# Patient Record
Sex: Female | Born: 1945
Health system: Southern US, Community
[De-identification: ages and names within clinical notes are randomized; demographics above are authoritative.]

## PROBLEM LIST (undated history)

## (undated) ENCOUNTER — Emergency Department (HOSPITAL_COMMUNITY): Admission: EM | Payer: Medicare PPO | Source: Home / Self Care

## (undated) DIAGNOSIS — J342 Deviated nasal septum: Secondary | ICD-10-CM

## (undated) DIAGNOSIS — M1990 Unspecified osteoarthritis, unspecified site: Secondary | ICD-10-CM

## (undated) DIAGNOSIS — Z8679 Personal history of other diseases of the circulatory system: Secondary | ICD-10-CM

## (undated) DIAGNOSIS — I10 Essential (primary) hypertension: Secondary | ICD-10-CM

## (undated) DIAGNOSIS — Z803 Family history of malignant neoplasm of breast: Secondary | ICD-10-CM

## (undated) DIAGNOSIS — M1710 Unilateral primary osteoarthritis, unspecified knee: Secondary | ICD-10-CM

## (undated) DIAGNOSIS — G4733 Obstructive sleep apnea (adult) (pediatric): Secondary | ICD-10-CM

## (undated) DIAGNOSIS — Z8049 Family history of malignant neoplasm of other genital organs: Secondary | ICD-10-CM

## (undated) DIAGNOSIS — E039 Hypothyroidism, unspecified: Secondary | ICD-10-CM

## (undated) DIAGNOSIS — H9319 Tinnitus, unspecified ear: Secondary | ICD-10-CM

## (undated) DIAGNOSIS — R002 Palpitations: Secondary | ICD-10-CM

## (undated) DIAGNOSIS — I517 Cardiomegaly: Secondary | ICD-10-CM

## (undated) DIAGNOSIS — E785 Hyperlipidemia, unspecified: Secondary | ICD-10-CM

## (undated) DIAGNOSIS — G63 Polyneuropathy in diseases classified elsewhere: Principal | ICD-10-CM

## (undated) DIAGNOSIS — I341 Nonrheumatic mitral (valve) prolapse: Secondary | ICD-10-CM

## (undated) DIAGNOSIS — G629 Polyneuropathy, unspecified: Secondary | ICD-10-CM

## (undated) DIAGNOSIS — Z800 Family history of malignant neoplasm of digestive organs: Secondary | ICD-10-CM

## (undated) DIAGNOSIS — Z8042 Family history of malignant neoplasm of prostate: Secondary | ICD-10-CM

## (undated) DIAGNOSIS — Z8052 Family history of malignant neoplasm of bladder: Secondary | ICD-10-CM

## (undated) DIAGNOSIS — E119 Type 2 diabetes mellitus without complications: Secondary | ICD-10-CM

## (undated) DIAGNOSIS — E0500 Thyrotoxicosis with diffuse goiter without thyrotoxic crisis or storm: Secondary | ICD-10-CM

## (undated) DIAGNOSIS — F419 Anxiety disorder, unspecified: Secondary | ICD-10-CM

## (undated) DIAGNOSIS — Z808 Family history of malignant neoplasm of other organs or systems: Secondary | ICD-10-CM

## (undated) DIAGNOSIS — G479 Sleep disorder, unspecified: Secondary | ICD-10-CM

## (undated) DIAGNOSIS — IMO0001 Reserved for inherently not codable concepts without codable children: Secondary | ICD-10-CM

## (undated) DIAGNOSIS — R0789 Other chest pain: Secondary | ICD-10-CM

## (undated) DIAGNOSIS — E1140 Type 2 diabetes mellitus with diabetic neuropathy, unspecified: Secondary | ICD-10-CM

## (undated) DIAGNOSIS — Z8041 Family history of malignant neoplasm of ovary: Secondary | ICD-10-CM

## (undated) DIAGNOSIS — Z8719 Personal history of other diseases of the digestive system: Secondary | ICD-10-CM

## (undated) DIAGNOSIS — M179 Osteoarthritis of knee, unspecified: Secondary | ICD-10-CM

## (undated) HISTORY — DX: Family history of malignant neoplasm of prostate: Z80.42

## (undated) HISTORY — DX: Deviated nasal septum: J34.2

## (undated) HISTORY — DX: Hyperlipidemia, unspecified: E78.5

## (undated) HISTORY — DX: Thyrotoxicosis with diffuse goiter without thyrotoxic crisis or storm: E05.00

## (undated) HISTORY — DX: Essential (primary) hypertension: I10

## (undated) HISTORY — DX: Tinnitus, unspecified ear: H93.19

## (undated) HISTORY — DX: Personal history of other diseases of the circulatory system: Z86.79

## (undated) HISTORY — DX: Family history of malignant neoplasm of other organs or systems: Z80.8

## (undated) HISTORY — DX: Palpitations: R00.2

## (undated) HISTORY — DX: Sleep disorder, unspecified: G47.9

## (undated) HISTORY — PX: APPENDECTOMY: SHX54

## (undated) HISTORY — DX: Other chest pain: R07.89

## (undated) HISTORY — DX: Unilateral primary osteoarthritis, unspecified knee: M17.10

## (undated) HISTORY — DX: Hypothyroidism, unspecified: E03.9

## (undated) HISTORY — DX: Unspecified osteoarthritis, unspecified site: M19.90

## (undated) HISTORY — DX: Family history of malignant neoplasm of breast: Z80.3

## (undated) HISTORY — PX: OTHER SURGICAL HISTORY: SHX169

## (undated) HISTORY — PX: CARPAL TUNNEL RELEASE: SHX101

## (undated) HISTORY — PX: ABDOMINAL HYSTERECTOMY: SHX81

## (undated) HISTORY — DX: Obstructive sleep apnea (adult) (pediatric): G47.33

## (undated) HISTORY — DX: Osteoarthritis of knee, unspecified: M17.9

## (undated) HISTORY — DX: Type 2 diabetes mellitus with diabetic neuropathy, unspecified: E11.40

## (undated) HISTORY — DX: Family history of malignant neoplasm of ovary: Z80.41

## (undated) HISTORY — DX: Polyneuropathy, unspecified: G62.9

## (undated) HISTORY — DX: Nonrheumatic mitral (valve) prolapse: I34.1

## (undated) HISTORY — DX: Reserved for inherently not codable concepts without codable children: IMO0001

## (undated) HISTORY — DX: Type 2 diabetes mellitus without complications: E11.9

## (undated) HISTORY — DX: Anxiety disorder, unspecified: F41.9

## (undated) HISTORY — DX: Personal history of other diseases of the digestive system: Z87.19

## (undated) HISTORY — DX: Cardiomegaly: I51.7

## (undated) HISTORY — DX: Polyneuropathy in diseases classified elsewhere: G63

## (undated) HISTORY — DX: Family history of malignant neoplasm of digestive organs: Z80.0

## (undated) HISTORY — DX: Family history of malignant neoplasm of other genital organs: Z80.49

## (undated) HISTORY — DX: Family history of malignant neoplasm of bladder: Z80.52

---
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
## 1987-08-14 HISTORY — PX: APPENDECTOMY: SHX54

## 1998-06-23 ENCOUNTER — Other Ambulatory Visit: Admission: RE | Admit: 1998-06-23 | Discharge: 1998-06-23 | Payer: Self-pay | Admitting: *Deleted

## 1998-08-13 HISTORY — PX: ABDOMINAL HYSTERECTOMY: SHX81

## 1998-10-07 ENCOUNTER — Other Ambulatory Visit: Admission: RE | Admit: 1998-10-07 | Discharge: 1998-10-07 | Payer: Self-pay | Admitting: *Deleted

## 1998-12-07 ENCOUNTER — Observation Stay (HOSPITAL_COMMUNITY): Admission: RE | Admit: 1998-12-07 | Discharge: 1998-12-08 | Payer: Self-pay | Admitting: *Deleted

## 1999-06-26 ENCOUNTER — Other Ambulatory Visit: Admission: RE | Admit: 1999-06-26 | Discharge: 1999-06-26 | Payer: Self-pay | Admitting: *Deleted

## 2000-01-26 ENCOUNTER — Ambulatory Visit (HOSPITAL_COMMUNITY): Admission: RE | Admit: 2000-01-26 | Discharge: 2000-01-26 | Payer: Self-pay | Admitting: Gastroenterology

## 2000-07-22 ENCOUNTER — Other Ambulatory Visit: Admission: RE | Admit: 2000-07-22 | Discharge: 2000-07-22 | Payer: Self-pay | Admitting: *Deleted

## 2000-08-13 HISTORY — PX: CARPAL TUNNEL RELEASE: SHX101

## 2001-08-12 ENCOUNTER — Other Ambulatory Visit: Admission: RE | Admit: 2001-08-12 | Discharge: 2001-08-12 | Payer: Self-pay | Admitting: *Deleted

## 2002-10-06 ENCOUNTER — Other Ambulatory Visit: Admission: RE | Admit: 2002-10-06 | Discharge: 2002-10-06 | Payer: Self-pay | Admitting: *Deleted

## 2004-03-15 ENCOUNTER — Other Ambulatory Visit: Admission: RE | Admit: 2004-03-15 | Discharge: 2004-03-15 | Payer: Self-pay | Admitting: *Deleted

## 2005-03-22 ENCOUNTER — Other Ambulatory Visit: Admission: RE | Admit: 2005-03-22 | Discharge: 2005-03-22 | Payer: Self-pay | Admitting: *Deleted

## 2006-04-02 ENCOUNTER — Other Ambulatory Visit: Admission: RE | Admit: 2006-04-02 | Discharge: 2006-04-02 | Payer: Self-pay | Admitting: *Deleted

## 2007-04-15 ENCOUNTER — Other Ambulatory Visit: Admission: RE | Admit: 2007-04-15 | Discharge: 2007-04-15 | Payer: Self-pay | Admitting: *Deleted

## 2007-12-18 ENCOUNTER — Encounter: Admission: RE | Admit: 2007-12-18 | Discharge: 2007-12-18 | Payer: Self-pay | Admitting: Neurosurgery

## 2008-05-04 ENCOUNTER — Other Ambulatory Visit: Admission: RE | Admit: 2008-05-04 | Discharge: 2008-05-04 | Payer: Self-pay | Admitting: Gynecology

## 2008-09-07 ENCOUNTER — Emergency Department (HOSPITAL_COMMUNITY): Admission: EM | Admit: 2008-09-07 | Discharge: 2008-09-07 | Payer: Self-pay | Admitting: Emergency Medicine

## 2009-05-11 ENCOUNTER — Encounter: Admission: RE | Admit: 2009-05-11 | Discharge: 2009-05-11 | Payer: Self-pay | Admitting: Cardiology

## 2009-08-13 DIAGNOSIS — IMO0001 Reserved for inherently not codable concepts without codable children: Secondary | ICD-10-CM

## 2009-08-13 HISTORY — DX: Reserved for inherently not codable concepts without codable children: IMO0001

## 2009-11-01 HISTORY — PX: TRANSTHORACIC ECHOCARDIOGRAM: SHX275

## 2009-11-09 ENCOUNTER — Encounter: Payer: Self-pay | Admitting: Cardiology

## 2009-11-09 HISTORY — PX: CARDIOVASCULAR STRESS TEST: SHX262

## 2009-11-10 ENCOUNTER — Encounter: Admission: RE | Admit: 2009-11-10 | Discharge: 2009-11-10 | Payer: Self-pay | Admitting: Cardiology

## 2009-11-23 ENCOUNTER — Encounter: Admission: RE | Admit: 2009-11-23 | Discharge: 2009-11-23 | Payer: Self-pay | Admitting: General Surgery

## 2010-05-23 ENCOUNTER — Ambulatory Visit: Payer: Self-pay | Admitting: Cardiology

## 2010-09-26 ENCOUNTER — Other Ambulatory Visit (INDEPENDENT_AMBULATORY_CARE_PROVIDER_SITE_OTHER): Payer: BC Managed Care – PPO

## 2010-09-26 DIAGNOSIS — E7800 Pure hypercholesterolemia, unspecified: Secondary | ICD-10-CM

## 2010-09-26 DIAGNOSIS — R079 Chest pain, unspecified: Secondary | ICD-10-CM

## 2010-09-26 DIAGNOSIS — I10 Essential (primary) hypertension: Secondary | ICD-10-CM

## 2010-09-29 ENCOUNTER — Ambulatory Visit (INDEPENDENT_AMBULATORY_CARE_PROVIDER_SITE_OTHER): Payer: BC Managed Care – PPO | Admitting: Cardiology

## 2010-09-29 DIAGNOSIS — E7800 Pure hypercholesterolemia, unspecified: Secondary | ICD-10-CM

## 2010-09-29 DIAGNOSIS — Z79899 Other long term (current) drug therapy: Secondary | ICD-10-CM

## 2010-09-29 DIAGNOSIS — E039 Hypothyroidism, unspecified: Secondary | ICD-10-CM

## 2010-11-08 ENCOUNTER — Other Ambulatory Visit: Payer: Self-pay | Admitting: Cardiology

## 2010-11-08 DIAGNOSIS — F419 Anxiety disorder, unspecified: Secondary | ICD-10-CM

## 2010-11-09 NOTE — Telephone Encounter (Signed)
escribe request  

## 2010-11-09 NOTE — Telephone Encounter (Signed)
[facility] °

## 2010-12-29 NOTE — Procedures (Signed)
[facility]  Patient:    Yvonne [name], Yvonne [name]                     MRN: [id number] Proc. Date: 01/26/00 Adm. Date:  [id number] Disc. Date: [id number] Attending:  Louie Bun CC:         Clovis Pu Patty Sermons, M.D.                           Procedure Report  PROCEDURE:  Colonoscopy.  INDICATION FOR PROCEDURE:  Recent onset of rectal bleeding in a 65 year old patient with no prior colon screening.  DESCRIPTION OF PROCEDURE:  The patient was placed in the left lateral decubitus position and placed on the pulse monitor with continuous low flow oxygen delivered by nasal cannula. She was sedated with 70 mg IV Demerol and 7 mg IV Versed. The Olympus video colonoscope was inserted into the rectum and advanced to the cecum, confirmed by transillumination of McBurneys point and visualization of the ileocecal valve and appendiceal orifice. The prep was excellent. The cecum and ascending colon appeared normal. Within the transverse colon, there was an apparent polyp approximately 1 x 0.5 cm in diameter with overlying mucosa appearing similar to this surrounding mucosa and not clearly an adenoma by endoscopic inspection. It was fulgurated by hot biopsy. The remainder of the transverse, descending, sigmoid and rectum appeared normal with no further masses, polyps, diverticula or other mucosal abnormalities. Retroflexed view of the anus revealed no obviously enlarged internal hemorrhoids. The colonoscope was then withdrawn and the patient returned to the recovery room in stable condition. The patient tolerated the procedure well and there were no immediate complications.  IMPRESSION:  Small transverse colon polyp otherwise normal colonoscopy.  PLAN:  Await histology for determination of interval and method for future colon screening. DD:  01/26/00 TD:  01/30/00 Job: [id number]

## 2011-01-19 ENCOUNTER — Other Ambulatory Visit: Payer: Self-pay | Admitting: *Deleted

## 2011-01-19 DIAGNOSIS — I10 Essential (primary) hypertension: Secondary | ICD-10-CM

## 2011-01-19 DIAGNOSIS — E039 Hypothyroidism, unspecified: Secondary | ICD-10-CM

## 2011-01-19 DIAGNOSIS — E785 Hyperlipidemia, unspecified: Secondary | ICD-10-CM

## 2011-01-19 DIAGNOSIS — Z79899 Other long term (current) drug therapy: Secondary | ICD-10-CM

## 2011-01-30 ENCOUNTER — Other Ambulatory Visit (INDEPENDENT_AMBULATORY_CARE_PROVIDER_SITE_OTHER): Payer: BC Managed Care – PPO | Admitting: *Deleted

## 2011-01-30 DIAGNOSIS — E559 Vitamin D deficiency, unspecified: Secondary | ICD-10-CM

## 2011-01-30 DIAGNOSIS — E039 Hypothyroidism, unspecified: Secondary | ICD-10-CM

## 2011-01-30 DIAGNOSIS — I10 Essential (primary) hypertension: Secondary | ICD-10-CM

## 2011-01-30 DIAGNOSIS — E785 Hyperlipidemia, unspecified: Secondary | ICD-10-CM

## 2011-01-30 DIAGNOSIS — Z79899 Other long term (current) drug therapy: Secondary | ICD-10-CM

## 2011-01-30 LAB — BASIC METABOLIC PANEL
BUN: 10 mg/dL (ref 6–23)
CO2: 28 mEq/L (ref 19–32)
Calcium: 9.7 mg/dL (ref 8.4–10.5)
Chloride: 111 mEq/L (ref 96–112)
Creatinine, Ser: 0.7 mg/dL (ref 0.4–1.2)
Glucose, Bld: 99 mg/dL (ref 70–99)
Potassium: 4.2 mEq/L (ref 3.5–5.1)
Sodium: 146 mEq/L — ABNORMAL HIGH (ref 135–145)

## 2011-01-30 LAB — LIPID PANEL
HDL: 37.8 mg/dL — ABNORMAL LOW (ref >39.00)
VLDL: 21 mg/dL (ref 0.0–40.0)

## 2011-01-30 LAB — TSH: TSH: 0.35 u[IU]/mL (ref 0.35–5.50)

## 2011-01-31 ENCOUNTER — Encounter: Payer: Self-pay | Admitting: Cardiology

## 2011-02-05 ENCOUNTER — Ambulatory Visit (INDEPENDENT_AMBULATORY_CARE_PROVIDER_SITE_OTHER): Payer: BC Managed Care – PPO | Admitting: Cardiology

## 2011-02-05 ENCOUNTER — Encounter: Payer: Self-pay | Admitting: Cardiology

## 2011-02-05 DIAGNOSIS — E785 Hyperlipidemia, unspecified: Secondary | ICD-10-CM | POA: Insufficient documentation

## 2011-02-05 DIAGNOSIS — E039 Hypothyroidism, unspecified: Secondary | ICD-10-CM | POA: Insufficient documentation

## 2011-02-05 DIAGNOSIS — R002 Palpitations: Secondary | ICD-10-CM | POA: Insufficient documentation

## 2011-02-05 DIAGNOSIS — I119 Hypertensive heart disease without heart failure: Secondary | ICD-10-CM

## 2011-02-05 NOTE — Assessment & Plan Note (Signed)
The patient has a history of familial dyslipidemia.  She is presently on Crestor 10 mg daily.  She has not been experiencing anyMyalgias from the Crestor

## 2011-02-05 NOTE — Assessment & Plan Note (Signed)
The patient has a past history of palpitations.  She initially was felt to have possible mitral valve prolapse for many years but  A more recent echo has not confirmed this

## 2011-02-05 NOTE — Assessment & Plan Note (Signed)
The patient has a history of essential hypertension.  Her echocardiogram has shown left ventricular hypertrophy with diastolic dysfunction.  Her last echo was 11/01/09.  She has not been expressing any chest pain or shortness of breath.  She is walking regularly for exercise.  Her weight is down 2 pounds since last visit.

## 2011-02-05 NOTE — Progress Notes (Signed)
Yvonne [name] Date of Birth:  [birth year] [facility] / [facility] [address]   [address] [place], [place]  [address] [phone]           Fax   [phone]  History of Present Illness: This pleasant 65 year old woman is seen for a scheduled followup office visit.  She has a history of severe hypertension.  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 Evlyn Kanner.  She's had a history of occasional chest pressure but her most recent nuclear stress test on 11/09/09 showed no ischemia and her ejection fraction was 75%.  Her last echocardiogram on 11/01/09 showed normal left ventricular systolic function but she did have diastolic dysfunction as well as mild aortic sclerosis and trace mitral regurgitation and mild LVH.  Current Outpatient Prescriptions  Medication Sig Dispense Refill  . ALPRAZolam (XANAX) 0.25 MG tablet TAKE ONE TABLET BY MOUTH EVERY 8 HOURS AS NEEDED  90 tablet  3  . aspirin 81 MG tablet Take 81 mg by mouth daily.        Marland Kitchen CALCIUM-MAGNESIUM-ZINC PO Take by mouth 2 (two) times daily.        . Cholecalciferol (VITAMIN D) 2000 UNITS tablet Take 2,000 Units by mouth daily.        . fish oil-omega-3 fatty acids 1000 MG capsule Take 1,200 mg by mouth 2 (two) times daily.        . furosemide (LASIX) 20 MG tablet Take 20 mg by mouth as needed.        . gabapentin (NEURONTIN) 300 MG capsule Take 300 mg by mouth at bedtime.        . hydrochlorothiazide (,MICROZIDE/HYDRODIURIL,) 12.5 MG capsule Take 12.5 mg by mouth daily.        Marland Kitchen levothyroxine (SYNTHROID, LEVOTHROID) 112 MCG tablet Take 112 mcg by mouth daily. Taking one x 6 days and 1/2 one day      . metoprolol succinate (TOPROL-XL) 25 MG 24 hr tablet Take 12.5 mg by mouth daily.        . potassium chloride SA (K-DUR,KLOR-CON) 20 MEQ tablet Take 10 mEq by mouth  daily.        . rosuvastatin (CRESTOR) 10 MG tablet Take 10 mg by mouth daily.        Marland Kitchen DISCONTD: psyllium (METAMUCIL) 58.6 % powder Take 1 packet by mouth daily.          Allergies  Allergen Reactions  . Tobramycin     Patient Active Problem List  Diagnoses  . Benign hypertensive heart disease without heart failure  . Hyperlipidemia  . Hypothyroidism  . Palpitations    History  Smoking status  . Former Smoker  . Quit date: 01/30/1986  Smokeless tobacco  . Not on file    History  Alcohol Use No    Family History  Problem Relation Age of Onset  . Hypertension Mother   . Heart disease Mother   . Hypertension Father   . Heart disease Father   . Arthritis Father   . Diabetes Father     Review of Systems: Constitutional: no fever chills diaphoresis or fatigue or change in weight.  Head and neck: no hearing loss, no epistaxis, no photophobia or visual disturbance. Respiratory: No cough, shortness of breath or wheezing. Cardiovascular: No chest pain peripheral edema,  palpitations. Gastrointestinal: No abdominal distention, no abdominal pain, no change in bowel habits hematochezia or melena. Genitourinary: No dysuria, no frequency, no urgency, no nocturia. Musculoskeletal:No arthralgias, no back pain, no gait disturbance or myalgias. Neurological: No dizziness, no headaches, no numbness, no seizures, no syncope, no weakness, no tremors. Hematologic: No lymphadenopathy, no easy bruising. Psychiatric: No confusion, no hallucinations, no sleep disturbance.    Physical Exam: Filed Vitals:   02/05/11 1610  BP: 124/76  Pulse: 66  The general appearance feels a well-developed well-nourished woman in no distress.Pupils equal and reactive.   Extraocular Movements are full.  There is no scleral icterus.  The mouth and pharynx are normal.  The neck is supple.  The carotids reveal no bruits.  The jugular venous pressure is normal.  The thyroid is not enlarged.  There is no  lymphadenopathy.The chest is clear to percussion and auscultation. There are no rales or rhonchi. Expansion of the chest is symmetrical.  The breasts reveal no mass.The precordium is quiet.  The first heart sound is normal.  The second heart sound is physiologically split.  There is no murmur gallop rub or click.  There is no abnormal lift or heave. The rhythm is regular.  Careful auscultation reveals no click.The abdomen is soft and nontender. Bowel sounds are normal. The liver and spleen are not enlarged. There Are no abdominal masses. There are no bruits.The pedal pulses are good.  There is no phlebitis or edema.  There is no cyanosis or clubbing.Strength is normal and symmetrical in all extremities.  There is no lateralizing weakness.  There are no sensory deficits.The skin is warm and [name].  There is no rash.   Assessment / Plan: Continue same medication.  Decrease her daily vitamin D to just 1000 units a day.  Recheck in 4 months for followup office visit and lab work.

## 2011-03-15 ENCOUNTER — Other Ambulatory Visit: Payer: Self-pay | Admitting: *Deleted

## 2011-03-15 DIAGNOSIS — I10 Essential (primary) hypertension: Secondary | ICD-10-CM

## 2011-03-15 MED ORDER — POTASSIUM CHLORIDE CRYS ER 20 MEQ PO TBCR: 10.0000 meq | EXTENDED_RELEASE_TABLET | Freq: Every day | ORAL | Status: DC

## 2011-03-15 NOTE — Telephone Encounter (Signed)
Refilled potassium to [facility]

## 2011-03-26 ENCOUNTER — Other Ambulatory Visit: Payer: Self-pay | Admitting: *Deleted

## 2011-03-26 DIAGNOSIS — E7800 Pure hypercholesterolemia, unspecified: Secondary | ICD-10-CM

## 2011-04-03 ENCOUNTER — Other Ambulatory Visit: Payer: Self-pay | Admitting: Cardiology

## 2011-04-03 DIAGNOSIS — I119 Hypertensive heart disease without heart failure: Secondary | ICD-10-CM

## 2011-05-08 ENCOUNTER — Other Ambulatory Visit: Payer: Self-pay | Admitting: *Deleted

## 2011-05-08 DIAGNOSIS — E039 Hypothyroidism, unspecified: Secondary | ICD-10-CM

## 2011-05-08 NOTE — Progress Notes (Signed)
Requested by Dr Adrian Prince, [facility]

## 2011-05-11 ENCOUNTER — Encounter: Payer: Self-pay | Admitting: Cardiology

## 2011-05-22 ENCOUNTER — Other Ambulatory Visit: Payer: BC Managed Care – PPO | Admitting: *Deleted

## 2011-05-24 ENCOUNTER — Other Ambulatory Visit (INDEPENDENT_AMBULATORY_CARE_PROVIDER_SITE_OTHER): Payer: BC Managed Care – PPO

## 2011-05-24 DIAGNOSIS — E7800 Pure hypercholesterolemia, unspecified: Secondary | ICD-10-CM

## 2011-05-24 DIAGNOSIS — E039 Hypothyroidism, unspecified: Secondary | ICD-10-CM

## 2011-05-24 LAB — HEPATIC FUNCTION PANEL
ALT: 28 U/L (ref 0–35)
AST: 24 U/L (ref 0–37)
Albumin: 4.5 g/dL (ref 3.5–5.2)
Alkaline Phosphatase: 59 U/L (ref 39–117)
Bilirubin, Direct: 0.1 mg/dL (ref 0.0–0.3)

## 2011-05-24 LAB — LIPID PANEL
Triglycerides: 140 mg/dL (ref 0.0–149.0)
VLDL: 28 mg/dL (ref 0.0–40.0)

## 2011-05-24 LAB — BASIC METABOLIC PANEL
BUN: 13 mg/dL (ref 6–23)
Calcium: 9.4 mg/dL (ref 8.4–10.5)
Chloride: 101 mEq/L (ref 96–112)
Creatinine, Ser: 0.6 mg/dL (ref 0.4–1.2)
GFR: 118.61 mL/min (ref >60.00)

## 2011-05-24 LAB — TSH: TSH: 0.74 u[IU]/mL (ref 0.35–5.50)

## 2011-05-28 ENCOUNTER — Encounter: Payer: Self-pay | Admitting: Cardiology

## 2011-05-28 ENCOUNTER — Ambulatory Visit (INDEPENDENT_AMBULATORY_CARE_PROVIDER_SITE_OTHER): Payer: BC Managed Care – PPO | Admitting: Cardiology

## 2011-05-28 VITALS — BP 140/80 | HR 66 | Ht 67.0 in | Wt 171.0 lb

## 2011-05-28 DIAGNOSIS — I119 Hypertensive heart disease without heart failure: Secondary | ICD-10-CM

## 2011-05-28 DIAGNOSIS — E785 Hyperlipidemia, unspecified: Secondary | ICD-10-CM

## 2011-05-28 DIAGNOSIS — E039 Hypothyroidism, unspecified: Secondary | ICD-10-CM

## 2011-05-28 NOTE — Progress Notes (Signed)
Yvonne [name] Date of Birth:  09-26-1945 [facility] / [facility] [address]   [address] [place], [place]  [address] [phone]           Fax   [phone]  History of Present Illness: This pleasant 65 year old , Caucasian female is seen for a scheduled 3 month followup office visit.  She has a complex past medical history.  She's had a history of severe hypertension.  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  She has a history of hypothyroidism.  She's had a history of atypical chest pain and has had several nuclear stress test.  Most recently 11/09/09, which showed no ischemia.  Her ejection fraction is 75%.  Her last echocardiogram on 11/01/09 shows normal systolic function, but diastolic dysfunction, as well as mild aortic sclerosis, and mild LVH.  Patient has a history of postmenopausal symptoms.  She has a lot of hot flashes and night sweats.  She has been off hormone replacement therapy for the past 3 years.  She has had normal mammograms for the last several years and she is going to talk to her gynecologist about a trial of hormone replacement therapy for symptomatic relief of her hot flashes.  Is having a lot of problems with indigestion recently.  She's been taking calcium pills, which may be contributing to this.  Current Outpatient Prescriptions  Medication Sig Dispense Refill  . ALPRAZolam (XANAX) 0.25 MG tablet TAKE ONE TABLET BY MOUTH EVERY 8 HOURS AS NEEDED  90 tablet  3  . aspirin 81 MG tablet Take 81 mg by mouth daily.        Marland Kitchen CALCIUM-MAGNESIUM-ZINC PO Take by mouth 2 (two) times daily.        . Cholecalciferol (VITAMIN D) 2000 UNITS tablet Take 1,000 Units by mouth daily.       . fish oil-omega-3 fatty acids 1000 MG capsule Take 1,200 mg by mouth 2 (two) times daily.        . furosemide (LASIX) 20 MG tablet Take 1 tablet (20 mg total) by mouth daily as needed.  30  tablet  11  . gabapentin (NEURONTIN) 300 MG capsule Take 300 mg by mouth at bedtime.        . hydrochlorothiazide (,MICROZIDE/HYDRODIURIL,) 12.5 MG capsule Take 12.5 mg by mouth daily.        Marland Kitchen levothyroxine (SYNTHROID, LEVOTHROID) 112 MCG tablet Take 112 mcg by mouth daily. Taking one x 6 days and 1/2 one day      . metoprolol succinate (TOPROL-XL) 25 MG 24 hr tablet Take 12.5 mg by mouth daily.        . potassium chloride SA (K-DUR,KLOR-CON) 20 MEQ tablet Take 0.5 tablets (10 mEq total) by mouth daily.  34 tablet  11  . rosuvastatin (CRESTOR) 10 MG tablet Take 10 mg by mouth daily.          Allergies  Allergen Reactions  . Tobramycin     Patient Active Problem List  Diagnoses  . Benign hypertensive heart disease without heart failure  . Hyperlipidemia  . Hypothyroidism  . Palpitations    History  Smoking status  . Former Smoker  . Quit date: 01/30/1986  Smokeless tobacco  . Not on file    History  Alcohol Use No    Family History  Problem Relation Age of Onset  . Hypertension Mother   . Heart  disease Mother   . Hypertension Father   . Heart disease Father   . Arthritis Father   . Diabetes Father     Review of Systems: Constitutional: no fever chills diaphoresis or fatigue or change in weight.  Head and neck: no hearing loss, no epistaxis, no photophobia or visual disturbance. Respiratory: No cough, shortness of breath or wheezing. Cardiovascular: No chest pain peripheral edema, palpitations. Gastrointestinal: No abdominal distention, no abdominal pain, no change in bowel habits hematochezia or melena. Genitourinary: No dysuria, no frequency, no urgency, no nocturia. Musculoskeletal:No arthralgias, no back pain, no gait disturbance or myalgias. Neurological: No dizziness, no headaches, no numbness, no seizures, no syncope, no weakness, no tremors. Hematologic: No lymphadenopathy, no easy bruising. Psychiatric: No confusion, no hallucinations, no sleep  disturbance.    Physical Exam: Filed Vitals:   05/28/11 1554  BP: 140/80  Pulse: 66   general appearance reveals a well-developed, well-nourished, middle-age woman, in no distress.Pupils equal and reactive.   Extraocular Movements are full.  There is no scleral icterus.  The mouth and pharynx are normal.  The neck is supple.  The carotids reveal no bruits.  The jugular venous pressure is normal.  The thyroid is not enlarged.  There is no lymphadenopathy.  The chest is clear to percussion and auscultation. There are no rales or rhonchi. Expansion of the chest is symmetrical.  The precordium is quiet.  The first heart sound is normal.  The second heart sound is physiologically split.  There is no murmur gallop rub or click.  There is no abnormal lift or heave.  Do not hear any click or murmur today.The abdomen is soft and nontender. Bowel sounds are normal. The liver and spleen are not enlarged. There Are no abdominal masses. There are no bruits.  The pedal pulses are good.  There is no phlebitis or edema.  There is no cyanosis or clubbing. Strength is normal and symmetrical in all extremities.  There is no lateralizing weakness.  There are no sensory deficits.  The skin is warm and [name].  There is no rash.      Assessment / Plan:  Continue same medication, but needs to work harder on weight loss and exercise.  Recheck in 4 months for followup office visit and lab work.  She is also looking into getting a primary care provider, possibly at [facility]

## 2011-05-28 NOTE — Assessment & Plan Note (Signed)
Patient is clinically euthyroid.  Her recent TSH is normal.  She will continue same meds

## 2011-05-28 NOTE — Assessment & Plan Note (Signed)
She has been having some blood pressure spikes, with systolics up to 190.  These occur without warning.  They are not necessarily related to exertion or any specific emotional stress.  Her systolic hypertension does respond to resting and taking a Xanax.  She has not been getting any regular aerobic exercise and this may be contributing to her labile hypertension.  She has also gained 3 pounds since last visit

## 2011-05-28 NOTE — Assessment & Plan Note (Signed)
Patient has a history of hyperlipidemia.  We reviewed her recent labs, which were not as good, but reflect her uneven diet and her lack of exercise and her weight gain.

## 2011-05-28 NOTE — Patient Instructions (Addendum)
continue same dose of medications  Work harder on diet and exercise  Your physician wants you to follow-up in: 4 months You will receive a reminder letter in the mail two months in advance. If you don't receive a letter, please call our office to schedule the follow-up appointment.

## 2011-06-05 ENCOUNTER — Telehealth: Payer: Self-pay | Admitting: Cardiology

## 2011-06-05 NOTE — Telephone Encounter (Signed)
Should be ok

## 2011-06-05 NOTE — Telephone Encounter (Signed)
Advised ok  

## 2011-06-05 NOTE — Telephone Encounter (Signed)
Pt calling re Biotin, vitamin supplement for skin and hair,is it ok to take? pls  [phone]

## 2011-06-05 NOTE — Telephone Encounter (Signed)
Is this ok?

## 2011-06-20 ENCOUNTER — Telehealth: Payer: Self-pay | Admitting: Cardiology

## 2011-06-20 DIAGNOSIS — J069 Acute upper respiratory infection, unspecified: Secondary | ICD-10-CM

## 2011-06-20 MED ORDER — AZITHROMYCIN 250 MG PO TABS
ORAL_TABLET | ORAL | Status: AC
Start: 2011-06-20 — End: 2011-06-25

## 2011-06-20 NOTE — Telephone Encounter (Signed)
Pt calling c/o sinus and bronchial infection, since last Thursday and would like to speak to [place]. Please return pt call.

## 2011-06-20 NOTE — Telephone Encounter (Signed)
Add a Z-Pak as directed 

## 2011-06-20 NOTE — Telephone Encounter (Signed)
Head and chest congestion and is now in chest.  Started Friday.  Doesn't tolerate Mucinex.  Uses [facility] on [address].  Would like something called in please advise

## 2011-06-20 NOTE — Telephone Encounter (Signed)
Advised patient

## 2011-07-02 ENCOUNTER — Telehealth: Payer: Self-pay | Admitting: Cardiology

## 2011-07-02 NOTE — Telephone Encounter (Signed)
Advised patient and she will call when she gets results

## 2011-07-02 NOTE — Telephone Encounter (Signed)
New mess:  Just let OBGYN and blood count is 11.7.  They told her to call and let you know. Please call patient.

## 2011-07-02 NOTE — Telephone Encounter (Signed)
Is trying to get PCP and has not seen yet.  Last HGB was 13 last year.  Did get some stool cards to check.  Has not really noticed being more tired than normal.  Please advise

## 2011-07-02 NOTE — Telephone Encounter (Signed)
She should go ahead and do the stool cards and get the report back from her gynecologist as soon as possible about the stool cards.  If stool cards are negative she can take some over-the-counter iron therapy.  If stool cards are positive she will need GI evaluation.

## 2011-07-13 ENCOUNTER — Telehealth: Payer: Self-pay | Admitting: Cardiology

## 2011-07-13 NOTE — Telephone Encounter (Signed)
Advised and scheduled CBC

## 2011-07-13 NOTE — Telephone Encounter (Signed)
New Msg: Pt calling stating that pt did colon test and there was no significant blood in the stool and pt wants to know if MD wants pt to take iron if so how much and if MD wants to recheck pt hemoglobin. Please return pt call to discuss further.

## 2011-07-13 NOTE — Telephone Encounter (Signed)
She should take an over-the-counter iron pill once a day for the next 6 weeks and then come in for a followup CBC to check her hemoglobin again

## 2011-07-13 NOTE — Telephone Encounter (Signed)
Hgb at GYN 11.7 and last year 13.  Was advised to call back with this information. Please advise

## 2011-07-30 ENCOUNTER — Ambulatory Visit (INDEPENDENT_AMBULATORY_CARE_PROVIDER_SITE_OTHER): Payer: BC Managed Care – PPO

## 2011-07-30 DIAGNOSIS — J069 Acute upper respiratory infection, unspecified: Secondary | ICD-10-CM

## 2011-07-30 DIAGNOSIS — J40 Bronchitis, not specified as acute or chronic: Secondary | ICD-10-CM

## 2011-08-16 ENCOUNTER — Telehealth: Payer: Self-pay | Admitting: Cardiology

## 2011-08-16 NOTE — Telephone Encounter (Signed)
Patient saw dermatologist today and she does have hives.  He recommended her to take a Zyrtec daily and for 7 days after they are gone.  Also recommended Benedryl. Was advised to let  Dr. Patty Sermons know.  Will forward to him to see if he wants to make any medication changes

## 2011-08-16 NOTE — Telephone Encounter (Signed)
Left message for patient to call.

## 2011-08-16 NOTE — Telephone Encounter (Signed)
New Msg: Pt calling stating that she believes she has had an allergic reaction to a medication pt is taking. Pt said she has whelps all over her backside; pt said she noticed them this morning. Pt said she has not done anything differently other than starting to take a iron supplement (since November) and sleeping on a new pillow. Pt said backside itches and pt is nauseas. Please return pt call to discuss further.

## 2011-08-17 NOTE — Telephone Encounter (Signed)
Continue present medications. 

## 2011-08-17 NOTE — Telephone Encounter (Signed)
Left message to continue current medications and call us back with update

## 2011-08-24 ENCOUNTER — Other Ambulatory Visit: Payer: BC Managed Care – PPO | Admitting: *Deleted

## 2011-08-24 ENCOUNTER — Ambulatory Visit: Payer: BC Managed Care – PPO

## 2011-08-24 DIAGNOSIS — D649 Anemia, unspecified: Secondary | ICD-10-CM

## 2011-08-24 LAB — CBC WITH DIFFERENTIAL/PLATELET
Basophils Relative: 0.3 % (ref 0.0–3.0)
Eosinophils Absolute: 0.3 10*3/uL (ref 0.0–0.7)
Eosinophils Relative: 6 % — ABNORMAL HIGH (ref 0.0–5.0)
HCT: 40.2 % (ref 36.0–46.0)
Lymphs Abs: 1.6 10*3/uL (ref 0.7–4.0)
MCV: 82.6 fl (ref 78.0–100.0)
Monocytes Absolute: 0.6 10*3/uL (ref 0.1–1.0)
Monocytes Relative: 10.3 % (ref 3.0–12.0)
Neutro Abs: 3.2 10*3/uL (ref 1.4–7.7)
Platelets: 263 10*3/uL (ref 150.0–400.0)

## 2011-08-25 ENCOUNTER — Other Ambulatory Visit: Payer: Self-pay | Admitting: Cardiology

## 2011-08-28 ENCOUNTER — Telehealth: Payer: Self-pay | Admitting: *Deleted

## 2011-08-28 DIAGNOSIS — D649 Anemia, unspecified: Secondary | ICD-10-CM

## 2011-08-28 NOTE — Telephone Encounter (Signed)
Advised of labs. Patient wants to stop iron and recheck CBC at next ov. Order put in system.  She is also going to find out if Dr Evlyn Kanner wants t4 free or regular and same with t3

## 2011-08-28 NOTE — Telephone Encounter (Signed)
Message copied by Burnell Blanks on Tue Aug 28, 2011  2:36 PM ------      Message from: Cassell Clement      Created: Fri Aug 24, 2011  1:52 PM       Please report.  CBC normal.

## 2011-08-31 ENCOUNTER — Telehealth: Payer: Self-pay | Admitting: Cardiology

## 2011-08-31 NOTE — Telephone Encounter (Signed)
New Problem   Patient calling back to report info to nurse   "Dr. Evlyn Kanner just requests TSH"  Patient can be reached at hm# if there are any further questions

## 2011-08-31 NOTE — Telephone Encounter (Signed)
Discussed with patient and TSH ordered in system

## 2011-09-18 ENCOUNTER — Other Ambulatory Visit: Payer: Self-pay | Admitting: Cardiology

## 2011-10-03 ENCOUNTER — Ambulatory Visit: Payer: BC Managed Care – PPO | Admitting: Cardiology

## 2011-10-08 ENCOUNTER — Other Ambulatory Visit: Payer: Self-pay | Admitting: *Deleted

## 2011-10-08 ENCOUNTER — Other Ambulatory Visit (INDEPENDENT_AMBULATORY_CARE_PROVIDER_SITE_OTHER): Payer: BC Managed Care – PPO

## 2011-10-08 ENCOUNTER — Other Ambulatory Visit: Payer: BC Managed Care – PPO

## 2011-10-08 DIAGNOSIS — E039 Hypothyroidism, unspecified: Secondary | ICD-10-CM

## 2011-10-08 DIAGNOSIS — I119 Hypertensive heart disease without heart failure: Secondary | ICD-10-CM

## 2011-10-08 DIAGNOSIS — D649 Anemia, unspecified: Secondary | ICD-10-CM

## 2011-10-08 LAB — CBC WITH DIFFERENTIAL/PLATELET
Eosinophils Relative: 5.5 % — ABNORMAL HIGH (ref 0.0–5.0)
HCT: 43.8 % (ref 36.0–46.0)
Hemoglobin: 15 g/dL (ref 12.0–15.0)
Lymphs Abs: 1.8 10*3/uL (ref 0.7–4.0)
Monocytes Relative: 10.1 % (ref 3.0–12.0)
Neutro Abs: 3.1 10*3/uL (ref 1.4–7.7)
WBC: 5.8 10*3/uL (ref 4.5–10.5)

## 2011-10-08 LAB — TSH: TSH: 1.67 u[IU]/mL (ref 0.35–5.50)

## 2011-10-08 LAB — LIPID PANEL
Cholesterol: 153 mg/dL (ref 0–200)
LDL Cholesterol: 81 mg/dL (ref 0–99)
Triglycerides: 176 mg/dL — ABNORMAL HIGH (ref 0.0–149.0)
VLDL: 35.2 mg/dL (ref 0.0–40.0)

## 2011-10-08 LAB — HEPATIC FUNCTION PANEL
Albumin: 4.5 g/dL (ref 3.5–5.2)
Total Protein: 7.4 g/dL (ref 6.0–8.3)

## 2011-10-08 LAB — BASIC METABOLIC PANEL
Chloride: 106 mEq/L (ref 96–112)
Potassium: 4 mEq/L (ref 3.5–5.1)

## 2011-10-08 NOTE — Progress Notes (Signed)
Quick Note:  Please make copy of labs for patient visit. ______ 

## 2011-10-09 LAB — VITAMIN D 25 HYDROXY (VIT D DEFICIENCY, FRACTURES): Vit D, 25-Hydroxy: 55 ng/mL (ref 30–89)

## 2011-10-10 ENCOUNTER — Encounter: Payer: Self-pay | Admitting: Cardiology

## 2011-10-10 ENCOUNTER — Ambulatory Visit (INDEPENDENT_AMBULATORY_CARE_PROVIDER_SITE_OTHER): Payer: BC Managed Care – PPO | Admitting: Cardiology

## 2011-10-10 VITALS — BP 140/88 | HR 83 | Ht 67.0 in | Wt 173.0 lb

## 2011-10-10 DIAGNOSIS — E039 Hypothyroidism, unspecified: Secondary | ICD-10-CM

## 2011-10-10 DIAGNOSIS — R002 Palpitations: Secondary | ICD-10-CM

## 2011-10-10 DIAGNOSIS — E785 Hyperlipidemia, unspecified: Secondary | ICD-10-CM

## 2011-10-10 DIAGNOSIS — I119 Hypertensive heart disease without heart failure: Secondary | ICD-10-CM

## 2011-10-10 DIAGNOSIS — E7800 Pure hypercholesterolemia, unspecified: Secondary | ICD-10-CM

## 2011-10-10 NOTE — Progress Notes (Signed)
Yvonne [name] Date of Birth:  [birth year] [facility] [address] [place], [place]  [address] [phone]         Fax   [phone]  History of Present Illness: This pleasant 66 year old woman is seen for a scheduled four-month followup office visit.  He has a past history of essential hypertension and history of hypercholesterolemia and hypothyroidism.  He is medically retired from a [employer].  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  Since last visit the patient has been doing well.  She has not been experiencing any chest pain or shortness of breath.  She is discouraged she has not been able to lose weight.  She does not sleep well at night.  She uses him at bedtime.  She normally does not sleep more than 5 hours.  She takes Neurontin for peripheral neuropathy at bedtime and sometimes also takes Xanax.  Current Outpatient Prescriptions  Medication Sig Dispense Refill  . ALPRAZolam (XANAX) 0.25 MG tablet TAKE ONE TABLET BY MOUTH EVERY 8 HOURS AS NEEDED  90 tablet  3  . aspirin 81 MG tablet Take 81 mg by mouth daily.        Marland Kitchen CALCIUM-MAGNESIUM-ZINC PO Take by mouth 2 (two) times daily. As needed      . Cholecalciferol (VITAMIN D) 2000 UNITS tablet Take 2,000 Units by mouth daily.       . fish oil-omega-3 fatty acids 1000 MG capsule Take 1,200 mg by mouth 2 (two) times daily. Takes occ.      . furosemide (LASIX) 20 MG tablet Take 1 tablet (20 mg total) by mouth daily as needed.  30 tablet  11  . gabapentin (NEURONTIN) 300 MG capsule       . hydrochlorothiazide (,MICROZIDE/HYDRODIURIL,) 12.5 MG capsule Take 12.5 mg by mouth daily.        Marland Kitchen levothyroxine (SYNTHROID, LEVOTHROID) 112 MCG tablet Take 112 mcg by mouth daily. Taking one x 6 days and 1/2 one day      . metoprolol succinate (TOPROL-XL) 25 MG 24 hr tablet Take 12.5 mg by mouth daily.        . potassium chloride SA (K-DUR,KLOR-CON) 20 MEQ  tablet Take 0.5 tablets (10 mEq total) by mouth daily.  34 tablet  11  . rosuvastatin (CRESTOR) 10 MG tablet Take 10 mg by mouth daily.        . fluticasone (FLONASE) 50 MCG/ACT nasal spray as directed.      Marland Kitchen DISCONTD: rosuvastatin (CRESTOR) 20 MG tablet Take 1 tablet (20 mg total) by mouth daily.  90 tablet  3    Allergies  Allergen Reactions  . Tobramycin     Patient Active Problem List  Diagnoses  . Benign hypertensive heart disease without heart failure  . Hyperlipidemia  . Hypothyroidism  . Palpitations    History  Smoking status  . Former Smoker  . Quit date: 01/30/1986  Smokeless tobacco  . Not on file    History  Alcohol Use No    Family History  Problem Relation Age of Onset  . Hypertension Mother   . Heart disease Mother   . Hypertension Father   . Heart disease Father   . Arthritis Father   . Diabetes Father     Review of Systems: Constitutional: no fever chills diaphoresis or fatigue or change in weight.  Head and neck: no hearing loss, no epistaxis, no photophobia  or visual disturbance. Respiratory: No cough, shortness of breath or wheezing. Cardiovascular: No chest pain peripheral edema, palpitations. Gastrointestinal: No abdominal distention, no abdominal pain, no change in bowel habits hematochezia or melena. Genitourinary: No dysuria, no frequency, no urgency, no nocturia. Musculoskeletal:No arthralgias, no back pain, no gait disturbance or myalgias. Neurological: No dizziness, no headaches, no numbness, no seizures, no syncope, no weakness, no tremors. Hematologic: No lymphadenopathy, no easy bruising. Psychiatric: No confusion, no hallucinations, no sleep disturbance.    Physical Exam: Filed Vitals:   10/10/11 1642  BP: 140/88  Pulse: 83   the general appearance reveals a well-developed well-nourished Caucasian female in no distress.The head and neck exam reveals pupils equal and reactive.  Extraocular movements are full.  There is no  scleral icterus.  The mouth and pharynx are normal.  The neck is supple.  The carotids reveal no bruits.  The jugular venous pressure is normal.  The  thyroid is not enlarged.  There is no lymphadenopathy.  The chest is clear to percussion and auscultation.  There are no rales or rhonchi.  Expansion of the chest is symmetrical.  The precordium is quiet.  The first heart sound is normal.  The second heart sound is physiologically split.  There is no murmur gallop rub or click.  There is no abnormal lift or heave.  The abdomen is soft and nontender.  The bowel sounds are normal.  The liver and spleen are not enlarged.  There are no abdominal masses.  There are no abdominal bruits.  Extremities reveal good pedal pulses.  There is no phlebitis or edema.  There is no cyanosis or clubbing.  Strength is normal and symmetrical in all extremities.  There is no lateralizing weakness.  There are no sensory deficits.  The skin is warm and [name].  There is no rash.     Assessment / Plan: I went over her labs together with her today.  He worked hard on her diet and continue same medication.  We will plan to recheck her in 6 months for a followup office visit and fasting lab work.

## 2011-10-10 NOTE — Patient Instructions (Signed)
Your physician recommends that you continue on your current medications as directed. Please refer to the Current Medication list given to you today.  Your physician wants you to follow-up in: 6 month. You will receive a reminder letter in the mail two months in advance. If you don't receive a letter, please call our office to schedule the follow-up appointment.  

## 2011-10-10 NOTE — Assessment & Plan Note (Signed)
The patient has a history of hypothyroidism and is on Synthroid.  She is clinically euthyroid and her recent blood work confirms this

## 2011-10-10 NOTE — Assessment & Plan Note (Signed)
Patient has a history of dyslipidemia with elevated triglycerides and low HDL.  We talked about the importance of weight loss and low carbohydrate diet in reversing the abnormalities.

## 2011-10-10 NOTE — Progress Notes (Signed)
Quick Note:  Please make copy of labs for patient visit. ______ 

## 2011-10-10 NOTE — Assessment & Plan Note (Signed)
She has not been experiencing any exertional chest pain.  She has not been exercising regularly and her weight is up 2 pounds.  She notes that her blood pressure remains quite labile and goes up under emotional stress quite quickly

## 2011-10-20 ENCOUNTER — Other Ambulatory Visit: Payer: Self-pay | Admitting: Cardiology

## 2011-10-22 NOTE — Telephone Encounter (Signed)
Refilled hctz 

## 2011-12-17 ENCOUNTER — Other Ambulatory Visit: Payer: Self-pay | Admitting: Cardiology

## 2011-12-17 NOTE — Telephone Encounter (Signed)
Refilled metoprolol 

## 2011-12-28 ENCOUNTER — Telehealth: Payer: Self-pay | Admitting: Cardiology

## 2011-12-28 ENCOUNTER — Ambulatory Visit (INDEPENDENT_AMBULATORY_CARE_PROVIDER_SITE_OTHER): Payer: BC Managed Care – PPO | Admitting: Nurse Practitioner

## 2011-12-28 ENCOUNTER — Encounter: Payer: Self-pay | Admitting: Nurse Practitioner

## 2011-12-28 DIAGNOSIS — R0789 Other chest pain: Secondary | ICD-10-CM

## 2011-12-28 DIAGNOSIS — R079 Chest pain, unspecified: Secondary | ICD-10-CM

## 2011-12-28 DIAGNOSIS — R9431 Abnormal electrocardiogram [ECG] [EKG]: Secondary | ICD-10-CM

## 2011-12-28 LAB — CK TOTAL AND CKMB (NOT AT ARMC)
CK, MB: 5.7 ng/mL — ABNORMAL HIGH (ref 0.3–4.0)
Relative Index: 2.9 — ABNORMAL HIGH (ref 0.0–2.5)
Total CK: 200 U/L — ABNORMAL HIGH (ref 7–177)

## 2011-12-28 LAB — TROPONIN I: Troponin I: <0.3 ng/mL (ref <0.30)

## 2011-12-28 MED ORDER — PANTOPRAZOLE SODIUM 40 MG PO TBEC: 40.0000 mg | DELAYED_RELEASE_TABLET | Freq: Every day | ORAL | Status: DC

## 2011-12-28 NOTE — Progress Notes (Signed)
Yvonne [name] Date of Birth: 29-Jul-1946 Medical Record #[id number]  History of Present Illness: Yvonne [name] is seen today for a work in visit. She is seen for Yvonne [name]. She does not have a history of known CAD. Last stress test was in 2011. Her problems include HTN, HLD, obesity, anxiety and peripheral neuropathy.   She comes in today. She is here alone. She hasn't felt well for the past week or so. She feels soreness in her chest and arm. It hurts to touch. She took some Ibuprofen. Last night had indigestion and her blood pressure was up. She had a sour taste in her mouth. Was belching and burping. Blood pressure typically around 130 to 140. No PPI therapy. She has not been exercising. Feels ok today.   Current Outpatient Prescriptions on File Prior to Visit  Medication Sig Dispense Refill  . ALPRAZolam (XANAX) 0.25 MG tablet TAKE ONE TABLET BY MOUTH EVERY 8 HOURS AS NEEDED  90 tablet  3  . aspirin 81 MG tablet Take 81 mg by mouth daily.        Marland Kitchen CALCIUM-MAGNESIUM-ZINC PO Take by mouth 2 (two) times daily. As needed      . Cholecalciferol (VITAMIN D) 2000 UNITS tablet Take 2,000 Units by mouth daily.       . fish oil-omega-3 fatty acids 1000 MG capsule Take 1,200 mg by mouth 2 (two) times daily. Takes occ.      . fluticasone (FLONASE) 50 MCG/ACT nasal spray as directed.      . furosemide (LASIX) 20 MG tablet Take 1 tablet (20 mg total) by mouth daily as needed.  30 tablet  11  . gabapentin (NEURONTIN) 300 MG capsule       . hydrochlorothiazide (MICROZIDE) 12.5 MG capsule TAKE 1 CAPSULE BY MOUTH EVERY DAY FOR BLOOD PRESSURE  90 capsule  PRN  . levothyroxine (SYNTHROID, LEVOTHROID) 112 MCG tablet Take 112 mcg by mouth daily. Taking one x 6 days and 1/2 one day      . metoprolol succinate (TOPROL-XL) 25 MG 24 hr tablet TAKE ONE TABLET BY MOUTH EVERY DAY OR AS DIRECTED  30 tablet  11  . potassium chloride SA (K-DUR,KLOR-CON) 20 MEQ tablet Take 0.5 tablets (10 mEq total) by mouth daily.   34 tablet  11  . rosuvastatin (CRESTOR) 10 MG tablet Take 10 mg by mouth daily.        . pantoprazole (PROTONIX) 40 MG tablet Take 1 tablet (40 mg total) by mouth daily.  30 tablet  11    Allergies  Allergen Reactions  . Erythromycin   . Tobramycin     Past Medical History  Diagnosis Date  . Hypertension   . Hyperlipidemia   . Hypothyroidism   . Heart palpitations   . LVH (left ventricular hypertrophy)     per echo in 2011  . History of diastolic dysfunction     per echo in 2011  . Anxiety   . Chest pressure   . Peripheral neuropathy   . Sleep difficulties   . Normal nuclear stress test 2011    Past Surgical History  Procedure Date  . Cardiovascular stress test 11/09/2009    EF 75%  . Transthoracic echocardiogram 11/01/2009    NORMAL LV FILLING AND DIASTOLIC DYSFUNCTION AND MILD AORTIC SCLEROSIS AND TRACE MITRAL REGURGITATION  . Appendectomy     History  Smoking status  . Former Smoker  . Quit date: 01/30/1986  Smokeless tobacco  . Not on file  History  Alcohol Use No    Family History  Problem Relation Age of Onset  . Hypertension Mother   . Heart disease Mother   . Hypertension Father   . Heart disease Father   . Arthritis Father   . Diabetes Father     Review of Systems: The review of systems is per the HPI. All other systems were reviewed and are negative.  Physical Exam: BP 140/94  Pulse 71  Ht 5' 7.5" (1.715 m)  Wt 176 lb (79.833 kg)  BMI 27.16 kg/m2 Patient is very pleasant and in no acute distress. She is anxious. Skin is warm and [name]. Color is normal.  HEENT is unremarkable. Normocephalic/atraumatic. PERRL. Sclera are nonicteric. Neck is supple. No masses. No JVD. Lungs are clear. Cardiac exam shows a regular rate and rhythm. She has palpable chest wall pain.  Abdomen is soft. Extremities are without edema. Gait and ROM are intact. No gross neurologic deficits noted.   LABORATORY DATA: EKG today shows sinus rhythm. She has inferior Q's  and T wave changes in AVL. No acute changes.    Assessment / Plan:

## 2011-12-28 NOTE — Telephone Encounter (Signed)
Patient called, stated she saw Norma Fredrickson NP today.States scheduled myoview 01/09/12 wanted to make sure that was not too far out.Also wants to know what are her activities.Spoke to Norma Fredrickson NP she advised to schedule myoview next week and just do her activities as tolerated.Patient was also given her lab results.

## 2011-12-28 NOTE — Patient Instructions (Signed)
We are going to update your stress test.  I am going to start you on Protonix 40 mg daily  We are going to check some heart enzymes today.   We will see you back in August unless your stress test is abnormal.  Call the [facility] office at [phone] if you have any questions, problems or concerns.

## 2011-12-28 NOTE — Telephone Encounter (Signed)
Please return call to patient at [phone]  Patient BP elevated and she has had chest discomfort over the last couple days.  She would like to be seen today.

## 2011-12-28 NOTE — Assessment & Plan Note (Signed)
Patient presents with atypical chest pain. Clearly has palpable chest wall pain on exam. Also reporting indigestion. Her EKG however is abnormal. We are going to check cardiac enzymes today. Will arrange for stress testing early next week. She is to not use Ibuprofen and take Tylenol instead. I have add Protonix 40 mg to her regimen as well. Further disposition to follow. She was asking if she could go out and start exercising. I have asked her to hold off until her stress test results are reviewed. Patient is agreeable to this plan and will call if any problems develop in the interim.

## 2011-12-28 NOTE — Progress Notes (Signed)
Addended by: Royanne Foots on: 12/28/2011 03:54 PM   Modules accepted: Orders

## 2011-12-28 NOTE — Telephone Encounter (Signed)
Pt was told EKG was abnormal, was told not to exercise but wants to know if any other restrictions, and wants lab results if available

## 2011-12-28 NOTE — Telephone Encounter (Signed)
Patient states she has had a lot of chest discomfort over the last few days and a lot of indigestion yesterday.  Last night her blood pressure went up to 180's/98 with her chest discomfort.  Seeing Lawson Fiscal NP this am

## 2011-12-31 ENCOUNTER — Telehealth: Payer: Self-pay | Admitting: Cardiology

## 2011-12-31 NOTE — Telephone Encounter (Signed)
New msg Pt was calling about her appt on Thursday for stress test She wanted to know about bcbs auth

## 2012-01-02 ENCOUNTER — Telehealth: Payer: Self-pay | Admitting: Cardiology

## 2012-01-02 NOTE — Telephone Encounter (Signed)
error 

## 2012-01-03 ENCOUNTER — Ambulatory Visit (HOSPITAL_COMMUNITY): Payer: BC Managed Care – PPO | Attending: Cardiology | Admitting: Radiology

## 2012-01-03 DIAGNOSIS — R079 Chest pain, unspecified: Secondary | ICD-10-CM | POA: Insufficient documentation

## 2012-01-03 DIAGNOSIS — R9431 Abnormal electrocardiogram [ECG] [EKG]: Secondary | ICD-10-CM | POA: Insufficient documentation

## 2012-01-03 MED ORDER — TECHNETIUM TC 99M TETROFOSMIN IV KIT
11.0000 | PACK | Freq: Once | INTRAVENOUS | Status: AC | PRN
  Administered 2012-01-03: 11 via INTRAVENOUS

## 2012-01-03 MED ORDER — TECHNETIUM TC 99M TETROFOSMIN IV KIT
33.0000 | PACK | Freq: Once | INTRAVENOUS | Status: AC | PRN
  Administered 2012-01-03: 33 via INTRAVENOUS

## 2012-01-03 NOTE — Progress Notes (Signed)
[facility] SITE 3 NUCLEAR MED [address] [place] [address] [phone]  Cardiology Nuclear Med Study  Yvonne [name] is a 66 y.o. female     MRN : [id number]     DOB: 1946/03/25  Procedure Date: 01/03/2012  Nuclear Med Background Indication for Stress Test:  Evaluation for Ischemia and Abnormal EKG History:  10/2009 ECHO: EF: 55-60%-MPS: EF: 75% NL Cardiac Risk Factors: Family History - CAD, History of Smoking, Hypertension and Lipids  Symptoms:  Chest Pain   Nuclear Pre-Procedure Caffeine/Decaff Intake:  None NPO After: 7:00am   Lungs:  clear O2 Sat: 97% on room air. IV 0.9% NS with Angio Cath:  20g  IV Site: R Forearm  IV Started by:  Stanton Kidney, EMT-P  Chest Size (in):  36 Cup Size: D  Height: 5' 7.5" (1.715 m)  Weight:  176 lb (79.833 kg)  BMI:  Body mass index is 27.16 kg/(m^2). Tech Comments:  Metoprolol held > 48 hours, per patient.    Nuclear Med Study 1 or 2 day study: 1 day  Stress Test Type:  Stress  Reading MD: Willa Rough, MD  Order Authorizing Provider:  Cassell Clement MD  Resting Radionuclide: Technetium 53m Tetrofosmin  Resting Radionuclide Dose: 11.0 mCi   Stress Radionuclide:  Technetium 16m Tetrofosmin  Stress Radionuclide Dose: 32.9 mCi           Stress Protocol Rest HR: 78 Stress HR: 142  Rest BP: 143/75 Stress BP: 185/68  Exercise Time (min): 8:00 METS: 10.00   Predicted Max HR: 157 bpm % Max HR: 90.45 bpm Rate Pressure Product: 21308   Dose of Adenosine (mg):  n/a Dose of Lexiscan: 0.4 mg  Dose of Atropine (mg): n/a Dose of Dobutamine: n/a mcg/kg/min (at max HR)  Stress Test Technologist: Milana Na, EMT-P  Nuclear Technologist:  Domenic Polite, CNMT     Rest Procedure:  Myocardial perfusion imaging was performed at rest 45 minutes following the intravenous administration of Technetium 26m Tetrofosmin. Rest ECG: NSR - Normal EKG  Stress Procedure:  The patient performed treadmill exercise using a Bruce   Protocol for 8:00 minutes. The patient stopped due to fatigue and denied any chest pain.  There were no significant ST-T wave changes and a rare pvc.  Technetium 30m Tetrofosmin was injected at peak exercise and myocardial perfusion imaging was performed after a brief delay. Stress ECG: No significant change from baseline ECG  QPS Raw Data Images:  Patient motion noted; appropriate software correction applied. Stress Images:  Normal homogeneous uptake in all areas of the myocardium. Rest Images:  Normal homogeneous uptake in all areas of the myocardium. Subtraction (SDS):  No evidence of ischemia. Transient Ischemic Dilatation (Normal <1.22):  0.99 Lung/Heart Ratio (Normal <0.45):  0.33  Quantitative Gated Spect Images QGS EDV:  54 ml QGS ESV:  12 ml  Impression Exercise Capacity:  Fair exercise capacity. BP Response:  Normal blood pressure response. Clinical Symptoms:  No symptoms. ECG Impression:  No significant ST segment change suggestive of ischemia. Comparison with Prior Nuclear Study: No significant change from previous study  Overall Impression:  Normal stress nuclear study.  LV Ejection Fraction: 77%.  LV Wall Motion:  Normal Wall Motion  Willa Rough, MD

## 2012-01-08 ENCOUNTER — Telehealth: Payer: Self-pay | Admitting: *Deleted

## 2012-01-08 NOTE — Telephone Encounter (Signed)
Message copied by Burnell Blanks on Tue Jan 08, 2012  1:55 PM ------      Message from: Cassell Clement      Created: Mon Jan 07, 2012  6:07 PM       Myoview was normal.  No blockage. Good LV function.

## 2012-01-08 NOTE — Telephone Encounter (Signed)
Message copied by Burnell Blanks on Tue Jan 08, 2012  1:47 PM ------      Message from: Cassell Clement      Created: Mon Jan 07, 2012  6:07 PM       Myoview was normal.  No blockage. Good LV function.

## 2012-01-08 NOTE — Telephone Encounter (Signed)
Patient wants to know what is different on her EKG at her office visit with Lawson Fiscal in comparison to her stress test.  Will discuss with  Dr. Patty Sermons tomorrow

## 2012-01-08 NOTE — Telephone Encounter (Signed)
Advised of stress test results 

## 2012-01-09 ENCOUNTER — Encounter (HOSPITAL_COMMUNITY): Payer: BC Managed Care – PPO

## 2012-01-09 ENCOUNTER — Encounter: Payer: Self-pay | Admitting: Cardiology

## 2012-01-11 NOTE — Telephone Encounter (Signed)
Discussed with stress test good and EKG's ok per report, nothing to be concerned with

## 2012-01-14 ENCOUNTER — Telehealth: Payer: Self-pay | Admitting: Cardiology

## 2012-01-14 NOTE — Telephone Encounter (Signed)
New Problem: ° ° ° °Patient returned your call.  Please call back. °

## 2012-01-14 NOTE — Telephone Encounter (Signed)
Follow up :  Patient calling stating someone called her this am. Patient aware that Juliette Alcide is off today.

## 2012-01-14 NOTE — Telephone Encounter (Signed)
Not sure who called pt. LMTCB for pt

## 2012-01-14 NOTE — Telephone Encounter (Signed)
Spoke with pt. Did not call pt this morning again (Did not call pt initially--pt not sure who called this morning. States it was about 8:15am.) Kandee Keen had already spoken with pt today and verify she had been notified of stress test results.

## 2012-03-04 ENCOUNTER — Other Ambulatory Visit: Payer: Self-pay | Admitting: Family Medicine

## 2012-03-04 NOTE — Telephone Encounter (Signed)
Pull chart

## 2012-03-26 ENCOUNTER — Telehealth: Payer: Self-pay | Admitting: Cardiology

## 2012-03-26 DIAGNOSIS — R7301 Impaired fasting glucose: Secondary | ICD-10-CM

## 2012-03-26 NOTE — Telephone Encounter (Signed)
Patient went to see a PCP and he found sugar in urine. He recommended her have an A1C with labs this month.  She states she has had a lot of weddings and birthdays this month and knows it will be up.  She just wanted to check and see what  Dr. Patty Sermons thought.  Will forward to  Dr. Patty Sermons for review.

## 2012-03-26 NOTE — Telephone Encounter (Signed)
Order in for A1C

## 2012-03-26 NOTE — Telephone Encounter (Signed)
Pt is having blood work on Friday and another provider wanted an a1c done and she feels that the labwork will not be good and she needs to know if you want this lab as well

## 2012-03-26 NOTE — Telephone Encounter (Signed)
Yes it would be good to get the A1c when she has her other lab work and we will take the weddings etc. in consideration when we interpret the results.

## 2012-03-28 ENCOUNTER — Other Ambulatory Visit (INDEPENDENT_AMBULATORY_CARE_PROVIDER_SITE_OTHER): Payer: BC Managed Care – PPO

## 2012-03-28 DIAGNOSIS — R7301 Impaired fasting glucose: Secondary | ICD-10-CM

## 2012-03-28 DIAGNOSIS — E7800 Pure hypercholesterolemia, unspecified: Secondary | ICD-10-CM

## 2012-03-28 DIAGNOSIS — I119 Hypertensive heart disease without heart failure: Secondary | ICD-10-CM

## 2012-03-28 LAB — HEPATIC FUNCTION PANEL
AST: 34 U/L (ref 0–37)
Total Bilirubin: 0.5 mg/dL (ref 0.3–1.2)

## 2012-03-28 LAB — BASIC METABOLIC PANEL
CO2: 30 mEq/L (ref 19–32)
Chloride: 104 mEq/L (ref 96–112)
Glucose, Bld: 107 mg/dL — ABNORMAL HIGH (ref 70–99)
Potassium: 3.9 mEq/L (ref 3.5–5.1)
Sodium: 142 mEq/L (ref 135–145)

## 2012-03-28 LAB — LIPID PANEL
HDL: 37.6 mg/dL — ABNORMAL LOW (ref >39.00)
LDL Cholesterol: 68 mg/dL (ref 0–99)
Total CHOL/HDL Ratio: 3
VLDL: 18.4 mg/dL (ref 0.0–40.0)

## 2012-03-28 NOTE — Progress Notes (Signed)
Quick Note:  Please make copy of labs for patient visit. ______ 

## 2012-03-31 ENCOUNTER — Other Ambulatory Visit: Payer: Self-pay | Admitting: Nurse Practitioner

## 2012-03-31 MED ORDER — PANTOPRAZOLE SODIUM 40 MG PO TBEC: 40.0000 mg | DELAYED_RELEASE_TABLET | Freq: Every day | ORAL | Status: DC

## 2012-04-01 ENCOUNTER — Encounter: Payer: Self-pay | Admitting: Cardiology

## 2012-04-01 ENCOUNTER — Ambulatory Visit (INDEPENDENT_AMBULATORY_CARE_PROVIDER_SITE_OTHER): Payer: BC Managed Care – PPO | Admitting: Cardiology

## 2012-04-01 VITALS — BP 128/67 | HR 73 | Resp 18 | Ht 67.0 in | Wt 174.0 lb

## 2012-04-01 DIAGNOSIS — Z8639 Personal history of other endocrine, nutritional and metabolic disease: Secondary | ICD-10-CM

## 2012-04-01 DIAGNOSIS — I119 Hypertensive heart disease without heart failure: Secondary | ICD-10-CM

## 2012-04-01 DIAGNOSIS — E7800 Pure hypercholesterolemia, unspecified: Secondary | ICD-10-CM

## 2012-04-01 DIAGNOSIS — E785 Hyperlipidemia, unspecified: Secondary | ICD-10-CM

## 2012-04-01 DIAGNOSIS — Z862 Personal history of diseases of the blood and blood-forming organs and certain disorders involving the immune mechanism: Secondary | ICD-10-CM

## 2012-04-01 NOTE — Assessment & Plan Note (Signed)
The patient has hyperlipidemia.  She has been on Crestor 10 mg daily.  She is concerned that she may be developing some early memory loss and she would like to be on the minimum dose of Crestor possible.  Since her lipids have improved with weight loss and exercise we will be able to reduce her Crestor dose to just 5 mg daily starting now.

## 2012-04-01 NOTE — Patient Instructions (Addendum)
Decrease your Crestor to 5 mg daily (1/2 of 10)  Your physician recommends that you schedule a follow-up appointment in: 4 months with fasting labs (lp/bmet/hfp/a1c)

## 2012-04-01 NOTE — Progress Notes (Signed)
Gae Dry Date of Birth:  05-09-1946 [facility] [address] [place], [place]  [address] [phone]         Fax   [phone]  History of Present Illness: This pleasant 66 year old woman is seen for a scheduled four-month followup office visit. He has a past history of essential hypertension and history of hypercholesterolemia and hypothyroidism.  She is medically retired from a [employer]. 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 Since last visit the patient has been doing well.  She was seen as a work in on 12/28/11 does have chest pain and indigestion.  She underwent a nuclear stress test which showed no evidence of ischemia and showed normal left ventricular systolic function.  She has a very strong history of vascular disease in her family.  Multiple cousins have had strokes and heart attacks.  Her own parents were both diabetic and had severe vascular disease.  The patient herself has a history of high blood pressure and hyperlipidemia and obesity.  She has had mild hyperglycemia in the past and her hemoglobin A1c now is 6.6   Current Outpatient Prescriptions  Medication Sig Dispense Refill  . ALPRAZolam (XANAX) 0.25 MG tablet TAKE ONE TABLET BY MOUTH EVERY 8 HOURS AS NEEDED  90 tablet  3  . aspirin 81 MG tablet Take 81 mg by mouth daily.        . Cholecalciferol (VITAMIN D) 2000 UNITS tablet Take 1,000 Units by mouth daily.       . fluticasone (FLONASE) 50 MCG/ACT nasal spray INSTILL TWO SPRAYS INTO EACH NOSTRIL ONCE A DAY  16 g  0  . furosemide (LASIX) 20 MG tablet Take 1 tablet (20 mg total) by mouth daily as needed.  30 tablet  11  . gabapentin (NEURONTIN) 300 MG capsule       . hydrochlorothiazide (MICROZIDE) 12.5 MG capsule TAKE 1 CAPSULE BY MOUTH EVERY DAY FOR BLOOD PRESSURE  90 capsule  PRN  . levothyroxine (SYNTHROID, LEVOTHROID) 112 MCG tablet Take 112 mcg by mouth daily. Taking  one x 6 days and 1/2 one day      . metoprolol succinate (TOPROL-XL) 25 MG 24 hr tablet       . potassium chloride SA (K-DUR,KLOR-CON) 20 MEQ tablet Take 0.5 tablets (10 mEq total) by mouth daily.  34 tablet  11  . Prenatal Vit-Fe Fumarate-FA (MULTIVITAMIN-PRENATAL) 27-0.8 MG TABS Take 1 tablet by mouth daily.      . rosuvastatin (CRESTOR) 10 MG tablet Take 10 mg by mouth as directed. 1/2 tablet daily      . DISCONTD: metoprolol succinate (TOPROL-XL) 25 MG 24 hr tablet TAKE ONE TABLET BY MOUTH EVERY DAY OR AS DIRECTED  30 tablet  11    Allergies  Allergen Reactions  . Erythromycin   . Tobramycin     Patient Active Problem List  Diagnosis  . Benign hypertensive heart disease without heart failure  . Hyperlipidemia  . Hypothyroidism  . Palpitations  . Atypical chest pain  . History of hyperglycemia    History  Smoking status  . Former Smoker  . Quit date: 01/30/1986  Smokeless tobacco  . Not on file    History  Alcohol Use No    Family History  Problem Relation Age of Onset  . Hypertension Mother   . Heart disease Mother   . Hypertension Father   . Heart disease Father   .  Arthritis Father   . Diabetes Father     Review of Systems: Constitutional: no fever chills diaphoresis or fatigue or change in weight.  Head and neck: no hearing loss, no epistaxis, no photophobia or visual disturbance. Respiratory: No cough, shortness of breath or wheezing. Cardiovascular: No chest pain peripheral edema, palpitations. Gastrointestinal: No abdominal distention, no abdominal pain, no change in bowel habits hematochezia or melena. Genitourinary: No dysuria, no frequency, no urgency, no nocturia. Musculoskeletal:No arthralgias, no back pain, no gait disturbance or myalgias. Neurological: No dizziness, no headaches, no numbness, no seizures, no syncope, no weakness, no tremors. Hematologic: No lymphadenopathy, no easy bruising. Psychiatric: No confusion, no hallucinations, no  sleep disturbance.    Physical Exam: Filed Vitals:   04/01/12 1609  BP: 128/67  Pulse: 73  Resp: 18   the general appearance reveals a well-developed well-nourished woman in no distress.The head and neck exam reveals pupils equal and reactive.  Extraocular movements are full.  There is no scleral icterus.  The mouth and pharynx are normal.  The neck is supple.  The carotids reveal no bruits.  The jugular venous pressure is normal.  The  thyroid is not enlarged.  There is no lymphadenopathy.  The chest is clear to percussion and auscultation.  There are no rales or rhonchi.  Expansion of the chest is symmetrical.  The precordium is quiet.  The first heart sound is normal.  The second heart sound is physiologically split.  There is no murmur gallop rub or click.  There is no abnormal lift or heave.  The abdomen is soft and nontender.  The bowel sounds are normal.  The liver and spleen are not enlarged.  There are no abdominal masses.  There are no abdominal bruits.  Extremities reveal good pedal pulses.  There is no phlebitis or edema.  There is no cyanosis or clubbing.  Strength is normal and symmetrical in all extremities.  There is no lateralizing weakness.  There are no sensory deficits.  The skin is warm and dry.  There is no rash.  EKG from 12/28/11 was reviewed.  It did show deep narrow Q waves in the inferior leads but nuclear stress test did not show any evidence of myocardial infarction or ischemia.   Assessment / Plan: The patient will continue same medication except for reduction in Crestor 25 mg daily.  If she has a recurrence of sudden spike in blood pressure she will take an extra half Toprol as well as her Xanax. At her request we gave her a prescription to get the shingles shot at her pharmacy. Recheck in 4 months for followup office visit lipid panel hepatic function panel nasal metabolic panel and A1c

## 2012-04-01 NOTE — Assessment & Plan Note (Signed)
The patient has mild hyperglycemia and prediabetes.  We'll continue to work harder on diet and exercise.  She does not need any drug therapy for her hyperglycemia yet.

## 2012-04-01 NOTE — Assessment & Plan Note (Signed)
Since her stress test she has not had any recurrent severe chest pain.  She has had improvement in her indigestion symptoms after being placed on Protonix and she has now been switched by her insurance company to omeprazole but that seems to be working fine

## 2012-05-14 ENCOUNTER — Other Ambulatory Visit: Payer: Self-pay | Admitting: Cardiology

## 2012-07-07 ENCOUNTER — Other Ambulatory Visit (INDEPENDENT_AMBULATORY_CARE_PROVIDER_SITE_OTHER): Payer: BC Managed Care – PPO

## 2012-07-07 ENCOUNTER — Other Ambulatory Visit: Payer: BC Managed Care – PPO

## 2012-07-07 DIAGNOSIS — E7800 Pure hypercholesterolemia, unspecified: Secondary | ICD-10-CM

## 2012-07-07 DIAGNOSIS — I119 Hypertensive heart disease without heart failure: Secondary | ICD-10-CM

## 2012-07-07 LAB — BASIC METABOLIC PANEL
BUN: 14 mg/dL (ref 6–23)
CO2: 32 mEq/L (ref 19–32)
Chloride: 100 mEq/L (ref 96–112)
Glucose, Bld: 129 mg/dL — ABNORMAL HIGH (ref 70–99)
Potassium: 3.8 mEq/L (ref 3.5–5.1)

## 2012-07-07 LAB — HEPATIC FUNCTION PANEL
ALT: 30 U/L (ref 0–35)
AST: 25 U/L (ref 0–37)
Bilirubin, Direct: 0.1 mg/dL (ref 0.0–0.3)
Total Bilirubin: 0.7 mg/dL (ref 0.3–1.2)

## 2012-07-07 NOTE — Progress Notes (Signed)
Quick Note:  Please make copy of labs for patient visit. ______ 

## 2012-07-14 ENCOUNTER — Ambulatory Visit (INDEPENDENT_AMBULATORY_CARE_PROVIDER_SITE_OTHER): Payer: BC Managed Care – PPO | Admitting: Cardiology

## 2012-07-14 ENCOUNTER — Encounter: Payer: Self-pay | Admitting: Cardiology

## 2012-07-14 VITALS — BP 126/82 | HR 86 | Ht 67.5 in | Wt 172.0 lb

## 2012-07-14 DIAGNOSIS — R739 Hyperglycemia, unspecified: Secondary | ICD-10-CM

## 2012-07-14 DIAGNOSIS — Z862 Personal history of diseases of the blood and blood-forming organs and certain disorders involving the immune mechanism: Secondary | ICD-10-CM

## 2012-07-14 DIAGNOSIS — E785 Hyperlipidemia, unspecified: Secondary | ICD-10-CM

## 2012-07-14 DIAGNOSIS — I119 Hypertensive heart disease without heart failure: Secondary | ICD-10-CM

## 2012-07-14 DIAGNOSIS — R7309 Other abnormal glucose: Secondary | ICD-10-CM

## 2012-07-14 DIAGNOSIS — R0789 Other chest pain: Secondary | ICD-10-CM

## 2012-07-14 DIAGNOSIS — Z8639 Personal history of other endocrine, nutritional and metabolic disease: Secondary | ICD-10-CM

## 2012-07-14 NOTE — Assessment & Plan Note (Signed)
Her lipids are satisfactory and we will be able to decrease her Crestor to just 5 mg daily

## 2012-07-14 NOTE — Progress Notes (Signed)
Yvonne [name] Date of Birth:  02-10-1946 Yvonne [name] [address] [place], [place]  [address] [phone]         Fax   [phone]  History of Present Illness: This pleasant 66 year old woman is seen for a scheduled four-month followup office visit. He has a past history of essential hypertension and history of hypercholesterolemia and hypothyroidism. She is medically retired from a [employer]. 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 Since last visit the patient has been doing well. She was seen as a work in on 12/28/11 does have chest pain and indigestion. She underwent a nuclear stress test which showed no evidence of ischemia and showed normal left ventricular systolic function. She has a very strong history of vascular disease in her family. Multiple cousins have had strokes and heart attacks. Her own parents were both diabetic and had severe vascular disease. The patient herself has a history of high blood pressure and hyperlipidemia and obesity. She has had mild hyperglycemia in the past and her hemoglobin A1c has been running slightly elevated.   Current Outpatient Prescriptions  Medication Sig Dispense Refill  . ALPRAZolam (XANAX) 0.25 MG tablet TAKE ONE TABLET BY MOUTH EVERY 8 HOURS AS NEEDED  90 tablet  3  . aspirin 81 MG tablet Take 81 mg by mouth daily.        . Cholecalciferol (VITAMIN D) 2000 UNITS tablet Take 1,000 Units by mouth daily.       . fluticasone (FLONASE) 50 MCG/ACT nasal spray INSTILL TWO SPRAYS INTO EACH NOSTRIL ONCE A DAY  16 g  0  . furosemide (LASIX) 20 MG tablet Take 20 mg by mouth as directed. 1/2 tablet daily      . gabapentin (NEURONTIN) 300 MG capsule Take 300 mg by mouth daily.       Marland Kitchen levothyroxine (SYNTHROID, LEVOTHROID) 112 MCG tablet Take 112 mcg by mouth daily. Taking one x 6 days and 1/2 one day      . metoprolol succinate (TOPROL-XL) 25 MG 24 hr tablet  Take 12.5 mg by mouth daily. 0.5 tablet      . omeprazole (PRILOSEC) 10 MG capsule Take 10 mg by mouth as needed.      . potassium chloride SA (K-DUR,KLOR-CON) 20 MEQ tablet TAKE 1/2 TABLET BY MOUTH EVERY DAY  34 tablet  11  . Prenatal Vit-Fe Fumarate-FA (MULTIVITAMIN-PRENATAL) 27-0.8 MG TABS Take 1 tablet by mouth daily.      . rosuvastatin (CRESTOR) 10 MG tablet Take 10 mg by mouth as directed. 1/2 tablet daily      . [DISCONTINUED] furosemide (LASIX) 20 MG tablet Take 1 tablet (20 mg total) by mouth daily as needed.  30 tablet  11    Allergies  Allergen Reactions  . Erythromycin   . Tobramycin     Patient Active Problem List  Diagnosis  . Benign hypertensive heart disease without heart failure  . Hyperlipidemia  . Hypothyroidism  . Palpitations  . Atypical chest pain  . History of hyperglycemia    History  Smoking status  . Former Smoker  . Quit date: 01/30/1986  Smokeless tobacco  . Not on file    History  Alcohol Use No    Family History  Problem Relation Age of Onset  . Hypertension Mother   . Heart disease Mother   . Hypertension Father   . Heart disease Father   . Arthritis  Father   . Diabetes Father     Review of Systems: Constitutional: no fever chills diaphoresis or fatigue or change in weight.  Head and neck: no hearing loss, no epistaxis, no photophobia or visual disturbance. Respiratory: No cough, shortness of breath or wheezing. Cardiovascular: No chest pain peripheral edema, palpitations. Gastrointestinal: No abdominal distention, no abdominal pain, no change in bowel habits hematochezia or melena. Genitourinary: No dysuria, no frequency, no urgency, no nocturia. Musculoskeletal:No arthralgias, no back pain, no gait disturbance or myalgias. Neurological: No dizziness, no headaches, no numbness, no seizures, no syncope, no weakness, no tremors. Hematologic: No lymphadenopathy, no easy bruising. Psychiatric: No confusion, no hallucinations, no  sleep disturbance.    Physical Exam: Filed Vitals:   07/14/12 1636  BP: 126/82  Pulse: 86   the general appearance reveals a well-developed well-nourished woman in no distress.The head and neck exam reveals pupils equal and reactive.  Extraocular movements are full.  There is no scleral icterus.  The mouth and pharynx are normal.  The neck is supple.  The carotids reveal no bruits.  The jugular venous pressure is normal.  The  thyroid is not enlarged.  There is no lymphadenopathy.  The chest is clear to percussion and auscultation.  There are no rales or rhonchi.  Expansion of the chest is symmetrical.  The precordium is quiet.  The first heart sound is normal.  The second heart sound is physiologically split.  There is no murmur gallop rub or click.  There is no abnormal lift or heave.  The abdomen is soft and nontender.  The bowel sounds are normal.  The liver and spleen are not enlarged.  There are no abdominal masses.  There are no abdominal bruits.  Extremities reveal good pedal pulses.  There is no phlebitis or edema.  There is no cyanosis or clubbing.  Strength is normal and symmetrical in all extremities.  There is no lateralizing weakness.  There are no sensory deficits.  The skin is warm and [name].  There is no rash.  EKG shows normal sinus rhythm and minimal voltage for LVH  Assessment / Plan: Continue same medication except stop hydrochlorothiazide and in its place she will take Lasix 10 mg daily.  Her Crestor is being reduced to 5 mg daily She will return in 4 months for followup office visit lipid panel hepatic function panel basal metabolic panel and A1c

## 2012-07-14 NOTE — Assessment & Plan Note (Signed)
No dizzy spells or syncope.  Blood pressure has been remaining stable as long she avoids emotional stress

## 2012-07-14 NOTE — Assessment & Plan Note (Signed)
She is attempting to control her blood sugars with careful diet.  We talked about the importance of choosing foods with a low glycemic index.  She has been on long-term hydrochlorothiazide which in some patients can cause glucose intolerance.  We will stop her hydrochlorothiazide at this point

## 2012-07-14 NOTE — Assessment & Plan Note (Signed)
The patient has not had any recurrent chest pain 

## 2012-07-14 NOTE — Patient Instructions (Addendum)
STOP HCTZ   TAKE YOUR LASIX 20 MG 1/2 TABLET DAILY  DECREASE YOUR CRESTOR TO 5 MG DAILY   Your physician wants you to follow-up in: 4 months with fasting labs (lp/bmet/hfp)  You will receive a reminder letter in the mail two months in advance. If you don't receive a letter, please call our office to schedule the follow-up appointment.

## 2012-07-17 ENCOUNTER — Other Ambulatory Visit: Payer: Self-pay

## 2012-09-27 ENCOUNTER — Other Ambulatory Visit: Payer: Self-pay

## 2012-10-10 ENCOUNTER — Telehealth: Payer: Self-pay

## 2012-10-10 ENCOUNTER — Other Ambulatory Visit: Payer: Self-pay | Admitting: *Deleted

## 2012-10-10 MED ORDER — GABAPENTIN 300 MG PO CAPS: ORAL_CAPSULE | ORAL | Status: DC

## 2012-10-10 MED ORDER — GABAPENTIN 300 MG PO CAPS: 300.0000 mg | ORAL_CAPSULE | Freq: Every day | ORAL | Status: DC

## 2012-10-10 NOTE — Telephone Encounter (Signed)
Spoke with patient will refill gabapentin 300 mg 1 to 2 tablets daily.Prescription sent to [facility].

## 2012-10-15 ENCOUNTER — Other Ambulatory Visit: Payer: Self-pay | Admitting: *Deleted

## 2012-10-15 MED ORDER — GABAPENTIN 300 MG PO CAPS: ORAL_CAPSULE | ORAL | Status: DC

## 2012-11-10 ENCOUNTER — Other Ambulatory Visit: Payer: BC Managed Care – PPO

## 2012-11-10 ENCOUNTER — Other Ambulatory Visit (INDEPENDENT_AMBULATORY_CARE_PROVIDER_SITE_OTHER): Payer: BC Managed Care – PPO

## 2012-11-10 DIAGNOSIS — R739 Hyperglycemia, unspecified: Secondary | ICD-10-CM

## 2012-11-10 DIAGNOSIS — E785 Hyperlipidemia, unspecified: Secondary | ICD-10-CM

## 2012-11-10 DIAGNOSIS — R7309 Other abnormal glucose: Secondary | ICD-10-CM

## 2012-11-10 LAB — HEMOGLOBIN A1C: Hgb A1c MFr Bld: 6.1 % (ref 4.6–6.5)

## 2012-11-10 LAB — BASIC METABOLIC PANEL
BUN: 12 mg/dL (ref 6–23)
CO2: 27 mEq/L (ref 19–32)
Chloride: 102 mEq/L (ref 96–112)
Creatinine, Ser: 0.6 mg/dL (ref 0.4–1.2)
Potassium: 4.2 mEq/L (ref 3.5–5.1)

## 2012-11-10 LAB — LIPID PANEL
LDL Cholesterol: 80 mg/dL (ref 0–99)
Total CHOL/HDL Ratio: 4
VLDL: 20.4 mg/dL (ref 0.0–40.0)

## 2012-11-10 LAB — HEPATIC FUNCTION PANEL
Albumin: 4.5 g/dL (ref 3.5–5.2)
Alkaline Phosphatase: 50 U/L (ref 39–117)
Bilirubin, Direct: 0.2 mg/dL (ref 0.0–0.3)

## 2012-11-10 NOTE — Progress Notes (Signed)
Quick Note:  Please make copy of labs for patient visit. ______ 

## 2012-11-12 ENCOUNTER — Telehealth: Payer: Self-pay | Admitting: Cardiology

## 2012-11-12 NOTE — Telephone Encounter (Signed)
New Problem:    Patient called in wanting to know what the A1c and Glucose number from her recent labs.  Please call back and feel free to leave a message.

## 2012-11-12 NOTE — Telephone Encounter (Signed)
Left on machine as requested, has appointment next week and will get a copy of labs then

## 2012-11-20 ENCOUNTER — Encounter: Payer: Self-pay | Admitting: Cardiology

## 2012-11-20 ENCOUNTER — Ambulatory Visit (INDEPENDENT_AMBULATORY_CARE_PROVIDER_SITE_OTHER): Payer: BC Managed Care – PPO | Admitting: Cardiology

## 2012-11-20 VITALS — BP 120/72 | HR 78 | Ht 67.5 in | Wt 164.0 lb

## 2012-11-20 DIAGNOSIS — R0789 Other chest pain: Secondary | ICD-10-CM

## 2012-11-20 DIAGNOSIS — E785 Hyperlipidemia, unspecified: Secondary | ICD-10-CM

## 2012-11-20 DIAGNOSIS — Z8639 Personal history of other endocrine, nutritional and metabolic disease: Secondary | ICD-10-CM

## 2012-11-20 DIAGNOSIS — R7309 Other abnormal glucose: Secondary | ICD-10-CM

## 2012-11-20 DIAGNOSIS — R002 Palpitations: Secondary | ICD-10-CM

## 2012-11-20 DIAGNOSIS — I119 Hypertensive heart disease without heart failure: Secondary | ICD-10-CM

## 2012-11-20 NOTE — Assessment & Plan Note (Signed)
No further chest pain reported

## 2012-11-20 NOTE — Progress Notes (Signed)
Gae Dry Date of Birth:  1946-02-05 [facility] [address] [place], [place]  [address] [phone]         Fax   [phone]  History of Present Illness: This pleasant 67 year old woman is seen for a scheduled four-month followup office visit.  She has a past history of essential hypertension and history of hypercholesterolemia and hypothyroidism. She is medically retired from a [employer]. 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 Since last visit the patient has been doing well. She was seen as a work in on 12/28/11 does have chest pain and indigestion. She underwent a nuclear stress test which showed no evidence of ischemia and showed normal left ventricular systolic function. She has a very strong history of vascular disease in her family. Multiple cousins have had strokes and heart attacks. Her own parents were both diabetic and had severe vascular disease. The patient herself has a history of high blood pressure and hyperlipidemia and obesity. She has had mild hyperglycemia in the past and her hemoglobin A1c has been running slightly elevated.  After her last office visit we stopped her hydrochlorothiazide and she has been on a more strict diet and her hemoglobin A1c has improved down to 6.1 today.  Current Outpatient Prescriptions  Medication Sig Dispense Refill  . ALPRAZolam (XANAX) 0.25 MG tablet TAKE ONE TABLET BY MOUTH EVERY 8 HOURS AS NEEDED  90 tablet  3  . aspirin 81 MG tablet Take 81 mg by mouth daily.        . Biotin 5000 MCG TABS Take by mouth daily.      . Cholecalciferol 1000 UNITS tablet Take 1,000 Units by mouth daily.      . fish oil-omega-3 fatty acids 1000 MG capsule Take 1 g by mouth daily.      . fluticasone (FLONASE) 50 MCG/ACT nasal spray INSTILL TWO SPRAYS INTO EACH NOSTRIL ONCE A DAY  16 g  0  . furosemide (LASIX) 20 MG tablet Take 10 mg by mouth as directed.       .  gabapentin (NEURONTIN) 300 MG capsule Take 300 mg 1 to 2 daily  60 capsule  5  . levothyroxine (SYNTHROID, LEVOTHROID) 125 MCG tablet Take 125 mcg by mouth daily.      . metoprolol succinate (TOPROL-XL) 25 MG 24 hr tablet Take 12.5 mg by mouth daily. 0.5 tablet      . omeprazole (PRILOSEC) 10 MG capsule Take 10 mg by mouth as needed.      . potassium chloride SA (K-DUR,KLOR-CON) 20 MEQ tablet TAKE 1/2 TABLET BY MOUTH EVERY DAY  34 tablet  11  . Prenatal Vit-Fe Fumarate-FA (MULTIVITAMIN-PRENATAL) 27-0.8 MG TABS Take 1 tablet by mouth daily.      . rosuvastatin (CRESTOR) 20 MG tablet Take 5 mg by mouth daily.       No current facility-administered medications for this visit.    Allergies  Allergen Reactions  . Erythromycin   . Tobramycin     Patient Active Problem List  Diagnosis  . Benign hypertensive heart disease without heart failure  . Hyperlipidemia  . Hypothyroidism  . Palpitations  . Atypical chest pain  . History of hyperglycemia    History  Smoking status  . Former Smoker  . Quit date: 01/30/1986  Smokeless tobacco  . Not on file    History  Alcohol Use No    Family History  Problem Relation Age of Onset  . Hypertension Mother   . Heart disease Mother   . Hypertension Father   . Heart disease Father   . Arthritis Father   . Diabetes Father     Review of Systems: Constitutional: no fever chills diaphoresis or fatigue or change in weight.  Head and neck: no hearing loss, no epistaxis, no photophobia or visual disturbance. Respiratory: No cough, shortness of breath or wheezing. Cardiovascular: No chest pain peripheral edema, palpitations. Gastrointestinal: No abdominal distention, no abdominal pain, no change in bowel habits hematochezia or melena. Genitourinary: No dysuria, no frequency, no urgency, no nocturia. Musculoskeletal:No arthralgias, no back pain, no gait disturbance or myalgias. Neurological: No dizziness, no headaches, no numbness, no  seizures, no syncope, no weakness, no tremors. Hematologic: No lymphadenopathy, no easy bruising. Psychiatric: No confusion, no hallucinations, no sleep disturbance.    Physical Exam: Filed Vitals:   11/20/12 1610  BP: 120/72  Pulse: 78   the general appearance reveals a well-developed well-nourished woman in no distress.The head and neck exam reveals pupils equal and reactive.  Extraocular movements are full.  There is no scleral icterus.  The mouth and pharynx are normal.  The neck is supple.  The carotids reveal no bruits.  The jugular venous pressure is normal.  The  thyroid is not enlarged.  There is no lymphadenopathy.  The chest is clear to percussion and auscultation.  There are no rales or rhonchi.  Expansion of the chest is symmetrical.  The precordium is quiet.  The first heart sound is normal.  The second heart sound is physiologically split.  There is no murmur gallop rub or click.  There is no abnormal lift or heave.  The abdomen is soft and nontender.  The bowel sounds are normal.  The liver and spleen are not enlarged.  There are no abdominal masses.  There are no abdominal bruits.  Extremities reveal good pedal pulses.  There is no phlebitis or edema.  There is no cyanosis or clubbing.  Strength is normal and symmetrical in all extremities.  There is no lateralizing weakness.  There are no sensory deficits.  The skin is warm and dry.  There is no rash.     Assessment / Plan: The patient is to continue same medication.  She does not tolerate eating fish and has a distaste for it so we will have her take a 1000 mg fish oil capsule daily instead of eating fish in her diet.  Keep other medicines the same.  Recheck in 4 months for office visit and fasting lab work.

## 2012-11-20 NOTE — Assessment & Plan Note (Signed)
Her blood pressure has improved since she has been able to lose weight.  She is not having any headaches or dizzy spells.Marland Kitchen

## 2012-11-20 NOTE — Patient Instructions (Addendum)
ADD FISH OIL 1000 MG DAILY  Your physician wants you to follow-up in: 4 months with fasting labs (lp/bmet/hfp/a1c) You will receive a reminder letter in the mail two months in advance. If you don't receive a letter, please call our office to schedule the follow-up appointment.

## 2012-11-20 NOTE — Assessment & Plan Note (Signed)
The patient has not been experiencing any recent palpitations. 

## 2012-11-20 NOTE — Assessment & Plan Note (Signed)
The patient is tolerating Crestor 5 mg daily.  Her lipids are satisfactory although her HDL remains low.  She is trying to increase her aerobic exercise.  Her weight is down 8 pounds since last visit.

## 2013-02-19 ENCOUNTER — Other Ambulatory Visit: Payer: Self-pay | Admitting: Cardiology

## 2013-02-21 ENCOUNTER — Other Ambulatory Visit: Payer: Self-pay | Admitting: Cardiology

## 2013-02-23 ENCOUNTER — Other Ambulatory Visit: Payer: Self-pay | Admitting: *Deleted

## 2013-02-23 MED ORDER — ROSUVASTATIN CALCIUM 20 MG PO TABS: 5.0000 mg | ORAL_TABLET | Freq: Every day | ORAL | Status: DC

## 2013-03-09 ENCOUNTER — Other Ambulatory Visit: Payer: Self-pay | Admitting: Cardiology

## 2013-03-27 ENCOUNTER — Other Ambulatory Visit: Payer: Self-pay | Admitting: *Deleted

## 2013-03-27 DIAGNOSIS — Z79899 Other long term (current) drug therapy: Secondary | ICD-10-CM

## 2013-03-30 ENCOUNTER — Other Ambulatory Visit: Payer: Self-pay | Admitting: Cardiology

## 2013-03-30 DIAGNOSIS — F419 Anxiety disorder, unspecified: Secondary | ICD-10-CM

## 2013-03-30 NOTE — Telephone Encounter (Signed)
Pt needs refill of xanax, rx expired, only wants 30 not 90 at [facility]

## 2013-04-01 MED ORDER — ALPRAZOLAM 0.25 MG PO TABS: ORAL_TABLET | ORAL | Status: DC

## 2013-04-01 NOTE — Telephone Encounter (Signed)
Left message called to pharmacy

## 2013-04-10 ENCOUNTER — Other Ambulatory Visit (INDEPENDENT_AMBULATORY_CARE_PROVIDER_SITE_OTHER): Payer: BC Managed Care – PPO

## 2013-04-10 DIAGNOSIS — E785 Hyperlipidemia, unspecified: Secondary | ICD-10-CM

## 2013-04-10 DIAGNOSIS — Z79899 Other long term (current) drug therapy: Secondary | ICD-10-CM

## 2013-04-10 DIAGNOSIS — Z8639 Personal history of other endocrine, nutritional and metabolic disease: Secondary | ICD-10-CM

## 2013-04-10 DIAGNOSIS — I119 Hypertensive heart disease without heart failure: Secondary | ICD-10-CM

## 2013-04-10 DIAGNOSIS — Z862 Personal history of diseases of the blood and blood-forming organs and certain disorders involving the immune mechanism: Secondary | ICD-10-CM

## 2013-04-10 LAB — BASIC METABOLIC PANEL
CO2: 28 mEq/L (ref 19–32)
Glucose, Bld: 98 mg/dL (ref 70–99)
Potassium: 3.9 mEq/L (ref 3.5–5.1)
Sodium: 140 mEq/L (ref 135–145)

## 2013-04-10 LAB — LIPID PANEL
HDL: 36.8 mg/dL — ABNORMAL LOW (ref >39.00)
Total CHOL/HDL Ratio: 4
VLDL: 19.4 mg/dL (ref 0.0–40.0)

## 2013-04-10 LAB — HEPATIC FUNCTION PANEL
AST: 24 U/L (ref 0–37)
Total Bilirubin: 0.8 mg/dL (ref 0.3–1.2)

## 2013-04-10 LAB — TSH: TSH: 0.1 u[IU]/mL — ABNORMAL LOW (ref 0.35–5.50)

## 2013-04-10 LAB — VITAMIN B12: Vitamin B-12: 765 pg/mL (ref 211–911)

## 2013-04-13 NOTE — Progress Notes (Signed)
Quick Note:  Please make copy of labs for patient visit. ______ 

## 2013-04-21 ENCOUNTER — Encounter: Payer: Self-pay | Admitting: Cardiology

## 2013-04-21 ENCOUNTER — Ambulatory Visit (INDEPENDENT_AMBULATORY_CARE_PROVIDER_SITE_OTHER): Payer: Medicare Other | Admitting: Cardiology

## 2013-04-21 ENCOUNTER — Other Ambulatory Visit: Payer: Medicare Other

## 2013-04-21 VITALS — BP 140/80 | HR 81 | Ht 67.0 in | Wt 165.8 lb

## 2013-04-21 DIAGNOSIS — I119 Hypertensive heart disease without heart failure: Secondary | ICD-10-CM

## 2013-04-21 DIAGNOSIS — R7309 Other abnormal glucose: Secondary | ICD-10-CM

## 2013-04-21 DIAGNOSIS — R739 Hyperglycemia, unspecified: Secondary | ICD-10-CM

## 2013-04-21 DIAGNOSIS — R0789 Other chest pain: Secondary | ICD-10-CM

## 2013-04-21 DIAGNOSIS — E785 Hyperlipidemia, unspecified: Secondary | ICD-10-CM

## 2013-04-21 DIAGNOSIS — E039 Hypothyroidism, unspecified: Secondary | ICD-10-CM

## 2013-04-21 NOTE — Assessment & Plan Note (Signed)
Blood pressure was remaining stable on current medication.  No dizziness or syncope.  No palpitations.

## 2013-04-21 NOTE — Assessment & Plan Note (Signed)
Lipids are satisfactory on current dose of Crestor.  Because of her strong family history of premature coronary disease she should stay on statins

## 2013-04-21 NOTE — Patient Instructions (Addendum)
Your physician recommends that you continue on your current medications as directed. Please refer to the Current Medication list given to you today.  Your physician wants you to follow-up in: 4 months with fasting labs (lp/bmet/hfp/a1c)  You will receive a reminder letter in the mail two months in advance. If you don't receive a letter, please call our office to schedule the follow-up appointment.  

## 2013-04-21 NOTE — Assessment & Plan Note (Signed)
Patient is clinically euthyroid on present dose of Synthroid.  Her TSH is appropriately suppressed

## 2013-04-21 NOTE — Progress Notes (Signed)
Gae Dry Date of Birth:  Oct 22, 1945 [facility] [address] [place], [place]  [address] [phone]         Fax   [phone]  History of Present Illness: This pleasant 67 year old woman is seen for a scheduled four-month followup office visit. She has a past history of essential hypertension and history of hypercholesterolemia and hypothyroidism. She is medically retired from a [employer]. 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 Since last visit the patient has been doing well. She was seen as a work in on 12/28/11 does have chest pain and indigestion. She underwent a nuclear stress test which showed no evidence of ischemia and showed normal left ventricular systolic function. She has a very strong history of vascular disease in her family. Multiple cousins have had strokes and heart attacks. Her own parents were both diabetic and had severe vascular disease. The patient herself has a history of high blood pressure and hyperlipidemia and obesity. She has had mild hyperglycemia in the past and her hemoglobin A1c has been running slightly elevated.  Since last visit she has been making a good effort to get regular exercise and to remain on a heart healthy diet.   Current Outpatient Prescriptions  Medication Sig Dispense Refill  . ALPRAZolam (XANAX) 0.25 MG tablet TAKE ONE TABLET BY MOUTH EVERY 8 HOURS AS NEEDED  30 tablet  5  . aspirin 81 MG tablet Take 81 mg by mouth daily.        . Biotin 5000 MCG TABS Take by mouth daily.      . Cholecalciferol 1000 UNITS tablet Take 1,000 Units by mouth daily.      . fish oil-omega-3 fatty acids 1000 MG capsule Take 1 g by mouth daily.      . fluticasone (FLONASE) 50 MCG/ACT nasal spray INSTILL TWO SPRAYS INTO EACH NOSTRIL ONCE A DAY  16 g  0  . furosemide (LASIX) 20 MG tablet TAKE 1/2 TABLET BY MOUTH DAILY AS DIRECTED  15 tablet  6  . gabapentin (NEURONTIN)  300 MG capsule Take 300 mg 1 to 2 daily  60 capsule  5  . levothyroxine (SYNTHROID, LEVOTHROID) 125 MCG tablet Take 125 mcg by mouth daily.      . metoprolol succinate (TOPROL-XL) 25 MG 24 hr tablet Take 12.5 mg by mouth daily. 0.5 tablet      . omeprazole (PRILOSEC) 10 MG capsule Take 10 mg by mouth as needed.      . potassium chloride SA (K-DUR,KLOR-CON) 20 MEQ tablet TAKE 1/2 TABLET BY MOUTH EVERY DAY  34 tablet  11  . Prenatal Vit-Fe Fumarate-FA (MULTIVITAMIN-PRENATAL) 27-0.8 MG TABS Take 1 tablet by mouth daily.      . rosuvastatin (CRESTOR) 20 MG tablet Take 0.5 tablets (10 mg total) by mouth daily.  30 tablet  6   No current facility-administered medications for this visit.    Allergies  Allergen Reactions  . Erythromycin   . Tobramycin     Patient Active Problem List   Diagnosis Date Noted  . History of hyperglycemia 04/01/2012  . Atypical chest pain 12/28/2011  . Benign hypertensive heart disease without heart failure 02/05/2011  . Hyperlipidemia 02/05/2011  . Hypothyroidism 02/05/2011  . Palpitations 02/05/2011    History  Smoking status  . Former Smoker  . Quit date: 01/30/1986  Smokeless tobacco  . Not on file    History  Alcohol  Use No    Family History  Problem Relation Age of Onset  . Hypertension Mother   . Heart disease Mother   . Hypertension Father   . Heart disease Father   . Arthritis Father   . Diabetes Father     Review of Systems: Constitutional: no fever chills diaphoresis or fatigue or change in weight.  Head and neck: no hearing loss, no epistaxis, no photophobia or visual disturbance. Respiratory: No cough, shortness of breath or wheezing. Cardiovascular: No chest pain peripheral edema, palpitations. Gastrointestinal: No abdominal distention, no abdominal pain, no change in bowel habits hematochezia or melena. Genitourinary: No dysuria, no frequency, no urgency, no nocturia. Musculoskeletal:No arthralgias, no back pain, no gait  disturbance or myalgias. Neurological: No dizziness, no headaches, no numbness, no seizures, no syncope, no weakness, no tremors. Hematologic: No lymphadenopathy, no easy bruising. Psychiatric: No confusion, no hallucinations, no sleep disturbance.    Physical Exam: Filed Vitals:   04/21/13 1628  BP: 140/80  Pulse: 81   the general appearance reveals a well-developed well-nourished woman in no distress.The head and neck exam reveals pupils equal and reactive.  Extraocular movements are full.  There is no scleral icterus.  The mouth and pharynx are normal.  The neck is supple.  The carotids reveal no bruits.  The jugular venous pressure is normal.  The  thyroid is not enlarged.  There is no lymphadenopathy.  The chest is clear to percussion and auscultation.  There are no rales or rhonchi.  Expansion of the chest is symmetrical.  The precordium is quiet.  The first heart sound is normal.  The second heart sound is physiologically split.  There is no murmur gallop rub or click.  There is no abnormal lift or heave.  The abdomen is soft and nontender.  The bowel sounds are normal.  The liver and spleen are not enlarged.  There are no abdominal masses.  There are no abdominal bruits.  Extremities reveal good pedal pulses.  There is no phlebitis or edema.  There is no cyanosis or clubbing.  Strength is normal and symmetrical in all extremities.  There is no lateralizing weakness.  There are no sensory deficits.  The skin is warm and dry.  There is no rash.  EKG shows normal sinus rhythm and is unchanged since 07/14/12   Assessment / Plan:  Continue same medication.  Recheck in 4 months for office visit lipid panel hepatic function panel basal metabolic panel and A1c.  Continue regular exercise

## 2013-04-22 ENCOUNTER — Other Ambulatory Visit: Payer: Self-pay | Admitting: *Deleted

## 2013-04-22 MED ORDER — METOPROLOL SUCCINATE ER 25 MG PO TB24: 12.5000 mg | ORAL_TABLET | Freq: Every day | ORAL | Status: DC

## 2013-05-01 ENCOUNTER — Other Ambulatory Visit: Payer: Self-pay | Admitting: Cardiology

## 2013-05-01 ENCOUNTER — Telehealth: Payer: Self-pay | Admitting: Cardiology

## 2013-05-01 NOTE — Telephone Encounter (Deleted)
error 

## 2013-05-18 ENCOUNTER — Telehealth: Payer: Self-pay | Admitting: *Deleted

## 2013-05-18 NOTE — Telephone Encounter (Signed)
Referral called and faxed to [facility] faxed [phone] left message for referral coordinator to check [phone] fax machine.  I called pt with detailed information concerning referral to [facility].

## 2013-05-20 ENCOUNTER — Encounter: Payer: Self-pay | Admitting: Neurology

## 2013-05-21 ENCOUNTER — Ambulatory Visit (INDEPENDENT_AMBULATORY_CARE_PROVIDER_SITE_OTHER): Payer: Medicare Other | Admitting: Neurology

## 2013-05-21 ENCOUNTER — Encounter: Payer: Self-pay | Admitting: Neurology

## 2013-05-21 VITALS — BP 135/75 | HR 87 | Ht 67.0 in | Wt 166.0 lb

## 2013-05-21 DIAGNOSIS — D518 Other vitamin B12 deficiency anemias: Secondary | ICD-10-CM

## 2013-05-21 DIAGNOSIS — G63 Polyneuropathy in diseases classified elsewhere: Secondary | ICD-10-CM

## 2013-05-21 HISTORY — DX: Polyneuropathy in diseases classified elsewhere: G63

## 2013-05-21 NOTE — Progress Notes (Signed)
Reason for visit: Peripheral neuropathy  Yvonne [name] is a 67 y.o. female  History of present illness:  Yvonne [name] is a 67 year old right-handed white female with a history of problems with numbness, tingling sensations and discomfort in the feet that dates back at least 10 years. The patient believes that there has been some mild, gradual progression in her symptoms. The patient mainly feels the numbness in the distal one half of the feet, and mainly on the ball the feet and tips of the toes. The patient denies any symptoms on her hands. The patient indicates that she has a cold sensation in the feet that is present at all times, more noticeable when she is inactive, and when trying to get to sleep at night. The patient will have to warm her feet at night, and she is able to sleep. The patient takes low-dose gabapentin, 300 mg at night without complete benefit. The patient denies any balance issues, and she denies any significant problems with neck or low back pain, but she has had some intermittent problems with back and neck discomfort. The patient has recently undergone a nerve conduction study that confirmed the presence of a peripheral neuropathy. The patient underwent a nerve conduction study 10 years ago and was told she had a mild peripheral neuropathy at that time. The patient indicates that she has been on some statin type medication for many years, and she currently is on Crestor. In the past, the patient has had surgery for carpal tunnel syndrome bilaterally. The patient comes to this office for an evaluation.  Past Medical History  Diagnosis Date  . Hypertension   . Hyperlipidemia   . Hypothyroidism   . Heart palpitations   . LVH (left ventricular hypertrophy)     per echo in 2011  . History of diastolic dysfunction     per echo in 2011  . Anxiety   . Chest pressure   . Degenerative arthritis   . Sleep difficulties   . Normal nuclear stress test 2011  . Polyneuropathy  in other diseases classified elsewhere 05/21/2013    Past Surgical History  Procedure Laterality Date  . Cardiovascular stress test  11/09/2009    EF 75%  . Transthoracic echocardiogram  11/01/2009    NORMAL LV FILLING AND DIASTOLIC DYSFUNCTION AND MILD AORTIC SCLEROSIS AND TRACE MITRAL REGURGITATION  . Appendectomy    . Carpal tunnel release      bilateral    Family History  Problem Relation Age of Onset  . Hypertension Mother   . Heart disease Mother   . Hypertension Father   . Heart disease Father   . Arthritis Father   . Diabetes Father     Social history:  reports that she quit smoking about 27 years ago. She has never used smokeless tobacco. She reports that she drinks alcohol. She reports that she does not use illicit drugs.  Medications:  Current Outpatient Prescriptions on File Prior to Visit  Medication Sig Dispense Refill  . ALPRAZolam (XANAX) 0.25 MG tablet TAKE ONE TABLET BY MOUTH EVERY 8 HOURS AS NEEDED  30 tablet  5  . aspirin 81 MG tablet Take 81 mg by mouth daily.        . Biotin 5000 MCG TABS Take by mouth daily.      . Cholecalciferol 1000 UNITS tablet Take 1,000 Units by mouth daily.      . fish oil-omega-3 fatty acids 1000 MG capsule Take 1 g by mouth daily.      Marland Kitchen  fluticasone (FLONASE) 50 MCG/ACT nasal spray INSTILL TWO SPRAYS INTO EACH NOSTRIL ONCE A DAY  16 g  0  . furosemide (LASIX) 20 MG tablet TAKE 1/2 TABLET BY MOUTH DAILY AS DIRECTED  15 tablet  6  . gabapentin (NEURONTIN) 300 MG capsule TAKE 1 CAPSULE BY MOUTH ONCE A DAY  68 capsule  11  . levothyroxine (SYNTHROID, LEVOTHROID) 125 MCG tablet Take 125 mcg by mouth daily.      . metoprolol succinate (TOPROL-XL) 25 MG 24 hr tablet Take 0.5 tablets (12.5 mg total) by mouth daily.  30 tablet  3  . omeprazole (PRILOSEC) 10 MG capsule Take 10 mg by mouth as needed.      . potassium chloride SA (K-DUR,KLOR-CON) 20 MEQ tablet TAKE 1/2 TABLET BY MOUTH EVERY DAY  34 tablet  11  . Prenatal Vit-Fe Fumarate-FA  (MULTIVITAMIN-PRENATAL) 27-0.8 MG TABS Take 1 tablet by mouth daily.      . rosuvastatin (CRESTOR) 20 MG tablet Take 0.5 tablets (10 mg total) by mouth daily.  30 tablet  6   No current facility-administered medications on file prior to visit.      Allergies  Allergen Reactions  . Erythromycin   . Tobramycin     ROS:  Out of a complete 14 system review of symptoms, the patient complains only of the following symptoms, and all other reviewed systems are negative.  Feeling hot, cold Joint pain Numbness in the feet Insomnia  Blood pressure 135/75, pulse 87, height 5\' 7"  (1.702 m), weight 166 lb (75.297 kg).  Physical Exam  General: The patient is alert and cooperative at the time of the examination.  Head: Pupils are equal, round, and reactive to light. Discs are flat bilaterally.  Neck: The neck is supple, no carotid bruits are noted.  Respiratory: The respiratory examination is clear.  Cardiovascular: The cardiovascular examination reveals a regular rate and rhythm, no obvious murmurs or rubs are noted.  Neuromuscular: The patient has good range of movement of the lumbosacral spine.  Skin: Extremities are without significant edema.  Neurologic Exam  Mental status:  Cranial nerves: Facial symmetry is present. There is good sensation of the face to pinprick and soft touch bilaterally. The strength of the facial muscles and the muscles to head turning and shoulder shrug are normal bilaterally. Speech is well enunciated, no aphasia or dysarthria is noted. Extraocular movements are full. Visual fields are full.  Motor: The motor testing reveals 5 over 5 strength of all 4 extremities. Good symmetric motor tone is noted throughout.  Sensory: Sensory testing is intact to pinprick, soft touch, vibration sensation, and position sense on all 4 extremities, with the exception that there is a stocking pattern pinprick sensory deficit in the distal one half of the feet bilaterally.  No evidence of extinction is noted.  Coordination: Cerebellar testing reveals good finger-nose-finger and heel-to-shin bilaterally.  Gait and station: Gait is normal. Tandem gait is normal. Romberg is negative. No drift is seen.  Reflexes: Deep tendon reflexes are symmetric and normal bilaterally, with exception that the ankle jerk reflexes are trace bilaterally. Toes are downgoing bilaterally.   Assessment/Plan:  1. Peripheral neuropathy  The patient will undergo further blood work for her peripheral neuropathy. It is possible that long-term statin medications used may be the etiology. The patient however, has a mild neuropathy, and the neuropathy has progressed very slowly over a number of years. The risk of discontinuance of the statin drugs may be higher than the benefits gained  by improvement of the neuropathy symptoms. It may take 2 years off of the statin drugs to know whether or not the statin medications are the actual cause of the neuropathy. The patient will go up on her gabapentin taking 600 mg at night. The patient followup through this office in 6 months.  Marlan Palau MD 05/21/2013 9:02 AM  [facility] [address] [place], [place] [address]  Phone [phone] Fax [phone]

## 2013-05-22 ENCOUNTER — Telehealth: Payer: Self-pay | Admitting: Neurology

## 2013-05-22 NOTE — Telephone Encounter (Signed)
I called the patient. The patient wanted to make sure that her cardiologist.the results of the blood work and the office visit. Dr. Patty Sermons is on the Epic system, and he will have full access to all of our records.

## 2013-05-25 ENCOUNTER — Telehealth: Payer: Self-pay

## 2013-05-25 LAB — IFE AND PE, SERUM
Albumin/Glob SerPl: 1.9 (ref 0.7–2.0)
Globulin, Total: 2.4 g/dL (ref 2.0–4.5)
IgG (Immunoglobin G), Serum: 954 mg/dL (ref 700–1600)
IgM (Immunoglobulin M), Srm: 155 mg/dL (ref 40–230)
Total Protein: 6.8 g/dL (ref 6.0–8.5)

## 2013-05-25 LAB — ANA W/REFLEX: Anti Nuclear Antibody(ANA): NEGATIVE

## 2013-05-25 NOTE — Telephone Encounter (Signed)
I called patient. The patient has a history of a C4-5 small central disc. If the patient is having neck or low back pain, reevaluating the spine may be reasonable. The prior nerve conduction studies did confirm a peripheral neuropathy, however. The patient will call me if she is having spine issues.

## 2013-05-25 NOTE — Telephone Encounter (Signed)
Message copied by [facility] on Mon May 25, 2013  2:27 PM ------      Message from: Stephanie Acre      Created: Mon May 25, 2013 12:34 PM       Please call the patient. The blood work results are unremarkable. Thank you.            ----- Message -----         From: [facility] Lab Results In Interface         Sent: 05/25/2013  11:40 AM           To: York Spaniel, MD                   ------

## 2013-05-25 NOTE — Telephone Encounter (Signed)
I called and spoke with patient. We discussed Dr. Anne Hahn' findings of blood work and I reviewed the blood work that was done, per patient request.   Patient stated that she and Dr. began discussing patient's history of having some ruptured discs at one time. Patient and Dr. reviewed what could be the cause of this (neuropathy). Patient is asking, could it be a degenerative or ruptured disc again? Do you (Dr. Anne Hahn) want more studies or should she just follow with cardiologist now and see you in 6 months.   Please advise.

## 2013-05-26 NOTE — Progress Notes (Signed)
Quick Note: ° °Call pt about lab results, pt verbalized understanding. °______ °

## 2013-06-18 ENCOUNTER — Other Ambulatory Visit: Payer: Self-pay

## 2013-06-19 ENCOUNTER — Other Ambulatory Visit: Payer: Self-pay | Admitting: Cardiology

## 2013-06-30 ENCOUNTER — Encounter: Payer: Self-pay | Admitting: Cardiology

## 2013-08-24 ENCOUNTER — Other Ambulatory Visit: Payer: Medicare Other

## 2013-08-26 ENCOUNTER — Ambulatory Visit: Payer: Medicare Other | Admitting: Cardiology

## 2013-09-28 ENCOUNTER — Other Ambulatory Visit: Payer: Self-pay | Admitting: *Deleted

## 2013-09-28 ENCOUNTER — Other Ambulatory Visit: Payer: Medicare Other

## 2013-09-28 ENCOUNTER — Ambulatory Visit: Payer: Medicare Other

## 2013-09-28 ENCOUNTER — Other Ambulatory Visit (INDEPENDENT_AMBULATORY_CARE_PROVIDER_SITE_OTHER): Payer: Medicare Other

## 2013-09-28 DIAGNOSIS — E039 Hypothyroidism, unspecified: Secondary | ICD-10-CM

## 2013-09-28 DIAGNOSIS — R739 Hyperglycemia, unspecified: Secondary | ICD-10-CM

## 2013-09-28 DIAGNOSIS — R7309 Other abnormal glucose: Secondary | ICD-10-CM

## 2013-09-28 DIAGNOSIS — I119 Hypertensive heart disease without heart failure: Secondary | ICD-10-CM

## 2013-09-28 LAB — BASIC METABOLIC PANEL
BUN: 12 mg/dL (ref 6–23)
CO2: 29 mEq/L (ref 19–32)
CREATININE: 0.7 mg/dL (ref 0.4–1.2)
Calcium: 9.2 mg/dL (ref 8.4–10.5)
Chloride: 105 mEq/L (ref 96–112)
GFR: 93.75 mL/min (ref >60.00)
GLUCOSE: 101 mg/dL — AB (ref 70–99)
Potassium: 4 mEq/L (ref 3.5–5.1)
Sodium: 139 mEq/L (ref 135–145)

## 2013-09-28 LAB — HEPATIC FUNCTION PANEL
ALK PHOS: 47 U/L (ref 39–117)
ALT: 24 U/L (ref 0–35)
AST: 23 U/L (ref 0–37)
Albumin: 4.2 g/dL (ref 3.5–5.2)
BILIRUBIN DIRECT: 0.1 mg/dL (ref 0.0–0.3)
BILIRUBIN TOTAL: 0.8 mg/dL (ref 0.3–1.2)
Total Protein: 7 g/dL (ref 6.0–8.3)

## 2013-09-28 LAB — LIPID PANEL
CHOLESTEROL: 135 mg/dL (ref 0–200)
HDL: 36.3 mg/dL — ABNORMAL LOW (ref >39.00)
LDL Cholesterol: 76 mg/dL (ref 0–99)
Total CHOL/HDL Ratio: 4
Triglycerides: 112 mg/dL (ref 0.0–149.0)
VLDL: 22.4 mg/dL (ref 0.0–40.0)

## 2013-09-28 LAB — TSH: TSH: 0.11 u[IU]/mL — ABNORMAL LOW (ref 0.35–5.50)

## 2013-09-28 LAB — HEMOGLOBIN A1C: HEMOGLOBIN A1C: 6.6 % — AB (ref 4.6–6.5)

## 2013-09-29 NOTE — Progress Notes (Signed)
Quick Note:  Please make copy of labs for patient visit. ______ 

## 2013-10-01 ENCOUNTER — Ambulatory Visit: Payer: Medicare Other | Admitting: Cardiology

## 2013-10-02 ENCOUNTER — Ambulatory Visit (INDEPENDENT_AMBULATORY_CARE_PROVIDER_SITE_OTHER): Payer: Medicare Other | Admitting: Cardiology

## 2013-10-02 ENCOUNTER — Encounter: Payer: Self-pay | Admitting: Cardiology

## 2013-10-02 VITALS — BP 126/74 | HR 85 | Ht 67.0 in | Wt 165.0 lb

## 2013-10-02 DIAGNOSIS — I119 Hypertensive heart disease without heart failure: Secondary | ICD-10-CM

## 2013-10-02 DIAGNOSIS — R739 Hyperglycemia, unspecified: Secondary | ICD-10-CM

## 2013-10-02 DIAGNOSIS — R7309 Other abnormal glucose: Secondary | ICD-10-CM

## 2013-10-02 DIAGNOSIS — E785 Hyperlipidemia, unspecified: Secondary | ICD-10-CM

## 2013-10-02 DIAGNOSIS — R002 Palpitations: Secondary | ICD-10-CM

## 2013-10-02 DIAGNOSIS — R0789 Other chest pain: Secondary | ICD-10-CM

## 2013-10-02 NOTE — Assessment & Plan Note (Signed)
Blood pressure is labile.  At home she will feel her blood pressure rising if she gets emotionally upset.  If this happens she will take an extra alprazolam and she can also take an extra half tablet of beta blocker metoprolol

## 2013-10-02 NOTE — Progress Notes (Signed)
Yvonne [name] Date of Birth:  09/26/1945 [facility] [address] [place], [place]  [address] [phone]         Fax   [phone]  History of Present Illness: This pleasant 68 year old woman is seen for a scheduled four-month followup office visit. She has a past history of essential hypertension and history of hypercholesterolemia and hypothyroidism. 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 Since last visit the patient has been doing well. She was seen as a work in on 12/28/11 does have chest pain and indigestion. She underwent a nuclear stress test which showed no evidence of ischemia and showed normal left ventricular systolic function. She has a very strong history of vascular disease in her family. Multiple cousins have had strokes and heart attacks. Her own parents were both diabetic and had severe vascular disease. The patient herself has a history of high blood pressure and hyperlipidemia and obesity. She has had mild hyperglycemia in the past and her hemoglobin A1c has been running slightly elevated.  Since last visit she has been making a good effort to get regular exercise and to remain on a heart healthy diet.  The patient has a history of mild to moderate idiopathic peripheral neuropathy and is followed by Dr. Jannifer Franklin. There is a strong family history of pancreatic cancer in the family.  The patient's brother died of pancreatic cancer at age 66 and now a maternal cousin has also developed pancreatic cancer.  She will discuss this with her gastroenterologist Dr. Teena Irani.   Current Outpatient Prescriptions  Medication Sig Dispense Refill  . rosuvastatin (CRESTOR) 5 MG tablet Take 5 mg by mouth daily.      Marland Kitchen ALPRAZolam (XANAX) 0.25 MG tablet TAKE ONE TABLET BY MOUTH EVERY 8 HOURS AS NEEDED  30 tablet  5  . aspirin 81 MG tablet Take 81 mg by mouth  daily.        . Biotin 5000 MCG TABS Take by mouth daily.      . Cholecalciferol 1000 UNITS tablet Take 1,000 Units by mouth daily.      . fish oil-omega-3 fatty acids 1000 MG capsule Take 1 g by mouth daily.      . fluticasone (FLONASE) 50 MCG/ACT nasal spray INSTILL TWO SPRAYS INTO EACH NOSTRIL ONCE A DAY  16 g  0  . FLUZONE HIGH-DOSE injection Inject 0.05 mLs into the muscle.      . furosemide (LASIX) 20 MG tablet TAKE 1/2 TABLET BY MOUTH DAILY AS DIRECTED  15 tablet  6  . gabapentin (NEURONTIN) 300 MG capsule TAKE 1 CAPSULE BY MOUTH ONCE A DAY  68 capsule  11  . levothyroxine (SYNTHROID, LEVOTHROID) 125 MCG tablet Take 125 mcg by mouth daily.      . metoprolol succinate (TOPROL-XL) 25 MG 24 hr tablet Take 0.5 tablets (12.5 mg total) by mouth daily.  30 tablet  3  . metroNIDAZOLE (METROCREAM) 0.75 % cream       . omeprazole (PRILOSEC) 10 MG capsule Take 10 mg by mouth as needed.      . potassium chloride SA (K-DUR,KLOR-CON) 20 MEQ tablet TAKE 1/2 TABLET BY MOUTH EVERY DAY  34 tablet  1  . Prenatal Vit-Fe Fumarate-FA (MULTIVITAMIN-PRENATAL) 27-0.8 MG TABS Take 1 tablet by mouth daily.       No current facility-administered medications for this visit.  Allergies  Allergen Reactions  . Erythromycin   . Tobramycin     Patient Active Problem List   Diagnosis Date Noted  . Polyneuropathy in other diseases classified elsewhere 05/21/2013  . History of hyperglycemia 04/01/2012  . Atypical chest pain 12/28/2011  . Benign hypertensive heart disease without heart failure 02/05/2011  . Hyperlipidemia 02/05/2011  . Hypothyroidism 02/05/2011  . Palpitations 02/05/2011    History  Smoking status  . Former Smoker  . Quit date: 01/30/1986  Smokeless tobacco  . Never Used    History  Alcohol Use  . Yes    Comment: one glass of wine a week    Family History  Problem Relation Age of Onset  . Hypertension Mother   . Heart disease Mother   . Hypertension Father   . Heart disease  Father   . Arthritis Father   . Diabetes Father     Review of Systems: Constitutional: no fever chills diaphoresis or fatigue or change in weight.  Head and neck: no hearing loss, no epistaxis, no photophobia or visual disturbance. Respiratory: No cough, shortness of breath or wheezing. Cardiovascular: No chest pain peripheral edema, palpitations. Gastrointestinal: No abdominal distention, no abdominal pain, no change in bowel habits hematochezia or melena. Genitourinary: No dysuria, no frequency, no urgency, no nocturia. Musculoskeletal:No arthralgias, no back pain, no gait disturbance or myalgias. Neurological: No dizziness, no headaches, no numbness, no seizures, no syncope, no weakness, no tremors. Hematologic: No lymphadenopathy, no easy bruising. Psychiatric: No confusion, no hallucinations, no sleep disturbance.    Physical Exam: Filed Vitals:   10/02/13 0856  BP: 126/74  Pulse: 85   the general appearance reveals a well-developed well-nourished woman in no distress.The head and neck exam reveals pupils equal and reactive.  Extraocular movements are full.  There is no scleral icterus.  The mouth and pharynx are normal.  The neck is supple.  The carotids reveal no bruits.  The jugular venous pressure is normal.  The  thyroid is not enlarged.  There is no lymphadenopathy.  The chest is clear to percussion and auscultation.  There are no rales or rhonchi.  Expansion of the chest is symmetrical.  The precordium is quiet.  The first heart sound is normal.  The second heart sound is physiologically split.  There is no murmur gallop rub or click.  There is no abnormal lift or heave.  The abdomen is soft and nontender.  The bowel sounds are normal.  The liver and spleen are not enlarged.  There are no abdominal masses.  There are no abdominal bruits.  Extremities reveal good pedal pulses.  There is no phlebitis or edema.  There is no cyanosis or clubbing.  Strength is normal and symmetrical in  all extremities.  There is no lateralizing weakness.  There are no sensory deficits.  The skin is warm and dry.  There is no rash.     Assessment / Plan:  Continue same medication.  Recheck in 4 months for office visit lipid panel hepatic function panel basal metabolic panel and O8C.  Continue regular exercise.  Her weight is unchanged today.  Continue to work on weight.

## 2013-10-02 NOTE — Assessment & Plan Note (Signed)
Patient has a history of dyslipidemia.  We reviewed her recent labs which are satisfactory.

## 2013-10-02 NOTE — Patient Instructions (Signed)
Your physician recommends that you continue on your current medications as directed. Please refer to the Current Medication list given to you today.  Your physician recommends that you schedule a follow-up appointment in: 4 months with fasting labs (lp/bmet/hfp/a1c)

## 2013-10-02 NOTE — Assessment & Plan Note (Signed)
The patient has not been having any recurrent chest pain or angina.  She walks for exercise.

## 2013-11-12 ENCOUNTER — Other Ambulatory Visit: Payer: Self-pay | Admitting: Cardiology

## 2013-11-12 NOTE — Telephone Encounter (Signed)
error 

## 2013-11-23 ENCOUNTER — Ambulatory Visit: Payer: Medicare Other | Admitting: Neurology

## 2013-12-05 ENCOUNTER — Other Ambulatory Visit: Payer: Self-pay | Admitting: Cardiology

## 2013-12-08 ENCOUNTER — Telehealth: Payer: Self-pay | Admitting: Cardiology

## 2013-12-08 NOTE — Telephone Encounter (Signed)
Advised patient, verbalized understanding  

## 2013-12-08 NOTE — Telephone Encounter (Signed)
Patient took 2 ibuprofen yesterday and 3 last night for aching in her left side under breast around to front. Has been going on for over a week. She watches her 65 year old and almost 75 month old. She does carry the 41 month old around on this side. Last night she started to feel "jumpy" inside. Patient denied headache or chest pain. She is having increased indigestion, wt up, some swelling in feet. Per patient she is not been eating healthy and had more salty food yesterday with dinner. When she took her blood pressure last night machine went up to 220 about 3 times but she never let it finish. She took a Xanax and called on call MD (per patient Dr Aundra Dubin). She was advised to take a full Metoprolol and ok to take another Xanax and go to bed. Patient only took 1/2 Metoprolol and was not able to sleep. Lowest blood pressure during the night was 150/86. Today her blood pressure was 130/86-140/79. Patient did inquire with Dr Aundra Dubin at what point she should go to ED. Per Patient he recommended taking medication and if no headache, chest pains, or slurred speech to just stay home. She would like to make sure  Dr. Mare Ferrari agrees with these recommendations. Will forward to  Dr. Mare Ferrari for review

## 2013-12-08 NOTE — Telephone Encounter (Signed)
New message     Talk to a nurse regarding blood pressure issures

## 2013-12-08 NOTE — Telephone Encounter (Signed)
Agree with those recommendations.  She needs to be more careful with diet and to work very hard to get her weight down and to get back into a regular aerobic walking program.  Continue current medication.  Okay to take extra metoprolol in the future if blood pressure spikes.

## 2013-12-17 ENCOUNTER — Other Ambulatory Visit: Payer: Self-pay | Admitting: *Deleted

## 2013-12-17 MED ORDER — METOPROLOL SUCCINATE ER 25 MG PO TB24: 12.5000 mg | ORAL_TABLET | Freq: Every day | ORAL | Status: DC

## 2013-12-17 MED ORDER — FUROSEMIDE 20 MG PO TABS: ORAL_TABLET | ORAL | Status: DC

## 2014-01-05 ENCOUNTER — Other Ambulatory Visit: Payer: Self-pay | Admitting: *Deleted

## 2014-01-05 ENCOUNTER — Telehealth: Payer: Self-pay | Admitting: *Deleted

## 2014-01-05 DIAGNOSIS — E039 Hypothyroidism, unspecified: Secondary | ICD-10-CM

## 2014-01-05 NOTE — Telephone Encounter (Signed)
Received a call from Dr Baldwin Crown office [facility]). Dr Forde Dandy requested Free T4, Total T3, and TSH be done at June 5 labs. Orders placed in Epic

## 2014-01-15 ENCOUNTER — Other Ambulatory Visit (INDEPENDENT_AMBULATORY_CARE_PROVIDER_SITE_OTHER): Payer: Medicare Other

## 2014-01-15 ENCOUNTER — Other Ambulatory Visit: Payer: Medicare Other

## 2014-01-15 DIAGNOSIS — I119 Hypertensive heart disease without heart failure: Secondary | ICD-10-CM

## 2014-01-15 DIAGNOSIS — R7309 Other abnormal glucose: Secondary | ICD-10-CM

## 2014-01-15 DIAGNOSIS — E785 Hyperlipidemia, unspecified: Secondary | ICD-10-CM

## 2014-01-15 DIAGNOSIS — E039 Hypothyroidism, unspecified: Secondary | ICD-10-CM

## 2014-01-15 DIAGNOSIS — R739 Hyperglycemia, unspecified: Secondary | ICD-10-CM

## 2014-01-15 LAB — BASIC METABOLIC PANEL
BUN: 11 mg/dL (ref 6–23)
CHLORIDE: 104 meq/L (ref 96–112)
CO2: 30 meq/L (ref 19–32)
CREATININE: 0.6 mg/dL (ref 0.4–1.2)
Calcium: 9.4 mg/dL (ref 8.4–10.5)
GFR: 98.75 mL/min (ref >60.00)
Glucose, Bld: 110 mg/dL — ABNORMAL HIGH (ref 70–99)
Potassium: 4 mEq/L (ref 3.5–5.1)
Sodium: 140 mEq/L (ref 135–145)

## 2014-01-15 LAB — LIPID PANEL
CHOL/HDL RATIO: 4
Cholesterol: 139 mg/dL (ref 0–200)
HDL: 32.6 mg/dL — ABNORMAL LOW (ref >39.00)
LDL Cholesterol: 88 mg/dL (ref 0–99)
NONHDL: 106.4
Triglycerides: 94 mg/dL (ref 0.0–149.0)
VLDL: 18.8 mg/dL (ref 0.0–40.0)

## 2014-01-15 LAB — HEPATIC FUNCTION PANEL
ALT: 26 U/L (ref 0–35)
AST: 20 U/L (ref 0–37)
Albumin: 4.3 g/dL (ref 3.5–5.2)
Alkaline Phosphatase: 44 U/L (ref 39–117)
BILIRUBIN DIRECT: 0.1 mg/dL (ref 0.0–0.3)
Total Bilirubin: 0.7 mg/dL (ref 0.2–1.2)
Total Protein: 7 g/dL (ref 6.0–8.3)

## 2014-01-15 LAB — TSH: TSH: 0.4 u[IU]/mL (ref 0.35–4.50)

## 2014-01-15 LAB — HEMOGLOBIN A1C: Hgb A1c MFr Bld: 6.6 % — ABNORMAL HIGH (ref 4.6–6.5)

## 2014-01-15 LAB — T3: T3, Total: 95 ng/dL (ref 80.0–204.0)

## 2014-01-15 LAB — T4, FREE: FREE T4: 1.37 ng/dL (ref 0.60–1.60)

## 2014-01-15 NOTE — Progress Notes (Signed)
Quick Note:  Please make copy of labs for patient visit. ______ 

## 2014-01-17 NOTE — Progress Notes (Signed)
Quick Note:  Please make copy of labs for patient visit. ______ 

## 2014-01-20 ENCOUNTER — Ambulatory Visit (INDEPENDENT_AMBULATORY_CARE_PROVIDER_SITE_OTHER): Payer: Medicare Other | Admitting: Cardiology

## 2014-01-20 ENCOUNTER — Encounter: Payer: Self-pay | Admitting: Cardiology

## 2014-01-20 VITALS — BP 123/75 | HR 80 | Ht 67.0 in | Wt 163.5 lb

## 2014-01-20 DIAGNOSIS — E039 Hypothyroidism, unspecified: Secondary | ICD-10-CM

## 2014-01-20 DIAGNOSIS — I119 Hypertensive heart disease without heart failure: Secondary | ICD-10-CM

## 2014-01-20 DIAGNOSIS — R002 Palpitations: Secondary | ICD-10-CM

## 2014-01-20 DIAGNOSIS — R739 Hyperglycemia, unspecified: Secondary | ICD-10-CM

## 2014-01-20 DIAGNOSIS — G63 Polyneuropathy in diseases classified elsewhere: Secondary | ICD-10-CM

## 2014-01-20 DIAGNOSIS — E785 Hyperlipidemia, unspecified: Secondary | ICD-10-CM

## 2014-01-20 DIAGNOSIS — R7309 Other abnormal glucose: Secondary | ICD-10-CM

## 2014-01-20 NOTE — Assessment & Plan Note (Signed)
The patient has been walking daily for exercise.  She has not been experiencing any exertional chest pain or any recent palpitations.

## 2014-01-20 NOTE — Progress Notes (Signed)
Yvonne [name] Date of Birth:  18-May-1946 [facility] [address] [place], [place]  [address] [phone]        Fax   [phone]   History of Present Illness:  This pleasant 68 year old woman is seen for a scheduled four-month followup office visit. She has a past history of essential hypertension and history of hypercholesterolemia and hypothyroidism. 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 Since last visit the patient has been doing well. She was seen as a work in on 12/28/11 does have chest pain and indigestion. She underwent a nuclear stress test which showed no evidence of ischemia and showed normal left ventricular systolic function.  Her ejection fraction was 77%. She has a very strong history of vascular disease in her family. Multiple cousins have had strokes and heart attacks. Her own parents were both diabetic and had severe vascular disease. The patient herself has a history of high blood pressure and hyperlipidemia and obesity. She has had mild hyperglycemia in the past and her hemoglobin A1c has been running slightly elevated. Since last visit she has been making a good effort to get regular exercise and to remain on a heart healthy diet. The patient has a history of mild to moderate idiopathic peripheral neuropathy and is followed by Dr. Jannifer Franklin.  Since last visit she has been having some problems with tinnitus and has seen ENT specialist Dr. Wilburn Cornelia.   Current Outpatient Prescriptions  Medication Sig Dispense Refill  . ALPRAZolam (XANAX) 0.25 MG tablet TAKE ONE TABLET BY MOUTH EVERY 8 HOURS AS NEEDED  30 tablet  5  . aspirin 81 MG tablet Take 81 mg by mouth daily.        . Cholecalciferol 1000 UNITS tablet Take 1,000 Units by mouth daily.      . fish oil-omega-3 fatty acids 1000 MG capsule Take 1 g by mouth daily.      . fluticasone  (FLONASE) 50 MCG/ACT nasal spray INSTILL TWO SPRAYS INTO EACH NOSTRIL ONCE A DAY  16 g  0  . FLUZONE HIGH-DOSE injection Inject 0.05 mLs into the muscle.      . furosemide (LASIX) 20 MG tablet TAKE 1/2 TABLET BY MOUTH DAILY AS DIRECTED  45 tablet  0  . gabapentin (NEURONTIN) 300 MG capsule TAKE 1 CAPSULE BY MOUTH ONCE A DAY  68 capsule  11  . levothyroxine (SYNTHROID, LEVOTHROID) 125 MCG tablet Take 125 mcg by mouth daily. DAILY EXCEPT SUNDAYS      . metoprolol succinate (TOPROL-XL) 25 MG 24 hr tablet Take 0.5 tablets (12.5 mg total) by mouth daily.  90 tablet  0  . metroNIDAZOLE (METROCREAM) 0.75 % cream       . omeprazole (PRILOSEC) 10 MG capsule Take 10 mg by mouth as needed.      . potassium chloride SA (K-DUR,KLOR-CON) 20 MEQ tablet TAKE 1/2 TABLET BY MOUTH EVERY DAY  34 tablet  1  . Prenatal Vit-Fe Fumarate-FA (MULTIVITAMIN-PRENATAL) 27-0.8 MG TABS Take 1 tablet by mouth daily.      . rosuvastatin (CRESTOR) 5 MG tablet Take 5 mg by mouth daily.       No current facility-administered medications for this visit.    Allergies  Allergen Reactions  . Erythromycin   . Tobramycin     Patient Active Problem List   Diagnosis Date Noted  . Polyneuropathy  in other diseases classified elsewhere 05/21/2013  . History of hyperglycemia 04/01/2012  . Atypical chest pain 12/28/2011  . Benign hypertensive heart disease without heart failure 02/05/2011  . Hyperlipidemia 02/05/2011  . Hypothyroidism 02/05/2011  . Palpitations 02/05/2011    History  Smoking status  . Former Smoker  . Quit date: 01/30/1986  Smokeless tobacco  . Never Used    History  Alcohol Use  . Yes    Comment: one glass of wine a week    Family History  Problem Relation Age of Onset  . Hypertension Mother   . Heart disease Mother   . Hypertension Father   . Heart disease Father   . Arthritis Father   . Diabetes Father     Review of Systems: Constitutional: no fever chills diaphoresis or fatigue or change  in weight.  Head and neck: no hearing loss, no epistaxis, no photophobia or visual disturbance. Respiratory: No cough, shortness of breath or wheezing. Cardiovascular: No chest pain peripheral edema, palpitations. Gastrointestinal: No abdominal distention, no abdominal pain, no change in bowel habits hematochezia or melena. Genitourinary: No dysuria, no frequency, no urgency, no nocturia. Musculoskeletal:No arthralgias, no back pain, no gait disturbance or myalgias. Neurological: No dizziness, no headaches, no numbness, no seizures, no syncope, no weakness, no tremors. Hematologic: No lymphadenopathy, no easy bruising. Psychiatric: No confusion, no hallucinations, no sleep disturbance.    Physical Exam: Filed Vitals:   01/20/14 0854  BP: 123/75  Pulse: 80    the general appearance reveals a well-developed well-nourished woman in no distress.The head and neck exam reveals pupils equal and reactive.  Extraocular movements are full.  There is no scleral icterus.  The mouth and pharynx are normal.  The neck is supple.  The carotids reveal no bruits.  The jugular venous pressure is normal.  The  thyroid is not enlarged.  There is no lymphadenopathy.  The chest is clear to percussion and auscultation.  There are no rales or rhonchi.  Expansion of the chest is symmetrical.  The precordium is quiet.  The first heart sound is normal.  The second heart sound is physiologically split.  There is no murmur gallop rub.  There is a faint apical midsystolic click.  There is no abnormal lift or heave.  The abdomen is soft and nontender.  The bowel sounds are normal.  The liver and spleen are not enlarged.  There are no abdominal masses.  There are no abdominal bruits.  Extremities reveal good pedal pulses.  There is no phlebitis or edema.  There is no cyanosis or clubbing.  Strength is normal and symmetrical in all extremities.  There is no lateralizing weakness.  There are no sensory deficits.  The skin is warm  and dry.  There is no rash.    Assessment / Plan: 1.  Benign hypertensive heart disease without heart failure 2. Hyperlipidemia 3. hyperglycemia with peripheral neuropathy 4. atypical chest pain. 5.  Hypothyroidism 6. tinnitus followed by ENT  Plan: Continue same medication recheck in 4 months for office visit EKG lipid panel hepatic function panel basal metabolic panel TSH and E5U

## 2014-01-20 NOTE — Patient Instructions (Addendum)
Your physician recommends that you continue on your current medications as directed. Please refer to the Current Medication list given to you today.  Your physician wants you to follow-up in: 4 months with fasting labs (lp/bmet/hfp/a1c/tsh) and ekg  You will receive a reminder letter in the mail two months in advance. If you don't receive a letter, please call our office to schedule the follow-up appointment.

## 2014-01-20 NOTE — Assessment & Plan Note (Signed)
She has a history of hyperlipidemia.  We reviewed her blood work today which is satisfactory.  Her weight is down another 2 pounds.  She is aiming for an eventual weight of 150 pounds which would be good for her.

## 2014-01-20 NOTE — Assessment & Plan Note (Signed)
Recently the patient had an episode of marked hypertension during the night.  She was under stress that day and also taken 6 Advil earlier that day.  She checked her blood pressure at home and was in the range of 338 systolic.  She called the doctor on call who advised taking an extra Xanax and an extra metoprolol.  Over the next several hours her pulse and blood pressure returned to normal.  Talked about the fact that if she has a spike in her blood pressure she should take extra Xanax and extra metoprolol going forward.

## 2014-01-20 NOTE — Assessment & Plan Note (Signed)
She remains on gabapentin for polyneuropathy.  She is not certain that it is helping but it does help her rest at night.  We will continue same dose of gabapentin

## 2014-02-08 ENCOUNTER — Ambulatory Visit: Payer: Self-pay | Admitting: Neurology

## 2014-02-24 ENCOUNTER — Telehealth: Payer: Self-pay | Admitting: Cardiology

## 2014-02-24 ENCOUNTER — Other Ambulatory Visit: Payer: Self-pay | Admitting: *Deleted

## 2014-02-24 MED ORDER — ROSUVASTATIN CALCIUM 20 MG PO TABS: ORAL_TABLET | ORAL | Status: DC

## 2014-02-24 NOTE — Telephone Encounter (Signed)
New message     Pt request to talk to Melinda--not urgent--she want [place] only.  Pt is aware she is out of the office today.  OK to call tomorrow or whenever she returns  She would not tell me what she wanted.

## 2014-02-24 NOTE — Telephone Encounter (Signed)
Will forward to Alvina Filbert LPN for further review

## 2014-02-24 NOTE — Telephone Encounter (Signed)
Patient stated that she gets the crestor 20mg  and cuts this into fourths to equal her 5mg  daily dose.

## 2014-02-25 MED ORDER — ROSUVASTATIN CALCIUM 5 MG PO TABS: 5.0000 mg | ORAL_TABLET | Freq: Every day | ORAL | Status: DC

## 2014-02-25 NOTE — Telephone Encounter (Signed)
Patient wanting samples of Crestor, advised no samples at this time. Patient aware

## 2014-02-25 NOTE — Telephone Encounter (Signed)
Patient wanted to change her mg from 20 mg (1/4 tablet daily) to 5 mg daily. Rx sent to [facility]

## 2014-02-26 ENCOUNTER — Other Ambulatory Visit: Payer: Self-pay

## 2014-03-08 ENCOUNTER — Telehealth: Payer: Self-pay

## 2014-03-08 NOTE — Telephone Encounter (Signed)
Patient called for samples of crestor 5 mg gave her 10 mg of crestor placed them up front

## 2014-04-03 ENCOUNTER — Other Ambulatory Visit: Payer: Self-pay | Admitting: Cardiology

## 2014-04-22 ENCOUNTER — Other Ambulatory Visit: Payer: Self-pay | Admitting: Cardiology

## 2014-05-21 ENCOUNTER — Other Ambulatory Visit: Payer: Medicare Other

## 2014-05-24 ENCOUNTER — Telehealth: Payer: Self-pay | Admitting: *Deleted

## 2014-05-24 NOTE — Telephone Encounter (Signed)
Crestor samples placed at the front desk for pick up. 

## 2014-05-25 ENCOUNTER — Ambulatory Visit: Payer: Medicare Other | Admitting: Cardiology

## 2014-05-25 NOTE — Telephone Encounter (Signed)
error 

## 2014-06-03 ENCOUNTER — Other Ambulatory Visit: Payer: Medicare Other

## 2014-06-04 ENCOUNTER — Other Ambulatory Visit: Payer: Self-pay | Admitting: Cardiology

## 2014-06-07 ENCOUNTER — Ambulatory Visit: Payer: Medicare Other | Admitting: Cardiology

## 2014-06-11 ENCOUNTER — Other Ambulatory Visit: Payer: Self-pay | Admitting: *Deleted

## 2014-06-16 ENCOUNTER — Other Ambulatory Visit: Payer: Self-pay | Admitting: *Deleted

## 2014-06-16 ENCOUNTER — Encounter: Payer: Self-pay | Admitting: Cardiology

## 2014-06-16 DIAGNOSIS — E039 Hypothyroidism, unspecified: Secondary | ICD-10-CM

## 2014-06-18 ENCOUNTER — Other Ambulatory Visit: Payer: Medicare Other

## 2014-06-18 ENCOUNTER — Other Ambulatory Visit (INDEPENDENT_AMBULATORY_CARE_PROVIDER_SITE_OTHER): Payer: Medicare Other

## 2014-06-18 ENCOUNTER — Telehealth: Payer: Self-pay

## 2014-06-18 DIAGNOSIS — E039 Hypothyroidism, unspecified: Secondary | ICD-10-CM

## 2014-06-18 DIAGNOSIS — E785 Hyperlipidemia, unspecified: Secondary | ICD-10-CM

## 2014-06-18 DIAGNOSIS — I119 Hypertensive heart disease without heart failure: Secondary | ICD-10-CM

## 2014-06-18 DIAGNOSIS — R7309 Other abnormal glucose: Secondary | ICD-10-CM

## 2014-06-18 DIAGNOSIS — R739 Hyperglycemia, unspecified: Secondary | ICD-10-CM

## 2014-06-18 LAB — HEPATIC FUNCTION PANEL
ALT: 32 U/L (ref 0–35)
AST: 28 U/L (ref 0–37)
Albumin: 4 g/dL (ref 3.5–5.2)
Alkaline Phosphatase: 52 U/L (ref 39–117)
BILIRUBIN DIRECT: 0.1 mg/dL (ref 0.0–0.3)
Total Bilirubin: 0.8 mg/dL (ref 0.2–1.2)
Total Protein: 7.4 g/dL (ref 6.0–8.3)

## 2014-06-18 LAB — BASIC METABOLIC PANEL
BUN: 11 mg/dL (ref 6–23)
CALCIUM: 9.5 mg/dL (ref 8.4–10.5)
CHLORIDE: 103 meq/L (ref 96–112)
CO2: 28 mEq/L (ref 19–32)
Creatinine, Ser: 0.7 mg/dL (ref 0.4–1.2)
GFR: 95.18 mL/min (ref >60.00)
Glucose, Bld: 113 mg/dL — ABNORMAL HIGH (ref 70–99)
Potassium: 4 mEq/L (ref 3.5–5.1)
Sodium: 139 mEq/L (ref 135–145)

## 2014-06-18 LAB — LIPID PANEL
CHOLESTEROL: 148 mg/dL (ref 0–200)
HDL: 32.3 mg/dL — ABNORMAL LOW (ref >39.00)
LDL Cholesterol: 94 mg/dL (ref 0–99)
NonHDL: 115.7
Total CHOL/HDL Ratio: 5
Triglycerides: 111 mg/dL (ref 0.0–149.0)
VLDL: 22.2 mg/dL (ref 0.0–40.0)

## 2014-06-18 LAB — TSH: TSH: 0.43 u[IU]/mL (ref 0.35–4.50)

## 2014-06-18 LAB — T4, FREE: Free T4: 1.44 ng/dL (ref 0.60–1.60)

## 2014-06-18 LAB — T3: T3, Total: 105.1 ng/dL (ref 80.0–204.0)

## 2014-06-18 LAB — HEMOGLOBIN A1C: Hgb A1c MFr Bld: 6.6 % — ABNORMAL HIGH (ref 4.6–6.5)

## 2014-06-18 NOTE — Progress Notes (Signed)
Quick Note:  Please make copy of labs for patient visit. ______ 

## 2014-06-18 NOTE — Telephone Encounter (Signed)
Patient  Called to get samples of crestor place a month of samples up front

## 2014-06-20 NOTE — Progress Notes (Signed)
Quick Note:  Please make copy of labs for patient visit. ______ 

## 2014-06-23 ENCOUNTER — Ambulatory Visit: Payer: Medicare Other | Admitting: Cardiology

## 2014-07-05 ENCOUNTER — Other Ambulatory Visit: Payer: Self-pay | Admitting: *Deleted

## 2014-07-05 ENCOUNTER — Telehealth: Payer: Self-pay | Admitting: Cardiology

## 2014-07-05 MED ORDER — GABAPENTIN 300 MG PO CAPS: 300.0000 mg | ORAL_CAPSULE | Freq: Every day | ORAL | Status: DC

## 2014-07-05 MED ORDER — FUROSEMIDE 20 MG PO TABS: ORAL_TABLET | ORAL | Status: DC

## 2014-07-05 NOTE — Telephone Encounter (Signed)
Patient would like a 90 day supply. Thanks, MI

## 2014-07-05 NOTE — Telephone Encounter (Signed)
Sent labs as requested, patient aware

## 2014-07-05 NOTE — Telephone Encounter (Signed)
New message    Patient calling send current  blood work result to Dr. Reynold Bowen fax # [phone].

## 2014-07-13 ENCOUNTER — Telehealth: Payer: Self-pay | Admitting: Neurology

## 2014-07-13 ENCOUNTER — Ambulatory Visit (INDEPENDENT_AMBULATORY_CARE_PROVIDER_SITE_OTHER): Payer: Medicare Other | Admitting: Neurology

## 2014-07-13 ENCOUNTER — Encounter: Payer: Self-pay | Admitting: Neurology

## 2014-07-13 VITALS — BP 143/79 | HR 86 | Ht 67.0 in | Wt 171.6 lb

## 2014-07-13 DIAGNOSIS — G63 Polyneuropathy in diseases classified elsewhere: Secondary | ICD-10-CM

## 2014-07-13 NOTE — Progress Notes (Signed)
Reason for visit: Peripheral neuropathy  Yvonne [name] is an 68 y.o. female  History of present illness:  Yvonne [name] is a 68 year old right-handed white female with history of diet-controlled diabetes and a mild peripheral neuropathy. The patient has had symptoms for greater than a decade, and her sensory alteration includes the feet only, not any symptoms above the ankles on either side. The patient has good days and bad days with the neuropathy. The sheets touching her feet are uncomfortable. The patient denies any balance issues. She is on gabapentin taking 300 mg at night, and she gets about 4 hours of sleep with this. The patient has been hesitant to go up on the dose. She also has a lot of degenerative arthritis of the knees, and she has required shots in the knees. The patient has an itching sensation in the medial aspect of the left shoulder blade that has been a chronic issue for her. The patient indicates that her last hemoglobin A1c was 6.6. She returns for an evaluation.  Past Medical History  Diagnosis Date  . Hypertension   . Hyperlipidemia   . Hypothyroidism   . Heart palpitations   . LVH (left ventricular hypertrophy)     per echo in 2011  . History of diastolic dysfunction     per echo in 2011  . Anxiety   . Chest pressure   . Degenerative arthritis   . Sleep difficulties   . Normal nuclear stress test 2011  . Polyneuropathy in other diseases classified elsewhere 05/21/2013  . Diabetes mellitus without complication     diet controlled  . Tinnitus     Past Surgical History  Procedure Laterality Date  . Cardiovascular stress test  11/09/2009    EF 75%  . Transthoracic echocardiogram  11/01/2009    NORMAL LV FILLING AND DIASTOLIC DYSFUNCTION AND MILD AORTIC SCLEROSIS AND TRACE MITRAL REGURGITATION  . Appendectomy    . Carpal tunnel release      bilateral    Family History  Problem Relation Age of Onset  . Hypertension Mother   . Heart disease Mother     . Hypertension Father   . Heart disease Father   . Arthritis Father   . Diabetes Father     Social history:  reports that she quit smoking about 28 years ago. She has never used smokeless tobacco. She reports that she drinks alcohol. She reports that she does not use illicit drugs.    Allergies  Allergen Reactions  . Erythromycin   . Tobramycin     Medications:  Current Outpatient Prescriptions on File Prior to Visit  Medication Sig Dispense Refill  . ALPRAZolam (XANAX) 0.25 MG tablet TAKE ONE TABLET BY MOUTH EVERY 8 HOURS AS NEEDED 30 tablet 5  . aspirin 81 MG tablet Take 81 mg by mouth daily.      . fluticasone (FLONASE) 50 MCG/ACT nasal spray INSTILL TWO SPRAYS INTO EACH NOSTRIL ONCE A DAY 16 g 0  . FLUZONE HIGH-DOSE injection Inject 0.05 mLs into the muscle.    . furosemide (LASIX) 20 MG tablet TAKE 1/2 TABLET BY MOUTH DAILY AS DIRECTED 45 tablet 0  . gabapentin (NEURONTIN) 300 MG capsule Take 1 capsule (300 mg total) by mouth daily. 90 capsule 3  . levothyroxine (SYNTHROID, LEVOTHROID) 125 MCG tablet Take 125 mcg by mouth daily. DAILY EXCEPT SUNDAYS    . metoprolol succinate (TOPROL-XL) 25 MG 24 hr tablet TAKE 1/2 TABLET BY MOUTH DAILY. 45 tablet 1  .  omeprazole (PRILOSEC) 10 MG capsule Take 10 mg by mouth as needed.    . potassium chloride SA (K-DUR,KLOR-CON) 20 MEQ tablet TAKE 1/2 TABLET BY MOUTH EVERY DAY 34 tablet 0  . Prenatal Vit-Fe Fumarate-FA (MULTIVITAMIN-PRENATAL) 27-0.8 MG TABS Take 1 tablet by mouth daily.    . rosuvastatin (CRESTOR) 5 MG tablet Take 1 tablet (5 mg total) by mouth daily. 30 tablet 5   No current facility-administered medications on file prior to visit.    ROS:  Out of a complete 14 system review of symptoms, the patient complains only of the following symptoms, and all other reviewed systems are negative.  Ringing in the ears Frequent waking, snoring Degenerative arthritis  Blood pressure 143/79, pulse 86, height 5\' 7"  (1.702 m), weight  171 lb 9.6 oz (77.837 kg).  Physical Exam  General: The patient is alert and cooperative at the time of the examination.  Skin: No significant peripheral edema is noted.   Neurologic Exam  Mental status: The patient is oriented x 3.  Cranial nerves: Facial symmetry is present. Speech is normal, no aphasia or dysarthria is noted. Extraocular movements are full. Visual fields are full.  Motor: The patient has good strength in all 4 extremities.  Sensory examination: No evidence of stocking pattern pinprick sensory deficit is noted in the legs.  Coordination: The patient has good finger-nose-finger and heel-to-shin bilaterally.  Gait and station: The patient has a normal gait. Tandem gait is normal. Romberg is negative. No drift is seen.  Reflexes: Deep tendon reflexes are symmetric.   Assessment/Plan:  1. Diet-controlled diabetes  2. Peripheral neuropathy  The patient is to go up on the gabapentin taking 600 mg at night. She wishes to try a topical ointment for the peripheral neuropathy, and a prescription will be written for this. She will follow-up otherwise in one year, or sooner if needed.  Jill Alexanders MD 07/13/2014 4:53 PM  [facility] [address] [place], [place] [address]  Phone [phone] Fax [phone]

## 2014-07-13 NOTE — Telephone Encounter (Signed)
Patient was seen this morning and forgot to ask Dr. Jannifer Franklin as patient works on A1C to lower would it reverse the neuropathy or just slow it down. It is ok to leave a message. Thank you.

## 2014-07-13 NOTE — Patient Instructions (Signed)

## 2014-07-13 NOTE — Telephone Encounter (Signed)
I called the patient. Good control diabetes will slow down progression of the neuropathy, it generally does not reverse the peripheral neuropathy in type II diabetics.

## 2014-07-15 ENCOUNTER — Encounter: Payer: Self-pay | Admitting: Cardiology

## 2014-07-15 ENCOUNTER — Ambulatory Visit (INDEPENDENT_AMBULATORY_CARE_PROVIDER_SITE_OTHER): Payer: Medicare Other | Admitting: Cardiology

## 2014-07-15 VITALS — BP 152/80 | HR 81 | Ht 67.0 in | Wt 168.0 lb

## 2014-07-15 DIAGNOSIS — R739 Hyperglycemia, unspecified: Secondary | ICD-10-CM

## 2014-07-15 DIAGNOSIS — I119 Hypertensive heart disease without heart failure: Secondary | ICD-10-CM

## 2014-07-15 DIAGNOSIS — E785 Hyperlipidemia, unspecified: Secondary | ICD-10-CM

## 2014-07-15 DIAGNOSIS — G63 Polyneuropathy in diseases classified elsewhere: Secondary | ICD-10-CM

## 2014-07-15 DIAGNOSIS — Z8639 Personal history of other endocrine, nutritional and metabolic disease: Secondary | ICD-10-CM

## 2014-07-15 DIAGNOSIS — F419 Anxiety disorder, unspecified: Secondary | ICD-10-CM

## 2014-07-15 DIAGNOSIS — E038 Other specified hypothyroidism: Secondary | ICD-10-CM

## 2014-07-15 DIAGNOSIS — R7309 Other abnormal glucose: Secondary | ICD-10-CM

## 2014-07-15 MED ORDER — POTASSIUM CHLORIDE CRYS ER 20 MEQ PO TBCR: 10.0000 meq | EXTENDED_RELEASE_TABLET | Freq: Every day | ORAL | Status: DC

## 2014-07-15 MED ORDER — ALPRAZOLAM 0.25 MG PO TABS: ORAL_TABLET | ORAL | Status: DC

## 2014-07-15 MED ORDER — GABAPENTIN 300 MG PO CAPS: ORAL_CAPSULE | ORAL | Status: DC

## 2014-07-15 NOTE — Assessment & Plan Note (Signed)
No dizzy spells or syncope.  No severe headaches.  No increased dyspnea.  She has gained weight since last visit

## 2014-07-15 NOTE — Assessment & Plan Note (Signed)
We reviewed her recent lipids.  HDL has fallen reflecting the fact that she has not been exercising as much as she should

## 2014-07-15 NOTE — Assessment & Plan Note (Signed)
Her peripheral neuropathy of the feet has been worse and she will increase her gabapentin to 600 mg at bedtime

## 2014-07-15 NOTE — Patient Instructions (Signed)
INCREASE YOUR GABAPENTIN TO 2 AT BEDTIME  Your physician wants you to follow-up in: 4 months with fasting labs (lp/bmet/hfp/a1c/tsh/vit d) You will receive a reminder letter in the mail two months in advance. If you don't receive a letter, please call our office to schedule the follow-up appointment.

## 2014-07-15 NOTE — Progress Notes (Signed)
Yvonne [name] Date of Birth:  [birth year] [facility] [address] [place], [place]  [address] [phone]        Fax   [phone]   History of Present Illness:  This pleasant 68 year old woman is seen for a scheduled four-month followup office visit. She has a past history of essential hypertension and history of hypercholesterolemia and hypothyroidism. 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 Since last visit the patient has been doing well. She was seen as a work in on 12/28/11 because of chest pain and indigestion. She underwent a nuclear stress test which showed no evidence of ischemia and showed normal left ventricular systolic function.  Her ejection fraction was 77%. She has a very strong history of vascular disease in her family. Multiple cousins have had strokes and heart attacks. Her own parents were both diabetic and had severe vascular disease. The patient herself has a history of high blood pressure and hyperlipidemia and obesity. She has had mild hyperglycemia in the past and her hemoglobin A1c has been running slightly elevated. Since last visit she has been making a good effort to get regular exercise and to remain on a heart healthy diet. The patient has a history of mild to moderate idiopathic peripheral neuropathy and is followed by Dr. Jannifer Franklin.    Current Outpatient Prescriptions  Medication Sig Dispense Refill  . ALPRAZolam (XANAX) 0.25 MG tablet TAKE ONE TABLET BY MOUTH EVERY 8 HOURS AS NEEDED 30 tablet 5  . aspirin 81 MG tablet Take 81 mg by mouth daily.      . cholecalciferol (VITAMIN D) 1000 UNITS tablet Take 1,000 Units by mouth daily.    . fluticasone (FLONASE) 50 MCG/ACT nasal spray INSTILL TWO SPRAYS INTO EACH NOSTRIL ONCE A DAY 16 g 0  . FLUZONE HIGH-DOSE injection Inject 0.05 mLs into the muscle.    . furosemide (LASIX) 20 MG  tablet TAKE 1/2 TABLET BY MOUTH DAILY AS DIRECTED 45 tablet 0  . gabapentin (NEURONTIN) 300 MG capsule 2 caps by mouth at bedtime 180 capsule 3  . levothyroxine (SYNTHROID, LEVOTHROID) 125 MCG tablet Take 125 mcg by mouth daily. DAILY EXCEPT SUNDAYS    . metoprolol succinate (TOPROL-XL) 25 MG 24 hr tablet TAKE 1/2 TABLET BY MOUTH DAILY. 45 tablet 1  . omeprazole (PRILOSEC) 10 MG capsule Take 10 mg by mouth as needed.    . potassium chloride SA (K-DUR,KLOR-CON) 20 MEQ tablet Take 0.5 tablets (10 mEq total) by mouth daily. 45 tablet 3  . Prenatal Vit-Fe Fumarate-FA (MULTIVITAMIN-PRENATAL) 27-0.8 MG TABS Take 1 tablet by mouth daily.    . rosuvastatin (CRESTOR) 5 MG tablet Take 1 tablet (5 mg total) by mouth daily. 30 tablet 5   No current facility-administered medications for this visit.    Allergies  Allergen Reactions  . Erythromycin   . Tobramycin     Patient Active Problem List   Diagnosis Date Noted  . Polyneuropathy in other diseases classified elsewhere 05/21/2013  . History of hyperglycemia 04/01/2012  . Atypical chest pain 12/28/2011  . Benign hypertensive heart disease without heart failure 02/05/2011  . Hyperlipidemia 02/05/2011  . Hypothyroidism 02/05/2011  . Palpitations 02/05/2011    History  Smoking status  . Former Smoker  . Quit date: 01/30/1986  Smokeless tobacco  . Never Used    History  Alcohol Use  .  Yes    Comment: one glass of wine a week    Family History  Problem Relation Age of Onset  . Hypertension Mother   . Heart disease Mother   . Hypertension Father   . Heart disease Father   . Arthritis Father   . Diabetes Father     Review of Systems: Constitutional: no fever chills diaphoresis or fatigue or change in weight.  Head and neck: no hearing loss, no epistaxis, no photophobia or visual disturbance. Respiratory: No cough, shortness of breath or wheezing. Cardiovascular: No chest pain peripheral edema, palpitations. Gastrointestinal: No  abdominal distention, no abdominal pain, no change in bowel habits hematochezia or melena. Genitourinary: No dysuria, no frequency, no urgency, no nocturia. Musculoskeletal:No arthralgias, no back pain, no gait disturbance or myalgias. Neurological: No dizziness, no headaches, no numbness, no seizures, no syncope, no weakness, no tremors. Hematologic: No lymphadenopathy, no easy bruising. Psychiatric: No confusion, no hallucinations, no sleep disturbance.    Physical Exam: Filed Vitals:   07/15/14 1328  BP: 152/80  Pulse: 81    the general appearance reveals a well-developed well-nourished woman in no distress.The head and neck exam reveals pupils equal and reactive.  Extraocular movements are full.  There is no scleral icterus.  The mouth and pharynx are normal.  The neck is supple.  The carotids reveal no bruits.  The jugular venous pressure is normal.  The  thyroid is not enlarged.  There is no lymphadenopathy.  The chest is clear to percussion and auscultation.  There are no rales or rhonchi.  Expansion of the chest is symmetrical.  The precordium is quiet.  The first heart sound is normal.  The second heart sound is physiologically split.  There is no murmur gallop rub.  There is a faint apical midsystolic click.  There is no abnormal lift or heave.  The abdomen is soft and nontender.  The bowel sounds are normal.  The liver and spleen are not enlarged.  There are no abdominal masses.  There are no abdominal bruits.  Extremities reveal good pedal pulses.  There is no phlebitis or edema.  There is no cyanosis or clubbing.  Strength is normal and symmetrical in all extremities.  There is no lateralizing weakness.  There are no sensory deficits.  The skin is warm and dry.  There is no rash.    Assessment / Plan: 1.  Benign hypertensive heart disease without heart failure 2. Hyperlipidemia 3. hyperglycemia with peripheral neuropathy 4. atypical chest pain. 5.  Hypothyroidism 6. tinnitus  followed by ENT  Plan: Continue same medication.  recheck in 4 months for office visit  lipid panel hepatic function panel basal metabolic panel TSH and V4U and vitamin D level.  She has a past history of low vitamin D

## 2014-08-30 ENCOUNTER — Telehealth: Payer: Self-pay | Admitting: *Deleted

## 2014-08-30 NOTE — Telephone Encounter (Signed)
Crestor samples placed at the front desk for patient. 

## 2014-10-19 ENCOUNTER — Telehealth: Payer: Self-pay

## 2014-10-19 NOTE — Telephone Encounter (Signed)
Patient called for samples of crestor placed samples up front

## 2014-10-20 ENCOUNTER — Other Ambulatory Visit: Payer: Self-pay | Admitting: Cardiology

## 2014-11-12 ENCOUNTER — Other Ambulatory Visit: Payer: Self-pay

## 2014-11-12 ENCOUNTER — Other Ambulatory Visit (INDEPENDENT_AMBULATORY_CARE_PROVIDER_SITE_OTHER): Payer: Medicare Other

## 2014-11-12 DIAGNOSIS — Z8639 Personal history of other endocrine, nutritional and metabolic disease: Secondary | ICD-10-CM

## 2014-11-12 DIAGNOSIS — E038 Other specified hypothyroidism: Secondary | ICD-10-CM

## 2014-11-12 DIAGNOSIS — I119 Hypertensive heart disease without heart failure: Secondary | ICD-10-CM | POA: Diagnosis not present

## 2014-11-12 DIAGNOSIS — E785 Hyperlipidemia, unspecified: Secondary | ICD-10-CM

## 2014-11-12 DIAGNOSIS — R739 Hyperglycemia, unspecified: Secondary | ICD-10-CM

## 2014-11-12 DIAGNOSIS — R7309 Other abnormal glucose: Secondary | ICD-10-CM | POA: Diagnosis not present

## 2014-11-12 LAB — HEPATIC FUNCTION PANEL
ALBUMIN: 4.3 g/dL (ref 3.5–5.2)
ALT: 25 U/L (ref 0–35)
AST: 23 U/L (ref 0–37)
Alkaline Phosphatase: 57 U/L (ref 39–117)
Bilirubin, Direct: 0.2 mg/dL (ref 0.0–0.3)
Total Bilirubin: 0.7 mg/dL (ref 0.2–1.2)
Total Protein: 7.2 g/dL (ref 6.0–8.3)

## 2014-11-12 LAB — BASIC METABOLIC PANEL
BUN: 15 mg/dL (ref 6–23)
CHLORIDE: 102 meq/L (ref 96–112)
CO2: 31 mEq/L (ref 19–32)
Calcium: 9.7 mg/dL (ref 8.4–10.5)
Creatinine, Ser: 0.74 mg/dL (ref 0.40–1.20)
GFR: 83.31 mL/min (ref >60.00)
Glucose, Bld: 130 mg/dL — ABNORMAL HIGH (ref 70–99)
POTASSIUM: 3.9 meq/L (ref 3.5–5.1)
SODIUM: 138 meq/L (ref 135–145)

## 2014-11-12 LAB — LIPID PANEL
CHOLESTEROL: 154 mg/dL (ref 0–200)
HDL: 34.5 mg/dL — ABNORMAL LOW (ref >39.00)
LDL Cholesterol: 93 mg/dL (ref 0–99)
NonHDL: 119.5
Total CHOL/HDL Ratio: 4
Triglycerides: 131 mg/dL (ref 0.0–149.0)
VLDL: 26.2 mg/dL (ref 0.0–40.0)

## 2014-11-12 LAB — HEMOGLOBIN A1C: Hgb A1c MFr Bld: 7.2 % — ABNORMAL HIGH (ref 4.6–6.5)

## 2014-11-12 LAB — VITAMIN D 25 HYDROXY (VIT D DEFICIENCY, FRACTURES): VITD: 36.15 ng/mL (ref 30.00–100.00)

## 2014-11-12 LAB — TSH: TSH: 0.74 u[IU]/mL (ref 0.35–4.50)

## 2014-11-13 NOTE — Progress Notes (Signed)
Quick Note:  Please make copy of labs for patient visit. ______ 

## 2014-11-18 ENCOUNTER — Ambulatory Visit: Payer: Self-pay | Admitting: Cardiology

## 2014-11-23 ENCOUNTER — Encounter: Payer: Self-pay | Admitting: Cardiology

## 2014-11-23 ENCOUNTER — Ambulatory Visit (INDEPENDENT_AMBULATORY_CARE_PROVIDER_SITE_OTHER): Payer: Medicare Other | Admitting: Cardiology

## 2014-11-23 VITALS — BP 140/90 | HR 54 | Ht 67.0 in | Wt 167.0 lb

## 2014-11-23 DIAGNOSIS — I119 Hypertensive heart disease without heart failure: Secondary | ICD-10-CM

## 2014-11-23 DIAGNOSIS — E785 Hyperlipidemia, unspecified: Secondary | ICD-10-CM | POA: Diagnosis not present

## 2014-11-23 DIAGNOSIS — R739 Hyperglycemia, unspecified: Secondary | ICD-10-CM

## 2014-11-23 DIAGNOSIS — R7309 Other abnormal glucose: Secondary | ICD-10-CM | POA: Diagnosis not present

## 2014-11-23 DIAGNOSIS — F419 Anxiety disorder, unspecified: Secondary | ICD-10-CM

## 2014-11-23 NOTE — Progress Notes (Signed)
Cardiology Office Note   Date:  11/23/2014   ID:  [name], Yvonne [name], MRN [id number]  PCP:  Tawanna Solo, MD  Cardiologist:   Darlin Coco, MD   No chief complaint on file.     History of Present Illness: Yvonne [name] is a 69 y.o. female who presents for a four-month follow-up office visit  This pleasant 69 year old woman is seen for a scheduled four-month followup office visit. She has a past history of essential hypertension and history of hypercholesterolemia and hypothyroidism. 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 Since last visit the patient has been doing well. She was seen as a work in on 12/28/11 because of chest pain and indigestion. She underwent a nuclear stress test which showed no evidence of ischemia and showed normal left ventricular systolic function. Her ejection fraction was 77%. She has a very strong history of vascular disease in her family. Multiple cousins have had strokes and heart attacks. Her own parents were both diabetic and had severe vascular disease. The patient herself has a history of high blood pressure and hyperlipidemia and obesity. She has had mild hyperglycemia in the past and her hemoglobin A1c has been running slightly elevated. Since last visit she has been making a good effort to get regular exercise and to remain on a heart healthy diet. The patient has a history of mild to moderate idiopathic peripheral neuropathy and is followed by Dr. Jannifer Franklin.  Since last visit she has been under more stress.  Her 57 year old nephew has been diagnosed with widespread neuroendocrine cancer.  He lives in [place].  She has been reacting to this stress by eating more comfort food and she has not been exercising regularly. He has not been having any cardiac-type chest pain.  She has had increased indigestion.  Her hemoglobin A1c  has been higher.  She's had occasional hypoglycemic episodes.  Past Medical History  Diagnosis Date  . Hypertension   . Hyperlipidemia   . Hypothyroidism   . Heart palpitations   . LVH (left ventricular hypertrophy)     per echo in 2011  . History of diastolic dysfunction     per echo in 2011  . Anxiety   . Chest pressure   . Degenerative arthritis   . Sleep difficulties   . Normal nuclear stress test 2011  . Polyneuropathy in other diseases classified elsewhere 05/21/2013  . Diabetes mellitus without complication     diet controlled  . Tinnitus     Past Surgical History  Procedure Laterality Date  . Cardiovascular stress test  11/09/2009    EF 75%  . Transthoracic echocardiogram  11/01/2009    NORMAL LV FILLING AND DIASTOLIC DYSFUNCTION AND MILD AORTIC SCLEROSIS AND TRACE MITRAL REGURGITATION  . Appendectomy    . Carpal tunnel release      bilateral     Current Outpatient Prescriptions  Medication Sig Dispense Refill  . ALPRAZolam (XANAX) 0.25 MG tablet TAKE ONE TABLET BY MOUTH EVERY 8 HOURS AS NEEDED 90 tablet 3  . aspirin 81 MG tablet Take 81 mg by mouth daily.      . cholecalciferol (VITAMIN D) 1000 UNITS tablet Take 1,000 Units by mouth daily.    . fluticasone (FLONASE) 50 MCG/ACT nasal spray INSTILL TWO SPRAYS INTO EACH NOSTRIL ONCE A DAY 16 g 0  . FLUZONE HIGH-DOSE injection Inject 0.05  mLs into the muscle.    . furosemide (LASIX) 20 MG tablet TAKE 1/2 TABLET BY MOUTH DAILY AS DIRECTED 45 tablet 0  . gabapentin (NEURONTIN) 300 MG capsule 2 caps by mouth at bedtime (Patient taking differently: Take 300 mg by mouth daily. 2 caps by mouth at bedtime) 180 capsule 3  . levothyroxine (SYNTHROID, LEVOTHROID) 125 MCG tablet Take 125 mcg by mouth daily. DAILY EXCEPT SUNDAYS    . metoprolol succinate (TOPROL-XL) 25 MG 24 hr tablet TAKE 1/2 TABLET BY MOUTH DAILY. 45 tablet 1  . omeprazole (PRILOSEC) 10 MG capsule Take 10 mg by mouth as needed.    . potassium chloride SA  (K-DUR,KLOR-CON) 20 MEQ tablet Take 0.5 tablets (10 mEq total) by mouth daily. 45 tablet 3  . Prenatal Vit-Fe Fumarate-FA (MULTIVITAMIN-PRENATAL) 27-0.8 MG TABS Take 1 tablet by mouth daily.    . rosuvastatin (CRESTOR) 5 MG tablet Take 1 tablet (5 mg total) by mouth daily. 30 tablet 5   No current facility-administered medications for this visit.    Allergies:   Erythromycin and Tobramycin    Social History:  The patient  reports that she quit smoking about 28 years ago. She has never used smokeless tobacco. She reports that she drinks alcohol. She reports that she does not use illicit drugs.   Family History:  The patient's family history includes Arthritis in her father; Diabetes in her father; Heart disease in her father and mother; Hypertension in her father and mother.    ROS:  Please see the history of present illness.   Otherwise, review of systems are positive for none.   All other systems are reviewed and negative.    PHYSICAL EXAM: VS:  BP 140/90 mmHg  Pulse 54  Ht 5\' 7"  (1.702 m)  Wt 167 lb (75.751 kg)  BMI 26.15 kg/m2 , BMI Body mass index is 26.15 kg/(m^2). GEN: Well nourished, well developed, in no acute distress HEENT: normal Neck: no JVD, carotid bruits, or masses Cardiac: RRR; no murmurs, rubs, or gallops,no edema  Respiratory:  clear to auscultation bilaterally, normal work of breathing GI: soft, nontender, nondistended, + BS MS: no deformity or atrophy Skin: warm and dry, no rash Neuro:  Strength and sensation are intact Psych: euthymic mood, full affect   EKG:  EKG is not ordered today.    Recent Labs: 11/12/2014: ALT 25; BUN 15; Creatinine 0.74; Potassium 3.9; Sodium 138; TSH 0.74    Lipid Panel    Component Value Date/Time   CHOL 154 11/12/2014 0738   TRIG 131.0 11/12/2014 0738   HDL 34.50* 11/12/2014 0738   CHOLHDL 4 11/12/2014 0738   VLDL 26.2 11/12/2014 0738   LDLCALC 93 11/12/2014 0738      Wt Readings from Last 3 Encounters:  11/23/14  167 lb (75.751 kg)  07/15/14 168 lb (76.204 kg)  07/13/14 171 lb 9.6 oz (77.837 kg)        ASSESSMENT AND PLAN:  1. Benign hypertensive heart disease without heart failure 2. Hyperlipidemia 3. hyperglycemia with peripheral neuropathy 4. atypical chest pain. 5. Hypothyroidism 6.  Situational stress related to her nephew's illness  Plan: Continue same medication. recheck in 4 months for office visit lipid panel hepatic function panel basal metabolic panel TSH and Q2I  level. She has a past history of low vitamin D but her recent vitamin D level was normal and she will continue to take low-dose vitamin D daily   Current medicines are reviewed at length with the patient today.  The patient does not have concerns regarding medicines.  The following changes have been made:  no change  Labs/ tests ordered today include:   Orders Placed This Encounter  Procedures  . Lipid panel  . Hepatic function panel  . Basic metabolic panel  . HgB A1c     Check in 4 months for follow-up office visit EKG lipid panel hepatic function panel basal metabolic panel and hemoglobin A1c   Signed, Darlin Coco, MD  11/23/2014 5:21 PM    [facility] [place], [place], [place]  [address] Phone: [phone]; Fax: [phone]

## 2014-11-23 NOTE — Patient Instructions (Signed)
Your physician recommends that you continue on your current medications as directed. Please refer to the Current Medication list given to you today.  Your physician recommends that you return for a FASTING lipid,hepatic,bmet,hga1c in 4 months  Your physician wants you to follow-up in: 4 months with an EKG You will receive a reminder letter in the mail two months in advance. If you don't receive a letter, please call our office to schedule the follow-up appointment.

## 2014-11-26 ENCOUNTER — Other Ambulatory Visit: Payer: Self-pay | Admitting: Cardiology

## 2015-02-01 ENCOUNTER — Other Ambulatory Visit: Payer: Self-pay | Admitting: Cardiology

## 2015-02-07 ENCOUNTER — Other Ambulatory Visit: Payer: Self-pay

## 2015-03-09 ENCOUNTER — Other Ambulatory Visit: Payer: Self-pay

## 2015-03-09 MED ORDER — LEVOTHYROXINE SODIUM 125 MCG PO TABS: 125.0000 ug | ORAL_TABLET | Freq: Every day | ORAL | Status: DC

## 2015-03-22 ENCOUNTER — Other Ambulatory Visit (INDEPENDENT_AMBULATORY_CARE_PROVIDER_SITE_OTHER): Payer: Medicare Other

## 2015-03-22 ENCOUNTER — Other Ambulatory Visit: Payer: Medicare Other

## 2015-03-22 DIAGNOSIS — E785 Hyperlipidemia, unspecified: Secondary | ICD-10-CM

## 2015-03-22 DIAGNOSIS — I119 Hypertensive heart disease without heart failure: Secondary | ICD-10-CM | POA: Diagnosis not present

## 2015-03-22 DIAGNOSIS — R739 Hyperglycemia, unspecified: Secondary | ICD-10-CM

## 2015-03-22 DIAGNOSIS — R7309 Other abnormal glucose: Secondary | ICD-10-CM | POA: Diagnosis not present

## 2015-03-22 LAB — HEPATIC FUNCTION PANEL
ALBUMIN: 4.2 g/dL (ref 3.5–5.2)
ALT: 26 U/L (ref 0–35)
AST: 24 U/L (ref 0–37)
Alkaline Phosphatase: 45 U/L (ref 39–117)
Bilirubin, Direct: 0.1 mg/dL (ref 0.0–0.3)
Total Bilirubin: 0.6 mg/dL (ref 0.2–1.2)
Total Protein: 7 g/dL (ref 6.0–8.3)

## 2015-03-22 LAB — BASIC METABOLIC PANEL
BUN: 10 mg/dL (ref 6–23)
CHLORIDE: 104 meq/L (ref 96–112)
CO2: 27 mEq/L (ref 19–32)
Calcium: 9.4 mg/dL (ref 8.4–10.5)
Creatinine, Ser: 0.75 mg/dL (ref 0.40–1.20)
GFR: 81.94 mL/min (ref >60.00)
GLUCOSE: 115 mg/dL — AB (ref 70–99)
Potassium: 4 mEq/L (ref 3.5–5.1)
Sodium: 139 mEq/L (ref 135–145)

## 2015-03-22 LAB — LIPID PANEL
CHOLESTEROL: 114 mg/dL (ref 0–200)
HDL: 34.3 mg/dL — ABNORMAL LOW (ref >39.00)
LDL CALC: 61 mg/dL (ref 0–99)
NONHDL: 80.11
TRIGLYCERIDES: 95 mg/dL (ref 0.0–149.0)
Total CHOL/HDL Ratio: 3
VLDL: 19 mg/dL (ref 0.0–40.0)

## 2015-03-22 LAB — HEMOGLOBIN A1C: HEMOGLOBIN A1C: 6.8 % — AB (ref 4.6–6.5)

## 2015-03-22 NOTE — Progress Notes (Signed)
Quick Note:  Please make copy of labs for patient visit. ______ 

## 2015-03-25 ENCOUNTER — Ambulatory Visit (INDEPENDENT_AMBULATORY_CARE_PROVIDER_SITE_OTHER): Payer: Medicare Other | Admitting: Cardiology

## 2015-03-25 ENCOUNTER — Encounter: Payer: Self-pay | Admitting: Cardiology

## 2015-03-25 VITALS — BP 136/86 | HR 74 | Ht 67.0 in | Wt 164.0 lb

## 2015-03-25 DIAGNOSIS — R002 Palpitations: Secondary | ICD-10-CM

## 2015-03-25 DIAGNOSIS — R739 Hyperglycemia, unspecified: Secondary | ICD-10-CM

## 2015-03-25 DIAGNOSIS — E785 Hyperlipidemia, unspecified: Secondary | ICD-10-CM

## 2015-03-25 DIAGNOSIS — R7309 Other abnormal glucose: Secondary | ICD-10-CM

## 2015-03-25 DIAGNOSIS — I119 Hypertensive heart disease without heart failure: Secondary | ICD-10-CM

## 2015-03-25 NOTE — Progress Notes (Signed)
Cardiology Office Note   Date:  03/25/2015   ID:  Yvonne [name], Yvonne [name] 1946/03/19, MRN [id number]  PCP:  Tawanna Solo, MD  Cardiologist: Darlin Coco MD  No chief complaint on file.     History of Present Illness: Yvonne [name] is a 69 y.o. female who presents for scheduled four-month follow-up visit. This pleasant 69 year old woman is seen for a scheduled four-month followup office visit. She has a past history of essential hypertension and history of hypercholesterolemia and hypothyroidism. 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 Since last visit the patient has been doing well. She was seen as a work in on 12/28/11 because of chest pain and indigestion. She underwent a nuclear stress test which showed no evidence of ischemia and showed normal left ventricular systolic function. Her ejection fraction was 77%. She has a very strong history of vascular disease in her family. Multiple cousins have had strokes and heart attacks. Her own parents were both diabetic and had severe vascular disease. The patient herself has a history of high blood pressure and hyperlipidemia and obesity. She has had mild hyperglycemia in the past and her hemoglobin A1c has been running slightly elevated. Since last visit she has been making a good effort to get regular exercise and to remain on a heart healthy diet. The patient has a history of mild to moderate idiopathic peripheral neuropathy and is followed by Dr. Jannifer Franklin.  Since last visit she has been feeling better.  She has been more diligent about exercise and a careful diet.  Her blood work this time reflects that.  She has lost 3 pounds since last visit.  She has occasional indigestion.  She is not having any ischemic heart pain.  She has occasional ankle edema particularly when she goes to the beach.  She has arthritis of her knees and  has been getting shots by her orthopedist Dr. Wynelle Link.   Past Medical History  Diagnosis Date  . Hypertension   . Hyperlipidemia   . Hypothyroidism   . Heart palpitations   . LVH (left ventricular hypertrophy)     per echo in 2011  . History of diastolic dysfunction     per echo in 2011  . Anxiety   . Chest pressure   . Degenerative arthritis   . Sleep difficulties   . Normal nuclear stress test 2011  . Polyneuropathy in other diseases classified elsewhere 05/21/2013  . Diabetes mellitus without complication     diet controlled  . Tinnitus     Past Surgical History  Procedure Laterality Date  . Cardiovascular stress test  11/09/2009    EF 75%  . Transthoracic echocardiogram  11/01/2009    NORMAL LV FILLING AND DIASTOLIC DYSFUNCTION AND MILD AORTIC SCLEROSIS AND TRACE MITRAL REGURGITATION  . Appendectomy    . Carpal tunnel release      bilateral     Current Outpatient Prescriptions  Medication Sig Dispense Refill  . ALPRAZolam (XANAX) 0.25 MG tablet TAKE ONE TABLET BY MOUTH EVERY 8 HOURS AS NEEDED 90 tablet 3  . aspirin 81 MG tablet Take 81 mg by mouth daily.      . cholecalciferol (VITAMIN D) 1000 UNITS tablet Take 1,000 Units by mouth daily.    . fluticasone (FLONASE) 50 MCG/ACT nasal spray INSTILL TWO SPRAYS INTO EACH NOSTRIL ONCE A DAY 16 g 0  . FLUZONE  HIGH-DOSE injection Inject 0.05 mLs into the muscle.    . furosemide (LASIX) 20 MG tablet TAKE 1/2 TABLET BY MOUTH DAILY AS DIRECTED 45 tablet 2  . gabapentin (NEURONTIN) 300 MG capsule Take 300 mg by mouth at bedtime.    Marland Kitchen levothyroxine (SYNTHROID, LEVOTHROID) 125 MCG tablet Take 1 tablet (125 mcg total) by mouth daily. DAILY EXCEPT SUNDAYS 30 tablet 1  . metoprolol succinate (TOPROL-XL) 25 MG 24 hr tablet TAKE 1/2 TABLET BY MOUTH DAILY. 45 tablet 3  . metroNIDAZOLE (METROCREAM) 0.75 % cream Apply 1 application topically daily. (ROSACEA)    . omeprazole (PRILOSEC) 10 MG capsule Take 10 mg by mouth as needed.    .  potassium chloride SA (K-DUR,KLOR-CON) 20 MEQ tablet Take 0.5 tablets (10 mEq total) by mouth daily. 45 tablet 3  . Prenatal Vit-Fe Fumarate-FA (MULTIVITAMIN-PRENATAL) 27-0.8 MG TABS Take 1 tablet by mouth daily.    . rosuvastatin (CRESTOR) 5 MG tablet Take 1 tablet (5 mg total) by mouth daily. 30 tablet 5   No current facility-administered medications for this visit.    Allergies:   Tobramycin and Erythromycin    Social History:  The patient  reports that she quit smoking about 29 years ago. She has never used smokeless tobacco. She reports that she drinks alcohol. She reports that she does not use illicit drugs.   Family History:  The patient's family history includes Arthritis in her father; Diabetes in her father; Heart disease in her father and mother; Hypertension in her father and mother.    ROS:  Please see the history of present illness.   Otherwise, review of systems are positive for none.   All other systems are reviewed and negative.    PHYSICAL EXAM: VS:  BP 136/86 mmHg  Pulse 74  Ht 5\' 7"  (1.702 m)  Wt 164 lb (74.39 kg)  BMI 25.68 kg/m2 , BMI Body mass index is 25.68 kg/(m^2). GEN: Well nourished, well developed, in no acute distress HEENT: normal Neck: no JVD, carotid bruits, or masses Cardiac: RRR; no murmurs, rubs, or gallops,no edema  Respiratory:  clear to auscultation bilaterally, normal work of breathing GI: soft, nontender, nondistended, + BS MS: no deformity or atrophy Skin: warm and dry, no rash Neuro:  Strength and sensation are intact Psych: euthymic mood, full affect   EKG:  EKG is ordered today. The ekg ordered today demonstrates normal sinus rhythm.  No ischemic changes.  No change since previous tracing of 07/15/14   Recent Labs: 11/12/2014: TSH 0.74 03/22/2015: ALT 26; BUN 10; Creatinine, Ser 0.75; Potassium 4.0; Sodium 139    Lipid Panel    Component Value Date/Time   CHOL 114 03/22/2015 0733   TRIG 95.0 03/22/2015 0733   HDL 34.30*  03/22/2015 0733   CHOLHDL 3 03/22/2015 0733   VLDL 19.0 03/22/2015 0733   LDLCALC 61 03/22/2015 0733      Wt Readings from Last 3 Encounters:  03/25/15 164 lb (74.39 kg)  11/23/14 167 lb (75.751 kg)  07/15/14 168 lb (76.204 kg)        ASSESSMENT AND PLAN:  1. Benign hypertensive heart disease without heart failure 2. Hyperlipidemia 3. hyperglycemia with peripheral neuropathy 4. atypical chest pain. 5. Hypothyroidism 6. Situational stress related to her nephew's illness.  His situation has stabilized somewhat since we last saw her.  He has a neuroendocrine carcinoma with metastases  Plan: Continue same medication. recheck in 4 months for office visit lipid panel hepatic function panel basal metabolic panel  and A1c level.    Current medicines are reviewed at length with the patient today.  The patient does not have concerns regarding medicines.  The following changes have been made:  no change  Labs/ tests ordered today include:   Orders Placed This Encounter  Procedures  . Lipid panel  . Hepatic function panel  . Basic metabolic panel  . Hemoglobin A1c  . EKG 12-Lead      Signed, Darlin Coco MD 03/25/2015 5:35 PM    [facility] [place], [place], [place]  [address] Phone: [phone]; Fax: [phone]

## 2015-03-25 NOTE — Patient Instructions (Signed)
Medication Instructions:  Your physician recommends that you continue on your current medications as directed. Please refer to the Current Medication list given to you today.  Labwork: none  Testing/Procedures: none  Follow-Up: Your physician recommends that you schedule a follow-up appointment in: 4 months with fasting labs (lp/bmet/hfp/a1c)

## 2015-05-21 ENCOUNTER — Other Ambulatory Visit: Payer: Self-pay | Admitting: Cardiology

## 2015-05-23 ENCOUNTER — Telehealth: Payer: Self-pay | Admitting: Cardiology

## 2015-05-23 NOTE — Telephone Encounter (Signed)
Spoke with pharmacy and refill received

## 2015-05-23 NOTE — Telephone Encounter (Signed)
Koppel calling stating that pt needs a refill sent to [facility] for Synthroid, pt has no medication for tomorrow and just needs enough to last her until her mail order prescription comes in. Please call back and advise.

## 2015-05-27 ENCOUNTER — Telehealth: Payer: Self-pay | Admitting: Cardiology

## 2015-05-27 NOTE — Telephone Encounter (Signed)
New Message    Pt wants a referral for a diagnostic mammogram  Refer to [facility]

## 2015-05-27 NOTE — Telephone Encounter (Signed)
Dr. Mare Ferrari signed order and put in medical records to fax

## 2015-05-31 ENCOUNTER — Telehealth: Payer: Self-pay | Admitting: Cardiology

## 2015-05-31 NOTE — Telephone Encounter (Signed)
New Message       Pt calling stating that she has some questions for [facility], pt will not elaborate. Please call back and advise.

## 2015-05-31 NOTE — Telephone Encounter (Signed)
Spoke with patient and she received a dismissal letter from Dr [employer] office for being noncompliant  Very upset about letter since we have been faxing labs to his office and has missed no appointments there. When she has called in the past they have just told her labs all that were needed  Advised patient to call and speak with Dr Baldwin Crown nurse to see if this could be resolved

## 2015-06-17 ENCOUNTER — Telehealth: Payer: Self-pay | Admitting: Cardiology

## 2015-06-17 NOTE — Telephone Encounter (Signed)
Spoke with patient, wanted to update that she is still being dismissed from Dr Forde Dandy secondary to not being seen Patient is going to call back next week and ask to speak with Environmental education officer

## 2015-06-17 NOTE — Telephone Encounter (Signed)
New Message  Please call pt about Dr.SOUTH

## 2015-06-29 ENCOUNTER — Encounter: Payer: Self-pay | Admitting: Cardiology

## 2015-07-03 ENCOUNTER — Encounter: Payer: Self-pay | Admitting: Cardiology

## 2015-07-04 ENCOUNTER — Other Ambulatory Visit: Payer: Self-pay | Admitting: *Deleted

## 2015-07-04 MED ORDER — ROSUVASTATIN CALCIUM 5 MG PO TABS: 5.0000 mg | ORAL_TABLET | Freq: Every day | ORAL | Status: DC

## 2015-07-18 ENCOUNTER — Other Ambulatory Visit: Payer: Medicare Other

## 2015-07-21 ENCOUNTER — Ambulatory Visit: Payer: Medicare Other | Admitting: Cardiology

## 2015-07-22 ENCOUNTER — Ambulatory Visit: Payer: Medicare Other | Admitting: Cardiology

## 2015-07-28 ENCOUNTER — Other Ambulatory Visit: Payer: Self-pay | Admitting: Cardiology

## 2015-07-28 DIAGNOSIS — F419 Anxiety disorder, unspecified: Secondary | ICD-10-CM

## 2015-07-28 DIAGNOSIS — E038 Other specified hypothyroidism: Secondary | ICD-10-CM

## 2015-07-28 NOTE — Telephone Encounter (Signed)
Please advise on refill as patients office visit has levothyroxine listed and was not sure if this should be sent in for synthroid as requested. Thanks, MI

## 2015-07-29 ENCOUNTER — Other Ambulatory Visit: Payer: Self-pay | Admitting: Cardiology

## 2015-07-29 ENCOUNTER — Telehealth: Payer: Self-pay | Admitting: *Deleted

## 2015-07-29 MED ORDER — ALPRAZOLAM 0.25 MG PO TABS
ORAL_TABLET | ORAL | Status: DC
Start: 2015-07-29 — End: 2015-09-12

## 2015-07-29 NOTE — Telephone Encounter (Signed)
called to inform pt that melinda sent the script in by phone. Yvonne [name] phoned in the script 07/28/15. Dr. Mare Ferrari signed it 07/29/15. Yvonne [name] will call again to [facility]. Called Yvonne [name] to let her know melinda would call [facility] again. LVM to that extent.

## 2015-08-09 ENCOUNTER — Telehealth: Payer: Self-pay | Admitting: Cardiology

## 2015-08-09 DIAGNOSIS — Z79899 Other long term (current) drug therapy: Secondary | ICD-10-CM

## 2015-08-09 DIAGNOSIS — E039 Hypothyroidism, unspecified: Secondary | ICD-10-CM

## 2015-08-09 NOTE — Telephone Encounter (Signed)
Added thyroid and vitamin D orders as requested Patient will be getting at the [facility] lab on [address] [place]

## 2015-08-09 NOTE — Telephone Encounter (Signed)
New problem   Pt need to speak to your concerning having labs drawn for Vitamin D and thyroid. Please advise.

## 2015-09-06 ENCOUNTER — Other Ambulatory Visit (INDEPENDENT_AMBULATORY_CARE_PROVIDER_SITE_OTHER): Payer: Medicare Other | Admitting: *Deleted

## 2015-09-06 DIAGNOSIS — E038 Other specified hypothyroidism: Secondary | ICD-10-CM | POA: Diagnosis not present

## 2015-09-06 LAB — HEPATIC FUNCTION PANEL
ALT: 24 U/L (ref 6–29)
AST: 24 U/L (ref 10–35)
Albumin: 4.4 g/dL (ref 3.6–5.1)
Alkaline Phosphatase: 44 U/L (ref 33–130)
BILIRUBIN DIRECT: 0.1 mg/dL (ref <=0.2)
BILIRUBIN TOTAL: 0.6 mg/dL (ref 0.2–1.2)
Indirect Bilirubin: 0.5 mg/dL (ref 0.2–1.2)
Total Protein: 7.1 g/dL (ref 6.1–8.1)

## 2015-09-06 LAB — BASIC METABOLIC PANEL
BUN: 13 mg/dL (ref 7–25)
CHLORIDE: 103 mmol/L (ref 98–110)
CO2: 23 mmol/L (ref 20–31)
CREATININE: 0.66 mg/dL (ref 0.50–0.99)
Calcium: 9.5 mg/dL (ref 8.6–10.4)
GLUCOSE: 120 mg/dL — AB (ref 65–99)
POTASSIUM: 4.3 mmol/L (ref 3.5–5.3)
Sodium: 137 mmol/L (ref 135–146)

## 2015-09-06 LAB — T4, FREE: Free T4: 1.54 ng/dL (ref 0.80–1.80)

## 2015-09-06 LAB — TSH: TSH: 0.516 u[IU]/mL (ref 0.350–4.500)

## 2015-09-06 LAB — LIPID PANEL
Cholesterol: 129 mg/dL (ref 125–200)
HDL: 36 mg/dL — ABNORMAL LOW (ref >=46)
LDL CALC: 74 mg/dL (ref <130)
Total CHOL/HDL Ratio: 3.6 Ratio (ref <=5.0)
Triglycerides: 93 mg/dL (ref <150)
VLDL: 19 mg/dL (ref <30)

## 2015-09-06 LAB — T3: T3, Total: 121.9 ng/dL (ref 80.0–204.0)

## 2015-09-06 NOTE — Addendum Note (Signed)
Addended by: Domenica Reamer R on: 09/06/2015 08:09 AM   Modules accepted: Orders

## 2015-09-06 NOTE — Progress Notes (Signed)
Quick Note:  Please make copy of labs for patient visit. ______ 

## 2015-09-07 LAB — VITAMIN D 25 HYDROXY (VIT D DEFICIENCY, FRACTURES): Vit D, 25-Hydroxy: 43 ng/mL (ref 30–100)

## 2015-09-07 LAB — HEMOGLOBIN A1C
HEMOGLOBIN A1C: 7.1 % — AB (ref <5.7)
Mean Plasma Glucose: 157 mg/dL — ABNORMAL HIGH (ref <117)

## 2015-09-07 NOTE — Progress Notes (Signed)
Quick Note:  Please make copy of labs for patient visit. ______ 

## 2015-09-12 ENCOUNTER — Encounter: Payer: Self-pay | Admitting: Cardiology

## 2015-09-12 ENCOUNTER — Ambulatory Visit (INDEPENDENT_AMBULATORY_CARE_PROVIDER_SITE_OTHER): Payer: Medicare Other | Admitting: Cardiology

## 2015-09-12 VITALS — BP 134/80 | HR 79 | Ht 66.25 in | Wt 165.0 lb

## 2015-09-12 DIAGNOSIS — E039 Hypothyroidism, unspecified: Secondary | ICD-10-CM

## 2015-09-12 DIAGNOSIS — E038 Other specified hypothyroidism: Secondary | ICD-10-CM

## 2015-09-12 DIAGNOSIS — I119 Hypertensive heart disease without heart failure: Secondary | ICD-10-CM | POA: Diagnosis not present

## 2015-09-12 DIAGNOSIS — E785 Hyperlipidemia, unspecified: Secondary | ICD-10-CM

## 2015-09-12 DIAGNOSIS — R002 Palpitations: Secondary | ICD-10-CM

## 2015-09-12 MED ORDER — LEVOTHYROXINE SODIUM 125 MCG PO TABS: ORAL_TABLET | ORAL | Status: DC

## 2015-09-12 NOTE — Progress Notes (Signed)
Cardiology Office Note   Date:  09/12/2015   ID:  Aderinsola, Dolezal 1946-02-02, MRN [id number]  PCP:  Tawanna Solo, MD  Cardiologist: Darlin Coco MD  Chief Complaint  Patient presents with  . scheduled follow up      History of Present Illness: Yvonne [name] is a 70 y.o. female who presents for a four-month follow-up visit.  She has a past history of essential hypertension and history of hypercholesterolemia and hypothyroidism. 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 Since last visit the patient has been doing well. She was seen as a work in on 12/28/11 because of chest pain and indigestion. She underwent a nuclear stress test which showed no evidence of ischemia and showed normal left ventricular systolic function. Her ejection fraction was 77%. She has a very strong history of vascular disease in her family. Multiple cousins have had strokes and heart attacks. Her own parents were both diabetic and had severe vascular disease. The patient herself has a history of high blood pressure and hyperlipidemia and obesity. She has had mild hyperglycemia in the past and her hemoglobin A1c has been running slightly elevated. Since last visit she has been making a good effort to get regular exercise and to remain on a heart healthy diet. The patient has a history of mild to moderate idiopathic peripheral neuropathy and is followed by Dr. Jannifer Franklin.  Since last visit she has been feeling better. She has been more diligent about exercise and a careful diet. Her blood work this time reflects that. She has lost 3 pounds since last visit. She has occasional indigestion. She is not having any ischemic heart pain. She has occasional ankle edema particularly when she goes to the beach. She has arthritis of her knees and has been getting shots by her orthopedist Dr. Wynelle Link. She  has a history of a post thyroidism and is on Synthroid.  We checked her thyroid levels this time and they are in the therapeutic range. The patient is making an effort to lose weight.  She would like to lose another 10 pounds.  I encouraged her to walk regularly for exercise.  Past Medical History  Diagnosis Date  . Hypertension   . Hyperlipidemia   . Hypothyroidism   . Heart palpitations   . LVH (left ventricular hypertrophy)     per echo in 2011  . History of diastolic dysfunction     per echo in 2011  . Anxiety   . Chest pressure   . Degenerative arthritis   . Sleep difficulties   . Normal nuclear stress test 2011  . Polyneuropathy in other diseases classified elsewhere ([name]) 05/21/2013  . Diabetes mellitus without complication (HCC)     diet controlled  . Tinnitus     Past Surgical History  Procedure Laterality Date  . Cardiovascular stress test  11/09/2009    EF 75%  . Transthoracic echocardiogram  11/01/2009    NORMAL LV FILLING AND DIASTOLIC DYSFUNCTION AND MILD AORTIC SCLEROSIS AND TRACE MITRAL REGURGITATION  . Appendectomy    . Carpal tunnel release      bilateral     Current Outpatient Prescriptions  Medication Sig Dispense Refill  . ALPRAZolam (XANAX) 0.25 MG tablet Take 0.25 mg by mouth 3 (three) times daily as needed for anxiety.    Marland Kitchen aspirin 81 MG tablet Take 81 mg by  mouth daily.      . cholecalciferol (VITAMIN D) 1000 UNITS tablet Take 1,000 Units by mouth daily.    . fluticasone (FLONASE) 50 MCG/ACT nasal spray INSTILL TWO SPRAYS INTO EACH NOSTRIL ONCE A DAY 16 g 0  . furosemide (LASIX) 20 MG tablet Take 10 mg by mouth daily.    Marland Kitchen gabapentin (NEURONTIN) 300 MG capsule Take 300 mg by mouth at bedtime.    . metoprolol succinate (TOPROL-XL) 25 MG 24 hr tablet TAKE 1/2 TABLET BY MOUTH DAILY. 45 tablet 3  . metroNIDAZOLE (METROCREAM) 0.75 % cream Apply 1 application topically daily. (ROSACEA)    . omeprazole (PRILOSEC) 10 MG capsule Take 10 mg by mouth as needed  (indigestion).     . potassium chloride SA (K-DUR,KLOR-CON) 20 MEQ tablet Take 0.5 tablets (10 mEq total) by mouth daily. 45 tablet 3  . Prenatal Vit-Fe Fumarate-FA (MULTIVITAMIN-PRENATAL) 27-0.8 MG TABS Take 1 tablet by mouth daily.    . rosuvastatin (CRESTOR) 5 MG tablet Take 1 tablet (5 mg total) by mouth daily. 90 tablet 3  . SYNTHROID 125 MCG tablet TAKE 1 TABLET BY MOUTH DAILY EXCEPT SUNDAYS 30 tablet 2   No current facility-administered medications for this visit.    Allergies:   Tobramycin and Erythromycin    Social History:  The patient  reports that she quit smoking about 29 years ago. She has never used smokeless tobacco. She reports that she drinks alcohol. She reports that she does not use illicit drugs.   Family History:  The patient's family history includes Arthritis in her father; Diabetes in her father; Heart disease in her father and mother; Hypertension in her father and mother.    ROS:  Please see the history of present illness.   Otherwise, review of systems are positive for none.   All other systems are reviewed and negative.    PHYSICAL EXAM: VS:  BP 134/80 mmHg  Pulse 79  Ht 5' 6.25" (1.683 m)  Wt 165 lb (74.844 kg)  BMI 26.42 kg/m2 , BMI Body mass index is 26.42 kg/(m^2). GEN: Well nourished, well developed, in no acute distress HEENT: normal Neck: no JVD, carotid bruits, or masses Cardiac: RRR; no murmurs, rubs, or gallops,no edema  Respiratory:  clear to auscultation bilaterally, normal work of breathing GI: soft, nontender, nondistended, + BS MS: no deformity or atrophy Skin: warm and dry, no rash Neuro:  Strength and sensation are intact Psych: euthymic mood, full affect   EKG:  EKG is not ordered today.    Recent Labs: 09/06/2015: ALT 24; BUN 13; Creat 0.66; Potassium 4.3; Sodium 137; TSH 0.516    Lipid Panel    Component Value Date/Time   CHOL 129 09/06/2015 0809   TRIG 93 09/06/2015 0809   HDL 36* 09/06/2015 0809   CHOLHDL 3.6  09/06/2015 0809   VLDL 19 09/06/2015 0809   LDLCALC 74 09/06/2015 0809      Wt Readings from Last 3 Encounters:  09/12/15 165 lb (74.844 kg)  03/25/15 164 lb (74.39 kg)  11/23/14 167 lb (75.751 kg)        ASSESSMENT AND PLAN:  1. Benign hypertensive heart disease without heart failure 2. Hyperlipidemia 3. hyperglycemia with peripheral neuropathy 4. atypical chest pain. 5. Hypothyroidism 6. Situational stress related to her nephew's illness. His situation is still quite tenuous.  He has a neuroendocrine carcinoma with metastases to the liver and is being treated at a teaching hospital in [place].  Current medicines are reviewed at length  with the patient today.  The patient does not have concerns regarding medicines.  The following changes have been made:  no change  Labs/ tests ordered today include:  No orders of the defined types were placed in this encounter.    Disposition: I concurred with her goal to try to lose another 10 pounds.  Continue current medication.  Recheck in 4 months with Dr. Debara Pickett for office visit and EKG and fasting lab work including lipid panel hepatic function panel and basal metabolic panel  Signed, Darlin Coco MD 09/12/2015 1:11 PM    [address], [place], [place]  [address] Phone: [phone]; Fax: [phone]

## 2015-09-12 NOTE — Patient Instructions (Signed)
Medication Instructions:  Your physician recommends that you continue on your current medications as directed. Please refer to the Current Medication list given to you today.  Labwork: NONE  Testing/Procedures: NONE  Follow-Up: Your physician recommends that you schedule a follow-up appointment in: 4 months with fasting labs (lp/bmet/hfp) WITH DR HILTY  Any Other Special Instructions Will Be Listed Below (If Applicable). WORK ON WEIGHT LOSS   If you need a refill on your cardiac medications before your next appointment, please call your pharmacy.

## 2015-09-12 NOTE — Addendum Note (Signed)
Addended by: Alvina Filbert B on: 09/12/2015 01:43 PM   Modules accepted: Orders

## 2015-09-30 ENCOUNTER — Other Ambulatory Visit: Payer: Self-pay | Admitting: Cardiology

## 2015-10-03 NOTE — Telephone Encounter (Signed)
Spoke with patient and she stated she was told by  Dr. Mare Ferrari ok to take Gabapentin 1 to 2 tablets at bedtime Discussed with  Dr. Mare Ferrari and ok to fill Additional refills from PCP, advised patient

## 2015-11-25 ENCOUNTER — Encounter: Payer: Self-pay | Admitting: Neurology

## 2015-11-25 ENCOUNTER — Ambulatory Visit (INDEPENDENT_AMBULATORY_CARE_PROVIDER_SITE_OTHER): Payer: Medicare Other | Admitting: Neurology

## 2015-11-25 VITALS — BP 123/78 | HR 68 | Ht 66.25 in | Wt 168.0 lb

## 2015-11-25 DIAGNOSIS — G63 Polyneuropathy in diseases classified elsewhere: Secondary | ICD-10-CM | POA: Diagnosis not present

## 2015-11-25 MED ORDER — GABAPENTIN 100 MG PO CAPS: 100.0000 mg | ORAL_CAPSULE | Freq: Three times a day (TID) | ORAL | Status: DC

## 2015-11-25 NOTE — Progress Notes (Signed)
3    Reason for visit: Peripheral neuropathy  Yvonne [name] is an 70 y.o. female  History of present illness:  Yvonne [name] is a 71 year old right-handed white female with a history of a peripheral neuropathy associated with diabetes. The patient has mainly diet-controlled diabetes, but her hemoglobin A1c has gone up to 7.1. The patient has features of a small fiber neuropathy with temperature sensation alteration, and some discomfort. The patient reports that her feet feel are cold much of the day, this bothers her at night. The patient has to wear socks at all times. She has itching sensations in the low part of the back at times, and she has a disc bulge in her neck with some neck discomfort. The patient is on gabapentin taking 300 mg night, she never went up to the 600 mg dose. Most of her discomfort is in the nighttime. The patient gets about 4 hours of good sleep with the gabapentin. She has had some swelling in ankles, she reports some weight gain. She denies any significant issues with balance, she has not had any falls. She comes to this office for an evaluation.  Past Medical History  Diagnosis Date  . Hypertension   . Hyperlipidemia   . Hypothyroidism   . Heart palpitations   . LVH (left ventricular hypertrophy)     per echo in 2011  . History of diastolic dysfunction     per echo in 2011  . Anxiety   . Chest pressure   . Degenerative arthritis   . Sleep difficulties   . Normal nuclear stress test 2011  . Polyneuropathy in other diseases classified elsewhere ([place]) 05/21/2013  . Diabetes mellitus without complication (HCC)     diet controlled  . Tinnitus     Past Surgical History  Procedure Laterality Date  . Cardiovascular stress test  11/09/2009    EF 75%  . Transthoracic echocardiogram  11/01/2009    NORMAL LV FILLING AND DIASTOLIC DYSFUNCTION AND MILD AORTIC SCLEROSIS AND TRACE MITRAL REGURGITATION  . Appendectomy    . Carpal tunnel release      bilateral     Family History  Problem Relation Age of Onset  . Hypertension Mother   . Heart disease Mother   . Hypertension Father   . Heart disease Father   . Arthritis Father   . Diabetes Father     Social history:  reports that she quit smoking about 29 years ago. She has never used smokeless tobacco. She reports that she drinks alcohol. She reports that she does not use illicit drugs.    Allergies  Allergen Reactions  . Tobramycin Hives  . Erythromycin Nausea Only    Medications:  Prior to Admission medications   Medication Sig Start Date End Date Taking? Authorizing Provider  ALPRAZolam (XANAX) 0.25 MG tablet Take 0.25 mg by mouth 3 (three) times daily as needed for anxiety.    Historical Provider, MD  aspirin 81 MG tablet Take 81 mg by mouth daily.      Historical Provider, MD  cholecalciferol (VITAMIN D) 1000 UNITS tablet Take 1,000 Units by mouth daily.    Historical Provider, MD  fluticasone (FLONASE) 50 MCG/ACT nasal spray INSTILL TWO SPRAYS INTO EACH NOSTRIL ONCE A DAY 03/04/12   Mancel Bale, PA-C  furosemide (LASIX) 20 MG tablet Take 10 mg by mouth daily.    Historical Provider, MD  gabapentin (NEURONTIN) 300 MG capsule 1 TO 2 TABLETS BY MOUTH AT BEDTIME 10/03/15  Darlin Coco, MD  levothyroxine (SYNTHROID) 125 MCG tablet TAKE 1 TABLET BY MOUTH DAILY EXCEPT SUNDAYS 09/12/15   Darlin Coco, MD  metoprolol succinate (TOPROL-XL) 25 MG 24 hr tablet TAKE 1/2 TABLET BY MOUTH DAILY. 11/26/14   Darlin Coco, MD  metroNIDAZOLE (METROCREAM) 0.75 % cream Apply 1 application topically daily. (ROSACEA) 12/21/14   Historical Provider, MD  omeprazole (PRILOSEC) 10 MG capsule Take 10 mg by mouth as needed (indigestion).     Historical Provider, MD  potassium chloride SA (K-DUR,KLOR-CON) 20 MEQ tablet TAKE 1/2 TABLET BY MOUTH DAILY. 09/30/15   Darlin Coco, MD  Prenatal Vit-Fe Fumarate-FA (MULTIVITAMIN-PRENATAL) 27-0.8 MG TABS Take 1 tablet by mouth daily.    Historical Provider, MD   rosuvastatin (CRESTOR) 5 MG tablet Take 1 tablet (5 mg total) by mouth daily. 07/04/15   Darlin Coco, MD    ROS:  Out of a complete 14 system review of symptoms, the patient complains only of the following symptoms, and all other reviewed systems are negative.  Cold sensations in the feet Neck discomfort  Blood pressure 123/78, pulse 68, height 5' 6.25" (1.683 m), weight 168 lb (76.204 kg).  Physical Exam  General: The patient is alert and cooperative at the time of the examination.  Skin: No significant peripheral edema is noted.   Neurologic Exam  Mental status: The patient is alert and oriented x 3 at the time of the examination. The patient has apparent normal recent and remote memory, with an apparently normal attention span and concentration ability.   Cranial nerves: Facial symmetry is present. Speech is normal, no aphasia or dysarthria is noted. Extraocular movements are full. Visual fields are full.  Motor: The patient has good strength in all 4 extremities.  Sensory examination: Soft touch sensation is symmetric on the face, arms, and legs.  Coordination: The patient has good finger-nose-finger and heel-to-shin bilaterally.  Gait and station: The patient has a normal gait. Tandem gait is normal. Romberg is negative. No drift is seen.  Reflexes: Deep tendon reflexes are symmetric, but are depressed.   Assessment/Plan:  1. Diabetes  2. Diabetic peripheral neuropathy  The patient is having some discomfort in the day and nighttime. The patient will be given a 100 mg prescription for the gabapentin to take during the day, and add to the 300 mg dosing at night to determine what dose is the best for her. The patient will contact me if she needs new prescriptions. Otherwise, she will follow-up in one year. She is doing well balance issues. Most of her abnormal sensations are in the feet, not up the leg.   Jill Alexanders MD 11/27/2015 8:30 PM  [facility] [address] [place], [place] [address]  Phone [phone] Fax [phone]

## 2016-01-04 ENCOUNTER — Other Ambulatory Visit (INDEPENDENT_AMBULATORY_CARE_PROVIDER_SITE_OTHER): Payer: Medicare Other | Admitting: *Deleted

## 2016-01-04 DIAGNOSIS — E785 Hyperlipidemia, unspecified: Secondary | ICD-10-CM | POA: Diagnosis not present

## 2016-01-04 DIAGNOSIS — Z8639 Personal history of other endocrine, nutritional and metabolic disease: Secondary | ICD-10-CM

## 2016-01-04 DIAGNOSIS — R002 Palpitations: Secondary | ICD-10-CM | POA: Diagnosis not present

## 2016-01-04 DIAGNOSIS — E02 Subclinical iodine-deficiency hypothyroidism: Secondary | ICD-10-CM

## 2016-01-04 DIAGNOSIS — I119 Hypertensive heart disease without heart failure: Secondary | ICD-10-CM

## 2016-01-04 LAB — HEMOGLOBIN A1C
Hgb A1c MFr Bld: 7.4 % — ABNORMAL HIGH (ref <5.7)
Mean Plasma Glucose: 166 mg/dL

## 2016-01-04 LAB — LIPID PANEL
CHOL/HDL RATIO: 3.4 ratio (ref <=5.0)
CHOLESTEROL: 134 mg/dL (ref 125–200)
HDL: 39 mg/dL — ABNORMAL LOW (ref >=46)
LDL Cholesterol: 77 mg/dL (ref <130)
Triglycerides: 91 mg/dL (ref <150)
VLDL: 18 mg/dL (ref <30)

## 2016-01-04 LAB — BASIC METABOLIC PANEL
BUN: 12 mg/dL (ref 7–25)
CALCIUM: 9.1 mg/dL (ref 8.6–10.4)
CO2: 27 mmol/L (ref 20–31)
CREATININE: 0.71 mg/dL (ref 0.50–0.99)
Chloride: 103 mmol/L (ref 98–110)
GLUCOSE: 136 mg/dL — AB (ref 65–99)
Potassium: 4.1 mmol/L (ref 3.5–5.3)
SODIUM: 138 mmol/L (ref 135–146)

## 2016-01-04 LAB — TSH: TSH: 0.72 m[IU]/L

## 2016-01-04 LAB — HEPATIC FUNCTION PANEL
ALK PHOS: 47 U/L (ref 33–130)
ALT: 26 U/L (ref 6–29)
AST: 23 U/L (ref 10–35)
Albumin: 4.2 g/dL (ref 3.6–5.1)
BILIRUBIN DIRECT: 0.1 mg/dL (ref <=0.2)
BILIRUBIN INDIRECT: 0.6 mg/dL (ref 0.2–1.2)
Total Bilirubin: 0.7 mg/dL (ref 0.2–1.2)
Total Protein: 6.7 g/dL (ref 6.1–8.1)

## 2016-01-04 LAB — T3, FREE: T3 FREE: 3.3 pg/mL (ref 2.3–4.2)

## 2016-01-04 LAB — T4, FREE: Free T4: 1.6 ng/dL (ref 0.8–1.8)

## 2016-01-04 NOTE — Addendum Note (Signed)
Addended by: Eulis Foster on: 01/04/2016 07:39 AM   Modules accepted: Orders

## 2016-01-05 ENCOUNTER — Telehealth: Payer: Self-pay | Admitting: Diagnostic Radiology

## 2016-01-05 ENCOUNTER — Telehealth: Payer: Self-pay | Admitting: Internal Medicine

## 2016-01-05 LAB — VITAMIN D 25 HYDROXY (VIT D DEFICIENCY, FRACTURES): Vit D, 25-Hydroxy: 38 ng/mL (ref 30–100)

## 2016-01-05 NOTE — Telephone Encounter (Signed)
Pt coming in to see Dr Debara Pickett Tuesday,wants you to know she had lab work yesterday at [facility].

## 2016-01-05 NOTE — Telephone Encounter (Signed)
Wrong provider

## 2016-01-10 ENCOUNTER — Encounter: Payer: Self-pay | Admitting: Internal Medicine

## 2016-01-10 ENCOUNTER — Ambulatory Visit (INDEPENDENT_AMBULATORY_CARE_PROVIDER_SITE_OTHER): Payer: Medicare Other | Admitting: Internal Medicine

## 2016-01-10 VITALS — BP 146/82 | HR 78 | Ht 67.0 in | Wt 165.0 lb

## 2016-01-10 DIAGNOSIS — I119 Hypertensive heart disease without heart failure: Secondary | ICD-10-CM | POA: Diagnosis not present

## 2016-01-10 DIAGNOSIS — R002 Palpitations: Secondary | ICD-10-CM | POA: Diagnosis not present

## 2016-01-10 DIAGNOSIS — E119 Type 2 diabetes mellitus without complications: Secondary | ICD-10-CM | POA: Insufficient documentation

## 2016-01-10 DIAGNOSIS — E785 Hyperlipidemia, unspecified: Secondary | ICD-10-CM

## 2016-01-10 DIAGNOSIS — E1140 Type 2 diabetes mellitus with diabetic neuropathy, unspecified: Secondary | ICD-10-CM

## 2016-01-10 NOTE — Patient Instructions (Signed)
Your physician wants you to follow-up in: 6 months with Dr. Debara Pickett. You will receive a reminder letter in the mail two months in advance. If you don't receive a letter, please call our office to schedule the follow-up appointment.  Dr. Debara Pickett recommends that you purchase compression stockings - knee high - 20-16mmHg

## 2016-01-10 NOTE — Progress Notes (Signed)
Cardiology Office Note   Date:  01/10/2016   ID:  Yvonne [name], Yvonne [name] March 17, 1946, MRN [id number]  PCP:  Tawanna Solo, MD   Chief Complaint  Patient presents with  . Follow Up 4 month visit - used to see Dr. Mare Ferrari    labs in [facility]; stress from moving this past weekend, swelling in feet     History of Present Illness: Yvonne [name] is a 70 y.o. female who presents for a four-month follow-up visit.  She has a past history of essential hypertension and history of hypercholesterolemia and hypothyroidism. 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 Since last visit the patient has been doing well. She was seen as a work in on 12/28/11 because of chest pain and indigestion. She underwent a nuclear stress test which showed no evidence of ischemia and showed normal left ventricular systolic function. Her ejection fraction was 77%. She has a very strong history of vascular disease in her family. Multiple cousins have had strokes and heart attacks. Her own parents were both diabetic and had severe vascular disease. The patient herself has a history of high blood pressure and hyperlipidemia and obesity. She has had mild hyperglycemia in the past and her hemoglobin A1c has been running slightly elevated.   01/10/2016  I had the pleasure seeing Yvonne [name] today in the office. She was a former patient of Dr. Warren Danes. She has a history of dyslipidemia, neuropathy and hypothyroidism. He is also treated her in the past for low vitamin D. We recently repeated laboratory work which shows generally good control over things except for her hemoglobin A1c which is now 7.4. She says she's not been diagnosed with diabetes however this meets diagnostic criteria for that. She is not on any medicines for lowering blood sugar. There is a family history of heart disease but she has no known  cardiac events.  Past Medical History  Diagnosis Date  . Hypertension   . Hyperlipidemia   . Hypothyroidism   . Heart palpitations   . LVH (left ventricular hypertrophy)     per echo in 2011  . History of diastolic dysfunction     per echo in 2011  . Anxiety   . Chest pressure   . Degenerative arthritis   . Sleep difficulties   . Normal nuclear stress test 2011  . Polyneuropathy in other diseases classified elsewhere ([place]) 05/21/2013  . Diabetes mellitus without complication (HCC)     diet controlled  . Tinnitus     Past Surgical History  Procedure Laterality Date  . Cardiovascular stress test  11/09/2009    EF 75%  . Transthoracic echocardiogram  11/01/2009    NORMAL LV FILLING AND DIASTOLIC DYSFUNCTION AND MILD AORTIC SCLEROSIS AND TRACE MITRAL REGURGITATION  . Appendectomy    . Carpal tunnel release      bilateral     Current Outpatient Prescriptions  Medication Sig Dispense Refill  . ALPRAZolam (XANAX) 0.25 MG tablet Take 0.25 mg by mouth 3 (three) times daily as needed for anxiety.    Marland Kitchen aspirin 81 MG tablet Take 81 mg by mouth daily.      . cholecalciferol (VITAMIN D) 1000 UNITS tablet Take 1,000 Units by mouth daily.    . fluticasone (FLONASE) 50 MCG/ACT nasal spray INSTILL TWO SPRAYS INTO EACH NOSTRIL ONCE A DAY 16 g 0  . furosemide (  LASIX) 20 MG tablet Take 10 mg by mouth daily.    Marland Kitchen gabapentin (NEURONTIN) 100 MG capsule Take 100 mg by mouth 3 (three) times daily as needed.    . gabapentin (NEURONTIN) 300 MG capsule 1 TO 2 TABLETS BY MOUTH AT BEDTIME 180 capsule 0  . levothyroxine (SYNTHROID) 125 MCG tablet TAKE 1 TABLET BY MOUTH DAILY EXCEPT SUNDAYS 30 tablet 0  . metoprolol succinate (TOPROL-XL) 25 MG 24 hr tablet TAKE 1/2 TABLET BY MOUTH DAILY. 45 tablet 3  . metroNIDAZOLE (METROCREAM) 0.75 % cream Apply 1 application topically daily. (ROSACEA)    . omeprazole (PRILOSEC) 10 MG capsule Take 10 mg by mouth as needed (indigestion).     . potassium chloride SA  (K-DUR,KLOR-CON) 20 MEQ tablet TAKE 1/2 TABLET BY MOUTH DAILY. 45 tablet 3  . Prenatal Vit-Fe Fumarate-FA (MULTIVITAMIN-PRENATAL) 27-0.8 MG TABS Take 1 tablet by mouth daily.    . rosuvastatin (CRESTOR) 5 MG tablet Take 1 tablet (5 mg total) by mouth daily. 90 tablet 3   No current facility-administered medications for this visit.    Allergies:   Tobramycin and Erythromycin   Social History:  The patient  reports that she quit smoking about 29 years ago. She has never used smokeless tobacco. She reports that she drinks alcohol. She reports that she does not use illicit drugs.   Family History:  The patient's family history includes Arthritis in her father; Diabetes in her father; Heart disease in her father and mother; Hypertension in her father and mother.   ROS:  Please see the history of present illness.   Otherwise, review of systems are positive for none.   All other systems are reviewed and negative.   PHYSICAL EXAM: VS:  BP 146/82 mmHg  Pulse 78  Ht 5\' 7"  (1.702 m)  Wt 165 lb (74.844 kg)  BMI 25.84 kg/m2 , BMI Body mass index is 25.84 kg/(m^2). GEN: Well nourished, well developed, in no acute distress HEENT: normal Neck: no JVD, carotid bruits, or masses Cardiac: RRR; no murmurs, rubs, or gallops,no edema  Respiratory:  clear to auscultation bilaterally, normal work of breathing GI: soft, nontender, nondistended, + BS MS: no deformity or atrophy Skin: warm and dry, no rash Neuro:  Strength and sensation are intact Psych: euthymic mood, full affect  EKG:   Normal sinus rhythm at 78  Recent Labs: 01/04/2016: ALT 26; BUN 12; Creat 0.71; Potassium 4.1; Sodium 138; TSH 0.72    Lipid Panel    Component Value Date/Time   CHOL 134 01/04/2016 0739   TRIG 91 01/04/2016 0739   HDL 39* 01/04/2016 0739   CHOLHDL 3.4 01/04/2016 0739   VLDL 18 01/04/2016 0739   LDLCALC 77 01/04/2016 0739    Wt Readings from Last 3 Encounters:  01/10/16 165 lb (74.844 kg)  11/25/15 168 lb  (76.204 kg)  09/12/15 165 lb (74.844 kg)    ASSESSMENT:  1. Benign hypertensive heart disease without heart failure 2. Hyperlipidemia 3. DM2 - A1c of 7.4 (not on medication) 4. atypical chest pain. 5. Hypothyroidism 6. Situational stress related to her nephew's illness. His situation is still quite tenuous.  He has a neuroendocrine carcinoma with metastases to the liver and is being treated at a teaching hospital in [place]. 7.  Peripheral neuropathy  PLAN: 1. Mrs. Shuba has good control of her blood pressure and her laboratory work is generally favorable except she has an elevated A1c of 7.4. She is not on medication for this. This is higher  than it had been previously although she says that she does not have a diagnosis of diabetes, she does meet diagnostic criteria for that. I've advised her to follow-up with her primary care provider for diabetic counseling, I've also asked her to watch her sugar intake significantly and she will likely need to go on medication for diabetes. Cholesterol appears well controlled. We will check that annually. I will not plan to follow-up with routine blood work as she had previously had from her cardiologist and defer following of her thyroid, vitamin D and hemoglobin A1c studies to her primary care provider.  Follow-up in 6 months.  Pixie Casino, MD, [facility] Attending Cardiologist [facility]  01/10/2016 4:48 PM

## 2016-01-11 ENCOUNTER — Encounter: Payer: Self-pay | Admitting: *Deleted

## 2016-09-21 ENCOUNTER — Other Ambulatory Visit: Payer: Self-pay | Admitting: General Surgery

## 2016-09-21 DIAGNOSIS — Z800 Family history of malignant neoplasm of digestive organs: Secondary | ICD-10-CM

## 2016-09-25 ENCOUNTER — Ambulatory Visit
Admission: RE | Admit: 2016-09-25 | Discharge: 2016-09-25 | Disposition: A | Discharge status: Home / Self Care | Payer: Medicare Other | Source: Ambulatory Visit | Attending: General Surgery | Admitting: General Surgery

## 2016-09-25 DIAGNOSIS — Z800 Family history of malignant neoplasm of digestive organs: Secondary | ICD-10-CM

## 2016-09-25 MED ORDER — IOPAMIDOL (ISOVUE-300) INJECTION 61%: 100.0000 mL | Freq: Once | INTRAVENOUS | Status: DC | PRN

## 2016-09-26 NOTE — Progress Notes (Signed)
Please let patient know no concerning findings.  See if she already has a follow up appt.

## 2016-09-27 ENCOUNTER — Telehealth: Payer: Self-pay | Admitting: Genetic Counselor

## 2016-09-27 ENCOUNTER — Encounter: Payer: Self-pay | Admitting: Genetic Counselor

## 2016-09-27 NOTE — Telephone Encounter (Signed)
Pt has been scheduled to see a genetic counselor on 2/23 at 930am. Pt aware to arrive early so that she can be checked in on time. Demographics verified. Letter mailed to the pt.

## 2016-10-05 ENCOUNTER — Other Ambulatory Visit: Payer: Medicare Other

## 2016-10-05 ENCOUNTER — Ambulatory Visit (HOSPITAL_BASED_OUTPATIENT_CLINIC_OR_DEPARTMENT_OTHER): Payer: Medicare Other

## 2016-10-05 DIAGNOSIS — Z809 Family history of malignant neoplasm, unspecified: Secondary | ICD-10-CM

## 2016-10-05 DIAGNOSIS — Z315 Encounter for genetic counseling: Secondary | ICD-10-CM | POA: Diagnosis not present

## 2016-10-05 DIAGNOSIS — Z808 Family history of malignant neoplasm of other organs or systems: Secondary | ICD-10-CM | POA: Diagnosis not present

## 2016-10-05 DIAGNOSIS — Z800 Family history of malignant neoplasm of digestive organs: Secondary | ICD-10-CM | POA: Diagnosis not present

## 2016-10-05 DIAGNOSIS — Z803 Family history of malignant neoplasm of breast: Secondary | ICD-10-CM | POA: Diagnosis not present

## 2016-10-05 NOTE — Progress Notes (Signed)
REFERRING PROVIDER: Stark Klein, MD  PRIMARY PROVIDER:  Tawanna Solo, MD  PRIMARY REASON FOR VISIT:  1. Family history of malignant neoplasm of gastrointestinal tract      HISTORY OF PRESENT ILLNESS:   Yvonne [name], a 71 y.o. female, was seen for a Yvonne [name] cancer genetics consultation at the request of Dr. Barry [name] due to a family history of cancer.  Yvonne [name] presents to clinic today to discuss the possibility of a hereditary predisposition to cancer, genetic testing, and to further clarify her future cancer risks, as well as potential cancer risks for family members.   Yvonne [name] is a 71 y.o. female with no personal history of cancer. She had a CT scan a week or two ago to screen for pancreatic cancer given her family history.  CANCER HISTORY:   No history exists.     HORMONAL RISK FACTORS:  Menarche was at age 47.  First live birth at age 36.  OCP use for approximately not assessed.  Ovaries intact: no.  Hysterectomy: yes.  Menopausal status: postmenopausal.  HRT use: 9 years. Colonoscopy: yes; normal. Mammogram within the last year: yes. Number of breast biopsies: 1. Up to date with pelvic exams:  n/a. Any excessive radiation exposure in the past:  no  Past Medical History:  Diagnosis Date  . Anxiety   . Chest pressure   . Degenerative arthritis   . Diabetes mellitus without complication (HCC)    diet controlled  . Heart palpitations   . History of diastolic dysfunction    per echo in 2011  . Hyperlipidemia   . Hypertension   . Hypothyroidism   . LVH (left ventricular hypertrophy)    per echo in 2011  . Normal nuclear stress test 2011  . Polyneuropathy in other diseases classified elsewhere ([place]) 05/21/2013  . Sleep difficulties   . Tinnitus     Past Surgical History:  Procedure Laterality Date  . APPENDECTOMY    . CARDIOVASCULAR STRESS TEST  11/09/2009   EF 75%  . CARPAL TUNNEL RELEASE     bilateral  . TRANSTHORACIC ECHOCARDIOGRAM   11/01/2009   NORMAL LV FILLING AND DIASTOLIC DYSFUNCTION AND MILD AORTIC SCLEROSIS AND TRACE MITRAL REGURGITATION    Social History   Social History  . Marital status: Married    Spouse name: Yvonne [name]  . Number of children: 1  . Years of education: COLLEGE   Occupational History  . Retired    Social History Main Topics  . Smoking status: Former Smoker    Quit date: 01/30/1986  . Smokeless tobacco: Never Used  . Alcohol use Yes     Comment: one glass of wine a week  . Drug use: No  . Sexual activity: Not on file   Other Topics Concern  . Not on file   Social History Narrative   Lives at home w/ her husband, Yvonne [name].   Right-handed.   Drinks no caffeine but does eat chocolate occasionally.   epworth sleepiness scale = 3 (01/10/16)     FAMILY HISTORY:  We obtained a detailed, 4-generation family history.  Significant diagnoses are listed below: Family History  Problem Relation Age of Onset  . Hypertension Mother   . Heart disease Mother   . Dementia Mother   . Heart disease Father   . Arthritis Father   . Diabetes Father   . Hypertension Father   . Cancer Brother 82    pancreatic  . Stroke Maternal Grandmother   . Heart disease  Maternal Grandfather   . Pancreatic cancer Brother   . Pneumonia Sister   . Cancer Paternal Aunt     malignant brain cancer  . Cancer Other 49    pancreatic neuroendocrine carcinoma  . Cancer Cousin 64    pancreatic  . Cancer Cousin 60    pancreatic  . Cancer Cousin 52    breast  . Cancer Cousin 67    uterine  . Cancer Cousin 61    unknown primary    Yvonne [name] believes that her maternal first cousin with pancreatic cancer tested negative for mutations in BRCA1 and BRCA2 but is unaware of any other family history of genetic testing for hereditary cancer risks. Patient's maternal ancestors are of [name] and Native American descent, and paternal ancestors are of [name] descent. There is no reported Ashkenazi Jewish ancestry. There is no  known consanguinity.  GENETIC COUNSELING ASSESSMENT: Yvonne [name] is a 71 y.o. female with a family history which is somewhat suggestive of Lynch syndrome and a hereditary predisposition to cancer. We, therefore, discussed and recommended the following at today's visit.   DISCUSSION: We reviewed the characteristics, features and inheritance patterns of hereditary cancer syndromes. We also discussed genetic testing, including the appropriate family members to test, the process of testing, insurance coverage and turn-around-time for results. We discussed the implications of a negative, positive and/or variant of uncertain significant result. We recommended Yvonne [name] pursue genetic testing for the Multi-Cancer gene panel.   Based on Yvonne [name]'s family history of cancer, she meets medical criteria for genetic testing. Despite that she meets criteria, she may still have an out of pocket cost. We discussed that if her out of pocket cost for testing is over $100, the laboratory will call and confirm whether she wants to proceed with testing.  If the out of pocket cost of testing is less than $100 she will be billed by the genetic testing laboratory.    PLAN: After considering the risks, benefits, and limitations, Yvonne [name]  provided informed consent to pursue genetic testing and the blood sample was sent to [facility] for analysis of the Multi-Cancer Gene Panel test. This test includes sequencing and/or deletion duplication testing of the following 80 genes: ALK, APC, ATM, AXIN2,BAP1,  BARD1, BLM, BMPR1A, BRCA1, BRCA2, BRIP1, CASR, CDC73, CDH1, CDK4, CDKN1B, CDKN1C, CDKN2A (p14ARF), CDKN2A (p16INK4a), CEBPA, CHEK2, DICER1, CIS3L2, EGFR (c.2369C>T, p.Thr790Met variant only), EPCAM (Deletion/duplication testing only), FH, FLCN, GATA2, GPC3, GREM1 (Promoter region deletion/duplication testing only), HOXB13 (c.251G>A, p.Gly84Glu), HRAS, KIT, MAX, MEN1, MET, MITF (c.952G>A, p.Glu318Lys variant  only), MLH1, MSH2, MSH6, MUTYH, NBN, NF1, NF2, PALB2, PDGFRA, PHOX2B, PMS2, POLD1, POLE, POT1, PRKAR1A, PTCH1, PTEN, RAD50, RAD51C, RAD51D, RB1, RECQL4, RET, RUNX1, SDHAF2, SDHA (sequence changes only), SDHB, SDHC, SDHD, SMAD4, SMARCA4, SMARCB1, SMARCE1, STK11, SUFU, TERT, TERT, TMEM127, TP53, TSC1, TSC2, VHL, WRN and WT1. Results should be available within approximately 2-3 weeks' time, at which point they will be disclosed by telephone to Yvonne [name], as will any additional recommendations warranted by these results. Yvonne [name] will receive a summary of her genetic counseling visit and a copy of her results once available. This information will also be available in Epic. We encouraged Yvonne [name] to remain in contact with cancer genetics annually so that we can continuously update the family history and inform her of any changes in cancer genetics and testing that may be of benefit for her family. Yvonne [name] questions were answered to her satisfaction today. Our contact information was provided  should additional questions or concerns arise.  We discussed the limitations of a negative result for Yvonne [name]. In that situation, we would not know if she did not inherit the genetic factor causing an increased risk for cancer in the family, and therefore be at population level risk, or if the hereditary factor is currently not able to be identified for the cancer in the family based our current knowledge of cancer genetics.  Lastly, we encouraged Yvonne [name] to remain in contact with cancer genetics annually so that we can continuously update the family history and inform her of any changes in cancer genetics and testing that may be of benefit for this family.   Ms.  [name] questions were answered to her satisfaction today. Our contact information was provided should additional questions or concerns arise. Thank you for the referral and allowing [name] to share in the care of your patient.   Benay Pike, MS, [facility] Certified Genetic Counselor  The patient was seen for a total of 60 minutes in face-to-face genetic counseling.  This patient was discussed with Drs. Magrinat, Lindi Adie and/or Burr Medico who agrees with the above.    _______________________________________________________________________ For Office Staff:  Number of people involved in session: 1 Was an Intern/ student involved with case: no

## 2016-10-15 ENCOUNTER — Telehealth: Payer: Self-pay | Admitting: Genetic Counselor

## 2016-10-15 ENCOUNTER — Ambulatory Visit: Payer: Self-pay | Admitting: Genetic Counselor

## 2016-10-15 DIAGNOSIS — Z1379 Encounter for other screening for genetic and chromosomal anomalies: Secondary | ICD-10-CM

## 2016-10-15 DIAGNOSIS — Z800 Family history of malignant neoplasm of digestive organs: Secondary | ICD-10-CM

## 2016-10-15 NOTE — Progress Notes (Signed)
[facility]    Patient Name: Yvonne [name] Patient DOB: [birth year] Patient Age: 71 y.o. Encounter Date: 10/15/2016  Referring Provider: Stark Klein, MD  Primary Care Provider: Tawanna Solo, MD  Yvonne [name] was called today to discuss genetic test results. Please see the Genetics note from her visit on 10/05/16 for a detailed discussion of her personal and family history.  Genetic Testing: At the time of Yvonne [name]'s visit, we recommended she pursue genetic testing of multiple genes associated with a hereditary predisposition to cancer. Testing included sequencing and deletion/duplication analysis of 43 genes on [facility]'s Common Cancers panel (APC, ATM, AXIN2, BARD1, BMPR1A, BRCA1, BRCA2, BRIP1, CDH1, CDKN2A, CHEK2, DICER1, EPCAM, GREM1, HOXB13, KIT, MEN1, MLH1, MSH2, MSH6, MUTYH, NBN, NF1, PALB2, PDGFRA, PMS2, POLD1, POLE, PTEN, RAD50, RAD51C, RAD51D, SDHA, SDHB, SDHC, SDHD, SMAD4, SMARCA4, STK11, TP53, TSC1, TSC2, VHL). Testing was normal and did not reveal a mutation in these genes. A copy of the genetic test report will be scanned into Epic under the media tab.  Since the current test is not perfect, it is possible there may be a gene mutation that current testing cannot detect, but that chance is small. We also discussed that it is possible that a different genetic factor, which was not part of this testing or has not yet been discovered, is responsible for the cancer diagnoses in the family. Again, the likelihood of this is low. No additional testing is recommended at this time.     Cancer Screening: Given the personal and family histories, we must interpret these negative results with some caution. Families with features suggestive of hereditary risk tend to have multiple family members with cancer, diagnoses in multiple generations and diagnoses before the age of 82. Yvonne [name] family exhibits some of these features. This result  may simply reflect our current inability to detect all mutations within these genes. It is also possible there is a different gene, that we have not yet tested, that may be responsible for the cancer in this family.   Having one first degree relative with pancreatic cancer means that Yvonne [name] has around a 7% chance of developing pancreatic cancer in her lifetime. While there is no recommended pancreatic cancer screening at this time, she could discuss the options further with her providers.  Family Members: While these results are reassuring for Yvonne [name], this test does not tell Yvonne [name] anything about Yvonne [name]'s nephew's genetic status. We recommended that her nephew, son of her brother with pancreatic cancer, also have genetic counseling and testing. Please let Yvonne [name] know if we can help facilitate testing. Genetic counselors can be located in other cities, by visiting the website of the [employer] ([employer]ist[employer][email or web address]) and Field seismologist for a Dietitian by zip code.    Lastly, cancer genetics is a rapidly advancing field and it is possible that new genetic tests will be appropriate for her in the future. We encourage her to remain in contact with Yvonne [name] on an annual basis so we can update her personal and family histories, and let her know of advances in cancer genetics that may benefit the family. Our contact number was provided. Yvonne [name] is welcome to call anytime with additional questions.    Marylouise Stacks, MS, [facility] Certified Genetic Counselor phone: [phone] [email or web address]

## 2016-10-15 NOTE — Progress Notes (Signed)
CORRECTION: [name] was negative for pathogenic variants in the 80 genes of the Multi-Gene Panel offered by [facility]: ALK, APC, ATM, AXIN2,BAP1,  BARD1, BLM, BMPR1A, BRCA1, BRCA2, BRIP1, CASR, CDC73, CDH1, CDK4, CDKN1B, CDKN1C, CDKN2A (p14ARF), CDKN2A (p16INK4a), CEBPA, CHEK2, DICER1, CIS3L2, EGFR (c.2369C>T, p.Thr790Met variant only), EPCAM (Deletion/duplication testing only), FH, FLCN, GATA2, GPC3, GREM1 (Promoter region deletion/duplication testing only), HOXB13 (c.251G>A, p.Gly84Glu), HRAS, KIT, MAX, MEN1, MET, MITF (c.952G>A, p.Glu318Lys variant only), MLH1, MSH2, MSH6, MUTYH, NBN, NF1, NF2, PALB2, PDGFRA, PHOX2B, PMS2, POLD1, POLE, POT1, PRKAR1A, PTCH1, PTEN, RAD50, RAD51C, RAD51D, RB1, RECQL4, RET, RUNX1, SDHAF2, SDHA (sequence changes only), SDHB, SDHC, SDHD, SMAD4, SMARCA4, SMARCB1, SMARCE1, STK11, SUFU, TERT, TERT, TMEM127, TP53, TSC1, TSC2, VHL, WRN and Wt1.  The previous progress note referenced the incorrect test.

## 2016-10-15 NOTE — Telephone Encounter (Signed)
Discussed negative genetic testing results with patient. Please see my encounter note for more details.

## 2016-11-20 ENCOUNTER — Telehealth: Payer: Self-pay

## 2016-11-20 NOTE — Telephone Encounter (Signed)
Pt has been scheduled for 12/21/16 with Dr Ardis Hughs

## 2016-11-20 NOTE — Telephone Encounter (Signed)
-----   Message from [name], MD sent at 11/20/2016  7:21 AM EDT ----- i'm happy to see her.  As far as screening for pancreatic cancer, there really is none, even in high risk families outside of research protocols.    Jumaane Weatherford, She needs NGI with me, next available to discuss family history of pancreatic cancer, consider colon cancer screening.  Thanks   ----- Message ----- From: Stark Klein, MD Sent: 11/19/2016   5:05 PM To: [name], MD  I am wondering if this lady can transfer care to you.  She is mom of one of our Blaine.    She has crazy family history with brother that died of pancreatic cancer and nephew with panc cancer, and two maternal cousins with pancreatic cancer.  I have done screening panc protocol CT for back pain and new onset diabetes.  She has a tiny distal pancreatic cyst.    I don't think she needs EUS now, but may in future.  She had a colonoscopy with Dr. Amedeo Plenty in 2010, but wants to switch.    Other family history includes breast cancer, bladder cancer, uterine cancer.    Genetics negative.    She is wondering about earlier than 10 years colonoscopy.  What do you think?  Can you see her to discuss?  No rush.  tx FB

## 2016-11-26 ENCOUNTER — Telehealth: Payer: Self-pay | Admitting: *Deleted

## 2016-11-26 ENCOUNTER — Ambulatory Visit: Payer: Medicare Other | Admitting: Adult Health

## 2016-11-26 NOTE — Telephone Encounter (Signed)
Left message on home and cell numbers that power is out.  We need to call her to reschedule a follow up with [facility].

## 2016-11-27 ENCOUNTER — Encounter (HOSPITAL_COMMUNITY): Payer: Self-pay

## 2016-11-27 NOTE — Telephone Encounter (Signed)
Called patient and rescheduled her FU for tomorrow.

## 2016-11-28 ENCOUNTER — Ambulatory Visit (INDEPENDENT_AMBULATORY_CARE_PROVIDER_SITE_OTHER): Payer: Medicare Other | Admitting: Adult Health

## 2016-11-28 ENCOUNTER — Encounter: Payer: Self-pay | Admitting: Adult Health

## 2016-11-28 VITALS — BP 132/83 | HR 83 | Wt 170.4 lb

## 2016-11-28 DIAGNOSIS — G63 Polyneuropathy in diseases classified elsewhere: Secondary | ICD-10-CM | POA: Diagnosis not present

## 2016-11-28 NOTE — Progress Notes (Signed)
PATIENT: Yvonne [name] DOB: [birth year]  REASON FOR VISIT: follow up- peripheral neuropathy  HISTORY FROM: patient  HISTORY OF PRESENT ILLNESS: Yvonne [name] is a 71 year old female with a history of peripheral neuropathy. She returns today for follow-up. She states overall her neuropathy has remained fairly the same. She notices the most trouble at night. She states that she does notice it throughout the day however she is busy so that does help. She describes it as a hot prickly sensation on the feet. She typically has to wear socks at all times. She does have some numbness under the toes. Denies any gait or balance issues. She states that she does use a heating blanket at night. She continues to only take gabapentin 300 mg at bedtime. She states Dr. Jannifer Franklin did give her 100 mg tablet house ever she has not added this to her regimen. She states that her last hemoglobin A1c was 6.8. She states that she continues to diet in order to get this number down. She is curious if her neuropathy is coming from Crestor or diabetes. She states that when she initially was diagnosed with neuropathy she was not diabetic. She returns today for an evaluation.  HISTORY 11/25/15: Yvonne [name] is a 71 year old right-handed white female with a history of a peripheral neuropathy associated with diabetes. The patient has mainly diet-controlled diabetes, but her hemoglobin A1c has gone up to 7.1. The patient has features of a small fiber neuropathy with temperature sensation alteration, and some discomfort. The patient reports that her feet feel are cold much of the day, this bothers her at night. The patient has to wear socks at all times. She has itching sensations in the low part of the back at times, and she has a disc bulge in her neck with some neck discomfort. The patient is on gabapentin taking 300 mg night, she never went up to the 600 mg dose. Most of her discomfort is in the nighttime. The patient gets about 4 hours  of good sleep with the gabapentin. She has had some swelling in ankles, she reports some weight gain. She denies any significant issues with balance, she has not had any falls. She comes to this office for an evaluation.  REVIEW OF SYSTEMS: Out of a complete 14 system review of symptoms, the patient complains only of the following symptoms, and all other reviewed systems are negative.  Hearing loss, ringing in ears, snoring, joint pain, moles   ALLERGIES: Allergies  Allergen Reactions  . Tobramycin Hives  . Erythromycin Nausea Only    HOME MEDICATIONS: Outpatient Medications Prior to Visit  Medication Sig Dispense Refill  . ALPRAZolam (XANAX) 0.25 MG tablet Take 0.25 mg by mouth 3 (three) times daily as needed for anxiety.    Marland Kitchen aspirin 81 MG tablet Take 81 mg by mouth daily.      . cholecalciferol (VITAMIN D) 1000 UNITS tablet Take 1,000 Units by mouth daily.    . furosemide (LASIX) 20 MG tablet Take 10 mg by mouth daily.    Marland Kitchen gabapentin (NEURONTIN) 100 MG capsule Take 100 mg by mouth 3 (three) times daily as needed.    . gabapentin (NEURONTIN) 300 MG capsule 1 TO 2 TABLETS BY MOUTH AT BEDTIME 180 capsule 0  . levothyroxine (SYNTHROID) 125 MCG tablet TAKE 1 TABLET BY MOUTH DAILY EXCEPT SUNDAYS 30 tablet 0  . metoprolol succinate (TOPROL-XL) 25 MG 24 hr tablet TAKE 1/2 TABLET BY MOUTH DAILY. 45 tablet 3  . metroNIDAZOLE (  METROCREAM) 0.75 % cream Apply 1 application topically daily. (ROSACEA)    . omeprazole (PRILOSEC) 10 MG capsule Take 10 mg by mouth as needed (indigestion).     . potassium chloride SA (K-DUR,KLOR-CON) 20 MEQ tablet TAKE 1/2 TABLET BY MOUTH DAILY. 45 tablet 3  . Prenatal Vit-Fe Fumarate-FA (MULTIVITAMIN-PRENATAL) 27-0.8 MG TABS Take 1 tablet by mouth daily.    . rosuvastatin (CRESTOR) 5 MG tablet Take 1 tablet (5 mg total) by mouth daily. 90 tablet 3  . fluticasone (FLONASE) 50 MCG/ACT nasal spray INSTILL TWO SPRAYS INTO EACH NOSTRIL ONCE A DAY 16 g 0   No  facility-administered medications prior to visit.     PAST MEDICAL HISTORY: Past Medical History:  Diagnosis Date  . Anxiety   . Chest pressure   . Degenerative arthritis   . Diabetes mellitus without complication (HCC)    diet controlled  . Heart palpitations   . History of diastolic dysfunction    per echo in 2011  . Hyperlipidemia   . Hypertension   . Hypothyroidism   . LVH (left ventricular hypertrophy)    per echo in 2011  . Normal nuclear stress test 2011  . Polyneuropathy in other diseases classified elsewhere ([place]) 05/21/2013  . Sleep difficulties   . Tinnitus     PAST SURGICAL HISTORY: Past Surgical History:  Procedure Laterality Date  . APPENDECTOMY    . CARDIOVASCULAR STRESS TEST  11/09/2009   EF 75%  . CARPAL TUNNEL RELEASE     bilateral  . TRANSTHORACIC ECHOCARDIOGRAM  11/01/2009   NORMAL LV FILLING AND DIASTOLIC DYSFUNCTION AND MILD AORTIC SCLEROSIS AND TRACE MITRAL REGURGITATION    FAMILY HISTORY: Family History  Problem Relation Age of Onset  . Hypertension Mother   . Heart disease Mother   . Dementia Mother   . Heart disease Father   . Arthritis Father   . Diabetes Father   . Hypertension Father   . Cancer Brother 73    pancreatic  . Stroke Maternal Grandmother   . Heart disease Maternal Grandfather   . Pancreatic cancer Brother   . Pneumonia Sister   . Cancer Paternal Aunt     malignant brain cancer  . Cancer Other 49    pancreatic neuroendocrine carcinoma  . Cancer Cousin 64    pancreatic  . Cancer Cousin 60    pancreatic  . Cancer Cousin 52    breast  . Cancer Cousin 67    uterine  . Cancer Cousin 60    unknown primary    SOCIAL HISTORY: Social History   Social History  . Marital status: Married    Spouse name: Edd Fabian  . Number of children: 1  . Years of education: COLLEGE   Occupational History  . Retired    Social History Main Topics  . Smoking status: Former Smoker    Quit date: 01/30/1986  . Smokeless tobacco:  Never Used  . Alcohol use Yes     Comment: one glass of wine a week  . Drug use: No  . Sexual activity: Not on file   Other Topics Concern  . Not on file   Social History Narrative   Lives at home w/ her husband, Edd Fabian.   Right-handed.   Drinks no caffeine but does eat chocolate occasionally.   epworth sleepiness scale = 3 (01/10/16)      PHYSICAL EXAM  Vitals:   11/28/16 1031  BP: 132/83  Pulse: 83  Weight: 170 lb 6.4 oz (77.3  kg)   Body mass index is 26.69 kg/m.  Generalized: Well developed, in no acute distress   Neurological examination  Mentation: Alert oriented to time, place, history taking. Follows all commands speech and language fluent Cranial nerve II-XII: Pupils were equal round reactive to light. Extraocular movements were full, visual field were full on confrontational test. Facial sensation and strength were normal. Uvula tongue midline. Head turning and shoulder shrug  were normal and symmetric. Motor: The motor testing reveals 5 over 5 strength of all 4 extremities. Good symmetric motor tone is noted throughout.  Sensory: Sensory testing is intact to soft touch on all 4 extremities. Pinprick sensation decreased in stocking pattern in the lower extremities. No evidence of extinction is noted.  Coordination: Cerebellar testing reveals good finger-nose-finger and heel-to-shin bilaterally.  Gait and station: Gait is normal. Tandem gait is normal. Romberg is negative. No drift is seen.  Reflexes: Deep tendon reflexes are symmetric and normal bilaterally.   DIAGNOSTIC DATA (LABS, IMAGING, TESTING) - I reviewed patient records, labs, notes, testing and imaging myself where available.  Lab Results  Component Value Date   WBC 5.8 10/08/2011   HGB 15.0 10/08/2011   HCT 43.8 10/08/2011   MCV 81.9 10/08/2011   PLT 246.0 10/08/2011      Component Value Date/Time   NA 138 01/04/2016 0739   K 4.1 01/04/2016 0739   CL 103 01/04/2016 0739   CO2 27 01/04/2016  0739   GLUCOSE 136 (H) 01/04/2016 0739   BUN 12 01/04/2016 0739   CREATININE 0.71 01/04/2016 0739   CALCIUM 9.1 01/04/2016 0739   PROT 6.7 01/04/2016 0739   PROT 6.8 05/21/2013 0900   ALBUMIN 4.2 01/04/2016 0739   AST 23 01/04/2016 0739   ALT 26 01/04/2016 0739   ALKPHOS 47 01/04/2016 0739   BILITOT 0.7 01/04/2016 0739   Lab Results  Component Value Date   CHOL 134 01/04/2016   HDL 39 (L) 01/04/2016   LDLCALC 77 01/04/2016   TRIG 91 01/04/2016   CHOLHDL 3.4 01/04/2016   Lab Results  Component Value Date   HGBA1C 7.4 (H) 01/04/2016   Lab Results  Component Value Date   WCHENIDP82 423 04/10/2013   Lab Results  Component Value Date   TSH 0.72 01/04/2016      ASSESSMENT AND PLAN 71 y.o. year old female  has a past medical history of Anxiety; Chest pressure; Degenerative arthritis; Diabetes mellitus without complication ([place]); Heart palpitations; History of diastolic dysfunction; Hyperlipidemia; Hypertension; Hypothyroidism; LVH (left ventricular hypertrophy); Normal nuclear stress test (2011); Polyneuropathy in other diseases classified elsewhere ([place]) (05/21/2013); Sleep difficulties; and Tinnitus. here with:  1. Peripheral neuropathy    The patient will continue on gabapentin 300 mg at bedtime. She does have 100 mg tablet that she can add either in the morning or in addition to her nighttime dose. The patient had many questions today regarding her diagnosis of neuropathy and possible causes and treatment. I answered all questions for the patient today. Advised patient that if her symptoms worsen or she develops new symptoms she should let us know. She will follow-up in one year with Dr. Jannifer Franklin.    I spent 25 minutes with the patient 50% of this time was spent answering the patient's questions in regards to her diagnoses and possible treatment and prevention.     Ward Givens, MSN, NP-C 11/28/2016, 11:12 AM [facility] [address], [place], [place] [address] [phone]

## 2016-11-28 NOTE — Progress Notes (Signed)
I have read the note, and I agree with the clinical assessment and plan.  WILLIS,CHARLES KEITH   

## 2016-11-28 NOTE — Patient Instructions (Signed)
Continue Gabapentin If your symptoms worsen or you develop new symptoms please let us know.

## 2016-12-21 ENCOUNTER — Ambulatory Visit (INDEPENDENT_AMBULATORY_CARE_PROVIDER_SITE_OTHER): Payer: Medicare Other | Admitting: Gastroenterology

## 2016-12-21 ENCOUNTER — Encounter: Payer: Self-pay | Admitting: Gastroenterology

## 2016-12-21 VITALS — BP 124/78 | HR 80 | Ht 66.25 in | Wt 169.2 lb

## 2016-12-21 DIAGNOSIS — Z800 Family history of malignant neoplasm of digestive organs: Secondary | ICD-10-CM | POA: Diagnosis not present

## 2016-12-21 NOTE — Patient Instructions (Addendum)
MRI of pancreas with MRCP in 09/2017 (pancreatic cyst follow up) which is already arranged through Dr. Barry Dienes. We will get records sent from your previous gastroenterologist (Dr. Amedeo Plenty at [facility])  for review.  This will include any endoscopic (colonoscopy or upper endoscopy) procedures and any associated pathology reports.  After reviewing that, will decide on exact date of next screening colonoscopy (sometime in 2020).

## 2016-12-21 NOTE — Progress Notes (Signed)
HPI: This is a  very pleasant 71 year old woman who was referred to me by Kathyrn Lass, MD  to evaluate  family history pancreatic cancer .    Chief complaint is pancreatic cancer in [place]   Brother died of pancreatic cancer at age 52.  1st cousin and his sister both died of pancreatic cancer.  A nephew diagnosed with PNET and died from it, was very aggressive.  She has diabetes type 2. Says her A1C level really is weight dependent.  She has no abdominal pains, no change in her bowels, no overt GI bleeding, no fevers or chills.  Old Data Reviewed: CT scan 09/2016:  7 mm unilocular cystic lesion along the pancreatic tail, without enhancement, likely benign. Primary differential considerations include a pseudocyst or side branch IPMN. Recommend follow up pre and post contrast MRI/MRCP or pancreatic protocol CT in 2 years.   Genetic counseling note 10/2016: (had negative genetic testing labs) Cancer Screening: Given the personal and family histories, we must interpret these negative results with some caution. Families with features suggestive of hereditary risk tend to have multiple family members with cancer, diagnoses in multiple generations and diagnoses before the age of 34. Ms. Deblois family exhibits some of these features. This result may simply reflect our current inability to detect all mutations within these genes. It is also possible there is a different gene, that we have not yet tested, that may be responsible for the cancer in this family. Having one first degree relative with pancreatic cancer means that Ms. Quintanilla has around a 7% chance of developing pancreatic cancer in her lifetime. While there is no recommended pancreatic cancer screening at this time, she could discuss the options further with her providers.    Colonoscopy Dr. Starr [facility] 2010, March.  This was normal.  No GI symptoms at all: no overt bleeding, no changes in her bowels. Weight fluctuates.  No abd  pains.  She has mild indigestion that responds well to tums, only needed pretty rarely.  Non smoker, rare drinker.   Review of systems: Pertinent positive and negative review of systems were noted in the above HPI section. Complete review of systems was performed and was otherwise normal.   Past Medical History:  Diagnosis Date  . Anxiety   . Chest pressure   . Degenerative arthritis   . Diabetes mellitus without complication (HCC)    diet controlled  . Heart palpitations   . History of diastolic dysfunction    per echo in 2011  . Hyperlipidemia   . Hypertension   . Hypothyroidism   . LVH (left ventricular hypertrophy)    per echo in 2011  . Normal nuclear stress test 2011  . Polyneuropathy in other diseases classified elsewhere ([place]) 05/21/2013  . Sleep difficulties   . Tinnitus     Past Surgical History:  Procedure Laterality Date  . APPENDECTOMY    . CARDIOVASCULAR STRESS TEST  11/09/2009   EF 75%  . CARPAL TUNNEL RELEASE     bilateral  . TRANSTHORACIC ECHOCARDIOGRAM  11/01/2009   NORMAL LV FILLING AND DIASTOLIC DYSFUNCTION AND MILD AORTIC SCLEROSIS AND TRACE MITRAL REGURGITATION    Current Outpatient Prescriptions  Medication Sig Dispense Refill  . ALPRAZolam (XANAX) 0.25 MG tablet Take 0.25 mg by mouth 3 (three) times daily as needed for anxiety.    Marland Kitchen aspirin 81 MG tablet Take 81 mg by mouth daily.      . cholecalciferol (VITAMIN D) 1000 UNITS tablet Take  1,000 Units by mouth daily.    Marland Kitchen FLUTICASONE PROPIONATE EX Apply topically as needed.    . furosemide (LASIX) 20 MG tablet Take 10 mg by mouth daily.    Marland Kitchen gabapentin (NEURONTIN) 100 MG capsule Take 100 mg by mouth 3 (three) times daily as needed.    . gabapentin (NEURONTIN) 300 MG capsule 1 TO 2 TABLETS BY MOUTH AT BEDTIME 180 capsule 0  . levothyroxine (SYNTHROID) 125 MCG tablet TAKE 1 TABLET BY MOUTH DAILY EXCEPT SUNDAYS 30 tablet 0  . metoprolol succinate (TOPROL-XL) 25 MG 24 hr tablet TAKE 1/2 TABLET BY  MOUTH DAILY. 45 tablet 3  . metroNIDAZOLE (METROCREAM) 0.75 % cream Apply 1 application topically daily. (ROSACEA)    . omeprazole (PRILOSEC) 10 MG capsule Take 10 mg by mouth as needed (indigestion).     . potassium chloride SA (K-DUR,KLOR-CON) 20 MEQ tablet TAKE 1/2 TABLET BY MOUTH DAILY. 45 tablet 3  . Prenatal Vit-Fe Fumarate-FA (MULTIVITAMIN-PRENATAL) 27-0.8 MG TABS Take 1 tablet by mouth daily.    . rosuvastatin (CRESTOR) 5 MG tablet Take 1 tablet (5 mg total) by mouth daily. 90 tablet 3   No current facility-administered medications for this visit.     Allergies as of 12/21/2016 - Review Complete 12/21/2016  Allergen Reaction Noted  . Tobramycin Hives 01/31/2011  . Erythromycin Nausea Only 12/28/2011    Family History  Problem Relation Age of Onset  . Hypertension Mother   . Heart disease Mother   . Dementia Mother   . Heart disease Father   . Arthritis Father   . Diabetes Father   . Hypertension Father   . Cancer Brother 59       pancreatic  . Other Brother        automobile accident  . Stroke Maternal Grandmother   . Heart disease Maternal Grandfather   . Cancer Paternal Aunt        malignant brain cancer  . Cancer Other 49       pancreatic neuroendocrine carcinoma  . Cancer Cousin 64       pancreatic  . Cancer Cousin 60       pancreatic  . Cancer Cousin 52       breast  . Cancer Cousin 67       uterine  . Cancer Cousin 75       unknown primary  . Colon cancer Neg Hx     Social History   Social History  . Marital status: Married    Spouse name: Edd Fabian  . Number of children: 1  . Years of education: COLLEGE   Occupational History  . Retired    Social History Main Topics  . Smoking status: Former Smoker    Quit date: 01/30/1986  . Smokeless tobacco: Never Used  . Alcohol use Yes     Comment: one glass of wine a week  . Drug use: No  . Sexual activity: Not on file   Other Topics Concern  . Not on file   Social History Narrative   Lives at  home w/ her husband, Edd Fabian.   Right-handed.   Drinks no caffeine but does eat chocolate occasionally.   epworth sleepiness scale = 3 (01/10/16)     Physical Exam: BP 124/78   Pulse 80   Ht 5' 6.25" (1.683 m)   Wt 169 lb 4 oz (76.8 kg)   BMI 27.11 kg/m  Constitutional: generally well-appearing Psychiatric: alert and oriented x3 Eyes: extraocular movements intact Mouth:  oral pharynx moist, no lesions Neck: supple no lymphadenopathy Cardiovascular: heart regular rate and rhythm Lungs: clear to auscultation bilaterally Abdomen: soft, nontender, nondistended, no obvious ascites, no peritoneal signs, normal bowel sounds Extremities: no lower extremity edema bilaterally Skin: no lesions on visible extremities   Assessment and plan: 71 y.o. female with  family history pancreas cancer, routine risk for colon cancer  We discussed pancreas cancer and the fact that no screening algorithms abdomen showed make a difference in preventing death from pancreatic cancer. Any pancreatic screening algorithms are still considered investigational and to be done only under study protocol. I recommended she follow AGA guidelines for the incidental pancreatic cyst that I agree looks very underwhelming by CT scan. That would mean MRI one year from her pancreatic CT scan images are a scheduled by Dr. Barry Dienes to be done in February 2019. We will get records from her previous gastroenterologist to find the exact date of her 2010 colonoscopy and at that time I will let her know when she is due for next routine colon cancer screening.  Greater than 50% of this visit was spent in direct face-to-face counseling.  Total time of this visit was 24min.   Please see the "Patient Instructions" section for addition details about the plan.   Owens Loffler, MD [facility] 12/21/2016, 9:12 AM  Cc: Kathyrn Lass, MD

## 2017-01-03 ENCOUNTER — Telehealth: Payer: Self-pay | Admitting: Gastroenterology

## 2017-01-03 NOTE — Telephone Encounter (Signed)
ROI Faxed to [facility] [phone] mc

## 2017-01-03 NOTE — Telephone Encounter (Signed)
ROI fax to [facility] @ [place]

## 2017-01-04 ENCOUNTER — Telehealth: Payer: Self-pay | Admitting: Gastroenterology

## 2017-01-04 NOTE — Telephone Encounter (Signed)
Colon recall entered into EPIC Left message on machine to call back

## 2017-01-04 NOTE — Telephone Encounter (Signed)
Colonoscopy Dr. Amedeo Plenty 10/13/2008: done for screening, constipation: hemorrhoids, o/w normal.     Please let her know I received records from [facility], needs recall colonoscopy 10/2018

## 2017-01-04 NOTE — Telephone Encounter (Signed)
See alternate note  

## 2017-01-08 NOTE — Telephone Encounter (Signed)
The patient has been notified of this information and all questions answered.

## 2017-05-28 DIAGNOSIS — H9193 Unspecified hearing loss, bilateral: Secondary | ICD-10-CM | POA: Insufficient documentation

## 2017-05-28 DIAGNOSIS — J302 Other seasonal allergic rhinitis: Secondary | ICD-10-CM | POA: Insufficient documentation

## 2017-05-28 DIAGNOSIS — H6123 Impacted cerumen, bilateral: Secondary | ICD-10-CM | POA: Insufficient documentation

## 2017-05-28 DIAGNOSIS — H9313 Tinnitus, bilateral: Secondary | ICD-10-CM | POA: Insufficient documentation

## 2017-06-18 ENCOUNTER — Telehealth: Payer: Self-pay | Admitting: Adult Health

## 2017-06-18 NOTE — Telephone Encounter (Signed)
I called the patient.  I am not aware of this product, I do know that comfortable footwear can sometimes significant improved neuropathy discomfort.  I think that this will be a trial and error issue.  I have no problem with her trying this product.

## 2017-06-18 NOTE — Telephone Encounter (Signed)
Pt called she has seen advertisement for PROTALUS, a new insole for all kinds of foot issues. She is wanting to know if you or Dr Viona Gilmore has heard of it or would have any thoughts as to if would help. She said it cost about $80. Please call to advise

## 2017-06-20 ENCOUNTER — Encounter: Payer: Medicare Other | Attending: Family Medicine | Admitting: Registered"

## 2017-06-20 DIAGNOSIS — E7800 Pure hypercholesterolemia, unspecified: Secondary | ICD-10-CM | POA: Diagnosis present

## 2017-06-20 DIAGNOSIS — E669 Obesity, unspecified: Secondary | ICD-10-CM | POA: Insufficient documentation

## 2017-06-20 DIAGNOSIS — I10 Essential (primary) hypertension: Secondary | ICD-10-CM | POA: Diagnosis not present

## 2017-06-20 DIAGNOSIS — E119 Type 2 diabetes mellitus without complications: Secondary | ICD-10-CM | POA: Insufficient documentation

## 2017-06-20 NOTE — Progress Notes (Signed)
Diabetes Self-Management Education  Visit Type: First/Initial  Appt. Start Time: 0930 Appt. End Time: 1100  06/20/2017  Yvonne [name], identified by name and date of birth, is a 71 y.o. female with a diagnosis of Diabetes: Type 2.   ASSESSMENT Pt states she has had pre-diabetes for awhile but in June her A1c jumped to 7.9%. Pt states during the time prior to that she had a lot of stress due to family health issues and death of a family member. In addition to the stress, pt states she started drinking soda and eating candy much more than what she had been doing. Pt states she did not want to start medication, but rather try to bring it down by changing her habits, which she did and in Sept A1c was 6.6%. Pt states she has experienced about 18 lbs of weight loss with cutting out sweets. Pt states she feels good at her current weight. Pt reports some people have commented that she looks too thin. Pt does not want to lose more weight.  Pt states her current eating pattern is not sustainable because she will not go the rest of her life never eating ice cream or sweets, and may even consider medication if that is what it takes to include these foods in her diet. Pt reports her hesitation to eat sweets is her fear that it will spike her blood sugar and she doesn't know what that will feel like. Pt states she remembers getting a glucometer years ago, but has never checked her blood sugar.   Pt states she has neuropathy but doesn't know if it is due to diabetes. Pt states she has family history of quite a few chronic diseases including diabetes.  At next visit RD plans to cover topics of hyper and hypoglycemia, types of fat, more detail about carb counting, meal planning, and tips for eating out.  Diabetes Self-Management Education - 06/20/17 0948      Visit Information   Visit Type  First/Initial      Initial Visit   Diabetes Type  Type 2    Are you currently following a meal plan?  Yes low  sugar   low sugar   Date Diagnosed  June 2018      Health Coping   How would you rate your overall health?  Excellent      Psychosocial Assessment   Patient Belief/Attitude about Diabetes  Motivated to manage diabetes    How often do you need to have someone help you when you read instructions, pamphlets, or other written materials from your doctor or pharmacy?  1 - Never    What is the last grade level you completed in school?  Masters      Complications   Last HgB A1C per patient/outside source  6.6 % per referral 04/2017   per referral 04/2017   How often do you check your blood sugar?  0 times/day (not testing)    Have you had a dilated eye exam in the past 12 months?  Yes    Have you had a dental exam in the past 12 months?  Yes    Are you checking your feet?  Yes    How many days per week are you checking your feet?  5      Dietary Intake   Breakfast  cheerios, milk a little sugar, coffee sometimes    Snack (morning)  cheese, nuts or fruit    Lunch  popcorn OR [place] sandwich,  1 piece of bread, fruit OR chicken nuggets OR salad, grilled, a few croutons, a little bread sometimes    Snack (afternoon)  fruit, nuts or popcorn    Dinner  mozzerlla sticks, carrot sticks last night OR grilled chicken, potato, salad OR steak, potato, asparagus OR pasta, chicken alfredo sauce, salad    Snack (evening)  popcorn OR fruit    Beverage(s)  water, coffee      Exercise   Exercise Type  Light (walking / raking leaves)    How many days per week to you exercise?  5    How many minutes per day do you exercise?  20    Total minutes per week of exercise  100      Patient Education   Previous Diabetes Education  No    Nutrition management   Role of diet in the treatment of diabetes and the relationship between the three main macronutrients and blood glucose level;Food label reading, portion sizes and measuring food.;Reviewed blood glucose goals for pre and post meals and how to evaluate the  patients' food intake on their blood glucose level.    Monitoring  Identified appropriate SMBG and/or A1C goals.;Purpose and frequency of SMBG.;Taught/evaluated SMBG meter.      Individualized Goals (developed by patient)   Nutrition  General guidelines for healthy choices and portions discussed    Monitoring   test my blood glucose as discussed      Outcomes   Expected Outcomes  Demonstrated interest in learning. Expect positive outcomes    Future DMSE  4-6 wks    Program Status  Not Completed     Individualized Plan for Diabetes Self-Management Training:   Learning Objective:  Patient will have a greater understanding of diabetes self-management. Patient education plan is to attend individual and/or group sessions per assessed needs and concerns.   Patient Instructions   Doristine Devoid job on staying active! Keep up your frequent walks outside  Aim to eat balanced meals and snacks, eating protein whenever you eat carbohydrates.  Consider having 3-4 carb servings or 45-60 grams per meal  Consider having 0-1 carb servings or 0-15 grams per snack  As you are experimenting with including ice cream or other sweet foods in your diet, aim to start with 15 g or less carbohydrate and always include a protein. Check your blood sugar 1-2 hours after you eat to monitor the effect of the food on your blood sugar.  To know if you are controlling your blood sugar test your blood sugar when fasting (before breakfast) and 2 hours after a meal. A few times a week should be enough to get a feel for what your blood sugar is doing.  Accu-chek Guide is an easy glucometer to use.  Expected Outcomes:  Demonstrated interest in learning. Expect positive outcomes  Education material provided: A1C conversion sheet, My Plate and Snack sheet  If problems or questions, patient to contact team via:  Phone and MyChart  Future DSME appointment: 4-6 wks

## 2017-06-20 NOTE — Patient Instructions (Signed)
   Great job on staying active! Keep up your frequent walks outside  Aim to eat balanced meals and snacks, eating protein whenever you eat carbohydrates.  Consider having 3-4 carb servings or 45-60 grams per meal  Consider having 0-1 carb servings or 0-15 grams per snack  As you are experimenting with including ice cream or other sweet foods in your diet, aim to start with 15 g or less carbohydrate and always include a protein. Check your blood sugar 1-2 hours after you eat to monitor the effect of the food on your blood sugar.  To know if you are controlling your blood sugar test your blood sugar when fasting (before breakfast) and 2 hours after a meal. A few times a week should be enough to get a feel for what your blood sugar is doing.  Accu-chek Guide is an easy glucometer to use.

## 2017-07-24 ENCOUNTER — Other Ambulatory Visit: Payer: Self-pay | Admitting: Neurology

## 2017-07-24 ENCOUNTER — Encounter: Payer: Medicare Other | Attending: Family Medicine | Admitting: Registered"

## 2017-07-24 DIAGNOSIS — I10 Essential (primary) hypertension: Secondary | ICD-10-CM | POA: Diagnosis not present

## 2017-07-24 DIAGNOSIS — E669 Obesity, unspecified: Secondary | ICD-10-CM | POA: Insufficient documentation

## 2017-07-24 DIAGNOSIS — E7800 Pure hypercholesterolemia, unspecified: Secondary | ICD-10-CM | POA: Diagnosis present

## 2017-07-24 DIAGNOSIS — E119 Type 2 diabetes mellitus without complications: Secondary | ICD-10-CM | POA: Diagnosis not present
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
## 2017-07-24 NOTE — Patient Instructions (Signed)
You can lighten up on your food restrictions and check your blood sugar after meals and snacks to see their effect on your blood sugar. Follow the 'Rule of 15' if you experience low blood sugar. Keep up the good work balancing your meals and snacks with protein Consider making several salads at once to eat through the week Chopped Salad kits are convenient salad options

## 2017-07-25 ENCOUNTER — Ambulatory Visit: Payer: Medicare Other | Admitting: Registered"

## 2017-08-13 DIAGNOSIS — L719 Rosacea, unspecified: Secondary | ICD-10-CM

## 2017-08-13 HISTORY — DX: Rosacea, unspecified: L71.9

## 2017-09-01 ENCOUNTER — Other Ambulatory Visit: Payer: Self-pay | Admitting: General Surgery

## 2017-09-01 DIAGNOSIS — Z800 Family history of malignant neoplasm of digestive organs: Secondary | ICD-10-CM

## 2017-09-03 ENCOUNTER — Encounter: Payer: Self-pay | Admitting: Neurology

## 2017-09-19 ENCOUNTER — Ambulatory Visit
Admission: RE | Admit: 2017-09-19 | Discharge: 2017-09-19 | Disposition: A | Discharge status: Home / Self Care | Payer: Medicare Other | Source: Ambulatory Visit | Attending: General Surgery | Admitting: General Surgery

## 2017-09-19 DIAGNOSIS — Z800 Family history of malignant neoplasm of digestive organs: Secondary | ICD-10-CM

## 2017-09-19 MED ORDER — GADOBENATE DIMEGLUMINE 529 MG/ML IV SOLN
15.0000 mL | Freq: Once | INTRAVENOUS | Status: AC | PRN
Start: 2017-09-19 — End: 2017-09-19
  Administered 2017-09-19: 15 mL via INTRAVENOUS

## 2017-09-27 ENCOUNTER — Other Ambulatory Visit: Payer: Self-pay | Admitting: General Surgery

## 2017-09-27 ENCOUNTER — Ambulatory Visit
Admission: RE | Admit: 2017-09-27 | Discharge: 2017-09-27 | Disposition: A | Discharge status: Home / Self Care | Payer: Medicare Other | Source: Ambulatory Visit | Attending: General Surgery | Admitting: General Surgery

## 2017-09-27 DIAGNOSIS — R0789 Other chest pain: Secondary | ICD-10-CM

## 2017-12-03 ENCOUNTER — Ambulatory Visit: Payer: Medicare Other | Admitting: Neurology

## 2017-12-03 ENCOUNTER — Encounter: Payer: Self-pay | Admitting: Neurology

## 2017-12-03 DIAGNOSIS — E1140 Type 2 diabetes mellitus with diabetic neuropathy, unspecified: Secondary | ICD-10-CM

## 2017-12-03 DIAGNOSIS — E1142 Type 2 diabetes mellitus with diabetic polyneuropathy: Secondary | ICD-10-CM

## 2017-12-03 HISTORY — DX: Type 2 diabetes mellitus with diabetic neuropathy, unspecified: E11.40

## 2017-12-03 MED ORDER — GABAPENTIN 100 MG PO CAPS
100.0000 mg | ORAL_CAPSULE | Freq: Three times a day (TID) | ORAL | 1 refills | Status: DC | PRN
Start: 2017-12-03 — End: 2019-06-02

## 2017-12-03 NOTE — Progress Notes (Signed)
Reason for visit: Peripheral neuropathy  Yvonne [name] is an 72 y.o. female  History of present illness:  Yvonne [name] is a 72 year old right-handed white female with a history of diet-controlled diabetes and a diabetic neuropathy.  The patient indicates that in the summer 2018 her hemoglobin A1c went up to 7.9, she lost 18 pounds of weight and brought her level down to 6.3.  The patient is not on any medications for diabetes.  She continues to have some symptoms from her neuropathy with a cold sensation in the feet.  She takes gabapentin 300 mg at night, she takes no medication during the daytime.  She usually sleeps fairly well, but sometimes she will wake up in the middle the night with cold sensations in the feet.  She uses a topical cream, she wears socks, she warms the bed before she gets in it.  She has bilateral knee arthritis requiring shots on occasion.  She reports that her balance is good, she has not had any falls.  She is not bothered as much during the day with her feet as she is at night.  She denies any symptoms with the hands.  She returns to this office for an evaluation.  Past Medical History:  Diagnosis Date  . Anxiety   . Chest pressure   . Degenerative arthritis   . Diabetes mellitus without complication (HCC)    diet controlled  . Diabetic neuropathy ([place]) 12/03/2017  . Heart palpitations   . History of diastolic dysfunction    per echo in 2011  . Hyperlipidemia   . Hypertension   . Hypothyroidism   . LVH (left ventricular hypertrophy)    per echo in 2011  . Normal nuclear stress test 2011  . Polyneuropathy in other diseases classified elsewhere ([place]) 05/21/2013  . Sleep difficulties   . Tinnitus     Past Surgical History:  Procedure Laterality Date  . APPENDECTOMY    . CARDIOVASCULAR STRESS TEST  11/09/2009   EF 75%  . CARPAL TUNNEL RELEASE     bilateral  . TRANSTHORACIC ECHOCARDIOGRAM  11/01/2009   NORMAL LV FILLING AND DIASTOLIC DYSFUNCTION AND  MILD AORTIC SCLEROSIS AND TRACE MITRAL REGURGITATION    Family History  Problem Relation Age of Onset  . Hypertension Mother   . Heart disease Mother   . Dementia Mother   . Heart disease Father   . Arthritis Father   . Diabetes Father   . Hypertension Father   . Cancer Brother 90       pancreatic  . Other Brother        automobile accident  . Stroke Maternal Grandmother   . Heart disease Maternal Grandfather   . Cancer Paternal Aunt        malignant brain cancer  . Cancer Other 49       pancreatic neuroendocrine carcinoma  . Cancer Cousin 64       pancreatic  . Cancer Cousin 60       pancreatic  . Cancer Cousin 52       breast  . Cancer Cousin 67       uterine  . Cancer Cousin 75       unknown primary  . Colon cancer Neg Hx     Social history:  reports that she quit smoking about 31 years ago. She has never used smokeless tobacco. She reports that she drinks alcohol. She reports that she does not use drugs.    Allergies  Allergen Reactions  . Tobramycin Hives  . Sulfur Nausea Only  . Erythromycin Nausea Only    Medications:  Prior to Admission medications   Medication Sig Start Date End Date Taking? Authorizing Provider  ALPRAZolam (XANAX) 0.25 MG tablet Take 0.25 mg by mouth 3 (three) times daily as needed for anxiety.   Yes [provider]  aspirin 81 MG tablet Take 81 mg by mouth daily.     Yes [provider]  cholecalciferol (VITAMIN D) 1000 UNITS tablet Take 2,000 Units daily by mouth.    Yes [provider]  FLUTICASONE PROPIONATE EX Apply topically as needed.   Yes [provider]  furosemide (LASIX) 20 MG tablet Take 10 mg by mouth daily.   Yes [provider]  gabapentin (NEURONTIN) 100 MG capsule Take 1 capsule (100 mg total) by mouth 3 (three) times daily as needed. 12/03/17  Yes Kathrynn Ducking, MD  gabapentin (NEURONTIN) 300 MG capsule Take 300 mg by mouth daily.   Yes Kathyrn Lass, MD  levothyroxine  (SYNTHROID) 125 MCG tablet TAKE 1 TABLET BY MOUTH DAILY EXCEPT SUNDAYS 09/12/15  Yes Darlin Coco, MD  metoprolol succinate (TOPROL-XL) 25 MG 24 hr tablet TAKE 1/2 TABLET BY MOUTH DAILY. 11/26/14  Yes Darlin Coco, MD  metroNIDAZOLE (METROCREAM) 0.75 % cream Apply 1 application topically daily. (ROSACEA) 12/21/14  Yes [provider]  omeprazole (PRILOSEC) 10 MG capsule Take 10 mg by mouth as needed (indigestion).    Yes [provider]  potassium chloride SA (K-DUR,KLOR-CON) 20 MEQ tablet TAKE 1/2 TABLET BY MOUTH DAILY. 09/30/15  Yes Darlin Coco, MD  Prenatal Vit-Fe Fumarate-FA (MULTIVITAMIN-PRENATAL) 27-0.8 MG TABS Take 1 tablet by mouth daily.   Yes [provider]  rosuvastatin (CRESTOR) 5 MG tablet Take 1 tablet (5 mg total) by mouth daily. 07/04/15  Yes Darlin Coco, MD    ROS:  Out of a complete 14 system review of symptoms, the patient complains only of the following symptoms, and all other reviewed systems are negative.  Ringing in the ears Cold sensations in the feet  Blood pressure 136/67, pulse 81, height 5\' 7"  (1.702 m), weight 154 lb (69.9 kg).  Physical Exam  General: The patient is alert and cooperative at the time of the examination.  Skin: No significant peripheral edema is noted.   Neurologic Exam  Mental status: The patient is alert and oriented x 3 at the time of the examination. The patient has apparent normal recent and remote memory, with an apparently normal attention span and concentration ability.   Cranial nerves: Facial symmetry is present. Speech is normal, no aphasia or dysarthria is noted. Extraocular movements are full. Visual fields are full.  Motor: The patient has good strength in all 4 extremities.  Sensory examination: Soft touch sensation is symmetric on the face, arms, and legs.  Coordination: The patient has good finger-nose-finger and heel-to-shin bilaterally.  Gait and station: The patient has  a normal gait. Tandem gait is normal. Romberg is negative. No drift is seen.  Reflexes: Deep tendon reflexes are symmetric.   Assessment/Plan:  1.  Diabetic peripheral neuropathy  The patient may use the 100 mg gabapentin capsules to increase the dose at nighttime if needed or even use during the day.  The patient was given a prescription for this.  She is considering going to a chiropractor for laser therapy for peripheral neuropathy or consider Anodyne therapy.  The patient will follow-up through this office in 1 year.  C.  Floyde Parkins MD 12/03/2017 8:22 AM  [facility] [address] [place], [place] [address]  Phone [phone] Fax [phone]

## 2017-12-04 DIAGNOSIS — M179 Osteoarthritis of knee, unspecified: Secondary | ICD-10-CM | POA: Insufficient documentation

## 2017-12-04 DIAGNOSIS — M1710 Unilateral primary osteoarthritis, unspecified knee: Secondary | ICD-10-CM | POA: Insufficient documentation

## 2017-12-06 ENCOUNTER — Ambulatory Visit: Payer: Medicare Other | Admitting: Neurology

## 2018-07-15 ENCOUNTER — Telehealth: Payer: Self-pay | Admitting: Genetic Counselor

## 2018-07-15 NOTE — Telephone Encounter (Signed)
LM with husband that I called and left cB instructions.

## 2018-07-17 NOTE — Telephone Encounter (Signed)
Explained that we will refer her to Dr. Burr Medico for the high risk pancreatic cancer screening program.  She should expect a call to schedule.

## 2018-08-05 ENCOUNTER — Encounter: Payer: Self-pay | Admitting: Hematology

## 2018-08-05 ENCOUNTER — Telehealth: Payer: Self-pay | Admitting: Hematology

## 2018-08-05 NOTE — Telephone Encounter (Signed)
Pt has been cld and scheduled for the high risk clinic to see Dr. Burr Medico on 2/4 at 230pm. Pt has agreed to the appt date and time. Letter mailed.

## 2018-09-15 NOTE — Progress Notes (Signed)
[place]   Telephone:([phone]) [phone] Fax:([phone]) [place] Note   Patient Care Team: Kathyrn Lass, MD as PCP - General (Family Medicine) Garrel Ridgel, [place] as Consulting Physician (Podiatry) Darlin Coco, MD as Consulting Physician (Cardiology) 09/16/2018  Referring Provider  Roma Kayser , Genetic Counselor    CHIEF COMPLAINTS/PURPOSE OF CONSULTATION:  Family history of pancreatic cancer   HISTORY OF PRESENTING ILLNESS:  Yvonne [name] 73 y.o. female is here because of her strong family history of malignancy, especially pancreatic cancer.  She was referred by our genetic counselor, to discuss pancreatic cancer screening.    She has no personal history of malignancy.  Her brother died of pancreatic cancer at age 28. Her 1st cousin and his sister both died of pancreatic cancer. A nephew diagnosed with PNET, and pancreatic cancer and died from it, was very aggressive. Her maternal aunt had brain cancer. The two pancreatic cancers are from her maternal side and they were siblings. She has 3 siblings but only her brother had pancreatic cancer cancer and died at 104. She only has 1 first degree relative with pancreatic cancer. But none of her parents had cancer. She has only have 1 relative with breast cancer.   In 2018, a genetic test showed she was negative for mutation. Today, she is here alone. She is here to discuss prevention of pancreatic cancer and screening. She due for a colonoscopy this year around March. She is doing well but she describes back pain, hip pain, and knee pain and bumps on her stomach. She denies any stomach pain, N/V/D, lower leg edema but she does have constant increasing gas. She has normal BMs.  She has diabetes but she currently maintians a healthy diet and has loss weight. She has her diabetes controlled and is improving. She had a high BP today but most of the time it is within normal limits. She arthritis, neuropathy and her  feet are always cold but it is not painful. She had a appendectomy, hysterectomy etc.   She occasionally drinks alcohol and quit smoking in 1977. She smoked for about 10 years. She is retired but worked at a [employer]. She is very active and has grandchild that she keeps up with.   Dr. Barry Dienes reviewed her pancreatic screening and referred her to GI Dr.Jacobs last year.  She did have screening MRI for pancreatic cancer last year which was negative, except a benign pancreatic cyst.  Pertinent positives and negatives of review of systems are listed and detailed within the above HPI.  MEDICAL HISTORY:  Past Medical History:  Diagnosis Date  . Anxiety   . Chest pressure   . Degenerative arthritis   . Diabetes mellitus without complication (HCC)    diet controlled  . Diabetic neuropathy ([place]) 12/03/2017  . Heart palpitations   . History of diastolic dysfunction    per echo in 2011  . Hyperlipidemia   . Hypertension   . Hypothyroidism   . LVH (left ventricular hypertrophy)    per echo in 2011  . Normal nuclear stress test 2011  . Polyneuropathy in other diseases classified elsewhere ([place]) 05/21/2013  . Sleep difficulties   . Tinnitus     SURGICAL HISTORY: Past Surgical History:  Procedure Laterality Date  . ABDOMINAL HYSTERECTOMY    . APPENDECTOMY    . CARDIOVASCULAR STRESS TEST  11/09/2009   EF 75%  . CARPAL TUNNEL RELEASE     bilateral  .  TRANSTHORACIC ECHOCARDIOGRAM  11/01/2009   NORMAL LV FILLING AND DIASTOLIC DYSFUNCTION AND MILD AORTIC SCLEROSIS AND TRACE MITRAL REGURGITATION    SOCIAL HISTORY: Social History   Socioeconomic History  . Marital status: Married    Spouse name: Edd Fabian  . Number of children: 1  . Years of education: COLLEGE  . Highest education level: Not on file  Occupational History  . Occupation: Retired  Scientific laboratory technician  . Financial resource strain: Not on file  . Food insecurity:    Worry: Not on file    Inability: Not on  file  . Transportation needs:    Medical: Not on file    Non-medical: Not on file  Tobacco Use  . Smoking status: Former Smoker    Packs/day: 0.50    Years: 10.00    Pack years: 5.00    Last attempt to quit: 08/14/1975    Years since quitting: 43.1  . Smokeless tobacco: Never Used  Substance and Sexual Activity  . Alcohol use: Yes    Comment: one glass of wine a week  . Drug use: No  . Sexual activity: Not on file  Lifestyle  . Physical activity:    Days per week: Not on file    Minutes per session: Not on file  . Stress: Not on file  Relationships  . Social connections:    Talks on phone: Not on file    Gets together: Not on file    Attends religious service: Not on file    Active member of club or organization: Not on file    Attends meetings of clubs or organizations: Not on file    Relationship status: Not on file  . Intimate partner violence:    Fear of current or ex partner: Not on file    Emotionally abused: Not on file    Physically abused: Not on file    Forced sexual activity: Not on file  Other Topics Concern  . Not on file  Social History Narrative   Lives at home w/ her husband, Edd Fabian.   Right-handed.   Drinks no caffeine but does eat chocolate occasionally.   epworth sleepiness scale = 3 (01/10/16)    FAMILY HISTORY: Family History  Problem Relation Age of Onset  . Hypertension Mother   . Heart disease Mother   . Dementia Mother   . Heart disease Father   . Arthritis Father   . Diabetes Father   . Hypertension Father   . Cancer Brother 22       pancreatic  . Other Brother        automobile accident  . Stroke Maternal Grandmother   . Heart disease Maternal Grandfather   . Cancer Paternal Aunt        malignant brain cancer  . Cancer Other 49       pancreatic neuroendocrine carcinoma  . Cancer Cousin 64       pancreatic  . Cancer Cousin 60       pancreatic  . Cancer Cousin 52       breast  . Cancer Cousin 67       uterine  . Cancer  Cousin 75       unknown primary  . Colon cancer Neg Hx     ALLERGIES:  is allergic to tobramycin; sulfur; and erythromycin.  MEDICATIONS:  Current Outpatient Medications  Medication Sig Dispense Refill  . ALPRAZolam (XANAX) 0.25 MG tablet Take 0.25 mg by mouth 3 (three) times daily as  needed for anxiety.    Marland Kitchen aspirin 81 MG tablet Take 81 mg by mouth daily.      . cholecalciferol (VITAMIN D) 1000 UNITS tablet Take 2,000 Units daily by mouth.     Marland Kitchen FLUTICASONE PROPIONATE EX Apply topically as needed.    . furosemide (LASIX) 20 MG tablet Take 10 mg by mouth daily.    Marland Kitchen gabapentin (NEURONTIN) 100 MG capsule Take 1 capsule (100 mg total) by mouth 3 (three) times daily as needed. 270 capsule 1  . gabapentin (NEURONTIN) 300 MG capsule Take 300 mg by mouth daily.    Marland Kitchen levothyroxine (SYNTHROID) 125 MCG tablet TAKE 1 TABLET BY MOUTH DAILY EXCEPT SUNDAYS (Patient taking differently: Take 100 mcg by mouth. ) 30 tablet 0  . metoprolol succinate (TOPROL-XL) 25 MG 24 hr tablet TAKE 1/2 TABLET BY MOUTH DAILY. 45 tablet 3  . metroNIDAZOLE (METROCREAM) 0.75 % cream Apply 1 application topically daily. (ROSACEA)    . omeprazole (PRILOSEC) 10 MG capsule Take 10 mg by mouth as needed (indigestion).     . potassium chloride SA (K-DUR,KLOR-CON) 20 MEQ tablet TAKE 1/2 TABLET BY MOUTH DAILY. 45 tablet 3  . Prenatal Vit-Fe Fumarate-FA (MULTIVITAMIN-PRENATAL) 27-0.8 MG TABS Take 1 tablet by mouth daily.    . rosuvastatin (CRESTOR) 5 MG tablet Take 1 tablet (5 mg total) by mouth daily. 90 tablet 3   No current facility-administered medications for this visit.     REVIEW OF SYSTEMS:   Constitutional: Denies fevers, chills or abnormal night sweats Eyes: Denies blurriness of vision, double vision or watery eyes Ears, nose, mouth, throat, and face: Denies mucositis or sore throat Respiratory: Denies cough, dyspnea or wheezes Cardiovascular: Denies palpitation, chest discomfort or lower extremity  swelling Gastrointestinal:  Denies nausea, heartburn or change in bowel habits, (+) frequent gas  Skin: Denies abnormal skin rashes Lymphatics: Denies new lymphadenopathy or easy bruising Neurological:Denies numbness, tingling or new weaknesses Behavioral/Psych: Mood is stable, no new changes  MSK: (+) back pain All other systems were reviewed with the patient and are negative.  PHYSICAL EXAMINATION: ECOG PERFORMANCE STATUS: 0 - Asymptomatic  Vitals:   09/16/18 1442  BP: (!) 153/80  Pulse: 72  Resp: 17  Temp: 98.5 F (36.9 C)  SpO2: 99%   Filed Weights   09/16/18 1442  Weight: 156 lb 1.6 oz (70.8 kg)    GENERAL:alert, no distress and comfortable SKIN: skin color, texture, turgor are normal, no rashes or significant lesions EYES: normal, conjunctiva are pink and non-injected, sclera clear OROPHARYNX:no exudate, no erythema and lips, buccal mucosa, and tongue normal  NECK: supple, thyroid normal size, non-tender, without nodularity LYMPH:  no palpable lymphadenopathy in the cervical, axillary or inguinal LUNGS: clear to auscultation and percussion with normal breathing effort HEART: regular rate & rhythm and no murmurs and no lower extremity edema ABDOMEN:abdomen soft, non-tender and normal bowel sounds Musculoskeletal:no cyanosis of digits and no clubbing , (+) intermittent mild right flank  PSYCH: alert & oriented x 3 with fluent speech NEURO: no focal motor/sensory deficits  LABORATORY DATA:  I have reviewed the data as listed CBC Latest Ref Rng & Units 10/08/2011 08/24/2011  WBC 4.5 - 10.5 K/uL 5.8 5.7  Hemoglobin 12.0 - 15.0 g/dL 15.0 13.9  Hematocrit 36.0 - 46.0 % 43.8 40.2  Platelets 150.0 - 400.0 K/uL 246.0 263.0   CMP Latest Ref Rng & Units 01/04/2016 09/06/2015 03/22/2015  Glucose 65 - 99 mg/dL 136(H) 120(H) 115(H)  BUN 7 - 25 mg/dL 12  13 10  Creatinine 0.50 - 0.99 mg/dL 0.71 0.66 0.75  Sodium 135 - 146 mmol/L 138 137 139  Potassium 3.5 - 5.3 mmol/L 4.1 4.3 4.0   Chloride 98 - 110 mmol/L 103 103 104  CO2 20 - 31 mmol/L _0 Calcium 8.6 - 10.4 mg/dL 9.1 9.5 9.4  Total Protein 6.1 - 8.1 g/dL 6.7 7.1 7.0  Total Bilirubin 0.2 - 1.2 mg/dL 0.7 0.6 0.6  Alkaline Phos 33 - 130 U/L 47 44 45  AST 10 - 35 U/L _1 ALT 6 - 29 U/L _2 RADIOGRAPHIC STUDIES: I have personally reviewed the radiological images as listed and agreed with the findings in the report. No results found.   09/27/2017 DG Ribs Unilateral W/Chest Left  IMPRESSION: 1. No focal bone abnormality identified. 2. The fiducial marker indicates that the area of concern is in the left breast. Correlation with mammographic imaging advised.  09/19/2017 MR PELVIS W WO CONTRAST   09/19/2017 MR ABDOMEN MRCP W WO CONTAST   IMPRESSION: 1. The pancreatic tail cystic lesion measures 1.1 by 0.5 by 0.8 cm, and by my measurement was previously 0.9 by 0.5 by 0.8 cm. This does not qualify as interval growth and currently the lesion has no worrisome characteristics. We have now demonstrated 1 year of stability. Based on the size of the lesion and the patient's age at presentation, follow up pancreatic protocol MRI is recommended in 2 years time. This recommendation follows ACR consensus guidelines: Management of Incidental Pancreatic Cysts: A White Paper of the ACR Incidental Findings Committee. J Am Coll Radiol 9357;01:779-390. 2. Stable scarring of the right kidney with calyceal diverticulum noted. 3. No current findings of pelvic malignancy.     ASSESSMENT & PLAN:  Yvonne [name] is a 73 y.o. female with history of  1. Strong family history of pancreatic cancer, negative genetic testing -She has a very strong family history of malignancy, especially pancreatic cancer.  Her brother died of pancreatic cancer at age of 34, and 2 of her maternal cousins had breast cancer in their 21s.  She also has family history of breast cancer, uterine cancer, brain tumor and pancreatic  neuroendocrine tumor.  -Her extensive genetic testing, including BRCA1, BRCA2, and Lynch syndrome, was negative, we reviewed. -She is certainly at high risk for pancreatic cancer, due to her strong family history.  Although she does not meet the criteria for familial pancreatic cancer (at least two first degree relatives), but she had 3 relatives with pancreatic cancer and her brother died of pancreatic cancer at very young age, concerning for inheritable cancer syndrome in her family, mainly on maternal side.  -We reviewed pancreatic cancer screening, which is still controversial. But most of the pancreatic cancer screening studies did include persons with multiple (>=2) first and second degree relatives with pancreatic cancer, and data support pancreatic cancer screening in high risk individuals.   -We discussed the aggressive nature of pancreatic cancer, and it can certainly still happen in between annual screening. -According to NCCN guideline, she is considered high risk individual and qualify for pancreatic cancer screening, with MRI/MRCP or EUS annually. We discuss these screening tools in detail.  -She is planning to have screening colonoscopy next month with GI Dr. Ardis Hughs, I suggested her to have EUS at the same time under anesthesia. -In the future, MRI/MRCP is preferred due to the non-invasive nature  -We also discussed other screening 12, such as tumor marker  CA 19.9, lab and physical exam.  We discussed pancreatic cancer related symptoms, and she knows what to watch. -she would like to f/u with [name] for pancreatic cancer screening in the future.   2. Diabetic polyneuropathy associated with type 2 diabetes mellitus  - On 100 mg gabapentin  -We will check a B12 level for her today   Plan  -Due to her high risk for pancreatic cancer, I recommend annual screening with abdominal MRI/MRCP or EUS -I will speak with Ardis Hughs about EUS next month along with her screening colonoscopy  -lab today  including CBC, CMP, CA19.9, B12, methylmalonic acid level - I will call her about her lab results  - F/u in a year with abdominal MRI/MRCP few days before  No orders of the defined types were placed in this encounter.   All questions were answered. The patient knows to call the clinic with any problems, questions or concerns. I spent 40 minutes counseling the patient face to face. The total time spent in the appointment was 50 minutes and more than 50% was on counseling.  I, Manson Allan am acting as scribe for Dr. Truitt Merle.  I have reviewed the above documentation for accuracy and completeness, and I agree with the above.      Truitt Merle, MD 09/16/2018 3:35 PM

## 2018-09-16 ENCOUNTER — Encounter: Payer: Self-pay | Admitting: Hematology

## 2018-09-16 ENCOUNTER — Telehealth: Payer: Self-pay | Admitting: Hematology

## 2018-09-16 ENCOUNTER — Inpatient Hospital Stay: Payer: Medicare Other | Attending: Hematology | Admitting: Hematology

## 2018-09-16 VITALS — BP 153/80 | HR 72 | Temp 98.5°F | Resp 17 | Ht 67.0 in | Wt 156.1 lb

## 2018-09-16 DIAGNOSIS — R002 Palpitations: Secondary | ICD-10-CM | POA: Insufficient documentation

## 2018-09-16 DIAGNOSIS — Z7982 Long term (current) use of aspirin: Secondary | ICD-10-CM | POA: Diagnosis not present

## 2018-09-16 DIAGNOSIS — G629 Polyneuropathy, unspecified: Secondary | ICD-10-CM

## 2018-09-16 DIAGNOSIS — Z79899 Other long term (current) drug therapy: Secondary | ICD-10-CM | POA: Insufficient documentation

## 2018-09-16 DIAGNOSIS — I10 Essential (primary) hypertension: Secondary | ICD-10-CM | POA: Diagnosis not present

## 2018-09-16 DIAGNOSIS — E1142 Type 2 diabetes mellitus with diabetic polyneuropathy: Secondary | ICD-10-CM

## 2018-09-16 DIAGNOSIS — M25569 Pain in unspecified knee: Secondary | ICD-10-CM | POA: Insufficient documentation

## 2018-09-16 DIAGNOSIS — M549 Dorsalgia, unspecified: Secondary | ICD-10-CM | POA: Diagnosis not present

## 2018-09-16 DIAGNOSIS — M25559 Pain in unspecified hip: Secondary | ICD-10-CM | POA: Diagnosis not present

## 2018-09-16 DIAGNOSIS — E039 Hypothyroidism, unspecified: Secondary | ICD-10-CM | POA: Insufficient documentation

## 2018-09-16 DIAGNOSIS — E785 Hyperlipidemia, unspecified: Secondary | ICD-10-CM

## 2018-09-16 DIAGNOSIS — K862 Cyst of pancreas: Secondary | ICD-10-CM | POA: Diagnosis not present

## 2018-09-16 DIAGNOSIS — Z803 Family history of malignant neoplasm of breast: Secondary | ICD-10-CM | POA: Diagnosis not present

## 2018-09-16 DIAGNOSIS — F419 Anxiety disorder, unspecified: Secondary | ICD-10-CM | POA: Insufficient documentation

## 2018-09-16 DIAGNOSIS — Z800 Family history of malignant neoplasm of digestive organs: Secondary | ICD-10-CM

## 2018-09-16 DIAGNOSIS — Z87891 Personal history of nicotine dependence: Secondary | ICD-10-CM | POA: Diagnosis not present

## 2018-09-16 NOTE — Telephone Encounter (Signed)
Gave patient avs report and appointments for February 2020 and February 2021.

## 2018-09-17 ENCOUNTER — Encounter: Payer: Self-pay | Admitting: Hematology

## 2018-09-19 ENCOUNTER — Other Ambulatory Visit: Payer: Self-pay

## 2018-09-19 ENCOUNTER — Telehealth: Payer: Self-pay

## 2018-09-19 ENCOUNTER — Inpatient Hospital Stay: Payer: Medicare Other

## 2018-09-19 DIAGNOSIS — Z800 Family history of malignant neoplasm of digestive organs: Secondary | ICD-10-CM | POA: Diagnosis not present

## 2018-09-19 DIAGNOSIS — G629 Polyneuropathy, unspecified: Secondary | ICD-10-CM

## 2018-09-19 DIAGNOSIS — Z1211 Encounter for screening for malignant neoplasm of colon: Secondary | ICD-10-CM

## 2018-09-19 LAB — CBC WITH DIFFERENTIAL (CANCER CENTER ONLY)
ABS IMMATURE GRANULOCYTES: 0.01 10*3/uL (ref 0.00–0.07)
BASOS PCT: 1 %
Basophils Absolute: 0.1 10*3/uL (ref 0.0–0.1)
EOS ABS: 0.3 10*3/uL (ref 0.0–0.5)
Eosinophils Relative: 6 %
HCT: 41.3 % (ref 36.0–46.0)
Hemoglobin: 14 g/dL (ref 12.0–15.0)
IMMATURE GRANULOCYTES: 0 %
Lymphocytes Relative: 25 %
Lymphs Abs: 1.5 10*3/uL (ref 0.7–4.0)
MCH: 28 pg (ref 26.0–34.0)
MCHC: 33.9 g/dL (ref 30.0–36.0)
MCV: 82.6 fL (ref 80.0–100.0)
MONOS PCT: 9 %
Monocytes Absolute: 0.6 10*3/uL (ref 0.1–1.0)
NEUTROS ABS: 3.6 10*3/uL (ref 1.7–7.7)
NEUTROS PCT: 59 %
NRBC: 0 % (ref 0.0–0.2)
Platelet Count: 181 10*3/uL (ref 150–400)
RBC: 5 MIL/uL (ref 3.87–5.11)
RDW: 13.2 % (ref 11.5–15.5)
WBC Count: 6.1 10*3/uL (ref 4.0–10.5)

## 2018-09-19 LAB — CMP (CANCER CENTER ONLY)
ALBUMIN: 4.3 g/dL (ref 3.5–5.0)
ALT: 24 U/L (ref 0–44)
AST: 18 U/L (ref 15–41)
Alkaline Phosphatase: 59 U/L (ref 38–126)
Anion gap: 9 (ref 5–15)
BUN: 12 mg/dL (ref 8–23)
CALCIUM: 9.4 mg/dL (ref 8.9–10.3)
CO2: 27 mmol/L (ref 22–32)
CREATININE: 0.74 mg/dL (ref 0.44–1.00)
Chloride: 105 mmol/L (ref 98–111)
GFR, Estimated: >60 mL/min (ref >60)
Glucose, Bld: 130 mg/dL — ABNORMAL HIGH (ref 70–99)
Potassium: 4 mmol/L (ref 3.5–5.1)
Sodium: 141 mmol/L (ref 135–145)
Total Bilirubin: 0.8 mg/dL (ref 0.3–1.2)
Total Protein: 7.3 g/dL (ref 6.5–8.1)

## 2018-09-19 LAB — VITAMIN B12: VITAMIN B 12: 546 pg/mL (ref 180–914)

## 2018-09-19 MED ORDER — PEG 3350-KCL-NA BICARB-NACL 420 G PO SOLR: 4000.0000 mL | Freq: Once | ORAL | 0 refills | Status: AC

## 2018-09-19 NOTE — Telephone Encounter (Signed)
Yvonne [name], She is due for routine risk colon cancer screening with colonoscopy next month.  She has a FH of pancreatic cancer and is also interested in EUS for pancreatic screening.  Can you arrange for upper EUS and colonoscopy same day at [facility] sometime in March.  Thanks

## 2018-09-19 NOTE — Progress Notes (Signed)
Krista Blue, Thanks. We'll get in touch with her.   Patty, She is due for routine risk colon cancer screening with colonoscopy next month.  She has a FH of pancreatic cancer and is also interested in EUS for pancreatic screening.  Can you arrange for upper EUS and colonoscopy same day at [facility] sometime in March.  Thanks

## 2018-09-19 NOTE — Telephone Encounter (Signed)
-----   Message from [name], MD sent at 09/19/2018  5:50 AM EST -----   ----- Message ----- From: Truitt Merle, MD Sent: 09/17/2018  10:25 PM EST To: [name], MD  Dan,  This is the pt I spoke with today, she wants to have screening colonoscopy with you in March, and you can consider EUS for pancreatic cancer screening  Thanks  Truitt Merle

## 2018-09-19 NOTE — Telephone Encounter (Signed)
EUS Colon scheduled, pt instructed and medications reviewed.  Patient instructions mailed to home.  Patient to call with any questions or concerns.  ? ?

## 2018-09-20 LAB — CANCER ANTIGEN 19-9: CA 19-9: 7 U/mL (ref 0–35)

## 2018-09-23 LAB — METHYLMALONIC ACID, SERUM: METHYLMALONIC ACID, QUANTITATIVE: 139 nmol/L (ref 0–378)

## 2018-09-24 ENCOUNTER — Telehealth: Payer: Self-pay

## 2018-09-24 NOTE — Telephone Encounter (Signed)
Per patient's request mailed copy of recent lab results to patient's home.

## 2018-10-10 ENCOUNTER — Telehealth: Payer: Self-pay | Admitting: Gastroenterology

## 2018-10-10 NOTE — Telephone Encounter (Signed)
Pt has questions regarding procedure. Pls call her.

## 2018-10-10 NOTE — Telephone Encounter (Signed)
The pt had questions regarding her prep and what to do if she has nausea with go lytely.  She was advised that if she has problems when prepping she should call the office number and the on call MD can help her.  She was also concerned about her fingernail polish.  She was advised that as long as it is not dark she should be fine.  She states she will take the polish off of 1 finger before the case.  She will call back with any concerns.

## 2018-10-20 ENCOUNTER — Encounter (HOSPITAL_COMMUNITY): Payer: Self-pay | Admitting: *Deleted

## 2018-10-23 ENCOUNTER — Other Ambulatory Visit: Payer: Self-pay

## 2018-10-23 ENCOUNTER — Encounter (HOSPITAL_COMMUNITY): Payer: Self-pay | Admitting: Gastroenterology

## 2018-10-23 ENCOUNTER — Encounter (HOSPITAL_COMMUNITY)
Admission: RE | Disposition: A | Discharge status: Home / Self Care | Payer: Self-pay | Source: Home / Self Care | Attending: Gastroenterology

## 2018-10-23 ENCOUNTER — Ambulatory Visit (HOSPITAL_COMMUNITY): Payer: Medicare Other | Admitting: Anesthesiology

## 2018-10-23 ENCOUNTER — Ambulatory Visit (HOSPITAL_COMMUNITY)
Admission: RE | Admit: 2018-10-23 | Discharge: 2018-10-23 | Disposition: A | Discharge status: Home / Self Care | Payer: Medicare Other | Attending: Gastroenterology | Admitting: Gastroenterology

## 2018-10-23 DIAGNOSIS — R002 Palpitations: Secondary | ICD-10-CM | POA: Diagnosis not present

## 2018-10-23 DIAGNOSIS — Z808 Family history of malignant neoplasm of other organs or systems: Secondary | ICD-10-CM | POA: Diagnosis not present

## 2018-10-23 DIAGNOSIS — K5730 Diverticulosis of large intestine without perforation or abscess without bleeding: Secondary | ICD-10-CM

## 2018-10-23 DIAGNOSIS — E785 Hyperlipidemia, unspecified: Secondary | ICD-10-CM | POA: Insufficient documentation

## 2018-10-23 DIAGNOSIS — E039 Hypothyroidism, unspecified: Secondary | ICD-10-CM | POA: Diagnosis not present

## 2018-10-23 DIAGNOSIS — F419 Anxiety disorder, unspecified: Secondary | ICD-10-CM | POA: Diagnosis not present

## 2018-10-23 DIAGNOSIS — I10 Essential (primary) hypertension: Secondary | ICD-10-CM | POA: Insufficient documentation

## 2018-10-23 DIAGNOSIS — Z881 Allergy status to other antibiotic agents status: Secondary | ICD-10-CM | POA: Insufficient documentation

## 2018-10-23 DIAGNOSIS — Z882 Allergy status to sulfonamides status: Secondary | ICD-10-CM | POA: Insufficient documentation

## 2018-10-23 DIAGNOSIS — Z803 Family history of malignant neoplasm of breast: Secondary | ICD-10-CM | POA: Insufficient documentation

## 2018-10-23 DIAGNOSIS — Z87891 Personal history of nicotine dependence: Secondary | ICD-10-CM | POA: Insufficient documentation

## 2018-10-23 DIAGNOSIS — E1140 Type 2 diabetes mellitus with diabetic neuropathy, unspecified: Secondary | ICD-10-CM | POA: Insufficient documentation

## 2018-10-23 DIAGNOSIS — G479 Sleep disorder, unspecified: Secondary | ICD-10-CM | POA: Insufficient documentation

## 2018-10-23 DIAGNOSIS — Z1289 Encounter for screening for malignant neoplasm of other sites: Secondary | ICD-10-CM | POA: Diagnosis present

## 2018-10-23 DIAGNOSIS — H9319 Tinnitus, unspecified ear: Secondary | ICD-10-CM | POA: Diagnosis not present

## 2018-10-23 DIAGNOSIS — Z800 Family history of malignant neoplasm of digestive organs: Secondary | ICD-10-CM

## 2018-10-23 DIAGNOSIS — K862 Cyst of pancreas: Secondary | ICD-10-CM | POA: Diagnosis not present

## 2018-10-23 DIAGNOSIS — Z8049 Family history of malignant neoplasm of other genital organs: Secondary | ICD-10-CM | POA: Insufficient documentation

## 2018-10-23 DIAGNOSIS — Z8261 Family history of arthritis: Secondary | ICD-10-CM | POA: Insufficient documentation

## 2018-10-23 DIAGNOSIS — Z833 Family history of diabetes mellitus: Secondary | ICD-10-CM | POA: Diagnosis not present

## 2018-10-23 DIAGNOSIS — M1990 Unspecified osteoarthritis, unspecified site: Secondary | ICD-10-CM | POA: Diagnosis not present

## 2018-10-23 DIAGNOSIS — Z1211 Encounter for screening for malignant neoplasm of colon: Secondary | ICD-10-CM | POA: Diagnosis not present

## 2018-10-23 DIAGNOSIS — Z809 Family history of malignant neoplasm, unspecified: Secondary | ICD-10-CM | POA: Insufficient documentation

## 2018-10-23 DIAGNOSIS — Z8249 Family history of ischemic heart disease and other diseases of the circulatory system: Secondary | ICD-10-CM | POA: Diagnosis not present

## 2018-10-23 DIAGNOSIS — Z90710 Acquired absence of both cervix and uterus: Secondary | ICD-10-CM | POA: Insufficient documentation

## 2018-10-23 HISTORY — PX: ESOPHAGOGASTRODUODENOSCOPY (EGD) WITH PROPOFOL: SHX5813

## 2018-10-23 HISTORY — PX: COLONOSCOPY WITH PROPOFOL: SHX5780

## 2018-10-23 HISTORY — PX: EUS: SHX5427

## 2018-10-23 SURGERY — UPPER ENDOSCOPIC ULTRASOUND (EUS) RADIAL
Anesthesia: Monitor Anesthesia Care

## 2018-10-23 MED ORDER — SODIUM CHLORIDE 0.9 % IV SOLN: INTRAVENOUS | Status: DC

## 2018-10-23 MED ORDER — LIDOCAINE HCL 1 % IJ SOLN
INTRAMUSCULAR | Status: DC | PRN
  Administered 2018-10-23: 40 mg via INTRADERMAL

## 2018-10-23 MED ORDER — PROPOFOL 500 MG/50ML IV EMUL
INTRAVENOUS | Status: DC | PRN
  Administered 2018-10-23: 150 ug/kg/min via INTRAVENOUS

## 2018-10-23 MED ORDER — PROPOFOL 10 MG/ML IV BOLUS
INTRAVENOUS | Status: AC
  Filled 2018-10-23: qty 40

## 2018-10-23 MED ORDER — PROPOFOL 10 MG/ML IV BOLUS
INTRAVENOUS | Status: DC | PRN
  Administered 2018-10-23 (×2): 10 mg via INTRAVENOUS

## 2018-10-23 MED ORDER — LACTATED RINGERS IV SOLN
INTRAVENOUS | Status: DC
  Administered 2018-10-23: 1000 mL via INTRAVENOUS

## 2018-10-23 SURGICAL SUPPLY — 22 items

## 2018-10-23 NOTE — Transfer of Care (Signed)
Immediate Anesthesia Transfer of Care Note  Patient: Yvonne [name]  Procedure(s) Performed: UPPER ENDOSCOPIC ULTRASOUND (EUS) RADIAL (N/A ) COLONOSCOPY WITH PROPOFOL (N/A )  Patient Location: PACU and Endoscopy Unit  Anesthesia Type:MAC  Level of Consciousness: awake, alert , oriented and patient cooperative  Airway & Oxygen Therapy: Patient Spontanous Breathing and Patient connected to nasal cannula oxygen  Post-op Assessment: Report given to RN and Post -op Vital signs reviewed and stable  Post vital signs: Reviewed and stable  Last Vitals:  Vitals Value Taken Time  BP 126/65 10/23/2018 11:06 AM  Temp 36.5 C 10/23/2018 11:06 AM  Pulse 71 10/23/2018 11:09 AM  Resp 12 10/23/2018 11:09 AM  SpO2 100 % 10/23/2018 11:09 AM  Vitals shown include unvalidated device data.  Last Pain:  Vitals:   10/23/18 1106  TempSrc: Oral  PainSc: 0-No pain         Complications: No apparent anesthesia complications

## 2018-10-23 NOTE — Discharge Instructions (Signed)
Colonoscopy, Adult, Care After °This sheet gives you information about how to care for yourself after your procedure. Your doctor may also give you more specific instructions. If you have problems or questions, call your doctor. °What can I expect after the procedure? °After the procedure, it is common to have: °· A small amount of blood in your poop for 24 hours. °· Some gas. °· Mild cramping or bloating in your belly. °Follow these instructions at home: °General instructions °· For the first 24 hours after the procedure: °? Do not drive or use machinery. °? Do not sign important documents. °? Do not drink alcohol. °? Do your daily activities more slowly than normal. °? Eat foods that are soft and easy to digest. °· Take over-the-counter or prescription medicines only as told by your doctor. °To help cramping and bloating: ° °· Try walking around. °· Put heat on your belly (abdomen) as told by your doctor. Use a heat source that your doctor recommends, such as a moist heat pack or a heating pad. °? Put a towel between your skin and the heat source. °? Leave the heat on for 20-30 minutes. °? Remove the heat if your skin turns bright red. This is especially important if you cannot feel pain, heat, or cold. You can get burned. °Eating and drinking ° °· Drink enough fluid to keep your pee (urine) clear or pale yellow. °· Return to your normal diet as told by your doctor. Avoid heavy or fried foods that are hard to digest. °· Avoid drinking alcohol for as long as told by your doctor. °Contact a doctor if: °· You have blood in your poop (stool) 2-3 days after the procedure. °Get help right away if: °· You have more than a small amount of blood in your poop. °· You see large clumps of tissue (blood clots) in your poop. °· Your belly is swollen. °· You feel sick to your stomach (nauseous). °· You throw up (vomit). °· You have a fever. °· You have belly pain that gets worse, and medicine does not help your  pain. °Summary °· After the procedure, it is common to have a small amount of blood in your poop. You may also have mild cramping and bloating in your belly. °· For the first 24 hours after the procedure, do not drive or use machinery, do not sign important documents, and do not drink alcohol. °· Get help right away if you have a lot of blood in your poop, feel sick to your stomach, have a fever, or have more belly pain. °This information is not intended to replace advice given to you by your health care provider. Make sure you discuss any questions you have with your health care provider. °Document Released: 09/01/2010 Document Revised: 05/30/2017 Document Reviewed: 04/23/2016 °Elsevier Interactive Patient Education © 2019 [employer] ° °

## 2018-10-23 NOTE — Anesthesia Preprocedure Evaluation (Signed)
Anesthesia Evaluation  Patient identified by MRN, date of birth, ID band Patient awake    Reviewed: Allergy & Precautions, NPO status , Patient's Chart, lab work & pertinent test results  Airway Mallampati: II  TM Distance: >3 FB Neck ROM: Full    Dental no notable dental hx.    Pulmonary neg pulmonary ROS, former smoker,    Pulmonary exam normal breath sounds clear to auscultation       Cardiovascular hypertension, Normal cardiovascular exam Rhythm:Regular Rate:Normal     Neuro/Psych negative neurological ROS  negative psych ROS   GI/Hepatic negative GI ROS, Neg liver ROS,   Endo/Other  diabetesHypothyroidism   Renal/GU negative Renal ROS  negative genitourinary   Musculoskeletal negative musculoskeletal ROS (+)   Abdominal   Peds negative pediatric ROS (+)  Hematology negative hematology ROS (+)   Anesthesia Other Findings   Reproductive/Obstetrics negative OB ROS                             Anesthesia Physical Anesthesia Plan  ASA: II  Anesthesia Plan: MAC   Post-op Pain Management:    Induction: Intravenous  PONV Risk Score and Plan:   Airway Management Planned: Simple Face Mask  Additional Equipment:   Intra-op Plan:   Post-operative Plan:   Informed Consent: I have reviewed the patients History and Physical, chart, labs and discussed the procedure including the risks, benefits and alternatives for the proposed anesthesia with the patient or authorized representative who has indicated his/her understanding and acceptance.     Dental advisory given  Plan Discussed with: CRNA and Surgeon  Anesthesia Plan Comments:         Anesthesia Quick Evaluation

## 2018-10-23 NOTE — Anesthesia Postprocedure Evaluation (Signed)
Anesthesia Post Note  Patient: Yvonne [name]  Procedure(s) Performed: UPPER ENDOSCOPIC ULTRASOUND (EUS) RADIAL (N/A ) COLONOSCOPY WITH PROPOFOL (N/A )     Patient location during evaluation: PACU Anesthesia Type: MAC Level of consciousness: awake and alert Pain management: pain level controlled Vital Signs Assessment: post-procedure vital signs reviewed and stable Respiratory status: spontaneous breathing, nonlabored ventilation, respiratory function stable and patient connected to nasal cannula oxygen Cardiovascular status: stable and blood pressure returned to baseline Postop Assessment: no apparent nausea or vomiting Anesthetic complications: no    Last Vitals:  Vitals:   10/23/18 1110 10/23/18 1120  BP: 130/61 (!) 141/64  Pulse: 69 76  Resp: 13 19  Temp:    SpO2: 100% 98%    Last Pain:  Vitals:   10/23/18 1120  TempSrc:   PainSc: 0-No pain                 Britiney Blahnik S

## 2018-10-23 NOTE — Op Note (Signed)
[facility] Patient Name: Yvonne [name] Procedure Date: 10/23/2018 MRN: [id number] Attending MD: [name] , MD Date of Birth: 1946/04/14 CSN: [id number] Age: 73 Admit Type: Outpatient Procedure:                Colonoscopy Indications:              Screening for colorectal malignant neoplasm Providers:                [name], MD, Cleda Daub, RN, Elspeth Cho Tech., Technician, Karis Juba, CRNA Referring MD:              Medicines:                Monitored Anesthesia Care Complications:            No immediate complications. Estimated blood loss:                            None. Estimated Blood Loss:     Estimated blood loss: none. Procedure:                Pre-Anesthesia Assessment:                           - Prior to the procedure, a History and Physical                            was performed, and patient medications and                            allergies were reviewed. The patient's tolerance of                            previous anesthesia was also reviewed. The risks                            and benefits of the procedure and the sedation                            options and risks were discussed with the patient.                            All questions were answered, and informed consent                            was obtained. Prior Anticoagulants: The patient has                            taken no previous anticoagulant or antiplatelet                            agents. ASA Grade Assessment: II - A patient with  mild systemic disease. After reviewing the risks                            and benefits, the patient was deemed in                            satisfactory condition to undergo the procedure.                           After obtaining informed consent, the colonoscope                            was passed under direct vision. Throughout the   procedure, the patient's blood pressure, pulse, and                            oxygen saturations were monitored continuously. The                            [id number] ([id number]) Olympus colonoscope was                            introduced through the anus and advanced to the the                            cecum, identified by appendiceal orifice and                            ileocecal valve. The colonoscopy was performed                            without difficulty. The patient tolerated the                            procedure well. The quality of the bowel                            preparation was good. The ileocecal valve,                            appendiceal orifice, and rectum were photographed. Scope In: 10:30:08 AM Scope Out: 10:41:12 AM Scope Withdrawal Time: 0 hours 5 minutes 32 seconds  Total Procedure Duration: 0 hours 11 minutes 4 seconds  Findings:      Multiple small-mouthed diverticula were found in the left colon.      The exam was otherwise without abnormality on direct and retroflexion       views. Impression:               - Diverticulosis in the left colon.                           - The examination was otherwise normal on direct                            and retroflexion views.                           -  No polyps or cancers. Moderate Sedation:      Not Applicable - Patient had care per Anesthesia. Recommendation:           - Patient has a contact number available for                            emergencies. The signs and symptoms of potential                            delayed complications were discussed with the                            patient. Return to normal activities tomorrow.                            Written discharge instructions were provided to the                            patient.                           - Resume previous diet.                           - Continue present medications.                           - Repeat colonoscopy in  10 years for screening. Procedure Code(s):        --- Professional ---                           [phone], Colonoscopy, flexible; diagnostic, including                            collection of specimen(s) by brushing or washing,                            when performed (separate procedure) Diagnosis Code(s):        --- Professional ---                           Z12.11, Encounter for screening for malignant                            neoplasm of colon                           K57.30, Diverticulosis of large intestine without                            perforation or abscess without bleeding CPT copyright 2018 [employer]. All rights reserved. The codes documented in this report are preliminary and upon coder review may  be revised to meet current compliance requirements. [name], MD 10/23/2018 10:43:33 AM This report has been signed electronically. Number of Addenda: 0

## 2018-10-23 NOTE — Op Note (Signed)
[facility] Patient Name: Yvonne [name] Procedure Date: 10/23/2018 MRN: [id number] Attending MD: [name] , MD Date of Birth: 09/29/1945 CSN: [id number] Age: 73 Admit Type: Outpatient Procedure:                Upper EUS Indications:              Screening for pancreatic neoplasm; Brother had                            pancreatic cancer, she has an incidental tail of                            pancreas cyst noted 2018. MRI 2019 Radiologist                            report: "The pancreatic tail cystic lesion measures                            1.1 by 0.5 by 0.8 cm, and by my measurement was                            previously 0.9 by 0.5 by 0.8 cm (2018). This does                            not qualify as interval growth and currently the                            lesion has no worrisome characteristics" Providers:                [name], MD, Cleda Daub, RN, Elspeth Cho Tech., Technician, Karis Juba, CRNA Referring MD:             Truitt Merle, MD Medicines:                Monitored Anesthesia Care Complications:            No immediate complications. Estimated blood loss:                            None. Estimated Blood Loss:     Estimated blood loss: none. Procedure:                Pre-Anesthesia Assessment:                           - Prior to the procedure, a History and Physical                            was performed, and patient medications and                            allergies were reviewed. The patient's tolerance of  previous anesthesia was also reviewed. The risks                            and benefits of the procedure and the sedation                            options and risks were discussed with the patient.                            All questions were answered, and informed consent                            was obtained. Prior Anticoagulants: The patient has                    taken no previous anticoagulant or antiplatelet                            agents. ASA Grade Assessment: II - A patient with                            mild systemic disease. After reviewing the risks                            and benefits, the patient was deemed in                            satisfactory condition to undergo the procedure.                           After obtaining informed consent, the endoscope was                            passed under direct vision. Throughout the                            procedure, the patient's blood pressure, pulse, and                            oxygen saturations were monitored continuously. The                            [id number] ([id number]) Olympus Radial EUS was                            introduced through the mouth, and advanced to the                            second part of duodenum. Scope In: Scope Out: Findings:      Endoscopic findings (limited views with radial and linear echoendoscope):      1. Normal UGI tract.      ENDOSONOGRAPHIC FINDING: :      1. A cyst was identified in the pancreatic tail. It is not in obvious  communication with the pancreatic duct. The lesion measured 10 mm in       maximal cross-sectional diameter. There was a single compartment and no       associated mass. There was no internal debris within the fluid-filled       cavity.      2. Normal, non-dilated main pancreatic duct.      3. The pancreatic parenchyma was otherwise normal.      4. No peripancreatic adenopathy. Impression:               - Cyst in pancreatic tail without any concerning                            morphologic features.                           - Otherwise normal examination. Moderate Sedation:      Not Applicable - Patient had care per Anesthesia. Recommendation:           - Discharge patient to home.                           - Consider continued pancreatic cancer screening                            with  alternating MRI and upper EUS evaluation. Will                            plan recall upper EUS in 2 years for now. Will                            communicate these recommendations with Drs. Jone Baseman. Procedure Code(s):        --- Professional ---                           [phone], Esophagogastroduodenoscopy, flexible,                            transoral; with endoscopic ultrasound examination                            limited to the esophagus, stomach or duodenum, and                            adjacent structures Diagnosis Code(s):        --- Professional ---                           I96.7, Cyst of pancreas                           Z12.89, Encounter for screening for malignant                            neoplasm  of other sites CPT copyright 2018 [place]. All rights reserved. The codes documented in this report are preliminary and upon coder review may  be revised to meet current compliance requirements. [name], MD 10/23/2018 11:08:26 AM This report has been signed electronically. Number of Addenda: 0

## 2018-10-23 NOTE — H&P (Signed)
HPI: This is a very pleasant 61 woman  Chief complaint is routine risk for colon cancer, increased risk for pancreatic cancer  ROS: complete GI ROS as described in HPI, all other review negative.  Constitutional:  No unintentional weight loss   Past Medical History:  Diagnosis Date  . Anxiety   . Chest pressure   . Degenerative arthritis   . Diabetes mellitus without complication (HCC)    diet controlled  . Diabetic neuropathy ([place]) 12/03/2017  . Heart palpitations   . History of diastolic dysfunction    per echo in 2011  . Hyperlipidemia   . Hypertension   . Hypothyroidism   . LVH (left ventricular hypertrophy)    per echo in 2011  . Normal nuclear stress test 2011  . Polyneuropathy in other diseases classified elsewhere ([place]) 05/21/2013  . Sleep difficulties   . Tinnitus     Past Surgical History:  Procedure Laterality Date  . ABDOMINAL HYSTERECTOMY    . APPENDECTOMY    . CARDIOVASCULAR STRESS TEST  11/09/2009   EF 75%  . CARPAL TUNNEL RELEASE     bilateral  . TRANSTHORACIC ECHOCARDIOGRAM  11/01/2009   NORMAL LV FILLING AND DIASTOLIC DYSFUNCTION AND MILD AORTIC SCLEROSIS AND TRACE MITRAL REGURGITATION    Current Facility-Administered Medications  Medication Dose Route Frequency Provider Last Rate Last Dose  . 0.9 %  sodium chloride infusion   Intravenous Continuous [name], MD        Allergies as of 09/19/2018 - Review Complete 09/17/2018  Allergen Reaction Noted  . Tobramycin Hives 01/31/2011  . Sulfur Nausea Only 06/20/2017  . Erythromycin Nausea Only 12/28/2011    Family History  Problem Relation Age of Onset  . Hypertension Mother   . Heart disease Mother   . Dementia Mother   . Heart disease Father   . Arthritis Father   . Diabetes Father   . Hypertension Father   . Cancer Brother 15       pancreatic  . Other Brother        automobile accident  . Stroke Maternal Grandmother   . Heart disease Maternal Grandfather   . Cancer Paternal  Aunt        malignant brain cancer  . Cancer Other 49       pancreatic neuroendocrine carcinoma  . Cancer Cousin 64       pancreatic  . Cancer Cousin 60       pancreatic  . Cancer Cousin 52       breast  . Cancer Cousin 67       uterine  . Cancer Cousin 75       unknown primary  . Colon cancer Neg Hx     Social History   Socioeconomic History  . Marital status: Married    Spouse name: Edd Fabian  . Number of children: 1  . Years of education: COLLEGE  . Highest education level: Not on file  Occupational History  . Occupation: Retired  Scientific laboratory technician  . Financial resource strain: Not on file  . Food insecurity:    Worry: Not on file    Inability: Not on file  . Transportation needs:    Medical: Not on file    Non-medical: Not on file  Tobacco Use  . Smoking status: Former Smoker    Packs/day: 0.50    Years: 10.00    Pack years: 5.00    Last attempt to quit: 08/14/1975    Years since quitting:  43.2  . Smokeless tobacco: Never Used  Substance and Sexual Activity  . Alcohol use: Yes    Comment: one glass of wine a week  . Drug use: No  . Sexual activity: Not on file  Lifestyle  . Physical activity:    Days per week: Not on file    Minutes per session: Not on file  . Stress: Not on file  Relationships  . Social connections:    Talks on phone: Not on file    Gets together: Not on file    Attends religious service: Not on file    Active member of club or organization: Not on file    Attends meetings of clubs or organizations: Not on file    Relationship status: Not on file  . Intimate partner violence:    Fear of current or ex partner: Not on file    Emotionally abused: Not on file    Physically abused: Not on file    Forced sexual activity: Not on file  Other Topics Concern  . Not on file  Social History Narrative   Lives at home w/ her husband, Edd Fabian.   Right-handed.   Drinks no caffeine but does eat chocolate occasionally.   epworth sleepiness scale = 3  (01/10/16)     Physical Exam: There were no vitals taken for this visit. Constitutional: generally well-appearing Psychiatric: alert and oriented x3 Abdomen: soft, nontender, nondistended, no obvious ascites, no peritoneal signs, normal bowel sounds No peripheral edema noted in lower extremities  Assessment and plan: 73 y.o. female with routine risk for colon cancer, increased risk for pancreatic cancer  For colonoscoyp and EUS today  Please see the "Patient Instructions" section for addition details about the plan.  Owens Loffler, MD [facility] 10/23/2018, 8:50 AM

## 2018-10-24 ENCOUNTER — Encounter (HOSPITAL_COMMUNITY): Payer: Self-pay | Admitting: Gastroenterology

## 2018-11-07 ENCOUNTER — Telehealth: Payer: Self-pay | Admitting: Gastroenterology

## 2018-11-07 NOTE — Telephone Encounter (Signed)
Pt had a procedure on 3-12 and is having a few minor symptoms and would like to know if it could be related to the procedure. PT said if maybe she just needs some probiotics. She feels a flush on her face like about to run a fever but does not have a fever.

## 2018-11-07 NOTE — Telephone Encounter (Signed)
Pt. Called concerned about feeling flush a few times a day since her Colonoscopy/EUS on 10/23/18. She was wanting to know if it could be connected to her normal gut flora being altered from the prep. Asking should she take Probiotic for a few days/Please advise

## 2018-11-10 NOTE — Telephone Encounter (Signed)
Pretty unlikely that her symptoms of 'flushing' are related to the procedures 2-3 weeks ago.

## 2018-11-10 NOTE — Telephone Encounter (Signed)
Called pt. With Dr. Ardis Hughs advise that he did not think the flushing pt. Was experiencing was related to her procedure 2-3 weeks ago. Pt. Ask if it would hurt to still take a probiotic, stated that would be OK if she wanted. Pt. Asked for a recommendation. Stated Align OTC would be an opt.

## 2018-12-03 ENCOUNTER — Telehealth: Payer: Self-pay

## 2018-12-03 NOTE — Telephone Encounter (Signed)
Patient has given verbal consent to do webex visit and to file her insurance. E-mail has been confirmed and sent.

## 2018-12-04 ENCOUNTER — Telehealth: Payer: Self-pay | Admitting: Neurology

## 2018-12-04 NOTE — Telephone Encounter (Signed)
Pt has called and states she no longer wants to do a VV.  Pt accepted an OV with Dr Jannifer Franklin on 06-23.  No call back requested

## 2018-12-04 NOTE — Telephone Encounter (Signed)
Noted! Thank you

## 2018-12-04 NOTE — Telephone Encounter (Signed)
Noted, thanks!

## 2018-12-08 ENCOUNTER — Ambulatory Visit: Payer: Medicare Other | Admitting: Adult Health

## 2018-12-08 ENCOUNTER — Ambulatory Visit: Payer: Medicare Other | Admitting: Neurology

## 2018-12-15 ENCOUNTER — Other Ambulatory Visit: Payer: Medicare Other

## 2018-12-15 ENCOUNTER — Ambulatory Visit: Payer: Medicare Other | Admitting: Hematology

## 2018-12-18 ENCOUNTER — Ambulatory Visit: Payer: Medicare Other | Admitting: Podiatry

## 2019-01-22 ENCOUNTER — Ambulatory Visit (INDEPENDENT_AMBULATORY_CARE_PROVIDER_SITE_OTHER): Payer: Medicare Other | Admitting: Podiatry

## 2019-01-22 ENCOUNTER — Encounter: Payer: Self-pay | Admitting: Podiatry

## 2019-01-22 ENCOUNTER — Ambulatory Visit: Payer: Medicare Other

## 2019-01-22 ENCOUNTER — Other Ambulatory Visit: Payer: Self-pay

## 2019-01-22 VITALS — BP 134/70 | HR 73 | Temp 97.6°F | Resp 16

## 2019-01-22 DIAGNOSIS — M2042 Other hammer toe(s) (acquired), left foot: Secondary | ICD-10-CM | POA: Diagnosis not present

## 2019-01-22 DIAGNOSIS — M2041 Other hammer toe(s) (acquired), right foot: Secondary | ICD-10-CM | POA: Diagnosis not present

## 2019-01-22 DIAGNOSIS — E1142 Type 2 diabetes mellitus with diabetic polyneuropathy: Secondary | ICD-10-CM

## 2019-01-22 NOTE — Progress Notes (Signed)
Subjective:  Patient ID: Yvonne [name], female    DOB: 1946-05-07,  MRN: [id number] HPI Chief Complaint  Patient presents with  . Diabetes    Diabetic foot exam - no pain, does have neuropathy, takes meds for neuropathy, not on any meds for diabetes  . New Patient (Initial Visit)    73 y.o. female presents with the above complaint.   ROS: Denies fever chills nausea vomiting muscle aches pains calf pain back pain chest pain shortness of breath.  Past Medical History:  Diagnosis Date  . Anxiety   . Chest pressure   . Degenerative arthritis   . Diabetes mellitus without complication (HCC)    diet controlled  . Diabetic neuropathy ([place]) 12/03/2017  . Heart palpitations   . History of diastolic dysfunction    per echo in 2011  . Hyperlipidemia   . Hypertension   . Hypothyroidism   . LVH (left ventricular hypertrophy)    per echo in 2011  . Normal nuclear stress test 2011  . Polyneuropathy in other diseases classified elsewhere ([place]) 05/21/2013  . Sleep difficulties   . Tinnitus    Past Surgical History:  Procedure Laterality Date  . ABDOMINAL HYSTERECTOMY    . APPENDECTOMY    . CARDIOVASCULAR STRESS TEST  11/09/2009   EF 75%  . CARPAL TUNNEL RELEASE     bilateral  . COLONOSCOPY WITH PROPOFOL N/A 10/23/2018   Procedure: COLONOSCOPY WITH PROPOFOL;  Surgeon: [name], MD;  Location: [facility];  Service: Endoscopy;  Laterality: N/A;  . ESOPHAGOGASTRODUODENOSCOPY (EGD) WITH PROPOFOL N/A 10/23/2018   Procedure: ESOPHAGOGASTRODUODENOSCOPY (EGD) WITH PROPOFOL;  Surgeon: [name], MD;  Location: [facility];  Service: Endoscopy;  Laterality: N/A;  . EUS N/A 10/23/2018   Procedure: UPPER ENDOSCOPIC ULTRASOUND (EUS) RADIAL;  Surgeon: [name], MD;  Location: [facility];  Service: Endoscopy;  Laterality: N/A;  . TRANSTHORACIC ECHOCARDIOGRAM  11/01/2009   NORMAL LV FILLING AND DIASTOLIC DYSFUNCTION AND MILD AORTIC SCLEROSIS AND TRACE MITRAL REGURGITATION    Current Outpatient Medications:  .  ALPRAZolam (XANAX) 0.25 MG tablet, Take 0.25 mg by mouth 3 (three) times daily as needed for anxiety., Disp: , Rfl:  .  aspirin 81 MG tablet, Take 81 mg by mouth daily.  , Disp: , Rfl:  .  Cholecalciferol (VITAMIN D) 50 MCG (2000 UT) tablet, Take 2,000 Units by mouth daily. , Disp: , Rfl:  .  fluticasone (FLONASE) 50 MCG/ACT nasal spray, Place 1 spray into both nostrils daily as needed for allergies or rhinitis., Disp: , Rfl:  .  furosemide (LASIX) 20 MG tablet, Take 10 mg by mouth daily., Disp: , Rfl:  .  gabapentin (NEURONTIN) 100 MG capsule, Take 1 capsule (100 mg total) by mouth 3 (three) times daily as needed., Disp: 270 capsule, Rfl: 1 .  gabapentin (NEURONTIN) 300 MG capsule, Take 300 mg by mouth at bedtime. , Disp: , Rfl:  .  levothyroxine (SYNTHROID, LEVOTHROID) 100 MCG tablet, Take 100 mcg by mouth daily before breakfast., Disp: , Rfl:  .  metoprolol succinate (TOPROL-XL) 25 MG 24 hr tablet, TAKE 1/2 TABLET BY MOUTH DAILY. (Patient taking differently: Take 12.5 mg by mouth daily. ), Disp: 45 tablet, Rfl: 3 .  metroNIDAZOLE (METROCREAM) 0.75 % cream, Apply 1 application topically daily. (ROSACEA), Disp: , Rfl:  .  omeprazole (PRILOSEC) 10 MG capsule, Take 10 mg by mouth daily as needed (indigestion). , Disp: , Rfl:  .  potassium chloride SA (K-DUR,KLOR-CON) 20 MEQ tablet,  TAKE 1/2 TABLET BY MOUTH DAILY. (Patient taking differently: Take 10 mEq by mouth daily. ), Disp: 45 tablet, Rfl: 3 .  Prenatal Vit-Fe Fumarate-FA (MULTIVITAMIN-PRENATAL) 27-0.8 MG TABS, Take 1 tablet by mouth daily., Disp: , Rfl:  .  Prenatal Vit-Fe Fumarate-FA (PRENATAL MULTIVITAMIN) TABS tablet, Take 1 tablet by mouth daily at 12 noon., Disp: , Rfl:  .  rosuvastatin (CRESTOR) 5 MG tablet, Take 1 tablet (5 mg total) by mouth daily., Disp: 90 tablet, Rfl: 3  Allergies  Allergen Reactions  . Tobramycin Hives  . Sulfur Nausea Only  . Erythromycin Nausea Only   Review of Systems  Objective:   Vitals:   01/22/19 0839  BP: 134/70  Pulse: 73  Resp: 16  Temp: 97.6 F (36.4 C)    General: Well developed, nourished, in no acute distress, alert and oriented x3   Dermatological: Skin is warm, dry and supple bilateral. Nails x 10 are well maintained; remaining integument appears unremarkable at this time. There are no open sores, no preulcerative lesions, no rash or signs of infection present.  Vascular: Dorsalis Pedis artery and Posterior Tibial artery pedal pulses are 2/4 bilateral with immedate capillary fill time. Pedal hair growth present. No varicosities and no lower extremity edema present bilateral.   Neruologic: Grossly intact via light touch bilateral. Vibratory intact via tuning fork bilateral. Protective threshold with Semmes Wienstein monofilament intact to all pedal sites bilateral. Patellar and Achilles deep tendon reflexes 2+ bilateral. No Babinski or clonus noted bilateral.   Musculoskeletal: No gross boney pedal deformities bilateral. No pain, crepitus, or limitation noted with foot and ankle range of motion bilateral. Muscular strength 5/5 in all groups tested bilateral.  Mild flexible hammertoe deformities.  No reproducible pain.  Gait: Unassisted, Nonantalgic.    Radiographs:  None taken  Assessment & Plan:   Assessment: Diabetes with diabetic peripheral neuropathy  Plan: Normal diabetic exam other than some diabetic peripheral neuropathy recommended her to continue her current medications possibly consider diabetic shoes at some point.      T. [place], [place]

## 2019-02-03 ENCOUNTER — Ambulatory Visit: Payer: Medicare Other | Admitting: Neurology

## 2019-02-24 ENCOUNTER — Other Ambulatory Visit: Payer: Medicare Other | Admitting: Orthotics

## 2019-03-09 ENCOUNTER — Other Ambulatory Visit: Payer: Self-pay

## 2019-03-09 ENCOUNTER — Ambulatory Visit: Payer: Medicare Other | Admitting: Orthotics

## 2019-03-09 DIAGNOSIS — E1142 Type 2 diabetes mellitus with diabetic polyneuropathy: Secondary | ICD-10-CM

## 2019-03-09 DIAGNOSIS — M2041 Other hammer toe(s) (acquired), right foot: Secondary | ICD-10-CM

## 2019-03-09 NOTE — Progress Notes (Signed)

## 2019-04-06 ENCOUNTER — Telehealth: Payer: Self-pay | Admitting: Podiatry

## 2019-04-06 NOTE — Telephone Encounter (Signed)
Left message for Yvonne [name] that she needs an appt to see Dr Sabra Heck since her last appt that we were given was December of 2019. I asked Yvonne [name] to call and let me know when she has an appt and I can fax paperwork to them for an updated signature.

## 2019-04-06 NOTE — Telephone Encounter (Signed)
Pt returned my call and stated she did have a virtual visit in June with the doctor. I will refax paperwork with a note to see if we can get it signed off on so pt can get her diabetic shoes

## 2019-04-21 ENCOUNTER — Ambulatory Visit: Payer: Self-pay | Admitting: Surgery

## 2019-05-13 ENCOUNTER — Telehealth: Payer: Self-pay | Admitting: Podiatry

## 2019-05-13 NOTE — Telephone Encounter (Signed)
Pt left message checking on status of inserts.  Upon checking pt was waiting on diabetic shoes and inserts. The completed  paperwork was received on 9.28 and the inserts are in the process of being made and estimated to ship 10.12.2020.  I contacted pt and made her aware and told her I would call pt when they come in to schedule an appt to pick them up.

## 2019-05-25 NOTE — Progress Notes (Signed)
[facility] # [id number], [place] [address] Phone: [phone] Fax: [phone]      Your procedure is scheduled on Tuesday, October 20th, 2020.  Report to [facility] Main Entrance "A" at 7:15 A.M., and check in at the Admitting office.   Call this number if you have problems the morning of surgery:  [phone]  Call [phone] if you have any questions prior to your surgery date Monday-Friday 8am-4pm    Remember:  Do not eat or drink after midnight the night before your surgery    Take these medicines the morning of surgery with A SIP OF WATER: Alprazolam (Xanax) - if needed Fluticasone (Flonase) nasal spray - if needed Levothyroxine (Synthroid) Metoprolol Succinate (Toprol XL) Omeprazole (Prilosec) - if needed Rosuvastatin (Crestor)   7 days prior to surgery STOP taking any Aspirin (unless otherwise instructed by your surgeon), Aleve, Naproxen, Ibuprofen, Motrin, Advil, Goody's, BC's, all herbal medications, fish oil, and all vitamins.    The Morning of Surgery  Do not wear jewelry, make-up or nail polish.  Do not wear lotions, powders, or perfumes/colognes, or deodorant  Do not shave 48 hours prior to surgery.  Men may shave face and neck.  Do not bring valuables to the hospital.  [facility] is not responsible for any belongings or valuables.  If you are a smoker, DO NOT Smoke 24 hours prior to surgery IF you wear a CPAP at night please bring your mask, tubing, and machine the morning of surgery   Remember that you must have someone to transport you home after your surgery, and remain with you for 24 hours if you are discharged the same day.   Contacts, glasses, hearing aids, dentures or bridgework may not be worn into surgery.    Leave your suitcase in the car.  After surgery it may be brought to your room.  For patients admitted to the hospital, discharge time will be determined by your  treatment team.  Patients discharged the day of surgery will not be allowed to drive home.    Special instructions:   [place]- Preparing For Surgery  Before surgery, you can play an important role. Because skin is not sterile, your skin needs to be as free of germs as possible. You can reduce the number of germs on your skin by washing with CHG (chlorahexidine gluconate) Soap before surgery.  CHG is an antiseptic cleaner which kills germs and bonds with the skin to continue killing germs even after washing.    Oral Hygiene is also important to reduce your risk of infection.  Remember - BRUSH YOUR TEETH THE MORNING OF SURGERY WITH YOUR REGULAR TOOTHPASTE  Please do not use if you have an allergy to CHG or antibacterial soaps. If your skin becomes reddened/irritated stop using the CHG.  Do not shave (including legs and underarms) for at least 48 hours prior to first CHG shower. It is OK to shave your face.  Please follow these instructions carefully.   1. Shower the NIGHT BEFORE SURGERY and the MORNING OF SURGERY with CHG Soap.   2. If you chose to wash your hair, wash your hair first as usual with your normal shampoo.  3. After you shampoo, rinse your hair and body thoroughly to remove the shampoo.  4. Use CHG as you would any other liquid soap. You can apply CHG directly to the skin and wash gently with a scrungie or a  clean washcloth.   5. Apply the CHG Soap to your body ONLY FROM THE NECK DOWN.  Do not use on open wounds or open sores. Avoid contact with your eyes, ears, mouth and genitals (private parts). Wash Face and genitals (private parts)  with your normal soap.   6. Wash thoroughly, paying special attention to the area where your surgery will be performed.  7. Thoroughly rinse your body with warm water from the neck down.  8. DO NOT shower/wash with your normal soap after using and rinsing off the CHG Soap.  9. Pat yourself dry with a CLEAN TOWEL.  10. Wear CLEAN  PAJAMAS to bed the night before surgery, wear comfortable clothes the morning of surgery  11. Place CLEAN SHEETS on your bed the night of your first shower and DO NOT SLEEP WITH PETS.    Day of Surgery:  Do not apply any deodorants/lotions. Please shower the morning of surgery with the CHG soap  Please wear clean clothes to the hospital/surgery center.   Remember to brush your teeth WITH YOUR REGULAR TOOTHPASTE.   Please read over the following fact sheets that you were given.

## 2019-05-26 ENCOUNTER — Other Ambulatory Visit: Payer: Self-pay

## 2019-05-26 ENCOUNTER — Encounter (HOSPITAL_COMMUNITY): Payer: Self-pay

## 2019-05-26 ENCOUNTER — Encounter (HOSPITAL_COMMUNITY)
Admission: RE | Admit: 2019-05-26 | Discharge: 2019-05-26 | Disposition: A | Discharge status: Home / Self Care | Payer: Medicare Other | Source: Ambulatory Visit | Attending: Surgery | Admitting: Surgery

## 2019-05-26 DIAGNOSIS — E039 Hypothyroidism, unspecified: Secondary | ICD-10-CM | POA: Diagnosis not present

## 2019-05-26 DIAGNOSIS — Z87891 Personal history of nicotine dependence: Secondary | ICD-10-CM | POA: Diagnosis not present

## 2019-05-26 DIAGNOSIS — Z01818 Encounter for other preprocedural examination: Secondary | ICD-10-CM | POA: Insufficient documentation

## 2019-05-26 DIAGNOSIS — I10 Essential (primary) hypertension: Secondary | ICD-10-CM | POA: Insufficient documentation

## 2019-05-26 DIAGNOSIS — E119 Type 2 diabetes mellitus without complications: Secondary | ICD-10-CM | POA: Diagnosis not present

## 2019-05-26 DIAGNOSIS — R1904 Left lower quadrant abdominal swelling, mass and lump: Secondary | ICD-10-CM | POA: Diagnosis not present

## 2019-05-26 DIAGNOSIS — Z79899 Other long term (current) drug therapy: Secondary | ICD-10-CM | POA: Diagnosis not present

## 2019-05-26 DIAGNOSIS — Z7982 Long term (current) use of aspirin: Secondary | ICD-10-CM | POA: Diagnosis not present

## 2019-05-26 LAB — HEMOGLOBIN A1C
Hgb A1c MFr Bld: 6.6 % — ABNORMAL HIGH (ref 4.8–5.6)
Mean Plasma Glucose: 142.72 mg/dL

## 2019-05-26 LAB — BASIC METABOLIC PANEL
Anion gap: 11 (ref 5–15)
BUN: 12 mg/dL (ref 8–23)
CO2: 24 mmol/L (ref 22–32)
Calcium: 9.2 mg/dL (ref 8.9–10.3)
Chloride: 103 mmol/L (ref 98–111)
Creatinine, Ser: 0.68 mg/dL (ref 0.44–1.00)
GFR calc Af Amer: >60 mL/min (ref >60)
GFR calc non Af Amer: >60 mL/min (ref >60)
Glucose, Bld: 243 mg/dL — ABNORMAL HIGH (ref 70–99)
Potassium: 4.3 mmol/L (ref 3.5–5.1)
Sodium: 138 mmol/L (ref 135–145)

## 2019-05-26 LAB — GLUCOSE, CAPILLARY: Glucose-Capillary: 241 mg/dL — ABNORMAL HIGH (ref 70–99)

## 2019-05-26 LAB — CBC
HCT: 41.7 % (ref 36.0–46.0)
Hemoglobin: 13.9 g/dL (ref 12.0–15.0)
MCH: 28.8 pg (ref 26.0–34.0)
MCHC: 33.3 g/dL (ref 30.0–36.0)
MCV: 86.5 fL (ref 80.0–100.0)
Platelets: 191 10*3/uL (ref 150–400)
RBC: 4.82 MIL/uL (ref 3.87–5.11)
RDW: 13.1 % (ref 11.5–15.5)
WBC: 5.2 10*3/uL (ref 4.0–10.5)
nRBC: 0 % (ref 0.0–0.2)

## 2019-05-26 NOTE — Progress Notes (Signed)
PCP - Kathyrn Lass, MD Cardiologist - Dr. Lyman Bishop, MD  PPM/ICD - N/A  Chest x-ray - N/A EKG - 05/26/19 Stress Test - 01/03/2012 ECHO - 11/01/2009 Cardiac Cath - denies  Sleep Study - denies  Fasting Blood Sugar - unknown Checks Blood Sugar: never  CBG 241 at PAT visit; however, patient had a full breakfast prior to coming for appt  Blood Thinner Instructions: N/A Aspirin Instructions: Stop 7 days prior to procedure; LD 05/26/2019  COVID TEST- scheduled 05/29/2019   Coronavirus Screening  Have you experienced the following symptoms:  Cough yes/no: No Fever (>100.79F)  yes/no: No Runny nose yes/no: No Sore throat yes/no: No Difficulty breathing/shortness of breath  yes/no: No  Have you or a family member traveled in the last 14 days and where? yes/no: No   If the patient indicates "YES" to the above questions, their PAT will be rescheduled to limit the exposure to others and, the surgeon will be notified. THE PATIENT WILL NEED TO BE ASYMPTOMATIC FOR 14 DAYS.   If the patient is not experiencing any of these symptoms, the PAT nurse will instruct them to NOT bring anyone with them to their appointment since they may have these symptoms or traveled as well.   Please remind your patients and families that hospital visitation restrictions are in effect and the importance of the restrictions.    Anesthesia review: Yes; Hx heart palpitations; diastolic dysfunction; HTN; LVH  Patient denies shortness of breath, fever, cough and chest pain at PAT appointment   Patient verbalized understanding of instructions that were given to them at the PAT appointment. Patient was also instructed that they will need to review over the PAT instructions again at home before surgery.

## 2019-05-27 NOTE — Anesthesia Preprocedure Evaluation (Addendum)
Anesthesia Evaluation  Patient identified by MRN, date of birth, ID band Patient awake    Reviewed: Allergy & Precautions, H&P , NPO status , Patient's Chart, lab work & pertinent test results, reviewed documented beta blocker date and time   Airway Mallampati: II  TM Distance: >3 FB Neck ROM: Full    Dental no notable dental hx. (+) Teeth Intact, Dental Advisory Given   Pulmonary neg pulmonary ROS, former smoker,    Pulmonary exam normal breath sounds clear to auscultation       Cardiovascular hypertension, Pt. on medications and Pt. on home beta blockers  Rhythm:Regular Rate:Normal     Neuro/Psych Anxiety negative neurological ROS     GI/Hepatic negative GI ROS, Neg liver ROS,   Endo/Other  diabetesHypothyroidism   Renal/GU negative Renal ROS  negative genitourinary   Musculoskeletal   Abdominal   Peds  Hematology negative hematology ROS (+)   Anesthesia Other Findings   Reproductive/Obstetrics negative OB ROS                           Anesthesia Physical Anesthesia Plan  ASA: II  Anesthesia Plan: General   Post-op Pain Management:    Induction: Intravenous  PONV Risk Score and Plan: 4 or greater and Ondansetron, Dexamethasone and Propofol infusion  Airway Management Planned: LMA  Additional Equipment:   Intra-op Plan:   Post-operative Plan: Extubation in OR  Informed Consent: I have reviewed the patients History and Physical, chart, labs and discussed the procedure including the risks, benefits and alternatives for the proposed anesthesia with the patient or authorized representative who has indicated his/her understanding and acceptance.     Dental advisory given  Plan Discussed with: CRNA  Anesthesia Plan Comments: (PAT note written by Myra Gianotti, PA-C. )       Anesthesia Quick Evaluation

## 2019-05-27 NOTE — Progress Notes (Signed)
Anesthesia Chart Review:  Case: [id number] Date/Time: 06/02/19 0900   Procedure: EXCISION OF SUBCUTANEOUS ABDOMINAL WALL MASSES LEFT LOWER QUADRANT (Left )   Anesthesia type: General   Pre-op diagnosis: ABDOMINAL WALL MASSES-LEFT LOWER QUADRANT   Location: [facility] OR ROOM 06 / [place] OR   Surgeon: [name], MD      DISCUSSION: Patient is a 73 year old female scheduled for the above procedure.  History includes former smoker (quit 1977), HTN, HLD, hypothyroidism, heart palpitations, mild LVH with diastolic dysfunction ([id number] echo), DM2 (with neuropathy), tinnitus. Negative stress tests in 2011 and 2013 for evaluation of chest pain.   Last ASA 05/26/19. Patient denied shortness of breath, fever, cough and chest pain at PAT RN visit. BP 153/67. EKG stable (NSR, non-specific ST abnormality). A1c 6.6%.   COVID-19 test is scheduled for 05/29/19.  If negative and otherwise no acute changes and I would anticipate that she could proceed as planned.   VS: BP (!) 153/67 Comment: rechecked  Pulse 75   Temp 36.5 C   Resp 20   Ht 5' 6.5" (1.689 m)   Wt 69.6 kg   SpO2 100%   BMI 24.40 kg/m    PROVIDERS: Kathyrn Lass, MD is PCP - Owens Loffler, MD is GI - Truitt Merle, MD is HEM-ONC. Seen on 09/16/18 due to family history of pancreatic cancer. Annual screening with abdominal MRI/MRCP or EUS recommended. Had EUS 10/23/18 showing cyst in the pancreatic tail without any concerning morphologic features.  Kathrynn Ducking, MD is neurologist. Last visit 12/03/17 for diabetic peripheral neuropathy.  - Hilty, Nadean Corwin, MD is cardiologist. Last seen 01/10/16 for hypercholesterolemia and HTN. Had seen Darlin Coco, MD prior to that.   LABS: Labs reviewed: Acceptable for surgery. PAT glucose 243 (ate breakfast just before PAT); however, A1c 6.6%.  (all labs ordered are listed, but only abnormal results are displayed)  Labs Reviewed  GLUCOSE, CAPILLARY - Abnormal; Notable for the following components:   Result Value   Glucose-Capillary 241 (*)    All other components within normal limits  HEMOGLOBIN A1C - Abnormal; Notable for the following components:   Hgb A1c MFr Bld 6.6 (*)    All other components within normal limits  BASIC METABOLIC PANEL - Abnormal; Notable for the following components:   Glucose, Bld 243 (*)    All other components within normal limits  CBC      EKG: 05/26/19: Normal sinus rhythm Nonspecific ST abnormality Abnormal ECG Since last tracing rate slower Confirmed by Larae Grooms [phone]) on 05/26/2019 6:30:27 PM   CV: Nuclear stress test 01/03/12: Overall Impression:  Normal stress nuclear study. LV Ejection Fraction: 77%.  LV Wall Motion:  Normal Wall Motion  According to 02/05/11 office note by Dr. Mare Ferrari, "She's had a history of occasional chest pressure but her most recent nuclear stress test on 11/09/09 showed no ischemia and her ejection fraction was 75%.  Her last echocardiogram on 11/01/09 showed normal left ventricular systolic function but she did have diastolic dysfunction as well as mild aortic sclerosis and trace mitral regurgitation and mild LVH."   Past Medical History:  Diagnosis Date  . Anxiety   . Chest pressure   . Degenerative arthritis   . Diabetes mellitus without complication (HCC)    diet controlled  . Diabetic neuropathy ([place]) 12/03/2017  . Heart palpitations   . History of diastolic dysfunction    per echo in 2011  . Hyperlipidemia   . Hypertension   . Hypothyroidism   .  LVH (left ventricular hypertrophy)    per echo in 2011  . Normal nuclear stress test 2011  . Polyneuropathy in other diseases classified elsewhere ([place]) 05/21/2013  . Sleep difficulties   . Tinnitus     Past Surgical History:  Procedure Laterality Date  . ABDOMINAL HYSTERECTOMY    . APPENDECTOMY    . CARDIOVASCULAR STRESS TEST  11/09/2009   EF 75%  . CARPAL TUNNEL RELEASE     bilateral  . COLONOSCOPY WITH PROPOFOL N/A 10/23/2018    Procedure: COLONOSCOPY WITH PROPOFOL;  Surgeon: [name], MD;  Location: [facility];  Service: Endoscopy;  Laterality: N/A;  . ESOPHAGOGASTRODUODENOSCOPY (EGD) WITH PROPOFOL N/A 10/23/2018   Procedure: ESOPHAGOGASTRODUODENOSCOPY (EGD) WITH PROPOFOL;  Surgeon: [name], MD;  Location: [facility];  Service: Endoscopy;  Laterality: N/A;  . EUS N/A 10/23/2018   Procedure: UPPER ENDOSCOPIC ULTRASOUND (EUS) RADIAL;  Surgeon: [name], MD;  Location: [facility];  Service: Endoscopy;  Laterality: N/A;  . TRANSTHORACIC ECHOCARDIOGRAM  11/01/2009   NORMAL LV FILLING AND DIASTOLIC DYSFUNCTION AND MILD AORTIC SCLEROSIS AND TRACE MITRAL REGURGITATION    MEDICATIONS: . ALPRAZolam (XANAX) 0.25 MG tablet  . aspirin 81 MG tablet  . Cholecalciferol (VITAMIN D) 50 MCG (2000 UT) tablet  . fluticasone (FLONASE) 50 MCG/ACT nasal spray  . furosemide (LASIX) 20 MG tablet  . gabapentin (NEURONTIN) 100 MG capsule  . gabapentin (NEURONTIN) 300 MG capsule  . levothyroxine (SYNTHROID, LEVOTHROID) 100 MCG tablet  . metoprolol succinate (TOPROL-XL) 25 MG 24 hr tablet  . metroNIDAZOLE (METROCREAM) 0.75 % cream  . omeprazole (PRILOSEC) 20 MG capsule  . potassium chloride SA (K-DUR,KLOR-CON) 20 MEQ tablet  . Prenatal Vit-Fe Fumarate-FA (PRENATAL MULTIVITAMIN) TABS tablet  . rosuvastatin (CRESTOR) 5 MG tablet   No current facility-administered medications for this encounter.     Myra Gianotti, PA-C Surgical Short Stay/Anesthesiology [facility] Phone [phone] [facility] Phone [phone] 05/27/2019 2:05 PM

## 2019-05-29 ENCOUNTER — Other Ambulatory Visit (HOSPITAL_COMMUNITY)
Admission: RE | Admit: 2019-05-29 | Discharge: 2019-05-29 | Disposition: A | Discharge status: Home / Self Care | Payer: Medicare Other | Source: Ambulatory Visit | Attending: Surgery | Admitting: Surgery

## 2019-05-29 DIAGNOSIS — Z20828 Contact with and (suspected) exposure to other viral communicable diseases: Secondary | ICD-10-CM | POA: Insufficient documentation

## 2019-05-29 DIAGNOSIS — Z01812 Encounter for preprocedural laboratory examination: Secondary | ICD-10-CM | POA: Diagnosis present

## 2019-05-30 LAB — NOVEL CORONAVIRUS, NAA (HOSP ORDER, SEND-OUT TO REF LAB; TAT 18-24 HRS): SARS-CoV-2, NAA: NOT DETECTED

## 2019-06-02 ENCOUNTER — Ambulatory Visit (HOSPITAL_COMMUNITY)
Admission: RE | Admit: 2019-06-02 | Discharge: 2019-06-02 | Disposition: A | Discharge status: Home / Self Care | Payer: Medicare Other | Attending: Surgery | Admitting: Surgery

## 2019-06-02 ENCOUNTER — Ambulatory Visit (HOSPITAL_COMMUNITY): Payer: Medicare Other | Admitting: Vascular Surgery

## 2019-06-02 ENCOUNTER — Encounter (HOSPITAL_COMMUNITY)
Admission: RE | Disposition: A | Discharge status: Home / Self Care | Payer: Self-pay | Source: Home / Self Care | Attending: Surgery

## 2019-06-02 ENCOUNTER — Other Ambulatory Visit: Payer: Self-pay

## 2019-06-02 ENCOUNTER — Ambulatory Visit (HOSPITAL_COMMUNITY): Payer: Medicare Other | Admitting: Certified Registered"

## 2019-06-02 ENCOUNTER — Encounter (HOSPITAL_COMMUNITY): Payer: Self-pay

## 2019-06-02 DIAGNOSIS — Z79899 Other long term (current) drug therapy: Secondary | ICD-10-CM | POA: Insufficient documentation

## 2019-06-02 DIAGNOSIS — E039 Hypothyroidism, unspecified: Secondary | ICD-10-CM | POA: Insufficient documentation

## 2019-06-02 DIAGNOSIS — E785 Hyperlipidemia, unspecified: Secondary | ICD-10-CM | POA: Insufficient documentation

## 2019-06-02 DIAGNOSIS — Z881 Allergy status to other antibiotic agents status: Secondary | ICD-10-CM | POA: Insufficient documentation

## 2019-06-02 DIAGNOSIS — E1142 Type 2 diabetes mellitus with diabetic polyneuropathy: Secondary | ICD-10-CM | POA: Insufficient documentation

## 2019-06-02 DIAGNOSIS — E7800 Pure hypercholesterolemia, unspecified: Secondary | ICD-10-CM | POA: Diagnosis not present

## 2019-06-02 DIAGNOSIS — M1710 Unilateral primary osteoarthritis, unspecified knee: Secondary | ICD-10-CM | POA: Insufficient documentation

## 2019-06-02 DIAGNOSIS — Z87891 Personal history of nicotine dependence: Secondary | ICD-10-CM | POA: Insufficient documentation

## 2019-06-02 DIAGNOSIS — F419 Anxiety disorder, unspecified: Secondary | ICD-10-CM | POA: Diagnosis not present

## 2019-06-02 DIAGNOSIS — I119 Hypertensive heart disease without heart failure: Secondary | ICD-10-CM | POA: Diagnosis not present

## 2019-06-02 DIAGNOSIS — Z800 Family history of malignant neoplasm of digestive organs: Secondary | ICD-10-CM | POA: Diagnosis not present

## 2019-06-02 DIAGNOSIS — R229 Localized swelling, mass and lump, unspecified: Secondary | ICD-10-CM | POA: Diagnosis present

## 2019-06-02 DIAGNOSIS — Z7982 Long term (current) use of aspirin: Secondary | ICD-10-CM | POA: Diagnosis not present

## 2019-06-02 DIAGNOSIS — D171 Benign lipomatous neoplasm of skin and subcutaneous tissue of trunk: Secondary | ICD-10-CM | POA: Diagnosis not present

## 2019-06-02 DIAGNOSIS — K219 Gastro-esophageal reflux disease without esophagitis: Secondary | ICD-10-CM | POA: Diagnosis not present

## 2019-06-02 DIAGNOSIS — Z882 Allergy status to sulfonamides status: Secondary | ICD-10-CM | POA: Insufficient documentation

## 2019-06-02 HISTORY — PX: MASS EXCISION: SHX2000

## 2019-06-02 LAB — GLUCOSE, CAPILLARY
Glucose-Capillary: 111 mg/dL — ABNORMAL HIGH (ref 70–99)
Glucose-Capillary: 134 mg/dL — ABNORMAL HIGH (ref 70–99)

## 2019-06-02 SURGERY — EXCISION MASS
Anesthesia: General | Site: Abdomen | Laterality: Left

## 2019-06-02 MED ORDER — CEFAZOLIN SODIUM-DEXTROSE 2-4 GM/100ML-% IV SOLN
2.0000 g | INTRAVENOUS | Status: AC
  Administered 2019-06-02: 10:00:00 2 g via INTRAVENOUS
  Filled 2019-06-02: qty 100

## 2019-06-02 MED ORDER — FENTANYL CITRATE (PF) 100 MCG/2ML IJ SOLN
INTRAMUSCULAR | Status: DC | PRN
  Administered 2019-06-02: 50 ug via INTRAVENOUS
  Administered 2019-06-02: 25 ug via INTRAVENOUS

## 2019-06-02 MED ORDER — ACETAMINOPHEN 500 MG PO TABS
1000.0000 mg | ORAL_TABLET | Freq: Once | ORAL | Status: AC
  Administered 2019-06-02: 09:00:00 1000 mg via ORAL
  Filled 2019-06-02: qty 2

## 2019-06-02 MED ORDER — PHENYLEPHRINE 40 MCG/ML (10ML) SYRINGE FOR IV PUSH (FOR BLOOD PRESSURE SUPPORT)
PREFILLED_SYRINGE | INTRAVENOUS | Status: AC
  Filled 2019-06-02: qty 10

## 2019-06-02 MED ORDER — CHLORHEXIDINE GLUCONATE CLOTH 2 % EX PADS: 6.0000 | MEDICATED_PAD | Freq: Once | CUTANEOUS | Status: DC

## 2019-06-02 MED ORDER — PROPOFOL 10 MG/ML IV BOLUS
INTRAVENOUS | Status: DC | PRN
  Administered 2019-06-02: 150 mg via INTRAVENOUS

## 2019-06-02 MED ORDER — MIDAZOLAM HCL 2 MG/2ML IJ SOLN
INTRAMUSCULAR | Status: AC
  Filled 2019-06-02: qty 2

## 2019-06-02 MED ORDER — BUPIVACAINE-EPINEPHRINE 0.25% -1:200000 IJ SOLN
INTRAMUSCULAR | Status: DC | PRN
  Administered 2019-06-02: 20 mL

## 2019-06-02 MED ORDER — FENTANYL CITRATE (PF) 250 MCG/5ML IJ SOLN
INTRAMUSCULAR | Status: AC
  Filled 2019-06-02: qty 5

## 2019-06-02 MED ORDER — HYDROCODONE-ACETAMINOPHEN 5-325 MG PO TABS: 1.0000 | ORAL_TABLET | Freq: Four times a day (QID) | ORAL | 0 refills | Status: DC | PRN

## 2019-06-02 MED ORDER — PROPOFOL 500 MG/50ML IV EMUL
INTRAVENOUS | Status: DC | PRN
  Administered 2019-06-02: 25 ug/kg/min via INTRAVENOUS

## 2019-06-02 MED ORDER — BUPIVACAINE-EPINEPHRINE 0.5% -1:200000 IJ SOLN
INTRAMUSCULAR | Status: AC
  Filled 2019-06-02: qty 1

## 2019-06-02 MED ORDER — ONDANSETRON HCL 4 MG/2ML IJ SOLN
INTRAMUSCULAR | Status: DC | PRN
  Administered 2019-06-02: 4 mg via INTRAVENOUS

## 2019-06-02 MED ORDER — DEXAMETHASONE SODIUM PHOSPHATE 10 MG/ML IJ SOLN
INTRAMUSCULAR | Status: DC | PRN
  Administered 2019-06-02: 8 mg via INTRAVENOUS

## 2019-06-02 MED ORDER — BUPIVACAINE HCL (PF) 0.25 % IJ SOLN
INTRAMUSCULAR | Status: AC
  Filled 2019-06-02: qty 30

## 2019-06-02 MED ORDER — LACTATED RINGERS IV SOLN
INTRAVENOUS | Status: DC
  Administered 2019-06-02: 08:00:00 via INTRAVENOUS

## 2019-06-02 MED ORDER — MIDAZOLAM HCL 5 MG/5ML IJ SOLN
INTRAMUSCULAR | Status: DC | PRN
  Administered 2019-06-02: 2 mg via INTRAVENOUS

## 2019-06-02 MED ORDER — LIDOCAINE HCL (CARDIAC) PF 100 MG/5ML IV SOSY
PREFILLED_SYRINGE | INTRAVENOUS | Status: DC | PRN
  Administered 2019-06-02: 60 mg via INTRAVENOUS

## 2019-06-02 MED ORDER — HYDROMORPHONE HCL 1 MG/ML IJ SOLN: 0.2500 mg | INTRAMUSCULAR | Status: DC | PRN

## 2019-06-02 MED ORDER — 0.9 % SODIUM CHLORIDE (POUR BTL) OPTIME
TOPICAL | Status: DC | PRN
  Administered 2019-06-02: 1000 mL

## 2019-06-02 MED ORDER — PROPOFOL 10 MG/ML IV BOLUS
INTRAVENOUS | Status: AC
  Filled 2019-06-02: qty 20

## 2019-06-02 SURGICAL SUPPLY — 35 items
APL PRP STRL LF DISP 70% ISPRP (MISCELLANEOUS) ×1
APL SKNCLS STERI-STRIP NONHPOA (GAUZE/BANDAGES/DRESSINGS) ×1
BENZOIN TINCTURE PRP APPL 2/3 (GAUZE/BANDAGES/DRESSINGS) ×1 IMPLANT
BLADE CLIPPER SURG (BLADE) IMPLANT
CHLORAPREP W/TINT 26 (MISCELLANEOUS) ×1 IMPLANT
COVER SURGICAL LIGHT HANDLE (MISCELLANEOUS) ×2 IMPLANT
COVER WAND RF STERILE (DRAPES) ×2 IMPLANT
DRAPE LAPAROTOMY 100X72 PEDS (DRAPES) ×2 IMPLANT
DRSG TEGADERM 4X4.75 (GAUZE/BANDAGES/DRESSINGS) ×1 IMPLANT
ELECT REM PT RETURN 9FT ADLT (ELECTROSURGICAL) ×2
ELECTRODE REM PT RTRN 9FT ADLT (ELECTROSURGICAL) ×1 IMPLANT
GAUZE SPONGE 4X4 12PLY STRL (GAUZE/BANDAGES/DRESSINGS) IMPLANT
GLOVE BIO SURGEON STRL SZ7 (GLOVE) ×2 IMPLANT
GLOVE BIOGEL PI IND STRL 7.5 (GLOVE) ×1 IMPLANT
GLOVE BIOGEL PI INDICATOR 7.5 (GLOVE) ×1
GOWN STRL REUS W/ TWL LRG LVL3 (GOWN DISPOSABLE) ×2 IMPLANT
GOWN STRL REUS W/TWL LRG LVL3 (GOWN DISPOSABLE) ×4
KIT BASIN OR (CUSTOM PROCEDURE TRAY) ×2 IMPLANT
KIT TURNOVER KIT B (KITS) ×2 IMPLANT
NDL HYPO 25GX1X1/2 BEV (NEEDLE) ×1 IMPLANT
NEEDLE HYPO 25GX1X1/2 BEV (NEEDLE) ×2 IMPLANT
NS IRRIG 1000ML POUR BTL (IV SOLUTION) ×2 IMPLANT
PACK GENERAL/GYN (CUSTOM PROCEDURE TRAY) ×2 IMPLANT
PAD ARMBOARD 7.5X6 YLW CONV (MISCELLANEOUS) ×2 IMPLANT
PENCIL SMOKE EVACUATOR (MISCELLANEOUS) ×2 IMPLANT
SPECIMEN JAR SMALL (MISCELLANEOUS) ×2 IMPLANT
STRIP CLOSURE SKIN 1/2X4 (GAUZE/BANDAGES/DRESSINGS) ×1 IMPLANT
SUT MNCRL AB 4-0 PS2 18 (SUTURE) ×2 IMPLANT
SUT VIC AB 2-0 SH 27 (SUTURE) ×2
SUT VIC AB 2-0 SH 27X BRD (SUTURE) ×1 IMPLANT
SUT VIC AB 3-0 SH 27 (SUTURE) ×2
SUT VIC AB 3-0 SH 27XBRD (SUTURE) ×1 IMPLANT
SYR CONTROL 10ML LL (SYRINGE) ×2 IMPLANT
TOWEL GREEN STERILE (TOWEL DISPOSABLE) ×2 IMPLANT
TOWEL GREEN STERILE FF (TOWEL DISPOSABLE) ×2 IMPLANT

## 2019-06-02 NOTE — Discharge Instructions (Signed)
[facility] °Office Phone Number [phone] ° °Lipoma Excision: POST OP INSTRUCTIONS ° °Always review your discharge instruction sheet given to you by the facility where your surgery was performed. ° °IF YOU HAVE DISABILITY OR FAMILY LEAVE FORMS, YOU MUST BRING THEM TO THE OFFICE FOR PROCESSING.  DO NOT GIVE THEM TO YOUR DOCTOR. ° °1. A prescription for pain medication may be given to you upon discharge.  Take your pain medication as prescribed, if needed.  If narcotic pain medicine is not needed, then you may take acetaminophen (Tylenol) or ibuprofen (Advil) as needed. °2. Take your usually prescribed medications unless otherwise directed °3. If you need a refill on your pain medication, please contact your pharmacy.  They will contact our office to request authorization.  Prescriptions will not be filled after 5pm or on week-ends. °4. You should eat very light the first 24 hours after surgery, such as soup, crackers, pudding, etc.  Resume your normal diet the day after surgery. °5. Most patients will experience some swelling and bruising around the surgical site.  Ice packs will help.  Swelling and bruising can take several days to resolve.  °6. It is common to experience some constipation if taking pain medication after surgery.  Increasing fluid intake and taking a stool softener will usually help or prevent this problem from occurring.  A mild laxative (Milk of Magnesia or Miralax) should be taken according to package directions if there are no bowel movements after 48 hours. °7. You may remove your bandages 48 hours after surgery, and you may shower at that time.  You will have steri-strips (small skin tapes) in place directly over the incision.  These strips should be left on the skin for 7-10 days.   °8. ACTIVITIES:  You may resume regular daily activities (gradually increasing) beginning the next day.   You may have sexual intercourse when it is comfortable. °a. You may drive when you no  longer are taking prescription pain medication, you can comfortably wear a seatbelt, and you can safely maneuver your car and apply brakes. °b. RETURN TO WORK:  1-2 weeks °9. You should see your doctor in the office for a follow-up appointment approximately two to three weeks after your surgery.   ° °WHEN TO CALL YOUR DOCTOR: °1. Fever over 101.0 °2. Nausea and/or vomiting. °3. Extreme swelling or bruising. °4. Continued bleeding from incision. °5. Increased pain, redness, or drainage from the incision. ° °The clinic staff is available to answer your questions during regular business hours.  Please don’t hesitate to call and ask to speak to one of the nurses for clinical concerns.  If you have a medical emergency, go to the nearest emergency room or call 911.  A surgeon from [facility] is always on call at the hospital. ° °For further questions, please visit [email or web address]  ° ° °

## 2019-06-02 NOTE — H&P (Signed)
History of Present Illness  The patient is a 73 year old female who presents with an abdominal mass. PCP - Dr. Kathyrn Lass  This is a 73 year old female who presents with about a year history of some enlarging palpable masses in the left lower quadrant abdominal wall. These are causing some mild discomfort. This area has never become infected or inflamed. The patient has a family history of pancreatic cancer and has been followed in our office for pancreatic cyst. This has never been mentioned on previous imaging but my review of her MRI from last year shows that this mass may have been present in the subcutaneous tissue. She presents now to discuss excision because of the enlarging size as well as the discomfort associated with these masses.   Problem List/Past Medical  DIABETES MELLITUS, NEW ONSET (E11.9)  NUTRITIONAL COUNSELING (Z71.3)  MIDLINE BACK PAIN, UNSPECIFIED BACK LOCATION, UNSPECIFIED CHRONICITY (M54.89)  PERSISTENT INDIGESTION (K30)  FAMILY HISTORY OF PANCREATIC CANCER (Z80.0)  ABDOMINAL WALL MASS OF LEFT LOWER QUADRANT (R19.04)  LEFT-SIDED CHEST WALL PAIN (R07.89)  PANCREAS CYST (K86.2)   Past Surgical History Appendectomy  Hysterectomy (due to cancer) - Complete  Oral Surgery  Tonsillectomy   Diagnostic Studies History  Colonoscopy  5-10 years ago Mammogram  within last year  Allergies  Tobramycin *OPHTHALMIC AGENTS*  Hives. Erythromycin *DERMATOLOGICALS*  Nausea. Sulfur *DERMATOLOGICALS*  Allergies Reconciled   Medication History  ALPRAZolam (0.25MG  Tablet, Oral as needed) Active. Aspirin (81MG  Tablet, Oral) Active. Vitamin D (Cholecalciferol) (1000UNIT Capsule, Oral) Active. Potassium Chloride [facility] Tablet ER, Oral daily) Active. Furosemide (40MG  Tablet, Oral daily) Active. Gabapentin (300MG  Capsule, Oral) Active. Metoprolol Succinate ER (25MG  Tablet ER 24HR, Oral) Active. Omeprazole (10MG  Capsule DR, Oral)  Active. Rosuvastatin Calcium (5MG  Tablet, Oral) Active. Medications Reconciled  Social History  Alcohol use  Occasional alcohol use. No caffeine use  No drug use  Tobacco use  Former smoker.  Family History Arthritis  Father. Breast Cancer  Family Members In General. Cancer  Family Members In General. Cerebrovascular Accident  Family Members In General. Diabetes Mellitus  Family Members In General, Father. Heart Disease  Family Members In General, Father, Mother. Heart disease in female family member before age 1  Heart disease in female family member before age 39  Hypertension  Family Members In General, Mother. Malignant Neoplasm Of Pancreas  Brother, Family Members In General. Respiratory Condition  Family Members In General, Sister.  Pregnancy / Birth History  Age at menarche  41 years. Gravida  1 Length (months) of breastfeeding  3-6 Maternal age  2-35 Para  1  Other Problems Anxiety Disorder  Back Pain  Diabetes Mellitus  Gastroesophageal Reflux Disease  High blood pressure  Hypercholesterolemia  Other disease, cancer, significant illness  Thyroid Disease   Vitals  Weight: 155.8 lb Height: 67in Body Surface Area: 1.82 m Body Mass Index: 24.4 kg/m  Temp.: 97.85F  Pulse: 86 (Regular)  BP: 146/86(Sitting, Left Arm, Standard)       Physical Exam  The physical exam findings are as follows: Note: WDWN in NAD Eyes: Pupils equal, round; sclera anicteric HENT: Oral mucosa moist; good dentition Neck: No masses palpated, no thyromegaly Lungs: CTA bilaterally; normal respiratory effort CV: Regular rate and rhythm; no murmurs; extremities well-perfused with no edema Abd: +bowel sounds, soft, non-tender, no palpable organomegaly; no palpable hernias In the subcutaneous tissue in the left lower quadrant of the abdominal wall, there are two adjacent firm, smooth, palpable masses. The lower, larger mass measures  3x2 cm and  the more superior, more lateral mass measures 2.5 x 2 cm Skin: Warm, dry; no sign of jaundice Psychiatric - alert and oriented x 4; calm mood and affect    Assessment & Plan   ABDOMINAL WALL MASS OF LEFT LOWER QUADRANT (R19.04) Impression: Two - 3 x 2 cm and 2.5 x 2 cm - subcutaneous  Current Plans Schedule for Surgery - Excision of subcutaneous abdominal wall masses - left lower quadrant. The surgical procedure has been discussed with the patient. Potential risks, benefits, alternative treatments, and expected outcomes have been explained. All of the patient's questions at this time have been answered. The likelihood of reaching the patient's treatment goal is good. The patient understand the proposed surgical procedure and wishes to proceed. Note:The patient is concerned about the enlarging size of these masses as well as the tenderness associated with them. We will proceed with excision of these masses and we'll send this to pathology for diagnosis.   Imogene Burn. Georgette Dover, MD, [facility]  General/ Trauma Surgery   06/02/2019 9:40 AM

## 2019-06-02 NOTE — Transfer of Care (Signed)
Immediate Anesthesia Transfer of Care Note  Patient: Yvonne [name]  Procedure(s) Performed: EXCISION OF SUBCUTANEOUS ABDOMINAL WALL MASSES LEFT LOWER QUADRANT (Left Abdomen)  Patient Location: PACU  Anesthesia Type:General  Level of Consciousness: awake, alert  and oriented  Airway & Oxygen Therapy: Patient connected to face mask oxygen  Post-op Assessment: Post -op Vital signs reviewed and stable  Post vital signs: stable  Last Vitals:  Vitals Value Taken Time  BP 115/55 06/02/19 1026  Temp    Pulse 79 06/02/19 1028  Resp 13 06/02/19 1028  SpO2 98 % 06/02/19 1028  Vitals shown include unvalidated device data.  Last Pain:  Vitals:   06/02/19 0755  TempSrc: Oral  PainSc:          Complications: No apparent anesthesia complications

## 2019-06-02 NOTE — Op Note (Signed)
Preop diagnosis: Multiple subcutaneous mass left lower quadrant Postop diagnosis: Multiple] lipomas left lower quadrant Procedure performed: Excision of subcutaneous lipomatous left lower quadrant (2.5 cm or less) Surgeon:  Maia Petties Anesthesia: General:  Indications:  73 year old female who several enlarging firm palpable mass in the subcutaneous tissue of the left lower quadrant.  Causing discomfort.  She presents now for excision of this area for diagnosis.  Description of procedure: Patient was brought to the operating room and placed in position on the operating room table.  After an adequate level general anesthesia was obtained, the lower abdomen was prepped with ChloraPrep and draped in sterile fashion.  A timeout was taken to ensure the proper patient and proper procedure.  After palpating 2 or 3 firm masses in the left lower quadrant.  We anesthetized the area over the cyst with 0.25% Marcaine with epinephrine.  A transverse incision is made.  We dissected down through the dermis.  We encountered at least 3 separate each measuring about 2.5 cm.  I explored the wound in all directions.  I cannot palpate any other masses.  We inspected for hemostasis.  The wound was closed with 3-0 Vicryl and 4-0 Monocryl.  Benzoin and Steri-Strips were applied.  The patient was then extubated and brought to recovery room in stable condition.  All sponge, instrument, and needle counts are correct.  Imogene Burn. Georgette Dover, MD, [facility]  General/ Trauma Surgery   06/02/2019 10:27 AM

## 2019-06-02 NOTE — Anesthesia Postprocedure Evaluation (Signed)
Anesthesia Post Note  Patient: Yvonne [name]  Procedure(s) Performed: EXCISION OF SUBCUTANEOUS ABDOMINAL WALL MASSES LEFT LOWER QUADRANT (Left Abdomen)     Patient location during evaluation: PACU Anesthesia Type: General Level of consciousness: awake and alert Pain management: pain level controlled Vital Signs Assessment: post-procedure vital signs reviewed and stable Respiratory status: spontaneous breathing, nonlabored ventilation and respiratory function stable Cardiovascular status: blood pressure returned to baseline and stable Postop Assessment: no apparent nausea or vomiting Anesthetic complications: no    Last Vitals:  Vitals:   06/02/19 1045 06/02/19 1056  BP:  129/61  Pulse: 70 67  Resp: 12 11  Temp: (!) 36.1 C   SpO2: 98% 98%    Last Pain:  Vitals:   06/02/19 1056  TempSrc:   PainSc: 0-No pain                 Rickelle Sylvestre,W. EDMOND

## 2019-06-02 NOTE — Anesthesia Procedure Notes (Signed)
Procedure Name: LMA Insertion Date/Time: 06/02/2019 9:50 AM Performed by: Lavell Luster, CRNA Pre-anesthesia Checklist: Patient identified, Emergency Drugs available, Suction available, Patient being monitored and Timeout performed Patient Re-evaluated:Patient Re-evaluated prior to induction Oxygen Delivery Method: Circle system utilized Preoxygenation: Pre-oxygenation with 100% oxygen Induction Type: IV induction Ventilation: Mask ventilation without difficulty LMA: LMA inserted LMA Size: 4.0 Number of attempts: 1 Placement Confirmation: ETT inserted through vocal cords under direct vision,  positive ETCO2 and breath sounds checked- equal and bilateral Tube secured with: Tape Dental Injury: Teeth and Oropharynx as per pre-operative assessment

## 2019-06-03 ENCOUNTER — Encounter (HOSPITAL_COMMUNITY): Payer: Self-pay | Admitting: Surgery

## 2019-06-03 LAB — SURGICAL PATHOLOGY

## 2019-06-11 ENCOUNTER — Encounter

## 2019-06-11 ENCOUNTER — Ambulatory Visit: Payer: Medicare Other | Admitting: Neurology

## 2019-07-01 ENCOUNTER — Other Ambulatory Visit: Payer: Self-pay

## 2019-07-01 ENCOUNTER — Ambulatory Visit: Payer: Medicare Other | Admitting: Orthotics

## 2019-07-01 DIAGNOSIS — E1140 Type 2 diabetes mellitus with diabetic neuropathy, unspecified: Secondary | ICD-10-CM | POA: Diagnosis not present

## 2019-07-01 DIAGNOSIS — M2042 Other hammer toe(s) (acquired), left foot: Secondary | ICD-10-CM | POA: Diagnosis not present

## 2019-07-01 DIAGNOSIS — M2041 Other hammer toe(s) (acquired), right foot: Secondary | ICD-10-CM | POA: Diagnosis not present

## 2019-07-01 NOTE — Progress Notes (Signed)
PATIENT: Yvonne [name] DOB: 07-01-1946  REASON FOR VISIT: follow up HISTORY FROM: patient  HISTORY OF PRESENT ILLNESS: Today 07/02/19  [name] is a 73 year old female with history of diet-controlled diabetes and a diabetic neuropathy.  She remains on gabapentin 300 mg at bedtime.  She also has gabapentin 100 mg capsules to take during the day if needed.  She often does not take the daytime gabapentin, because she is caring for her grandchildren.  Recent A1c in October 2020 was 6.6.  She indicates her neuropathy is stable, continued numbness and tingling in both feet.  By nighttime her feet feel cold.  She will put an electric blanket in the bed before she gets in.  Recently, she has been seeing orthopedics for left hip bursitis and left sciatica.  She has had some numbness down the left leg.  She has received a steroid injection.  She remains very active.  She is controlling her diabetes with diet and exercise.  She has not had any changes to her gait or balance.  The bedtime gabapentin allows her to have a solid 5 hours of sleep.  She does notice that she has dreams.  She presents today for follow-up unaccompanied.  HISTORY 12/04/2018 Dr. Jannifer [name]: [name] is a 73 year old right-handed white female with a history of diet-controlled diabetes and a diabetic neuropathy.  The patient indicates that in the summer 2018 her hemoglobin A1c went up to 7.9, she lost 18 pounds of weight and brought her level down to 6.3.  The patient is not on any medications for diabetes.  She continues to have some symptoms from her neuropathy with a cold sensation in the feet.  She takes gabapentin 300 mg at night, she takes no medication during the daytime.  She usually sleeps fairly well, but sometimes she will wake up in the middle the night with cold sensations in the feet.  She uses a topical cream, she wears socks, she warms the bed before she gets in it.  She has bilateral knee arthritis requiring shots on  occasion.  She reports that her balance is good, she has not had any falls.  She is not bothered as much during the day with her feet as she is at night.  She denies any symptoms with the hands.  She returns to this office for an evaluation.   REVIEW OF SYSTEMS: Out of a complete 14 system review of symptoms, the patient complains only of the following symptoms, and all other reviewed systems are negative.  Numbness  ALLERGIES: Allergies  Allergen Reactions  . Tobramycin Hives  . Erythromycin Nausea Only  . Sulfur Nausea Only    HOME MEDICATIONS: Outpatient Medications Prior to Visit  Medication Sig Dispense Refill  . ALPRAZolam (XANAX) 0.25 MG tablet Take 0.25 mg by mouth daily as needed for anxiety.     Marland Kitchen aspirin 81 MG tablet Take 81 mg by mouth daily.      . Cholecalciferol (VITAMIN D) 50 MCG (2000 UT) tablet Take 2,000 Units by mouth daily.     . fluticasone (FLONASE) 50 MCG/ACT nasal spray Place 2 sprays into both nostrils daily as needed for allergies or rhinitis.     . furosemide (LASIX) 20 MG tablet Take 10 mg by mouth daily.    Marland Kitchen gabapentin (NEURONTIN) 100 MG capsule Take 100 mg by mouth 3 (three) times daily. PRN    . gabapentin (NEURONTIN) 300 MG capsule Take 300 mg by mouth at bedtime.     Marland Kitchen  levothyroxine (SYNTHROID, LEVOTHROID) 100 MCG tablet Take 100 mcg by mouth daily before breakfast.    . metoprolol succinate (TOPROL-XL) 25 MG 24 hr tablet TAKE 1/2 TABLET BY MOUTH DAILY. (Patient taking differently: Take 12.5 mg by mouth daily. ) 45 tablet 3  . metroNIDAZOLE (METROCREAM) 0.75 % cream Apply 1 application topically daily.     Marland Kitchen omeprazole (PRILOSEC) 20 MG capsule Take 20 mg by mouth daily as needed (indigestion).     . potassium chloride SA (K-DUR,KLOR-CON) 20 MEQ tablet TAKE 1/2 TABLET BY MOUTH DAILY. (Patient taking differently: Take 10 mEq by mouth daily. ) 45 tablet 3  . Prenatal Vit-Fe Fumarate-FA (PRENATAL MULTIVITAMIN) TABS tablet Take 1 tablet by mouth daily at 12  noon.    . rosuvastatin (CRESTOR) 5 MG tablet Take 1 tablet (5 mg total) by mouth daily. 90 tablet 3  . HYDROcodone-acetaminophen (NORCO/VICODIN) 5-325 MG tablet Take 1 tablet by mouth every 6 (six) hours as needed for moderate pain. (Patient not taking: Reported on 07/02/2019) 15 tablet 0   No facility-administered medications prior to visit.     PAST MEDICAL HISTORY: Past Medical History:  Diagnosis Date  . Anxiety   . Chest pressure   . Degenerative arthritis   . Diabetes mellitus without complication (HCC)    diet controlled  . Diabetic neuropathy ([place]) 12/03/2017  . Heart palpitations   . History of diastolic dysfunction    per echo in 2011  . Hyperlipidemia   . Hypertension   . Hypothyroidism   . LVH (left ventricular hypertrophy)    per echo in 2011  . Normal nuclear stress test 2011  . Polyneuropathy in other diseases classified elsewhere ([place]) 05/21/2013  . Sleep difficulties   . Tinnitus     PAST SURGICAL HISTORY: Past Surgical History:  Procedure Laterality Date  . ABDOMINAL HYSTERECTOMY    . APPENDECTOMY    . CARDIOVASCULAR STRESS TEST  11/09/2009   EF 75%  . CARPAL TUNNEL RELEASE     bilateral  . COLONOSCOPY WITH PROPOFOL N/A 10/23/2018   Procedure: COLONOSCOPY WITH PROPOFOL;  Surgeon: [name], MD;  Location: [facility];  Service: Endoscopy;  Laterality: N/A;  . ESOPHAGOGASTRODUODENOSCOPY (EGD) WITH PROPOFOL N/A 10/23/2018   Procedure: ESOPHAGOGASTRODUODENOSCOPY (EGD) WITH PROPOFOL;  Surgeon: [name], MD;  Location: [facility];  Service: Endoscopy;  Laterality: N/A;  . EUS N/A 10/23/2018   Procedure: UPPER ENDOSCOPIC ULTRASOUND (EUS) RADIAL;  Surgeon: [name], MD;  Location: [facility];  Service: Endoscopy;  Laterality: N/A;  . MASS EXCISION Left 06/02/2019   Procedure: EXCISION OF SUBCUTANEOUS ABDOMINAL WALL MASSES LEFT LOWER QUADRANT;  Surgeon: [name], MD;  Location: [facility];  Service: General;  Laterality: Left;  .  TRANSTHORACIC ECHOCARDIOGRAM  11/01/2009   NORMAL LV FILLING AND DIASTOLIC DYSFUNCTION AND MILD AORTIC SCLEROSIS AND TRACE MITRAL REGURGITATION    FAMILY HISTORY: Family History  Problem Relation Age of Onset  . Hypertension Mother   . Heart disease Mother   . Dementia Mother   . Heart disease Father   . Arthritis Father   . Diabetes Father   . Hypertension Father   . Cancer Brother 60       pancreatic  . Other Brother        automobile accident  . Stroke Maternal Grandmother   . Heart disease Maternal Grandfather   . Cancer Paternal Aunt        malignant brain cancer  . Cancer Other 49  pancreatic neuroendocrine carcinoma  . Cancer Cousin 64       pancreatic  . Cancer Cousin 60       pancreatic  . Cancer Cousin 52       breast  . Cancer Cousin 67       uterine  . Cancer Cousin 75       unknown primary  . Colon cancer Neg Hx     SOCIAL HISTORY: Social History   Socioeconomic History  . Marital status: Married    Spouse name: Edd Fabian  . Number of children: 1  . Years of education: COLLEGE  . Highest education level: Not on file  Occupational History  . Occupation: Retired  Scientific laboratory technician  . Financial resource strain: Not on file  . Food insecurity    Worry: Not on file    Inability: Not on file  . Transportation needs    Medical: Not on file    Non-medical: Not on file  Tobacco Use  . Smoking status: Former Smoker    Packs/day: 0.50    Years: 10.00    Pack years: 5.00    Quit date: 08/14/1975    Years since quitting: 43.9  . Smokeless tobacco: Never Used  Substance and Sexual Activity  . Alcohol use: Yes    Comment: one glass of wine a week  . Drug use: No  . Sexual activity: Not on file  Lifestyle  . Physical activity    Days per week: Not on file    Minutes per session: Not on file  . Stress: Not on file  Relationships  . Social Herbalist on phone: Not on file    Gets together: Not on file    Attends religious service: Not on  file    Active member of club or organization: Not on file    Attends meetings of clubs or organizations: Not on file    Relationship status: Not on file  . Intimate partner violence    Fear of current or ex partner: Not on file    Emotionally abused: Not on file    Physically abused: Not on file    Forced sexual activity: Not on file  Other Topics Concern  . Not on file  Social History Narrative   Lives at home w/ her husband, Edd Fabian.   Right-handed.   Drinks no caffeine but does eat chocolate occasionally.   epworth sleepiness scale = 3 (01/10/16)   PHYSICAL EXAM  Vitals:   07/02/19 0852  BP: 117/70  Pulse: 75  Temp: (!) 97.4 F (36.3 C)  Weight: 156 lb 9.6 oz (71 kg)  Height: 5' 6.5" (1.689 m)   Body mass index is 24.9 kg/m.  Generalized: Well developed, in no acute distress   Neurological examination  Mentation: Alert oriented to time, place, history taking. Follows all commands speech and language fluent Cranial nerve II-XII: Pupils were equal round reactive to light. Extraocular movements were full, visual field were full on confrontational test. Facial sensation and strength were normal.  Head turning and shoulder shrug  were normal and symmetric. Motor: The motor testing reveals 5 over 5 strength of all 4 extremities. Good symmetric motor tone is noted throughout.  Sensory: Stocking pattern sensory deficit to lower extremities, is more decreased on the left to pinprick and vibration Coordination: Cerebellar testing reveals good finger-nose-finger and heel-to-shin bilaterally.  Gait and station: Gait is normal. Tandem gait is normal. Romberg is negative. No drift is seen.  Reflexes: Deep tendon reflexes are symmetric and normal bilaterally.   DIAGNOSTIC DATA (LABS, IMAGING, TESTING) - I reviewed patient records, labs, notes, testing and imaging myself where available.  Lab Results  Component Value Date   WBC 5.2 05/26/2019   HGB 13.9 05/26/2019   HCT 41.7  05/26/2019   MCV 86.5 05/26/2019   PLT 191 05/26/2019      Component Value Date/Time   NA 138 05/26/2019 0937   K 4.3 05/26/2019 0937   CL 103 05/26/2019 0937   CO2 24 05/26/2019 0937   GLUCOSE 243 (H) 05/26/2019 0937   BUN 12 05/26/2019 0937   CREATININE 0.68 05/26/2019 0937   CREATININE 0.74 09/19/2018 0738   CREATININE 0.71 01/04/2016 0739   CALCIUM 9.2 05/26/2019 0937   PROT 7.3 09/19/2018 0738   PROT 6.8 05/21/2013 0900   ALBUMIN 4.3 09/19/2018 0738   AST 18 09/19/2018 0738   ALT 24 09/19/2018 0738   ALKPHOS 59 09/19/2018 0738   BILITOT 0.8 09/19/2018 0738   GFRNONAA >60 05/26/2019 0937   GFRNONAA >60 09/19/2018 0738   GFRAA >60 05/26/2019 0937   GFRAA >60 09/19/2018 0738   Lab Results  Component Value Date   CHOL 134 01/04/2016   HDL 39 (L) 01/04/2016   LDLCALC 77 01/04/2016   TRIG 91 01/04/2016   CHOLHDL 3.4 01/04/2016   Lab Results  Component Value Date   HGBA1C 6.6 (H) 05/26/2019   Lab Results  Component Value Date   VITAMINB12 546 09/19/2018   Lab Results  Component Value Date   TSH 0.72 01/04/2016      ASSESSMENT AND PLAN 73 y.o. year old female  has a past medical history of Anxiety, Chest pressure, Degenerative arthritis, Diabetes mellitus without complication ([place]), Diabetic neuropathy ([place]) (12/03/2017), Heart palpitations, History of diastolic dysfunction, Hyperlipidemia, Hypertension, Hypothyroidism, LVH (left ventricular hypertrophy), Normal nuclear stress test (2011), Polyneuropathy in other diseases classified elsewhere ([place]) (05/21/2013), Sleep difficulties, and Tinnitus. here with:  1.  Peripheral neuropathy  She has continued to do well.  Her recent A1c was 6.6.  She will remain on gabapentin 300 mg at bedtime (filled by her primary), I will refill gabapentin 100 mg capsules 3 times daily as needed.  We did discuss we may try Cymbalta in the future, as she is hesitant to take daytime gabapentin due to caring for her grandchildren.  The  Cymbalta may also be helpful for her sciatica. She will follow-up in 6 months or sooner if needed.  I did advise if her symptoms worsen or she develops any new symptoms she should let us know.  I spent 15 minutes with the patient. 50% of this time was spent discussing her plan of care.  Butler Denmark, AGNP-C, DNP 07/02/2019, 9:04 AM [facility] [address], [place], [place] [address] [phone]

## 2019-07-02 ENCOUNTER — Encounter: Payer: Self-pay | Admitting: Neurology

## 2019-07-02 ENCOUNTER — Encounter

## 2019-07-02 ENCOUNTER — Ambulatory Visit: Payer: Medicare Other | Admitting: Neurology

## 2019-07-02 VITALS — BP 117/70 | HR 75 | Temp 97.4°F | Ht 66.5 in | Wt 156.6 lb

## 2019-07-02 DIAGNOSIS — E1142 Type 2 diabetes mellitus with diabetic polyneuropathy: Secondary | ICD-10-CM

## 2019-07-02 MED ORDER — GABAPENTIN 100 MG PO CAPS: 100.0000 mg | ORAL_CAPSULE | Freq: Three times a day (TID) | ORAL | 1 refills | Status: DC

## 2019-07-02 NOTE — Progress Notes (Signed)
I have read the note, and I agree with the clinical assessment and plan.  Neve Branscomb K Korina Tretter   

## 2019-07-02 NOTE — Patient Instructions (Signed)
I will refill your gabapentin. Consider Cymbalta for neuropathy pain. Please follow-up with orthopedics.

## 2019-07-17 ENCOUNTER — Telehealth: Payer: Self-pay | Admitting: Podiatry

## 2019-07-17 NOTE — Telephone Encounter (Signed)
Pt called and had questions about the diabetic shoes she got a couple of weeks ago. She was asking if she could only wear the diabetic shoes what if she wanted to wear a different shoe. I explained taht she could wear different shoes that the reason she got the diabetic shoes and inserts is to help in reducing the calluses and protect her feet. She asked if she decided she did not like the shoes and inserts if she could return them. I explained that she could return the shoes if not soiled or worn outside but the inserts are custom made for her and cannot be returned.  She also asked if she could wear the insert in different shoes and I explained that the shoes she got from [name] is deeper to accommodate the thicker  Inserts to help protect the feet. And if she put them in different shoes they may make them to tight and rub the tops of her feet causing pain and possible sores. That is why she was told not to put in other shoes. Pt stated she was going to try the shoes for a week or so and may want to return them.

## 2019-07-28 ENCOUNTER — Ambulatory Visit: Payer: Medicare Other | Admitting: Podiatry

## 2019-07-28 ENCOUNTER — Encounter: Payer: Self-pay | Admitting: Podiatry

## 2019-07-28 ENCOUNTER — Other Ambulatory Visit: Payer: Self-pay

## 2019-07-28 DIAGNOSIS — E1142 Type 2 diabetes mellitus with diabetic polyneuropathy: Secondary | ICD-10-CM | POA: Diagnosis not present

## 2019-07-29 NOTE — Progress Notes (Signed)
She presents today for follow-up of her neuropathy she states that she seems to be doing pretty well she is still taking 300 mg of the gabapentin at night.  States that it works sometimes but not others.  States that her blood sugar is in a constant flux last A1c was at 6.7 and still does not know whether or not she actually wants to take Metformin which is been offered by her primary care provider.  She is asking me today if taking Metformin would help reduce the symptoms.  ROS: Denies fever chills nausea vomiting muscle aches pains calf pain back pain chest pain shortness of breath.  Denies changes in her past medical history medications allergies surgeries and social history.  Objective: Vital signs are stable she is alert oriented x3 clinically there is no change on physical exam pulses remain palpable neurologic sensorium is intact skin is dry and intact with exception of a small fissure to the hallux left demonstrating no signs of infection.  Assessment: Diabetic with diabetic peripheral neuropathy  Plan: Discussed etiology pathology and surgical therapies at this point were to continue 300 mg nightly and I recommended that she follow-up with her primary care provider and have her diabetes managed in more in the case consistent way that which the patient is managing it presently I feel that keeping a constant A1c around the six 6.2 will probably help her in the long run to prevent increase in polyneuropathy symptoms.  Follow-up with her in 6 months

## 2019-08-10 ENCOUNTER — Telehealth: Payer: Self-pay | Admitting: Neurology

## 2019-08-10 NOTE — Telephone Encounter (Signed)
Patient called in requesting a call back from Judson Roch , she states she just has a question for her.

## 2019-08-10 NOTE — Telephone Encounter (Signed)
I have personally not heard of this medication, Dr Jannifer Franklin may have, he is out of the office. A quick search in up to date and [facility] does not have this product listed at all. Google tells me is made of vitamin b, R-alpha lipoic acid, vitamin d, fever few. It is not certified by the FDA. I would use caution if she wants to try it, I am sure insurance would not cover it. She can check the her pharmacist to make sure no significant interaction with her other meds. I remember she didn't want to take any sedating meds during the day because she has her grand kids. I doubt this would sedate her.

## 2019-08-10 NOTE — Telephone Encounter (Signed)
I called pt and she had question if we have heard of Nerve Renew by Dr. Lowanda Foster. For neuropathy.  (looks like from googe B12 supplement).  She asked if Dr. Jannifer Franklin or you had heard of it?

## 2019-08-11 NOTE — Telephone Encounter (Signed)
I called pt and relayed per SS/NP the message relating to the OTC the she was asking about.  She asked if any other VITamins/minerals would be of benefit to her and the neuropathy that she suffers with.  If so will call her back.  She takes a MVI.

## 2019-08-11 NOTE — Telephone Encounter (Signed)
I think a MVI is sufficient. She has a diabetic neuropathy, so she certainly needs good control of her blood sugars. If she wants to try the other supplement she may, if no interaction with other meds, not sure how helpful it would be.

## 2019-08-11 NOTE — Telephone Encounter (Signed)
I relayed that would call if any new changes, since none will see her in May 2021, she has appt with Dr Burr Medico in 09/2019 who has checked her labs in past

## 2019-09-15 ENCOUNTER — Telehealth: Payer: Self-pay | Admitting: Hematology

## 2019-09-15 NOTE — Telephone Encounter (Signed)
Rescheduled per 2/1 sch msg, pt req. Called and spoke with pt, confirmed 3/1 and 3/3 appts

## 2019-09-21 ENCOUNTER — Inpatient Hospital Stay: Payer: Medicare Other | Admitting: Hematology

## 2019-09-21 ENCOUNTER — Inpatient Hospital Stay: Payer: Medicare Other

## 2019-10-06 ENCOUNTER — Other Ambulatory Visit: Payer: Self-pay | Admitting: Hematology

## 2019-10-06 DIAGNOSIS — K862 Cyst of pancreas: Secondary | ICD-10-CM

## 2019-10-07 ENCOUNTER — Telehealth: Payer: Self-pay | Admitting: Hematology

## 2019-10-07 NOTE — Telephone Encounter (Signed)
R/s appt per 2/24 sch message - pt is aware of appt date and time  

## 2019-10-12 ENCOUNTER — Inpatient Hospital Stay: Payer: Medicare Other

## 2019-10-14 ENCOUNTER — Inpatient Hospital Stay: Payer: Medicare Other | Admitting: Hematology

## 2019-10-20 NOTE — Progress Notes (Addendum)
[place]   Telephone:([phone]) [phone] [phone]   Clinic Follow up Note   Patient Care Team: Kathyrn Lass, MD as PCP - General (Family Medicine) Garrel Ridgel, DPM as Consulting Physician (Podiatry) Darlin Coco, MD as Consulting Physician (Cardiology) [name], MD as Attending Physician (Gastroenterology)  Date of Service:  10/28/2019  CHIEF COMPLAINT: Family history of pancreatic cancer, high risk    INTERVAL HISTORY:  Yvonne [name] is here for a follow up. She was last seen be me 1 year ago. She presents to the clinic alone. She notes she is doing well. She has received both her COVID19 vaccines. She denies abdominal issues, pain, bloating or loss of appetite. Her weight is stable. She notes her HB A1c has fluctuated up to 7 range over the last 10 years. She notes her most recent was 6.7 in 07/2019. She has not been on medication and dos not want to if she can help it. I reviewed her medication list with her.     REVIEW OF SYSTEMS:   Constitutional: Denies fevers, chills or abnormal weight loss Eyes: Denies blurriness of vision Ears, nose, mouth, throat, and face: Denies mucositis or sore throat Respiratory: Denies cough, dyspnea or wheezes Cardiovascular: Denies palpitation, chest discomfort or lower extremity swelling Gastrointestinal:  Denies nausea, heartburn or change in bowel habits Skin: Denies abnormal skin rashes Lymphatics: Denies new lymphadenopathy or easy bruising Neurological:Denies numbness, tingling or new weaknesses Behavioral/Psych: Mood is stable, no new changes  All other systems were reviewed with the patient and are negative.  MEDICAL HISTORY:  Past Medical History:  Diagnosis Date  . Anxiety   . Chest pressure   . Degenerative arthritis   . Diabetes mellitus without complication (HCC)    diet controlled  . Diabetic neuropathy ([place]) 12/03/2017  . Heart palpitations   . History of diastolic dysfunction    per  echo in 2011  . Hyperlipidemia   . Hypertension   . Hypothyroidism   . LVH (left ventricular hypertrophy)    per echo in 2011  . Normal nuclear stress test 2011  . Polyneuropathy in other diseases classified elsewhere ([place]) 05/21/2013  . Sleep difficulties   . Tinnitus     SURGICAL HISTORY: Past Surgical History:  Procedure Laterality Date  . ABDOMINAL HYSTERECTOMY    . APPENDECTOMY    . CARDIOVASCULAR STRESS TEST  11/09/2009   EF 75%  . CARPAL TUNNEL RELEASE     bilateral  . COLONOSCOPY WITH PROPOFOL N/A 10/23/2018   Procedure: COLONOSCOPY WITH PROPOFOL;  Surgeon: [name], MD;  Location: [facility];  Service: Endoscopy;  Laterality: N/A;  . ESOPHAGOGASTRODUODENOSCOPY (EGD) WITH PROPOFOL N/A 10/23/2018   Procedure: ESOPHAGOGASTRODUODENOSCOPY (EGD) WITH PROPOFOL;  Surgeon: [name], MD;  Location: [facility];  Service: Endoscopy;  Laterality: N/A;  . EUS N/A 10/23/2018   Procedure: UPPER ENDOSCOPIC ULTRASOUND (EUS) RADIAL;  Surgeon: [name], MD;  [facility];  Service: Endoscopy;  Laterality: N/A;  . MASS EXCISION Left 06/02/2019   Procedure: EXCISION OF SUBCUTANEOUS ABDOMINAL WALL MASSES LEFT LOWER QUADRANT;  Surgeon: [name], MD;  Location: [facility];  Service: General;  Laterality: Left;  . TRANSTHORACIC ECHOCARDIOGRAM  11/01/2009   NORMAL LV FILLING AND DIASTOLIC DYSFUNCTION AND MILD AORTIC SCLEROSIS AND TRACE MITRAL REGURGITATION    I have reviewed the social history and family history with the patient and they are unchanged from previous note.  ALLERGIES:  is allergic to tobramycin; erythromycin; and  sulfur.  MEDICATIONS:  Current Outpatient Medications  Medication Sig Dispense Refill  . acetaminophen (TYLENOL) 325 MG tablet Take 650 mg by mouth every 6 (six) hours as needed.    . ALPRAZolam (XANAX) 0.25 MG tablet Take 0.25 mg by mouth daily as needed for anxiety.     Marland Kitchen aspirin 81 MG tablet Take 81 mg by mouth daily.      .  Cholecalciferol (VITAMIN D) 50 MCG (2000 UT) tablet Take 2,000 Units by mouth daily.     . fluticasone (FLONASE) 50 MCG/ACT nasal spray Place 2 sprays into both nostrils daily as needed for allergies or rhinitis.     . furosemide (LASIX) 20 MG tablet Take 10 mg by mouth daily.    Marland Kitchen gabapentin (NEURONTIN) 100 MG capsule Take 1 capsule (100 mg total) by mouth 3 (three) times daily. PRN 90 capsule 1  . gabapentin (NEURONTIN) 300 MG capsule Take 300 mg by mouth at bedtime.     . Influenza Vac High-Dose Quad (FLUZONE HIGH-DOSE QUADRIVALENT) 0.7 ML SUSY Fluzone High-Dose Quad 2020-21 (PF) 240 mcg/0.7 mL IM syringe  PHARMACY ADMINISTERED    . levothyroxine (SYNTHROID, LEVOTHROID) 100 MCG tablet Take 100 mcg by mouth daily before breakfast.    . metoprolol succinate (TOPROL-XL) 25 MG 24 hr tablet TAKE 1/2 TABLET BY MOUTH DAILY. (Patient taking differently: Take 12.5 mg by mouth daily. ) 45 tablet 3  . metroNIDAZOLE (METROCREAM) 0.75 % cream Apply 1 application topically daily.     . potassium chloride SA (K-DUR,KLOR-CON) 20 MEQ tablet TAKE 1/2 TABLET BY MOUTH DAILY. (Patient taking differently: Take 10 mEq by mouth daily. ) 45 tablet 3  . Prenatal Vit-Fe Fumarate-FA (PRENATAL MULTIVITAMIN) TABS tablet Take 1 tablet by mouth daily at 12 noon.    . rosuvastatin (CRESTOR) 5 MG tablet Take 1 tablet (5 mg total) by mouth daily. 90 tablet 3   No current facility-administered medications for this visit.    PHYSICAL EXAMINATION: ECOG PERFORMANCE STATUS: 0 - Asymptomatic  Vitals:   10/28/19 1204  BP: (!) 148/72  Pulse: 77  Resp: 17  Temp: 98 F (36.7 C)  SpO2: 99%   Filed Weights   10/28/19 1204  Weight: 156 lb 14.4 oz (71.2 kg)    GENERAL:alert, no distress and comfortable SKIN: skin color, texture, turgor are normal, no rashes or significant lesions EYES: normal, Conjunctiva are pink and non-injected, sclera clear  NECK: supple, thyroid normal size, non-tender, without nodularity LYMPH:  no  palpable lymphadenopathy in the cervical, axillary  LUNGS: clear to auscultation and percussion with normal breathing effort HEART: regular rate & rhythm and no murmurs and no lower extremity edema ABDOMEN:abdomen soft, non-tender and normal bowel sounds Musculoskeletal:no cyanosis of digits and no clubbing  NEURO: alert & oriented x 3 with fluent speech, no focal motor/sensory deficits  LABORATORY DATA:  I have reviewed the data as listed CBC Latest Ref Rng & Units 10/23/2019 05/26/2019 09/19/2018  WBC 4.0 - 10.5 K/uL 6.3 5.2 6.1  Hemoglobin 12.0 - 15.0 g/dL 13.3 13.9 14.0  Hematocrit 36.0 - 46.0 % 38.6 41.7 41.3  Platelets 150 - 400 K/uL 192 191 181     CMP Latest Ref Rng & Units 10/23/2019 05/26/2019 09/19/2018  Glucose 70 - 99 mg/dL 152(H) 243(H) 130(H)  BUN 8 - 23 mg/dL '12 12 12  ' Creatinine 0.44 - 1.00 mg/dL 0.76 0.68 0.74  Sodium 135 - 145 mmol/L 140 138 141  Potassium 3.5 - 5.1 mmol/L 4.1 4.3 4.0  Chloride  98 - 111 mmol/L 104 103 105  CO2 22 - 32 mmol/L '28 24 27  ' Calcium 8.9 - 10.3 mg/dL 8.9 9.2 9.4  Total Protein 6.5 - 8.1 g/dL 6.7 - 7.3  Total Bilirubin 0.3 - 1.2 mg/dL 0.5 - 0.8  Alkaline Phos 38 - 126 U/L 57 - 59  AST 15 - 41 U/L 14(L) - 18  ALT 0 - 44 U/L 17 - 24      RADIOGRAPHIC STUDIES: I have personally reviewed the radiological images as listed and agreed with the findings in the report. No results found.   ASSESSMENT & PLAN:  Yvonne [name] is a 74 y.o. female with    1. Strong family history of pancreatic cancer, negative genetic testing -She has a very strong family history of malignancy, especially pancreatic cancer.  Her brother died of pancreatic cancer at age of 72, and 2 of her maternal cousins had breast cancer in their 88s.  She also has family history of breast cancer, uterine cancer, brain tumor and pancreatic neuroendocrine tumor.  -Her extensive genetic testing, including BRCA1, BRCA2, and Lynch syndrome, was negative, we reviewed. -She is  certainly at high risk for pancreatic cancer, due to her strong family history.  Although she does not meet the criteria for familial pancreatic cancer -According to NCCN guideline, she is considered high risk individual and qualify for pancreatic cancer screening, with MRI/MRCP or EUS annually.   -Her screening colonoscopy and endoscopy with Dr. Ardis Hughs in 10/2018 was normal except stable pancreatic cyst  -I personally reviewed her MRI abdomen from 10/26/19 which shows 10cm cystic of her pancreatic tail, most likely benign. No other concerning features. Labs reviewed, CBC and CMP WNL except BG 152. CA 19.9 has remained normal. Overall no indication for pancreatic cancer.  -Will continue surveillance. Repeat endoscopy in 2022 and repeat MRI in 2023.  -f/u in 2 years. She will f/u with Dr. Ardis Hughs in interim.  -She has received both her [place] vaccinations. I encouraged her to practice deep breathing daily. I advised her to watch for uncontrolled BG level, significant abdominal pain, and jaundice.     2. Diabetic polyneuropathy associated with type 2 diabetes mellitus -On 100 mg gabapentin. I discussed she can also use Vit B and exercise.  -She notes her A1c has fluctuated up to highest 7 range in the past 10 years. Her latest A1c at 6.7 in 07/2019.  -I recommend if she wants to avoid medication treatment she can work on losing weight with healthy diet high in protein and exercise. She should check her BG before breakfast after lunch and after dinner daily.  -I recommend she f/u with her PCP for her other health management.    Plan  -lab and MRI reviewed, no concern for pancreatic cancer  -F/u in 2 years with lab and MRI abdomen a few days before. She will f/u with Dr. Ardis Hughs in interim for next endoscopy in 2021.     No problem-specific Assessment & Plan notes found for this encounter.   Orders Placed This Encounter  Procedures  . MR ABDOMEN MRCP W WO CONTAST    Standing Status:   Future     Standing Expiration Date:   10/27/2021    Order Specific Question:   If indicated for the ordered procedure, I authorize the administration of contrast media per Radiology protocol    Answer:   Yes    Order Specific Question:   What is the patient's sedation requirement?  Answer:   No Sedation    Order Specific Question:   Does the patient have a pacemaker or implanted devices?    Answer:   No    Order Specific Question:   Release to patient    Answer:   Immediate    Order Specific Question:   Radiology Contrast Protocol - do NOT remove file path    Answer:   [email or web address]    Order Specific Question:   Preferred imaging location?    Answer:   [facility] (table limit-350 lbs)   All questions were answered. The patient knows to call the clinic with any problems, questions or concerns. No barriers to learning was detected. The total time spent in the appointment was 30 minutes.     Truitt Merle, MD 10/28/2019   I, Joslyn Devon, am acting as scribe for Truitt Merle, MD.   I have reviewed the above documentation for accuracy and completeness, and I agree with the above.

## 2019-10-23 ENCOUNTER — Other Ambulatory Visit: Payer: Self-pay | Admitting: Hematology

## 2019-10-23 ENCOUNTER — Other Ambulatory Visit: Payer: Self-pay | Admitting: *Deleted

## 2019-10-23 ENCOUNTER — Inpatient Hospital Stay: Payer: Medicare PPO | Attending: Hematology

## 2019-10-23 ENCOUNTER — Other Ambulatory Visit: Payer: Self-pay

## 2019-10-23 DIAGNOSIS — F419 Anxiety disorder, unspecified: Secondary | ICD-10-CM | POA: Insufficient documentation

## 2019-10-23 DIAGNOSIS — Z79899 Other long term (current) drug therapy: Secondary | ICD-10-CM | POA: Diagnosis not present

## 2019-10-23 DIAGNOSIS — Z8049 Family history of malignant neoplasm of other genital organs: Secondary | ICD-10-CM | POA: Diagnosis not present

## 2019-10-23 DIAGNOSIS — E1142 Type 2 diabetes mellitus with diabetic polyneuropathy: Secondary | ICD-10-CM | POA: Insufficient documentation

## 2019-10-23 DIAGNOSIS — E785 Hyperlipidemia, unspecified: Secondary | ICD-10-CM | POA: Diagnosis not present

## 2019-10-23 DIAGNOSIS — Z803 Family history of malignant neoplasm of breast: Secondary | ICD-10-CM | POA: Diagnosis not present

## 2019-10-23 DIAGNOSIS — Z7982 Long term (current) use of aspirin: Secondary | ICD-10-CM | POA: Insufficient documentation

## 2019-10-23 DIAGNOSIS — K862 Cyst of pancreas: Secondary | ICD-10-CM

## 2019-10-23 DIAGNOSIS — Z800 Family history of malignant neoplasm of digestive organs: Secondary | ICD-10-CM | POA: Diagnosis not present

## 2019-10-23 DIAGNOSIS — E039 Hypothyroidism, unspecified: Secondary | ICD-10-CM | POA: Diagnosis not present

## 2019-10-23 DIAGNOSIS — G629 Polyneuropathy, unspecified: Secondary | ICD-10-CM | POA: Diagnosis not present

## 2019-10-23 DIAGNOSIS — Z1509 Genetic susceptibility to other malignant neoplasm: Secondary | ICD-10-CM | POA: Insufficient documentation

## 2019-10-23 LAB — CBC WITH DIFFERENTIAL (CANCER CENTER ONLY)
Abs Immature Granulocytes: 0.02 10*3/uL (ref 0.00–0.07)
Basophils Absolute: 0 10*3/uL (ref 0.0–0.1)
Basophils Relative: 1 %
Eosinophils Absolute: 0.3 10*3/uL (ref 0.0–0.5)
Eosinophils Relative: 5 %
HCT: 38.6 % (ref 36.0–46.0)
Hemoglobin: 13.3 g/dL (ref 12.0–15.0)
Immature Granulocytes: 0 %
Lymphocytes Relative: 28 %
Lymphs Abs: 1.8 10*3/uL (ref 0.7–4.0)
MCH: 28.5 pg (ref 26.0–34.0)
MCHC: 34.5 g/dL (ref 30.0–36.0)
MCV: 82.7 fL (ref 80.0–100.0)
Monocytes Absolute: 0.6 10*3/uL (ref 0.1–1.0)
Monocytes Relative: 10 %
Neutro Abs: 3.5 10*3/uL (ref 1.7–7.7)
Neutrophils Relative %: 56 %
Platelet Count: 192 10*3/uL (ref 150–400)
RBC: 4.67 MIL/uL (ref 3.87–5.11)
RDW: 13.5 % (ref 11.5–15.5)
WBC Count: 6.3 10*3/uL (ref 4.0–10.5)
nRBC: 0 % (ref 0.0–0.2)

## 2019-10-23 LAB — CMP (CANCER CENTER ONLY)
ALT: 17 U/L (ref 0–44)
AST: 14 U/L — ABNORMAL LOW (ref 15–41)
Albumin: 4.1 g/dL (ref 3.5–5.0)
Alkaline Phosphatase: 57 U/L (ref 38–126)
Anion gap: 8 (ref 5–15)
BUN: 12 mg/dL (ref 8–23)
CO2: 28 mmol/L (ref 22–32)
Calcium: 8.9 mg/dL (ref 8.9–10.3)
Chloride: 104 mmol/L (ref 98–111)
Creatinine: 0.76 mg/dL (ref 0.44–1.00)
GFR, Est AFR Am: >60 mL/min (ref >60)
GFR, Estimated: >60 mL/min (ref >60)
Glucose, Bld: 152 mg/dL — ABNORMAL HIGH (ref 70–99)
Potassium: 4.1 mmol/L (ref 3.5–5.1)
Sodium: 140 mmol/L (ref 135–145)
Total Bilirubin: 0.5 mg/dL (ref 0.3–1.2)
Total Protein: 6.7 g/dL (ref 6.5–8.1)

## 2019-10-24 LAB — CANCER ANTIGEN 19-9: CA 19-9: 6 U/mL (ref 0–35)

## 2019-10-26 ENCOUNTER — Other Ambulatory Visit: Payer: Self-pay

## 2019-10-26 ENCOUNTER — Ambulatory Visit
Admission: RE | Admit: 2019-10-26 | Discharge: 2019-10-26 | Disposition: A | Discharge status: Home / Self Care | Payer: Medicare PPO | Source: Ambulatory Visit | Attending: Hematology | Admitting: Hematology

## 2019-10-26 DIAGNOSIS — K862 Cyst of pancreas: Secondary | ICD-10-CM

## 2019-10-26 MED ORDER — GADOBENATE DIMEGLUMINE 529 MG/ML IV SOLN
14.0000 mL | Freq: Once | INTRAVENOUS | Status: AC | PRN
  Administered 2019-10-26: 14 mL via INTRAVENOUS

## 2019-10-28 ENCOUNTER — Other Ambulatory Visit: Payer: Self-pay

## 2019-10-28 ENCOUNTER — Encounter: Payer: Self-pay | Admitting: Hematology

## 2019-10-28 ENCOUNTER — Inpatient Hospital Stay (HOSPITAL_BASED_OUTPATIENT_CLINIC_OR_DEPARTMENT_OTHER): Payer: Medicare PPO | Admitting: Hematology

## 2019-10-28 VITALS — BP 148/72 | HR 77 | Temp 98.0°F | Resp 17 | Ht 66.5 in | Wt 156.9 lb

## 2019-10-28 DIAGNOSIS — Z1509 Genetic susceptibility to other malignant neoplasm: Secondary | ICD-10-CM | POA: Diagnosis not present

## 2019-10-28 DIAGNOSIS — Z800 Family history of malignant neoplasm of digestive organs: Secondary | ICD-10-CM | POA: Diagnosis not present

## 2019-10-28 DIAGNOSIS — K862 Cyst of pancreas: Secondary | ICD-10-CM | POA: Diagnosis not present

## 2019-10-28 NOTE — Addendum Note (Signed)
Addended by: Truitt Merle on: 10/28/2019 05:48 PM   Modules accepted: Orders

## 2019-10-29 ENCOUNTER — Telehealth: Payer: Self-pay

## 2019-10-29 ENCOUNTER — Telehealth: Payer: Self-pay | Admitting: Hematology

## 2019-10-29 NOTE — Telephone Encounter (Signed)
-----   Message from [name], MD sent at 10/29/2019  5:29 AM EDT ----- Got it, Will put her in for EUS 10/2020 (one year from her recent MR). thanks   Margarett Viti, She needs upper EUS 10/2020 for pancreatic cancer screening.  Thanks  DJ ----- Message ----- From: Truitt Merle, MD Sent: 10/28/2019   5:45 PM EDT To: [name], MD  Dan,  Please see her back in one year for pancreatic cancer screening with EUS, thanks  Krista Blue

## 2019-10-29 NOTE — Telephone Encounter (Signed)
Recall has been entered  

## 2019-10-29 NOTE — Telephone Encounter (Signed)
Scheduled lab appt per 10/28/19 los.  MD template was not open for 2023, md visit will have to be added later.

## 2019-12-30 DIAGNOSIS — M7062 Trochanteric bursitis, left hip: Secondary | ICD-10-CM | POA: Insufficient documentation

## 2020-01-04 ENCOUNTER — Other Ambulatory Visit: Payer: Self-pay

## 2020-01-04 ENCOUNTER — Ambulatory Visit: Payer: Medicare Other | Admitting: Neurology

## 2020-01-04 ENCOUNTER — Encounter: Payer: Self-pay | Admitting: Neurology

## 2020-01-04 VITALS — BP 141/68 | HR 80 | Wt 154.0 lb

## 2020-01-04 DIAGNOSIS — E1142 Type 2 diabetes mellitus with diabetic polyneuropathy: Secondary | ICD-10-CM | POA: Diagnosis not present

## 2020-01-04 NOTE — Progress Notes (Signed)
Reason for visit: Diabetic peripheral neuropathy  Yvonne [name] is an 74 y.o. female  History of present illness:  Yvonne [name] is a 74 year old right-handed white female with a history of borderline diabetes, she is diet-controlled.  She will run hemoglobin A1c of 6.6 or 6.7.  The patient denies any symptoms in her hands but she still has some cold sensations in her feet.  She will have good and bad days with this.  She has some issues with insomnia in part related to this.  She will take gabapentin 300 mg at night and she may add a 100 mg capsule at times if she has difficulty.  She does not take gabapentin during the day.  She has had some problems with bursitis in the left hip and has required steroid injections.  She returns office today for further evaluation.  She is somewhat hesitant to take another medication such as Cymbalta for the neuropathy.  The patient does report some vivid dreams on gabapentin.  She denies any significant balance issues, she has not had any falls.  Past Medical History:  Diagnosis Date  . Anxiety   . Chest pressure   . Degenerative arthritis   . Diabetes mellitus without complication (HCC)    diet controlled  . Diabetic neuropathy ([place]) 12/03/2017  . Heart palpitations   . History of diastolic dysfunction    per echo in 2011  . Hyperlipidemia   . Hypertension   . Hypothyroidism   . LVH (left ventricular hypertrophy)    per echo in 2011  . Normal nuclear stress test 2011  . Polyneuropathy in other diseases classified elsewhere ([place]) 05/21/2013  . Sleep difficulties   . Tinnitus     Past Surgical History:  Procedure Laterality Date  . ABDOMINAL HYSTERECTOMY    . APPENDECTOMY    . CARDIOVASCULAR STRESS TEST  11/09/2009   EF 75%  . CARPAL TUNNEL RELEASE     bilateral  . COLONOSCOPY WITH PROPOFOL N/A 10/23/2018   Procedure: COLONOSCOPY WITH PROPOFOL;  Surgeon: [name], MD;  [facility];  Service: Endoscopy;  Laterality: N/A;   . ESOPHAGOGASTRODUODENOSCOPY (EGD) WITH PROPOFOL N/A 10/23/2018   Procedure: ESOPHAGOGASTRODUODENOSCOPY (EGD) WITH PROPOFOL;  Surgeon: [name], MD;  [facility];  Service: Endoscopy;  Laterality: N/A;  . EUS N/A 10/23/2018   Procedure: UPPER ENDOSCOPIC ULTRASOUND (EUS) RADIAL;  Surgeon: [name], MD;  [facility];  Service: Endoscopy;  Laterality: N/A;  . MASS EXCISION Left 06/02/2019   Procedure: EXCISION OF SUBCUTANEOUS ABDOMINAL WALL MASSES LEFT LOWER QUADRANT;  Surgeon: [name], MD;  Location: [facility];  Service: General;  Laterality: Left;  . TRANSTHORACIC ECHOCARDIOGRAM  11/01/2009   NORMAL LV FILLING AND DIASTOLIC DYSFUNCTION AND MILD AORTIC SCLEROSIS AND TRACE MITRAL REGURGITATION    Family History  Problem Relation Age of Onset  . Hypertension Mother   . Heart disease Mother   . Dementia Mother   . Heart disease Father   . Arthritis Father   . Diabetes Father   . Hypertension Father   . Cancer Brother 33       pancreatic  . Other Brother        automobile accident  . Stroke Maternal Grandmother   . Heart disease Maternal Grandfather   . Cancer Paternal Aunt        malignant brain cancer  . Cancer Other 49       pancreatic neuroendocrine carcinoma  . Cancer Cousin  64       pancreatic  . Cancer Cousin 60       pancreatic  . Cancer Cousin 52       breast  . Cancer Cousin 67       uterine  . Cancer Cousin 75       unknown primary  . Colon cancer Neg Hx     Social history:  reports that she quit smoking about 44 years ago. She has a 5.00 pack-year smoking history. She has never used smokeless tobacco. She reports current alcohol use. She reports that she does not use drugs.    Allergies  Allergen Reactions  . Tobramycin Hives  . Erythromycin Nausea Only  . Sulfur Nausea Only    Medications:  Prior to Admission medications   Medication Sig Start Date End Date Taking? Authorizing Provider  acetaminophen (TYLENOL) 325  MG tablet Take 650 mg by mouth every 6 (six) hours as needed.   Yes [provider]  ALPRAZolam (XANAX) 0.25 MG tablet Take 0.25 mg by mouth daily as needed for anxiety.    Yes [provider]  aspirin 81 MG tablet Take 81 mg by mouth daily.     Yes [provider]  Cholecalciferol (VITAMIN D) 50 MCG (2000 UT) tablet Take 2,000 Units by mouth daily.    Yes [provider]  fluticasone (FLONASE) 50 MCG/ACT nasal spray Place 2 sprays into both nostrils daily as needed for allergies or rhinitis.    Yes [provider]  furosemide (LASIX) 20 MG tablet Take 10 mg by mouth daily.   Yes [provider]  gabapentin (NEURONTIN) 100 MG capsule Take 1 capsule (100 mg total) by mouth 3 (three) times daily. PRN 07/02/19  Yes Suzzanne Cloud, NP  gabapentin (NEURONTIN) 300 MG capsule Take 300 mg by mouth at bedtime.    Yes Kathyrn Lass, MD  Influenza Vac High-Dose Quad (FLUZONE HIGH-DOSE QUADRIVALENT) 0.7 ML SUSY Fluzone High-Dose Quad 2020-21 (PF) 240 mcg/0.7 mL IM syringe  PHARMACY ADMINISTERED   Yes [provider]  levothyroxine (SYNTHROID, LEVOTHROID) 100 MCG tablet Take 100 mcg by mouth daily before breakfast.   Yes [provider]  metoprolol succinate (TOPROL-XL) 25 MG 24 hr tablet TAKE 1/2 TABLET BY MOUTH DAILY. Patient taking differently: Take 12.5 mg by mouth daily.  11/26/14  Yes Darlin Coco, MD  metroNIDAZOLE (METROCREAM) 0.75 % cream Apply 1 application topically daily.  12/21/14  Yes [provider]  potassium chloride SA (K-DUR,KLOR-CON) 20 MEQ tablet TAKE 1/2 TABLET BY MOUTH DAILY. Patient taking differently: Take 10 mEq by mouth daily.  09/30/15  Yes Darlin Coco, MD  Prenatal Vit-Fe Fumarate-FA (PRENATAL MULTIVITAMIN) TABS tablet Take 1 tablet by mouth daily at 12 noon.   Yes [provider]  rosuvastatin (CRESTOR) 5 MG tablet Take 1 tablet (5 mg total) by mouth daily. 07/04/15  Yes Darlin Coco, MD    ROS:  Out of a complete 14 system review of symptoms, the patient complains only of the following symptoms, and all other reviewed systems are negative.  Left hip pain Sensations of coldness in the feet Bilateral knee arthritis  Blood pressure (!) 141/68, pulse 80, weight 154 lb (69.9 kg).  Physical Exam  General: The patient is alert and cooperative at the time of the examination.  Skin: No significant peripheral edema is noted.   Neurologic Exam  Mental status: The patient is alert and oriented x 3 at the time of the examination.  The patient has apparent normal recent and remote memory, with an apparently normal attention span and concentration ability.   Cranial nerves: Facial symmetry is present. Speech is normal, no aphasia or dysarthria is noted. Extraocular movements are full. Visual fields are full.  Motor: The patient has good strength in all 4 extremities.  Sensory examination: Soft touch sensation is symmetric on the face, arms, and legs.  Coordination: The patient has good finger-nose-finger and heel-to-shin bilaterally.  Gait and station: The patient has a normal gait. Tandem gait is normal. Romberg is negative. No drift is seen.  The patient is able to walk on the heels and the toes bilaterally.  Reflexes: Deep tendon reflexes are symmetric, the ankle jerk reflexes are absent bilaterally.   Assessment/Plan:  1.  Diabetic peripheral neuropathy  The patient will be given a trial of capsaicin cream, she may consider anodyne therapy or laser therapy in the future.  If she does wish to go on Cymbalta, she will contact our office, she will follow-up in 6 months.  She does report some chronic insomnia issues.    Greater than 50% of the visit was spent in counseling and coordination of care.  Face-to-face time with the patient was 25 minutes.   Jill Alexanders MD 01/04/2020 9:46 AM  [facility] [address] [place], [place] [address]  Phone [phone] Fax [phone]

## 2020-01-26 ENCOUNTER — Ambulatory Visit: Payer: Medicare Other | Admitting: Podiatry

## 2020-02-18 NOTE — Telephone Encounter (Signed)
Error

## 2020-03-01 ENCOUNTER — Ambulatory Visit: Payer: Medicare PPO | Admitting: Podiatry

## 2020-03-16 DIAGNOSIS — M7062 Trochanteric bursitis, left hip: Secondary | ICD-10-CM | POA: Diagnosis not present

## 2020-03-16 DIAGNOSIS — M7061 Trochanteric bursitis, right hip: Secondary | ICD-10-CM | POA: Diagnosis not present

## 2020-03-16 DIAGNOSIS — M25562 Pain in left knee: Secondary | ICD-10-CM | POA: Diagnosis not present

## 2020-03-16 DIAGNOSIS — M2560 Stiffness of unspecified joint, not elsewhere classified: Secondary | ICD-10-CM | POA: Diagnosis not present

## 2020-03-16 DIAGNOSIS — M25561 Pain in right knee: Secondary | ICD-10-CM | POA: Diagnosis not present

## 2020-03-16 DIAGNOSIS — M25452 Effusion, left hip: Secondary | ICD-10-CM | POA: Diagnosis not present

## 2020-03-16 DIAGNOSIS — R262 Difficulty in walking, not elsewhere classified: Secondary | ICD-10-CM | POA: Diagnosis not present

## 2020-03-16 DIAGNOSIS — M6281 Muscle weakness (generalized): Secondary | ICD-10-CM | POA: Diagnosis not present

## 2020-03-21 DIAGNOSIS — M25561 Pain in right knee: Secondary | ICD-10-CM | POA: Diagnosis not present

## 2020-03-21 DIAGNOSIS — M7062 Trochanteric bursitis, left hip: Secondary | ICD-10-CM | POA: Diagnosis not present

## 2020-03-21 DIAGNOSIS — R262 Difficulty in walking, not elsewhere classified: Secondary | ICD-10-CM | POA: Diagnosis not present

## 2020-03-21 DIAGNOSIS — M7061 Trochanteric bursitis, right hip: Secondary | ICD-10-CM | POA: Diagnosis not present

## 2020-03-21 DIAGNOSIS — M6281 Muscle weakness (generalized): Secondary | ICD-10-CM | POA: Diagnosis not present

## 2020-03-21 DIAGNOSIS — M25452 Effusion, left hip: Secondary | ICD-10-CM | POA: Diagnosis not present

## 2020-03-21 DIAGNOSIS — M25562 Pain in left knee: Secondary | ICD-10-CM | POA: Diagnosis not present

## 2020-03-21 DIAGNOSIS — M2560 Stiffness of unspecified joint, not elsewhere classified: Secondary | ICD-10-CM | POA: Diagnosis not present

## 2020-03-29 DIAGNOSIS — M25452 Effusion, left hip: Secondary | ICD-10-CM | POA: Diagnosis not present

## 2020-03-29 DIAGNOSIS — M7061 Trochanteric bursitis, right hip: Secondary | ICD-10-CM | POA: Diagnosis not present

## 2020-03-29 DIAGNOSIS — M7062 Trochanteric bursitis, left hip: Secondary | ICD-10-CM | POA: Diagnosis not present

## 2020-03-29 DIAGNOSIS — M25562 Pain in left knee: Secondary | ICD-10-CM | POA: Diagnosis not present

## 2020-03-29 DIAGNOSIS — M6281 Muscle weakness (generalized): Secondary | ICD-10-CM | POA: Diagnosis not present

## 2020-03-29 DIAGNOSIS — R262 Difficulty in walking, not elsewhere classified: Secondary | ICD-10-CM | POA: Diagnosis not present

## 2020-03-29 DIAGNOSIS — M2560 Stiffness of unspecified joint, not elsewhere classified: Secondary | ICD-10-CM | POA: Diagnosis not present

## 2020-03-29 DIAGNOSIS — M25561 Pain in right knee: Secondary | ICD-10-CM | POA: Diagnosis not present

## 2020-03-31 DIAGNOSIS — M25452 Effusion, left hip: Secondary | ICD-10-CM | POA: Diagnosis not present

## 2020-03-31 DIAGNOSIS — M2560 Stiffness of unspecified joint, not elsewhere classified: Secondary | ICD-10-CM | POA: Diagnosis not present

## 2020-03-31 DIAGNOSIS — M7061 Trochanteric bursitis, right hip: Secondary | ICD-10-CM | POA: Diagnosis not present

## 2020-03-31 DIAGNOSIS — R262 Difficulty in walking, not elsewhere classified: Secondary | ICD-10-CM | POA: Diagnosis not present

## 2020-03-31 DIAGNOSIS — M25561 Pain in right knee: Secondary | ICD-10-CM | POA: Diagnosis not present

## 2020-03-31 DIAGNOSIS — M25562 Pain in left knee: Secondary | ICD-10-CM | POA: Diagnosis not present

## 2020-03-31 DIAGNOSIS — M6281 Muscle weakness (generalized): Secondary | ICD-10-CM | POA: Diagnosis not present

## 2020-03-31 DIAGNOSIS — M7062 Trochanteric bursitis, left hip: Secondary | ICD-10-CM | POA: Diagnosis not present

## 2020-04-06 DIAGNOSIS — R262 Difficulty in walking, not elsewhere classified: Secondary | ICD-10-CM | POA: Diagnosis not present

## 2020-04-06 DIAGNOSIS — M7061 Trochanteric bursitis, right hip: Secondary | ICD-10-CM | POA: Diagnosis not present

## 2020-04-06 DIAGNOSIS — M25452 Effusion, left hip: Secondary | ICD-10-CM | POA: Diagnosis not present

## 2020-04-06 DIAGNOSIS — M6281 Muscle weakness (generalized): Secondary | ICD-10-CM | POA: Diagnosis not present

## 2020-04-06 DIAGNOSIS — M7062 Trochanteric bursitis, left hip: Secondary | ICD-10-CM | POA: Diagnosis not present

## 2020-04-06 DIAGNOSIS — M25561 Pain in right knee: Secondary | ICD-10-CM | POA: Diagnosis not present

## 2020-04-06 DIAGNOSIS — M2560 Stiffness of unspecified joint, not elsewhere classified: Secondary | ICD-10-CM | POA: Diagnosis not present

## 2020-04-06 DIAGNOSIS — M25562 Pain in left knee: Secondary | ICD-10-CM | POA: Diagnosis not present

## 2020-04-07 DIAGNOSIS — M7062 Trochanteric bursitis, left hip: Secondary | ICD-10-CM | POA: Diagnosis not present

## 2020-04-07 DIAGNOSIS — M170 Bilateral primary osteoarthritis of knee: Secondary | ICD-10-CM | POA: Diagnosis not present

## 2020-04-08 DIAGNOSIS — M7061 Trochanteric bursitis, right hip: Secondary | ICD-10-CM | POA: Diagnosis not present

## 2020-04-08 DIAGNOSIS — R262 Difficulty in walking, not elsewhere classified: Secondary | ICD-10-CM | POA: Diagnosis not present

## 2020-04-08 DIAGNOSIS — M2560 Stiffness of unspecified joint, not elsewhere classified: Secondary | ICD-10-CM | POA: Diagnosis not present

## 2020-04-08 DIAGNOSIS — M7062 Trochanteric bursitis, left hip: Secondary | ICD-10-CM | POA: Diagnosis not present

## 2020-04-08 DIAGNOSIS — M25562 Pain in left knee: Secondary | ICD-10-CM | POA: Diagnosis not present

## 2020-04-08 DIAGNOSIS — M6281 Muscle weakness (generalized): Secondary | ICD-10-CM | POA: Diagnosis not present

## 2020-04-08 DIAGNOSIS — M25561 Pain in right knee: Secondary | ICD-10-CM | POA: Diagnosis not present

## 2020-04-08 DIAGNOSIS — M25452 Effusion, left hip: Secondary | ICD-10-CM | POA: Diagnosis not present

## 2020-04-12 DIAGNOSIS — M2560 Stiffness of unspecified joint, not elsewhere classified: Secondary | ICD-10-CM | POA: Diagnosis not present

## 2020-04-12 DIAGNOSIS — M7061 Trochanteric bursitis, right hip: Secondary | ICD-10-CM | POA: Diagnosis not present

## 2020-04-12 DIAGNOSIS — M25562 Pain in left knee: Secondary | ICD-10-CM | POA: Diagnosis not present

## 2020-04-12 DIAGNOSIS — M6281 Muscle weakness (generalized): Secondary | ICD-10-CM | POA: Diagnosis not present

## 2020-04-12 DIAGNOSIS — M25452 Effusion, left hip: Secondary | ICD-10-CM | POA: Diagnosis not present

## 2020-04-12 DIAGNOSIS — R262 Difficulty in walking, not elsewhere classified: Secondary | ICD-10-CM | POA: Diagnosis not present

## 2020-04-12 DIAGNOSIS — M25561 Pain in right knee: Secondary | ICD-10-CM | POA: Diagnosis not present

## 2020-04-12 DIAGNOSIS — M7062 Trochanteric bursitis, left hip: Secondary | ICD-10-CM | POA: Diagnosis not present

## 2020-04-19 DIAGNOSIS — M25561 Pain in right knee: Secondary | ICD-10-CM | POA: Diagnosis not present

## 2020-04-19 DIAGNOSIS — M25562 Pain in left knee: Secondary | ICD-10-CM | POA: Diagnosis not present

## 2020-04-19 DIAGNOSIS — R262 Difficulty in walking, not elsewhere classified: Secondary | ICD-10-CM | POA: Diagnosis not present

## 2020-04-19 DIAGNOSIS — M25452 Effusion, left hip: Secondary | ICD-10-CM | POA: Diagnosis not present

## 2020-04-19 DIAGNOSIS — M2560 Stiffness of unspecified joint, not elsewhere classified: Secondary | ICD-10-CM | POA: Diagnosis not present

## 2020-04-19 DIAGNOSIS — M7061 Trochanteric bursitis, right hip: Secondary | ICD-10-CM | POA: Diagnosis not present

## 2020-04-19 DIAGNOSIS — M6281 Muscle weakness (generalized): Secondary | ICD-10-CM | POA: Diagnosis not present

## 2020-04-19 DIAGNOSIS — M7062 Trochanteric bursitis, left hip: Secondary | ICD-10-CM | POA: Diagnosis not present

## 2020-04-21 DIAGNOSIS — M25452 Effusion, left hip: Secondary | ICD-10-CM | POA: Diagnosis not present

## 2020-04-21 DIAGNOSIS — R262 Difficulty in walking, not elsewhere classified: Secondary | ICD-10-CM | POA: Diagnosis not present

## 2020-04-21 DIAGNOSIS — M7061 Trochanteric bursitis, right hip: Secondary | ICD-10-CM | POA: Diagnosis not present

## 2020-04-21 DIAGNOSIS — M6281 Muscle weakness (generalized): Secondary | ICD-10-CM | POA: Diagnosis not present

## 2020-04-21 DIAGNOSIS — M7062 Trochanteric bursitis, left hip: Secondary | ICD-10-CM | POA: Diagnosis not present

## 2020-04-21 DIAGNOSIS — M25562 Pain in left knee: Secondary | ICD-10-CM | POA: Diagnosis not present

## 2020-04-21 DIAGNOSIS — M2560 Stiffness of unspecified joint, not elsewhere classified: Secondary | ICD-10-CM | POA: Diagnosis not present

## 2020-04-21 DIAGNOSIS — M25561 Pain in right knee: Secondary | ICD-10-CM | POA: Diagnosis not present

## 2020-04-27 DIAGNOSIS — H52203 Unspecified astigmatism, bilateral: Secondary | ICD-10-CM | POA: Diagnosis not present

## 2020-04-27 DIAGNOSIS — H2513 Age-related nuclear cataract, bilateral: Secondary | ICD-10-CM | POA: Diagnosis not present

## 2020-04-27 DIAGNOSIS — E119 Type 2 diabetes mellitus without complications: Secondary | ICD-10-CM | POA: Diagnosis not present

## 2020-05-09 DIAGNOSIS — J302 Other seasonal allergic rhinitis: Secondary | ICD-10-CM | POA: Diagnosis not present

## 2020-05-09 DIAGNOSIS — H9313 Tinnitus, bilateral: Secondary | ICD-10-CM | POA: Diagnosis not present

## 2020-05-09 DIAGNOSIS — H903 Sensorineural hearing loss, bilateral: Secondary | ICD-10-CM | POA: Diagnosis not present

## 2020-05-09 DIAGNOSIS — J342 Deviated nasal septum: Secondary | ICD-10-CM | POA: Diagnosis not present

## 2020-05-09 DIAGNOSIS — H6123 Impacted cerumen, bilateral: Secondary | ICD-10-CM | POA: Diagnosis not present

## 2020-05-14 DIAGNOSIS — Z20822 Contact with and (suspected) exposure to covid-19: Secondary | ICD-10-CM | POA: Diagnosis not present

## 2020-06-01 DIAGNOSIS — M7062 Trochanteric bursitis, left hip: Secondary | ICD-10-CM | POA: Diagnosis not present

## 2020-06-01 DIAGNOSIS — R262 Difficulty in walking, not elsewhere classified: Secondary | ICD-10-CM | POA: Diagnosis not present

## 2020-06-01 DIAGNOSIS — M25561 Pain in right knee: Secondary | ICD-10-CM | POA: Diagnosis not present

## 2020-06-01 DIAGNOSIS — M25452 Effusion, left hip: Secondary | ICD-10-CM | POA: Diagnosis not present

## 2020-06-01 DIAGNOSIS — M7061 Trochanteric bursitis, right hip: Secondary | ICD-10-CM | POA: Diagnosis not present

## 2020-06-01 DIAGNOSIS — M6281 Muscle weakness (generalized): Secondary | ICD-10-CM | POA: Diagnosis not present

## 2020-06-01 DIAGNOSIS — M25562 Pain in left knee: Secondary | ICD-10-CM | POA: Diagnosis not present

## 2020-06-01 DIAGNOSIS — M2560 Stiffness of unspecified joint, not elsewhere classified: Secondary | ICD-10-CM | POA: Diagnosis not present

## 2020-06-03 DIAGNOSIS — M7062 Trochanteric bursitis, left hip: Secondary | ICD-10-CM | POA: Diagnosis not present

## 2020-06-03 DIAGNOSIS — M25562 Pain in left knee: Secondary | ICD-10-CM | POA: Diagnosis not present

## 2020-06-03 DIAGNOSIS — R262 Difficulty in walking, not elsewhere classified: Secondary | ICD-10-CM | POA: Diagnosis not present

## 2020-06-03 DIAGNOSIS — M25561 Pain in right knee: Secondary | ICD-10-CM | POA: Diagnosis not present

## 2020-06-03 DIAGNOSIS — M25452 Effusion, left hip: Secondary | ICD-10-CM | POA: Diagnosis not present

## 2020-06-03 DIAGNOSIS — M2560 Stiffness of unspecified joint, not elsewhere classified: Secondary | ICD-10-CM | POA: Diagnosis not present

## 2020-06-03 DIAGNOSIS — M7061 Trochanteric bursitis, right hip: Secondary | ICD-10-CM | POA: Diagnosis not present

## 2020-06-03 DIAGNOSIS — M6281 Muscle weakness (generalized): Secondary | ICD-10-CM | POA: Diagnosis not present

## 2020-06-05 NOTE — Progress Notes (Signed)
Chief Complaint  Patient presents with  . New Patient (Initial Visit)    HTN      History of Present Illness: 74 yo female with history of mitral valve prolapse, Graves disease, DM-diet controlled, HTN, hyperlipidemia, anxiety, peripheral neuropathy and hypothyroidism who is here today for cardiac evaluation. She had been followed in the past by Dr. Mare Ferrari and Dr. Debara Pickett but has not been seen in our office since 2017. Nuclear stress test in 2013 with no ischemia. No echo on file. She tells me today that she has been feeling well. She has no chest pain, dyspnea, palpitations, lower extremity edema, dizziness, near syncope or syncope. Overall feeling well.   Primary Care Physician: Kathyrn Lass, MD   Past Medical History:  Diagnosis Date  . Anxiety   . Chest pressure   . Degenerative arthritis   . Deviated septum   . Diabetes mellitus without complication (HCC)    diet controlled  . Diabetic neuropathy ([place]) 12/03/2017  . DJD (degenerative joint disease) of knee   . Graves disease    RADIOACTIVE IODINE 1983  . H/O hiatal hernia   . Heart palpitations   . History of diastolic dysfunction    per echo in 2011  . Hyperlipidemia   . Hypertension   . Hypothyroidism   . LVH (left ventricular hypertrophy)    per echo in 2011  . Mitral valve prolapse   . Neuropathy, peripheral    DR. Jannifer Franklin 12/15  . Normal nuclear stress test 2011  . Polyneuropathy in other diseases classified elsewhere ([place]) 05/21/2013  . Rosacea 2019   MELANOMA CHEST REMOVED IN SITU  . Sleep difficulties   . Tinnitus     Past Surgical History:  Procedure Laterality Date  . ABDOMINAL HYSTERECTOMY  2000   TOTAL  . APPENDECTOMY  1989  . CARDIOVASCULAR STRESS TEST  11/09/2009   EF 75%  . CARPAL TUNNEL RELEASE  2002   bilateral  . COLONOSCOPY WITH PROPOFOL N/A 10/23/2018   Procedure: COLONOSCOPY WITH PROPOFOL;  Surgeon: [name], MD;  Location: [facility];  Service: Endoscopy;  Laterality: N/A;  .  ESOPHAGOGASTRODUODENOSCOPY (EGD) WITH PROPOFOL N/A 10/23/2018   Procedure: ESOPHAGOGASTRODUODENOSCOPY (EGD) WITH PROPOFOL;  Surgeon: [name], MD;  [facility];  Service: Endoscopy;  Laterality: N/A;  . EUS N/A 10/23/2018   Procedure: UPPER ENDOSCOPIC ULTRASOUND (EUS) RADIAL;  Surgeon: [name], MD;  [facility];  Service: Endoscopy;  Laterality: N/A;  . MASS EXCISION Left 06/02/2019   Procedure: EXCISION OF SUBCUTANEOUS ABDOMINAL WALL MASSES LEFT LOWER QUADRANT;  Surgeon: [name], MD;  Location: [facility];  Service: General;  Laterality: Left;  . TONSILLECTOMY  1958  . TRANSTHORACIC ECHOCARDIOGRAM  11/01/2009   NORMAL LV FILLING AND DIASTOLIC DYSFUNCTION AND MILD AORTIC SCLEROSIS AND TRACE MITRAL REGURGITATION    Current Outpatient Medications  Medication Sig Dispense Refill  . acetaminophen (TYLENOL) 325 MG tablet Take 650 mg by mouth every 6 (six) hours as needed.    . ALPRAZolam (XANAX) 0.25 MG tablet Take 0.25 mg by mouth daily as needed for anxiety.     Marland Kitchen aspirin 81 MG tablet Take 81 mg by mouth daily.      . Cholecalciferol (VITAMIN D) 50 MCG (2000 UT) tablet Take 2,000 Units by mouth daily.     . fluticasone (FLONASE) 50 MCG/ACT nasal spray Place 2 sprays into both nostrils daily as needed for allergies or rhinitis.     . furosemide (LASIX) 20  MG tablet Take 10 mg by mouth daily.    Marland Kitchen gabapentin (NEURONTIN) 100 MG capsule Take 1 capsule (100 mg total) by mouth 3 (three) times daily. PRN 90 capsule 1  . gabapentin (NEURONTIN) 300 MG capsule Take 300 mg by mouth at bedtime.     . Influenza Vac High-Dose Quad (FLUZONE HIGH-DOSE QUADRIVALENT) 0.7 ML SUSY Fluzone High-Dose Quad 2020-21 (PF) 240 mcg/0.7 mL IM syringe  PHARMACY ADMINISTERED    . levothyroxine (SYNTHROID, LEVOTHROID) 100 MCG tablet Take 100 mcg by mouth daily before breakfast.    . metoprolol succinate (TOPROL-XL) 25 MG 24 hr tablet TAKE 1/2 TABLET BY MOUTH DAILY. 45 tablet 3  .  metroNIDAZOLE (METROCREAM) 0.75 % cream Apply 1 application topically daily.     . potassium chloride SA (K-DUR,KLOR-CON) 20 MEQ tablet TAKE 1/2 TABLET BY MOUTH DAILY. 45 tablet 3  . Prenatal Vit-Fe Fumarate-FA (PRENATAL MULTIVITAMIN) TABS tablet Take 1 tablet by mouth daily at 12 noon.    . rosuvastatin (CRESTOR) 5 MG tablet Take 1 tablet (5 mg total) by mouth daily. 90 tablet 3   No current facility-administered medications for this visit.    Allergies  Allergen Reactions  . Tobramycin Hives  . Benadryl [Diphenhydramine]   . Claritin [Loratadine]   . Prednisone   . Erythromycin Nausea Only  . Sulfur Nausea Only    Social History   Socioeconomic History  . Marital status: Married    Spouse name: Edd Fabian  . Number of children: 1  . Years of education: COLLEGE  . Highest education level: Not on file  Occupational History  . Occupation: Retired  Tobacco Use  . Smoking status: Former Smoker    Packs/day: 0.50    Years: 10.00    Pack years: 5.00    Quit date: 08/14/1975    Years since quitting: 44.8  . Smokeless tobacco: Never Used  Vaping Use  . Vaping Use: Never used  Substance and Sexual Activity  . Alcohol use: Yes    Comment: one glass of wine a week  . Drug use: No  . Sexual activity: Not on file  Other Topics Concern  . Not on file  Social History Narrative   Lives at home w/ her husband, Edd Fabian.   Right-handed.   Drinks no caffeine but does eat chocolate occasionally.   epworth sleepiness scale = 3 (01/10/16)   Social Determinants of Health   Financial Resource Strain:   . Difficulty of Paying Living Expenses: Not on file  Food Insecurity:   . Worried About Charity fundraiser in the Last Year: Not on file  . Ran Out of Food in the Last Year: Not on file  Transportation Needs:   . Lack of Transportation (Medical): Not on file  . Lack of Transportation (Non-Medical): Not on file  Physical Activity:   . Days of Exercise per Week: Not on file  . Minutes of  Exercise per Session: Not on file  Stress:   . Feeling of Stress : Not on file  Social Connections:   . Frequency of Communication with Friends and Family: Not on file  . Frequency of Social Gatherings with Friends and Family: Not on file  . Attends Religious Services: Not on file  . Active Member of Clubs or Organizations: Not on file  . Attends Archivist Meetings: Not on file  . Marital Status: Not on file  Intimate Partner Violence:   . Fear of Current or Ex-Partner: Not on file  .  Emotionally Abused: Not on file  . Physically Abused: Not on file  . Sexually Abused: Not on file    Family History  Problem Relation Age of Onset  . Hypertension Mother   . Heart disease Mother   . Dementia Mother   . Heart disease Father   . Arthritis Father   . Diabetes Father   . Hypertension Father   . Cancer Brother 73       pancreatic  . Other Brother        automobile accident  . Stroke Maternal Grandmother   . Heart disease Maternal Grandfather   . Cancer Paternal Aunt        malignant brain cancer  . Cancer Other 49       pancreatic neuroendocrine carcinoma  . Cancer Cousin 64       pancreatic  . Cancer Cousin 60       pancreatic  . Cancer Cousin 52       breast  . Cancer Cousin 67       uterine  . Cancer Cousin 75       unknown primary  . Colon cancer Neg Hx     Review of Systems:  As stated in the HPI and otherwise negative.   BP 116/60   Pulse 73   Ht 5\' 6"  (1.676 m)   Wt 153 lb (69.4 kg)   SpO2 97%   BMI 24.69 kg/m   Physical Examination: General: Well developed, well nourished, NAD  HEENT: OP clear, mucus membranes moist  SKIN: warm, dry. No rashes. Neuro: No focal deficits  Musculoskeletal: Muscle strength 5/5 all ext  Psychiatric: Mood and affect normal  Neck: No JVD, no carotid bruits, no thyromegaly, no lymphadenopathy.  Lungs:Clear bilaterally, no wheezes, rhonci, crackles Cardiovascular: Regular rate and rhythm. No murmurs, gallops or  rubs. Abdomen:Soft. Bowel sounds present. Non-tender.  Extremities: No lower extremity edema. Pulses are 2 + in the bilateral DP/PT.  EKG:  EKG is ordered today The ekg ordered today demonstrates sinus rhythm  Recent Labs: 10/23/2019: ALT 17; BUN 12; Creatinine 0.76; Hemoglobin 13.3; Platelet Count 192; Potassium 4.1; Sodium 140   Lipid Panel    Component Value Date/Time   CHOL 134 01/04/2016 0739   TRIG 91 01/04/2016 0739   HDL 39 (L) 01/04/2016 0739   CHOLHDL 3.4 01/04/2016 0739   VLDL 18 01/04/2016 0739   LDLCALC 77 01/04/2016 0739     Wt Readings from Last 3 Encounters:  06/06/20 153 lb (69.4 kg)  01/04/20 154 lb (69.9 kg)  10/28/19 156 lb 14.4 oz (71.2 kg)      Assessment and Plan:   1. Mitral valve prolapse: No loud cardiac murmurs on exam today. Last echo in 2011. Will arrange an echo now.   2. Fatigue: She has a strong family history of CAD. She has a personal of HTN, HLD. Will arrange an exercise stress test to exclude ischemia.   Current medicines are reviewed at length with the patient today.  The patient does not have concerns regarding medicines.  The following changes have been made:  no change  Labs/ tests ordered today include:   Orders Placed This Encounter  Procedures  . EXERCISE TOLERANCE TEST (ETT)  . EKG 12-Lead  . ECHOCARDIOGRAM COMPLETE   Disposition:   F/U with me in one year.    Signed, Lauree Chandler, MD 06/06/2020 10:37 AM    [facility], [place], [place]  [address]  Phone: [phone]; Fax: [phone]

## 2020-06-06 ENCOUNTER — Other Ambulatory Visit: Payer: Self-pay

## 2020-06-06 ENCOUNTER — Ambulatory Visit: Payer: Medicare PPO | Admitting: Cardiovascular Disease

## 2020-06-06 ENCOUNTER — Encounter: Payer: Self-pay | Admitting: Cardiovascular Disease

## 2020-06-06 VITALS — BP 116/60 | HR 73 | Ht 66.0 in | Wt 153.0 lb

## 2020-06-06 DIAGNOSIS — I341 Nonrheumatic mitral (valve) prolapse: Secondary | ICD-10-CM

## 2020-06-06 DIAGNOSIS — R5383 Other fatigue: Secondary | ICD-10-CM | POA: Diagnosis not present

## 2020-06-06 NOTE — Patient Instructions (Addendum)
Medication Instructions:  The current medical regimen is effective;  continue present plan and medications.  *If you need a refill on your cardiac medications before your next appointment, please call your pharmacy*  Testing/Procedures: Your physician has requested that you have an echocardiogram. Echocardiography is a painless test that uses sound waves to create images of your heart. It provides your doctor with information about the size and shape of your heart and how well your heart's chambers and valves are working. This procedure takes approximately one hour. There are no restrictions for this procedure.  Your physician has requested that you have an exercise tolerance test. For further information please visit [email or web address]. Please also follow instruction sheet, as given.  Follow-Up: At [facility], you and your health needs are our priority.  As part of our continuing mission to provide you with exceptional heart care, we have created designated Provider Care Teams.  These Care Teams include your primary Cardiologist (physician) and Advanced Practice Providers (APPs -  Physician Assistants and Nurse Practitioners) who all work together to provide you with the care you need, when you need it.  We recommend signing up for the patient portal called "MyChart".  Sign up information is provided on this After Visit Summary.  MyChart is used to connect with patients for Virtual Visits (Telemedicine).  Patients are able to view lab/test results, encounter notes, upcoming appointments, etc.  Non-urgent messages can be sent to your provider as well.   To learn more about what you can do with MyChart, go to [email or web address].    Your next appointment:   12 month(s)  The format for your next appointment:   In Person  Provider:   Lauree Chandler, MD  Thank you for choosing [facility]!!

## 2020-06-07 DIAGNOSIS — R262 Difficulty in walking, not elsewhere classified: Secondary | ICD-10-CM | POA: Diagnosis not present

## 2020-06-07 DIAGNOSIS — M2560 Stiffness of unspecified joint, not elsewhere classified: Secondary | ICD-10-CM | POA: Diagnosis not present

## 2020-06-07 DIAGNOSIS — M7062 Trochanteric bursitis, left hip: Secondary | ICD-10-CM | POA: Diagnosis not present

## 2020-06-07 DIAGNOSIS — M25561 Pain in right knee: Secondary | ICD-10-CM | POA: Diagnosis not present

## 2020-06-07 DIAGNOSIS — M25452 Effusion, left hip: Secondary | ICD-10-CM | POA: Diagnosis not present

## 2020-06-07 DIAGNOSIS — M25562 Pain in left knee: Secondary | ICD-10-CM | POA: Diagnosis not present

## 2020-06-07 DIAGNOSIS — M6281 Muscle weakness (generalized): Secondary | ICD-10-CM | POA: Diagnosis not present

## 2020-06-07 DIAGNOSIS — M7061 Trochanteric bursitis, right hip: Secondary | ICD-10-CM | POA: Diagnosis not present

## 2020-06-10 DIAGNOSIS — R262 Difficulty in walking, not elsewhere classified: Secondary | ICD-10-CM | POA: Diagnosis not present

## 2020-06-10 DIAGNOSIS — M2560 Stiffness of unspecified joint, not elsewhere classified: Secondary | ICD-10-CM | POA: Diagnosis not present

## 2020-06-10 DIAGNOSIS — M25561 Pain in right knee: Secondary | ICD-10-CM | POA: Diagnosis not present

## 2020-06-10 DIAGNOSIS — M25452 Effusion, left hip: Secondary | ICD-10-CM | POA: Diagnosis not present

## 2020-06-10 DIAGNOSIS — M7062 Trochanteric bursitis, left hip: Secondary | ICD-10-CM | POA: Diagnosis not present

## 2020-06-10 DIAGNOSIS — M25562 Pain in left knee: Secondary | ICD-10-CM | POA: Diagnosis not present

## 2020-06-10 DIAGNOSIS — M7061 Trochanteric bursitis, right hip: Secondary | ICD-10-CM | POA: Diagnosis not present

## 2020-06-10 DIAGNOSIS — M6281 Muscle weakness (generalized): Secondary | ICD-10-CM | POA: Diagnosis not present

## 2020-06-14 DIAGNOSIS — R262 Difficulty in walking, not elsewhere classified: Secondary | ICD-10-CM | POA: Diagnosis not present

## 2020-06-14 DIAGNOSIS — M2560 Stiffness of unspecified joint, not elsewhere classified: Secondary | ICD-10-CM | POA: Diagnosis not present

## 2020-06-14 DIAGNOSIS — M25561 Pain in right knee: Secondary | ICD-10-CM | POA: Diagnosis not present

## 2020-06-14 DIAGNOSIS — M7062 Trochanteric bursitis, left hip: Secondary | ICD-10-CM | POA: Diagnosis not present

## 2020-06-14 DIAGNOSIS — M7061 Trochanteric bursitis, right hip: Secondary | ICD-10-CM | POA: Diagnosis not present

## 2020-06-14 DIAGNOSIS — M6281 Muscle weakness (generalized): Secondary | ICD-10-CM | POA: Diagnosis not present

## 2020-06-14 DIAGNOSIS — M25452 Effusion, left hip: Secondary | ICD-10-CM | POA: Diagnosis not present

## 2020-06-14 DIAGNOSIS — M25562 Pain in left knee: Secondary | ICD-10-CM | POA: Diagnosis not present

## 2020-06-16 DIAGNOSIS — M25561 Pain in right knee: Secondary | ICD-10-CM | POA: Diagnosis not present

## 2020-06-16 DIAGNOSIS — M7061 Trochanteric bursitis, right hip: Secondary | ICD-10-CM | POA: Diagnosis not present

## 2020-06-16 DIAGNOSIS — M25562 Pain in left knee: Secondary | ICD-10-CM | POA: Diagnosis not present

## 2020-06-16 DIAGNOSIS — M2560 Stiffness of unspecified joint, not elsewhere classified: Secondary | ICD-10-CM | POA: Diagnosis not present

## 2020-06-16 DIAGNOSIS — M6281 Muscle weakness (generalized): Secondary | ICD-10-CM | POA: Diagnosis not present

## 2020-06-16 DIAGNOSIS — R262 Difficulty in walking, not elsewhere classified: Secondary | ICD-10-CM | POA: Diagnosis not present

## 2020-06-16 DIAGNOSIS — M25452 Effusion, left hip: Secondary | ICD-10-CM | POA: Diagnosis not present

## 2020-06-16 DIAGNOSIS — M7062 Trochanteric bursitis, left hip: Secondary | ICD-10-CM | POA: Diagnosis not present

## 2020-06-24 DIAGNOSIS — M25452 Effusion, left hip: Secondary | ICD-10-CM | POA: Diagnosis not present

## 2020-06-24 DIAGNOSIS — M7062 Trochanteric bursitis, left hip: Secondary | ICD-10-CM | POA: Diagnosis not present

## 2020-06-24 DIAGNOSIS — M2560 Stiffness of unspecified joint, not elsewhere classified: Secondary | ICD-10-CM | POA: Diagnosis not present

## 2020-06-24 DIAGNOSIS — M25561 Pain in right knee: Secondary | ICD-10-CM | POA: Diagnosis not present

## 2020-06-24 DIAGNOSIS — R262 Difficulty in walking, not elsewhere classified: Secondary | ICD-10-CM | POA: Diagnosis not present

## 2020-06-24 DIAGNOSIS — M25562 Pain in left knee: Secondary | ICD-10-CM | POA: Diagnosis not present

## 2020-06-24 DIAGNOSIS — M6281 Muscle weakness (generalized): Secondary | ICD-10-CM | POA: Diagnosis not present

## 2020-06-24 DIAGNOSIS — M7061 Trochanteric bursitis, right hip: Secondary | ICD-10-CM | POA: Diagnosis not present

## 2020-06-28 DIAGNOSIS — M6281 Muscle weakness (generalized): Secondary | ICD-10-CM | POA: Diagnosis not present

## 2020-06-28 DIAGNOSIS — M2560 Stiffness of unspecified joint, not elsewhere classified: Secondary | ICD-10-CM | POA: Diagnosis not present

## 2020-06-28 DIAGNOSIS — M7061 Trochanteric bursitis, right hip: Secondary | ICD-10-CM | POA: Diagnosis not present

## 2020-06-28 DIAGNOSIS — R262 Difficulty in walking, not elsewhere classified: Secondary | ICD-10-CM | POA: Diagnosis not present

## 2020-06-28 DIAGNOSIS — M25452 Effusion, left hip: Secondary | ICD-10-CM | POA: Diagnosis not present

## 2020-06-28 DIAGNOSIS — M7062 Trochanteric bursitis, left hip: Secondary | ICD-10-CM | POA: Diagnosis not present

## 2020-06-28 DIAGNOSIS — M25561 Pain in right knee: Secondary | ICD-10-CM | POA: Diagnosis not present

## 2020-06-28 DIAGNOSIS — M25562 Pain in left knee: Secondary | ICD-10-CM | POA: Diagnosis not present

## 2020-06-29 DIAGNOSIS — Z1231 Encounter for screening mammogram for malignant neoplasm of breast: Secondary | ICD-10-CM | POA: Diagnosis not present

## 2020-06-30 DIAGNOSIS — M25561 Pain in right knee: Secondary | ICD-10-CM | POA: Diagnosis not present

## 2020-06-30 DIAGNOSIS — R262 Difficulty in walking, not elsewhere classified: Secondary | ICD-10-CM | POA: Diagnosis not present

## 2020-06-30 DIAGNOSIS — M25452 Effusion, left hip: Secondary | ICD-10-CM | POA: Diagnosis not present

## 2020-06-30 DIAGNOSIS — M7061 Trochanteric bursitis, right hip: Secondary | ICD-10-CM | POA: Diagnosis not present

## 2020-06-30 DIAGNOSIS — M7062 Trochanteric bursitis, left hip: Secondary | ICD-10-CM | POA: Diagnosis not present

## 2020-06-30 DIAGNOSIS — M2560 Stiffness of unspecified joint, not elsewhere classified: Secondary | ICD-10-CM | POA: Diagnosis not present

## 2020-06-30 DIAGNOSIS — M6281 Muscle weakness (generalized): Secondary | ICD-10-CM | POA: Diagnosis not present

## 2020-06-30 DIAGNOSIS — M25562 Pain in left knee: Secondary | ICD-10-CM | POA: Diagnosis not present

## 2020-07-05 ENCOUNTER — Ambulatory Visit: Payer: Medicare PPO | Admitting: Neurology

## 2020-07-08 ENCOUNTER — Other Ambulatory Visit (HOSPITAL_COMMUNITY)
Admission: RE | Admit: 2020-07-08 | Discharge: 2020-07-08 | Disposition: A | Discharge status: Home / Self Care | Payer: Medicare PPO | Source: Ambulatory Visit | Attending: Cardiovascular Disease | Admitting: Cardiovascular Disease

## 2020-07-08 DIAGNOSIS — Z20822 Contact with and (suspected) exposure to covid-19: Secondary | ICD-10-CM | POA: Diagnosis not present

## 2020-07-08 DIAGNOSIS — Z01812 Encounter for preprocedural laboratory examination: Secondary | ICD-10-CM | POA: Diagnosis not present

## 2020-07-08 LAB — SARS CORONAVIRUS 2 (TAT 6-24 HRS): SARS Coronavirus 2: NEGATIVE

## 2020-07-12 ENCOUNTER — Ambulatory Visit (INDEPENDENT_AMBULATORY_CARE_PROVIDER_SITE_OTHER): Payer: Medicare PPO

## 2020-07-12 ENCOUNTER — Other Ambulatory Visit: Payer: Self-pay

## 2020-07-12 ENCOUNTER — Ambulatory Visit (HOSPITAL_COMMUNITY): Payer: Medicare PPO | Attending: Cardiology

## 2020-07-12 DIAGNOSIS — R5383 Other fatigue: Secondary | ICD-10-CM | POA: Diagnosis not present

## 2020-07-12 DIAGNOSIS — I341 Nonrheumatic mitral (valve) prolapse: Secondary | ICD-10-CM | POA: Diagnosis not present

## 2020-07-12 LAB — EXERCISE TOLERANCE TEST
Estimated workload: 10.1 METS
Exercise duration (min): 7 min
Exercise duration (sec): 26 s
MPHR: 148 {beats}/min
Peak HR: 127 {beats}/min
Percent HR: 85 %
RPE: 17
Rest HR: 75 {beats}/min

## 2020-07-12 LAB — ECHOCARDIOGRAM COMPLETE
Area-P 1/2: 2.95 cm2
S' Lateral: 2.6 cm

## 2020-07-15 DIAGNOSIS — M7061 Trochanteric bursitis, right hip: Secondary | ICD-10-CM | POA: Diagnosis not present

## 2020-07-15 DIAGNOSIS — M25561 Pain in right knee: Secondary | ICD-10-CM | POA: Diagnosis not present

## 2020-07-15 DIAGNOSIS — M25452 Effusion, left hip: Secondary | ICD-10-CM | POA: Diagnosis not present

## 2020-07-15 DIAGNOSIS — M7062 Trochanteric bursitis, left hip: Secondary | ICD-10-CM | POA: Diagnosis not present

## 2020-07-15 DIAGNOSIS — R262 Difficulty in walking, not elsewhere classified: Secondary | ICD-10-CM | POA: Diagnosis not present

## 2020-07-15 DIAGNOSIS — M6281 Muscle weakness (generalized): Secondary | ICD-10-CM | POA: Diagnosis not present

## 2020-07-15 DIAGNOSIS — M2560 Stiffness of unspecified joint, not elsewhere classified: Secondary | ICD-10-CM | POA: Diagnosis not present

## 2020-07-15 DIAGNOSIS — M25562 Pain in left knee: Secondary | ICD-10-CM | POA: Diagnosis not present

## 2020-07-18 DIAGNOSIS — M7062 Trochanteric bursitis, left hip: Secondary | ICD-10-CM | POA: Diagnosis not present

## 2020-07-18 DIAGNOSIS — M25561 Pain in right knee: Secondary | ICD-10-CM | POA: Diagnosis not present

## 2020-07-18 DIAGNOSIS — R262 Difficulty in walking, not elsewhere classified: Secondary | ICD-10-CM | POA: Diagnosis not present

## 2020-07-18 DIAGNOSIS — M2560 Stiffness of unspecified joint, not elsewhere classified: Secondary | ICD-10-CM | POA: Diagnosis not present

## 2020-07-18 DIAGNOSIS — M25562 Pain in left knee: Secondary | ICD-10-CM | POA: Diagnosis not present

## 2020-07-18 DIAGNOSIS — M25452 Effusion, left hip: Secondary | ICD-10-CM | POA: Diagnosis not present

## 2020-07-18 DIAGNOSIS — M7061 Trochanteric bursitis, right hip: Secondary | ICD-10-CM | POA: Diagnosis not present

## 2020-07-18 DIAGNOSIS — M6281 Muscle weakness (generalized): Secondary | ICD-10-CM | POA: Diagnosis not present

## 2020-07-21 DIAGNOSIS — M7062 Trochanteric bursitis, left hip: Secondary | ICD-10-CM | POA: Diagnosis not present

## 2020-07-21 DIAGNOSIS — M7061 Trochanteric bursitis, right hip: Secondary | ICD-10-CM | POA: Diagnosis not present

## 2020-07-21 DIAGNOSIS — M2560 Stiffness of unspecified joint, not elsewhere classified: Secondary | ICD-10-CM | POA: Diagnosis not present

## 2020-07-21 DIAGNOSIS — M25562 Pain in left knee: Secondary | ICD-10-CM | POA: Diagnosis not present

## 2020-07-21 DIAGNOSIS — M25452 Effusion, left hip: Secondary | ICD-10-CM | POA: Diagnosis not present

## 2020-07-21 DIAGNOSIS — M25561 Pain in right knee: Secondary | ICD-10-CM | POA: Diagnosis not present

## 2020-07-21 DIAGNOSIS — R262 Difficulty in walking, not elsewhere classified: Secondary | ICD-10-CM | POA: Diagnosis not present

## 2020-07-21 DIAGNOSIS — M6281 Muscle weakness (generalized): Secondary | ICD-10-CM | POA: Diagnosis not present

## 2020-07-25 ENCOUNTER — Telehealth: Payer: Self-pay | Admitting: Hematology

## 2020-07-25 NOTE — Telephone Encounter (Signed)
Scheduled follow up per pt's request during 12/13 incoming call. Pt confirmed appt date and time.

## 2020-07-27 DIAGNOSIS — M25452 Effusion, left hip: Secondary | ICD-10-CM | POA: Diagnosis not present

## 2020-07-27 DIAGNOSIS — R262 Difficulty in walking, not elsewhere classified: Secondary | ICD-10-CM | POA: Diagnosis not present

## 2020-07-27 DIAGNOSIS — M25561 Pain in right knee: Secondary | ICD-10-CM | POA: Diagnosis not present

## 2020-07-27 DIAGNOSIS — M6281 Muscle weakness (generalized): Secondary | ICD-10-CM | POA: Diagnosis not present

## 2020-07-27 DIAGNOSIS — M7062 Trochanteric bursitis, left hip: Secondary | ICD-10-CM | POA: Diagnosis not present

## 2020-07-27 DIAGNOSIS — M7061 Trochanteric bursitis, right hip: Secondary | ICD-10-CM | POA: Diagnosis not present

## 2020-07-27 DIAGNOSIS — M2560 Stiffness of unspecified joint, not elsewhere classified: Secondary | ICD-10-CM | POA: Diagnosis not present

## 2020-07-27 DIAGNOSIS — M25562 Pain in left knee: Secondary | ICD-10-CM | POA: Diagnosis not present

## 2020-07-29 DIAGNOSIS — R262 Difficulty in walking, not elsewhere classified: Secondary | ICD-10-CM | POA: Diagnosis not present

## 2020-07-29 DIAGNOSIS — M25562 Pain in left knee: Secondary | ICD-10-CM | POA: Diagnosis not present

## 2020-07-29 DIAGNOSIS — M6281 Muscle weakness (generalized): Secondary | ICD-10-CM | POA: Diagnosis not present

## 2020-07-29 DIAGNOSIS — M7062 Trochanteric bursitis, left hip: Secondary | ICD-10-CM | POA: Diagnosis not present

## 2020-07-29 DIAGNOSIS — M7061 Trochanteric bursitis, right hip: Secondary | ICD-10-CM | POA: Diagnosis not present

## 2020-07-29 DIAGNOSIS — M25561 Pain in right knee: Secondary | ICD-10-CM | POA: Diagnosis not present

## 2020-07-29 DIAGNOSIS — M2560 Stiffness of unspecified joint, not elsewhere classified: Secondary | ICD-10-CM | POA: Diagnosis not present

## 2020-07-29 DIAGNOSIS — M25452 Effusion, left hip: Secondary | ICD-10-CM | POA: Diagnosis not present

## 2020-08-02 DIAGNOSIS — M2560 Stiffness of unspecified joint, not elsewhere classified: Secondary | ICD-10-CM | POA: Diagnosis not present

## 2020-08-02 DIAGNOSIS — M7062 Trochanteric bursitis, left hip: Secondary | ICD-10-CM | POA: Diagnosis not present

## 2020-08-02 DIAGNOSIS — M7061 Trochanteric bursitis, right hip: Secondary | ICD-10-CM | POA: Diagnosis not present

## 2020-08-02 DIAGNOSIS — M25452 Effusion, left hip: Secondary | ICD-10-CM | POA: Diagnosis not present

## 2020-08-02 DIAGNOSIS — R262 Difficulty in walking, not elsewhere classified: Secondary | ICD-10-CM | POA: Diagnosis not present

## 2020-08-02 DIAGNOSIS — M6281 Muscle weakness (generalized): Secondary | ICD-10-CM | POA: Diagnosis not present

## 2020-08-02 DIAGNOSIS — M25561 Pain in right knee: Secondary | ICD-10-CM | POA: Diagnosis not present

## 2020-08-02 DIAGNOSIS — M25562 Pain in left knee: Secondary | ICD-10-CM | POA: Diagnosis not present

## 2020-08-07 ENCOUNTER — Other Ambulatory Visit: Payer: Self-pay | Admitting: Neurology

## 2020-08-10 ENCOUNTER — Telehealth: Payer: Self-pay | Admitting: Neurology

## 2020-08-10 NOTE — Telephone Encounter (Signed)
Pt called wanting to know why her gabapentin (NEURONTIN) 100 MG capsule was denied. Please advise.

## 2020-08-11 ENCOUNTER — Other Ambulatory Visit: Payer: Self-pay | Admitting: Emergency Medicine

## 2020-08-11 MED ORDER — GABAPENTIN 100 MG PO CAPS: 100.0000 mg | ORAL_CAPSULE | Freq: Three times a day (TID) | ORAL | 1 refills | Status: DC

## 2020-08-16 DIAGNOSIS — E039 Hypothyroidism, unspecified: Secondary | ICD-10-CM | POA: Diagnosis not present

## 2020-08-16 DIAGNOSIS — Z0000 Encounter for general adult medical examination without abnormal findings: Secondary | ICD-10-CM | POA: Diagnosis not present

## 2020-08-16 DIAGNOSIS — E119 Type 2 diabetes mellitus without complications: Secondary | ICD-10-CM | POA: Diagnosis not present

## 2020-08-16 DIAGNOSIS — I10 Essential (primary) hypertension: Secondary | ICD-10-CM | POA: Diagnosis not present

## 2020-08-16 DIAGNOSIS — E785 Hyperlipidemia, unspecified: Secondary | ICD-10-CM | POA: Diagnosis not present

## 2020-08-16 DIAGNOSIS — Z79899 Other long term (current) drug therapy: Secondary | ICD-10-CM | POA: Diagnosis not present

## 2020-08-16 DIAGNOSIS — F411 Generalized anxiety disorder: Secondary | ICD-10-CM | POA: Diagnosis not present

## 2020-08-16 DIAGNOSIS — M170 Bilateral primary osteoarthritis of knee: Secondary | ICD-10-CM | POA: Diagnosis not present

## 2020-08-16 DIAGNOSIS — E1140 Type 2 diabetes mellitus with diabetic neuropathy, unspecified: Secondary | ICD-10-CM | POA: Diagnosis not present

## 2020-08-17 DIAGNOSIS — D2271 Melanocytic nevi of right lower limb, including hip: Secondary | ICD-10-CM | POA: Diagnosis not present

## 2020-08-17 DIAGNOSIS — L821 Other seborrheic keratosis: Secondary | ICD-10-CM | POA: Diagnosis not present

## 2020-08-17 DIAGNOSIS — Z85820 Personal history of malignant melanoma of skin: Secondary | ICD-10-CM | POA: Diagnosis not present

## 2020-08-17 DIAGNOSIS — L820 Inflamed seborrheic keratosis: Secondary | ICD-10-CM | POA: Diagnosis not present

## 2020-08-17 DIAGNOSIS — L111 Transient acantholytic dermatosis [Grover]: Secondary | ICD-10-CM | POA: Diagnosis not present

## 2020-08-17 DIAGNOSIS — D1801 Hemangioma of skin and subcutaneous tissue: Secondary | ICD-10-CM | POA: Diagnosis not present

## 2020-08-17 DIAGNOSIS — D225 Melanocytic nevi of trunk: Secondary | ICD-10-CM | POA: Diagnosis not present

## 2020-08-18 DIAGNOSIS — M7062 Trochanteric bursitis, left hip: Secondary | ICD-10-CM | POA: Diagnosis not present

## 2020-08-19 DIAGNOSIS — Z6824 Body mass index (BMI) 24.0-24.9, adult: Secondary | ICD-10-CM | POA: Diagnosis not present

## 2020-08-19 DIAGNOSIS — E1140 Type 2 diabetes mellitus with diabetic neuropathy, unspecified: Secondary | ICD-10-CM | POA: Diagnosis not present

## 2020-08-19 DIAGNOSIS — G4710 Hypersomnia, unspecified: Secondary | ICD-10-CM | POA: Diagnosis not present

## 2020-08-24 DIAGNOSIS — R059 Cough, unspecified: Secondary | ICD-10-CM | POA: Diagnosis not present

## 2020-08-30 ENCOUNTER — Ambulatory Visit: Payer: Medicare PPO | Admitting: Neurology

## 2020-09-01 ENCOUNTER — Telehealth: Payer: Self-pay | Admitting: Neurology

## 2020-09-01 NOTE — Telephone Encounter (Signed)
Noted thanks, I now have Yvonne [name] scheduled with Dr Jannifer Franklin on Monday 09/12/20 at 12:00 pm and I canceled the June appt.

## 2020-09-01 NOTE — Telephone Encounter (Signed)
Okay to add to work inside at 12 noon or at 7:30 AM

## 2020-09-01 NOTE — Telephone Encounter (Signed)
Pt. is asking if she can be worked in as it was not her fault that our office was closed due to the weather. She is scheduled for June & has been added to the waiting list, but she is not happy with this outcome. Please advise.

## 2020-09-05 DIAGNOSIS — G4719 Other hypersomnia: Secondary | ICD-10-CM | POA: Diagnosis not present

## 2020-09-05 DIAGNOSIS — E1140 Type 2 diabetes mellitus with diabetic neuropathy, unspecified: Secondary | ICD-10-CM | POA: Diagnosis not present

## 2020-09-05 DIAGNOSIS — I10 Essential (primary) hypertension: Secondary | ICD-10-CM | POA: Diagnosis not present

## 2020-09-05 DIAGNOSIS — E785 Hyperlipidemia, unspecified: Secondary | ICD-10-CM | POA: Diagnosis not present

## 2020-09-12 ENCOUNTER — Telehealth: Payer: Self-pay | Admitting: Neurology

## 2020-09-12 ENCOUNTER — Ambulatory Visit: Payer: Self-pay | Admitting: Neurology

## 2020-09-12 DIAGNOSIS — Z20822 Contact with and (suspected) exposure to covid-19: Secondary | ICD-10-CM | POA: Diagnosis not present

## 2020-09-12 NOTE — Telephone Encounter (Signed)
Pt. had to cancel appt. today as she's been exposed to covid. She's asking for a sooner appt. Please advise.

## 2020-09-12 NOTE — Telephone Encounter (Signed)
Spoke with patient and was able to schedule her for Wednesday 2/16 @ 12:00 arrival 1130-1145. Pt was exposed last week, currently doing well.

## 2020-09-23 DIAGNOSIS — I10 Essential (primary) hypertension: Secondary | ICD-10-CM | POA: Diagnosis not present

## 2020-09-23 DIAGNOSIS — E785 Hyperlipidemia, unspecified: Secondary | ICD-10-CM | POA: Diagnosis not present

## 2020-09-23 DIAGNOSIS — G4733 Obstructive sleep apnea (adult) (pediatric): Secondary | ICD-10-CM | POA: Diagnosis not present

## 2020-09-23 NOTE — Progress Notes (Incomplete)
[place]   [phone] [phone]   Clinic Follow up Note   Patient Care Team: Kathyrn Lass, MD as PCP - General (Family Medicine) Garrel Ridgel, DPM as Consulting Physician (Podiatry) Darlin Coco, MD as Consulting Physician (Cardiology) [name], MD as Attending Physician (Gastroenterology)   I connected with Yvonne [name] on 09/23/2020 at  9:20 AM EST by {Blank single:19197::"video enabled telemedicine visit","telephone visit"} and verified that I am speaking with the correct person using two identifiers.  I discussed the limitations, risks, security and privacy concerns of performing an evaluation and management service by telephone and the availability of in person appointments. I also discussed with the patient that there may be a patient responsible charge related to this service. The patient expressed understanding and agreed to proceed.   Other persons participating in the visit and their role in the encounter:  ***  Patient's location:  *** Provider's location:  ***  CHIEF COMPLAINT: Family history ofpancreatic cancer, high risk    INTERVAL HISTORY: *** Yvonne [name] is here for a follow up. She was last seen by me in 10/2019. {They identified themselves by birth date/face to face video.}    REVIEW OF SYSTEMS:  *** Constitutional: Denies fevers, chills or abnormal weight loss Eyes: Denies blurriness of vision Ears, nose, mouth, throat, and face: Denies mucositis or sore throat Respiratory: Denies cough, dyspnea or wheezes Cardiovascular: Denies palpitation, chest discomfort or lower extremity swelling Gastrointestinal:  Denies nausea, heartburn or change in bowel habits Skin: Denies abnormal skin rashes Lymphatics: Denies new lymphadenopathy or easy bruising Neurological:Denies numbness, tingling or new weaknesses Behavioral/Psych: Mood is stable, no new changes  All other systems were reviewed with the patient  and are negative.  MEDICAL HISTORY:  Past Medical History:  Diagnosis Date  . Anxiety   . Chest pressure   . Degenerative arthritis   . Deviated septum   . Diabetes mellitus without complication (HCC)    diet controlled  . Diabetic neuropathy ([place]) 12/03/2017  . DJD (degenerative joint disease) of knee   . Graves disease    RADIOACTIVE IODINE 1983  . H/O hiatal hernia   . Heart palpitations   . History of diastolic dysfunction    per echo in 2011  . Hyperlipidemia   . Hypertension   . Hypothyroidism   . LVH (left ventricular hypertrophy)    per echo in 2011  . Mitral valve prolapse   . Neuropathy, peripheral    DR. Jannifer Franklin 12/15  . Normal nuclear stress test 2011  . Polyneuropathy in other diseases classified elsewhere ([place]) 05/21/2013  . Rosacea 2019   MELANOMA CHEST REMOVED IN SITU  . Sleep difficulties   . Tinnitus     SURGICAL HISTORY: Past Surgical History:  Procedure Laterality Date  . ABDOMINAL HYSTERECTOMY  2000   TOTAL  . APPENDECTOMY  1989  . CARDIOVASCULAR STRESS TEST  11/09/2009   EF 75%  . CARPAL TUNNEL RELEASE  2002   bilateral  . COLONOSCOPY WITH PROPOFOL N/A 10/23/2018   Procedure: COLONOSCOPY WITH PROPOFOL;  Surgeon: [name], MD;  [facility];  Service: Endoscopy;  Laterality: N/A;  . ESOPHAGOGASTRODUODENOSCOPY (EGD) WITH PROPOFOL N/A 10/23/2018   Procedure: ESOPHAGOGASTRODUODENOSCOPY (EGD) WITH PROPOFOL;  Surgeon: [name], MD;  [facility];  Service: Endoscopy;  Laterality: N/A;  . EUS N/A 10/23/2018   Procedure: UPPER ENDOSCOPIC ULTRASOUND (EUS) RADIAL;  Surgeon: [name], MD;  [facility];  Service: Endoscopy;  Laterality: N/A;  . MASS EXCISION Left 06/02/2019   Procedure: EXCISION OF SUBCUTANEOUS ABDOMINAL WALL MASSES LEFT LOWER QUADRANT;  Surgeon: [name], MD;  Location: [facility];  Service: General;  Laterality: Left;  . TONSILLECTOMY  1958  . TRANSTHORACIC ECHOCARDIOGRAM  11/01/2009    NORMAL LV FILLING AND DIASTOLIC DYSFUNCTION AND MILD AORTIC SCLEROSIS AND TRACE MITRAL REGURGITATION    I have reviewed the social history and family history with the patient and they are unchanged from previous note.  ALLERGIES:  is allergic to tobramycin, benadryl [diphenhydramine], claritin [loratadine], prednisone, elemental sulfur, and erythromycin.  MEDICATIONS:  Current Outpatient Medications  Medication Sig Dispense Refill  . acetaminophen (TYLENOL) 325 MG tablet Take 650 mg by mouth every 6 (six) hours as needed.    . ALPRAZolam (XANAX) 0.25 MG tablet Take 0.25 mg by mouth daily as needed for anxiety.     Marland Kitchen aspirin 81 MG tablet Take 81 mg by mouth daily.      . Cholecalciferol (VITAMIN D) 50 MCG (2000 UT) tablet Take 2,000 Units by mouth daily.     . fluticasone (FLONASE) 50 MCG/ACT nasal spray Place 2 sprays into both nostrils daily as needed for allergies or rhinitis.     . furosemide (LASIX) 20 MG tablet Take 10 mg by mouth daily.    Marland Kitchen gabapentin (NEURONTIN) 100 MG capsule Take 1 capsule (100 mg total) by mouth 3 (three) times daily. PRN 90 capsule 1  . gabapentin (NEURONTIN) 300 MG capsule Take 300 mg by mouth at bedtime.     . Influenza Vac High-Dose Quad (FLUZONE HIGH-DOSE QUADRIVALENT) 0.7 ML SUSY Fluzone High-Dose Quad 2020-21 (PF) 240 mcg/0.7 mL IM syringe  PHARMACY ADMINISTERED    . levothyroxine (SYNTHROID, LEVOTHROID) 100 MCG tablet Take 100 mcg by mouth daily before breakfast.    . metoprolol succinate (TOPROL-XL) 25 MG 24 hr tablet TAKE 1/2 TABLET BY MOUTH DAILY. 45 tablet 3  . metroNIDAZOLE (METROCREAM) 0.75 % cream Apply 1 application topically daily.     . potassium chloride SA (K-DUR,KLOR-CON) 20 MEQ tablet TAKE 1/2 TABLET BY MOUTH DAILY. 45 tablet 3  . Prenatal Vit-Fe Fumarate-FA (PRENATAL MULTIVITAMIN) TABS tablet Take 1 tablet by mouth daily at 12 noon.    . rosuvastatin (CRESTOR) 5 MG tablet Take 1 tablet (5 mg total) by mouth daily. 90 tablet 3   No current  facility-administered medications for this visit.    PHYSICAL EXAMINATION: ECOG PERFORMANCE STATUS: {CHL ONC ECOG [phone]}  No vitals taken today, Exam not performed today   LABORATORY DATA:  I have reviewed the data as listed CBC Latest Ref Rng & Units 10/23/2019 05/26/2019 09/19/2018  WBC 4.0 - 10.5 K/uL 6.3 5.2 6.1  Hemoglobin 12.0 - 15.0 g/dL 13.3 13.9 14.0  Hematocrit 36.0 - 46.0 % 38.6 41.7 41.3  Platelets 150 - 400 K/uL 192 191 181     CMP Latest Ref Rng & Units 10/23/2019 05/26/2019 09/19/2018  Glucose 70 - 99 mg/dL 152(H) 243(H) 130(H)  BUN 8 - 23 mg/dL '12 12 12  ' Creatinine 0.44 - 1.00 mg/dL 0.76 0.68 0.74  Sodium 135 - 145 mmol/L 140 138 141  Potassium 3.5 - 5.1 mmol/L 4.1 4.3 4.0  Chloride 98 - 111 mmol/L 104 103 105  CO2 22 - 32 mmol/L '28 24 27  ' Calcium 8.9 - 10.3 mg/dL 8.9 9.2 9.4  Total Protein 6.5 - 8.1 g/dL 6.7 - 7.3  Total Bilirubin 0.3 - 1.2 mg/dL 0.5 - 0.8  Alkaline  Phos 38 - 126 U/L 57 - 59  AST 15 - 41 U/L 14(L) - 18  ALT 0 - 44 U/L 17 - 24      RADIOGRAPHIC STUDIES: I have personally reviewed the radiological images as listed and agreed with the findings in the report. No results found.   ASSESSMENT & PLAN:  ZEHRA RUCCI is a 75 y.o. female with   1. Strong family history of pancreatic cancer, negative genetic testing -She has a very strong family history of malignancy, especially pancreatic cancer.  Her brother died of pancreatic cancer at age of 37, and 2 of her maternal cousins had breast cancer in their 96s.  She also has family history of breast cancer, uterine cancer, brain tumor and pancreatic neuroendocrine tumor.  -Her extensive genetic testing, including BRCA1, BRCA2, and Lynch syndrome, was negative, we reviewed. -She is certainly at high risk for pancreatic cancer, due to her strong family history.  Although she does not meet the criteria for familial pancreatic cancer -According to NCCN guideline, she is considered high risk  individual and qualify for pancreatic cancer screening, with MRI/MRCP or EUS annually.   -Her screening colonoscopy and endoscopy with Dr. Ardis Hughs in 10/2018 was normal except stable pancreatic cyst  -I personally reviewed her MRI abdomen from 10/26/19 which shows 10cm cystic of her pancreatic tail, most likely benign. No other concerning features. Labs reviewed, CBC and CMP WNL except BG 152. CA 19.9 has remained normal. Overall no indication for pancreatic cancer.  -Will continue surveillance. Repeat endoscopy in 2022 and repeat MRI in 2023.  -f/u in 2 years. She will f/u with Dr. Ardis Hughs in interim.  -She has received both her [place] vaccinations. I encouraged her to practice deep breathing daily. I advised her to watch for uncontrolled BG level, significant abdominal pain, and jaundice.     2. Diabetic polyneuropathy associated with type 2 diabetes mellitus -On 100 mg gabapentin. I discussed she can also use Vit B and exercise.  -She notes her A1c has fluctuated up to highest 7 range in the past 10 years. Her latest A1c at 6.7 in 07/2019.  -I recommend if she wants to avoid medication treatment she can work on losing weight with healthy diet high in protein and exercise. She should check her BG before breakfast after lunch and after dinner daily.  -I recommend she f/u with her PCP for her other health management.    Plan  -lab and MRI reviewed, no concern for pancreatic cancer  -F/u in 2 years with lab and MRI abdomen a few days before. She will f/u with Dr. Ardis Hughs in interim for next endoscopy in 2021.    No problem-specific Assessment & Plan notes found for this encounter.   No orders of the defined types were placed in this encounter.  I discussed the assessment and treatment plan with the patient. The patient was provided an opportunity to ask questions and all were answered. The patient agreed with the plan and demonstrated an understanding of the instructions.  The patient was  advised to call back or seek an in-person evaluation if the symptoms worsen or if the condition fails to improve as anticipated.  The total time spent in the appointment was {CHL ONC TIME VISIT - [id number]}.    Yvonne [name] 09/23/2020   Oneal Deputy, am acting as scribe for Truitt Merle, MD.   {Add scribe attestation statement}

## 2020-09-26 ENCOUNTER — Inpatient Hospital Stay: Payer: Medicare PPO | Admitting: Hematology

## 2020-09-26 ENCOUNTER — Other Ambulatory Visit: Payer: Self-pay

## 2020-09-26 ENCOUNTER — Inpatient Hospital Stay: Payer: Medicare PPO | Attending: Hematology

## 2020-09-26 DIAGNOSIS — Z803 Family history of malignant neoplasm of breast: Secondary | ICD-10-CM | POA: Diagnosis not present

## 2020-09-26 DIAGNOSIS — K862 Cyst of pancreas: Secondary | ICD-10-CM

## 2020-09-26 LAB — CBC WITH DIFFERENTIAL (CANCER CENTER ONLY)
Abs Immature Granulocytes: 0.02 10*3/uL (ref 0.00–0.07)
Basophils Absolute: 0.1 10*3/uL (ref 0.0–0.1)
Basophils Relative: 1 %
Eosinophils Absolute: 0.3 10*3/uL (ref 0.0–0.5)
Eosinophils Relative: 4 %
HCT: 39 % (ref 36.0–46.0)
Hemoglobin: 13.5 g/dL (ref 12.0–15.0)
Immature Granulocytes: 0 %
Lymphocytes Relative: 23 %
Lymphs Abs: 1.6 10*3/uL (ref 0.7–4.0)
MCH: 28.4 pg (ref 26.0–34.0)
MCHC: 34.6 g/dL (ref 30.0–36.0)
MCV: 81.9 fL (ref 80.0–100.0)
Monocytes Absolute: 0.6 10*3/uL (ref 0.1–1.0)
Monocytes Relative: 8 %
Neutro Abs: 4.4 10*3/uL (ref 1.7–7.7)
Neutrophils Relative %: 64 %
Platelet Count: 184 10*3/uL (ref 150–400)
RBC: 4.76 MIL/uL (ref 3.87–5.11)
RDW: 13.2 % (ref 11.5–15.5)
WBC Count: 7 10*3/uL (ref 4.0–10.5)
nRBC: 0 % (ref 0.0–0.2)

## 2020-09-26 LAB — CMP (CANCER CENTER ONLY)
ALT: 25 U/L (ref 0–44)
AST: 17 U/L (ref 15–41)
Albumin: 4 g/dL (ref 3.5–5.0)
Alkaline Phosphatase: 58 U/L (ref 38–126)
Anion gap: 8 (ref 5–15)
BUN: 11 mg/dL (ref 8–23)
CO2: 27 mmol/L (ref 22–32)
Calcium: 9.1 mg/dL (ref 8.9–10.3)
Chloride: 103 mmol/L (ref 98–111)
Creatinine: 0.78 mg/dL (ref 0.44–1.00)
GFR, Estimated: >60 mL/min (ref >60)
Glucose, Bld: 239 mg/dL — ABNORMAL HIGH (ref 70–99)
Potassium: 4.1 mmol/L (ref 3.5–5.1)
Sodium: 138 mmol/L (ref 135–145)
Total Bilirubin: 0.4 mg/dL (ref 0.3–1.2)
Total Protein: 6.7 g/dL (ref 6.5–8.1)

## 2020-09-27 ENCOUNTER — Telehealth: Payer: Self-pay | Admitting: Nurse Practitioner

## 2020-09-27 LAB — CANCER ANTIGEN 19-9: CA 19-9: 6 U/mL (ref 0–35)

## 2020-09-27 NOTE — Telephone Encounter (Signed)
Scheduled appt per 2/14 sch msg- pt is aware of appt date and time   

## 2020-09-28 ENCOUNTER — Other Ambulatory Visit: Payer: Self-pay

## 2020-09-28 ENCOUNTER — Ambulatory Visit: Payer: Medicare PPO | Admitting: Neurology

## 2020-09-28 ENCOUNTER — Encounter: Payer: Self-pay | Admitting: Neurology

## 2020-09-28 VITALS — Ht 66.0 in | Wt 155.4 lb

## 2020-09-28 DIAGNOSIS — G63 Polyneuropathy in diseases classified elsewhere: Secondary | ICD-10-CM

## 2020-09-28 DIAGNOSIS — E1142 Type 2 diabetes mellitus with diabetic polyneuropathy: Secondary | ICD-10-CM | POA: Diagnosis not present

## 2020-09-28 NOTE — Progress Notes (Signed)
Reason for visit: Peripheral neuropathy, diabetes  Yvonne [name] is an 75 y.o. female  History of present illness:  Yvonne [name] is a 75 year old right-handed white female with a history of diabetes.  Her most recent hemoglobin A1c was 6.9 in December 2021.  The patient has a cold sensation in the feet that is quite bothersome to her.  She takes 300 mg of gabapentin in the evening, sometimes 400 mg.  She recently was found to have sleep apnea, she will be going on CPAP in the near future.  The patient never tried the capsaicin cream for the feet.  She wears socks all the time, even while sleeping at night.  She denies any severe balance issues, she has not had any falls.  She returns for further evaluation.  Past Medical History:  Diagnosis Date  . Anxiety   . Chest pressure   . Degenerative arthritis   . Deviated septum   . Diabetes mellitus without complication (HCC)    diet controlled  . Diabetic neuropathy ([place]) 12/03/2017  . DJD (degenerative joint disease) of knee   . Graves disease    RADIOACTIVE IODINE 1983  . H/O hiatal hernia   . Heart palpitations   . History of diastolic dysfunction    per echo in 2011  . Hyperlipidemia   . Hypertension   . Hypothyroidism   . LVH (left ventricular hypertrophy)    per echo in 2011  . Mitral valve prolapse   . Neuropathy, peripheral    DR. Jannifer Franklin 12/15  . Normal nuclear stress test 2011  . OSA (obstructive sleep apnea)   . Polyneuropathy in other diseases classified elsewhere ([place]) 05/21/2013  . Rosacea 2019   MELANOMA CHEST REMOVED IN SITU  . Sleep difficulties   . Tinnitus     Past Surgical History:  Procedure Laterality Date  . ABDOMINAL HYSTERECTOMY  2000   TOTAL  . APPENDECTOMY  1989  . CARDIOVASCULAR STRESS TEST  11/09/2009   EF 75%  . CARPAL TUNNEL RELEASE  2002   bilateral  . COLONOSCOPY WITH PROPOFOL N/A 10/23/2018   Procedure: COLONOSCOPY WITH PROPOFOL;  Surgeon: [name], MD;  [facility]: [facility];  Service: Endoscopy;  Laterality: N/A;  . ESOPHAGOGASTRODUODENOSCOPY (EGD) WITH PROPOFOL N/A 10/23/2018   Procedure: ESOPHAGOGASTRODUODENOSCOPY (EGD) WITH PROPOFOL;  Surgeon: [name], MD;  [facility];  Service: Endoscopy;  Laterality: N/A;  . EUS N/A 10/23/2018   Procedure: UPPER ENDOSCOPIC ULTRASOUND (EUS) RADIAL;  Surgeon: [name], MD;  [facility];  Service: Endoscopy;  Laterality: N/A;  . MASS EXCISION Left 06/02/2019   Procedure: EXCISION OF SUBCUTANEOUS ABDOMINAL WALL MASSES LEFT LOWER QUADRANT;  Surgeon: [name], MD;  Location: [facility];  Service: General;  Laterality: Left;  . TONSILLECTOMY  1958  . TRANSTHORACIC ECHOCARDIOGRAM  11/01/2009   NORMAL LV FILLING AND DIASTOLIC DYSFUNCTION AND MILD AORTIC SCLEROSIS AND TRACE MITRAL REGURGITATION    Family History  Problem Relation Age of Onset  . Hypertension Mother   . Heart disease Mother   . Dementia Mother   . Heart disease Father   . Arthritis Father   . Diabetes Father   . Hypertension Father   . Cancer Brother 54       pancreatic  . Other Brother        automobile accident  . Stroke Maternal Grandmother   . Heart disease Maternal Grandfather   . Cancer Paternal Aunt  malignant brain cancer  . Cancer Other 49       pancreatic neuroendocrine carcinoma  . Cancer Cousin 64       pancreatic  . Cancer Cousin 60       pancreatic  . Cancer Cousin 52       breast  . Cancer Cousin 67       uterine  . Cancer Cousin 75       unknown primary  . Colon cancer Neg Hx     Social history:  reports that she quit smoking about 45 years ago. She has a 5.00 pack-year smoking history. She has never used smokeless tobacco. She reports current alcohol use. She reports that she does not use drugs.    Allergies  Allergen Reactions  . Tobramycin Hives  . Elemental Sulfur Nausea Only  . Erythromycin Nausea Only    Medications:  Prior to Admission medications   Medication  Sig Start Date End Date Taking? Authorizing Provider  acetaminophen (TYLENOL) 325 MG tablet Take 650 mg by mouth every 6 (six) hours as needed.   Yes [provider]  ALPRAZolam (XANAX) 0.25 MG tablet Take 0.25 mg by mouth daily as needed for anxiety.    Yes [provider]  Cholecalciferol (VITAMIN D) 50 MCG (2000 UT) tablet Take 2,000 Units by mouth daily.    Yes [provider]  fluticasone (FLONASE) 50 MCG/ACT nasal spray Place 2 sprays into both nostrils daily as needed for allergies or rhinitis.    Yes [provider]  furosemide (LASIX) 20 MG tablet Take 10 mg by mouth daily.   Yes [provider]  gabapentin (NEURONTIN) 100 MG capsule Take 1 capsule (100 mg total) by mouth 3 (three) times daily. PRN 08/11/20  Yes Kathrynn Ducking, MD  gabapentin (NEURONTIN) 300 MG capsule Take 300 mg by mouth at bedtime.    Yes Kathyrn Lass, MD  Influenza Vac High-Dose Quad (FLUZONE HIGH-DOSE QUADRIVALENT) 0.7 ML SUSY Fluzone High-Dose Quad 2020-21 (PF) 240 mcg/0.7 mL IM syringe  PHARMACY ADMINISTERED   Yes [provider]  levothyroxine (SYNTHROID, LEVOTHROID) 100 MCG tablet Take 100 mcg by mouth daily before breakfast.   Yes [provider]  metoprolol succinate (TOPROL-XL) 25 MG 24 hr tablet TAKE 1/2 TABLET BY MOUTH DAILY. 11/26/14  Yes Darlin Coco, MD  metroNIDAZOLE (METROCREAM) 0.75 % cream Apply 1 application topically daily.  12/21/14  Yes [provider]  potassium chloride SA (K-DUR,KLOR-CON) 20 MEQ tablet TAKE 1/2 TABLET BY MOUTH DAILY. 09/30/15  Yes Darlin Coco, MD  Prenatal Vit-Fe Fumarate-FA (PRENATAL MULTIVITAMIN) TABS tablet Take 1 tablet by mouth daily at 12 noon.   Yes [provider]  rosuvastatin (CRESTOR) 5 MG tablet Take 1 tablet (5 mg total) by mouth daily. 07/04/15  Yes Darlin Coco, MD  aspirin 81 MG tablet Take 81 mg by mouth daily.    [provider]    ROS:  Out of a  complete 14 system review of symptoms, the patient complains only of the following symptoms, and all other reviewed systems are negative.  Coldness in the feet Fatigue  Height 5\' 6"  (1.676 m), weight 155 lb 6.4 oz (70.5 kg).  Physical Exam  General: The patient is alert and cooperative at the time of the examination.  Skin: No significant peripheral edema is noted.   Neurologic Exam  Mental status: The patient is alert and oriented x 3 at the time of the examination. The patient has apparent normal recent  and remote memory, with an apparently normal attention span and concentration ability.   Cranial nerves: Facial symmetry is present. Speech is normal, no aphasia or dysarthria is noted. Extraocular movements are full. Visual fields are full.  Motor: The patient has good strength in all 4 extremities.  Sensory examination: Soft touch sensation is symmetric on the face, arms, and legs.  Coordination: The patient has good finger-nose-finger and heel-to-shin bilaterally.  Gait and station: The patient has a normal gait. Tandem gait is normal. Romberg is negative. No drift is seen.  Reflexes: Deep tendon reflexes are symmetric, but are depressed.   Assessment/Plan:  1.  Diabetic peripheral neuropathy  The patient continues to have some discomfort in the feet with a cold sensation.  She may try the capsaicin cream, she could potentially try laser treatment for the neuropathy.  She will begin taking 100 mg of the gabapentin around 6 PM and 300 mg in the evening.  She will consider taking alpha lipoic acid 600 mg daily.  She will follow up here in 6 months.  Jill Alexanders MD 09/28/2020 12:48 PM  [facility] [address] [place],  [address]  Phone [phone] Fax [phone]

## 2020-09-28 NOTE — Patient Instructions (Signed)
Try capsaicin cream for the foot discomfort.  May try alpha lipoic acid 600 mg a day for the neuropathy.

## 2020-10-03 ENCOUNTER — Telehealth: Payer: Self-pay | Admitting: Nurse Practitioner

## 2020-10-03 NOTE — Telephone Encounter (Signed)
Called to move upcoming appointment per provider's template. Cancelled appointment due to scan being rescheduled. Informed patient we will call her back to reschedule appointment after scan. Patient is aware of changes.

## 2020-10-12 DIAGNOSIS — G4733 Obstructive sleep apnea (adult) (pediatric): Secondary | ICD-10-CM | POA: Diagnosis not present

## 2020-10-12 DIAGNOSIS — Z6825 Body mass index (BMI) 25.0-25.9, adult: Secondary | ICD-10-CM | POA: Diagnosis not present

## 2020-10-12 DIAGNOSIS — G629 Polyneuropathy, unspecified: Secondary | ICD-10-CM | POA: Diagnosis not present

## 2020-10-12 DIAGNOSIS — E1140 Type 2 diabetes mellitus with diabetic neuropathy, unspecified: Secondary | ICD-10-CM | POA: Diagnosis not present

## 2020-10-15 ENCOUNTER — Other Ambulatory Visit: Payer: Medicare PPO

## 2020-10-18 ENCOUNTER — Telehealth: Payer: Medicare PPO | Admitting: Nurse Practitioner

## 2020-10-21 ENCOUNTER — Other Ambulatory Visit: Payer: Self-pay

## 2020-10-21 ENCOUNTER — Telehealth: Payer: Self-pay

## 2020-10-21 DIAGNOSIS — K862 Cyst of pancreas: Secondary | ICD-10-CM

## 2020-10-21 NOTE — Telephone Encounter (Signed)
EUS scheduled, pt instructed and medications reviewed.  Patient instructions mailed to home.  Patient to call with any questions or concerns.   Dr Ardis Hughs the pt states she is having an MRI on Sunday per Dr Burr Medico.  She wants to be sure that she needs both the MRI and EUS.  Also, she is complaining of gas and bloating after eating.  I suggested gas ex before meals.

## 2020-10-21 NOTE — Telephone Encounter (Signed)
Per recall the pt needs to have EUS for pancreatic cyst f/u.  Appt made for 11/24/20 at 1045 am at [facility] with Dr Ardis Hughs.   Left message on machine to call back

## 2020-10-23 ENCOUNTER — Ambulatory Visit
Admission: RE | Admit: 2020-10-23 | Discharge: 2020-10-23 | Disposition: A | Discharge status: Home / Self Care | Payer: Medicare PPO | Source: Ambulatory Visit | Attending: Hematology | Admitting: Hematology

## 2020-10-23 ENCOUNTER — Other Ambulatory Visit: Payer: Self-pay

## 2020-10-23 DIAGNOSIS — K862 Cyst of pancreas: Secondary | ICD-10-CM

## 2020-10-23 MED ORDER — GADOBENATE DIMEGLUMINE 529 MG/ML IV SOLN
14.0000 mL | Freq: Once | INTRAVENOUS | Status: AC | PRN
  Administered 2020-10-23: 14 mL via INTRAVENOUS

## 2020-10-24 ENCOUNTER — Telehealth: Payer: Self-pay | Admitting: Gastroenterology

## 2020-10-24 DIAGNOSIS — R6889 Other general symptoms and signs: Secondary | ICD-10-CM | POA: Diagnosis not present

## 2020-10-24 NOTE — Telephone Encounter (Signed)
Inbound call from patient stating she had a MRI yesterday and is wanting Dr. Ardis Hughs to review please and advise on whether she should still have ultrasound and endoscopy.

## 2020-10-24 NOTE — Telephone Encounter (Signed)
Dr Ardis Hughs the pt is aware that you are out of town this week.  She would like you to review the MRI she had on 3/13.

## 2020-10-24 NOTE — Progress Notes (Signed)
[place]   [phone] [phone]   Clinic Follow up Note   Patient Care Team: Kathyrn Lass, MD as PCP - General (Family Medicine) Garrel Ridgel, DPM as Consulting Physician (Podiatry) Darlin Coco, MD as Consulting Physician (Cardiology) [name], MD as Attending Physician (Gastroenterology) 10/24/2020  I connected with Laurena Bering on 10/25/20 at  9:00 AM EDT by telephone visit and verified that I am speaking with the correct person using two identifiers.   I discussed the limitations, risks, security and privacy concerns of performing an evaluation and management service by telemedicine and the availability of in-person appointments. I also discussed with the patient that there may be a patient responsible charge related to this service. The patient expressed understanding and agreed to proceed.   Other persons participating in the visit and their role in the encounter: None   Patient's location: Home Provider's location: [place] office   CHIEF COMPLAINT: F/up high risk pancreas patient   CURRENT THERAPY: Screening MRI and EUS  INTERVAL HISTORY: Ms. Stoney presents virtually as scheduled. She underwent screening MRI 10/23/20 that shows stable scattered benign pancreatic cysts vs IPMNs. She is scheduled for EUS on 4/14 by Dr. Ardis Hughs but waiting to hear from his office to see if she needs it. She is doing well. Energy and appetite are normal. She avoids sweets and limits alcohol. Her neuropathy is getting worse and now HgA1c up to 6.9 so she is going to start metformin. She learned she has sleep apnea, CPAP is back-ordered not sure how long it will be until she gets it. She is fatigued from OSA. She has gas but no abdominal pain. Knows she eats a lot of "gassy foods." Denies unintentional weight loss, back pain, bloating, signs of juandice, or other new complaints.     MEDICAL HISTORY:  Past Medical History:  Diagnosis Date  .  Anxiety   . Chest pressure   . Degenerative arthritis   . Deviated septum   . Diabetes mellitus without complication (HCC)    diet controlled  . Diabetic neuropathy ([place]) 12/03/2017  . DJD (degenerative joint disease) of knee   . Graves disease    RADIOACTIVE IODINE 1983  . H/O hiatal hernia   . Heart palpitations   . History of diastolic dysfunction    per echo in 2011  . Hyperlipidemia   . Hypertension   . Hypothyroidism   . LVH (left ventricular hypertrophy)    per echo in 2011  . Mitral valve prolapse   . Neuropathy, peripheral    DR. Jannifer Franklin 12/15  . Normal nuclear stress test 2011  . OSA (obstructive sleep apnea)   . Polyneuropathy in other diseases classified elsewhere ([place]) 05/21/2013  . Rosacea 2019   MELANOMA CHEST REMOVED IN SITU  . Sleep difficulties   . Tinnitus     SURGICAL HISTORY: Past Surgical History:  Procedure Laterality Date  . ABDOMINAL HYSTERECTOMY  2000   TOTAL  . APPENDECTOMY  1989  . CARDIOVASCULAR STRESS TEST  11/09/2009   EF 75%  . CARPAL TUNNEL RELEASE  2002   bilateral  . COLONOSCOPY WITH PROPOFOL N/A 10/23/2018   Procedure: COLONOSCOPY WITH PROPOFOL;  Surgeon: [name], MD;  [facility];  Service: Endoscopy;  Laterality: N/A;  . ESOPHAGOGASTRODUODENOSCOPY (EGD) WITH PROPOFOL N/A 10/23/2018   Procedure: ESOPHAGOGASTRODUODENOSCOPY (EGD) WITH PROPOFOL;  Surgeon: [name], MD;  [facility];  Service: Endoscopy;  Laterality: N/A;  . EUS N/A  10/23/2018   Procedure: UPPER ENDOSCOPIC ULTRASOUND (EUS) RADIAL;  Surgeon: [name], MD;  [facility];  Service: Endoscopy;  Laterality: N/A;  . MASS EXCISION Left 06/02/2019   Procedure: EXCISION OF SUBCUTANEOUS ABDOMINAL WALL MASSES LEFT LOWER QUADRANT;  Surgeon: [name], MD;  Location: [place];  Service: General;  Laterality: Left;  . TONSILLECTOMY  1958  . TRANSTHORACIC ECHOCARDIOGRAM  11/01/2009   NORMAL LV FILLING AND DIASTOLIC DYSFUNCTION AND  MILD AORTIC SCLEROSIS AND TRACE MITRAL REGURGITATION    I have reviewed the social history and family history with the patient and they are unchanged from previous note.  ALLERGIES:  is allergic to tobramycin, elemental sulfur, and erythromycin.  MEDICATIONS:  Current Outpatient Medications  Medication Sig Dispense Refill  . acetaminophen (TYLENOL) 325 MG tablet Take 650 mg by mouth every 6 (six) hours as needed.    . ALPRAZolam (XANAX) 0.25 MG tablet Take 0.25 mg by mouth daily as needed for anxiety.     Marland Kitchen aspirin 81 MG tablet Take 81 mg by mouth daily.    . Cholecalciferol (VITAMIN D) 50 MCG (2000 UT) tablet Take 2,000 Units by mouth daily.     . fluticasone (FLONASE) 50 MCG/ACT nasal spray Place 2 sprays into both nostrils daily as needed for allergies or rhinitis.     . furosemide (LASIX) 20 MG tablet Take 10 mg by mouth daily.    Marland Kitchen gabapentin (NEURONTIN) 100 MG capsule Take 1 capsule (100 mg total) by mouth 3 (three) times daily. PRN 90 capsule 1  . gabapentin (NEURONTIN) 300 MG capsule Take 300 mg by mouth at bedtime.     . Influenza Vac High-Dose Quad (FLUZONE HIGH-DOSE QUADRIVALENT) 0.7 ML SUSY Fluzone High-Dose Quad 2020-21 (PF) 240 mcg/0.7 mL IM syringe  PHARMACY ADMINISTERED    . levothyroxine (SYNTHROID, LEVOTHROID) 100 MCG tablet Take 100 mcg by mouth daily before breakfast.    . metoprolol succinate (TOPROL-XL) 25 MG 24 hr tablet TAKE 1/2 TABLET BY MOUTH DAILY. 45 tablet 3  . metroNIDAZOLE (METROCREAM) 0.75 % cream Apply 1 application topically daily.     . potassium chloride SA (K-DUR,KLOR-CON) 20 MEQ tablet TAKE 1/2 TABLET BY MOUTH DAILY. 45 tablet 3  . Prenatal Vit-Fe Fumarate-FA (PRENATAL MULTIVITAMIN) TABS tablet Take 1 tablet by mouth daily at 12 noon.    . rosuvastatin (CRESTOR) 5 MG tablet Take 1 tablet (5 mg total) by mouth daily. 90 tablet 3   No current facility-administered medications for this visit.    PHYSICAL EXAMINATION: ECOG PERFORMANCE STATUS: 0 -  Asymptomatic  There were no vitals filed for this visit. There were no vitals filed for this visit.  Patient appears well over the phone.  Voice is strong, speech is clear.  Mood/affect appear normal.  No cough or conversational dyspnea  LABORATORY DATA:  I have reviewed the data as listed CBC Latest Ref Rng & Units 09/26/2020 10/23/2019 05/26/2019  WBC 4.0 - 10.5 K/uL 7.0 6.3 5.2  Hemoglobin 12.0 - 15.0 g/dL 13.5 13.3 13.9  Hematocrit 36.0 - 46.0 % 39.0 38.6 41.7  Platelets 150 - 400 K/uL 184 192 191     CMP Latest Ref Rng & Units 09/26/2020 10/23/2019 05/26/2019  Glucose 70 - 99 mg/dL 239(H) 152(H) 243(H)  BUN 8 - 23 mg/dL '11 12 12  ' Creatinine 0.44 - 1.00 mg/dL 0.78 0.76 0.68  Sodium 135 - 145 mmol/L 138 140 138  Potassium 3.5 - 5.1 mmol/L 4.1 4.1 4.3  Chloride 98 - 111 mmol/L  103 104 103  CO2 22 - 32 mmol/L '27 28 24  ' Calcium 8.9 - 10.3 mg/dL 9.1 8.9 9.2  Total Protein 6.5 - 8.1 g/dL 6.7 6.7 -  Total Bilirubin 0.3 - 1.2 mg/dL 0.4 0.5 -  Alkaline Phos 38 - 126 U/L 58 57 -  AST 15 - 41 U/L 17 14(L) -  ALT 0 - 44 U/L 25 17 -      RADIOGRAPHIC STUDIES: I have personally reviewed the radiological images as listed and agreed with the findings in the report. MR ABDOMEN MRCP W WO CONTAST  Result Date: 10/23/2020 CLINICAL DATA:  Follow-up pancreatic cyst/pseudocyst EXAM: MRI ABDOMEN WITHOUT AND WITH CONTRAST (INCLUDING MRCP) TECHNIQUE: Multiplanar multisequence MR imaging of the abdomen was performed both before and after the administration of intravenous contrast. Heavily T2-weighted images of the biliary and pancreatic ducts were obtained, and three-dimensional MRCP images were rendered by post processing. CONTRAST:  38m MULTIHANCE GADOBENATE DIMEGLUMINE 529 MG/ML IV SOLN COMPARISON:  MRI abdomen dated 10/26/2019 and 09/19/2017. CT abdomen dated 09/25/2016. FINDINGS: Lower chest: Lung bases are clear Hepatobiliary: Liver is within normal limits. No suspicious/enhancing hepatic lesions.  No hepatic steatosis. Gallbladder is unremarkable. No intrahepatic or extrahepatic ductal dilatation. Pancreas: Scattered dominant unilocular cystic lesion the pancreatic tail measuring 7 mm (series 12/image 20), favoring a small pseudocyst versus IPMN, grossly unchanged from 2018. Additional tiny pancreatic cystic lesions likely reflect side branch ectasia versus side branch IPMNs (for example, series 12/images 22, 24, 27, 30, 32, and 33), measuring 2-5 mm. No main pancreatic ductal dilatation. No enhancing pancreatic mass or atrophy. Spleen:  Within normal limits. Adrenals/Urinary Tract:  Adrenal glands are within normal limits. Right renal cortical scarring. Multiple right renal cysts, measuring up to 15 mm in the medial right upper kidney (series 15/image 17), benign (Bosniak I). Left kidney is within normal limits. No hydronephrosis. Stomach/Bowel: Stomach is notable for a tiny hiatal hernia. Visualized bowel is unremarkable. Vascular/Lymphatic:  No evidence of abdominal aortic aneurysm. No suspicious abdominal lymphadenopathy. Other:  No abdominal ascites. Musculoskeletal: Mild degenerative changes of the upper lumbar spine. IMPRESSION: Scattered tiny pancreatic cysts measuring up to 7 mm, benign. Dominant lesion is grossly unchanged dating back to 2018. Given the small size, it is unclear that additional follow-up is required. However, if desired, follow-up MR abdomen with/without contrast could be considered in 2 years. Electronically Signed   By: SJulian HyM.D.   On: 10/23/2020 22:19     ASSESSMENT & PLAN: RYAIRE KREHERis a 75y.o. female with    1. Strong family history of pancreatic cancer, negative genetic testing -She has a very strong family history of malignancy, especially pancreatic cancer.  Her brother died of pancreatic cancer at age of 466 brother son had neuroendocrine tumor, 2 first cousins had pancreatic cancer in their 653s and 2 of her maternal cousins had breast cancer  in their 674s  She also has family history of breast cancer, uterine cancer, and brain tumor.  -Her extensive genetic testing, including BRCA1, BRCA2, and Lynch syndrome, was negative -She is certainly at high risk for pancreatic cancer, due to her strong family history.  Although she does not meet the criteria for familial pancreatic cancer -According to NCCN guideline, she is considered high risk individual and qualify for pancreatic cancer screening, with MRI/MRCP or EUS annually.   -Her screening colonoscopy and endoscopy with Dr. JArdis Hughsin 10/2018 was normal except stable pancreatic cyst  -MRI abdomen from 10/26/19 shows 10cm cystic of  her pancreatic tail, most likely benign. No other concerning features. CA 19.9 has remained normal. Overall no indication for pancreatic cancer.  -MR/MRCP from 10/23/2020 and discussed with the patient, which shows a dominant cystic lesion in the tail versus IPMN and additional tiny pancreatic cystic lesions likely reflecting sidebranch ectasia versus sidebranch IPMN's; overall stable since 2018.   -We will discuss her case in GI conference next week to see if she needs to proceed with EUS for biopsy, follow-up pending recommendations  2. Diabetic polyneuropathy associated with type 2 diabetes mellitus -On 100 mg gabapentin.  -Hemoglobin A1c up to 6.9, and neuropathy is progressing.  She is agreeable to begin Metformin per PCP  3.  OSA -A CPAP has been recommended but is backordered, unclear timeframe for her to start -now that she knows she has OSA, she is worried about apneic episodes  -I gave her the name and number of dental sleep specialist Dr. Oneal Grout who can review her sleep study and may be able to make her an oral appliance if appropriate while she waits for CPAP   Disposition: Ms. Pierron is clinically doing well, without new pain, signs of jaundice, unintentional weight loss, or unexplainable fatigue.  Recent labs showed normal CA 19-9, CBC, and  CMP except BG 239.  I have personally reviewed her MR/MRCP from 10/23/2020 and discussed with the patient, which shows a dominant cystic lesion in the pancreatic tail versus IPMN and additional tiny pancreatic cystic lesions likely reflecting sidebranch ectasia versus sidebranch IPMN's; overall stable since 2018. The stability and overall clinical picture are reassuring.  The question is whether she should undergo EUS with FNA to confirm IPMN and rule out high risk features now or just observe.  Also discussed the possibility of short-term follow-up.  Will discuss her case with Dr. Ardis Hughs in GI conference on 11/02/2020 and call patient with recommendations.  Follow-up pending discussion.  All questions were answered. The patient knows to call the clinic with any problems, questions or concerns. No barriers to learning were detected. I spent 25 minutes on today's call.      Alla Feeling, NP 10/24/20

## 2020-10-25 ENCOUNTER — Encounter: Payer: Self-pay | Admitting: Nurse Practitioner

## 2020-10-25 ENCOUNTER — Inpatient Hospital Stay: Payer: Medicare PPO | Attending: Hematology | Admitting: Nurse Practitioner

## 2020-10-25 ENCOUNTER — Telehealth: Payer: Self-pay | Admitting: Nurse Practitioner

## 2020-10-25 DIAGNOSIS — K862 Cyst of pancreas: Secondary | ICD-10-CM

## 2020-10-25 DIAGNOSIS — Z800 Family history of malignant neoplasm of digestive organs: Secondary | ICD-10-CM | POA: Diagnosis not present

## 2020-10-25 NOTE — Telephone Encounter (Signed)
Checked out appointment. No LOS notes needing to be scheduled. No changes made. 

## 2020-10-27 ENCOUNTER — Ambulatory Visit: Payer: Medicare PPO | Admitting: Podiatry

## 2020-10-27 ENCOUNTER — Other Ambulatory Visit: Payer: Self-pay

## 2020-10-27 ENCOUNTER — Encounter: Payer: Self-pay | Admitting: Podiatry

## 2020-10-27 DIAGNOSIS — G4733 Obstructive sleep apnea (adult) (pediatric): Secondary | ICD-10-CM | POA: Insufficient documentation

## 2020-10-27 DIAGNOSIS — Z86008 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: Secondary | ICD-10-CM | POA: Insufficient documentation

## 2020-10-27 DIAGNOSIS — E559 Vitamin D deficiency, unspecified: Secondary | ICD-10-CM | POA: Insufficient documentation

## 2020-10-27 DIAGNOSIS — R400 Somnolence: Secondary | ICD-10-CM | POA: Insufficient documentation

## 2020-10-27 DIAGNOSIS — E1142 Type 2 diabetes mellitus with diabetic polyneuropathy: Secondary | ICD-10-CM | POA: Diagnosis not present

## 2020-10-27 DIAGNOSIS — G629 Polyneuropathy, unspecified: Secondary | ICD-10-CM | POA: Insufficient documentation

## 2020-10-27 DIAGNOSIS — R0989 Other specified symptoms and signs involving the circulatory and respiratory systems: Secondary | ICD-10-CM | POA: Diagnosis not present

## 2020-10-27 DIAGNOSIS — M2042 Other hammer toe(s) (acquired), left foot: Secondary | ICD-10-CM

## 2020-10-27 DIAGNOSIS — G4710 Hypersomnia, unspecified: Secondary | ICD-10-CM | POA: Insufficient documentation

## 2020-10-27 DIAGNOSIS — I10 Essential (primary) hypertension: Secondary | ICD-10-CM | POA: Insufficient documentation

## 2020-10-27 DIAGNOSIS — M2041 Other hammer toe(s) (acquired), right foot: Secondary | ICD-10-CM

## 2020-10-27 DIAGNOSIS — L719 Rosacea, unspecified: Secondary | ICD-10-CM | POA: Insufficient documentation

## 2020-10-27 DIAGNOSIS — F419 Anxiety disorder, unspecified: Secondary | ICD-10-CM | POA: Insufficient documentation

## 2020-10-27 NOTE — Progress Notes (Signed)
She presents today for follow-up of her neuropathy.  She states that the neuropathy is actually getting worse her hemoglobin A1c is at 6.9.  Objective: Vital signs are stable alert oriented x3 pulses are diminished bilaterally.  Capillary fill time is sluggish.  Feet are cool to the touch.  No open lesions or wounds are noted.  Epicritic sensation is diminished per Thornell Mule monofilament.  Assessment probable peripheral vascular disease with significant diabetic peripheral neuropathy.  Plan: At this point I recommended she continue her current therapies and we are going to request ankle-brachial indices should be these come back abnormal we will notify her immediately.

## 2020-10-31 NOTE — Telephone Encounter (Signed)
The pt has been scheduled for 11/11/20 to discuss timing of the procedures.

## 2020-10-31 NOTE — Telephone Encounter (Signed)
She does not need EUS so soon after the 3/22 MR.  Please offer her my first available OV to discuss timing of any further pancreatic cancer screening tests.  Not with extender please.

## 2020-11-02 ENCOUNTER — Ambulatory Visit (HOSPITAL_COMMUNITY)
Admission: RE | Admit: 2020-11-02 | Discharge: 2020-11-02 | Disposition: A | Discharge status: Home / Self Care | Payer: Medicare PPO | Source: Ambulatory Visit | Attending: Podiatry | Admitting: Podiatry

## 2020-11-02 ENCOUNTER — Other Ambulatory Visit: Payer: Self-pay

## 2020-11-02 ENCOUNTER — Encounter: Payer: Self-pay | Admitting: Nurse Practitioner

## 2020-11-02 DIAGNOSIS — R0989 Other specified symptoms and signs involving the circulatory and respiratory systems: Secondary | ICD-10-CM | POA: Insufficient documentation

## 2020-11-02 DIAGNOSIS — E1142 Type 2 diabetes mellitus with diabetic polyneuropathy: Secondary | ICD-10-CM | POA: Insufficient documentation

## 2020-11-11 ENCOUNTER — Encounter: Payer: Self-pay | Admitting: Gastroenterology

## 2020-11-11 ENCOUNTER — Telehealth: Payer: Self-pay | Admitting: Genetic Counselor

## 2020-11-11 ENCOUNTER — Ambulatory Visit (INDEPENDENT_AMBULATORY_CARE_PROVIDER_SITE_OTHER): Payer: Medicare PPO | Admitting: Gastroenterology

## 2020-11-11 VITALS — BP 134/70 | HR 68 | Ht 65.25 in | Wt 155.1 lb

## 2020-11-11 DIAGNOSIS — K862 Cyst of pancreas: Secondary | ICD-10-CM

## 2020-11-11 NOTE — Patient Instructions (Addendum)
If you are age 73 or older, your body mass index should be between 23-30. Your Body mass index is 25.62 kg/m. If this is out of the aforementioned range listed, please consider follow up with your Primary Care Provider.  Please purchase the following medications over the counter and take as directed:  START: Gas X one tablet daily.  Please avoid smoking cigarettes or drinking alcohol.  You will be put in for a recall EUS in September 2022.  Someone will call you to get this scheduled.  Referal to genetics was made today.  Someone should contact you in 1 to 2 weeks with an appointment.  Please call our office at [phone] if you do not hear from their office with an appointment.  Thank you for entrusting me with your care and choosing [facility].  Dr Ardis Hughs

## 2020-11-11 NOTE — Telephone Encounter (Signed)
Scheduled appt per 4/1 sch msg. Pt aware.  

## 2020-11-11 NOTE — Progress Notes (Signed)
Review of pertinent gastrointestinal problems: 1.  Incidental pancreatic cyst, also brother had pancreatic cancer.  MRI 2019 "pancreatic tail cystic lesion measures 1.1 x 0.5 x 0.8 cm" previous bili this measures 0.9 x 0.5 x 0.8 cm and 2018.  Endoscopic ultrasound March 2020 pancreatic tail cystic lesion that was not an obvious communication with the main pancreatic duct, cyst measured 10 mm.  No associated mass.  MRI March 2021 "stable 10 mm cystic lesion in pancreatic tail".  MRI March 2022 "scattered tiny pancreatic cysts measuring up to 7 mm, benign.  Dominant lesion is grossly unchanged dating back to 2018.  Given the small size it is unclear that additional follow-up is required.  However if desired follow-up MRI could be considered in 2 years"  Genetic counseling OV March 2018, negative genetic testing labs.  Statement in their note which read "while there is no recommended pancreatic screening at this time, she could discuss options further with her providers" 2.  Routine risk for colon cancer, colonoscopy March 2020 showed left-sided diverticulosis, no polyps or cancers.  HPI: This is a very pleasant 75 year old woman whom I last saw 2 years ago.  She is here today to discuss gassiness, family history of pancreatic cancer, personal history of incidental pancreatic cyst,  She has gassiness especially after meals.  She eats a diet that is high in fruits and vegetables.  She moves her bowels once and sometimes twice a day without significant constipation really ever.  No overt GI bleeding  Her weight is overall stable   ROS: complete GI ROS as described in HPI, all other review negative.  Constitutional:  No unintentional weight loss   Past Medical History:  Diagnosis Date  . Anxiety   . Chest pressure   . Degenerative arthritis   . Deviated septum   . Diabetes mellitus without complication (HCC)    diet controlled  . Diabetic neuropathy ([place]) 12/03/2017  . DJD (degenerative  joint disease) of knee   . Graves disease    RADIOACTIVE IODINE 1983  . H/O hiatal hernia   . Heart palpitations   . History of diastolic dysfunction    per echo in 2011  . Hyperlipidemia   . Hypertension   . Hypothyroidism   . LVH (left ventricular hypertrophy)    per echo in 2011  . Mitral valve prolapse   . Neuropathy, peripheral    DR. Jannifer Franklin 12/15  . Normal nuclear stress test 2011  . OSA (obstructive sleep apnea)   . Polyneuropathy in other diseases classified elsewhere ([place]) 05/21/2013  . Rosacea 2019   MELANOMA CHEST REMOVED IN SITU  . Sleep difficulties   . Tinnitus     Past Surgical History:  Procedure Laterality Date  . ABDOMINAL HYSTERECTOMY  2000   TOTAL  . APPENDECTOMY  1989  . CARDIOVASCULAR STRESS TEST  11/09/2009   EF 75%  . CARPAL TUNNEL RELEASE  2002   bilateral  . COLONOSCOPY WITH PROPOFOL N/A 10/23/2018   Procedure: COLONOSCOPY WITH PROPOFOL;  Surgeon: [name], MD;  Location: [facility];  Service: Endoscopy;  Laterality: N/A;  . ESOPHAGOGASTRODUODENOSCOPY (EGD) WITH PROPOFOL N/A 10/23/2018   Procedure: ESOPHAGOGASTRODUODENOSCOPY (EGD) WITH PROPOFOL;  Surgeon: [name], MD;  [facility];  Service: Endoscopy;  Laterality: N/A;  . EUS N/A 10/23/2018   Procedure: UPPER ENDOSCOPIC ULTRASOUND (EUS) RADIAL;  Surgeon: [name], MD;  [facility];  Service: Endoscopy;  Laterality: N/A;  . MASS EXCISION Left 06/02/2019  Procedure: EXCISION OF SUBCUTANEOUS ABDOMINAL WALL MASSES LEFT LOWER QUADRANT;  Surgeon: [name], MD;  Location: [place];  Service: General;  Laterality: Left;  . TONSILLECTOMY  1958  . TRANSTHORACIC ECHOCARDIOGRAM  11/01/2009   NORMAL LV FILLING AND DIASTOLIC DYSFUNCTION AND MILD AORTIC SCLEROSIS AND TRACE MITRAL REGURGITATION    Current Outpatient Medications  Medication Sig Dispense Refill  . acetaminophen (TYLENOL) 325 MG tablet Take 650 mg by mouth every 6 (six) hours as needed.    .  ALPRAZolam (XANAX) 0.25 MG tablet Take 0.25 mg by mouth daily as needed for anxiety.     . Cholecalciferol (VITAMIN D) 50 MCG (2000 UT) tablet Take 2,000 Units by mouth daily.     . fluticasone (FLONASE) 50 MCG/ACT nasal spray Place 2 sprays into both nostrils daily as needed for allergies or rhinitis.     . furosemide (LASIX) 20 MG tablet Take 10 mg by mouth daily.    Marland Kitchen gabapentin (NEURONTIN) 100 MG capsule Take 1 capsule (100 mg total) by mouth 3 (three) times daily. PRN 90 capsule 1  . gabapentin (NEURONTIN) 300 MG capsule Take 300 mg by mouth at bedtime.     Marland Kitchen levothyroxine (SYNTHROID, LEVOTHROID) 100 MCG tablet Take 100 mcg by mouth daily before breakfast.    . metFORMIN (GLUCOPHAGE) 500 MG tablet 1 tablet with a meal    . metoprolol succinate (TOPROL-XL) 25 MG 24 hr tablet TAKE 1/2 TABLET BY MOUTH DAILY. 45 tablet 3  . metroNIDAZOLE (METROCREAM) 0.75 % cream Apply 1 application topically daily.     . potassium chloride SA (K-DUR,KLOR-CON) 20 MEQ tablet TAKE 1/2 TABLET BY MOUTH DAILY. 45 tablet 3  . Prenatal Vit-Fe Fumarate-FA (PRENATAL MULTIVITAMIN) TABS tablet Take 1 tablet by mouth daily at 12 noon.    . rosuvastatin (CRESTOR) 5 MG tablet Take 1 tablet (5 mg total) by mouth daily. 90 tablet 3   No current facility-administered medications for this visit.    Allergies as of 11/11/2020 - Review Complete 11/11/2020  Allergen Reaction Noted  . Tobramycin Hives 01/31/2011  . Sulfa antibiotics  10/12/2020  . Elemental sulfur Nausea Only 06/20/2017  . Erythromycin Nausea Only 12/28/2011    Family History  Problem Relation Age of Onset  . Hypertension Mother   . Heart disease Mother   . Dementia Mother   . Heart disease Father   . Arthritis Father   . Diabetes Father   . Hypertension Father   . Cancer Brother 45       pancreatic  . Other Brother        automobile accident  . Stroke Maternal Grandmother   . Heart disease Maternal Grandfather   . Cancer Paternal Aunt         malignant brain cancer  . Cancer Other 49       pancreatic neuroendocrine carcinoma  . Cancer Cousin 64       pancreatic  . Cancer Cousin 60       pancreatic  . Cancer Cousin 52       breast  . Cancer Cousin 67       uterine  . Cancer Cousin 75       unknown primary  . Colon cancer Neg Hx     Social History   Socioeconomic History  . Marital status: Married    Spouse name: Edd Fabian  . Number of children: 1  . Years of education: COLLEGE  . Highest education level: Not on file  Occupational History  .  Occupation: Retired  Tobacco Use  . Smoking status: Former Smoker    Packs/day: 0.50    Years: 10.00    Pack years: 5.00    Quit date: 08/14/1975    Years since quitting: 45.2  . Smokeless tobacco: Never Used  Vaping Use  . Vaping Use: Never used  Substance and Sexual Activity  . Alcohol use: Yes    Comment: one glass of wine a week  . Drug use: No  . Sexual activity: Not on file  Other Topics Concern  . Not on file  Social History Narrative   Lives at home w/ her husband, Edd Fabian.   Right-handed.   Drinks no caffeine but does eat chocolate occasionally.   epworth sleepiness scale = 3 (01/10/16)   Social Determinants of Health   Financial Resource Strain: Not on file  Food Insecurity: Not on file  Transportation Needs: Not on file  Physical Activity: Not on file  Stress: Not on file  Social Connections: Not on file  Intimate Partner Violence: Not on file     Physical Exam: BP 134/70 (BP Location: Left Arm, Patient Position: Sitting, Cuff Size: Normal)   Pulse 68   Ht 5' 5.25" (1.657 m) Comment: height measured without shoes  Wt 155 lb 2 oz (70.4 kg)   BMI 25.62 kg/m  Constitutional: generally well-appearing Psychiatric: alert and oriented x3 Abdomen: soft, nontender, nondistended, no obvious ascites, no peritoneal signs, normal bowel sounds No peripheral edema noted in lower extremities  Assessment and plan: 75 y.o. female with family history of  pancreatic cancer in brother, 2 first cousins, incidental pancreatic cyst, gassiness  First she would like to be as absolutely proactive about pancreatic cancer screening as as possible and so we discussed and alternating endoscopic ultrasound, MRI protocol in which she would be receiving 1 or the other test every 6 months.  She will therefore be due for endoscopic ultrasound in September, about 6 months from now.  We will plan endoscopic ultrasounds every September and MRIs every March.  I recommended we get her back into genetics to see if any new mutations might be known that could contribute to her risk profile for pancreatic cancer.  I explained to her that she should avoid drinking and smoking cigarettes and try to maintain a healthy body weight is probably the best thing she could do to decrease or somehow mitigate her risk for pancreatic cancer  Her gassiness is unlikely anything serious.  She had a colonoscopy less than 2 years ago.  I recommended a Gas-X every day shortly after waking.  Please see the "Patient Instructions" section for addition details about the plan.  Owens Loffler, MD [facility] 11/11/2020, 9:06 AM   Total time on date of encounter was 30 minutes (this included time spent preparing to see the patient reviewing records; obtaining and/or reviewing separately obtained history; performing a medically appropriate exam and/or evaluation; counseling and educating the patient and family if present; ordering medications, tests or procedures if applicable; and documenting clinical information in the health record).

## 2020-11-15 ENCOUNTER — Other Ambulatory Visit: Payer: Self-pay

## 2020-11-15 ENCOUNTER — Inpatient Hospital Stay: Payer: Medicare PPO | Attending: Hematology | Admitting: Genetic Counselor

## 2020-11-15 ENCOUNTER — Inpatient Hospital Stay: Payer: Medicare PPO

## 2020-11-15 DIAGNOSIS — E039 Hypothyroidism, unspecified: Secondary | ICD-10-CM | POA: Diagnosis not present

## 2020-11-15 DIAGNOSIS — R978 Other abnormal tumor markers: Secondary | ICD-10-CM | POA: Insufficient documentation

## 2020-11-15 DIAGNOSIS — G629 Polyneuropathy, unspecified: Secondary | ICD-10-CM | POA: Insufficient documentation

## 2020-11-15 DIAGNOSIS — Z808 Family history of malignant neoplasm of other organs or systems: Secondary | ICD-10-CM

## 2020-11-15 DIAGNOSIS — K862 Cyst of pancreas: Secondary | ICD-10-CM

## 2020-11-15 DIAGNOSIS — Z85828 Personal history of other malignant neoplasm of skin: Secondary | ICD-10-CM | POA: Insufficient documentation

## 2020-11-15 DIAGNOSIS — Z8041 Family history of malignant neoplasm of ovary: Secondary | ICD-10-CM | POA: Diagnosis not present

## 2020-11-15 DIAGNOSIS — Z8042 Family history of malignant neoplasm of prostate: Secondary | ICD-10-CM

## 2020-11-15 DIAGNOSIS — Z87891 Personal history of nicotine dependence: Secondary | ICD-10-CM | POA: Diagnosis not present

## 2020-11-15 DIAGNOSIS — Z803 Family history of malignant neoplasm of breast: Secondary | ICD-10-CM | POA: Diagnosis not present

## 2020-11-15 DIAGNOSIS — Z800 Family history of malignant neoplasm of digestive organs: Secondary | ICD-10-CM | POA: Diagnosis not present

## 2020-11-15 DIAGNOSIS — Z801 Family history of malignant neoplasm of trachea, bronchus and lung: Secondary | ICD-10-CM | POA: Diagnosis not present

## 2020-11-15 DIAGNOSIS — Z8052 Family history of malignant neoplasm of bladder: Secondary | ICD-10-CM | POA: Diagnosis not present

## 2020-11-15 DIAGNOSIS — Z8049 Family history of malignant neoplasm of other genital organs: Secondary | ICD-10-CM

## 2020-11-15 DIAGNOSIS — Z85820 Personal history of malignant melanoma of skin: Secondary | ICD-10-CM | POA: Insufficient documentation

## 2020-11-15 LAB — CBC WITH DIFFERENTIAL (CANCER CENTER ONLY)
Abs Immature Granulocytes: 0.02 10*3/uL (ref 0.00–0.07)
Basophils Absolute: 0.1 10*3/uL (ref 0.0–0.1)
Basophils Relative: 1 %
Eosinophils Absolute: 0.3 10*3/uL (ref 0.0–0.5)
Eosinophils Relative: 3 %
HCT: 39.4 % (ref 36.0–46.0)
Hemoglobin: 13.7 g/dL (ref 12.0–15.0)
Immature Granulocytes: 0 %
Lymphocytes Relative: 22 %
Lymphs Abs: 1.8 10*3/uL (ref 0.7–4.0)
MCH: 28.6 pg (ref 26.0–34.0)
MCHC: 34.8 g/dL (ref 30.0–36.0)
MCV: 82.3 fL (ref 80.0–100.0)
Monocytes Absolute: 0.8 10*3/uL (ref 0.1–1.0)
Monocytes Relative: 9 %
Neutro Abs: 5.4 10*3/uL (ref 1.7–7.7)
Neutrophils Relative %: 65 %
Platelet Count: 208 10*3/uL (ref 150–400)
RBC: 4.79 MIL/uL (ref 3.87–5.11)
RDW: 13.2 % (ref 11.5–15.5)
WBC Count: 8.3 10*3/uL (ref 4.0–10.5)
nRBC: 0 % (ref 0.0–0.2)

## 2020-11-15 LAB — GENETIC SCREENING ORDER

## 2020-11-15 LAB — CMP (CANCER CENTER ONLY)
ALT: 21 U/L (ref 0–44)
AST: 15 U/L (ref 15–41)
Albumin: 4.4 g/dL (ref 3.5–5.0)
Alkaline Phosphatase: 56 U/L (ref 38–126)
Anion gap: 10 (ref 5–15)
BUN: 13 mg/dL (ref 8–23)
CO2: 26 mmol/L (ref 22–32)
Calcium: 9.3 mg/dL (ref 8.9–10.3)
Chloride: 103 mmol/L (ref 98–111)
Creatinine: 0.74 mg/dL (ref 0.44–1.00)
GFR, Estimated: >60 mL/min (ref >60)
Glucose, Bld: 153 mg/dL — ABNORMAL HIGH (ref 70–99)
Potassium: 4.2 mmol/L (ref 3.5–5.1)
Sodium: 139 mmol/L (ref 135–145)
Total Bilirubin: 0.4 mg/dL (ref 0.3–1.2)
Total Protein: 7.2 g/dL (ref 6.5–8.1)

## 2020-11-16 ENCOUNTER — Encounter: Payer: Self-pay | Admitting: Genetic Counselor

## 2020-11-16 DIAGNOSIS — Z8042 Family history of malignant neoplasm of prostate: Secondary | ICD-10-CM | POA: Insufficient documentation

## 2020-11-16 DIAGNOSIS — Z8041 Family history of malignant neoplasm of ovary: Secondary | ICD-10-CM | POA: Insufficient documentation

## 2020-11-16 DIAGNOSIS — Z8049 Family history of malignant neoplasm of other genital organs: Secondary | ICD-10-CM | POA: Insufficient documentation

## 2020-11-16 DIAGNOSIS — Z800 Family history of malignant neoplasm of digestive organs: Secondary | ICD-10-CM | POA: Insufficient documentation

## 2020-11-16 DIAGNOSIS — Z808 Family history of malignant neoplasm of other organs or systems: Secondary | ICD-10-CM | POA: Insufficient documentation

## 2020-11-16 DIAGNOSIS — Z803 Family history of malignant neoplasm of breast: Secondary | ICD-10-CM | POA: Insufficient documentation

## 2020-11-16 DIAGNOSIS — Z8052 Family history of malignant neoplasm of bladder: Secondary | ICD-10-CM | POA: Insufficient documentation

## 2020-11-16 LAB — CANCER ANTIGEN 19-9: CA 19-9: 6 U/mL (ref 0–35)

## 2020-11-16 NOTE — Progress Notes (Signed)
REFERRING PROVIDER: [name], MD [address],  [place] [address]  PRIMARY PROVIDER:  Kathyrn Lass, MD  PRIMARY REASON FOR VISIT:  1. Family history of pancreatic cancer   2. Family history of brain cancer   3. Family history of throat cancer   4. Family history of breast cancer   5. Family history of ovarian cancer   6. Family history of prostate cancer   7. Family history of stomach cancer   8. Family history of bladder cancer   9. Family history of uterine cancer       HISTORY OF PRESENT ILLNESS:   Yvonne [name], a 75 y.o. female, was seen for a [place] cancer genetics consultation at the request of Dr. Ardis Hughs due to a personal and family history of cancer.  Yvonne [name] presents to clinic today to discuss the possibility of a hereditary predisposition to cancer, genetic testing, and to further clarify her future cancer risks, as well as potential cancer risks for family members.   Yvonne [name] has a personal history of melanoma removed from her chest in December of 2018 at the age of 6.  In February of 2018, Yvonne [name] had genetic testing through the [facility]. These results were negative:    RISK FACTORS:  Menarche was at age 45.  First live birth at age 47.  OCP use for approximately 0 years.  Ovaries intact: no.  Hysterectomy: yes.  Menopausal status: postmenopausal.  HRT use: 9 years. Colonoscopy: yes; normal. Mammogram within the last year: yes. Number of breast biopsies: 1. Any excessive radiation exposure in the past: no.  Past Medical History:  Diagnosis Date  . Anxiety   . Chest pressure   . Degenerative arthritis   . Deviated septum   . Diabetes mellitus without complication (HCC)    diet controlled  . Diabetic neuropathy ([place]) 12/03/2017  . DJD (degenerative joint disease) of knee   . Family history of bladder cancer   . Family history of brain cancer   . Family history of breast cancer   . Family history of  ovarian cancer   . Family history of prostate cancer   . Family history of stomach cancer   . Family history of throat cancer   . Family history of uterine cancer   . Graves disease    RADIOACTIVE IODINE 1983  . H/O hiatal hernia   . Heart palpitations   . History of diastolic dysfunction    per echo in 2011  . Hyperlipidemia   . Hypertension   . Hypothyroidism   . LVH (left ventricular hypertrophy)    per echo in 2011  . Mitral valve prolapse   . Neuropathy, peripheral    DR. Jannifer Franklin 12/15  . Normal nuclear stress test 2011  . OSA (obstructive sleep apnea)   . Polyneuropathy in other diseases classified elsewhere ([place]) 05/21/2013  . Rosacea 2019   MELANOMA CHEST REMOVED IN SITU  . Sleep difficulties   . Tinnitus     Past Surgical History:  Procedure Laterality Date  . ABDOMINAL HYSTERECTOMY  2000   TOTAL  . APPENDECTOMY  1989  . CARDIOVASCULAR STRESS TEST  11/09/2009   EF 75%  . CARPAL TUNNEL RELEASE  2002   bilateral  . COLONOSCOPY WITH PROPOFOL N/A 10/23/2018   Procedure: COLONOSCOPY WITH PROPOFOL;  Surgeon: [name], MD;  [facility];  Service: Endoscopy;  Laterality: N/A;  . ESOPHAGOGASTRODUODENOSCOPY (EGD) WITH PROPOFOL N/A 10/23/2018  Procedure: ESOPHAGOGASTRODUODENOSCOPY (EGD) WITH PROPOFOL;  Surgeon: [name], MD;  [facility];  Service: Endoscopy;  Laterality: N/A;  . EUS N/A 10/23/2018   Procedure: UPPER ENDOSCOPIC ULTRASOUND (EUS) RADIAL;  Surgeon: [name], MD;  [facility];  Service: Endoscopy;  Laterality: N/A;  . MASS EXCISION Left 06/02/2019   Procedure: EXCISION OF SUBCUTANEOUS ABDOMINAL WALL MASSES LEFT LOWER QUADRANT;  Surgeon: [name], MD;  Location: [facility];  Service: General;  Laterality: Left;  . TONSILLECTOMY  1958  . TRANSTHORACIC ECHOCARDIOGRAM  11/01/2009   NORMAL LV FILLING AND DIASTOLIC DYSFUNCTION AND MILD AORTIC SCLEROSIS AND TRACE MITRAL REGURGITATION    Social History    Socioeconomic History  . Marital status: Married    Spouse name: Edd Fabian  . Number of children: 1  . Years of education: COLLEGE  . Highest education level: Not on file  Occupational History  . Occupation: Retired  Tobacco Use  . Smoking status: Former Smoker    Packs/day: 0.50    Years: 10.00    Pack years: 5.00    Quit date: 08/14/1975    Years since quitting: 45.2  . Smokeless tobacco: Never Used  Vaping Use  . Vaping Use: Never used  Substance and Sexual Activity  . Alcohol use: Yes    Comment: one glass of wine a week  . Drug use: No  . Sexual activity: Not on file  Other Topics Concern  . Not on file  Social History Narrative   Lives at home w/ her husband, Edd Fabian.   Right-handed.   Drinks no caffeine but does eat chocolate occasionally.   epworth sleepiness scale = 3 (01/10/16)   Social Determinants of Health   Financial Resource Strain: Not on file  Food Insecurity: Not on file  Transportation Needs: Not on file  Physical Activity: Not on file  Stress: Not on file  Social Connections: Not on file     FAMILY HISTORY:  We obtained a detailed, 4-generation family history.  Significant diagnoses are listed below: Family History  Problem Relation Age of Onset  . Hypertension Mother   . Heart disease Mother   . Dementia Mother   . Heart disease Father   . Arthritis Father   . Diabetes Father   . Hypertension Father   . Pancreatic cancer Brother 44       pancreatic  . Other Brother        automobile accident  . Stroke Maternal Grandmother   . Heart disease Maternal Grandfather   . Brain cancer Paternal Aunt 67       malignant brain cancer  . Cancer Other 49       pancreatic neuroendocrine carcinoma  . Pancreatic cancer Cousin 3       pancreatic (maternal first cousin)  . Pancreatic cancer Cousin 62       pancreatic, smoker (maternal first cousin)  . Breast cancer Cousin 72       breast (maternal first cousin)  . Ovarian cancer Cousin   . Uterine  cancer Cousin 10       uterine (paternal first cousin)  . Cancer Cousin 43       unknown primary (paternal first cousin)  . Bladder Cancer Cousin 76       (paternal first cousin)  . Throat cancer Maternal Uncle   . Lung cancer Cousin   . Throat cancer Cousin   . Prostate cancer Cousin        (maternal first cousin)  .  Stomach cancer Cousin 29       (maternal first cousin)  . Colon cancer Neg Hx    Yvonne [name] has one daughter (age 52). She had two brothers and one sister. One brother died at age 27 from pancreatic cancer (diagnosed at age 45) and had a remote history of smoking as a teenager. This uncle's son died from metastatic neuroendocrine tumor (unsure of primary, but did affect pancreas). This nephew also had a history of pancreatitis.   Yvonne [name] mother died at age 56 without cancer. There were three maternal aunts and five maternal uncles. There were also 14 half-aunts and half-uncles (she has limited information about these relatives). One uncle had throat cancer. Five maternal first cousins have had cancer - one died from pancreatic cancer at age 49, a second died from pancreatic cancer at age 66, a third had a history of breast cancer at age 4 and ovarian cancer, a fourth had stomach cancer diagnosed around age 82, and the fifth had lung, throat, and prostate cancer. Yvonne [name] maternal grandmother died in her 57s due to an illness (unknown if cancer). Her maternal grandfather died at age 54 due to an unknown cause.  Yvonne [name] father died at age 13 without cancer. There were three paternal aunts and four paternal uncles (one aunt and one uncle were paternal half-siblings to her father). One aunt was diagnosed with brain cancer at age 11. The half-uncle died from an unknown cancer older than 85. Three paternal first cousins have had cancer - one had uterine cancer at age 34, a second died form an unknown cancer at age 75, and the third was diagnosed with bladder cancer  at age 13. Ms. Perdomo paternal grandmother died in her 62s due to the 10/15/1916 flu, without cancer. Her paternal grandfather died in his 71s due to a car accident, without cancer.  Ms. Rizor is unaware of previous family history of genetic testing for hereditary cancer risks. Patient's maternal ancestors are of unknown, possibly [place] descent, and paternal ancestors are of [name] descent. There is no reported Ashkenazi Jewish ancestry. There is no known consanguinity.  GENETIC COUNSELING ASSESSMENT: Yvonne [name] is a 75 y.o. female with a family history of pancreatic, breast, prostate, ovarian, stomach, uterine, brain, and bladder cancers, which is somewhat suggestive of a hereditary cancer syndrome and predisposition to cancer. We, therefore, discussed and recommended the following at today's visit.   DISCUSSION: We discussed that approximately 5-10% of cancer is hereditary, with most cases of hereditary pancreatic cancer associated with the BRCA genes. There are other genes that can be associated with hereditary pancreatic cancer syndromes. These include the Lynch syndrome genes, CDKN2A, ATM, PALB2, the pancreatitis genes, etc. We discussed that testing is beneficial for several reasons, including knowing about other cancer risks, identifying potential screening and risk-reduction options that may be appropriate, and to understand if other family members could be at risk for cancer and allow them to undergo genetic testing.   Yvonne [name] had normal genetic testing in 2016-10-15; however, this panel was not inclusive for all known pancreatic cancer/pancreatitis genes (e.g. CPA1, CTRC, PRSS1, SPINK1). Additionally, we discussed the option to test cancer genes with RNA analysis. RNA testing has demonstrated the ability to detect hereditary pathogenic variants that may otherwise be missed in DNA-only testing. We discussed that the likelihood for this additional testing to detect a mutation that was previously  missed may be relatively low, but that identifying one of these mutations and, subsequently, a hereditary  cancer syndrome can be beneficial.  We reviewed the characteristics, features and inheritance patterns of hereditary cancer syndromes. We also discussed genetic testing, including the appropriate family members to test, the process of testing, insurance coverage and turn-around-time for results. We discussed the implications of a negative, positive and/or variant of uncertain significant result. We recommended Yvonne [name] pursue genetic testing for the [facility] [employer]   The [facility]RNA[facility]ight panel offered by [facility] includes sequencing and rearrangement analysis for the following 91 genes: AIP, ALK, APC, ATM, AXIN2, BAP1, BARD1, BLM, BMPR1A, BRCA1, BRCA2, BRIP1, CDC73, CDH1, CDK4, CDKN1B, CDKN2A, CHEK2, CTNNA1, DICER1, FANCC, FH, FLCN, GALNT12, KIF1B, LZTR1, MAX, MEN1, MET, MLH1, MRE11A, MSH2, MSH3, MSH6, MUTYH, NBN, NF1, NF2, NTHL1, PALB2, PHOX2B, PMS2, POT1, PRKAR1A, PTCH1, PTEN, RAD50, RAD51C, RAD51D, RB1, RECQL, RET, SDHA, SDHAF2, SDHB, SDHC, SDHD, SMAD4, SMARCA4, SMARCB1, SMARCE1, STK11, SUFU, TMEM127, Tp53, TSC1, TSC2, VHL and XRCC2 (sequencing and deletion/duplication); CASR, CFTR, CPA1, CTRC, EGFR, EGLN1, FAM175A, HOXB13, KIT, MITF, MLH3, PALLD, PDGFRA, POLD1, POLE, PRSS1, RINT1, RPS20, SPINK1 and TERT (sequencing only); EPCAM and GREM1 (deletion/duplication only). RNA data is routinely analyzed for use in variant interpretation for all genes.   Based on Yvonne [name]'s personal and family history of cancer, she meets medical criteria for genetic testing. Despite that she meets criteria, there may still be an out of pocket cost. We discussed that if her out of pocket cost for testing is over $100, the laboratory will reach out to let her know. If the out of pocket cost of testing is less than $100 she will be billed by the genetic testing laboratory.     PLAN: After considering the risks, benefits, and limitations, Yvonne [name] provided informed consent to pursue genetic testing and the blood sample was sent to [facility] for analysis of the [facility] panel. Results should be available within approximately two-three weeks' time, at which point they will be disclosed by telephone to Yvonne [name], as will any additional recommendations warranted by these results. Yvonne [name] will receive a summary of her genetic counseling visit and a copy of her results once available. This information will also be available in Epic.   Yvonne [name] questions were answered to her satisfaction today. Our contact information was provided should additional questions or concerns arise. Thank you for the referral and allowing [name] to share in the care of your patient.   Clint Guy, [place], [facility] Licensed, Certified Dispensing [email or web address] Phone: [phone]  The patient was seen for a total of 60 minutes in face-to-face genetic counseling.  This patient was discussed with Drs. Magrinat, Lindi Adie and/or Burr Medico who agrees with the above.    _______________________________________________________________________ For Office Staff:  Number of people involved in session: 1 Was an Intern/ student involved with case: no

## 2020-11-21 ENCOUNTER — Other Ambulatory Visit (HOSPITAL_COMMUNITY): Payer: Medicare PPO

## 2020-11-24 ENCOUNTER — Ambulatory Visit (HOSPITAL_COMMUNITY): Admit: 2020-11-24 | Payer: Medicare PPO | Admitting: Gastroenterology

## 2020-11-24 ENCOUNTER — Encounter (HOSPITAL_COMMUNITY): Payer: Self-pay

## 2020-11-24 SURGERY — UPPER ENDOSCOPIC ULTRASOUND (EUS) RADIAL
Anesthesia: Monitor Anesthesia Care

## 2020-12-12 ENCOUNTER — Encounter: Payer: Self-pay | Admitting: Genetic Counselor

## 2020-12-12 ENCOUNTER — Telehealth: Payer: Self-pay | Admitting: Genetic Counselor

## 2020-12-12 ENCOUNTER — Ambulatory Visit: Payer: Self-pay | Admitting: Genetic Counselor

## 2020-12-12 DIAGNOSIS — Z1379 Encounter for other screening for genetic and chromosomal anomalies: Secondary | ICD-10-CM

## 2020-12-12 NOTE — Telephone Encounter (Signed)
Revealed negative genetic testing. Discussed that we do not know why there is cancer in the family. There could be a genetic mutation in the family that Yvonne [name] did not inherit. There could also be a mutation in a different gene that we are not testing, or our current technology may not be able to detect certain mutations. It will therefore be important for her to stay in contact with genetics to keep up with whether additional testing may be appropriate in the future.

## 2020-12-12 NOTE — Progress Notes (Signed)
HPI:  [name] was previously seen in the [facility] due to a family history of cancer and concerns regarding a hereditary predisposition to cancer. Please refer to our prior cancer genetics clinic note for more information regarding our discussion, assessment and recommendations, at the time. [name] recent genetic test results were disclosed to her, as were recommendations warranted by these results. These results and recommendations are discussed in more detail below.  FAMILY HISTORY:  We obtained a detailed, 4-generation family history.  Significant diagnoses are listed below: Family History  Problem Relation Age of Onset  . Hypertension Mother   . Heart disease Mother   . Dementia Mother   . Heart disease Father   . Arthritis Father   . Diabetes Father   . Hypertension Father   . Pancreatic cancer Brother 48       pancreatic  . Other Brother        automobile accident  . Stroke Maternal Grandmother   . Heart disease Maternal Grandfather   . Brain cancer Paternal Aunt 17       malignant brain cancer  . Cancer Other 49       pancreatic neuroendocrine carcinoma  . Pancreatic cancer Cousin 23       pancreatic (maternal first cousin)  . Pancreatic cancer Cousin 62       pancreatic, smoker (maternal first cousin)  . Breast cancer Cousin 67       breast (maternal first cousin)  . Ovarian cancer Cousin   . Uterine cancer Cousin 62       uterine (paternal first cousin)  . Cancer Cousin 32       unknown primary (paternal first cousin)  . Bladder Cancer Cousin 76       (paternal first cousin)  . Throat cancer Maternal Uncle   . Lung cancer Cousin   . Throat cancer Cousin   . Prostate cancer Cousin        (maternal first cousin)  . Stomach cancer Cousin 60       (maternal first cousin)  . Colon cancer Neg Hx    [name] has one daughter (age 51). She had two brothers and one sister. One brother died at age 34 from pancreatic cancer  (diagnosed at age 51) and had a remote history of smoking as a teenager. This uncle's son died from metastatic neuroendocrine tumor (unsure of primary, but did affect pancreas). This nephew also had a history of pancreatitis.   [name] mother died at age 67 without cancer. There were three maternal aunts and five maternal uncles. There were also 14 half-aunts and half-uncles (she has limited information about these relatives). One uncle had throat cancer. Five maternal first cousins have had cancer - one died from pancreatic cancer at age 44, a second died from pancreatic cancer at age 46, a third had a history of breast cancer at age 22 and ovarian cancer, a fourth had stomach cancer diagnosed around age 29, and the fifth had lung, throat, and prostate cancer. [name] maternal grandmother died in her 33s due to an illness (unknown if cancer). Her maternal grandfather died at age 59 due to an unknown cause.  [name]'s father died at age 2 without cancer. There were three paternal aunts and four paternal uncles (one aunt and one uncle were paternal half-siblings to her father). One aunt was diagnosed with brain cancer at age 45. The half-uncle died from an unknown cancer older  than 60. Three paternal first cousins have had cancer - one had uterine cancer at age 48, a second died form an unknown cancer at age 72, and the third was diagnosed with bladder cancer at age 14. [name] paternal grandmother died in her 4s due to the 1916/11/10 flu, without cancer. Her paternal grandfather died in his 68s due to a car accident, without cancer.  [name] is unaware of previous family history of genetic testing for hereditary cancer risks. Patient's maternal ancestors are of unknown, possibly [place] descent, and paternal ancestors are of [name] descent. There is no reported Ashkenazi Jewish ancestry. There is no known consanguinity.  GENETIC TEST RESULTS: Genetic testing reported out on  12/11/2020 through the [facility] CustomNext-Cancer + RNAinsight panel. No pathogenic variants were detected.   The CustomNext-Cancer+RNAinsight panel offered by [facility] includes sequencing and rearrangement analysis for the following 91 genes: AIP, ALK, APC, ATM, AXIN2, BAP1, BARD1, BLM, BMPR1A, BRCA1, BRCA2, BRIP1, CDC73, CDH1, CDK4, CDKN1B, CDKN2A, CHEK2, CTNNA1, DICER1, FANCC, FH, FLCN, GALNT12, KIF1B, LZTR1, MAX, MEN1, MET, MLH1, MRE11A, MSH2, MSH3, MSH6, MUTYH, NBN, NF1, NF2, NTHL1, PALB2, PHOX2B, PMS2, POT1, PRKAR1A, PTCH1, PTEN, RAD50, RAD51C, RAD51D, RB1, RECQL, RET, SDHA, SDHAF2, SDHB, SDHC, SDHD, SMAD4, SMARCA4, SMARCB1, SMARCE1, STK11, SUFU, TMEM127, Tp53, TSC1, TSC2, VHL and XRCC2 (sequencing and deletion/duplication); CASR, CFTR, CPA1, CTRC, EGFR, EGLN1, FAM175A, HOXB13, KIT, MITF, MLH3, PALLD, PDGFRA, POLD1, POLE, PRSS1, RINT1, RPS20, SPINK1 and TERT (sequencing only); EPCAM and GREM1 (deletion/duplication only). RNA data is routinely analyzed for use in variant interpretation for all genes. The test report will be scanned into EPIC and located under the Molecular Pathology section of the Results Review tab.  A portion of the result report is included below for reference.     We discussed with [name] that because current genetic testing is not perfect, it is possible there may be a gene mutation in one of these genes that current testing cannot detect, but that chance is small.  We also discussed that there could be another gene that has not yet been discovered, or that we have not yet tested, that is responsible for the cancer diagnoses in the family. It is also possible there is a hereditary cause for the cancer in the family that [name] did not inherit and therefore was not identified in her testing.  Therefore, it is important to remain in touch with cancer genetics in the future so that we can continue to offer [name] the most up to date genetic testing.   ADDITIONAL  GENETIC TESTING: We discussed with [name] that her genetic testing was fairly extensive.  If there are genes identified to increase cancer risk that can be analyzed in the future, we would be happy to discuss and coordinate this testing at that time.    CANCER SCREENING RECOMMENDATIONS: [name] test result is considered negative (normal).  This means that we have not identified a hereditary cause for her personal and family history of cancer at this time. While reassuring, this does not definitively rule out a hereditary predisposition to cancer. It is still possible that there could be genetic mutations that are undetectable by current technology. There could be genetic mutations in genes that have not been tested or identified to increase cancer risk. Therefore, it is recommended she continue to follow the cancer management and screening guidelines provided by her GI and primary healthcare providers.   An individual's cancer risk and medical management are not determined by genetic  test results alone. Overall cancer risk assessment incorporates additional factors, including personal medical history, family history, and any available genetic information that may result in a personalized plan for cancer prevention and surveillance.  Pancreatic Cancer:  The International Cancer of the Pancreas Screening (CAPS) Consortium recommends that individuals with a family history of pancreatic cancer in at least one first-degree relative and one second-degree relative should consider annual pancreatic cancer screening. [name] has a family history of pancreatic cancer in one first-degree relative (her brother) but no second-degree relatives, although there are two third-degree relatives (maternal cousins) who have had pancreatic cancer. No additional recommendations are made regarding pancreatic cancer screening based on the family history and current guidelines. [name] should continue to follow  pancreatic screening and management as recommended by her gastroenterologist (Dr. Ardis [name]).  Breast Cancer:  Based on [name]'s personal and family history, as well as her genetic test results, a statistical model Midwife) was used to estimate her risk of developing breast cancer. Tyrer-Cuzick estimates her lifetime risk of developing breast cancer to be approximately 5.6%. This lifetime breast cancer risk is a preliminary estimate based on available information using one of several models endorsed by the [place] (ACS). The ACS recommends consideration of breast MRI screening as an adjunct to mammography for patients at high risk (defined as 20% or greater lifetime risk). A more detailed breast cancer risk assessment can be considered, if clinically indicated. This risk estimate can change over time and this calculation may be repeated to reflect new information in her personal or family history in the future.    RECOMMENDATIONS FOR FAMILY MEMBERS:  Individuals in this family might be at some increased risk of developing cancer, over the general population risk, simply due to the family history of cancer.  We recommended women in this family have a yearly mammogram beginning at age 64, or 80 years younger than the earliest onset of cancer, an annual clinical breast exam, and perform monthly breast self-exams. Women in this family should also have a gynecological exam as recommended by their primary provider. All family members should be referred for colonoscopy starting at age 51.  FOLLOW-UP: Lastly, we discussed with [name] that cancer genetics is a rapidly advancing field and it is possible that new genetic tests will be appropriate for her and/or her family members in the future. We encouraged her to remain in contact with cancer genetics on an annual basis so we can update her personal and family histories and let her know of advances in cancer genetics that may benefit  this family.   Our contact number was provided. [name] questions were answered to her satisfaction, and she knows she is welcome to call us at anytime with additional questions or concerns.   Clint Guy, MS, [facility] Genetic Counselor [email or web address] Phone: [phone]

## 2021-02-02 ENCOUNTER — Ambulatory Visit: Payer: Medicare PPO | Admitting: Neurology

## 2021-02-10 DIAGNOSIS — E039 Hypothyroidism, unspecified: Secondary | ICD-10-CM | POA: Diagnosis not present

## 2021-02-10 DIAGNOSIS — E119 Type 2 diabetes mellitus without complications: Secondary | ICD-10-CM | POA: Diagnosis not present

## 2021-02-10 DIAGNOSIS — E559 Vitamin D deficiency, unspecified: Secondary | ICD-10-CM | POA: Diagnosis not present

## 2021-02-10 DIAGNOSIS — R6889 Other general symptoms and signs: Secondary | ICD-10-CM | POA: Diagnosis not present

## 2021-02-10 DIAGNOSIS — Z79899 Other long term (current) drug therapy: Secondary | ICD-10-CM | POA: Diagnosis not present

## 2021-02-10 DIAGNOSIS — Z7984 Long term (current) use of oral hypoglycemic drugs: Secondary | ICD-10-CM | POA: Diagnosis not present

## 2021-02-10 DIAGNOSIS — E785 Hyperlipidemia, unspecified: Secondary | ICD-10-CM | POA: Diagnosis not present

## 2021-02-10 DIAGNOSIS — R059 Cough, unspecified: Secondary | ICD-10-CM | POA: Diagnosis not present

## 2021-02-10 DIAGNOSIS — R5383 Other fatigue: Secondary | ICD-10-CM | POA: Diagnosis not present

## 2021-02-21 DIAGNOSIS — J029 Acute pharyngitis, unspecified: Secondary | ICD-10-CM | POA: Diagnosis not present

## 2021-02-21 DIAGNOSIS — Z20822 Contact with and (suspected) exposure to covid-19: Secondary | ICD-10-CM | POA: Diagnosis not present

## 2021-03-01 DIAGNOSIS — Z1389 Encounter for screening for other disorder: Secondary | ICD-10-CM | POA: Diagnosis not present

## 2021-03-01 DIAGNOSIS — I10 Essential (primary) hypertension: Secondary | ICD-10-CM | POA: Diagnosis not present

## 2021-03-01 DIAGNOSIS — Z6824 Body mass index (BMI) 24.0-24.9, adult: Secondary | ICD-10-CM | POA: Diagnosis not present

## 2021-03-01 DIAGNOSIS — E039 Hypothyroidism, unspecified: Secondary | ICD-10-CM | POA: Diagnosis not present

## 2021-03-01 DIAGNOSIS — Z780 Asymptomatic menopausal state: Secondary | ICD-10-CM | POA: Diagnosis not present

## 2021-03-01 DIAGNOSIS — Z0000 Encounter for general adult medical examination without abnormal findings: Secondary | ICD-10-CM | POA: Diagnosis not present

## 2021-03-01 DIAGNOSIS — E785 Hyperlipidemia, unspecified: Secondary | ICD-10-CM | POA: Diagnosis not present

## 2021-03-01 DIAGNOSIS — G629 Polyneuropathy, unspecified: Secondary | ICD-10-CM | POA: Diagnosis not present

## 2021-03-01 DIAGNOSIS — E1140 Type 2 diabetes mellitus with diabetic neuropathy, unspecified: Secondary | ICD-10-CM | POA: Diagnosis not present

## 2021-03-07 DIAGNOSIS — Z20822 Contact with and (suspected) exposure to covid-19: Secondary | ICD-10-CM | POA: Diagnosis not present

## 2021-03-13 DIAGNOSIS — Z85820 Personal history of malignant melanoma of skin: Secondary | ICD-10-CM | POA: Diagnosis not present

## 2021-03-13 DIAGNOSIS — D2272 Melanocytic nevi of left lower limb, including hip: Secondary | ICD-10-CM | POA: Diagnosis not present

## 2021-03-13 DIAGNOSIS — D2271 Melanocytic nevi of right lower limb, including hip: Secondary | ICD-10-CM | POA: Diagnosis not present

## 2021-03-13 DIAGNOSIS — L814 Other melanin hyperpigmentation: Secondary | ICD-10-CM | POA: Diagnosis not present

## 2021-03-13 DIAGNOSIS — L820 Inflamed seborrheic keratosis: Secondary | ICD-10-CM | POA: Diagnosis not present

## 2021-03-13 DIAGNOSIS — L718 Other rosacea: Secondary | ICD-10-CM | POA: Diagnosis not present

## 2021-03-13 DIAGNOSIS — L821 Other seborrheic keratosis: Secondary | ICD-10-CM | POA: Diagnosis not present

## 2021-03-13 DIAGNOSIS — D1801 Hemangioma of skin and subcutaneous tissue: Secondary | ICD-10-CM | POA: Diagnosis not present

## 2021-03-13 DIAGNOSIS — L720 Epidermal cyst: Secondary | ICD-10-CM | POA: Diagnosis not present

## 2021-03-15 ENCOUNTER — Telehealth: Payer: Self-pay

## 2021-03-15 NOTE — Telephone Encounter (Signed)
Per pt recall she is due for EUS with Dr Ardis Hughs in September.

## 2021-03-17 ENCOUNTER — Other Ambulatory Visit: Payer: Self-pay

## 2021-03-17 DIAGNOSIS — K862 Cyst of pancreas: Secondary | ICD-10-CM

## 2021-03-17 NOTE — Telephone Encounter (Signed)
The pt has been scheduled for EUS on 04/06/21 at 1030 am with Dr Ardis Hughs.   Left message on machine to call back

## 2021-03-20 NOTE — Telephone Encounter (Signed)
EUS scheduled, pt instructed and medications reviewed.  Patient instructions mailed to home.  Patient to call with any questions or concerns.  

## 2021-03-22 NOTE — Telephone Encounter (Signed)
I spoke with the pt and made her aware of Dr Ardis Hughs recommendations.  She agrees but would like to keep the EUS as planned for now. She wants to call Dr Burr Medico and make sure that the every other year schedule is ok.  She will call back and let me know how she wishes to proceed.

## 2021-03-22 NOTE — Telephone Encounter (Signed)
Call received from the pt wanting to confirm with Dr Ardis Hughs that she needs to keep the appt for EUS this year since she had recent (March 22) MRI with Dr Burr Medico.  She was under the impression that she could have EUS every other year and MRI on the opposite years.  Please advise

## 2021-03-22 NOTE — Telephone Encounter (Signed)
At our office visit 4 months ago, she had an eye discussed endoscopic ultrasound every September and MRI every March.   I see that she has had repeat genetic testing since then and she has no genetic mutation known to increase her chances of pancreatic cancer.   I truly do not feel very strongly about pancreatic cancer screening in her situation especially since data does not support it.   If she prefers to have endoscopic ultrasound every other year and MRI every other than that is okay with me.  Go ahead and reschedule for recall endoscopic ultrasound March 2023.

## 2021-03-28 ENCOUNTER — Telehealth: Payer: Self-pay

## 2021-03-28 ENCOUNTER — Ambulatory Visit: Payer: Medicare PPO | Admitting: Neurology

## 2021-03-29 ENCOUNTER — Telehealth: Payer: Self-pay

## 2021-03-29 NOTE — Telephone Encounter (Signed)
Recall entered and appt for EUS cancelled.  The pt has been advised.

## 2021-03-29 NOTE — Telephone Encounter (Signed)
This nurse called and made patient aware per Dr. Burr Medico that she has spoken with Dr. Ardis Hughs and they are both in agreement with  postponing her [name] Endoscopy until next year.  Patient is in agreement and acknowledges understanding. No further questions or concerns at this time.

## 2021-03-29 NOTE — Telephone Encounter (Signed)
-----   Message from [name], MD sent at 03/28/2021  4:22 PM EDT ----- I agree on lack of consensus.  Also I am not sure that data support pancreatic cancer screening in her situation at all.  This is cut and pasted from her recent genetic counselor visit:  Pancreatic Cancer:  The International Cancer of the Pancreas Screening (CAPS) Consortium recommends that individuals with a family history of pancreatic cancer in at least one first-degree relative and one second-degree relative should consider annual pancreatic cancer screening. Ms. Jarrard has a family history of pancreatic cancer in one first-degree relative (her brother) but no second-degree relatives, although there are two third-degree relatives (maternal cousins) who have had pancreatic cancer. No additional recommendations are made regarding pancreatic cancer screening based on the family history and current guidelines. Ms. Romain should continue to follow pancreatic screening and management as recommended by her gastroenterologist (Dr. Ardis Hughs).  Berlie Persky, Per my previous note she does not need endoscopic ultrasound this year but instead March 2023 recall endoscopic ultrasound for pancreatic cancer screening.  Thanks  ----- Message ----- From: Truitt Merle, MD Sent: 03/28/2021   3:45 PM EDT To: [name], MD, Estella Husk, LPN  Linna Hoff,   We do not have consensus on how often we do pancreatic screening by MRI or EUS, I think NCCN recommends once a year with either one, but more frequent screening is fine if there is any clinical or image concerns on previous study. Her last MRI did show a small cyst in pancrease, but it's been stable, I am not sure if you need additional screening for this. So I am OK to postpone her EUS to next year (one year after her last MRI) if you have no other Conners, but certainly OK if you recommend to proceed now.   Thanks  Krista Blue  ----- Message ----- From: Estella Husk, LPN Sent: [id number]   9:58 AM  EDT To: Truitt Merle, MD  Dr. Burr Medico,   This patient called in and stated that Dr. Ardis Hughs has suggested an Endoscopy based on the results from her MRI from March and the results for her genetic testing.  Patient would like to know if you feel that it is necessary for her to do an Endoscopy now or should she wait until February or March.  Patient states that Dr. Ardis Hughs is in agreement with your decision.    I sent this message priority because the patient stated that Dr. Ardis Hughs office has her scheduled for the Endoscopy next week but it can be cancelled if you do not feel that it is necessary at this time.   Thank you    Joy P. LPN

## 2021-03-31 NOTE — Telephone Encounter (Signed)
This nurse called and made patient aware per Dr. Burr Medico that she has spoken with Dr. Ardis Hughs and they are both in agreement with  postponing her [name] Endoscopy until next year.  Patient is in agreement and acknowledges understanding. No further questions or concerns at this time.

## 2021-03-31 NOTE — Telephone Encounter (Signed)
Accessed for documentation

## 2021-04-04 ENCOUNTER — Other Ambulatory Visit: Payer: Self-pay

## 2021-04-04 ENCOUNTER — Encounter: Payer: Self-pay | Admitting: Neurology

## 2021-04-04 ENCOUNTER — Ambulatory Visit: Payer: Medicare PPO | Admitting: Neurology

## 2021-04-04 VITALS — BP 130/71 | HR 74 | Ht 66.0 in | Wt 152.0 lb

## 2021-04-04 DIAGNOSIS — G63 Polyneuropathy in diseases classified elsewhere: Secondary | ICD-10-CM

## 2021-04-04 DIAGNOSIS — E1142 Type 2 diabetes mellitus with diabetic polyneuropathy: Secondary | ICD-10-CM | POA: Diagnosis not present

## 2021-04-04 NOTE — Progress Notes (Signed)
Reason for visit: Diabetic peripheral neuropathy  Yvonne [name] is an 75 y.o. female  History of present illness:  Yvonne [name] is a 75 year old right-handed white female with a history of diabetes with associated diabetic peripheral neuropathy.  The patient recently has been diagnosed with sleep apnea, she has not yet started CPAP.  She wakes up 4 or 5 times a night because of this.  She reports ongoing problems of having her feet feel cold.  She generally will take about 400 mg of gabapentin in the evening.  This does help some but does not alleviate all of her issues.  She will use a warming blanket for her legs at night that helps.  She denies any significant balance issues, she has not had any falls.  She does have some low back pain at times, she has arthritis in the knees bilaterally.  She is now on metformin, her most recent hemoglobin A1c was about 6.8.  She returns to the office today for an evaluation.  Past Medical History:  Diagnosis Date   Anxiety    Chest pressure    Degenerative arthritis    Deviated septum    Diabetes mellitus without complication (HCC)    diet controlled   Diabetic neuropathy ([place]) 12/03/2017   DJD (degenerative joint disease) of knee    Family history of bladder cancer    Family history of brain cancer    Family history of breast cancer    Family history of ovarian cancer    Family history of prostate cancer    Family history of stomach cancer    Family history of throat cancer    Family history of uterine cancer    Graves disease    RADIOACTIVE IODINE 1983   H/O hiatal hernia    Heart palpitations    History of diastolic dysfunction    per echo in 2011   Hyperlipidemia    Hypertension    Hypothyroidism    LVH (left ventricular hypertrophy)    per echo in 2011   Mitral valve prolapse    Neuropathy, peripheral    DR. Jannifer Franklin 12/15   Normal nuclear stress test 2011   OSA (obstructive sleep apnea)    Polyneuropathy in other diseases  classified elsewhere ([place]) 05/21/2013   Rosacea 2019   MELANOMA CHEST REMOVED IN SITU   Sleep difficulties    Tinnitus     Past Surgical History:  Procedure Laterality Date   ABDOMINAL HYSTERECTOMY  2000   TOTAL   APPENDECTOMY  1989   CARDIOVASCULAR STRESS TEST  11/09/2009   EF 75%   CARPAL TUNNEL RELEASE  2002   bilateral   COLONOSCOPY WITH PROPOFOL N/A 10/23/2018   Procedure: COLONOSCOPY WITH PROPOFOL;  Surgeon: [name], MD;  Location: [facility];  Service: Endoscopy;  Laterality: N/A;   ESOPHAGOGASTRODUODENOSCOPY (EGD) WITH PROPOFOL N/A 10/23/2018   Procedure: ESOPHAGOGASTRODUODENOSCOPY (EGD) WITH PROPOFOL;  Surgeon: [name], MD;  Location: [facility];  Service: Endoscopy;  Laterality: N/A;   EUS N/A 10/23/2018   Procedure: UPPER ENDOSCOPIC ULTRASOUND (EUS) RADIAL;  Surgeon: [name], MD;  Location: [facility];  Service: Endoscopy;  Laterality: N/A;   MASS EXCISION Left 06/02/2019   Procedure: EXCISION OF SUBCUTANEOUS ABDOMINAL WALL MASSES LEFT LOWER QUADRANT;  Surgeon: [name], MD;  Location: [place];  Service: General;  Laterality: Left;   TONSILLECTOMY  1958   TRANSTHORACIC ECHOCARDIOGRAM  11/01/2009   NORMAL LV FILLING AND DIASTOLIC DYSFUNCTION AND MILD  AORTIC SCLEROSIS AND TRACE MITRAL REGURGITATION    Family History  Problem Relation Age of Onset   Hypertension Mother    Heart disease Mother    Dementia Mother    Heart disease Father    Arthritis Father    Diabetes Father    Hypertension Father    Pancreatic cancer Brother 10       pancreatic   Other Brother        automobile accident   Stroke Maternal Grandmother    Heart disease Maternal Grandfather    Brain cancer Paternal Aunt 53       malignant brain cancer   Cancer Other 49       pancreatic neuroendocrine carcinoma   Pancreatic cancer Cousin 58       pancreatic (maternal first cousin)   Pancreatic cancer Cousin 13       pancreatic, smoker (maternal first cousin)    Breast cancer Cousin 44       breast (maternal first cousin)   Ovarian cancer Cousin    Uterine cancer Cousin 44       uterine (paternal first cousin)   Cancer Cousin 44       unknown primary (paternal first cousin)   Bladder Cancer Cousin 55       (paternal first cousin)   Throat cancer Maternal Uncle    Lung cancer Cousin    Throat cancer Cousin    Prostate cancer Cousin        (maternal first cousin)   Stomach cancer Cousin 92       (maternal first cousin)   Colon cancer Neg Hx     Social history:  reports that she quit smoking about 45 years ago. She has a 5.00 pack-year smoking history. She has never used smokeless tobacco. She reports current alcohol use. She reports that she does not use drugs.    Allergies  Allergen Reactions   Tobramycin Hives   Sulfa Antibiotics     Other reaction(s): stomach upset   Elemental Sulfur Nausea Only   Erythromycin Nausea Only    Medications:  Prior to Admission medications   Medication Sig Start Date End Date Taking? Authorizing Provider  acetaminophen (TYLENOL) 325 MG tablet Take 650 mg by mouth every 6 (six) hours as needed.   Yes [provider]  ALPRAZolam (XANAX) 0.25 MG tablet Take 0.25 mg by mouth daily as needed for anxiety.    Yes [provider]  Cholecalciferol (VITAMIN D) 50 MCG (2000 UT) tablet Take 2,000 Units by mouth daily.    Yes [provider]  fluticasone (FLONASE) 50 MCG/ACT nasal spray Place 2 sprays into both nostrils daily as needed for allergies or rhinitis.    Yes [provider]  furosemide (LASIX) 20 MG tablet Take 10 mg by mouth daily.   Yes [provider]  gabapentin (NEURONTIN) 100 MG capsule Take 1 capsule (100 mg total) by mouth 3 (three) times daily. PRN 08/11/20  Yes Kathrynn Ducking, MD  gabapentin (NEURONTIN) 300 MG capsule Take 300 mg by mouth at bedtime.    Yes Kathyrn Lass, MD  levothyroxine (SYNTHROID, LEVOTHROID) 100 MCG tablet Take 100 mcg by mouth  daily before breakfast.   Yes [provider]  metFORMIN (GLUCOPHAGE) 500 MG tablet 1 tablet with a meal 10/03/20  Yes [provider]  metoprolol succinate (TOPROL-XL) 25 MG 24 hr tablet TAKE 1/2 TABLET BY MOUTH DAILY. 11/26/14  Yes Darlin Coco, MD  metroNIDAZOLE (METROCREAM) 0.75 %  cream Apply 1 application topically daily.  12/21/14  Yes [provider]  potassium chloride SA (K-DUR,KLOR-CON) 20 MEQ tablet TAKE 1/2 TABLET BY MOUTH DAILY. 09/30/15  Yes Darlin Coco, MD  Prenatal Vit-Fe Fumarate-FA (PRENATAL MULTIVITAMIN) TABS tablet Take 1 tablet by mouth daily at 12 noon.   Yes [provider]  rosuvastatin (CRESTOR) 5 MG tablet Take 1 tablet (5 mg total) by mouth daily. 07/04/15  Yes Darlin Coco, MD    ROS:  Out of a complete 14 system review of symptoms, the patient complains only of the following symptoms, and all other reviewed systems are negative.  Coldness of the feet Joint pain Low back pain  Blood pressure 130/71, pulse 74, height '5\' 6"'$  (1.676 m), weight 152 lb (68.9 kg).  Physical Exam  General: The patient is alert and cooperative at the time of the examination.  Skin: No significant peripheral edema is noted.   Neurologic Exam  Mental status: The patient is alert and oriented x 3 at the time of the examination. The patient has apparent normal recent and remote memory, with an apparently normal attention span and concentration ability.   Cranial nerves: Facial symmetry is present. Speech is normal, no aphasia or dysarthria is noted. Extraocular movements are full. Visual fields are full.  Motor: The patient has good strength in all 4 extremities.  Sensory examination: Soft touch sensation is symmetric on the face, arms, and legs.  Coordination: The patient has good finger-nose-finger and heel-to-shin bilaterally.  Gait and station: The patient has a normal gait. Tandem gait is normal. Romberg is negative. No drift  is seen.  The patient is able to walk on heels and the toes bilaterally.  Reflexes: Deep tendon reflexes are symmetric.   Assessment/Plan:  1.  Diabetic peripheral neuropathy  The patient will continue her gabapentin.  We discussed possibly adding Cymbalta to this, but the patient wants to wait until she gets on the CPAP to see if she sleeps better at night.  The patient will call me if she wants to go on the Cymbalta.  I have also recommended getting on alpha lipoic acid which she has not yet started.  She may look into potentially initiating Anodyne therapy for the neuropathy.  She will follow-up here in 6 months.  In the future, she can be seen through Dr. Krista Blue.  Jill Alexanders MD 04/04/2021 8:02 AM  [facility] [address] [place], [place] [address]  Phone [phone] Fax [phone]

## 2021-04-04 NOTE — Patient Instructions (Signed)
May consider Anodyne therapy for the neuropathy.

## 2021-04-06 ENCOUNTER — Encounter (HOSPITAL_COMMUNITY): Admission: RE | Payer: Self-pay | Source: Home / Self Care

## 2021-04-06 ENCOUNTER — Ambulatory Visit (HOSPITAL_COMMUNITY): Admission: RE | Admit: 2021-04-06 | Payer: Medicare PPO | Source: Home / Self Care | Admitting: Gastroenterology

## 2021-04-06 DIAGNOSIS — M170 Bilateral primary osteoarthritis of knee: Secondary | ICD-10-CM | POA: Diagnosis not present

## 2021-04-06 SURGERY — UPPER ENDOSCOPIC ULTRASOUND (EUS) RADIAL
Anesthesia: Monitor Anesthesia Care

## 2021-04-12 DIAGNOSIS — Z20822 Contact with and (suspected) exposure to covid-19: Secondary | ICD-10-CM | POA: Diagnosis not present

## 2021-04-13 DIAGNOSIS — M1712 Unilateral primary osteoarthritis, left knee: Secondary | ICD-10-CM | POA: Diagnosis not present

## 2021-04-13 DIAGNOSIS — M1711 Unilateral primary osteoarthritis, right knee: Secondary | ICD-10-CM | POA: Diagnosis not present

## 2021-04-13 DIAGNOSIS — M170 Bilateral primary osteoarthritis of knee: Secondary | ICD-10-CM | POA: Diagnosis not present

## 2021-04-20 DIAGNOSIS — M1711 Unilateral primary osteoarthritis, right knee: Secondary | ICD-10-CM | POA: Diagnosis not present

## 2021-04-20 DIAGNOSIS — M170 Bilateral primary osteoarthritis of knee: Secondary | ICD-10-CM | POA: Diagnosis not present

## 2021-04-20 DIAGNOSIS — M1712 Unilateral primary osteoarthritis, left knee: Secondary | ICD-10-CM | POA: Diagnosis not present

## 2021-05-09 DIAGNOSIS — H903 Sensorineural hearing loss, bilateral: Secondary | ICD-10-CM | POA: Diagnosis not present

## 2021-05-11 DIAGNOSIS — G4733 Obstructive sleep apnea (adult) (pediatric): Secondary | ICD-10-CM | POA: Diagnosis not present

## 2021-05-16 DIAGNOSIS — H2513 Age-related nuclear cataract, bilateral: Secondary | ICD-10-CM | POA: Diagnosis not present

## 2021-05-16 DIAGNOSIS — E119 Type 2 diabetes mellitus without complications: Secondary | ICD-10-CM | POA: Diagnosis not present

## 2021-05-16 DIAGNOSIS — H52203 Unspecified astigmatism, bilateral: Secondary | ICD-10-CM | POA: Diagnosis not present

## 2021-05-23 ENCOUNTER — Telehealth: Payer: Self-pay | Admitting: Nurse Practitioner

## 2021-05-23 DIAGNOSIS — E039 Hypothyroidism, unspecified: Secondary | ICD-10-CM | POA: Diagnosis not present

## 2021-05-23 DIAGNOSIS — I10 Essential (primary) hypertension: Secondary | ICD-10-CM | POA: Diagnosis not present

## 2021-05-23 DIAGNOSIS — G4733 Obstructive sleep apnea (adult) (pediatric): Secondary | ICD-10-CM | POA: Diagnosis not present

## 2021-05-23 NOTE — Telephone Encounter (Signed)
Rescheduled per provider template change, pt has been called and confirmed appt

## 2021-05-29 DIAGNOSIS — H903 Sensorineural hearing loss, bilateral: Secondary | ICD-10-CM | POA: Diagnosis not present

## 2021-05-29 DIAGNOSIS — Z9989 Dependence on other enabling machines and devices: Secondary | ICD-10-CM | POA: Diagnosis not present

## 2021-05-29 DIAGNOSIS — H9313 Tinnitus, bilateral: Secondary | ICD-10-CM | POA: Diagnosis not present

## 2021-05-29 DIAGNOSIS — H6121 Impacted cerumen, right ear: Secondary | ICD-10-CM | POA: Diagnosis not present

## 2021-05-29 DIAGNOSIS — G4733 Obstructive sleep apnea (adult) (pediatric): Secondary | ICD-10-CM | POA: Diagnosis not present

## 2021-06-10 DIAGNOSIS — G4733 Obstructive sleep apnea (adult) (pediatric): Secondary | ICD-10-CM | POA: Diagnosis not present

## 2021-06-21 DIAGNOSIS — G4700 Insomnia, unspecified: Secondary | ICD-10-CM | POA: Diagnosis not present

## 2021-06-21 DIAGNOSIS — G4733 Obstructive sleep apnea (adult) (pediatric): Secondary | ICD-10-CM | POA: Diagnosis not present

## 2021-06-30 DIAGNOSIS — Z1231 Encounter for screening mammogram for malignant neoplasm of breast: Secondary | ICD-10-CM | POA: Diagnosis not present

## 2021-06-30 DIAGNOSIS — Z780 Asymptomatic menopausal state: Secondary | ICD-10-CM | POA: Diagnosis not present

## 2021-06-30 DIAGNOSIS — M85852 Other specified disorders of bone density and structure, left thigh: Secondary | ICD-10-CM | POA: Diagnosis not present

## 2021-07-11 DIAGNOSIS — Z9989 Dependence on other enabling machines and devices: Secondary | ICD-10-CM | POA: Diagnosis not present

## 2021-07-11 DIAGNOSIS — G4733 Obstructive sleep apnea (adult) (pediatric): Secondary | ICD-10-CM | POA: Diagnosis not present

## 2021-07-19 DIAGNOSIS — G4700 Insomnia, unspecified: Secondary | ICD-10-CM | POA: Diagnosis not present

## 2021-07-19 DIAGNOSIS — G4733 Obstructive sleep apnea (adult) (pediatric): Secondary | ICD-10-CM | POA: Diagnosis not present

## 2021-07-21 ENCOUNTER — Ambulatory Visit: Payer: Medicare PPO | Admitting: Cardiovascular Disease

## 2021-07-24 NOTE — Progress Notes (Signed)
Chief Complaint  Patient presents with   Follow-up    Mitral valve prolapse    History of Present Illness: 75 yo female with history of mitral valve prolapse, Graves disease, DM, HTN, hyperlipidemia, anxiety, peripheral neuropathy, sleep apnea and hypothyroidism who is here today for cardiac follow up. She had been followed in the past by Dr. Mare Ferrari and Dr. Debara Pickett. I met her for the first time in 2021 and she had no complaints. Nuclear stress test in 2013 with no ischemia. Echo November 2021 with LVEF=60-65%, no significant valve disease. Exercise stress test November 2021 with no ischemia. She was diagnosed with sleep apnea in 2022 and is trying to use CPAP. She is now on metformin.   She is here today for follow up. The patient denies any chest pain, dyspnea, palpitations, lower extremity edema, orthopnea, PND, dizziness, near syncope or syncope.   Primary Care Physician: Kathyrn Lass, MD  Past Medical History:  Diagnosis Date   Anxiety    Chest pressure    Degenerative arthritis    Deviated septum    Diabetes mellitus without complication ([place])    diet controlled   Diabetic neuropathy ([place]) 12/03/2017   DJD (degenerative joint disease) of knee    Family history of bladder cancer    Family history of brain cancer    Family history of breast cancer    Family history of ovarian cancer    Family history of prostate cancer    Family history of stomach cancer    Family history of throat cancer    Family history of uterine cancer    Graves disease    RADIOACTIVE IODINE 1983   H/O hiatal hernia    Heart palpitations    History of diastolic dysfunction    per echo in 2011   Hyperlipidemia    Hypertension    Hypothyroidism    LVH (left ventricular hypertrophy)    per echo in 2011   Mitral valve prolapse    Neuropathy, peripheral    DR. Jannifer Franklin 12/15   Normal nuclear stress test 2011   OSA (obstructive sleep apnea)    Polyneuropathy in other diseases classified elsewhere  ([place]) 05/21/2013   Rosacea 2019   MELANOMA CHEST REMOVED IN SITU   Sleep difficulties    Tinnitus     Past Surgical History:  Procedure Laterality Date   ABDOMINAL HYSTERECTOMY  2000   TOTAL   APPENDECTOMY  1989   CARDIOVASCULAR STRESS TEST  11/09/2009   EF 75%   CARPAL TUNNEL RELEASE  2002   bilateral   COLONOSCOPY WITH PROPOFOL N/A 10/23/2018   Procedure: COLONOSCOPY WITH PROPOFOL;  Surgeon: [name], MD;  Location: [facility];  Service: Endoscopy;  Laterality: N/A;   ESOPHAGOGASTRODUODENOSCOPY (EGD) WITH PROPOFOL N/A 10/23/2018   Procedure: ESOPHAGOGASTRODUODENOSCOPY (EGD) WITH PROPOFOL;  Surgeon: [name], MD;  Location: [facility];  Service: Endoscopy;  Laterality: N/A;   EUS N/A 10/23/2018   Procedure: UPPER ENDOSCOPIC ULTRASOUND (EUS) RADIAL;  Surgeon: [name], MD;  Location: [facility];  Service: Endoscopy;  Laterality: N/A;   MASS EXCISION Left 06/02/2019   Procedure: EXCISION OF SUBCUTANEOUS ABDOMINAL WALL MASSES LEFT LOWER QUADRANT;  Surgeon: [name], MD;  Location: [facility];  Service: General;  Laterality: Left;   TONSILLECTOMY  1958   TRANSTHORACIC ECHOCARDIOGRAM  11/01/2009   NORMAL LV FILLING AND DIASTOLIC DYSFUNCTION AND MILD AORTIC SCLEROSIS AND TRACE MITRAL REGURGITATION    Current Outpatient Medications  Medication Sig Dispense  Refill   acetaminophen (TYLENOL) 325 MG tablet Take 650 mg by mouth every 6 (six) hours as needed.     ALPRAZolam (XANAX) 0.25 MG tablet Take 0.25 mg by mouth daily as needed for anxiety.      Cholecalciferol (VITAMIN D) 50 MCG (2000 UT) tablet Take 2,000 Units by mouth daily.      fluticasone (FLONASE) 50 MCG/ACT nasal spray Place 2 sprays into both nostrils daily as needed for allergies or rhinitis.      furosemide (LASIX) 20 MG tablet Take 10 mg by mouth daily.     gabapentin (NEURONTIN) 100 MG capsule Take 1 capsule (100 mg total) by mouth 3 (three) times daily. PRN 90 capsule 1   gabapentin (NEURONTIN)  300 MG capsule Take 300 mg by mouth at bedtime.      levothyroxine (SYNTHROID, LEVOTHROID) 100 MCG tablet Take 100 mcg by mouth daily before breakfast.     metFORMIN (GLUCOPHAGE) 500 MG tablet 1 tablet with a meal     metoprolol succinate (TOPROL-XL) 25 MG 24 hr tablet TAKE 1/2 TABLET BY MOUTH DAILY. 45 tablet 3   metroNIDAZOLE (METROCREAM) 0.75 % cream Apply 1 application topically daily.      potassium chloride SA (K-DUR,KLOR-CON) 20 MEQ tablet TAKE 1/2 TABLET BY MOUTH DAILY. 45 tablet 3   Prenatal Vit-Fe Fumarate-FA (PRENATAL MULTIVITAMIN) TABS tablet Take 1 tablet by mouth daily at 12 noon.     rosuvastatin (CRESTOR) 5 MG tablet Take 1 tablet (5 mg total) by mouth daily. 90 tablet 3   No current facility-administered medications for this visit.    Allergies  Allergen Reactions   Tobramycin Hives   Sulfa Antibiotics     Other reaction(s): stomach upset   Elemental Sulfur Nausea Only   Erythromycin Nausea Only    Social History   Socioeconomic History   Marital status: Married    Spouse name: Edd Fabian   Number of children: 1   Years of education: COLLEGE   Highest education level: Not on file  Occupational History   Occupation: Retired  Tobacco Use   Smoking status: Former    Packs/day: 0.50    Years: 10.00    Pack years: 5.00    Types: Cigarettes    Quit date: 08/14/1975    Years since quitting: 45.9   Smokeless tobacco: Never  Vaping Use   Vaping Use: Never used  Substance and Sexual Activity   Alcohol use: Yes    Comment: one glass of wine a week   Drug use: No   Sexual activity: Not on file  Other Topics Concern   Not on file  Social History Narrative   Lives at home w/ her husband, Edd Fabian.   Right-handed.   Drinks no caffeine but does eat chocolate occasionally.   epworth sleepiness scale = 3 (01/10/16)   Social Determinants of Health   Financial Resource Strain: Not on file  Food Insecurity: Not on file  Transportation Needs: Not on file  Physical  Activity: Not on file  Stress: Not on file  Social Connections: Not on file  Intimate Partner Violence: Not on file    Family History  Problem Relation Age of Onset   Hypertension Mother    Heart disease Mother    Dementia Mother    Heart disease Father    Arthritis Father    Diabetes Father    Hypertension Father    Pancreatic cancer Brother 60       pancreatic   Other Brother  automobile accident   Stroke Maternal Grandmother    Heart disease Maternal Grandfather    Brain cancer Paternal Aunt 75       malignant brain cancer   Cancer Other 49       pancreatic neuroendocrine carcinoma   Pancreatic cancer Cousin 23       pancreatic (maternal first cousin)   Pancreatic cancer Cousin 53       pancreatic, smoker (maternal first cousin)   Breast cancer Cousin 76       breast (maternal first cousin)   Ovarian cancer Cousin    Uterine cancer Cousin 56       uterine (paternal first cousin)   Cancer Cousin 27       unknown primary (paternal first cousin)   Bladder Cancer Cousin 12       (paternal first cousin)   Throat cancer Maternal Uncle    Lung cancer Cousin    Throat cancer Cousin    Prostate cancer Cousin        (maternal first cousin)   Stomach cancer Cousin 19       (maternal first cousin)   Colon cancer Neg Hx     Review of Systems:  As stated in the HPI and otherwise negative.   BP 122/72   Pulse 74   Ht _0  (1.676 m)   BMI 24.53 kg/m   Physical Examination:  General: Well developed, well nourished, NAD  HEENT: OP clear, mucus membranes moist  SKIN: warm, dry. No rashes. Neuro: No focal deficits  Musculoskeletal: Muscle strength 5/5 all ext  Psychiatric: Mood and affect normal  Neck: No JVD, no carotid bruits, no thyromegaly, no lymphadenopathy.  Lungs:Clear bilaterally, no wheezes, rhonci, crackles Cardiovascular: Regular rate and rhythm. No murmurs, gallops or rubs. Abdomen:Soft. Bowel sounds present. Non-tender.  Extremities: No lower  extremity edema. Pulses are 2 + in the bilateral DP/PT.  EKG:  EKG is ordered today The ekg ordered today demonstrates Sinus  Echo 07/12/20:  1. Left ventricular ejection fraction, by estimation, is 60 to 65%. The  left ventricle has normal function. The left ventricle has no regional  wall motion abnormalities. Left ventricular diastolic parameters are  consistent with Grade I diastolic  dysfunction (impaired relaxation). Elevated left atrial pressure. The  average left ventricular global longitudinal strain is -21.6 %. The global  longitudinal strain is normal.   2. Right ventricular systolic function is normal. The right ventricular  size is normal. There is normal pulmonary artery systolic pressure.   3. The mitral valve is normal in structure. Trivial mitral valve  regurgitation. No evidence of mitral stenosis.   4. The aortic valve is normal in structure. Aortic valve regurgitation is  trivial. No aortic stenosis is present.   5. The inferior vena cava is normal in size with greater than 50%  respiratory variability, suggesting right atrial pressure of 3 mmHg.   Recent Labs: 11/15/2020: ALT 21; BUN 13; Creatinine 0.74; Hemoglobin 13.7; Platelet Count 208; Potassium 4.2; Sodium 139   Lipid Panel    Component Value Date/Time   CHOL 134 01/04/2016 0739   TRIG 91 01/04/2016 0739   HDL 39 (L) 01/04/2016 0739   CHOLHDL 3.4 01/04/2016 0739   VLDL 18 01/04/2016 0739   LDLCALC 77 01/04/2016 0739     Wt Readings from Last 3 Encounters:  04/04/21 152 lb (68.9 kg)  11/11/20 155 lb 2 oz (70.4 kg)  09/28/20 155 lb 6.4 oz (70.5 kg)  Assessment and Plan:   1. Mitral valve prolapse: Trivial MR by echo in 2021.   Current medicines are reviewed at length with the patient today.  The patient does not have concerns regarding medicines.  The following changes have been made:  no change  Labs/ tests ordered today include:   Orders Placed This Encounter  Procedures   EKG  12-Lead    Disposition:   F/U with me in one year.   Signed, Lauree Chandler, MD 07/25/2021 9:41 AM    [facility] [address], [place], [place]  [address] Phone: [phone]; Fax: [phone]

## 2021-07-25 ENCOUNTER — Encounter: Payer: Self-pay | Admitting: Cardiovascular Disease

## 2021-07-25 ENCOUNTER — Ambulatory Visit: Payer: Medicare PPO | Admitting: Cardiovascular Disease

## 2021-07-25 ENCOUNTER — Other Ambulatory Visit: Payer: Self-pay

## 2021-07-25 VITALS — BP 122/72 | HR 74 | Ht 66.0 in

## 2021-07-25 DIAGNOSIS — I341 Nonrheumatic mitral (valve) prolapse: Secondary | ICD-10-CM | POA: Diagnosis not present

## 2021-07-25 NOTE — Patient Instructions (Signed)
Medication Instructions:  No changes *If you need a refill on your cardiac medications before your next appointment, please call your pharmacy*   Lab Work: none   Testing/Procedures: none   Follow-Up: At [facility], you and your health needs are our priority.  As part of our continuing mission to provide you with exceptional heart care, we have created designated Provider Care Teams.  These Care Teams include your primary Cardiologist (physician) and Advanced Practice Providers (APPs -  Physician Assistants and Nurse Practitioners) who all work together to provide you with the care you need, when you need it.   Your next appointment:   12 month(s)  The format for your next appointment:   In Person  Provider:   Christopher McAlhany, MD    

## 2021-08-29 DIAGNOSIS — E1140 Type 2 diabetes mellitus with diabetic neuropathy, unspecified: Secondary | ICD-10-CM | POA: Diagnosis not present

## 2021-09-01 DIAGNOSIS — I10 Essential (primary) hypertension: Secondary | ICD-10-CM | POA: Diagnosis not present

## 2021-09-01 DIAGNOSIS — E1140 Type 2 diabetes mellitus with diabetic neuropathy, unspecified: Secondary | ICD-10-CM | POA: Diagnosis not present

## 2021-09-01 DIAGNOSIS — E785 Hyperlipidemia, unspecified: Secondary | ICD-10-CM | POA: Diagnosis not present

## 2021-09-01 DIAGNOSIS — G629 Polyneuropathy, unspecified: Secondary | ICD-10-CM | POA: Diagnosis not present

## 2021-09-12 ENCOUNTER — Ambulatory Visit: Payer: Medicare PPO | Admitting: Cardiovascular Disease

## 2021-09-14 DIAGNOSIS — L820 Inflamed seborrheic keratosis: Secondary | ICD-10-CM | POA: Diagnosis not present

## 2021-09-14 DIAGNOSIS — L718 Other rosacea: Secondary | ICD-10-CM | POA: Diagnosis not present

## 2021-09-14 DIAGNOSIS — Z85820 Personal history of malignant melanoma of skin: Secondary | ICD-10-CM | POA: Diagnosis not present

## 2021-09-14 DIAGNOSIS — D1801 Hemangioma of skin and subcutaneous tissue: Secondary | ICD-10-CM | POA: Diagnosis not present

## 2021-09-14 DIAGNOSIS — D225 Melanocytic nevi of trunk: Secondary | ICD-10-CM | POA: Diagnosis not present

## 2021-09-14 DIAGNOSIS — L111 Transient acantholytic dermatosis [Grover]: Secondary | ICD-10-CM | POA: Diagnosis not present

## 2021-09-14 DIAGNOSIS — L814 Other melanin hyperpigmentation: Secondary | ICD-10-CM | POA: Diagnosis not present

## 2021-09-14 DIAGNOSIS — L309 Dermatitis, unspecified: Secondary | ICD-10-CM | POA: Diagnosis not present

## 2021-09-14 DIAGNOSIS — D2272 Melanocytic nevi of left lower limb, including hip: Secondary | ICD-10-CM | POA: Diagnosis not present

## 2021-09-26 ENCOUNTER — Telehealth: Payer: Self-pay | Admitting: Gastroenterology

## 2021-09-26 NOTE — Telephone Encounter (Signed)
Inbound call from patient wanting to discuss setting up appointment for EUS procedure. Stated that you could give her a call after 11. Please advise.

## 2021-09-27 ENCOUNTER — Other Ambulatory Visit: Payer: Self-pay

## 2021-09-27 DIAGNOSIS — Z800 Family history of malignant neoplasm of digestive organs: Secondary | ICD-10-CM

## 2021-09-27 DIAGNOSIS — K862 Cyst of pancreas: Secondary | ICD-10-CM

## 2021-09-27 NOTE — Telephone Encounter (Signed)
The pt has been scheduled for EUS on 4/6 at  [facility] with DJ  EUS scheduled, pt instructed and medications reviewed.  Patient instructions mailed to home and sent to My Chart.  Patient to call with any questions or concerns.

## 2021-10-03 ENCOUNTER — Ambulatory Visit: Payer: Medicare PPO | Admitting: Neurology

## 2021-10-03 ENCOUNTER — Encounter: Payer: Self-pay | Admitting: Neurology

## 2021-10-03 VITALS — BP 122/66 | HR 71 | Ht 66.0 in | Wt 158.0 lb

## 2021-10-03 DIAGNOSIS — E1142 Type 2 diabetes mellitus with diabetic polyneuropathy: Secondary | ICD-10-CM

## 2021-10-03 DIAGNOSIS — E519 Thiamine deficiency, unspecified: Secondary | ICD-10-CM | POA: Diagnosis not present

## 2021-10-03 DIAGNOSIS — Z818 Family history of other mental and behavioral disorders: Secondary | ICD-10-CM | POA: Diagnosis not present

## 2021-10-03 DIAGNOSIS — E538 Deficiency of other specified B group vitamins: Secondary | ICD-10-CM

## 2021-10-03 DIAGNOSIS — G629 Polyneuropathy, unspecified: Secondary | ICD-10-CM | POA: Diagnosis not present

## 2021-10-03 NOTE — Patient Instructions (Addendum)
RETURN TO PRIMARY CARE.  - Continue Gabapentin -May consider daily alpha lipoic acid which is an antioxidant that may reduce free radical oxidative stress associated with diabetic polyneuropathy, existing evidence suggests that alpha lipoic acid significantly reduces stabbing, lancinating and burning pain and diabetic neuropathy with its onset of action as early as 1-2 weeks. 600mg  daily and then up to twice daily. - Follow with Dr. Kathyrn Lass for further treatment - Mother had dementia, she has no symptoms, if she is concerned she can see  Dr Kathyrn Lass who can send you for formal neurocognitive testing for a baseline if concerned but she has no symptoms of neurocognitive disorder at this time - Read The "XX Brain" - Look up the Rattan brain study at [facility]  Recommendations to prevent or slow progression of cognitive decline:   Exercise You should increase exercise 30 to 45 minutes per day at least 3 days a week although 5 to 7 would be preferred. Any type of exercise (including walking) is acceptable although a recumbent bicycle may be best if you are unsteady. Disease related apathy can be a significant roadblock to exercise and the only way to overcome this is to make it a daily routine and perhaps have a reward at the end (something your loved one loves to eat or drink perhaps) or a personal trainer coming to the home can also be very useful. In general a structured, repetitive schedule is best.   Cardiovascular Health: You should optimize all cardiovascular risk factors (blood pressure, sugar, cholesterol) as vascular disease such as strokes and heart attacks can make memory problems much worse.   Diet: Eating a heart healthy (Mediterranean) diet is also a good idea; fish and poultry instead of red meat, nuts (mostly non-peanuts), vegetables, fruits, olive oil or canola oil (instead of butter), minimal salt (use other spices to flavor foods), whole grain rice, bread, cereal  and pasta and wine in moderation.  General Health: Any diseases which effect your body will effect your brain such as a pneumonia, urinary infection, blood clot, heart attack or stroke. Keep contact with your primary care doctor for regular follow ups.  Sleep. A good nights sleep is healthy for the brain. Seven hours is recommended. If you have insomnia or poor sleep habits see the recommendations below  Tips: Structured and consistent daytime and nighttime routine, including regular wake times, bedtimes, and mealtimes, will be important for the patient to avoid confusion. Keeping frequently used items in designated places will help reduce stress from searching. If there are worries about getting lost do not let the patient leave home unaccompanied. They might benefit from wearing an identification bracelet that will help others assist in finding home if they become lost. Information about nationwide safe return services and other helpful resources may be obtained through the [place] helpline at [phone].  Finances, Power of Producer, television/film/video Directives: You should consider putting legal safeguards in place with regard to financial and medical decision making. While the spouse always has power of attorney for medical and financial issues in the absence of any form, you should consider what you want in case the spouse / caregiver is no longer around or capable of making decisions.   [place] : http://www.welch.com/.pdf  Or Google "[place]" AND "An Forensic scientist for [employer]  Other States: ApartmentMom.com.ee  The signature on these forms should be notarized.   RESOURCES:  Memory Loss: Improve your short term memory By Silvio Pate  The Alzheimer's Reading Room http://www.alzheimersreadingroom.com/   The Alzheimer's  Compendium http://www.alzcompend.info/  [employer] www.dukefamilysupport.OEV [phone]  Recommended resources for caregivers (All can be purchased on [employer]):  1) A Caregiver's Guide to Dementia: Using Activities and Other Strategies to Prevent, Reduce and Manage Behavioral Symptoms by Osie Bond. Tyler Aas and [employer]   2) A Caregiver's Guide to [employer] Dementia by Caleen Essex MS BSN and Gaston Islam   3) What If It's Not Alzheimer's?: A Caregiver's Guide to Dementia by Koren Shiver (Author), Octaviano Batty (Editor)  3) The 36 hour day by Rabins and Mace  4) Understanding Difficult Behaviors by Merita Norton and White  Online course for helping caregivers reduce stress, guilt and frustration called the Caregivers Helpbook. The website is www.powerfultoolsforcaregivers.org  As a caregiver you are a Art gallery manager. Problems you face as a caregiver are usually unique to your situation and the way your loved-one's disease manifests itself. The best way to use these books is to look at the Table of Contents and read any chapters of interest or that apply to challenges you are having as a caregiver.  NATIONAL RESOURCES: For more information on neurological disorders or research programs funded by the [employer] of Neurological Disorders and Stroke, contact the [place] (BRAIN) at: [place] [address] [phone] (toll-free) MasterBoxes.it  Information on dementia is also available from the following organizations: Alzheimers Disease Education and Referral ([place]) [place] on Aging [address] [place] [address] [phone] (toll-free) DVDEnthusiasts.nl  Alzheimers Association [address], [place] [phone] (toll-free, 24-hour helpline) [phone] (TDD) CapitalMile.co.nz   Alzheimers Foundation of America [address], [place] [address] [phone] (toll-free) www.alzfdn.Energy [address] [place] [address] [phone] www.alzdiscovery.org  Association for [address] [address], [place] [address] [phone] (toll-free) [place] [address] [phone] (toll-free) www.brightfocus.org/alzheimers  [facility] [address], [place] [address] [phone] www.https://lambert-jackson.net/  Lewy Body Dementia Association [address], [place] [address] [phone] (toll-free LBD Caregiver Link) www.lbda.[place], [place], [place] [address] [phone] (toll-free) [phone] [facility]) https://carter.com/  National Organization for Rare Disorders [address] [place] [address] 5-916-384-YKZL ([phone]) (toll-free) www.rarediseases.org  The Dementias: Hope Through Research was jointly produced by the [employer] of Neurological Disorders and Stroke (NINDS) and the [employer] on Aging (NIA), both part of the [place] agency--supporting scientific studies that turn discovery into health. NINDS is the nations leading funder of research on the brain and nervous system. The NINDS mission is to reduce the burden of neurological disease. For more information and resources, visit MasterBoxes.it [1] or call 613-247-9388. NIA leads the federal government effort conducting and supporting research on aging and the health and well-being of older people. NIAs Alzheimers Disease Education and Referral (ADEAR) Center offers  information and publications on dementia and caregiving for families, caregivers, and professionals. For more information, visit DVDEnthusiasts.nl [2] or call [phone]. Also available from NIA are publications and information about Alzheimers disease as well as the booklets Frontotemporal Disorders: Information for Patients, Families, and Caregivers and Lewy Body Dementia: Information for Patients, Families, and Professionals. Source URL: SocialSpecialists.co.nz

## 2021-10-03 NOTE — Progress Notes (Signed)
Reason for visit: Diabetic peripheral neuropathy  Yvonne [name] is an 76 y.o. female  Interval history October 03, 2021: This is a patient with a history of diabetes and associated diabetes peripheral neuropathy.  She has been seeing my colleague Dr. Jannifer Franklin who recently retired.  She has sleep apnea and at last visit was starting a CPAP.  We have been seeing her prescribing gabapentin yearly, I think at this point patient can be transition back to Dr. Kathyrn Lass for her pain management of diabetic neuropathy.  Per review of records her B12 has been checked in the past and its been normal 546, in 2014 and appears she had an ANA and rheumatoid factor and angio tensing converting enzyme and vitamin B12, and TSH for any autoimmune disorders, I can recheck several labs today to ensure no other reason for her peripheral neuropathy however at this time I think she can be managed by primary care.   We discussed, her feetare always cold, they never feel cold to the touch just the sensation. We discussed, her feetare always cold, they never feel cold to the touch just the sensation. Last HgbA1c was 7.8 so very likely diabetes, she brought up Thyroid disease and crestor and I explained it is likely diabetic. But we will order a few more testsLast HgbA1c was 7.8 so very likely diabetes, she brought up Thyroid disease and crestor and I explained it is likely diabetic.  She can go to podiatry for diabetic shoes, examine toes every day for cuts, wear shoes in the house, discussed topical lidocaine or capsaicin. She has burning the feet as well. Stable. In the feet only.Patient's mother had FTD, we discussed, she has no symptoms, no ned for testing now, but may consider formal neurocognitive testing with Dr. Kathyrn Lass if clinically warranted. Best thing she can do is manage her diabetes and medical conditions and treat her sleep apnea, read XX brain, discussed MIND diet. FTD is much less less familial than  Alzheimer's disease.   History of present illness:  Yvonne [name] is a 76 year old right-handed white female with a history of diabetes with associated diabetic peripheral neuropathy.  The patient recently has been diagnosed with sleep apnea, she has not yet started CPAP.  She wakes up 4 or 5 times a night because of this.  She reports ongoing problems of having her feet feel cold.  She generally will take about 400 mg of gabapentin in the evening.  This does help some but does not alleviate all of her issues.  She will use a warming blanket for her legs at night that helps.  She denies any significant balance issues, she has not had any falls.  She does have some low back pain at times, she has arthritis in the knees bilaterally.  She is now on metformin, her most recent hemoglobin A1c was about 6.8.  She returns to the office today for an evaluation.  Past Medical History:  Diagnosis Date   Anxiety    Chest pressure    Degenerative arthritis    Deviated septum    Diabetes mellitus without complication (HCC)    diet controlled   Diabetic neuropathy ([place]) 12/03/2017   DJD (degenerative joint disease) of knee    Family history of bladder cancer    Family history of brain cancer    Family history of breast cancer    Family history of ovarian cancer    Family history of prostate cancer    Family  history of stomach cancer    Family history of throat cancer    Family history of uterine cancer    Graves disease    RADIOACTIVE IODINE 1983   H/O hiatal hernia    Heart palpitations    History of diastolic dysfunction    per echo in 2011   Hyperlipidemia    Hypertension    Hypothyroidism    LVH (left ventricular hypertrophy)    per echo in 2011   Mitral valve prolapse    Neuropathy, peripheral    DR. Jannifer Franklin 12/15   Normal nuclear stress test 2011   OSA (obstructive sleep apnea)    Polyneuropathy in other diseases classified elsewhere ([place]) 05/21/2013   Rosacea 2019   MELANOMA CHEST  REMOVED IN SITU   Sleep difficulties    Tinnitus     Past Surgical History:  Procedure Laterality Date   ABDOMINAL HYSTERECTOMY  2000   TOTAL   APPENDECTOMY  1989   CARDIOVASCULAR STRESS TEST  11/09/2009   EF 75%   CARPAL TUNNEL RELEASE  2002   bilateral   COLONOSCOPY WITH PROPOFOL N/A 10/23/2018   Procedure: COLONOSCOPY WITH PROPOFOL;  Surgeon: [name], MD;  Location: [facility];  Service: Endoscopy;  Laterality: N/A;   ESOPHAGOGASTRODUODENOSCOPY (EGD) WITH PROPOFOL N/A 10/23/2018   Procedure: ESOPHAGOGASTRODUODENOSCOPY (EGD) WITH PROPOFOL;  Surgeon: [name], MD;  [facility];  Service: Endoscopy;  Laterality: N/A;   EUS N/A 10/23/2018   Procedure: UPPER ENDOSCOPIC ULTRASOUND (EUS) RADIAL;  Surgeon: [name], MD;  [facility];  Service: Endoscopy;  Laterality: N/A;   MASS EXCISION Left 06/02/2019   Procedure: EXCISION OF SUBCUTANEOUS ABDOMINAL WALL MASSES LEFT LOWER QUADRANT;  Surgeon: [name], MD;  Location: [facility];  Service: General;  Laterality: Left;   [place] ECHOCARDIOGRAM  11/01/2009   NORMAL LV FILLING AND DIASTOLIC DYSFUNCTION AND MILD AORTIC SCLEROSIS AND TRACE MITRAL REGURGITATION    Family History  Problem Relation Age of Onset   Hypertension Mother    Heart disease Mother    Dementia Mother    Heart disease Father    Arthritis Father    Diabetes Father    Hypertension Father    Pancreatic cancer Brother 29       pancreatic   Other Brother        automobile accident   Stroke Maternal Grandmother    Heart disease Maternal Grandfather    Brain cancer Paternal Aunt 76       malignant brain cancer   Cancer Other 49       pancreatic neuroendocrine carcinoma   Pancreatic cancer Cousin 62       pancreatic (maternal first cousin)   Pancreatic cancer Cousin 14       pancreatic, smoker (maternal first cousin)   Breast cancer Cousin 16       breast (maternal first cousin)   Ovarian  cancer Cousin    Uterine cancer Cousin 20       uterine (paternal first cousin)   Cancer Cousin 78       unknown primary (paternal first cousin)   Bladder Cancer Cousin 76       (paternal first cousin)   Throat cancer Maternal Uncle    Lung cancer Cousin    Throat cancer Cousin    Prostate cancer Cousin        (maternal first cousin)   Stomach cancer Cousin 35       (  maternal first cousin)   Colon cancer Neg Hx     Social history:  reports that she quit smoking about 46 years ago. Her smoking use included cigarettes. She has a 5.00 pack-year smoking history. She has never used smokeless tobacco. She reports current alcohol use. She reports that she does not use drugs.    Allergies  Allergen Reactions   Tobramycin Hives   Sulfa Antibiotics     Other reaction(s): stomach upset   Elemental Sulfur Nausea Only   Erythromycin Nausea Only    Medications:  Prior to Admission medications   Medication Sig Start Date End Date Taking? Authorizing Provider  acetaminophen (TYLENOL) 325 MG tablet Take 650 mg by mouth every 6 (six) hours as needed.   Yes [provider]  ALPRAZolam (XANAX) 0.25 MG tablet Take 0.25 mg by mouth daily as needed for anxiety.    Yes [provider]  Cholecalciferol (VITAMIN D) 50 MCG (2000 UT) tablet Take 2,000 Units by mouth daily.    Yes [provider]  fluticasone (FLONASE) 50 MCG/ACT nasal spray Place 2 sprays into both nostrils daily as needed for allergies or rhinitis.    Yes [provider]  furosemide (LASIX) 20 MG tablet Take 10 mg by mouth daily.   Yes [provider]  gabapentin (NEURONTIN) 100 MG capsule Take 1 capsule (100 mg total) by mouth 3 (three) times daily. PRN 08/11/20  Yes Kathrynn Ducking, MD  gabapentin (NEURONTIN) 300 MG capsule Take 300 mg by mouth at bedtime.    Yes Kathyrn Lass, MD  levothyroxine (SYNTHROID, LEVOTHROID) 100 MCG tablet Take 100 mcg by mouth daily before breakfast.   Yes  [provider]  metFORMIN (GLUCOPHAGE) 500 MG tablet 1 tablet with a meal 10/03/20  Yes [provider]  metoprolol succinate (TOPROL-XL) 25 MG 24 hr tablet TAKE 1/2 TABLET BY MOUTH DAILY. 11/26/14  Yes Darlin Coco, MD  metroNIDAZOLE (METROCREAM) 0.75 % cream Apply 1 application topically daily.  12/21/14  Yes [provider]  potassium chloride SA (K-DUR,KLOR-CON) 20 MEQ tablet TAKE 1/2 TABLET BY MOUTH DAILY. 09/30/15  Yes Darlin Coco, MD  Prenatal Vit-Fe Fumarate-FA (PRENATAL MULTIVITAMIN) TABS tablet Take 1 tablet by mouth daily at 12 noon.   Yes [provider]  rosuvastatin (CRESTOR) 5 MG tablet Take 1 tablet (5 mg total) by mouth daily. 07/04/15  Yes Darlin Coco, MD    ROS:  Out of a complete 14 system review of symptoms, the patient complains only of the following symptoms, and all other reviewed systems are negative. Exam: NAD, pleasant                  Speech:    Speech is normal; fluent and spontaneous with normal comprehension.  Cognition:    The patient is oriented to person, place, and time;     recent and remote memory intact;     language fluent;    Cranial Nerves:    The pupils are equal, round, and reactive to light.Trigeminal sensation is intact and the muscles of mastication are normal. The face is symmetric. The palate elevates in the midline. Hearing intact. Voice is normal. Shoulder shrug is normal. The tongue has normal motion without fasciculations.   Coordination:  No dysmetria  Motor Observation:    No asymmetry, no atrophy, and no involuntary movements noted. Tone:    Normal muscle tone.     Strength:    Strength is V/V in the upper and lower limbs.  Sensation: intact to LT, slight decrease pin prick and temp distally in the feet, a few seconds vibration  DTRS: Absent AJs.    Assessment/Plan:  1.  Diabetic peripheral neuropathy  This is a patient with a history of very mild diabetes and  associated diabetes peripheral neuropathy.  She has been seeing my colleague Dr. Jannifer Franklin who recently retired.  She has sleep apnea and at last visit was starting a CPAP.  We have been seeing her prescribing gabapentin yearly, I think at this point patient can be transition back to Dr. Kathyrn Lass for her pain management of diabetic neuropathy.  Per review of records her B12 has been checked in the past and its been normal 546, in 2014 and appears she had an ANA and rheumatoid factor and angio tensing converting enzyme and vitamin B12, and TSH for any autoimmune disorders, I can recheck several labs today to ensure no other reason for her peripheral neuropathy however at this time I think she can be managed by primary care.   RETURN TO PRIMARY CARE RETURN TO PRIMARY CARE - Continue Gabapentin -May consider daily alpha lipoic acid which is an antioxidant that may reduce free radical oxidative stress associated with diabetic polyneuropathy, existing evidence suggests that alpha lipoic acid significantly reduces stabbing, lancinating and burning pain and diabetic neuropathy with its onset of action as early as 1-2 weeks. 600mg  daily and then up to twice daily. - Follow with Dr. Kathyrn Lass for further treatment - Mother had dementia, she has no symptoms, if she is concerned she can see Kathyrn Lass and she canorder - Dr Kathyrn Lass can send you for formal neurocognitive testing for a baseline if concerned but she has no symptoms of neurocognitive disorder - Read The "XX Brain"    [facility] [address] [place], [place] [address]  Phone [phone] Fax [phone]  I spent 30 minutes of face-to-face and non-face-to-face time with patient on the  1. Peripheral polyneuropathy   2. screen for B12 deficiency   3. screen for Vitamin B1 deficiency    diagnosis.  This included previsit chart review, lab review, study review, order entry, electronic  health record documentation, patient education on the different diagnostic and therapeutic options, counseling and coordination of care, risks and benefits of management, compliance, or risk factor reduction

## 2021-10-09 ENCOUNTER — Inpatient Hospital Stay: Payer: Medicare PPO

## 2021-10-11 ENCOUNTER — Inpatient Hospital Stay: Payer: Medicare PPO | Admitting: Nurse Practitioner

## 2021-10-17 LAB — MULTIPLE MYELOMA PANEL, SERUM
Albumin SerPl Elph-Mcnc: 4.1 g/dL (ref 2.9–4.4)
Albumin/Glob SerPl: 1.6 (ref 0.7–1.7)
Alpha 1: 0.2 g/dL (ref 0.0–0.4)
Alpha2 Glob SerPl Elph-Mcnc: 0.6 g/dL (ref 0.4–1.0)
B-Globulin SerPl Elph-Mcnc: 0.9 g/dL (ref 0.7–1.3)
Gamma Glob SerPl Elph-Mcnc: 1 g/dL (ref 0.4–1.8)
Globulin, Total: 2.6 g/dL (ref 2.2–3.9)
IgA/Immunoglobulin A, Serum: 126 mg/dL (ref 64–422)
IgG (Immunoglobin G), Serum: 901 mg/dL (ref 586–1602)
IgM (Immunoglobulin M), Srm: 134 mg/dL (ref 26–217)
Total Protein: 6.7 g/dL (ref 6.0–8.5)

## 2021-10-17 LAB — HEAVY METALS, BLOOD
Arsenic: 3 ug/L (ref 0–9)
Lead, Blood: <1 ug/dL (ref 0.0–3.4)
Mercury: <1 ug/L (ref 0.0–14.9)

## 2021-10-17 LAB — METHYLMALONIC ACID, SERUM: Methylmalonic Acid: 141 nmol/L (ref 0–378)

## 2021-10-17 LAB — VITAMIN B6: Vitamin B6: 36.9 ug/L (ref 3.4–65.2)

## 2021-10-17 LAB — B12 AND FOLATE PANEL
Folate: 18.8 ng/mL (ref >3.0)
Vitamin B-12: 577 pg/mL (ref 232–1245)

## 2021-10-17 LAB — VITAMIN B1: Thiamine: 190.7 nmol/L (ref 66.5–200.0)

## 2021-10-18 DIAGNOSIS — F5104 Psychophysiologic insomnia: Secondary | ICD-10-CM | POA: Diagnosis not present

## 2021-10-18 DIAGNOSIS — G4733 Obstructive sleep apnea (adult) (pediatric): Secondary | ICD-10-CM | POA: Diagnosis not present

## 2021-10-23 ENCOUNTER — Other Ambulatory Visit: Payer: Medicare PPO

## 2021-10-31 ENCOUNTER — Other Ambulatory Visit: Payer: Medicare PPO

## 2021-11-02 ENCOUNTER — Ambulatory Visit: Payer: Medicare PPO | Admitting: Nurse Practitioner

## 2021-11-08 ENCOUNTER — Encounter (HOSPITAL_COMMUNITY): Payer: Self-pay | Admitting: Gastroenterology

## 2021-11-13 ENCOUNTER — Inpatient Hospital Stay: Payer: Medicare PPO

## 2021-11-14 ENCOUNTER — Inpatient Hospital Stay: Payer: Medicare PPO | Attending: Nurse Practitioner

## 2021-11-14 ENCOUNTER — Other Ambulatory Visit: Payer: Self-pay

## 2021-11-14 ENCOUNTER — Other Ambulatory Visit: Payer: Medicare PPO

## 2021-11-14 DIAGNOSIS — K862 Cyst of pancreas: Secondary | ICD-10-CM | POA: Insufficient documentation

## 2021-11-14 DIAGNOSIS — R978 Other abnormal tumor markers: Secondary | ICD-10-CM | POA: Insufficient documentation

## 2021-11-14 DIAGNOSIS — Z800 Family history of malignant neoplasm of digestive organs: Secondary | ICD-10-CM

## 2021-11-15 LAB — CANCER ANTIGEN 19-9: CA 19-9: 9 U/mL (ref 0–35)

## 2021-11-16 ENCOUNTER — Encounter (HOSPITAL_COMMUNITY): Payer: Self-pay | Admitting: Gastroenterology

## 2021-11-16 ENCOUNTER — Ambulatory Visit (HOSPITAL_COMMUNITY): Payer: Medicare PPO | Admitting: Anesthesiology

## 2021-11-16 ENCOUNTER — Encounter (HOSPITAL_COMMUNITY)
Admission: RE | Disposition: A | Discharge status: Home / Self Care | Payer: Self-pay | Source: Home / Self Care | Attending: Gastroenterology

## 2021-11-16 ENCOUNTER — Ambulatory Visit (HOSPITAL_BASED_OUTPATIENT_CLINIC_OR_DEPARTMENT_OTHER): Payer: Medicare PPO | Admitting: Anesthesiology

## 2021-11-16 ENCOUNTER — Other Ambulatory Visit: Payer: Self-pay

## 2021-11-16 ENCOUNTER — Ambulatory Visit (HOSPITAL_COMMUNITY)
Admission: RE | Admit: 2021-11-16 | Discharge: 2021-11-16 | Disposition: A | Discharge status: Home / Self Care | Payer: Medicare PPO | Attending: Gastroenterology | Admitting: Gastroenterology

## 2021-11-16 DIAGNOSIS — E119 Type 2 diabetes mellitus without complications: Secondary | ICD-10-CM | POA: Insufficient documentation

## 2021-11-16 DIAGNOSIS — K862 Cyst of pancreas: Secondary | ICD-10-CM | POA: Diagnosis not present

## 2021-11-16 DIAGNOSIS — E039 Hypothyroidism, unspecified: Secondary | ICD-10-CM | POA: Insufficient documentation

## 2021-11-16 DIAGNOSIS — Z87891 Personal history of nicotine dependence: Secondary | ICD-10-CM

## 2021-11-16 DIAGNOSIS — Z800 Family history of malignant neoplasm of digestive organs: Secondary | ICD-10-CM | POA: Diagnosis not present

## 2021-11-16 DIAGNOSIS — I10 Essential (primary) hypertension: Secondary | ICD-10-CM | POA: Insufficient documentation

## 2021-11-16 DIAGNOSIS — Z7984 Long term (current) use of oral hypoglycemic drugs: Secondary | ICD-10-CM | POA: Diagnosis not present

## 2021-11-16 HISTORY — PX: ESOPHAGOGASTRODUODENOSCOPY: SHX5428

## 2021-11-16 HISTORY — PX: EUS: SHX5427

## 2021-11-16 LAB — GLUCOSE, CAPILLARY: Glucose-Capillary: 143 mg/dL — ABNORMAL HIGH (ref 70–99)

## 2021-11-16 SURGERY — UPPER ENDOSCOPIC ULTRASOUND (EUS) RADIAL
Anesthesia: Monitor Anesthesia Care

## 2021-11-16 MED ORDER — LACTATED RINGERS IV SOLN
INTRAVENOUS | Status: AC | PRN
  Administered 2021-11-16: 10 mL/h via INTRAVENOUS

## 2021-11-16 MED ORDER — PROPOFOL 10 MG/ML IV BOLUS
INTRAVENOUS | Status: DC | PRN
Start: 2021-11-16 — End: 2021-11-16
  Administered 2021-11-16: 30 mg via INTRAVENOUS
  Administered 2021-11-16: 40 mg via INTRAVENOUS

## 2021-11-16 MED ORDER — SODIUM CHLORIDE 0.9 % IV SOLN: INTRAVENOUS | Status: DC

## 2021-11-16 MED ORDER — LIDOCAINE 2% (20 MG/ML) 5 ML SYRINGE
INTRAMUSCULAR | Status: DC | PRN
  Administered 2021-11-16: 60 mg via INTRAVENOUS

## 2021-11-16 MED ORDER — PROPOFOL 500 MG/50ML IV EMUL
INTRAVENOUS | Status: DC | PRN
  Administered 2021-11-16: 100 ug/kg/min via INTRAVENOUS

## 2021-11-16 NOTE — Anesthesia Preprocedure Evaluation (Signed)
Anesthesia Evaluation  ?Patient identified by MRN, date of birth, ID band ?Patient awake ? ? ? ?Reviewed: ?Allergy & Precautions, NPO status , Patient's Chart, lab work & pertinent test results ? ?Airway ?Mallampati: II ? ?TM Distance: >3 FB ?Neck ROM: Full ? ? ? Dental ?no notable dental hx. ? ?  ?Pulmonary ?sleep apnea , former smoker,  ?  ?Pulmonary exam normal ?breath sounds clear to auscultation ? ? ? ? ? ? Cardiovascular ?hypertension, Pt. on medications and Pt. on home beta blockers ?Normal cardiovascular exam ?Rhythm:Regular Rate:Normal ? ? ?  ?Neuro/Psych ?negative neurological ROS ? negative psych ROS  ? GI/Hepatic ?negative GI ROS, Neg liver ROS,   ?Endo/Other  ?diabetes, Type 2Hypothyroidism  ? Renal/GU ?negative Renal ROS  ?negative genitourinary ?  ?Musculoskeletal ?negative musculoskeletal ROS ?(+)  ? Abdominal ?  ?Peds ?negative pediatric ROS ?(+)  Hematology ?negative hematology ROS ?(+)   ?Anesthesia Other Findings ? ? Reproductive/Obstetrics ?negative OB ROS ? ?  ? ? ? ? ? ? ? ? ? ? ? ? ? ?  ?  ? ? ? ? ? ? ? ? ?Anesthesia Physical ?Anesthesia Plan ? ?ASA: 2 ? ?Anesthesia Plan: MAC  ? ?Post-op Pain Management: Minimal or no pain anticipated  ? ?Induction: Intravenous ? ?PONV Risk Score and Plan: 2 and Propofol infusion and Treatment may vary due to age or medical condition ? ?Airway Management Planned: Simple Face Mask ? ?Additional Equipment:  ? ?Intra-op Plan:  ? ?Post-operative Plan:  ? ?Informed Consent: I have reviewed the patients History and Physical, chart, labs and discussed the procedure including the risks, benefits and alternatives for the proposed anesthesia with the patient or authorized representative who has indicated his/her understanding and acceptance.  ? ? ? ?Dental advisory given ? ?Plan Discussed with: CRNA and Surgeon ? ?Anesthesia Plan Comments:   ? ? ? ? ? ? ?Anesthesia Quick Evaluation ? ?

## 2021-11-16 NOTE — H&P (Signed)
HPI: ?This is a woman with  Incidental pancreatic cyst, also brother had pancreatic cancer.  MRI 2019 "pancreatic tail cystic lesion measures 1.1 x 0.5 x 0.8 cm" previous bili this measures 0.9 x 0.5 x 0.8 cm and 2018.  Endoscopic ultrasound March 2020 pancreatic tail cystic lesion that was not an obvious communication with the main pancreatic duct, cyst measured 10 mm.  No associated mass.  MRI March 2021 "stable 10 mm cystic lesion in pancreatic tail".  MRI March 2022 "scattered tiny pancreatic cysts measuring up to 7 mm, benign.  Dominant lesion is grossly unchanged dating back to 2018.  Given the small size it is unclear that additional follow-up is required.  However if desired follow-up MRI could be considered in 2 years" ? ?Genetic counseling OV March 2018, negative genetic testing labs.  Statement in their note which read "while there is no recommended pancreatic screening at this time, she could discuss options further with her providers" ? ?Here for screening EUS of the pancreas ? ? ?ROS: complete GI ROS as described in HPI, all other review negative. ? ?Constitutional:  No unintentional weight loss ? ? ?Past Medical History:  ?Diagnosis Date  ? Anxiety   ? Chest pressure   ? Degenerative arthritis   ? Deviated septum   ? Diabetes mellitus without complication ([place])   ? diet controlled  ? Diabetic neuropathy ([place]) 12/03/2017  ? DJD (degenerative joint disease) of knee   ? Family history of bladder cancer   ? Family history of brain cancer   ? Family history of breast cancer   ? Family history of ovarian cancer   ? Family history of prostate cancer   ? Family history of stomach cancer   ? Family history of throat cancer   ? Family history of uterine cancer   ? Graves disease   ? RADIOACTIVE IODINE 1983  ? H/O hiatal hernia   ? Heart palpitations   ? History of diastolic dysfunction   ? per echo in 2011  ? Hyperlipidemia   ? Hypertension   ? Hypothyroidism   ? LVH (left ventricular hypertrophy)   ? per echo  in 2011  ? Mitral valve prolapse   ? Neuropathy, peripheral   ? DR. Jannifer Franklin 12/15  ? Normal nuclear stress test 2011  ? OSA (obstructive sleep apnea)   ? Polyneuropathy in other diseases classified elsewhere ([place]) 05/21/2013  ? Rosacea 2019  ? MELANOMA CHEST REMOVED IN SITU  ? Sleep difficulties   ? Tinnitus   ? ? ?Past Surgical History:  ?Procedure Laterality Date  ? ABDOMINAL HYSTERECTOMY  2000  ? TOTAL  ? APPENDECTOMY  1989  ? CARDIOVASCULAR STRESS TEST  11/09/2009  ? EF 75%  ? CARPAL TUNNEL RELEASE  2002  ? bilateral  ? COLONOSCOPY WITH PROPOFOL N/A 10/23/2018  ? Procedure: COLONOSCOPY WITH PROPOFOL;  Surgeon: [name], MD;  [facility];  Service: Endoscopy;  Laterality: N/A;  ? ESOPHAGOGASTRODUODENOSCOPY (EGD) WITH PROPOFOL N/A 10/23/2018  ? Procedure: ESOPHAGOGASTRODUODENOSCOPY (EGD) WITH PROPOFOL;  Surgeon: [name], MD;  [facility];  Service: Endoscopy;  Laterality: N/A;  ? EUS N/A 10/23/2018  ? Procedure: UPPER ENDOSCOPIC ULTRASOUND (EUS) RADIAL;  Surgeon: [name], MD;  [facility];  Service: Endoscopy;  Laterality: N/A;  ? MASS EXCISION Left 06/02/2019  ? Procedure: EXCISION OF SUBCUTANEOUS ABDOMINAL WALL MASSES LEFT LOWER QUADRANT;  Surgeon: [name], MD;  Location: [place];  Service: General;  Laterality: Left;  ?  TONSILLECTOMY  1958  ? TRANSTHORACIC ECHOCARDIOGRAM  11/01/2009  ? NORMAL LV FILLING AND DIASTOLIC DYSFUNCTION AND MILD AORTIC SCLEROSIS AND TRACE MITRAL REGURGITATION  ? ? ?Current Outpatient Medications  ?Medication Instructions  ? acetaminophen (TYLENOL) 1,000 mg, Oral, Every 8 hours PRN  ? ALPRAZolam (XANAX) 0.25 mg, Oral, Daily PRN  ? fluticasone (FLONASE) 50 MCG/ACT nasal spray 2 sprays, Each Nare, Daily at bedtime  ? furosemide (LASIX) 10 mg, Oral, Daily  ? gabapentin (NEURONTIN) 300 mg, Oral, Daily at bedtime  ? gabapentin (NEURONTIN) 100 mg, Oral, 3 times daily, PRN   ? levothyroxine (SYNTHROID) 100 mcg, Oral, Daily before  breakfast  ? metFORMIN (GLUCOPHAGE) 500 mg, Oral, Daily with lunch  ? metoprolol succinate (TOPROL-XL) 25 MG 24 hr tablet TAKE 1/2 TABLET BY MOUTH DAILY.  ? metroNIDAZOLE (METROCREAM) 4.50 % cream 1 application., Topical, Daily  ? potassium chloride SA (K-DUR,KLOR-CON) 20 MEQ tablet TAKE 1/2 TABLET BY MOUTH DAILY.  ? Prenatal Vit-Fe Fumarate-FA (PRENATAL MULTIVITAMIN) TABS tablet 1 tablet, Oral, Daily  ? rosuvastatin (CRESTOR) 5 mg, Oral, Daily  ? Vitamin D 2,000 Units, Oral, Daily  ? ? ?Allergies as of 09/27/2021 - Review Complete 07/25/2021  ?Allergen Reaction Noted  ? Tobramycin Hives 01/31/2011  ? Sulfa antibiotics  10/12/2020  ? Elemental sulfur Nausea Only 06/20/2017  ? Erythromycin Nausea Only 12/28/2011  ? ? ?Family History  ?Problem Relation Age of Onset  ? Hypertension Mother   ? Heart disease Mother   ? Dementia Mother   ? Heart disease Father   ? Arthritis Father   ? Diabetes Father   ? Hypertension Father   ? Pancreatic cancer Brother 61  ?     pancreatic  ? Other Brother   ?     automobile accident  ? Throat cancer Maternal Uncle   ? Brain cancer Paternal Aunt 63  ?     malignant brain cancer  ? Stroke Maternal Grandmother   ? Heart disease Maternal Grandfather   ? Pancreatic cancer Cousin 42  ?     pancreatic (maternal first cousin)  ? Pancreatic cancer Cousin 29  ?     pancreatic, smoker (maternal first cousin)  ? Breast cancer Cousin 51  ?     breast (maternal first cousin)  ? Ovarian cancer Cousin   ? Uterine cancer Cousin 66  ?     uterine (paternal first cousin)  ? Cancer Cousin 51  ?     unknown primary (paternal first cousin)  ? Bladder Cancer Cousin 81  ?     (paternal first cousin)  ? Lung cancer Cousin   ? Throat cancer Cousin   ? Prostate cancer Cousin   ?     (maternal first cousin)  ? Stomach cancer Cousin 45  ?     (maternal first cousin)  ? Cancer Other 85  ?     pancreatic neuroendocrine carcinoma  ? Colon cancer Neg Hx   ? Neuropathy Neg Hx   ? ? ?Social History  ? ?Socioeconomic  History  ? Marital status: Married  ?  Spouse name: Edd Fabian  ? Number of children: 1  ? Years of education: COLLEGE  ? Highest education level: Not on file  ?Occupational History  ? Occupation: Retired  ?Tobacco Use  ? Smoking status: Former  ?  Packs/day: 0.50  ?  Years: 10.00  ?  Pack years: 5.00  ?  Types: Cigarettes  ?  Quit date: 08/14/1975  ?  Years since quitting:  46.2  ? Smokeless tobacco: Never  ?Vaping Use  ? Vaping Use: Never used  ?Substance and Sexual Activity  ? Alcohol use: Yes  ?  Comment: one glass of wine a week  ? Drug use: No  ? Sexual activity: Not on file  ?Other Topics Concern  ? Not on file  ?Social History Narrative  ? Lives at home w/ her husband, Edd Fabian.  ? Right-handed.  ? Drinks no caffeine but does eat chocolate occasionally.  ? epworth sleepiness scale = 3 (01/10/16)  ? ?Social Determinants of Health  ? ?Financial Resource Strain: Not on file  ?Food Insecurity: Not on file  ?Transportation Needs: Not on file  ?Physical Activity: Not on file  ?Stress: Not on file  ?Social Connections: Not on file  ?Intimate Partner Violence: Not on file  ? ? ? ?Physical Exam: ? ?Constitutional: generally well-appearing ?Psychiatric: alert and oriented x3 ?Abdomen: soft, nontender, nondistended, no obvious ascites, no peritoneal signs, normal bowel sounds ?No peripheral edema noted in lower extremities ? ?Assessment and plan: ?76 y.o. female with incidental panc cyst and fh of pancreatic cacncer ? ?EUS evaluation of the pancreas today ? ?Please see the "Patient Instructions" section for addition details about the plan. ? ?Owens Loffler, MD ?[facility] ?11/16/2021, 10:08 AM ? ?

## 2021-11-16 NOTE — Anesthesia Postprocedure Evaluation (Signed)
Anesthesia Post Note ? ?Patient: Yvonne [name] ? ?Procedure(s) Performed: UPPER ENDOSCOPIC ULTRASOUND (EUS) RADIAL ?ESOPHAGOGASTRODUODENOSCOPY (EGD) ? ?  ? ?Patient location during evaluation: PACU ?Anesthesia Type: MAC ?Level of consciousness: awake and alert ?Pain management: pain level controlled ?Vital Signs Assessment: post-procedure vital signs reviewed and stable ?Respiratory status: spontaneous breathing, nonlabored ventilation, respiratory function stable and patient connected to nasal cannula oxygen ?Cardiovascular status: stable and blood pressure returned to baseline ?Postop Assessment: no apparent nausea or vomiting ?Anesthetic complications: no ? ? ?No notable events documented. ? ?Last Vitals:  ?Vitals:  ? 11/16/21 1150 11/16/21 1151  ?BP:  (!) 174/76  ?Pulse: 66 65  ?Resp: 19 15  ?Temp:    ?SpO2: 100% 100%  ?  ?Last Pain:  ?Vitals:  ? 11/16/21 1120  ?TempSrc: Oral  ?PainSc:   ? ? ?  ?  ?  ?  ?  ?  ? ?Ryane Canavan S ? ? ? ? ?

## 2021-11-16 NOTE — Transfer of Care (Signed)
Immediate Anesthesia Transfer of Care Note ? ?Patient: Yvonne [name] ? ?Procedure(s) Performed: UPPER ENDOSCOPIC ULTRASOUND (EUS) RADIAL ?ESOPHAGOGASTRODUODENOSCOPY (EGD) ? ?Patient Location: Endoscopy Unit ? ?Anesthesia Type:MAC ? ?Level of Consciousness: drowsy and patient cooperative ? ?Airway & Oxygen Therapy: Patient Spontanous Breathing and Patient connected to face mask oxygen ? ?Post-op Assessment: Report given to RN and Post -op Vital signs reviewed and stable ? ?Post vital signs: Reviewed and stable ? ?Last Vitals:  ?Vitals Value Taken Time  ?BP 140/51 11/16/21 1120  ?Temp    ?Pulse 58 11/16/21 1121  ?Resp 17 11/16/21 1121  ?SpO2 100 % 11/16/21 1121  ?Vitals shown include unvalidated device data. ? ?Last Pain:  ?Vitals:  ? 11/16/21 1013  ?TempSrc: Oral  ?PainSc: 0-No pain  ?   ? ?  ? ?Complications: No notable events documented. ?

## 2021-11-16 NOTE — Op Note (Signed)
[facility] ?Patient Name: Yvonne [name] ?Procedure Date: 11/16/2021 ?MRN: [id number] ?Attending MD: [name] , MD ?Date of Birth: 08-Aug-1946 ?CSN: [id number] ?Age: 76 ?Admit Type: Outpatient ?Procedure:                Upper EUS ?Indications:              Pancreatic cyst on CT scan; Incidental pancreatic  ?                          cyst, also brother had pancreatic cancer. ?MRI  ?                          2019?"pancreatic tail cystic lesion measures 1.1 x  ?                          0.5 x 0.8 cm" previous bili this measures 0.9 x 0.5  ?                          x 0.8 cm and 2018. ?Endoscopic ultrasound March  ?                          2020?pancreatic tail cystic lesion that was not an  ?                          obvious communication with the main pancreatic  ?                          duct, cyst measured 10 mm. ?No associated mass.  ?                          ?MRI March 2021?"stable 10 mm cystic lesion in  ?                          pancreatic tail". ?MRI March 2022 "scattered tiny  ?                          pancreatic cysts measuring up to 7 mm, benign.  ?                          ?Dominant lesion is grossly unchanged dating back  ?                          to 2018. ?Given the small size it is unclear that  ?                          additional follow-up is required. ?However if  ?                          desired follow-up MRI could be considered in 2  ?                          years". Genetic counseling?OV?March 2018, negative  ?  genetic testing labs. ?Statement in their note  ?                          which read "while there is no recommended  ?                          pancreatic screening at this time, she could  ?                          discuss options further with her providers" ?Providers:                [name], MD, Lurline Del, RN, Charlean Merl  ?                          Purcell Nails, Technician, Cleda Daub, CRNA ?Referring MD:               ?Medicines:                Monitored Anesthesia Care ?Complications:            No immediate complications. Estimated blood loss:  ?                          None. ?Estimated Blood Loss:     Estimated blood loss: none. ?Procedure:                Pre-Anesthesia Assessment: ?                          - Prior to the procedure, a History and Physical  ?                          was performed, and patient medications and  ?                          allergies were reviewed. The patient's tolerance of  ?                          previous anesthesia was also reviewed. The risks  ?                          and benefits of the procedure and the sedation  ?                          options and risks were discussed with the patient.  ?                          All questions were answered, and informed consent  ?                          was obtained. Prior Anticoagulants: The patient has  ?                          taken no previous anticoagulant or antiplatelet  ?  agents. ASA Grade Assessment: II - A patient with  ?                          mild systemic disease. After reviewing the risks  ?                          and benefits, the patient was deemed in  ?                          satisfactory condition to undergo the procedure. ?                          After obtaining informed consent, the endoscope was  ?                          passed under direct vision. Throughout the  ?                          procedure, the patient's blood pressure, pulse, and  ?                          oxygen saturations were monitored continuously. The  ?                          [id number]-[id number] ([id number]) Olympus radial ultrasound  ?                          scope was introduced through the mouth, and  ?                          advanced to the second part of duodenum. The upper  ?                          EUS was accomplished without difficulty. The  ?                          patient tolerated the procedure well. ?Scope  In: ?Scope Out: ?Findings: ?     ENDOSCOPIC FINDING: : ?     The examined esophagus was endoscopically normal. ?     The entire examined stomach was endoscopically normal. ?     The examined duodenum was endoscopically normal. ?     ENDOSONOGRAPHIC FINDING: : ?     1. Multiple small cysts throughout the pancreas (head to tail). The  ?     largest was 32m. None of the cysts have concerning morphologic features  ?     and there are none in obvious communication with the main pancreatic  ?     duct. ?     2. Pancreatic parenchyma was otherwise normal. No solid masses, no signs  ?     of chronic pancreatitis. ?     3. Limited views of the liver, spleen, gallbladder, portal and splenic  ?     vessels were all normal. ?Impression:               - Multiple small cysts throughout the pancreas  ?                          (  head to tail). The largest was 56m. Essentially  ?                          unchanged since 2018None of the cysts have  ?                          concerning morphologic features and there are none  ?                          in obvious communication with the main pancreatic  ?                          duct. ?Moderate Sedation: ?     Not Applicable - Patient had care per Anesthesia. ?Recommendation:           - Continue pancreatic cancer screening with  ?                          MRI/MRCP in 1 year, [facility] will coordinate. ?Procedure Code(s):        --- Professional --- ?                          [phone] Esophagogastroduodenoscopy, flexible,  ?                          transoral; with endoscopic ultrasound examination  ?                          limited to the esophagus, stomach or duodenum, and  ?                          adjacent structures ?Diagnosis Code(s):        --- Professional --- ?                          K86.2, Cyst of pancreas ?CPT copyright 2019 American Medical Association. All rights reserved. ?The codes documented in this report are preliminary and upon coder review may  ?be revised to  meet current compliance requirements. ?DMilus Banister MD ?11/16/2021 11:29:19 AM ?This report has been signed electronically. ?Number of Addenda: 0 ?

## 2021-11-16 NOTE — Discharge Instructions (Signed)
YOU HAD AN ENDOSCOPIC PROCEDURE TODAY: Refer to the procedure report and other information in the discharge instructions given to you for any specific questions about what was found during the examination. If this information does not answer your questions, please call [place] office at [phone] to clarify.  ° °YOU SHOULD EXPECT: Some feelings of bloating in the abdomen. Passage of more gas than usual. Walking can help get rid of the air that was put into your GI tract during the procedure and reduce the bloating. If you had a lower endoscopy (such as a colonoscopy or flexible sigmoidoscopy) you may notice spotting of blood in your stool or on the toilet paper. Some abdominal soreness may be present for a day or two, also. ° °DIET: Your first meal following the procedure should be a light meal and then it is ok to progress to your normal diet. A half-sandwich or bowl of soup is an example of a good first meal. Heavy or fried foods are harder to digest and may make you feel nauseous or bloated. Drink plenty of fluids but you should avoid alcoholic beverages for 24 hours. If you had a esophageal dilation, please see attached instructions for diet.   ° °ACTIVITY: Your care partner should take you home directly after the procedure. You should plan to take it easy, moving slowly for the rest of the day. You can resume normal activity the day after the procedure however YOU SHOULD NOT DRIVE, use power tools, machinery or perform tasks that involve climbing or major physical exertion for 24 hours (because of the sedation medicines used during the test).  ° °SYMPTOMS TO REPORT IMMEDIATELY: °A gastroenterologist can be reached at any hour. Please call [phone]  for any of the following symptoms:  °Following lower endoscopy (colonoscopy, flexible sigmoidoscopy) °Excessive amounts of blood in the stool  °Significant tenderness, worsening of abdominal pains  °Swelling of the abdomen that is new, acute  °Fever of 100° or  higher  °Following upper endoscopy (EGD, EUS, ERCP, esophageal dilation) °Vomiting of blood or coffee ground material  °New, significant abdominal pain  °New, significant chest pain or pain under the shoulder blades  °Painful or persistently difficult swallowing  °New shortness of breath  °Black, tarry-looking or red, bloody stools ° °FOLLOW UP:  °If any biopsies were taken you will be contacted by phone or by letter within the next 1-3 weeks. Call [phone]  if you have not heard about the biopsies in 3 weeks.  °Please also call with any specific questions about appointments or follow up tests. ° °

## 2021-11-17 ENCOUNTER — Encounter (HOSPITAL_COMMUNITY): Payer: Self-pay | Admitting: Gastroenterology

## 2021-11-22 ENCOUNTER — Telehealth: Payer: Self-pay | Admitting: Neurology

## 2021-11-22 NOTE — Telephone Encounter (Signed)
Pt states she has done research on the Alpha Lipoic Acid and read that she may not want to take if she is on medication for Thyroid(which she is) and or High Blood Pressure(which she is) pt wants to know if she should still take the Alpha Lipoic Acid ?

## 2021-11-24 DIAGNOSIS — M1712 Unilateral primary osteoarthritis, left knee: Secondary | ICD-10-CM | POA: Insufficient documentation

## 2021-11-24 DIAGNOSIS — M1711 Unilateral primary osteoarthritis, right knee: Secondary | ICD-10-CM | POA: Insufficient documentation

## 2021-12-05 ENCOUNTER — Telehealth: Payer: Self-pay | Admitting: Hematology

## 2021-12-05 NOTE — Telephone Encounter (Signed)
Per 4/24 called pt to schedule appointment.  Pt confirmed appointment  ?

## 2021-12-18 DIAGNOSIS — E785 Hyperlipidemia, unspecified: Secondary | ICD-10-CM | POA: Diagnosis not present

## 2021-12-18 DIAGNOSIS — Z6825 Body mass index (BMI) 25.0-25.9, adult: Secondary | ICD-10-CM | POA: Diagnosis not present

## 2021-12-18 DIAGNOSIS — E1140 Type 2 diabetes mellitus with diabetic neuropathy, unspecified: Secondary | ICD-10-CM | POA: Diagnosis not present

## 2022-01-11 DIAGNOSIS — M170 Bilateral primary osteoarthritis of knee: Secondary | ICD-10-CM | POA: Diagnosis not present

## 2022-01-18 DIAGNOSIS — M170 Bilateral primary osteoarthritis of knee: Secondary | ICD-10-CM | POA: Diagnosis not present

## 2022-01-24 DIAGNOSIS — G4733 Obstructive sleep apnea (adult) (pediatric): Secondary | ICD-10-CM | POA: Diagnosis not present

## 2022-01-24 DIAGNOSIS — E039 Hypothyroidism, unspecified: Secondary | ICD-10-CM | POA: Diagnosis not present

## 2022-01-24 DIAGNOSIS — F411 Generalized anxiety disorder: Secondary | ICD-10-CM | POA: Diagnosis not present

## 2022-01-24 DIAGNOSIS — G4700 Insomnia, unspecified: Secondary | ICD-10-CM | POA: Diagnosis not present

## 2022-01-24 DIAGNOSIS — G629 Polyneuropathy, unspecified: Secondary | ICD-10-CM | POA: Diagnosis not present

## 2022-01-25 DIAGNOSIS — M170 Bilateral primary osteoarthritis of knee: Secondary | ICD-10-CM | POA: Diagnosis not present

## 2022-03-08 DIAGNOSIS — Z0000 Encounter for general adult medical examination without abnormal findings: Secondary | ICD-10-CM | POA: Diagnosis not present

## 2022-03-08 DIAGNOSIS — Z1389 Encounter for screening for other disorder: Secondary | ICD-10-CM | POA: Diagnosis not present

## 2022-03-16 DIAGNOSIS — I10 Essential (primary) hypertension: Secondary | ICD-10-CM | POA: Diagnosis not present

## 2022-03-16 DIAGNOSIS — E1140 Type 2 diabetes mellitus with diabetic neuropathy, unspecified: Secondary | ICD-10-CM | POA: Diagnosis not present

## 2022-03-16 DIAGNOSIS — E039 Hypothyroidism, unspecified: Secondary | ICD-10-CM | POA: Diagnosis not present

## 2022-03-19 DIAGNOSIS — L820 Inflamed seborrheic keratosis: Secondary | ICD-10-CM | POA: Diagnosis not present

## 2022-03-19 DIAGNOSIS — L821 Other seborrheic keratosis: Secondary | ICD-10-CM | POA: Diagnosis not present

## 2022-03-19 DIAGNOSIS — D225 Melanocytic nevi of trunk: Secondary | ICD-10-CM | POA: Diagnosis not present

## 2022-03-19 DIAGNOSIS — D1801 Hemangioma of skin and subcutaneous tissue: Secondary | ICD-10-CM | POA: Diagnosis not present

## 2022-03-19 DIAGNOSIS — D2261 Melanocytic nevi of right upper limb, including shoulder: Secondary | ICD-10-CM | POA: Diagnosis not present

## 2022-03-19 DIAGNOSIS — Z85820 Personal history of malignant melanoma of skin: Secondary | ICD-10-CM | POA: Diagnosis not present

## 2022-03-19 DIAGNOSIS — D2262 Melanocytic nevi of left upper limb, including shoulder: Secondary | ICD-10-CM | POA: Diagnosis not present

## 2022-03-19 DIAGNOSIS — L720 Epidermal cyst: Secondary | ICD-10-CM | POA: Diagnosis not present

## 2022-03-20 DIAGNOSIS — E039 Hypothyroidism, unspecified: Secondary | ICD-10-CM | POA: Diagnosis not present

## 2022-03-20 DIAGNOSIS — E1140 Type 2 diabetes mellitus with diabetic neuropathy, unspecified: Secondary | ICD-10-CM | POA: Diagnosis not present

## 2022-04-04 ENCOUNTER — Emergency Department (HOSPITAL_COMMUNITY): Payer: Medicare PPO

## 2022-04-04 ENCOUNTER — Inpatient Hospital Stay (HOSPITAL_COMMUNITY)
Admission: EM | Admit: 2022-04-04 | Discharge: 2022-04-06 | DRG: 101 | Disposition: A | Discharge status: Home / Self Care | Payer: Medicare PPO | Attending: Neurology | Admitting: Neurology

## 2022-04-04 ENCOUNTER — Inpatient Hospital Stay (HOSPITAL_COMMUNITY): Payer: Medicare PPO

## 2022-04-04 DIAGNOSIS — Z87891 Personal history of nicotine dependence: Secondary | ICD-10-CM | POA: Diagnosis not present

## 2022-04-04 DIAGNOSIS — R569 Unspecified convulsions: Secondary | ICD-10-CM | POA: Diagnosis not present

## 2022-04-04 DIAGNOSIS — Z90710 Acquired absence of both cervix and uterus: Secondary | ICD-10-CM | POA: Diagnosis not present

## 2022-04-04 DIAGNOSIS — I10 Essential (primary) hypertension: Secondary | ICD-10-CM | POA: Diagnosis not present

## 2022-04-04 DIAGNOSIS — G4733 Obstructive sleep apnea (adult) (pediatric): Secondary | ICD-10-CM | POA: Diagnosis present

## 2022-04-04 DIAGNOSIS — D432 Neoplasm of uncertain behavior of brain, unspecified: Secondary | ICD-10-CM | POA: Diagnosis not present

## 2022-04-04 DIAGNOSIS — Z9049 Acquired absence of other specified parts of digestive tract: Secondary | ICD-10-CM | POA: Diagnosis not present

## 2022-04-04 DIAGNOSIS — R4701 Aphasia: Secondary | ICD-10-CM | POA: Diagnosis not present

## 2022-04-04 DIAGNOSIS — I700 Atherosclerosis of aorta: Secondary | ICD-10-CM | POA: Diagnosis not present

## 2022-04-04 DIAGNOSIS — E876 Hypokalemia: Secondary | ICD-10-CM | POA: Diagnosis present

## 2022-04-04 DIAGNOSIS — G629 Polyneuropathy, unspecified: Secondary | ICD-10-CM | POA: Diagnosis not present

## 2022-04-04 DIAGNOSIS — Z7984 Long term (current) use of oral hypoglycemic drugs: Secondary | ICD-10-CM | POA: Diagnosis not present

## 2022-04-04 DIAGNOSIS — R2990 Unspecified symptoms and signs involving the nervous system: Principal | ICD-10-CM

## 2022-04-04 DIAGNOSIS — I639 Cerebral infarction, unspecified: Secondary | ICD-10-CM | POA: Diagnosis not present

## 2022-04-04 DIAGNOSIS — Z85820 Personal history of malignant melanoma of skin: Secondary | ICD-10-CM

## 2022-04-04 DIAGNOSIS — N281 Cyst of kidney, acquired: Secondary | ICD-10-CM | POA: Diagnosis not present

## 2022-04-04 DIAGNOSIS — R29818 Other symptoms and signs involving the nervous system: Secondary | ICD-10-CM | POA: Diagnosis not present

## 2022-04-04 DIAGNOSIS — E1142 Type 2 diabetes mellitus with diabetic polyneuropathy: Secondary | ICD-10-CM | POA: Diagnosis not present

## 2022-04-04 DIAGNOSIS — F419 Anxiety disorder, unspecified: Secondary | ICD-10-CM | POA: Diagnosis present

## 2022-04-04 DIAGNOSIS — E039 Hypothyroidism, unspecified: Secondary | ICD-10-CM | POA: Diagnosis present

## 2022-04-04 DIAGNOSIS — R29701 NIHSS score 1: Secondary | ICD-10-CM | POA: Diagnosis present

## 2022-04-04 DIAGNOSIS — E785 Hyperlipidemia, unspecified: Secondary | ICD-10-CM | POA: Diagnosis present

## 2022-04-04 DIAGNOSIS — I161 Hypertensive emergency: Secondary | ICD-10-CM | POA: Diagnosis present

## 2022-04-04 DIAGNOSIS — Z79899 Other long term (current) drug therapy: Secondary | ICD-10-CM | POA: Diagnosis not present

## 2022-04-04 DIAGNOSIS — I341 Nonrheumatic mitral (valve) prolapse: Secondary | ICD-10-CM | POA: Diagnosis not present

## 2022-04-04 DIAGNOSIS — G4700 Insomnia, unspecified: Secondary | ICD-10-CM | POA: Diagnosis not present

## 2022-04-04 DIAGNOSIS — G939 Disorder of brain, unspecified: Secondary | ICD-10-CM | POA: Diagnosis not present

## 2022-04-04 DIAGNOSIS — Z79890 Hormone replacement therapy (postmenopausal): Secondary | ICD-10-CM

## 2022-04-04 DIAGNOSIS — Z8249 Family history of ischemic heart disease and other diseases of the circulatory system: Secondary | ICD-10-CM

## 2022-04-04 DIAGNOSIS — Z882 Allergy status to sulfonamides status: Secondary | ICD-10-CM

## 2022-04-04 DIAGNOSIS — Z808 Family history of malignant neoplasm of other organs or systems: Secondary | ICD-10-CM | POA: Diagnosis not present

## 2022-04-04 DIAGNOSIS — G40909 Epilepsy, unspecified, not intractable, without status epilepticus: Principal | ICD-10-CM | POA: Diagnosis present

## 2022-04-04 DIAGNOSIS — F411 Generalized anxiety disorder: Secondary | ICD-10-CM | POA: Diagnosis not present

## 2022-04-04 DIAGNOSIS — C719 Malignant neoplasm of brain, unspecified: Secondary | ICD-10-CM | POA: Diagnosis not present

## 2022-04-04 DIAGNOSIS — R000 Tachycardia, unspecified: Secondary | ICD-10-CM | POA: Diagnosis not present

## 2022-04-04 DIAGNOSIS — D496 Neoplasm of unspecified behavior of brain: Secondary | ICD-10-CM | POA: Diagnosis not present

## 2022-04-04 DIAGNOSIS — Z881 Allergy status to other antibiotic agents status: Secondary | ICD-10-CM

## 2022-04-04 DIAGNOSIS — I6389 Other cerebral infarction: Secondary | ICD-10-CM | POA: Diagnosis not present

## 2022-04-04 LAB — DIFFERENTIAL
Abs Immature Granulocytes: 0.01 10*3/uL (ref 0.00–0.07)
Basophils Absolute: 0 10*3/uL (ref 0.0–0.1)
Basophils Relative: 1 %
Eosinophils Absolute: 0.3 10*3/uL (ref 0.0–0.5)
Eosinophils Relative: 4 %
Immature Granulocytes: 0 %
Lymphocytes Relative: 31 %
Lymphs Abs: 2.3 10*3/uL (ref 0.7–4.0)
Monocytes Absolute: 0.7 10*3/uL (ref 0.1–1.0)
Monocytes Relative: 9 %
Neutro Abs: 4 10*3/uL (ref 1.7–7.7)
Neutrophils Relative %: 55 %

## 2022-04-04 LAB — COMPREHENSIVE METABOLIC PANEL
ALT: 19 U/L (ref 0–44)
AST: 22 U/L (ref 15–41)
Albumin: 3.4 g/dL — ABNORMAL LOW (ref 3.5–5.0)
Alkaline Phosphatase: 43 U/L (ref 38–126)
Anion gap: 7 (ref 5–15)
BUN: 10 mg/dL (ref 8–23)
CO2: 20 mmol/L — ABNORMAL LOW (ref 22–32)
Calcium: 7.3 mg/dL — ABNORMAL LOW (ref 8.9–10.3)
Chloride: 112 mmol/L — ABNORMAL HIGH (ref 98–111)
Creatinine, Ser: 0.49 mg/dL (ref 0.44–1.00)
GFR, Estimated: >60 mL/min (ref >60)
Glucose, Bld: 135 mg/dL — ABNORMAL HIGH (ref 70–99)
Potassium: 3 mmol/L — ABNORMAL LOW (ref 3.5–5.1)
Sodium: 139 mmol/L (ref 135–145)
Total Bilirubin: 0.5 mg/dL (ref 0.3–1.2)
Total Protein: 5.3 g/dL — ABNORMAL LOW (ref 6.5–8.1)

## 2022-04-04 LAB — APTT: aPTT: 34 seconds (ref 24–36)

## 2022-04-04 LAB — I-STAT CHEM 8, ED
BUN: 9 mg/dL (ref 8–23)
Calcium, Ion: 0.96 mmol/L — ABNORMAL LOW (ref 1.15–1.40)
Chloride: 109 mmol/L (ref 98–111)
Creatinine, Ser: 0.3 mg/dL — ABNORMAL LOW (ref 0.44–1.00)
Glucose, Bld: 126 mg/dL — ABNORMAL HIGH (ref 70–99)
HCT: 33 % — ABNORMAL LOW (ref 36.0–46.0)
Hemoglobin: 11.2 g/dL — ABNORMAL LOW (ref 12.0–15.0)
Potassium: 3 mmol/L — ABNORMAL LOW (ref 3.5–5.1)
Sodium: 144 mmol/L (ref 135–145)
TCO2: 22 mmol/L (ref 22–32)

## 2022-04-04 LAB — HEMOGLOBIN A1C
Hgb A1c MFr Bld: 6.5 % — ABNORMAL HIGH (ref 4.8–5.6)
Mean Plasma Glucose: 139.85 mg/dL

## 2022-04-04 LAB — GLUCOSE, CAPILLARY: Glucose-Capillary: 178 mg/dL — ABNORMAL HIGH (ref 70–99)

## 2022-04-04 LAB — CBC
HCT: 36.9 % (ref 36.0–46.0)
Hemoglobin: 12.8 g/dL (ref 12.0–15.0)
MCH: 28.4 pg (ref 26.0–34.0)
MCHC: 34.7 g/dL (ref 30.0–36.0)
MCV: 82 fL (ref 80.0–100.0)
Platelets: 168 10*3/uL (ref 150–400)
RBC: 4.5 MIL/uL (ref 3.87–5.11)
RDW: 13.1 % (ref 11.5–15.5)
WBC: 7.3 10*3/uL (ref 4.0–10.5)
nRBC: 0 % (ref 0.0–0.2)

## 2022-04-04 LAB — PROTIME-INR
INR: 1.1 (ref 0.8–1.2)
Prothrombin Time: 13.9 seconds (ref 11.4–15.2)

## 2022-04-04 LAB — MRSA NEXT GEN BY PCR, NASAL: MRSA by PCR Next Gen: NOT DETECTED

## 2022-04-04 LAB — ETHANOL: Alcohol, Ethyl (B): <10 mg/dL (ref <10)

## 2022-04-04 MED ORDER — CLEVIDIPINE BUTYRATE 0.5 MG/ML IV EMUL: 0.0000 mg/h | INTRAVENOUS | Status: DC

## 2022-04-04 MED ORDER — TENECTEPLASE FOR STROKE
0.2500 mg/kg | PACK | Freq: Once | INTRAVENOUS | Status: AC
Start: 2022-04-04 — End: 2022-04-04
  Administered 2022-04-04: 15 mg via INTRAVENOUS
  Filled 2022-04-04: qty 10

## 2022-04-04 MED ORDER — ACETAMINOPHEN 325 MG PO TABS
650.0000 mg | ORAL_TABLET | ORAL | Status: DC | PRN
  Administered 2022-04-04 – 2022-04-05 (×2): 650 mg via ORAL
  Filled 2022-04-04 (×2): qty 2

## 2022-04-04 MED ORDER — ACETAMINOPHEN 650 MG RE SUPP: 650.0000 mg | RECTAL | Status: DC | PRN

## 2022-04-04 MED ORDER — IOHEXOL 350 MG/ML SOLN
60.0000 mL | Freq: Once | INTRAVENOUS | Status: AC | PRN
  Administered 2022-04-04: 60 mL via INTRAVENOUS

## 2022-04-04 MED ORDER — ACETAMINOPHEN 160 MG/5ML PO SOLN: 650.0000 mg | ORAL | Status: DC | PRN

## 2022-04-04 MED ORDER — SODIUM CHLORIDE 0.9 % IV SOLN: INTRAVENOUS | Status: DC

## 2022-04-04 MED ORDER — LABETALOL HCL 5 MG/ML IV SOLN: 20.0000 mg | Freq: Once | INTRAVENOUS | Status: DC

## 2022-04-04 MED ORDER — POTASSIUM CHLORIDE CRYS ER 20 MEQ PO TBCR
40.0000 meq | EXTENDED_RELEASE_TABLET | Freq: Once | ORAL | Status: AC
  Administered 2022-04-04: 40 meq via ORAL
  Filled 2022-04-04: qty 2

## 2022-04-04 MED ORDER — CHLORHEXIDINE GLUCONATE CLOTH 2 % EX PADS: 6.0000 | MEDICATED_PAD | Freq: Every day | CUTANEOUS | Status: DC

## 2022-04-04 MED ORDER — ORAL CARE MOUTH RINSE: 15.0000 mL | OROMUCOSAL | Status: DC | PRN

## 2022-04-04 MED ORDER — PANTOPRAZOLE SODIUM 40 MG IV SOLR: 40.0000 mg | Freq: Every day | INTRAVENOUS | Status: DC

## 2022-04-04 MED ORDER — INSULIN ASPART 100 UNIT/ML IJ SOLN
0.0000 [IU] | Freq: Three times a day (TID) | INTRAMUSCULAR | Status: DC
  Administered 2022-04-05: 2 [IU] via SUBCUTANEOUS
  Administered 2022-04-06: 1 [IU] via SUBCUTANEOUS

## 2022-04-04 MED ORDER — POTASSIUM CHLORIDE 10 MEQ/100ML IV SOLN
10.0000 meq | INTRAVENOUS | Status: AC
  Administered 2022-04-04 (×2): 10 meq via INTRAVENOUS
  Filled 2022-04-04 (×4): qty 100

## 2022-04-04 MED ORDER — PANTOPRAZOLE SODIUM 40 MG PO TBEC
40.0000 mg | DELAYED_RELEASE_TABLET | Freq: Every day | ORAL | Status: DC
  Administered 2022-04-05: 40 mg via ORAL
  Filled 2022-04-04 (×2): qty 1

## 2022-04-04 MED ORDER — STROKE: EARLY STAGES OF RECOVERY BOOK: Freq: Once | Status: DC

## 2022-04-04 MED ORDER — SENNOSIDES-DOCUSATE SODIUM 8.6-50 MG PO TABS: 1.0000 | ORAL_TABLET | Freq: Every evening | ORAL | Status: DC | PRN

## 2022-04-04 MED ORDER — CLEVIDIPINE BUTYRATE 0.5 MG/ML IV EMUL
INTRAVENOUS | Status: AC
  Administered 2022-04-04: 2 mg/h via INTRAVENOUS
  Filled 2022-04-04: qty 50

## 2022-04-04 MED ORDER — LABETALOL HCL 5 MG/ML IV SOLN
20.0000 mg | Freq: Once | INTRAVENOUS | Status: AC
Start: 2022-04-04 — End: 2022-04-04
  Administered 2022-04-04 (×2): 10 mg via INTRAVENOUS

## 2022-04-04 MED ORDER — POTASSIUM CHLORIDE 10 MEQ/50ML IV SOLN: 10.0000 meq | INTRAVENOUS | Status: DC

## 2022-04-04 NOTE — ED Provider Notes (Signed)
[place] EMERGENCY DEPARTMENT Provider Note   CSN: [id number] Arrival date & time: 04/04/22  1701  An emergency department physician performed an initial assessment on this suspected stroke patient at 64.  History  Chief Complaint  Patient presents with   Code Stroke    Yvonne [name] is a 76 y.o. female.  HPI Patient presents for strokelike symptoms.  Medical history includes HTN, OSA, hypothyroidism, DM 2, neuropathy, arthritis, HLD, anxiety, MVP.  Symptoms were witnessed by family at approximately 4 PM.  Last known well was 1 hour prior.  Patient had expressive aphasia.  Speech was described as garbled.  Patient had improved but continued symptoms with EMS.  CBG was 150.  Vital signs were notable for hypertension.    Home Medications Prior to Admission medications   Medication Sig Start Date End Date Taking? Authorizing Provider  acetaminophen (TYLENOL) 500 MG tablet Take 1,000 mg by mouth every 8 (eight) hours as needed for moderate pain.   Yes [provider]  Cholecalciferol (VITAMIN D) 50 MCG (2000 UT) tablet Take 2,000 Units by mouth daily.    Yes [provider]  fluticasone (FLONASE) 50 MCG/ACT nasal spray Place 2 sprays into both nostrils at bedtime.   Yes [provider]  furosemide (LASIX) 20 MG tablet Take 10 mg by mouth daily.   Yes [provider]  gabapentin (NEURONTIN) 300 MG capsule Take 300 mg by mouth at bedtime.    Yes Kathyrn Lass, MD  levothyroxine (SYNTHROID, LEVOTHROID) 100 MCG tablet Take 100 mcg by mouth daily before breakfast.   Yes [provider]  metoprolol succinate (TOPROL-XL) 25 MG 24 hr tablet TAKE 1/2 TABLET BY MOUTH DAILY. 11/26/14  Yes Darlin Coco, MD  metroNIDAZOLE (METROCREAM) 0.75 % cream Apply 1 application topically daily.  12/21/14  Yes [provider]  OZEMPIC, 0.25 OR 0.5 MG/DOSE, 2 MG/3ML SOPN Inject 0.25 mg into the skin once a week. 03/01/22  Yes  [provider]  potassium chloride SA (K-DUR,KLOR-CON) 20 MEQ tablet TAKE 1/2 TABLET BY MOUTH DAILY. 09/30/15  Yes Darlin Coco, MD  Prenatal Vit-Fe Fumarate-FA (PRENATAL MULTIVITAMIN) TABS tablet Take 1 tablet by mouth daily.   Yes [provider]  rosuvastatin (CRESTOR) 5 MG tablet Take 1 tablet (5 mg total) by mouth daily. 07/04/15  Yes Darlin Coco, MD  gabapentin (NEURONTIN) 100 MG capsule Take 1 capsule (100 mg total) by mouth 3 (three) times daily. PRN Patient not taking: Reported on 04/04/2022 08/11/20   Kathrynn Ducking, MD      Allergies    Tobramycin, Sulfa antibiotics, and Erythromycin    Review of Systems   Review of Systems  Neurological:  Positive for speech difficulty.  All other systems reviewed and are negative.   Physical Exam Updated Vital Signs BP (!) 157/81   Pulse 79   Temp 98.3 F (36.8 C) (Oral)   Resp 10   Wt 61 kg   SpO2 97%   BMI 21.71 kg/m  Physical Exam Vitals and nursing note reviewed.  Constitutional:      General: She is not in acute distress.    Appearance: Normal appearance. She is well-developed and normal weight. She is not ill-appearing, toxic-appearing or diaphoretic.  HENT:     Head: Normocephalic and atraumatic.     Right Ear: External ear normal.     Left Ear: External ear normal.     Nose: Nose normal.     Mouth/Throat:     Mouth:  Mucous membranes are moist.     Pharynx: Oropharynx is clear.  Eyes:     Extraocular Movements: Extraocular movements intact.     Conjunctiva/sclera: Conjunctivae normal.  Cardiovascular:     Rate and Rhythm: Normal rate and regular rhythm.  Pulmonary:     Effort: Pulmonary effort is normal. No respiratory distress.  Abdominal:     General: Abdomen is flat. There is no distension.     Tenderness: There is no abdominal tenderness.  Musculoskeletal:        General: No swelling. Normal range of motion.     Cervical back: Normal range of motion and neck supple.  Skin:     General: Skin is warm and dry.     Coloration: Skin is not jaundiced or pale.  Neurological:     Mental Status: She is alert.     Cranial Nerves: No dysarthria or facial asymmetry.     Sensory: Sensation is intact.     Motor: Motor function is intact.     Coordination: Coordination is intact.     Comments: Word finding difficulty  Psychiatric:        Mood and Affect: Mood normal.        Behavior: Behavior normal.     ED Results / Procedures / Treatments   Labs (all labs ordered are listed, but only abnormal results are displayed) Labs Reviewed  COMPREHENSIVE METABOLIC PANEL - Abnormal; Notable for the following components:      Result Value   Potassium 3.0 (*)    Chloride 112 (*)    CO2 20 (*)    Glucose, Bld 135 (*)    Calcium 7.3 (*)    Total Protein 5.3 (*)    Albumin 3.4 (*)    All other components within normal limits  HEMOGLOBIN A1C - Abnormal; Notable for the following components:   Hgb A1c MFr Bld 6.5 (*)    All other components within normal limits  GLUCOSE, CAPILLARY - Abnormal; Notable for the following components:   Glucose-Capillary 178 (*)    All other components within normal limits  I-STAT CHEM 8, ED - Abnormal; Notable for the following components:   Potassium 3.0 (*)    Creatinine, Ser 0.30 (*)    Glucose, Bld 126 (*)    Calcium, Ion 0.96 (*)    Hemoglobin 11.2 (*)    HCT 33.0 (*)    All other components within normal limits  MRSA NEXT GEN BY PCR, NASAL  RESP PANEL BY RT-PCR (FLU A&B, COVID) ARPGX2  ETHANOL  PROTIME-INR  APTT  CBC  DIFFERENTIAL  LIPID PANEL  CBC  BASIC METABOLIC PANEL  RAPID URINE DRUG SCREEN, HOSP PERFORMED  URINALYSIS, ROUTINE W REFLEX MICROSCOPIC    EKG None  Radiology CT ANGIO HEAD NECK W WO CM (CODE STROKE)  Result Date: 04/04/2022 CLINICAL DATA:  Neuro deficit, acute, stroke suspected EXAM: CT ANGIOGRAPHY HEAD AND NECK TECHNIQUE: Multidetector CT imaging of the head and neck was performed using the standard  protocol during bolus administration of intravenous contrast. Multiplanar CT image reconstructions and MIPs were obtained to evaluate the vascular anatomy. Carotid stenosis measurements (when applicable) are obtained utilizing NASCET criteria, using the distal internal carotid diameter as the denominator. RADIATION DOSE REDUCTION: This exam was performed according to the departmental dose-optimization program which includes automated exposure control, adjustment of the mA and/or kV according to patient size and/or use of iterative reconstruction technique. CONTRAST:  60 mL Omnipaque 350 COMPARISON:  None Available. FINDINGS:  CTA NECK Aortic arch: Unremarkable with trace calcified plaque. Great vessel origins are patent. Right carotid system: Patent.  No stenosis. Left carotid system: Patent.  No stenosis. Vertebral arteries: Patent and codominant.  No stenosis. Skeleton: Facet predominance cervical spine degenerative changes. Other neck: Unremarkable. Upper chest: Included upper lungs are clear. Review of the MIP images confirms the above findings CTA HEAD Anterior circulation: Intracranial internal carotid arteries are patent. Anterior cerebral arteries are patent. Anterior communicating artery is identified. Middle cerebral arteries are patent. Posterior circulation: Intracranial vertebral arteries are patent. Basilar artery is patent. Major cerebellar artery origins are patent. Posterior communicating arteries are not identified. Venous sinuses: Patent as allowed by contrast bolus timing. Review of the MIP images confirms the above findings IMPRESSION: No large vessel occlusion, hemodynamically significant stenosis, or evidence of dissection. Electronically Signed   By: Macy Mis M.D.   On: 04/04/2022 17:30   CT HEAD CODE STROKE WO CONTRAST  Result Date: 04/04/2022 CLINICAL DATA:  Code stroke.  Aphasia EXAM: CT HEAD WITHOUT CONTRAST TECHNIQUE: Contiguous axial images were obtained from the base of the  skull through the vertex without intravenous contrast. RADIATION DOSE REDUCTION: This exam was performed according to the departmental dose-optimization program which includes automated exposure control, adjustment of the mA and/or kV according to patient size and/or use of iterative reconstruction technique. COMPARISON:  None Available. FINDINGS: Brain: There is no acute intracranial hemorrhage, mass effect, or edema. Gray-white differentiation is preserved. There is no extra-axial fluid collection. Ventricles and sulci are within normal limits in size and configuration. Vascular: No hyperdense vessel.There is atherosclerotic calcification at the skull base. Skull: Calvarium is unremarkable. Sinuses/Orbits: No acute finding. Lobular opacification of the left sphenoid sinus. Other: Mastoid air cells are clear. ASPECTS ([place] Stroke Program Early CT Score) - Ganglionic level infarction (caudate, lentiform nuclei, internal capsule, insula, M1-M3 cortex): 7 - Supraganglionic infarction (M4-M6 cortex): 3 Total score (0-10 with 10 being normal): 10 IMPRESSION: There is no acute intracranial hemorrhage or evidence of acute infarction. ASPECT score is 10. These results were communicated to Dr. Rory Percy at 5:18 pm on 04/04/2022 by text page via the [facility] messaging system. Electronically Signed   By: Macy Mis M.D.   On: 04/04/2022 17:19    Procedures Procedures    Medications Ordered in ED Medications   stroke: early stages of recovery book (has no administration in time range)  0.9 %  sodium chloride infusion ( Intravenous Stopped 04/04/22 2300)  acetaminophen (TYLENOL) tablet 650 mg (650 mg Oral Given 04/04/22 2054)    Or  acetaminophen (TYLENOL) 160 MG/5ML solution 650 mg ( Per Tube See Alternative 04/04/22 2054)    Or  acetaminophen (TYLENOL) suppository 650 mg ( Rectal See Alternative 04/04/22 2054)  senna-docusate (Senokot-S) tablet 1 tablet (has no administration in time range)  labetalol (NORMODYNE)  injection 20 mg (10 mg Intravenous Given 04/04/22 1744)    And  clevidipine (CLEVIPREX) infusion 0.5 mg/mL (1 mg/hr Intravenous Infusion Verify 04/05/22 0000)  potassium chloride 10 mEq in 100 mL IVPB ( Intravenous Infusion Verify 04/05/22 0000)  insulin aspart (novoLOG) injection 0-9 Units (has no administration in time range)  Oral care mouth rinse (has no administration in time range)  Chlorhexidine Gluconate Cloth 2 % PADS 6 each (has no administration in time range)  pantoprazole (PROTONIX) EC tablet 40 mg (has no administration in time range)  tenecteplase (TNKASE) injection for Stroke 15 mg (15 mg Intravenous Given 04/04/22 1726)  iohexol (OMNIPAQUE) 350 MG/ML injection 60  mL (60 mLs Intravenous Contrast Given 04/04/22 1719)  potassium chloride SA (KLOR-CON M) CR tablet 40 mEq (40 mEq Oral Given 04/04/22 2053)    ED Course/ Medical Decision Making/ A&P                           Medical Decision Making Amount and/or Complexity of Data Reviewed Labs: ordered. Radiology: ordered.  Risk Prescription drug management. Decision regarding hospitalization.   This patient presents to the ED for concern of strokelike symptoms, this involves an extensive number of treatment options, and is a complaint that carries with it a high risk of complications and morbidity.  The differential diagnosis includes CVA, TIA, seizure, migraine, hypoglycemia, intoxication, polypharmacy   Co morbidities that complicate the patient evaluation  HLD, hypothyroidism, DM2, neuropathy, anxiety, HTN, OSA,   Additional history obtained:  Additional history obtained from EMS External records from outside source obtained and reviewed including EMR   Lab Tests:  I Ordered, and personally interpreted labs.  The pertinent results include: Normal blood glucose, normal kidney function, hypokalemia, normal hemoglobin, no leukocytosis.   Imaging Studies ordered:  I ordered imaging studies including CT head, CTA  head and neck I independently visualized and interpreted imaging which showed no acute findings I agree with the radiologist interpretation   Cardiac Monitoring: / EKG:  The patient was maintained on a cardiac monitor.  I personally viewed and interpreted the cardiac monitored which showed an underlying rhythm of: Sinus rhythm   Consultations Obtained:  I requested consultation with the neurologist, Dr. Rory Percy,  and discussed lab and imaging findings as well as pertinent plan - they recommend: Lowering of hypertension, TNKase and admission to neuro ICU   Problem List / ED Course / Critical interventions / Medication management  Patient is a 76 year old female presenting for acute symptoms of word finding difficulties and reported garbled speech at home.  This was witnessed by family approximately 1 hour prior to arrival.  On arrival, patient is awake and alert.  Exam is notable for word finding difficulty only.  Vital signs are notable for hypertension in the range of 190 SBP.  Patient arrives as a code stroke was taken directly to [facility].  CT scans were negative.  Given her symptoms, neurology provided orders for lowering of blood pressure, TNKase administration, and neuro ICU admission.  Patient was transported to ICU in stable condition.   Social Determinants of Health:  Has PCP         Final Clinical Impression(s) / ED Diagnoses Final diagnoses:  Stroke-like symptoms    Rx / DC Orders ED Discharge Orders     None         Godfrey Pick, MD 04/05/22 [phone]

## 2022-04-04 NOTE — Progress Notes (Signed)
EEG complete - results pending 

## 2022-04-04 NOTE — ED Triage Notes (Signed)
Pt via [facility] as code stroke, [place] via phone call w daughter. Pt arrived home, face-to-face with daughter 1620, EMS arrived 61. NIH 1 for aphasia & AMS, no other deficits.   197/94 HR 92 CBG 150  18g R wrist, 18LAC  Neuro at bedside on pt arrival, pt to CT2

## 2022-04-04 NOTE — Progress Notes (Signed)
PHARMACIST CODE STROKE RESPONSE  Notified to mix TNK at 17:18 by Dr. Rory Percy TNK preparation completed at 17:21  TNK dose = 15 mg IV over 5 seconds  Issues/delays encountered (if applicable): at time notified to mix tenecteplase, last known BP was above threshold; had to wait for BP monitor to repeat BP measurement.   Carolin Guernsey Clinical Pharmacist 04/04/22 5:55 PM

## 2022-04-04 NOTE — H&P (Addendum)
Neurology H&P   CC: aphasia  History is obtained from: chart, daughter  HPI: Yvonne [name] is a 76 y.o. female past medical history of diabetes, hypertension, anxiety, peripheral neuropathy, sleep apnea, presenting to the emergency room via EMS for concerns for strokelike symptoms of aphasia. Last known well was 3 or 3:30 PM when daughter spoke with her over the phone.  She came to the daughter's house around 4:20 PM when the daughter noticed that her speech was not making sense.  She was trying to find words but was unable to speak normally.  911 was called.  Fire arrived, and soon after EMS arrived and on their screening, her symptoms were concerning for stroke and activated a code stroke. She was seen at the bridge on the ER on arrival. See detailed exam below Low NIH stroke scale but disabling symptoms of aphasia noted. Spoke with the daughter who is a Haematologist.  Explained risks and benefits of IV TNKase.  The daughter agreed. The patient's blood pressure on arrival was also close to 409 systolic.  Required a dose of labetalol to bring it down. Also needed to recheck last known well was because according to EMS, 1620 was symptom identification but they were not clear on the last known well.  The daughter on the phone clarified the last known well to be 3:30 PM.   LKW: 3:30 PM today IV thrombolysis given?: yes Premorbid modified Rankin scale (mRS): 0  ROS: Full ROS was performed and is negative except as noted in the HPI.  Past Medical History:  Diagnosis Date   Anxiety    Chest pressure    Degenerative arthritis    Deviated septum    Diabetes mellitus without complication (HCC)    diet controlled   Diabetic neuropathy ([place]) 12/03/2017   DJD (degenerative joint disease) of knee    Family history of bladder cancer    Family history of brain cancer    Family history of breast cancer    Family history of ovarian cancer    Family history of prostate cancer    Family history of  stomach cancer    Family history of throat cancer    Family history of uterine cancer    Graves disease    RADIOACTIVE IODINE 1983   H/O hiatal hernia    Heart palpitations    History of diastolic dysfunction    per echo in 2011   Hyperlipidemia    Hypertension    Hypothyroidism    LVH (left ventricular hypertrophy)    per echo in 2011   Mitral valve prolapse    Neuropathy, peripheral    DR. Jannifer [place] 12/15   Normal nuclear stress test 2011   OSA (obstructive sleep apnea)    Polyneuropathy in other diseases classified elsewhere ([place]) 05/21/2013   Rosacea 2019   MELANOMA CHEST REMOVED IN SITU   Sleep difficulties    Tinnitus      Family History  Problem Relation Age of Onset   Hypertension Mother    Heart disease Mother    Dementia Mother    Heart disease Father    Arthritis Father    Diabetes Father    Hypertension Father    Pancreatic cancer Brother 68       pancreatic   Other Brother        automobile accident   Throat cancer Maternal Uncle    Brain cancer Paternal Aunt 20       malignant brain cancer  Stroke Maternal Grandmother    Heart disease Maternal Grandfather    Pancreatic cancer Cousin 2       pancreatic (maternal first cousin)   Pancreatic cancer Cousin 56       pancreatic, smoker (maternal first cousin)   Breast cancer Cousin 45       breast (maternal first cousin)   Ovarian cancer Cousin    Uterine cancer Cousin 25       uterine (paternal first cousin)   Cancer Cousin 25       unknown primary (paternal first cousin)   Bladder Cancer Cousin 83       (paternal first cousin)   Lung cancer Cousin    Throat cancer Cousin    Prostate cancer Cousin        (maternal first cousin)   Stomach cancer Cousin 39       (maternal first cousin)   Cancer Other 57       pancreatic neuroendocrine carcinoma   Colon cancer Neg Hx    Neuropathy Neg Hx      Social History:   reports that she quit smoking about 46 years ago. Her smoking use included  cigarettes. She has a 5.00 pack-year smoking history. She has never used smokeless tobacco. She reports current alcohol use. She reports that she does not use drugs.  Medications  Current Facility-Administered Medications:    [START ON 04/05/2022]  stroke: early stages of recovery book, , Does not apply, Once, Amie Portland, MD   0.9 %  sodium chloride infusion, , Intravenous, Continuous, Amie Portland, MD, Last Rate: 75 mL/hr at 04/04/22 1806, New Bag at 04/04/22 1806   acetaminophen (TYLENOL) tablet 650 mg, 650 mg, Oral, Q4H PRN **OR** acetaminophen (TYLENOL) 160 MG/5ML solution 650 mg, 650 mg, Per Tube, Q4H PRN **OR** acetaminophen (TYLENOL) suppository 650 mg, 650 mg, Rectal, Q4H PRN, Amie Portland, MD   [COMPLETED] labetalol (NORMODYNE) injection 20 mg, 20 mg, Intravenous, Once, 10 mg at 04/04/22 1744 **AND** clevidipine (CLEVIPREX) infusion 0.5 mg/mL, 0-21 mg/hr, Intravenous, Continuous, Amie Portland, MD, Last Rate: 4 mL/hr at 04/04/22 1739, 2 mg/hr at 04/04/22 1739   pantoprazole (PROTONIX) injection 40 mg, 40 mg, Intravenous, QHS, Amie Portland, MD   senna-docusate (Senokot-S) tablet 1 tablet, 1 tablet, Oral, QHS PRN, Amie Portland, MD  Current Outpatient Medications:    acetaminophen (TYLENOL) 500 MG tablet, Take 1,000 mg by mouth every 8 (eight) hours as needed for moderate pain., Disp: , Rfl:    ALPRAZolam (XANAX) 0.25 MG tablet, Take 0.25 mg by mouth daily as needed for anxiety. , Disp: , Rfl:    Cholecalciferol (VITAMIN D) 50 MCG (2000 UT) tablet, Take 2,000 Units by mouth daily. , Disp: , Rfl:    fluticasone (FLONASE) 50 MCG/ACT nasal spray, Place 2 sprays into both nostrils at bedtime., Disp: , Rfl:    furosemide (LASIX) 20 MG tablet, Take 10 mg by mouth daily., Disp: , Rfl:    gabapentin (NEURONTIN) 100 MG capsule, Take 1 capsule (100 mg total) by mouth 3 (three) times daily. PRN, Disp: 90 capsule, Rfl: 1   gabapentin (NEURONTIN) 300 MG capsule, Take 300 mg by mouth at bedtime. ,  Disp: , Rfl:    levothyroxine (SYNTHROID, LEVOTHROID) 100 MCG tablet, Take 100 mcg by mouth daily before breakfast., Disp: , Rfl:    metFORMIN (GLUCOPHAGE) 500 MG tablet, Take 500 mg by mouth daily with lunch., Disp: , Rfl:    metoprolol succinate (TOPROL-XL) 25 MG 24 hr tablet, TAKE  1/2 TABLET BY MOUTH DAILY., Disp: 45 tablet, Rfl: 3   metroNIDAZOLE (METROCREAM) 0.75 % cream, Apply 1 application topically daily. , Disp: , Rfl:    potassium chloride SA (K-DUR,KLOR-CON) 20 MEQ tablet, TAKE 1/2 TABLET BY MOUTH DAILY., Disp: 45 tablet, Rfl: 3   Prenatal Vit-Fe Fumarate-FA (PRENATAL MULTIVITAMIN) TABS tablet, Take 1 tablet by mouth daily., Disp: , Rfl:    rosuvastatin (CRESTOR) 5 MG tablet, Take 1 tablet (5 mg total) by mouth daily., Disp: 90 tablet, Rfl: 3  Exam: Current vital signs: BP (!) 155/74   Pulse 88   Temp 98.3 F (36.8 C)   Resp 19   Wt 61 kg   SpO2 98%   BMI 21.71 kg/m  Vital signs in last 24 hours: Temp:  [98.3 F (36.8 C)] 98.3 F (36.8 C) (08/23 1741) Pulse Rate:  [87-92] 88 (08/23 1757) Resp:  [10-19] 19 (08/23 1757) BP: (155-184)/(74-92) 155/74 (08/23 1757) SpO2:  [98 %-100 %] 98 % (08/23 1757) Weight:  [61 kg] 61 kg (08/23 1700) General: Awake alert in no distress HEENT: Normocephalic atraumatic Lungs: Clear Cardiovascular:: Regular rhythm Abdomen nondistended nontender Extremities warm well perfused Neurological exam She is awake alert oriented x3 Speech and language: She has no dysarthria.  She has definite trouble bringing out words.  She is able to name some simple objects such as my eyeglasses but had trouble naming my watch.  She was able to follow some simple commands but at times that was inconsistent. Cranial nerves II to XII intact Motor examination with no drift in any of the 4 extremities but there is subtle weakness in bilateral lower extremities that is symmetric. Sensory exam intact to light touch without extinction Coordination with no  dysmetria Gait testing deferred NIH stroke scale 1 for aphasia  Labs I have reviewed labs in epic and the results pertinent to this consultation are:  CBC    Component Value Date/Time   WBC 7.3 04/04/2022 1714   RBC 4.50 04/04/2022 1714   HGB 11.2 (L) 04/04/2022 1720   HGB 13.7 11/15/2020 1210   HCT 33.0 (L) 04/04/2022 1720   PLT 168 04/04/2022 1714   PLT 208 11/15/2020 1210   MCV 82.0 04/04/2022 1714   MCH 28.4 04/04/2022 1714   MCHC 34.7 04/04/2022 1714   RDW 13.1 04/04/2022 1714   LYMPHSABS 2.3 04/04/2022 1714   MONOABS 0.7 04/04/2022 1714   EOSABS 0.3 04/04/2022 1714   BASOSABS 0.0 04/04/2022 1714    CMP     Component Value Date/Time   NA 144 04/04/2022 1720   K 3.0 (L) 04/04/2022 1720   CL 109 04/04/2022 1720   CO2 20 (L) 04/04/2022 1714   GLUCOSE 126 (H) 04/04/2022 1720   BUN 9 04/04/2022 1720   CREATININE 0.30 (L) 04/04/2022 1720   CREATININE 0.74 11/15/2020 1210   CREATININE 0.71 01/04/2016 0739   CALCIUM 7.3 (L) 04/04/2022 1714   PROT 5.3 (L) 04/04/2022 1714   PROT 6.7 10/03/2021 0918   ALBUMIN 3.4 (L) 04/04/2022 1714   AST 22 04/04/2022 1714   AST 15 11/15/2020 1210   ALT 19 04/04/2022 1714   ALT 21 11/15/2020 1210   ALKPHOS 43 04/04/2022 1714   BILITOT 0.5 04/04/2022 1714   BILITOT 0.4 11/15/2020 1210   GFRNONAA >60 04/04/2022 1714   GFRNONAA >60 11/15/2020 1210   GFRAA >60 10/23/2019 1004    Lipid Panel     Component Value Date/Time   CHOL 134 01/04/2016 0739   TRIG  91 01/04/2016 0739   HDL 39 (L) 01/04/2016 0739   CHOLHDL 3.4 01/04/2016 0739   VLDL 18 01/04/2016 0739   LDLCALC 77 01/04/2016 0739     Imaging I have reviewed the images obtained:  CT-head: Aspects 10.  No bleed CT angiography head and neck-no emergent LVO  Assessment:  76 year old woman with above past medical history presenting with symptoms consistent with mixed receptive and expressive aphasia within the window for IV thrombolysis. Start blood pressure in the  200s. Risks and benefits were discussed with the daughter prior to IV TNKase administration.  Inclusion exclusion criteria checked with family-no exclusion criteria. CT imaging reviewed prior to [facility] administration.  No bleed.  Differentials Left hemispheric stroke TGA Hypertensive emergency  Currently status post IV thrombolysis at 1726 hrs.    Recommendations: Admit to neuro ICU Post IV thrombolysis vitals Post IV thrombolysis neurochecks Currently on Cleviprex very low-dose.  Keep blood pressure between 726-203 systolic with the ordered medications. Stat CT head for neuro change MRI brain at 24 hours 2D echo A1c Lipid panel Gentle hydration with normal saline 75 cc/h Replete potassium-hypokalemia with potassium of 3.0. Check morning labs-replete electrolytes as necessary N.p.o. until cleared by bedside swallow screen or formal swallow evaluation If clear swallow screen, can get heart healthy carb modified diet. Blood pressure goal as above No leukocytosis.  No preceding complaints of fevers chills or infection. Routine EEG Sliding scale insulin  Present on arrival: Acute ischemic stroke, hypertensive emergency.  Plan was discussed with ED provider Dr. Doren Custard as well as patient's daughter and family friend at bedside.  -- Amie Portland, MD Neurologist [facility] Pager: [phone]  CRITICAL CARE ATTESTATION Performed by: Amie Portland, MD Total critical care time: 65 minutes Critical care time was exclusive of separately billable procedures and treating other patients and/or supervising APPs/Residents/Students Critical care was necessary to treat or prevent imminent or life-threatening deterioration due to acute ischemic stroke administration of IV thrombolysis This patient is critically ill and at significant risk for neurological worsening and/or death and care requires constant monitoring. Critical care was time spent personally by me on the  following activities: development of treatment plan with patient and/or surrogate as well as nursing, discussions with consultants, evaluation of patient's response to treatment, examination of patient, obtaining history from patient or surrogate, ordering and performing treatments and interventions, ordering and review of laboratory studies, ordering and review of radiographic studies, pulse oximetry, re-evaluation of patient's condition, participation in multidisciplinary rounds and medical decision making of high complexity in the care of this patient.

## 2022-04-05 ENCOUNTER — Inpatient Hospital Stay (HOSPITAL_COMMUNITY): Payer: Medicare PPO

## 2022-04-05 ENCOUNTER — Encounter (HOSPITAL_COMMUNITY): Payer: Self-pay

## 2022-04-05 DIAGNOSIS — I6389 Other cerebral infarction: Secondary | ICD-10-CM

## 2022-04-05 DIAGNOSIS — R569 Unspecified convulsions: Secondary | ICD-10-CM

## 2022-04-05 LAB — URINALYSIS, ROUTINE W REFLEX MICROSCOPIC
Bilirubin Urine: NEGATIVE
Glucose, UA: NEGATIVE mg/dL
Hgb urine dipstick: NEGATIVE
Ketones, ur: NEGATIVE mg/dL
Leukocytes,Ua: NEGATIVE
Nitrite: NEGATIVE
Protein, ur: NEGATIVE mg/dL
Specific Gravity, Urine: 1.005 (ref 1.005–1.030)
pH: 7 (ref 5.0–8.0)

## 2022-04-05 LAB — LIPID PANEL
Cholesterol: 126 mg/dL (ref 0–200)
HDL: 36 mg/dL — ABNORMAL LOW (ref >40)
LDL Cholesterol: 66 mg/dL (ref 0–99)
Total CHOL/HDL Ratio: 3.5 RATIO
Triglycerides: 118 mg/dL (ref <150)
VLDL: 24 mg/dL (ref 0–40)

## 2022-04-05 LAB — CBC
HCT: 35.6 % — ABNORMAL LOW (ref 36.0–46.0)
Hemoglobin: 12.8 g/dL (ref 12.0–15.0)
MCH: 28.5 pg (ref 26.0–34.0)
MCHC: 36 g/dL (ref 30.0–36.0)
MCV: 79.3 fL — ABNORMAL LOW (ref 80.0–100.0)
Platelets: 184 10*3/uL (ref 150–400)
RBC: 4.49 MIL/uL (ref 3.87–5.11)
RDW: 13.1 % (ref 11.5–15.5)
WBC: 7.6 10*3/uL (ref 4.0–10.5)
nRBC: 0 % (ref 0.0–0.2)

## 2022-04-05 LAB — GLUCOSE, CAPILLARY
Glucose-Capillary: 123 mg/dL — ABNORMAL HIGH (ref 70–99)
Glucose-Capillary: 185 mg/dL — ABNORMAL HIGH (ref 70–99)
Glucose-Capillary: 152 mg/dL — ABNORMAL HIGH (ref 70–99)
Glucose-Capillary: 136 mg/dL — ABNORMAL HIGH (ref 70–99)

## 2022-04-05 LAB — BASIC METABOLIC PANEL
Anion gap: 7 (ref 5–15)
BUN: 8 mg/dL (ref 8–23)
CO2: 24 mmol/L (ref 22–32)
Calcium: 9.2 mg/dL (ref 8.9–10.3)
Chloride: 108 mmol/L (ref 98–111)
Creatinine, Ser: 0.62 mg/dL (ref 0.44–1.00)
GFR, Estimated: >60 mL/min (ref >60)
Glucose, Bld: 163 mg/dL — ABNORMAL HIGH (ref 70–99)
Potassium: 3.9 mmol/L (ref 3.5–5.1)
Sodium: 139 mmol/L (ref 135–145)

## 2022-04-05 LAB — ECHOCARDIOGRAM COMPLETE
AR max vel: 1.85 cm2
AV Area VTI: 1.83 cm2
AV Area mean vel: 1.78 cm2
AV Mean grad: 3 mmHg
AV Peak grad: 5.2 mmHg
Ao pk vel: 1.14 m/s
Area-P 1/2: 3.99 cm2
S' Lateral: 2.7 cm
Weight: 2151.69 oz

## 2022-04-05 LAB — RAPID URINE DRUG SCREEN, HOSP PERFORMED
Amphetamines: NOT DETECTED
Barbiturates: NOT DETECTED
Benzodiazepines: NOT DETECTED
Cocaine: NOT DETECTED
Opiates: NOT DETECTED
Tetrahydrocannabinol: NOT DETECTED

## 2022-04-05 MED ORDER — METOPROLOL SUCCINATE ER 25 MG PO TB24
12.5000 mg | ORAL_TABLET | Freq: Every day | ORAL | Status: DC
  Administered 2022-04-05 – 2022-04-06 (×2): 12.5 mg via ORAL
  Filled 2022-04-05 (×2): qty 1

## 2022-04-05 MED ORDER — METRONIDAZOLE 0.75 % EX GEL
Freq: Every day | CUTANEOUS | Status: DC
  Filled 2022-04-05: qty 45

## 2022-04-05 MED ORDER — LEVOTHYROXINE SODIUM 100 MCG PO TABS
100.0000 ug | ORAL_TABLET | Freq: Every day | ORAL | Status: DC
  Administered 2022-04-05 – 2022-04-06 (×2): 100 ug via ORAL
  Filled 2022-04-05 (×2): qty 1

## 2022-04-05 MED ORDER — FLUTICASONE PROPIONATE 50 MCG/ACT NA SUSP
2.0000 | Freq: Every day | NASAL | Status: DC
  Filled 2022-04-05 (×2): qty 16

## 2022-04-05 MED ORDER — GADOBUTROL 1 MMOL/ML IV SOLN
6.0000 mL | Freq: Once | INTRAVENOUS | Status: AC | PRN
  Administered 2022-04-05: 6 mL via INTRAVENOUS

## 2022-04-05 MED ORDER — VITAMIN D 25 MCG (1000 UNIT) PO TABS
2000.0000 [IU] | ORAL_TABLET | Freq: Every day | ORAL | Status: DC
  Administered 2022-04-06: 2000 [IU] via ORAL
  Filled 2022-04-05: qty 2

## 2022-04-05 MED ORDER — ADULT MULTIVITAMIN W/MINERALS CH
1.0000 | ORAL_TABLET | Freq: Every day | ORAL | Status: DC
  Administered 2022-04-06: 1 via ORAL
  Filled 2022-04-05: qty 1

## 2022-04-05 MED ORDER — ROSUVASTATIN CALCIUM 5 MG PO TABS
5.0000 mg | ORAL_TABLET | Freq: Every day | ORAL | Status: DC
  Administered 2022-04-05 (×2): 5 mg via ORAL
  Filled 2022-04-05 (×2): qty 1

## 2022-04-05 MED ORDER — LEVETIRACETAM 500 MG PO TABS
500.0000 mg | ORAL_TABLET | Freq: Two times a day (BID) | ORAL | Status: DC
  Administered 2022-04-05 – 2022-04-06 (×3): 500 mg via ORAL
  Filled 2022-04-05 (×3): qty 1

## 2022-04-05 MED ORDER — IOHEXOL 300 MG/ML  SOLN
80.0000 mL | Freq: Once | INTRAMUSCULAR | Status: AC | PRN
  Administered 2022-04-05: 80 mL via INTRAVENOUS

## 2022-04-05 MED ORDER — GABAPENTIN 300 MG PO CAPS
300.0000 mg | ORAL_CAPSULE | Freq: Every day | ORAL | Status: DC
  Administered 2022-04-05 (×2): 300 mg via ORAL
  Filled 2022-04-05 (×2): qty 1

## 2022-04-05 MED ORDER — IOHEXOL 9 MG/ML PO SOLN
500.0000 mL | ORAL | Status: AC
  Administered 2022-04-05 (×2): 500 mL via ORAL

## 2022-04-05 NOTE — Progress Notes (Addendum)
Subjective: No further seizure-like episodes overnight.  Denies any new concerns.  Daughter and husband at bedside.  Denies prior history of seizures.  However does state that her mother was diagnosed with Pick's disease and possibly had seizures.   ROS: negative except above  Examination  Vital signs in last 24 hours: Temp:  [97.5 F (36.4 C)-98.5 F (36.9 C)] 97.5 F (36.4 C) (08/24 1200) Pulse Rate:  [73-95] 83 (08/24 1100) Resp:  [10-25] 18 (08/24 1100) BP: (118-220)/(54-136) 141/56 (08/24 1100) SpO2:  [93 %-100 %] 95 % (08/24 1100) Weight:  [61 kg] 61 kg (08/23 1700)  General: lying in bed, NAD Neuro: Awake, alert, oriented, following commands, no aphasia, spontaneously moving all 4 extremities in bed.   Basic Metabolic Panel: Recent Labs  Lab 04/04/22 1714 04/04/22 1720 04/05/22 0209  NA 139 144 139  K 3.0* 3.0* 3.9  CL 112* 109 108  CO2 20*  --  24  GLUCOSE 135* 126* 163*  BUN '10 9 8  '$ CREATININE 0.49 0.30* 0.62  CALCIUM 7.3*  --  9.2    CBC: Recent Labs  Lab 04/04/22 1714 04/04/22 1720 04/05/22 0209  WBC 7.3  --  7.6  NEUTROABS 4.0  --   --   HGB 12.8 11.2* 12.8  HCT 36.9 33.0* 35.6*  MCV 82.0  --  79.3*  PLT 168  --  184     Coagulation Studies: Recent Labs    04/04/22 1714  LABPROT 13.9  INR 1.1    Imaging CT head without contrast 04/04/2021: There is no acute intracranial hemorrhage or evidence of acute infarction. ASPECT score is 10.  CTA head and neck with and without contrast 04/04/2022: No large vessel occlusion, hemodynamically significant stenosis, or evidence of dissection.   ASSESSMENT AND PLAN: 76 year old female who presented with sudden onset aphasia which has since resolved.  New onset seizure -EEG showed left anterior temporal spikes -No clear provoking factors.  Neck medications -We will obtain MRI brain with and without contrast to look for acute intracranial abnormalities -We will start Keppra 500 mg twice  daily -continue seizure precautions -Discussed seizure precautions including do not drive for 6 months  -Patient had multiple questions about the diagnosis medications the precautions and further work-up.  These were discussed in detail with patient and family at bedside  ADDENDUM - MRI brain w and wo contrast reviewed: 3 cm region of masslike signal abnormality in the left temporal lobe with small foci of enhancement, most concerning for primary CNS neoplasm. Demyelinating disease, vasculitis, and infection are less likely considerations. No acute infarct.    - Discussed MRI findings with patient and daughter - Will consult neurosurg - Will obtain overnight EEG to look for intermittent seizure - Will also plan for LP for possible autoimmune, etiology, holding DVT ppx.  I have spent a total of  60  minutes with the patient reviewing hospital notes,  test results, labs and examining the patient as well as establishing an assessment and plan that was discussed personally with the patient, daughter and husband at bedside.  > 50% of time was spent in direct patient care.    Zeb Comfort Epilepsy [facility] For questions after 5pm please refer to AMION to reach the Neurologist on call

## 2022-04-05 NOTE — Progress Notes (Signed)
Reordered nightly gabapentin and Crestor per patient request; nursing reports she passed swallow evaluation and her aphasia has improved  No charge note

## 2022-04-05 NOTE — Progress Notes (Signed)
OT Cancellation Note  Patient Details Name: Yvonne [name] MRN: [id number] DOB: June 30, 1946   Cancelled Treatment:    Reason Eval/Treat Not Completed: Patient at procedure or test/ unavailable (MRI)  Rubby Barbary,HILLARY 04/05/2022, 2:21 PM

## 2022-04-05 NOTE — Progress Notes (Signed)
LTM EEG hooked up and running - no initial skin breakdown - push button tested - neuro notified. [facility] monitoring.  

## 2022-04-05 NOTE — Progress Notes (Signed)
PT Cancellation Note  Patient Details Name: Yvonne [name] MRN: [id number] DOB: [birth year]   Cancelled Treatment:    Reason Eval/Treat Not Completed: Active bedrest order. Communicated with RN to notify PT if pt's bedrest orders are discontinued later today. Will plan to follow-up as able.   Moishe Spice, PT, DPT Acute Rehabilitation Services  Office: [place] 04/05/2022, 8:12 AM

## 2022-04-05 NOTE — Evaluation (Addendum)
Physical Therapy Evaluation Patient Details Name: Yvonne [name] MRN: [id number] DOB: 04-12-1946 Today's Date: 04/05/2022  History of Present Illness  Pt is a 76 y.o. female who presented 04/04/22 with aphasia. Pt received TNKase. EEG showed left anterior temporal spikes. MRI of brain revealed concern for primary CNS neoplasm in the L temporal lobe. PMH: DM, neuropathy, DJD of knee, Graves disease, HLD, HTN, hypothyroidism, LVH, mitral valve prolapse, rosacea, tinnitus   Clinical Impression  Pt presents with condition above and deficits mentioned below, see PT Problem List. PTA, she was IND without DME, living with her husband in a 1-level house with a flat entrance. Currently, pt seems to demonstrate [facility], symmetrical and intact bil lower extremity coordination and strength. She has chronic bil knee pain and bil peripheral neuropathy. She is demonstrating some mild balance deficits with x1 LOB requiring minA to recover, primarily just when turning her head L <> R, but this improved with subsequent repetition. I anticipate pt will quickly progress back to her baseline with increased mobility, thus no PT follow-up needed at this time. Will continue to follow acutely for likely just x1 more session to ensure pt's balance improves prior to d/c as she is at risk for falls, indicated by her DGI score of 18/24 today.     Recommendations for follow up therapy are one component of a multi-disciplinary discharge planning process, led by the attending physician.  Recommendations may be updated based on patient status, additional functional criteria and insurance authorization.  Follow Up Recommendations No PT follow up      Assistance Recommended at Discharge Intermittent Supervision/Assistance  Patient can return home with the following  A little help with bathing/dressing/bathroom;Assistance with cooking/housework;Assist for transportation    Equipment Recommendations None recommended by PT   Recommendations for Other Services       Functional Status Assessment Patient has had a recent decline in their functional status and demonstrates the ability to make significant improvements in function in a reasonable and predictable amount of time.     Precautions / Restrictions Precautions Precautions: Fall;Other (comment) Precaution Comments: seizures Restrictions Weight Bearing Restrictions: No      Mobility  Bed Mobility Overal bed mobility: Modified Independent             General bed mobility comments: Pt able to transition supine <> sit EOB without assistance, HOB elevated slightly.    Transfers Overall transfer level: Needs assistance Equipment used: None Transfers: Sit to/from Stand Sit to Stand: Min guard           General transfer comment: Min guard for safety, mild instability noted initially but no LOB    Ambulation/Gait Ambulation/Gait assistance: Min guard, Min assist Gait Distance (Feet): 640 Feet Assistive device: None Gait Pattern/deviations: Step-through pattern, Decreased stride length Gait velocity: reduced Gait velocity interpretation: >2.62 ft/sec, indicative of community ambulatory   General Gait Details: Pt with fialry symmetrical gait. Pt slowing gait and displaying instability when turning or nodding head only. x1 LOB with first attempt to turn head L <> R while walking, needing minA to recover. After that though pt did not have any further LOB, but did have minor staggering with subsequent head turns.  Stairs Stairs: Yes Stairs assistance: Min guard Stair Management: One rail Right, One rail Left, Step to pattern, Forwards Number of Stairs: 3 General stair comments: Ascends with R rail and desecnds with L rail, step-to pattern, min guard for safety, no LOB  Wheelchair Mobility    Modified Rankin (Stroke  Patients Only) Modified Rankin (Stroke Patients Only) Pre-Morbid Rankin Score: No symptoms Modified Rankin: Moderately  severe disability     Balance Overall balance assessment: Mild deficits observed, not formally tested                               Standardized Balance Assessment Standardized Balance Assessment : Dynamic Gait Index   Dynamic Gait Index Level Surface: Normal Change in Gait Speed: Normal Gait with Horizontal Head Turns: Severe Impairment Gait with Vertical Head Turns: Mild Impairment Gait and Pivot Turn: Normal Step Over Obstacle: Normal Step Around Obstacles: Normal Steps: Moderate Impairment Total Score: 18       Pertinent Vitals/Pain Pain Assessment Pain Assessment: Faces Faces Pain Scale: Hurts little more Pain Location: bil knees (chronic) Pain Descriptors / Indicators: Discomfort Pain Intervention(s): Monitored during session    Home Living Family/patient expects to be discharged to:: Private residence Living Arrangements: Spouse/significant other Available Help at Discharge: Family;Available 24 hours/day Type of Home: House Home Access: Level entry       Home Layout: One level Home Equipment: Conservation officer, nature (2 wheels);Shower seat;Grab bars - toilet;Grab bars - tub/shower      Prior Function Prior Level of Function : Independent/Modified Independent             Mobility Comments: No AD       Hand Dominance        Extremity/Trunk Assessment   Upper Extremity Assessment Upper Extremity Assessment: Defer to OT evaluation    Lower Extremity Assessment Lower Extremity Assessment: RLE deficits/detail;LLE deficits/detail RLE Deficits / Details: MMT scores of 4- hip flexion, quads MMT limited by chronic knee pain, 4+ dorsiflexion; hx of peripheral neuropathy, reports no asymmetry currently; coordination intact RLE Sensation: history of peripheral neuropathy RLE Coordination: WNL LLE Deficits / Details: MMT scores of 4- hip flexion, quads MMT limited by chronic knee pain, 4+ dorsiflexion; hx of peripheral neuropathy, reports no asymmetry  currently; coordination intact LLE Sensation: history of peripheral neuropathy LLE Coordination: WNL    Cervical / Trunk Assessment Cervical / Trunk Assessment: Kyphotic  Communication   Communication: No difficulties  Cognition Arousal/Alertness: Awake/alert Behavior During Therapy: WFL for tasks assessed/performed Overall Cognitive Status: Within Functional Limits for tasks assessed                                 General Comments: Pt following commands appropriately. Will continue to assess.        General Comments      Exercises     Assessment/Plan    PT Assessment Patient needs continued PT services  PT Problem List Decreased strength;Decreased balance;Decreased mobility;Impaired sensation       PT Treatment Interventions DME instruction;Gait training;Stair training;Functional mobility training;Therapeutic activities;Therapeutic exercise;Balance training;Neuromuscular re-education;Patient/family education    PT Goals (Current goals can be found in the Care Plan section)  Acute Rehab PT Goals Patient Stated Goal: to get better PT Goal Formulation: With patient/family Time For Goal Achievement: 04/19/22 Potential to Achieve Goals: Good Additional Goals Additional Goal #1: Pt will achieve a DGI score of >/= 20/24    Frequency Min 3X/week     Co-evaluation               AM-PAC PT "6 Clicks" Mobility  Outcome Measure Help needed turning from your back to your side while in a flat bed without using bedrails?:  None Help needed moving from lying on your back to sitting on the side of a flat bed without using bedrails?: None Help needed moving to and from a bed to a chair (including a wheelchair)?: A Little Help needed standing up from a chair using your arms (e.g., wheelchair or bedside chair)?: A Little Help needed to walk in hospital room?: A Little Help needed climbing 3-5 steps with a railing? : A Little 6 Click Score: 20    End of  Session Equipment Utilized During Treatment: Gait belt Activity Tolerance: Patient tolerated treatment well Patient left: in bed;with call bell/phone within reach;with bed alarm set;with nursing/sitter in room;with family/visitor present Nurse Communication: Mobility status PT Visit Diagnosis: Unsteadiness on feet (R26.81);Other abnormalities of gait and mobility (R26.89);Muscle weakness (generalized) (M62.81)    Time: 1308-6578 PT Time Calculation (min) (ACUTE ONLY): 14 min   Charges:   PT Evaluation $PT Eval Low Complexity: 1 Low          Yvonne [name], PT, DPT Acute Rehabilitation Services  Office: [phone]   Orvan Falconer 04/05/2022, 6:20 PM

## 2022-04-05 NOTE — Procedures (Addendum)
Patient Name: [name]  MRN: [id number]  Epilepsy Attending: Lora Havens  Referring Physician/Provider: Amie Portland, MD  Date: 04/04/2022  Duration: 22.14 mins  Patient history: 76 year old woman with above past medical history presenting with symptoms consistent with mixed receptive and expressive aphasia.  EEG to evaluate for seizure.  Level of alertness: Awake  AEDs during EEG study: GBP  Technical aspects: This EEG study was done with scalp electrodes positioned according to the 10-20 International system of electrode placement. Electrical activity was reviewed with band pass filter of 1-'70Hz'$ , sensitivity of 7 uV/mm, display speed of 35m/sec with a '60Hz'$  notched filter applied as appropriate. EEG data were recorded continuously and digitally stored.  Video monitoring was available and reviewed as appropriate.  Description: The posterior dominant rhythm consists of '8Hz'$  activity of moderate voltage (25-35 uV) seen predominantly in posterior head regions, symmetric and reactive to eye opening and eye closing.  Abundant spikes were noted in left anterior temporal region, maximal F7-T7 which at times appeared quasiperiodic at 0.25 to 1 Hz.  Intermittent rhythmic 2 to '5Hz'$  theta- delta slowing was also noted in left hemisphere, maximal left anterior temporal region without definite evolution.  Hyperventilation and photic stimulation were not performed.     ABNORMALITY  - Spike, left anterior temporal region - Intermittent rhythmic slow, left hemisphere, maximal left anterior temporal region  IMPRESSION: This study showed evidence of epileptogenicity in left anterior temporal region.  Additionally there is cortical dysfunction in left hemisphere, maximal left anterior temporal region.  No definite seizures were seen throughout the recording.  If suspicion for ictal activity remains a concern, a prolonged study should be considered.   Dr. XErlinda Hongwas notified.     Yvonne [name] Yvonne [name]

## 2022-04-05 NOTE — Progress Notes (Signed)
OT Cancellation Note  Patient Details Name: DELVA DERDEN MRN: [id number] DOB: [birth year]   Cancelled Treatment:    Reason Eval/Treat Not Completed: Active bedrest order (Will assess when activity orders updated.)  [facility] 04/05/2022, 7:42 AM Maurie Boettcher, OT/L   Acute OT Clinical Specialist Acute Rehabilitation Services Pager [phone] Office [phone]

## 2022-04-05 NOTE — Progress Notes (Signed)
SLP Cancellation Note  Patient Details Name: Yvonne [name] MRN: [id number] DOB: February 05, 1946   Cancelled treatment:       Reason Eval/Treat Not Completed: Patient at procedure or test/unavailable   Rasmus Preusser, Katherene Ponto 04/05/2022, 1:31 PM

## 2022-04-05 NOTE — Progress Notes (Signed)
I was asked to see this patient by the patient's daughter who is in operating room nurse at [employer], previously at [employer]. I will review her imaging studies and diagnostic tests thus far, and touch base with the patient and her family tomorrow morning. Full consult to follow.  Consuella Lose, MD [facility]

## 2022-04-06 ENCOUNTER — Other Ambulatory Visit (HOSPITAL_COMMUNITY): Payer: Self-pay

## 2022-04-06 DIAGNOSIS — G40909 Epilepsy, unspecified, not intractable, without status epilepticus: Principal | ICD-10-CM

## 2022-04-06 DIAGNOSIS — R569 Unspecified convulsions: Secondary | ICD-10-CM | POA: Diagnosis not present

## 2022-04-06 DIAGNOSIS — D496 Neoplasm of unspecified behavior of brain: Secondary | ICD-10-CM

## 2022-04-06 LAB — BASIC METABOLIC PANEL
Anion gap: 8 (ref 5–15)
BUN: 7 mg/dL — ABNORMAL LOW (ref 8–23)
CO2: 23 mmol/L (ref 22–32)
Calcium: 9 mg/dL (ref 8.9–10.3)
Chloride: 106 mmol/L (ref 98–111)
Creatinine, Ser: 0.66 mg/dL (ref 0.44–1.00)
GFR, Estimated: >60 mL/min (ref >60)
Glucose, Bld: 114 mg/dL — ABNORMAL HIGH (ref 70–99)
Potassium: 4.1 mmol/L (ref 3.5–5.1)
Sodium: 137 mmol/L (ref 135–145)

## 2022-04-06 LAB — CBC
HCT: 36.3 % (ref 36.0–46.0)
Hemoglobin: 12.5 g/dL (ref 12.0–15.0)
MCH: 28.1 pg (ref 26.0–34.0)
MCHC: 34.4 g/dL (ref 30.0–36.0)
MCV: 81.6 fL (ref 80.0–100.0)
Platelets: 158 10*3/uL (ref 150–400)
RBC: 4.45 MIL/uL (ref 3.87–5.11)
RDW: 13.2 % (ref 11.5–15.5)
WBC: 6.7 10*3/uL (ref 4.0–10.5)
nRBC: 0 % (ref 0.0–0.2)

## 2022-04-06 LAB — GLUCOSE, CAPILLARY: Glucose-Capillary: 137 mg/dL — ABNORMAL HIGH (ref 70–99)

## 2022-04-06 LAB — TSH: TSH: 0.846 u[IU]/mL (ref 0.350–4.500)

## 2022-04-06 MED ORDER — ENOXAPARIN SODIUM 40 MG/0.4ML IJ SOSY: 40.0000 mg | PREFILLED_SYRINGE | Freq: Every day | INTRAMUSCULAR | Status: DC

## 2022-04-06 MED ORDER — LEVETIRACETAM 500 MG PO TABS
500.0000 mg | ORAL_TABLET | Freq: Two times a day (BID) | ORAL | 2 refills | Status: DC
  Filled 2022-04-06: qty 60, 30d supply, fill #0

## 2022-04-06 NOTE — Progress Notes (Signed)
Patient discharged to daughter's vehicle via wheelchair. All personal belongings present. Discharge instructions reviewed with patient and daughter [name]). Patient's questions were addressed and answered.

## 2022-04-06 NOTE — Progress Notes (Signed)
vLTM discontinued  No skin breakdown at all skin sites  [facility] notified

## 2022-04-06 NOTE — Discharge Instructions (Addendum)
You were admitted to [facility] due to sudden onset speech disturbance.  Initially thrombolytic was administered due to concern for stroke.  EEG was obtained which showed evidence of epilepsy arising from left anterior temporal region.  MRI brain with and without contrast did not show any evidence of stroke, 3 cm mass was noted in left anterior temporal region concerning for malignancy.  CT chest Abdo pelvis does not show any evidence of malignancy.  He was started on Keppra 500 mg twice daily for seizure.  Neurosurgery Dr. Kathyrn Sheriff will follow up with you regarding potential biopsy and resection next week.  Dr. Mickeal Skinner (neuro oncologist) we will discuss your case in tumor board and follow-up with you.

## 2022-04-06 NOTE — Procedures (Addendum)
Patient Name: Yvonne [name]  MRN: [id number]  Epilepsy Attending: Lora Havens  Referring Physician/Provider:Lora Havens, MD Duration: 04/05/2022 1918 to 04/06/2022 0850   Patient history: 76 year old woman with above past medical history presenting with symptoms consistent with mixed receptive and expressive aphasia.  EEG to evaluate for seizure.   Level of alertness: Awake, asleep   AEDs during EEG study: GBP, LEV   Technical aspects: This EEG study was done with scalp electrodes positioned according to the 10-20 International system of electrode placement. Electrical activity was reviewed with band pass filter of 1-'70Hz'$ , sensitivity of 7 uV/mm, display speed of 88m/sec with a '60Hz'$  notched filter applied as appropriate. EEG data were recorded continuously and digitally stored.  Video monitoring was available and reviewed as appropriate.   Description: The posterior dominant rhythm consists of '8Hz'$  activity of moderate voltage (25-35 uV) seen predominantly in posterior head regions, symmetric and reactive to eye opening and eye closing.  Sleep was characterized by vertex waves, sleep spindles (12 to 14 Hz), maximal frontocentral region.  Periodic pinpoint discharges were noted in left anterior temporal region, maximal F7-T7 at 0.5 to 1 Hz. Intermittent rhythmic 2 to '5Hz'$  theta- delta slowing was also noted in left hemisphere, maximal left anterior temporal region without definite evolution.  Hyperventilation and photic stimulation were not performed.      ABNORMALITY  - Spike, left anterior temporal region - Intermittent rhythmic slow, left hemisphere, maximal left anterior temporal region   IMPRESSION: This study showed evidence of epileptogenicity in left anterior temporal region likely due to underlying structural abnormality.  Additionally there is cortical dysfunction in left hemisphere, maximal left anterior temporal region.  No definite seizures were seen throughout the  recording.   Yvonne [name] OBarbra Sarks

## 2022-04-06 NOTE — Consult Note (Signed)
Chief Complaint   Chief Complaint  Patient presents with   Code Stroke    History of Present Illness  Yvonne [name] is a 76 y.o. female admitted to the intensive care unit after being admitted about 2 days ago with sudden onset of speech difficulty.  Patient was speaking with her daughter and suddenly was unable to produce speech.  She says she recalls the entire event, and was able to think of the words she wanted to say but was unable to say them.  Her daughter is a Marine scientist who works in the operating room at [employer], and with concern for stroke EMS was called.  She was brought in where work-up included CT and CT angiogram, followed by MRI.  During this time, her symptoms essentially resolved.  While imaging studies did not reveal any acute stroke, she was found to harbor a left anterior temporal intraparenchymal tumor and neurosurgical consultation was requested.  At this time, the patient does not report any significant speech difficulty.  No numbness tingling or weakness of the extremities.  She has not had any visual changes.  Of note, she reports a history of hyperthyroidism and hypertension, and dyslipidemia.  She also has a history of diabetes.  These chronic medical conditions appear to be well controlled, with her last A1c approximately 6.5.  She does not report any history of smoking.  She is not on any blood thinners or antiplatelet agents.  There is no oncologic history.  Past Medical History   Past Medical History:  Diagnosis Date   Anxiety    Chest pressure    Degenerative arthritis    Deviated septum    Diabetes mellitus without complication (HCC)    diet controlled   Diabetic neuropathy ([place]) 12/03/2017   DJD (degenerative joint disease) of knee    Family history of bladder cancer    Family history of brain cancer    Family history of breast cancer    Family history of ovarian cancer    Family history of prostate cancer    Family history of stomach cancer     Family history of throat cancer    Family history of uterine cancer    Graves disease    RADIOACTIVE IODINE 1983   H/O hiatal hernia    Heart palpitations    History of diastolic dysfunction    per echo in 2011   Hyperlipidemia    Hypertension    Hypothyroidism    LVH (left ventricular hypertrophy)    per echo in 2011   Mitral valve prolapse    Neuropathy, peripheral    DR. Jannifer Franklin 12/15   Normal nuclear stress test 2011   OSA (obstructive sleep apnea)    Polyneuropathy in other diseases classified elsewhere ([place]) 05/21/2013   Rosacea 2019   MELANOMA CHEST REMOVED IN SITU   Sleep difficulties    Tinnitus     Past Surgical History   Past Surgical History:  Procedure Laterality Date   ABDOMINAL HYSTERECTOMY  2000   TOTAL   APPENDECTOMY  1989   CARDIOVASCULAR STRESS TEST  11/09/2009   EF 75%   CARPAL TUNNEL RELEASE  2002   bilateral   COLONOSCOPY WITH PROPOFOL N/A 10/23/2018   Procedure: COLONOSCOPY WITH PROPOFOL;  Surgeon: [name], MD;  Location: [facility];  Service: Endoscopy;  Laterality: N/A;   ESOPHAGOGASTRODUODENOSCOPY N/A 11/16/2021   Procedure: ESOPHAGOGASTRODUODENOSCOPY (EGD);  Surgeon: [name], MD;  Location: [facility];  Service: Endoscopy;  Laterality:  N/A;   ESOPHAGOGASTRODUODENOSCOPY (EGD) WITH PROPOFOL N/A 10/23/2018   Procedure: ESOPHAGOGASTRODUODENOSCOPY (EGD) WITH PROPOFOL;  Surgeon: [name], MD;  Location: [facility];  Service: Endoscopy;  Laterality: N/A;   EUS N/A 10/23/2018   Procedure: UPPER ENDOSCOPIC ULTRASOUND (EUS) RADIAL;  Surgeon: [name], MD;  Location: [facility];  Service: Endoscopy;  Laterality: N/A;   EUS N/A 11/16/2021   Procedure: UPPER ENDOSCOPIC ULTRASOUND (EUS) RADIAL;  Surgeon: [name], MD;  Location: [facility];  Service: Endoscopy;  Laterality: N/A;   MASS EXCISION Left 06/02/2019   Procedure: EXCISION OF SUBCUTANEOUS ABDOMINAL WALL MASSES LEFT LOWER QUADRANT;  Surgeon: [name],  MD;  Location: [facility];  Service: General;  Laterality: Left;   TONSILLECTOMY  1958   TRANSTHORACIC ECHOCARDIOGRAM  11/01/2009   NORMAL LV FILLING AND DIASTOLIC DYSFUNCTION AND MILD AORTIC SCLEROSIS AND TRACE MITRAL REGURGITATION    Social History   Social History   Tobacco Use   Smoking status: Former    Packs/day: 0.50    Years: 10.00    Total pack years: 5.00    Types: Cigarettes    Quit date: 08/14/1975    Years since quitting: 46.6   Smokeless tobacco: Never  Vaping Use   Vaping Use: Never used  Substance Use Topics   Alcohol use: Yes    Comment: one glass of wine a week   Drug use: No    Medications   Prior to Admission medications   Medication Sig Start Date End Date Taking? Authorizing Provider  acetaminophen (TYLENOL) 500 MG tablet Take 1,000 mg by mouth every 8 (eight) hours as needed for moderate pain.   Yes [provider]  Cholecalciferol (VITAMIN D) 50 MCG (2000 UT) tablet Take 2,000 Units by mouth daily.    Yes [provider]  fluticasone (FLONASE) 50 MCG/ACT nasal spray Place 2 sprays into both nostrils at bedtime.   Yes [provider]  furosemide (LASIX) 20 MG tablet Take 10 mg by mouth daily.   Yes [provider]  gabapentin (NEURONTIN) 300 MG capsule Take 300 mg by mouth at bedtime.    Yes Kathyrn Lass, MD  levothyroxine (SYNTHROID, LEVOTHROID) 100 MCG tablet Take 100 mcg by mouth daily before breakfast.   Yes [provider]  metoprolol succinate (TOPROL-XL) 25 MG 24 hr tablet TAKE 1/2 TABLET BY MOUTH DAILY. 11/26/14  Yes Darlin Coco, MD  metroNIDAZOLE (METROCREAM) 0.75 % cream Apply 1 application topically daily.  12/21/14  Yes [provider]  OZEMPIC, 0.25 OR 0.5 MG/DOSE, 2 MG/3ML SOPN Inject 0.25 mg into the skin once a week. 03/01/22  Yes [provider]  potassium chloride SA (K-DUR,KLOR-CON) 20 MEQ tablet TAKE 1/2 TABLET BY MOUTH DAILY. 09/30/15  Yes Darlin Coco, MD  Prenatal  Vit-Fe Fumarate-FA (PRENATAL MULTIVITAMIN) TABS tablet Take 1 tablet by mouth daily.   Yes [provider]  rosuvastatin (CRESTOR) 5 MG tablet Take 1 tablet (5 mg total) by mouth daily. 07/04/15  Yes Darlin Coco, MD  gabapentin (NEURONTIN) 100 MG capsule Take 1 capsule (100 mg total) by mouth 3 (three) times daily. PRN Patient not taking: Reported on 04/04/2022 08/11/20   Kathrynn Ducking, MD  levETIRAcetam (KEPPRA) 500 MG tablet Take 1 tablet (500 mg total) by mouth 2 (two) times daily. 04/06/22   Lora Havens, MD    Allergies   Allergies  Allergen Reactions   Tobramycin Hives   Sulfa Antibiotics     Stomach upset   Erythromycin  Nausea Only    Review of Systems  ROS  Neurologic Exam  Awake, alert, oriented Memory and concentration grossly intact Speech fluent, appropriate CN grossly intact Motor exam: Upper Extremities Deltoid Bicep Tricep Grip  Right 5/5 5/5 5/5 5/5  Left 5/5 5/5 5/5 5/5   Lower Extremities IP Quad PF DF EHL  Right 5/5 5/5 5/5 5/5 5/5  Left 5/5 5/5 5/5 5/5 5/5   Sensation grossly intact to LT  Imaging  MRI of the brain with and without contrast was personally reviewed.  This demonstrates an approximately 3 cm lesion within the lateral aspect of the left temporal pole.  Lesion appears to be FLAIR hyperintense.  There are small areas of contrast-enhancement within this lesion.  There does not appear to be any involvement of the ventricular ependyma or the mesial temporal structures.  Impression  - 76 y.o. female presenting with seizure related to a left temporal polar lesion.  Presentation and imaging characteristics are suggestive of primary brain tumor.  I have spoken with Dr. Hortense Ramal, I think nonneoplastic etiologies such as demyelinating disease, inflammatory, and autoimmune processes are much less likely.  Therefore given the symptomatic nature with the seizure and lack of diagnosis but likelihood of primary brain tumor I think surgical  resection is indicated.  Plan  -At this point the patient's seizures are well controlled on Keppra.  From my standpoint she is stable for discharge.  I will have my office staff follow-up with her for urgent outpatient appointment to further discuss surgical resection -With the lack of significant mass effect I would not recommend administration of steroids at this time  I have reviewed the imaging findings thus far with the patient and her daughter at bedside.  We have reviewed the possible diagnoses.  I did tell them that she will likely require surgical resection.  All their questions today were answered.  We will plan on urgent outpatient follow-up.   Consuella Lose, MD [facility]

## 2022-04-06 NOTE — Discharge Summary (Signed)
Physician Discharge Summary  Patient ID: Yvonne [name] MRN: [id number] DOB/AGE: 1945-11-11 76 y.o.  Admit date: 04/04/2022 Discharge date: 04/06/2022  Admission Diagnoses: Stroke  Discharge Diagnoses: Suspected primary neoplasm of brain, epilepsy   Discharged Condition: stable  Hospital Course: Ms. Hopwood presented to the emergency room on 04/04/2022 with sudden onset aphasia.  Code stroke was activated and patient received IV thrombolytics.  An EEG was also ordered which showed abundant spikes in left anterior temporal region.  Therefore patient was started on Keppra 500 mg twice daily.  MRI brain with and without contrast was obtained which showed 3 cm masslike signal abnormality in left temporal lobe with small foci of enhancement concerning for primary CNS neoplasm.  CT chest Abdo pelvis with and without contrast did not show any other malignancy.  Neurosurgery was consulted, plan for resection next week with Dr. Kathyrn Sheriff.  Patient will also be presented at tumor board next week by Dr. Mickeal Skinner.  Consults: Neurosurgery  Significant Diagnostic Studies: EEG   Description: The posterior dominant rhythm consists of '8Hz'$  activity of moderate voltage (25-35 uV) seen predominantly in posterior head regions, symmetric and reactive to eye opening and eye closing.  Sleep was characterized by vertex waves, sleep spindles (12 to 14 Hz), maximal frontocentral region.  Periodic pinpoint discharges were noted in left anterior temporal region, maximal F7-T7 at 0.5 to 1 Hz. Intermittent rhythmic 2 to '5Hz'$  theta- delta slowing was also noted in left hemisphere, maximal left anterior temporal region without definite evolution.  Hyperventilation and photic stimulation were not performed.      ABNORMALITY  - Spike, left anterior temporal region - Intermittent rhythmic slow, left hemisphere, maximal left anterior temporal region   IMPRESSION: This study showed evidence of epileptogenicity in left anterior  temporal region likely due to underlying structural abnormality.  Additionally there is cortical dysfunction in left hemisphere, maximal left anterior temporal region.  No definite seizures were seen throughout the recording.  Treatments: Start Keppra 500 mg twice daily  Discharge Exam: Blood pressure (!) 104/55, pulse 74, temperature 98.4 F (36.9 C), temperature source Oral, resp. rate 19, weight 61 kg, SpO2 92 %. General: lying in bed, NAD Neuro: Awake, alert, oriented, following commands, no aphasia, spontaneously moving all 4 extremities in bed.  Disposition: Discharge disposition: 01-Home or Self Care  Discharge Instructions     Diet - low sodium heart healthy   Complete by: As directed    Discharge instructions   Complete by: As directed    If patient has another seizure, call 911 and bring them back to the ED if: A.  The seizure lasts longer than 5 minutes.      B.  The patient doesn't wake shortly after the seizure or has new problems such as difficulty seeing, speaking or moving following the seizure C.  The patient was injured during the seizure D.  The patient has a temperature over 102 F (39C) E.  The patient vomited during the seizure and now is having trouble breathing    During the Seizure   - First, ensure adequate ventilation and place patients on the floor on their left side  Loosen clothing around the neck and ensure the airway is patent. If the patient is clenching the teeth, do not force the mouth open with any object as this can cause severe damage - Remove all items from the surrounding that can be hazardous. The patient may be oblivious to what's happening and may not even know what he or  she is doing. If the patient is confused and wandering, either gently guide him/her away and block access to outside areas - Reassure the individual and be comforting - Call 911. In most cases, the seizure ends before EMS arrives. However, there are cases when seizures may  last over 3 to 5 minutes. Or the individual may have developed breathing difficulties or severe injuries. If a pregnant patient or a person with diabetes develops a seizure, it is prudent to call an ambulance. - Finally, if the patient does not regain full consciousness, then call EMS. Most patients will remain confused for about 45 to 90 minutes after a seizure, so you must use judgment in calling for help. - Avoid restraints but make sure the patient is in a bed with padded side rails - Place the individual in a lateral position with the neck slightly flexed; this will help the saliva drain from the mouth and prevent the tongue from falling backward - Remove all nearby furniture and other hazards from the area - Provide verbal assurance as the individual is regaining consciousness - Provide the patient with privacy if possible - Call for help and start treatment as ordered by the caregiver    After the Seizure (Postictal Stage)   After a seizure, most patients experience confusion, fatigue, muscle pain and/or a headache. Thus, one should permit the individual to sleep. For the next few days, reassurance is essential. Being calm and helping reorient the person is also of importance.   Most seizures are painless and end spontaneously. Seizures are not harmful to others but can lead to complications such as stress on the lungs, brain and the heart. Individuals with prior lung problems may develop labored breathing and respiratory distress.        Driving Restrictions   Complete by: As directed    Per [facility] statutes, patients with seizures are not allowed to drive until they have been seizure-free for six months and cleared by a physician   Increase activity slowly   Complete by: As directed    Other Restrictions   Complete by: As directed    Seizure precautions  Use caution when using heavy equipment or power tools. Avoid working on ladders or at heights. Take showers instead of  baths. Ensure the water temperature is not too high on the home water heater. Do not go swimming alone. Do not lock yourself in a room alone (i.e. bathroom). When caring for infants or small children, sit down when holding, feeding, or changing them to minimize risk of injury to the child in the event you have a seizure. Maintain good sleep hygiene. Avoid alcohol.      Allergies as of 04/06/2022       Reactions   Tobramycin Hives   Sulfa Antibiotics    Stomach upset   Erythromycin Nausea Only        Medication List     TAKE these medications    acetaminophen 500 MG tablet Commonly known as: TYLENOL Take 1,000 mg by mouth every 8 (eight) hours as needed for moderate pain.   fluticasone 50 MCG/ACT nasal spray Commonly known as: FLONASE Place 2 sprays into both nostrils at bedtime.   furosemide 20 MG tablet Commonly known as: LASIX Take 10 mg by mouth daily.   gabapentin 300 MG capsule Commonly known as: NEURONTIN Take 300 mg by mouth at bedtime.   gabapentin 100 MG capsule Commonly known as: NEURONTIN Take 1 capsule (100 mg total) by  mouth 3 (three) times daily. PRN   levETIRAcetam 500 MG tablet Commonly known as: KEPPRA Take 1 tablet (500 mg total) by mouth 2 (two) times daily.   levothyroxine 100 MCG tablet Commonly known as: SYNTHROID Take 100 mcg by mouth daily before breakfast.   metoprolol succinate 25 MG 24 hr tablet Commonly known as: TOPROL-XL TAKE 1/2 TABLET BY MOUTH DAILY.   metroNIDAZOLE 0.75 % cream Commonly known as: METROCREAM Apply 1 application topically daily.   Ozempic (0.25 or 0.5 MG/DOSE) 2 MG/3ML Sopn Generic drug: Semaglutide(0.25 or 0.'5MG'$ /DOS) Inject 0.25 mg into the skin once a week.   potassium chloride SA 20 MEQ tablet Commonly known as: KLOR-CON M TAKE 1/2 TABLET BY MOUTH DAILY.   prenatal multivitamin Tabs tablet Take 1 tablet by mouth daily.   rosuvastatin 5 MG tablet Commonly known as: CRESTOR Take 1 tablet (5 mg total)  by mouth daily.   Vitamin D 50 MCG (2000 UT) tablet Take 2,000 Units by mouth daily.       I have spent a total of 46  minutes with the patient reviewing hospital notes,  test results, labs and examining the patient as well as establishing an assessment and plan that was discussed personally with the patient.  > 50% of time was spent in direct patient care.    Signed: Lora Havens 04/06/2022, 9:01 AM

## 2022-04-09 ENCOUNTER — Telehealth: Payer: Self-pay | Admitting: Cardiovascular Disease

## 2022-04-09 NOTE — Telephone Encounter (Signed)
Will forward message to Dr Myrna Blazer for review .Adonis Housekeeper

## 2022-04-09 NOTE — Telephone Encounter (Signed)
Pt would like Dr. Angelena Form to look at her echo from when she was in the hospital

## 2022-04-10 DIAGNOSIS — D496 Neoplasm of unspecified behavior of brain: Secondary | ICD-10-CM | POA: Diagnosis not present

## 2022-04-10 DIAGNOSIS — G9389 Other specified disorders of brain: Secondary | ICD-10-CM | POA: Diagnosis not present

## 2022-04-10 DIAGNOSIS — G939 Disorder of brain, unspecified: Secondary | ICD-10-CM | POA: Diagnosis not present

## 2022-04-10 NOTE — Telephone Encounter (Signed)
Burnell Blanks, MD  You 23 hours ago (2:22 PM)    Her echo looks ok. Her heart is strong. No valve issues. It looks like she is having a brain tumor resected later this week. No acute concerns about her echo from my end. Chris       Left detailed message on patient's home number ([facility]) of the above information about echo and asked her to call if she needs any other information/assistance.

## 2022-04-11 ENCOUNTER — Telehealth: Payer: Self-pay | Admitting: *Deleted

## 2022-04-11 ENCOUNTER — Other Ambulatory Visit: Payer: Self-pay | Admitting: Radiation Therapy

## 2022-04-11 ENCOUNTER — Other Ambulatory Visit: Payer: Self-pay | Admitting: Neurosurgery

## 2022-04-11 DIAGNOSIS — D496 Neoplasm of unspecified behavior of brain: Secondary | ICD-10-CM | POA: Diagnosis not present

## 2022-04-11 NOTE — Telephone Encounter (Signed)
Spoke with pt.  She hasn't decided if she is proceeding with this group of surgeons and wasn't aware that they was contacting us.  Her and her family are getting 2nd opinions and once she decides on which surgeon she is using, she will have them send over clearance form their practice and she is aware that we will get back in touch with her once we have received it.

## 2022-04-11 NOTE — Telephone Encounter (Signed)
Info noted. Given that pt is not proceeding with this surgeon and is awaiting 2nd opinion, will remove from preop pool. Await request for clearance once she sees new MD. Richardson Dopp, PA-C    04/11/2022 11:48 AM

## 2022-04-11 NOTE — Telephone Encounter (Signed)
   Pre-operative Risk Assessment    Patient Name: Yvonne [name]  DOB: 1945-09-08 MRN: [id number]      Request for Surgical Clearance    Procedure:  LEFT TEMPORAL CRANIOTOMY   Date of Surgery:  Clearance 04/13/22                                 Surgeon:  DR. Forrestine Him Surgeon's Group or Practice Name:  [facility] Phone number:  [phone] Fax number:  [phone]   Type of Clearance Requested:   - Medical    Type of Anesthesia:  General    Additional requests/questions:    Astrid Divine   04/11/2022, 8:36 AM

## 2022-04-11 NOTE — Telephone Encounter (Signed)
   Name: Yvonne [name]  DOB: Mar 19, 1946  MRN: [id number]  Primary Cardiologist: Lauree Chandler, MD   Preoperative team, please contact this patient and set up a phone call appointment for further preoperative risk assessment. Please obtain consent and complete medication review. Thank you for your help.  Can do phone call this PM or tomorrow.   Richardson Dopp, PA-C 04/11/2022, 10:57 AM [place]

## 2022-04-12 ENCOUNTER — Other Ambulatory Visit (HOSPITAL_COMMUNITY): Payer: Self-pay

## 2022-04-17 DIAGNOSIS — E1140 Type 2 diabetes mellitus with diabetic neuropathy, unspecified: Secondary | ICD-10-CM | POA: Diagnosis not present

## 2022-04-17 DIAGNOSIS — R4701 Aphasia: Secondary | ICD-10-CM | POA: Diagnosis not present

## 2022-04-17 DIAGNOSIS — G4733 Obstructive sleep apnea (adult) (pediatric): Secondary | ICD-10-CM | POA: Diagnosis not present

## 2022-04-17 DIAGNOSIS — G9389 Other specified disorders of brain: Secondary | ICD-10-CM | POA: Diagnosis not present

## 2022-04-17 DIAGNOSIS — T380X5A Adverse effect of glucocorticoids and synthetic analogues, initial encounter: Secondary | ICD-10-CM | POA: Diagnosis not present

## 2022-04-17 DIAGNOSIS — D649 Anemia, unspecified: Secondary | ICD-10-CM | POA: Diagnosis not present

## 2022-04-17 DIAGNOSIS — G8918 Other acute postprocedural pain: Secondary | ICD-10-CM | POA: Diagnosis not present

## 2022-04-17 DIAGNOSIS — E039 Hypothyroidism, unspecified: Secondary | ICD-10-CM | POA: Diagnosis not present

## 2022-04-17 DIAGNOSIS — E034 Atrophy of thyroid (acquired): Secondary | ICD-10-CM | POA: Diagnosis not present

## 2022-04-17 DIAGNOSIS — C712 Malignant neoplasm of temporal lobe: Secondary | ICD-10-CM | POA: Diagnosis not present

## 2022-04-17 DIAGNOSIS — E559 Vitamin D deficiency, unspecified: Secondary | ICD-10-CM | POA: Diagnosis not present

## 2022-04-17 DIAGNOSIS — F419 Anxiety disorder, unspecified: Secondary | ICD-10-CM | POA: Diagnosis not present

## 2022-04-17 DIAGNOSIS — Z01818 Encounter for other preprocedural examination: Secondary | ICD-10-CM | POA: Diagnosis not present

## 2022-04-17 DIAGNOSIS — G936 Cerebral edema: Secondary | ICD-10-CM | POA: Diagnosis not present

## 2022-04-17 DIAGNOSIS — E1165 Type 2 diabetes mellitus with hyperglycemia: Secondary | ICD-10-CM | POA: Diagnosis not present

## 2022-04-17 DIAGNOSIS — I10 Essential (primary) hypertension: Secondary | ICD-10-CM | POA: Diagnosis not present

## 2022-04-17 DIAGNOSIS — E0841 Diabetes mellitus due to underlying condition with diabetic mononeuropathy: Secondary | ICD-10-CM | POA: Diagnosis not present

## 2022-04-17 DIAGNOSIS — D496 Neoplasm of unspecified behavior of brain: Secondary | ICD-10-CM | POA: Diagnosis not present

## 2022-04-17 DIAGNOSIS — I341 Nonrheumatic mitral (valve) prolapse: Secondary | ICD-10-CM | POA: Diagnosis not present

## 2022-04-17 DIAGNOSIS — E7800 Pure hypercholesterolemia, unspecified: Secondary | ICD-10-CM | POA: Diagnosis not present

## 2022-04-17 DIAGNOSIS — D72829 Elevated white blood cell count, unspecified: Secondary | ICD-10-CM | POA: Diagnosis not present

## 2022-04-17 DIAGNOSIS — Z9889 Other specified postprocedural states: Secondary | ICD-10-CM | POA: Diagnosis not present

## 2022-04-17 DIAGNOSIS — E785 Hyperlipidemia, unspecified: Secondary | ICD-10-CM | POA: Diagnosis not present

## 2022-04-23 ENCOUNTER — Other Ambulatory Visit (HOSPITAL_COMMUNITY): Payer: Self-pay | Admitting: Neurosurgery

## 2022-04-23 ENCOUNTER — Other Ambulatory Visit: Payer: Self-pay | Admitting: Neurosurgery

## 2022-04-23 DIAGNOSIS — D496 Neoplasm of unspecified behavior of brain: Secondary | ICD-10-CM

## 2022-04-27 ENCOUNTER — Inpatient Hospital Stay: Admit: 2022-04-27 | Payer: Medicare PPO | Admitting: Neurosurgery

## 2022-04-27 DIAGNOSIS — C712 Malignant neoplasm of temporal lobe: Secondary | ICD-10-CM | POA: Diagnosis not present

## 2022-04-27 SURGERY — CRANIOTOMY TUMOR EXCISION
Anesthesia: General | Laterality: Left

## 2022-04-30 ENCOUNTER — Telehealth: Payer: Self-pay | Admitting: Internal Medicine

## 2022-04-30 ENCOUNTER — Telehealth: Payer: Self-pay | Admitting: Radiation Oncology

## 2022-04-30 ENCOUNTER — Other Ambulatory Visit: Payer: Self-pay | Admitting: Radiation Therapy

## 2022-04-30 ENCOUNTER — Inpatient Hospital Stay: Payer: Medicare PPO | Attending: Internal Medicine

## 2022-04-30 DIAGNOSIS — C712 Malignant neoplasm of temporal lobe: Secondary | ICD-10-CM | POA: Insufficient documentation

## 2022-04-30 DIAGNOSIS — C719 Malignant neoplasm of brain, unspecified: Secondary | ICD-10-CM

## 2022-04-30 DIAGNOSIS — Z87891 Personal history of nicotine dependence: Secondary | ICD-10-CM | POA: Insufficient documentation

## 2022-04-30 NOTE — Telephone Encounter (Signed)
Scheduled appt per 9/18 referral. Pt is aware of appt date and time. Pt is aware to arrive 15 mins prior to appt time and to bring and updated insurance card. Pt is aware of appt location.

## 2022-04-30 NOTE — Telephone Encounter (Signed)
Called patient's daughter [name] to schedule patient for a consultation w. Dr. Lisbeth Renshaw. No answer, LVM for a return call.

## 2022-05-01 NOTE — Progress Notes (Signed)
Location/Histology of Brain Tumor: Left Temporal Glioblastoma, IDH-wildtype, CNS WHO grade 4  Patient presented to the ER with stroke like symptoms of mixed receptive and expressive aphasia.  She was given IV thrombolytics   MRI Brain 04/17/2022: There is a region of masslike T2/FLAIR hyperintensity in the anterior left temporal lobe with multiple small foci of nodular contrast enhancement intrinsically. The foci of enhancement appear to be increased in size compared to recent prior exam. A reference conglomerate region of nodular enhancement measures 1.9 x 0.8 cm, previously 1.2 x 0.6 cm (series 3 image 71). The overall size of FLAIR hyperintensity is unchanged. No hemorrhage or large acute cortical infarction.  EEG 04/05/2022: Left anterior temporal spikes.  CT CAP 04/05/2022: No evidence of primary or metastatic disease in the chest, abdomen or pelvis.  MRI Brain 04/05/2022: 3 cm region of masslike signal abnormality in the left temporal lobe with small foci of enhancement, most concerning for primary CNS neoplasm. Demyelinating disease, vasculitis, and infection are less likely considerations.  CTA 04/04/2022: No large vessel occlusion, hemodynamically significant stenosis, or evidence of dissection.  CT Head 04/04/2022: There is no acute intracranial hemorrhage or evidence of acute infarction  Past or anticipated interventions, if any, per neurosurgery:  Yvonne [name] ([place]) -Awake craniotomy for cortical mapping and resection 04/18/2022    Past or anticipated interventions, if any, per medical oncology:  Yvonne [name] 05/03/2022 -We ultimately recommended proceeding with course of intensity modulated radiation therapy and concurrent daily Temozolomide.   -Preliminary start date 05/21/2022  Yvonne [name] 04/27/2022 ([facility]) -XRT with concurrent temozolomide 75 mg/m2 PO daily x 6 weeks. -Yvonne [name] would like to receive treatment close to home and requests a referral to Yvonne [name] at [facility]. -Next MRI/return visit with Yvonne [name] 2-3 weeks after XRT is completed.  -Staples removed 05/02/2022   Dose of Decadron, if applicable: Will complete taper tomorrow 9/22.  Recent neurologic symptoms, if any:  Seizures: None since 04/04/2022.  Taking Keppra, dose reduced to 579m today. Headaches: No Nausea: No Dizziness/ataxia: No Difficulty with hand coordination: No Focal numbness/weakness: Has neuropathy at baseline. Visual deficits/changes: Notes some floaters continue.  Daughter states they have improved a little.  Confusion/Memory deficits: Short term memory loss.  Has some word finding, mixing up words.  Has some repetitive speech.  Speech is clear.      SAFETY ISSUES: Prior radiation? No Pacemaker/ICD? No Possible current pregnancy? Hysterectomy Is the patient on methotrexate? No  Additional Complaints / other details:

## 2022-05-03 ENCOUNTER — Ambulatory Visit
Admission: RE | Admit: 2022-05-03 | Discharge: 2022-05-03 | Disposition: A | Discharge status: Home / Self Care | Payer: Medicare PPO | Source: Ambulatory Visit | Attending: Radiation Oncology | Admitting: Radiation Oncology

## 2022-05-03 ENCOUNTER — Other Ambulatory Visit: Payer: Self-pay

## 2022-05-03 ENCOUNTER — Ambulatory Visit
Admission: RE | Admit: 2022-05-03 | Discharge: 2022-05-03 | Disposition: A | Discharge status: Home / Self Care | Payer: Self-pay | Source: Ambulatory Visit | Attending: Internal Medicine | Admitting: Internal Medicine

## 2022-05-03 ENCOUNTER — Other Ambulatory Visit (HOSPITAL_COMMUNITY): Payer: Self-pay

## 2022-05-03 ENCOUNTER — Encounter: Payer: Self-pay | Admitting: Radiation Oncology

## 2022-05-03 ENCOUNTER — Telehealth: Payer: Self-pay | Admitting: Pharmacy Technician

## 2022-05-03 ENCOUNTER — Telehealth: Payer: Self-pay | Admitting: Pharmacist

## 2022-05-03 ENCOUNTER — Inpatient Hospital Stay: Payer: Medicare PPO | Admitting: Internal Medicine

## 2022-05-03 ENCOUNTER — Encounter: Payer: Self-pay | Admitting: Internal Medicine

## 2022-05-03 VITALS — BP 124/52 | HR 80 | Temp 98.1°F | Resp 16 | Ht 66.0 in | Wt 149.4 lb

## 2022-05-03 VITALS — BP 122/55 | HR 75 | Temp 97.7°F | Resp 16 | Wt 148.3 lb

## 2022-05-03 DIAGNOSIS — Z801 Family history of malignant neoplasm of trachea, bronchus and lung: Secondary | ICD-10-CM | POA: Insufficient documentation

## 2022-05-03 DIAGNOSIS — Z79890 Hormone replacement therapy (postmenopausal): Secondary | ICD-10-CM | POA: Insufficient documentation

## 2022-05-03 DIAGNOSIS — Z8042 Family history of malignant neoplasm of prostate: Secondary | ICD-10-CM | POA: Insufficient documentation

## 2022-05-03 DIAGNOSIS — E119 Type 2 diabetes mellitus without complications: Secondary | ICD-10-CM | POA: Insufficient documentation

## 2022-05-03 DIAGNOSIS — Z79899 Other long term (current) drug therapy: Secondary | ICD-10-CM | POA: Insufficient documentation

## 2022-05-03 DIAGNOSIS — C712 Malignant neoplasm of temporal lobe: Secondary | ICD-10-CM | POA: Insufficient documentation

## 2022-05-03 DIAGNOSIS — I341 Nonrheumatic mitral (valve) prolapse: Secondary | ICD-10-CM | POA: Insufficient documentation

## 2022-05-03 DIAGNOSIS — C719 Malignant neoplasm of brain, unspecified: Secondary | ICD-10-CM

## 2022-05-03 DIAGNOSIS — E1142 Type 2 diabetes mellitus with diabetic polyneuropathy: Secondary | ICD-10-CM | POA: Insufficient documentation

## 2022-05-03 DIAGNOSIS — E785 Hyperlipidemia, unspecified: Secondary | ICD-10-CM | POA: Insufficient documentation

## 2022-05-03 DIAGNOSIS — Z87891 Personal history of nicotine dependence: Secondary | ICD-10-CM | POA: Diagnosis not present

## 2022-05-03 DIAGNOSIS — Z808 Family history of malignant neoplasm of other organs or systems: Secondary | ICD-10-CM | POA: Insufficient documentation

## 2022-05-03 DIAGNOSIS — Z8041 Family history of malignant neoplasm of ovary: Secondary | ICD-10-CM | POA: Insufficient documentation

## 2022-05-03 DIAGNOSIS — Z800 Family history of malignant neoplasm of digestive organs: Secondary | ICD-10-CM | POA: Insufficient documentation

## 2022-05-03 DIAGNOSIS — Z803 Family history of malignant neoplasm of breast: Secondary | ICD-10-CM | POA: Insufficient documentation

## 2022-05-03 DIAGNOSIS — Z79630 Long term (current) use of alkylating agent: Secondary | ICD-10-CM | POA: Insufficient documentation

## 2022-05-03 DIAGNOSIS — Z8052 Family history of malignant neoplasm of bladder: Secondary | ICD-10-CM | POA: Insufficient documentation

## 2022-05-03 DIAGNOSIS — E039 Hypothyroidism, unspecified: Secondary | ICD-10-CM | POA: Insufficient documentation

## 2022-05-03 DIAGNOSIS — G4733 Obstructive sleep apnea (adult) (pediatric): Secondary | ICD-10-CM | POA: Insufficient documentation

## 2022-05-03 DIAGNOSIS — I10 Essential (primary) hypertension: Secondary | ICD-10-CM | POA: Insufficient documentation

## 2022-05-03 MED ORDER — ONDANSETRON HCL 8 MG PO TABS
8.0000 mg | ORAL_TABLET | Freq: Three times a day (TID) | ORAL | 1 refills | Status: DC | PRN
  Filled 2022-05-03 – 2022-05-14 (×2): qty 30, 10d supply, fill #0
  Filled 2022-07-26: qty 30, 10d supply, fill #1

## 2022-05-03 MED ORDER — TEMOZOLOMIDE 100 MG PO CAPS
100.0000 mg | ORAL_CAPSULE | Freq: Every day | ORAL | 0 refills | Status: DC
  Filled 2022-05-03: qty 42, 42d supply, fill #0
  Filled 2022-05-14: qty 28, 28d supply, fill #0
  Filled 2022-06-13: qty 14, 14d supply, fill #1

## 2022-05-03 MED ORDER — TEMOZOLOMIDE 5 MG PO CAPS
10.0000 mg | ORAL_CAPSULE | Freq: Every day | ORAL | 0 refills | Status: DC
  Filled 2022-05-03: qty 84, 42d supply, fill #0
  Filled 2022-05-14: qty 56, 28d supply, fill #0
  Filled 2022-06-13: qty 28, 14d supply, fill #1

## 2022-05-03 MED ORDER — TEMOZOLOMIDE 20 MG PO CAPS
20.0000 mg | ORAL_CAPSULE | Freq: Every day | ORAL | 0 refills | Status: DC
  Filled 2022-05-03: qty 42, 42d supply, fill #0
  Filled 2022-05-14: qty 28, 28d supply, fill #0
  Filled 2022-06-13: qty 14, 14d supply, fill #1

## 2022-05-03 NOTE — Progress Notes (Signed)

## 2022-05-03 NOTE — Telephone Encounter (Signed)
Oral Oncology Patient Advocate Encounter  Prior Authorization for Temozolomide has been approved.  PA# [id number] Effective dates: 05/03/2022 through 10/31/2022  Patients co-pays: '100mg'$  for 28 days $50  '20mg'$  for 28 days $50  '5mg'$  for 28 days $50   Total cost for full 42 day cycle $300  Lady Deutscher, CPhT-Adv Oncology Pharmacy Patient [place] Direct Number: [phone]  Fax: [phone]

## 2022-05-03 NOTE — Telephone Encounter (Signed)
Oral Oncology Patient Advocate Encounter   Received notification that prior authorization for Temozolomide is required.   PA submitted on 05/03/2022 Key [id number] Status is pending     Lady Deutscher, CPhT-Adv Oncology Pharmacy Patient [place] Direct Number: [phone]  Fax: [phone]

## 2022-05-03 NOTE — Telephone Encounter (Signed)
Oral Oncology Pharmacist Encounter  Received new prescription for Temodar (temozolomide) for the treatment of glioblastoma in conjunction with radiation, planned duration 42 days.  BMP and CBC from 04/20/22 assessed, no relevant lab abnormalities requiring baseline dose adjustment required at this time.  Current medication list in Epic reviewed, no relevant/significant DDIs with Temodar identified.  Evaluated chart and no patient barriers to medication adherence noted.   Patient agreement for treatment documented in MD note on 05/03/22.  Prescription has been e-scribed to the [facility] for benefits analysis and approval.  Oral Oncology Clinic will continue to follow for insurance authorization, copayment issues, initial counseling and start date.  Leron Croak, PharmD, BCPS, BCOP Hematology/Oncology Clinical Pharmacist [name] [place] [phone] 05/03/2022 1:10 PM

## 2022-05-03 NOTE — Progress Notes (Signed)
 [facility] [address]  [place], [place] [address] [phone]   New Patient Evaluation  Date of Service: 05/03/22 Patient Name: Pricsilla M Haile Patient MRN: [id number] Patient DOB: 08/27/1945 Provider: Zachary K Vaslow, MD  Identifying Statement:  Danyah M Sens is a 76 y.o. female with left temporal glioblastoma who presents for initial consultation and evaluation.    Referring Provider: Feng, Yan, MD [address] [place],  [place] [address]  Oncologic History: Oncology History  Glioblastoma, IDH-wildtype (HCC)  04/18/2022 Surgery   Left temporal craniotomy, resection with Dr. Friedman at [facility].  Path is glioblastoma, IDHwt     Biomarkers:  MGMT Unknown.  IDH 1/2 Wild type.  EGFR Unknown  TERT Unknown   History of Present Illness: The patient's records from the referring physician were obtained and reviewed and the patient interviewed to confirm this HPI.  Mairlyn M Rhinehart presented to medical attention in August 2023 with episode of sudden onset speech impairment.  Initially treated as stroke with TPA, CNS imaging later demonstrated enhancing mass in the left temporal lobe, c/w primary brain tumor.  She underwent resection at [facility] with Dr. Friedman after second opinion there.  Following surgery she experienced some speech impairment; this has improved modestly, but she still experiences difficulty with finding correct words.  Also struggling with short term memory.  She is currently living with her daughter given cognitive limitations, need for some supervision.  No further seizures with the Keppra 500mg twice per day.  Currently on decadron 2mg daily per discharge taper from [facility].    Medications: Current Outpatient Medications on File Prior to Visit  Medication Sig Dispense Refill   acetaminophen (TYLENOL) 500 MG tablet Take 1,000 mg by mouth every 8 (eight) hours as needed for moderate pain.     Cholecalciferol (VITAMIN  D) 50 MCG (2000 UT) tablet Take 2,000 Units by mouth daily.      fluticasone (FLONASE) 50 MCG/ACT nasal spray Place 2 sprays into both nostrils at bedtime.     furosemide (LASIX) 20 MG tablet Take 10 mg by mouth daily.     gabapentin (NEURONTIN) 100 MG capsule Take 1 capsule (100 mg total) by mouth 3 (three) times daily. PRN (Patient not taking: Reported on 04/04/2022) 90 capsule 1   gabapentin (NEURONTIN) 300 MG capsule Take 300 mg by mouth at bedtime.      levETIRAcetam (KEPPRA) 500 MG tablet Take 1 tablet (500 mg total) by mouth 2 (two) times daily. 60 tablet 2   levothyroxine (SYNTHROID, LEVOTHROID) 100 MCG tablet Take 100 mcg by mouth daily before breakfast.     metoprolol succinate (TOPROL-XL) 25 MG 24 hr tablet TAKE 1/2 TABLET BY MOUTH DAILY. 45 tablet 3   metroNIDAZOLE (METROCREAM) 0.75 % cream Apply 1 application topically daily.      OZEMPIC, 0.25 OR 0.5 MG/DOSE, 2 MG/3ML SOPN Inject 0.25 mg into the skin once a week.     potassium chloride SA (K-DUR,KLOR-CON) 20 MEQ tablet TAKE 1/2 TABLET BY MOUTH DAILY. 45 tablet 3   Prenatal Vit-Fe Fumarate-FA (PRENATAL MULTIVITAMIN) TABS tablet Take 1 tablet by mouth daily.     rosuvastatin (CRESTOR) 5 MG tablet Take 1 tablet (5 mg total) by mouth daily. 90 tablet 3   No current facility-administered medications on file prior to visit.    Allergies:  Allergies  Allergen Reactions   Tobramycin Hives   Sulfa Antibiotics     Stomach upset   Erythromycin Nausea Only     Past Medical History:  Past Medical History:  Diagnosis Date   Anxiety    Chest pressure    Degenerative arthritis    Deviated septum    Diabetes mellitus without complication ([place])    diet controlled   Diabetic neuropathy ([place]) 12/03/2017   DJD (degenerative joint disease) of knee    Family history of bladder cancer    Family history of brain cancer    Family history of breast cancer    Family history of ovarian cancer    Family history of prostate cancer    Family  history of stomach cancer    Family history of throat cancer    Family history of uterine cancer    Graves disease    RADIOACTIVE IODINE 1983   H/O hiatal hernia    Heart palpitations    History of diastolic dysfunction    per echo in 2011   Hyperlipidemia    Hypertension    Hypothyroidism    LVH (left ventricular hypertrophy)    per echo in 2011   Mitral valve prolapse    Neuropathy, peripheral    DR. Jannifer Franklin 12/15   Normal nuclear stress test 2011   OSA (obstructive sleep apnea)    Polyneuropathy in other diseases classified elsewhere ([place]) 05/21/2013   Rosacea 2019   MELANOMA CHEST REMOVED IN SITU   Sleep difficulties    Tinnitus    Past Surgical History:  Past Surgical History:  Procedure Laterality Date   ABDOMINAL HYSTERECTOMY  2000   TOTAL   APPENDECTOMY  1989   CARDIOVASCULAR STRESS TEST  11/09/2009   EF 75%   CARPAL TUNNEL RELEASE  2002   bilateral   COLONOSCOPY WITH PROPOFOL N/A 10/23/2018   Procedure: COLONOSCOPY WITH PROPOFOL;  Surgeon: [name], MD;  Location: [facility];  Service: Endoscopy;  Laterality: N/A;   ESOPHAGOGASTRODUODENOSCOPY N/A 11/16/2021   Procedure: ESOPHAGOGASTRODUODENOSCOPY (EGD);  Surgeon: [name], MD;  Location: [facility];  Service: Endoscopy;  Laterality: N/A;   ESOPHAGOGASTRODUODENOSCOPY (EGD) WITH PROPOFOL N/A 10/23/2018   Procedure: ESOPHAGOGASTRODUODENOSCOPY (EGD) WITH PROPOFOL;  Surgeon: [name], MD;  Location: [facility];  Service: Endoscopy;  Laterality: N/A;   EUS N/A 10/23/2018   Procedure: UPPER ENDOSCOPIC ULTRASOUND (EUS) RADIAL;  Surgeon: [name], MD;  Location: [facility];  Service: Endoscopy;  Laterality: N/A;   EUS N/A 11/16/2021   Procedure: UPPER ENDOSCOPIC ULTRASOUND (EUS) RADIAL;  Surgeon: [name], MD;  Location: [facility];  Service: Endoscopy;  Laterality: N/A;   MASS EXCISION Left 06/02/2019   Procedure: EXCISION OF SUBCUTANEOUS ABDOMINAL WALL MASSES LEFT LOWER QUADRANT;   Surgeon: [name], MD;  Location: [facility];  Service: General;  Laterality: Left;   [place]pes ECHOCARDIOGRAM  11/01/2009   NORMAL LV FILLING AND DIASTOLIC DYSFUNCTION AND MILD AORTIC SCLEROSIS AND TRACE MITRAL REGURGITATION   Social History:  Social History   Socioeconomic History   Marital status: Married    Spouse name: Edd Fabian   Number of children: 1   Years of education: COLLEGE   Highest education level: Not on file  Occupational History   Occupation: Retired  Tobacco Use   Smoking status: Former    Packs/day: 0.50    Years: 10.00    Total pack years: 5.00    Types: Cigarettes    Quit date: 08/14/1975    Years since quitting: 46.7   Smokeless tobacco: Never  Vaping Use   Vaping Use: Never used  Substance and Sexual Activity   Alcohol use: Yes    Comment: one glass of wine a week   Drug use: No   Sexual activity: Not on file  Other Topics Concern   Not on file  Social History Narrative   Lives at home w/ her husband, [name].   Right-handed.   Drinks no caffeine but does eat chocolate occasionally.   epworth sleepiness scale = 3 (01/10/16)   Social Determinants of Health   Financial Resource Strain: Not on file  Food Insecurity: Not on file  Transportation Needs: Not on file  Physical Activity: Not on file  Stress: Not on file  Social Connections: Not on file  Intimate Partner Violence: Not on file   Family History:  Family History  Problem Relation Age of Onset   Hypertension Mother    Heart disease Mother    Dementia Mother    Heart disease Father    Arthritis Father    Diabetes Father    Hypertension Father    Pancreatic cancer Brother 47       pancreatic   Other Brother        automobile accident   Throat cancer Maternal Uncle    Brain cancer Paternal Aunt 65       malignant brain cancer   Stroke Maternal Grandmother    Heart disease Maternal Grandfather    Pancreatic cancer Cousin 65       pancreatic (maternal first  cousin)   Pancreatic cancer Cousin 62       pancreatic, smoker (maternal first cousin)   Breast cancer Cousin 52       breast (maternal first cousin)   Ovarian cancer Cousin    Uterine cancer Cousin 67       uterine (paternal first cousin)   Cancer Cousin 75       unknown primary (paternal first cousin)   Bladder Cancer Cousin 76       (paternal first cousin)   Lung cancer Cousin    Throat cancer Cousin    Prostate cancer Cousin        (maternal first cousin)   Stomach cancer Cousin 70       (maternal first cousin)   Cancer Other 49       pancreatic neuroendocrine carcinoma   Colon cancer Neg Hx    Neuropathy Neg Hx     Review of Systems: Constitutional: Doesn't report fevers, chills or abnormal weight loss Eyes: Doesn't report blurriness of vision Ears, nose, mouth, throat, and face: Doesn't report sore throat Respiratory: Doesn't report cough, dyspnea or wheezes Cardiovascular: Doesn't report palpitation, chest discomfort  Gastrointestinal:  Doesn't report nausea, constipation, diarrhea GU: Doesn't report incontinence Skin: Doesn't report skin rashes Neurological: Per HPI Musculoskeletal: Doesn't report joint pain Behavioral/Psych: Doesn't report anxiety  Physical Exam: Vitals:   05/03/22 0956  BP: (!) 122/55  Pulse: 75  Resp: 16  Temp: 97.7 F (36.5 C)  SpO2: 100%   KPS: 70. General: Alert, cooperative, pleasant, in no acute distress Head: Normal EENT: No conjunctival injection or scleral icterus.  Lungs: Resp effort normal Cardiac: Regular rate Abdomen: Non-distended abdomen Skin: No rashes cyanosis or petechiae. Extremities: No clubbing or edema  Neurologic Exam: Mental Status: Awake, alert, attentive to examiner. Oriented to self and environment. Language is fluent but comprehension is markedly impaired.  Anterograde amnesia is noted, mild neglect, elements of agnosia. Cranial Nerves: Visual acuity is grossly normal. Visual fields are full.  Extra-ocular movements intact. No   ptosis. Face is symmetric Motor: Tone and bulk are normal. Power is full in both arms and legs. Reflexes are symmetric, no pathologic reflexes present.  Sensory: Intact to light touch Gait: Normal.   Labs: I have reviewed the data as listed    Component Value Date/Time   NA 137 04/06/2022 0145   K 4.1 04/06/2022 0145   CL 106 04/06/2022 0145   CO2 23 04/06/2022 0145   GLUCOSE 114 (H) 04/06/2022 0145   BUN 7 (L) 04/06/2022 0145   CREATININE 0.66 04/06/2022 0145   CREATININE 0.74 11/15/2020 1210   CREATININE 0.71 01/04/2016 0739   CALCIUM 9.0 04/06/2022 0145   PROT 5.3 (L) 04/04/2022 1714   PROT 6.7 10/03/2021 0918   ALBUMIN 3.4 (L) 04/04/2022 1714   AST 22 04/04/2022 1714   AST 15 11/15/2020 1210   ALT 19 04/04/2022 1714   ALT 21 11/15/2020 1210   ALKPHOS 43 04/04/2022 1714   BILITOT 0.5 04/04/2022 1714   BILITOT 0.4 11/15/2020 1210   GFRNONAA >60 04/06/2022 0145   GFRNONAA >60 11/15/2020 1210   GFRAA >60 10/23/2019 1004   Lab Results  Component Value Date   WBC 6.7 04/06/2022   NEUTROABS 4.0 04/04/2022   HGB 12.5 04/06/2022   HCT 36.3 04/06/2022   MCV 81.6 04/06/2022   PLT 158 04/06/2022    Imaging: Overnight EEG with video  Result Date: 04/06/2022 Lora Havens, MD     04/06/2022  9:34 AM Patient Name: LUCIE FRIEDLANDER MRN: [id number] Epilepsy Attending: Lora Havens Referring Physician/Provider:Lora Havens, MD Duration: 04/05/2022 1918 to 04/06/2022 0850  Patient history: 76 year old woman with above past medical history presenting with symptoms consistent with mixed receptive and expressive aphasia.  EEG to evaluate for seizure.  Level of alertness: Awake, asleep  AEDs during EEG study: GBP, LEV  Technical aspects: This EEG study was done with scalp electrodes positioned according to the 10-20 International system of electrode placement. Electrical activity was reviewed with band pass filter of 1-70Hz, sensitivity of 7  uV/mm, display speed of 1m/sec with a 60Hz notched filter applied as appropriate. EEG data were recorded continuously and digitally stored.  Video monitoring was available and reviewed as appropriate.  Description: The posterior dominant rhythm consists of 8Hz activity of moderate voltage (25-35 uV) seen predominantly in posterior head regions, symmetric and reactive to eye opening and eye closing.  Sleep was characterized by vertex waves, sleep spindles (12 to 14 Hz), maximal frontocentral region.  Periodic pinpoint discharges were noted in left anterior temporal region, maximal F7-T7 at 0.5 to 1 Hz. Intermittent rhythmic 2 to 5Hz theta- delta slowing was also noted in left hemisphere, maximal left anterior temporal region without definite evolution.  Hyperventilation and photic stimulation were not performed.    ABNORMALITY - Spike, left anterior temporal region - Intermittent rhythmic slow, left hemisphere, maximal left anterior temporal region  IMPRESSION: This study showed evidence of epileptogenicity in left anterior temporal region likely due to underlying structural abnormality.  Additionally there is cortical dysfunction in left hemisphere, maximal left anterior temporal region.  No definite seizures were seen throughout the recording.  PLora Havens  CT CHEST ABDOMEN PELVIS W CONTRAST  Result Date: 04/05/2022 CLINICAL DATA:  Brain/CNS neoplasm, staging EXAM: CT CHEST, ABDOMEN, AND PELVIS WITH CONTRAST TECHNIQUE: Multidetector CT imaging of the chest, abdomen and pelvis was performed following the standard protocol during bolus administration of intravenous contrast. RADIATION DOSE REDUCTION: This exam was performed according to the departmental  dose-optimization program which includes automated exposure control, adjustment of the mA and/or kV according to patient size and/or use of iterative reconstruction technique. CONTRAST:  53m OMNIPAQUE IOHEXOL 300 MG/ML  SOLN COMPARISON:  09/25/2016  FINDINGS: CT CHEST FINDINGS Cardiovascular: Heart is normal size. Aorta is normal caliber. Scattered aortic calcifications. Mediastinum/Nodes: No mediastinal, hilar, or axillary adenopathy. Trachea and esophagus are unremarkable. Thyroid unremarkable. Lungs/Pleura: No confluent opacities or effusions. No nodules or masses. Musculoskeletal: Chest wall soft tissues are unremarkable. No acute bony abnormality. CT ABDOMEN PELVIS FINDINGS Hepatobiliary: No focal hepatic abnormality. Gallbladder unremarkable. Pancreas: No focal abnormality or ductal dilatation. Spleen: No focal abnormality.  Normal size. Adrenals/Urinary Tract: Areas of scarring and cortical thinning throughout the right kidney. Small cyst in the upper pole measuring 12 mm, appears benign/simple. No follow-up imaging recommended. No stones or hydronephrosis. Adrenal glands and urinary bladder unremarkable. Stomach/Bowel: Stomach, large and small bowel grossly unremarkable. Vascular/Lymphatic: No evidence of aneurysm or adenopathy. Reproductive: Prior hysterectomy.  No adnexal masses. Other: No free fluid or free air. Musculoskeletal: No acute bony abnormality. IMPRESSION: No acute findings in the chest, abdomen or pelvis. No evidence of primary or metastatic disease in the chest, abdomen or pelvis. Scattered aortic atherosclerosis. Areas of scarring and cortical thinning in the right kidney. Electronically Signed   By: KRolm BaptiseM.D.   On: 04/05/2022 22:13   MR BRAIN W WO CONTRAST  Result Date: 04/05/2022 CLINICAL DATA:  Neuro deficit, acute, stroke suspected. Aphasia. New onset seizure. Abnormal EEG localizing to the left temporal region. EXAM: MRI HEAD WITHOUT AND WITH CONTRAST TECHNIQUE: Multiplanar, multiecho pulse sequences of the brain and surrounding structures were obtained without and with intravenous contrast. CONTRAST:  643mGADAVIST GADOBUTROL 1 MMOL/ML IV SOLN COMPARISON:  Head CT and CTA 04/04/2022 FINDINGS: Brain: There is no evidence  of an acute infarct, intracranial hemorrhage, midline shift, or extra-axial fluid collection. There is an approximately 3 cm region of heterogeneous T2 and FLAIR hyperintensity anteriorly in the left temporal lobe which has masslike and infiltrative features with involvement of both cortex and white matter. Within this area of T2 signal abnormality, there are multiple small foci of nodular and ring like enhancement. There is gyral expansion without other regional mass effect. The brain is normal in signal elsewhere. The ventricles are normal in size. Vascular: Major intracranial vascular flow voids are preserved. Skull and upper cervical spine: Unremarkable bone marrow signal. Sinuses/Orbits: Moderate left sphenoid and mild bilateral maxillary sinus mucosal thickening. Clear mastoid air cells. Other: None. IMPRESSION: 1. 3 cm region of masslike signal abnormality in the left temporal lobe with small foci of enhancement, most concerning for primary CNS neoplasm. Demyelinating disease, vasculitis, and infection are less likely considerations. 2. No acute infarct. Electronically Signed   By: AlLogan Bores.D.   On: 04/05/2022 15:54   ECHOCARDIOGRAM COMPLETE  Result Date: 04/05/2022    ECHOCARDIOGRAM REPORT   Patient Name:   ROSKARLET LYONSate of Exam: 04/05/2022 Medical Rec #:  [id number]      Height:       66.0 in Accession #:    [id number]     Weight:       134.5 lb Date of Birth:  9/01-16-1947       BSA:          1.689 m Patient Age:    [age]         BP:  134/66 mmHg Patient Gender: F                HR:           85 bpm. Exam Location:  Inpatient Procedure: 2D Echo, Cardiac Doppler and Color Doppler Indications:    Stroke  History:        Patient has prior history of Echocardiogram examinations, most                 recent 07/12/2020. Risk Factors:Diabetes, Dyslipidemia and                 Hypertension.  Sonographer:    Ellisa Machuca Referring Phys: [id number] ASHISH ARORA IMPRESSIONS  1.  Nondiagnostic study due to poor echo windows. Grossly, no intracardiac source of embolism, however, due to limited views, recommend TEE for further evaluation if clinically indicated.  2. Left ventricular ejection fraction, by estimation, is 55 to 60%. The left ventricle has normal function. Left ventricular endocardial border not optimally defined to evaluate regional wall motion. There is mild asymmetric left ventricular hypertrophy  of the basal-septal segment. Left ventricular diastolic parameters are consistent with Grade I diastolic dysfunction (impaired relaxation).  3. Right ventricular systolic function was not well visualized. The right ventricular size is not well visualized.  4. The mitral valve is degenerative. Trivial mitral valve regurgitation. Moderate mitral annular calcification.  5. The aortic valve was not well visualized. Aortic valve regurgitation is not visualized. No aortic stenosis is present.  6. The inferior vena cava is normal in size with greater than 50% respiratory variability, suggesting right atrial pressure of 3 mmHg. FINDINGS  Left Ventricle: Left ventricular ejection fraction, by estimation, is 55 to 60%. The left ventricle has normal function. Left ventricular endocardial border not optimally defined to evaluate regional wall motion. The left ventricular internal cavity size was normal in size. There is mild asymmetric left ventricular hypertrophy of the basal-septal segment. Left ventricular diastolic parameters are consistent with Grade I diastolic dysfunction (impaired relaxation). Right Ventricle: The right ventricular size is not well visualized. Right vetricular wall thickness was not well visualized. Right ventricular systolic function was not well visualized. Left Atrium: Left atrial size was normal in size. Right Atrium: Right atrial size was not well visualized. Pericardium: There is no evidence of pericardial effusion. Mitral Valve: The mitral valve is degenerative in  appearance. There is mild thickening of the mitral valve leaflet(s). There is mild calcification of the mitral valve leaflet(s). Moderate mitral annular calcification. Trivial mitral valve regurgitation. Tricuspid Valve: The tricuspid valve is not well visualized. Aortic Valve: The aortic valve was not well visualized. Aortic valve regurgitation is not visualized. No aortic stenosis is present. Aortic valve mean gradient measures 3.0 mmHg. Aortic valve peak gradient measures 5.2 mmHg. Aortic valve area, by VTI measures 1.83 cm. Pulmonic Valve: The pulmonic valve was not assessed. Aorta: The ascending aorta was not well visualized. Venous: The inferior vena cava is normal in size with greater than 50% respiratory variability, suggesting right atrial pressure of 3 mmHg. IAS/Shunts: The atrial septum is grossly normal.  LEFT VENTRICLE PLAX 2D LVIDd:         4.20 cm   Diastology LVIDs:         2.70 cm   LV e' medial:    5.11 cm/s LV PW:         1.00 cm   LV E/e' medial:  13.9 LV IVS:        1.20 cm     LV e' lateral:   5.98 cm/s LVOT diam:     1.90 cm   LV E/e' lateral: 11.9 LV SV:         39 LV SV Index:   23 LVOT Area:     2.84 cm  LEFT ATRIUM             Index LA diam:        3.60 cm 2.13 cm/m LA Vol (A2C):   42.2 ml 24.98 ml/m LA Vol (A4C):   38.0 ml 22.49 ml/m LA Biplane Vol: 40.5 ml 23.97 ml/m  AORTIC VALVE AV Area (Vmax):    1.85 cm AV Area (Vmean):   1.78 cm AV Area (VTI):     1.83 cm AV Vmax:           114.00 cm/s AV Vmean:          77.400 cm/s AV VTI:            0.214 m AV Peak Grad:      5.2 mmHg AV Mean Grad:      3.0 mmHg LVOT Vmax:         74.40 cm/s LVOT Vmean:        48.700 cm/s LVOT VTI:          0.138 m LVOT/AV VTI ratio: 0.64 MITRAL VALVE MV Area (PHT): 3.99 cm    SHUNTS MV Decel Time: 190 msec    Systemic VTI:  0.14 m MV E velocity: 71.00 cm/s  Systemic Diam: 1.90 cm MV A velocity: 96.10 cm/s MV E/A ratio:  0.74 Heather Pemberton MD Electronically signed by Heather Pemberton MD Signature  Date/Time: 04/05/2022/2:38:10 PM    Final    EEG adult  Result Date: 04/05/2022 Yadav, Priyanka O, MD     04/06/2022  8:28 AM Patient Name: Loucinda M Creson MRN: [id number] Epilepsy Attending: Priyanka O Yadav Referring Physician/Provider: Arora, Ashish, MD Date: 04/04/2022 Duration: 22.14 mins Patient history: 75-year-old woman with above past medical history presenting with symptoms consistent with mixed receptive and expressive aphasia.  EEG to evaluate for seizure. Level of alertness: Awake AEDs during EEG study: GBP Technical aspects: This EEG study was done with scalp electrodes positioned according to the 10-20 International system of electrode placement. Electrical activity was reviewed with band pass filter of 1-70Hz, sensitivity of 7 uV/mm, display speed of 30mm/sec with a 60Hz notched filter applied as appropriate. EEG data were recorded continuously and digitally stored.  Video monitoring was available and reviewed as appropriate. Description: The posterior dominant rhythm consists of 8Hz activity of moderate voltage (25-35 uV) seen predominantly in posterior head regions, symmetric and reactive to eye opening and eye closing.  Abundant spikes were noted in left anterior temporal region, maximal F7-T7 which at times appeared quasiperiodic at 0.25 to 1 Hz.  Intermittent rhythmic 2 to 5Hz theta- delta slowing was also noted in left hemisphere, maximal left anterior temporal region without definite evolution.  Hyperventilation and photic stimulation were not performed.   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  Additionally there is cortical dysfunction in left hemisphere, maximal left anterior temporal region.  No definite seizures were seen throughout the recording. If suspicion for ictal activity remains a concern, a  prolonged study should be considered. Dr. Xu was notified. Priyanka O Yadav   CT ANGIO HEAD NECK W WO CM (CODE   STROKE)  Result Date: 04/04/2022 CLINICAL DATA:  Neuro deficit, acute, stroke suspected EXAM: CT ANGIOGRAPHY HEAD AND NECK TECHNIQUE: Multidetector CT imaging of the head and neck was performed using the standard protocol during bolus administration of intravenous contrast. Multiplanar CT image reconstructions and MIPs were obtained to evaluate the vascular anatomy. Carotid stenosis measurements (when applicable) are obtained utilizing NASCET criteria, using the distal internal carotid diameter as the denominator. RADIATION DOSE REDUCTION: This exam was performed according to the departmental dose-optimization program which includes automated exposure control, adjustment of the mA and/or kV according to patient size and/or use of iterative reconstruction technique. CONTRAST:  60 mL Omnipaque 350 COMPARISON:  None Available. FINDINGS: CTA NECK Aortic arch: Unremarkable with trace calcified plaque. Great vessel origins are patent. Right carotid system: Patent.  No stenosis. Left carotid system: Patent.  No stenosis. Vertebral arteries: Patent and codominant.  No stenosis. Skeleton: Facet predominance cervical spine degenerative changes. Other neck: Unremarkable. Upper chest: Included upper lungs are clear. Review of the MIP images confirms the above findings CTA HEAD Anterior circulation: Intracranial internal carotid arteries are patent. Anterior cerebral arteries are patent. Anterior communicating artery is identified. Middle cerebral arteries are patent. Posterior circulation: Intracranial vertebral arteries are patent. Basilar artery is patent. Major cerebellar artery origins are patent. Posterior communicating arteries are not identified. Venous sinuses: Patent as allowed by contrast bolus timing. Review of the MIP images confirms the above findings IMPRESSION: No large vessel occlusion,  hemodynamically significant stenosis, or evidence of dissection. Electronically Signed   By: Macy Mis M.D.   On: 04/04/2022 17:30   CT HEAD CODE STROKE WO CONTRAST  Result Date: 04/04/2022 CLINICAL DATA:  Code stroke.  Aphasia EXAM: CT HEAD WITHOUT CONTRAST TECHNIQUE: Contiguous axial images were obtained from the base of the skull through the vertex without intravenous contrast. RADIATION DOSE REDUCTION: This exam was performed according to the departmental dose-optimization program which includes automated exposure control, adjustment of the mA and/or kV according to patient size and/or use of iterative reconstruction technique. COMPARISON:  None Available. FINDINGS: Brain: There is no acute intracranial hemorrhage, mass effect, or edema. Gray-white differentiation is preserved. There is no extra-axial fluid collection. Ventricles and sulci are within normal limits in size and configuration. Vascular: No hyperdense vessel.There is atherosclerotic calcification at the skull base. Skull: Calvarium is unremarkable. Sinuses/Orbits: No acute finding. Lobular opacification of the left sphenoid sinus. Other: Mastoid air cells are clear. ASPECTS ([place] Stroke Program Early CT Score) - Ganglionic level infarction (caudate, lentiform nuclei, internal capsule, insula, M1-M3 cortex): 7 - Supraganglionic infarction (M4-M6 cortex): 3 Total score (0-10 with 10 being normal): 10 IMPRESSION: There is no acute intracranial hemorrhage or evidence of acute infarction. ASPECT score is 10. These results were communicated to Dr. Rory Percy at 5:18 pm on 04/04/2022 by text page via the [facility] messaging system. Electronically Signed   By: Macy Mis M.D.   On: 04/04/2022 17:19    Pathology: Case Report  Surgical Pathology                                Case: [id number]                                Authorizing Provider:  Baruch Goldmann, MD Collected:           04/18/2022 1523  Ordering Location:      [facility]    Received:            04/18/2022 1532                                     Periop                                                                      Pathologist:           Wang, Shih-Hsiu, MD                                                          Intraop:               Jones, Karra Anne, MD                                                        Specimen:    Brain, Brain Tissue                                                                    DIAGNOSIS    A. Brain tissue, left temporal, resection:   Glioblastoma, IDH-wildtype, CNS WHO grade 4.   Comment: IDH1 R132H is negative by immunohistochemistry. In patients 55 years and older, this immunophenotype almost always indicates IDH-wildtype status.    Electronically signed by Wang, Shih-Hsiu, MD on 04/24/2022 at  1:03 PM  Synoptic Report  CENTRAL NERVOUS SYSTEM  CENTRAL NERVOUS SYSTEM - All Specimens  Protocol posted: 05/03/2021   SPECIMEN     Procedure:    Resection     Specimen Size, Gross Description:    Greatest Dimension (Centimeters): 6.5 cm   TUMOR     Tumor Site:    Brain       :    Cerebral lobes         Precise Location:    Temporal     Tumor Laterality:    Left     Integrated Diagnosis:           :    Glioblastoma, IDH-wildtype     Integrated Histologic Molecular Grade:    CNS WHO grade 4   Clinical Information    Brain MRI (04/17/22): There is a region of masslike T2/FLAIR hyperintensity in the anterior left temporal lobe with multiple small foci of nodular contrast enhancement intrinsically. The foci of enhancement appear to be increased in size compared to recent prior exam. A reference conglomerate region of nodular enhancement measures 1.9 x 0.8 cm, previously   1.2 x 0.6 cm (series 3 image 71). The overall size of FLAIR hyperintensity is unchanged.  No hemorrhage or large acute cortical infarction.    Assessment/Plan Glioblastoma, IDH-wildtype (HCC)  We appreciate the opportunity to  participate in the care of Kanae M Drouillard.  She presents with clinical, radiographic, histologic syndrome c/w left temporal glioblastoma, IDHwt.  We had an extensive conversation with her and her daughter regarding pathology, prognosis, and available treatment pathways.  We are encouraged by good functional status and good quality resection.  Limiting factors will be patient age, language and cognitive impairments.  We ultimately recommended proceeding with course of intensity modulated radiation therapy and concurrent daily Temozolomide.  Radiation will be administered Mon-Fri over 6 weeks, Temodar will be dosed at 75mg/m2 to be given daily over 42 days.  We reviewed side effects of temodar, including fatigue, nausea/vomiting, constipation, and cytopenias.  Informed consent was verbally obtained at bedside to proceed with oral chemotherapy.  Chemotherapy should be held for the following:  ANC less than 1,000  Platelets less than 100,000  LFT or creatinine greater than 2x ULN  If clinical concerns/contraindications develop  Every 2 weeks during radiation, labs will be checked accompanied by a clinical evaluation in the brain tumor clinic.  Will discontinue decadron tomorrow per taper instructions.  Keppra will con't 500mg BID for now.    Should be able to resume metformin for DM once steroids are complete and insulin is withdrawn.  She should resume her CPAP, due for titration which they will schedule.  Screening for potential clinical trials was performed and discussed using eligibility criteria for active protocols at [facility], loc[facility]-regional tertiary centers, as well as national database available on Clinicaltrials.gov.    The patient is not a candidate for a research protocol at this time due to patient preference.   We spent twenty additional minutes teaching regarding the natural history, biology, and historical experience in the treatment of brain tumors. We then discussed in  detail the current recommendations for therapy focusing on the mode of administration, mechanism of action, anticipated toxicities, and quality of life issues associated with this plan. We also provided teaching sheets for the patient to take home as an additional resource.  All questions were answered. The patient knows to call the clinic with any problems, questions or concerns. No barriers to learning were detected.  The total time spent in the encounter was 60 minutes and more than 50% was on counseling and review of test results   Zachary K Vaslow, MD Medical Director of Neuro-Oncology [facility] [place] 05/03/22 9:54 AM 

## 2022-05-04 ENCOUNTER — Other Ambulatory Visit: Payer: Self-pay

## 2022-05-04 DIAGNOSIS — C712 Malignant neoplasm of temporal lobe: Secondary | ICD-10-CM | POA: Diagnosis not present

## 2022-05-04 NOTE — Progress Notes (Addendum)
Radiation Oncology         [phone] ________________________________  Name: Yvonne [name]        MRN: [id number]  Date of Service: 05/03/2022 DOB: 1945-11-12  OE:[id number], Yvonne Haw, MD  Truitt Merle, MD     REFERRING PHYSICIAN: Dr. Mickeal [name]   DIAGNOSIS: The encounter diagnosis was Glioblastoma of temporal lobe Yvonne [name]).   HISTORY OF PRESENT ILLNESS: Yvonne [name] is a 76 y.o. female seen at the request of Dr. Mickeal [name] for a diagnosis of glioblastoma of the left temporal lobe.  The patient had been in her usual state of health however presented to the emergency department with symptoms of expressive aphasia, in August 2023.  This was treated like a stroke but imaging later on 04/05/2022 showed a 3 cm region of masslike signal abnormality in the left temporal lobe with a small foci of enhancement concerning for primary neoplasm.  No evidence of acute infarct was identified.  CT chest abdomen pelvis that day did not identify a primary tumor or evidence of metastatic appearing disease.  She has a daughter who is well acquainted with several of the surgeons in the region.  She had several opinions regarding surgical approach but ultimately underwent craniotomy with resection of her left frontal lobe tumor on 04/18/2022 with Dr. Tommi [name] at [facility]  Pathology confirmed glioblastoma, IDH wild-type.  Postoperative imaging on 04/19/2022 showed expected postsurgical changes but otherwise a gross total resection and no evidence of residual enhancement.  She has been recovering since her surgery, she is supported by family members through the process.  She was seen today by Dr. Mickeal [name] who recommends chemoradiation and is seen in our department to discuss proceeding with this modality of therapy.   PREVIOUS RADIATION THERAPY: No   PAST MEDICAL HISTORY:  Past Medical History:  Diagnosis Date   Anxiety    Chest pressure    Degenerative arthritis    Deviated septum    Diabetes mellitus without complication (HCC)     diet controlled   Diabetic neuropathy ([place]) 12/03/2017   DJD (degenerative joint disease) of knee    Family history of bladder cancer    Family history of brain cancer    Family history of breast cancer    Family history of ovarian cancer    Family history of prostate cancer    Family history of stomach cancer    Family history of throat cancer    Family history of uterine cancer    Graves disease    RADIOACTIVE IODINE 1983   H/O hiatal hernia    Heart palpitations    History of diastolic dysfunction    per echo in 2011   Hyperlipidemia    Hypertension    Hypothyroidism    LVH (left ventricular hypertrophy)    per echo in 2011   Mitral valve prolapse    Neuropathy, peripheral    DR. Jannifer [name] 12/15   Normal nuclear stress test 2011   OSA (obstructive sleep apnea)    Polyneuropathy in other diseases classified elsewhere ([place]) 05/21/2013   Rosacea 2019   MELANOMA CHEST REMOVED IN SITU   Sleep difficulties    Tinnitus        PAST SURGICAL HISTORY: Past Surgical History:  Procedure Laterality Date   ABDOMINAL HYSTERECTOMY  2000   TOTAL   APPENDECTOMY  1989   CARDIOVASCULAR STRESS TEST  11/09/2009   EF 75%   CARPAL TUNNEL RELEASE  2002   bilateral   COLONOSCOPY WITH PROPOFOL N/A 10/23/2018  Procedure: COLONOSCOPY WITH PROPOFOL;  Surgeon: Yvonne Banister, MD;  Location: [facility];  Service: Endoscopy;  Laterality: N/A;   ESOPHAGOGASTRODUODENOSCOPY N/A 11/16/2021   Procedure: ESOPHAGOGASTRODUODENOSCOPY (EGD);  Surgeon: Yvonne Banister, MD;  Location: [facility];  Service: Endoscopy;  Laterality: N/A;   ESOPHAGOGASTRODUODENOSCOPY (EGD) WITH PROPOFOL N/A 10/23/2018   Procedure: ESOPHAGOGASTRODUODENOSCOPY (EGD) WITH PROPOFOL;  Surgeon: Yvonne Banister, MD;  Location: [facility];  Service: Endoscopy;  Laterality: N/A;   EUS N/A 10/23/2018   Procedure: UPPER ENDOSCOPIC ULTRASOUND (EUS) RADIAL;  Surgeon: Yvonne Banister, MD;  Location: [facility];  Service: Endoscopy;   Laterality: N/A;   EUS N/A 11/16/2021   Procedure: UPPER ENDOSCOPIC ULTRASOUND (EUS) RADIAL;  Surgeon: Yvonne Banister, MD;  Location: [facility];  Service: Endoscopy;  Laterality: N/A;   MASS EXCISION Left 06/02/2019   Procedure: EXCISION OF SUBCUTANEOUS ABDOMINAL WALL MASSES LEFT LOWER QUADRANT;  Surgeon: Yvonne Mesa, MD;  Location: [facility];  Service: General;  Laterality: Left;   [place] ECHOCARDIOGRAM  11/01/2009   NORMAL LV FILLING AND DIASTOLIC DYSFUNCTION AND MILD AORTIC SCLEROSIS AND TRACE MITRAL REGURGITATION     FAMILY HISTORY:  Family History  Problem Relation Age of Onset   Hypertension Mother    Heart disease Mother    Dementia Mother    Heart disease Father    Arthritis Father    Diabetes Father    Hypertension Father    Pancreatic cancer Brother 60       pancreatic   Other Brother        automobile accident   Throat cancer Maternal Uncle    Brain cancer Paternal Aunt 36       malignant brain cancer   Stroke Maternal Grandmother    Heart disease Maternal Grandfather    Pancreatic cancer Cousin 14       pancreatic (maternal first cousin)   Pancreatic cancer Cousin 42       pancreatic, smoker (maternal first cousin)   Breast cancer Cousin 83       breast (maternal first cousin)   Ovarian cancer Cousin    Uterine cancer Cousin 108       uterine (paternal first cousin)   Cancer Cousin 10       unknown primary (paternal first cousin)   Bladder Cancer Cousin 31       (paternal first cousin)   Lung cancer Cousin    Throat cancer Cousin    Prostate cancer Cousin        (maternal first cousin)   Stomach cancer Cousin 29       (maternal first cousin)   Cancer Other 25       pancreatic neuroendocrine carcinoma   Colon cancer Neg Hx    Neuropathy Neg Hx      SOCIAL HISTORY:  reports that she quit smoking about 46 years ago. Her smoking use included cigarettes. She has a 5.00 pack-year smoking history. She has never used smokeless  tobacco. She reports current alcohol use. She reports that she does not use drugs.  The patient is married and lives in [place].  She has been staying with her daughter Yvonne Munson.  Her daughter is her surrogate Media planner and is a Marine scientist within the [employer] system in administrative role in [place] care.   ALLERGIES: Tobramycin, Sulfa antibiotics, and Erythromycin   MEDICATIONS:  Current Outpatient Medications  Medication Sig Dispense Refill   Cholecalciferol (VITAMIN D) 50 MCG (2000 UT)  tablet Take 2,000 Units by mouth daily.      gabapentin (NEURONTIN) 300 MG capsule Take 300 mg by mouth at bedtime.      levETIRAcetam (KEPPRA) 500 MG tablet Take 1 tablet (500 mg total) by mouth 2 (two) times daily. 60 tablet 2   levothyroxine (SYNTHROID, LEVOTHROID) 100 MCG tablet Take 100 mcg by mouth daily before breakfast.     ondansetron (ZOFRAN) 8 MG tablet Take 1 tablet (8 mg total) by mouth every 8 (eight) hours as needed for nausea or vomiting. May take 30-60 minutes prior to Temodar administration if nausea/vomiting occurs as needed. 30 tablet 1   OZEMPIC, 0.25 OR 0.5 MG/DOSE, 2 MG/3ML SOPN Inject 0.25 mg into the skin once a week.     rosuvastatin (CRESTOR) 5 MG tablet Take 1 tablet (5 mg total) by mouth daily. 90 tablet 3   temozolomide (TEMODAR) 100 MG capsule Take 1 capsule (100 mg total) by mouth daily. May take on an empty stomach to decrease nausea & vomiting. 42 capsule 0   temozolomide (TEMODAR) 20 MG capsule Take 1 capsule (20 mg total) by mouth daily. May take on an empty stomach to decrease nausea & vomiting. 42 capsule 0   temozolomide (TEMODAR) 5 MG capsule Take 2 capsules (10 mg total) by mouth daily. May take on an empty stomach to decrease nausea & vomiting. 84 capsule 0   acetaminophen (TYLENOL) 500 MG tablet Take 1,000 mg by mouth every 8 (eight) hours as needed for moderate pain. (Patient not taking: Reported on 05/03/2022)     fluticasone (FLONASE) 50 MCG/ACT nasal spray  Place 2 sprays into both nostrils at bedtime. (Patient not taking: Reported on 05/03/2022)     furosemide (LASIX) 20 MG tablet Take 10 mg by mouth daily. (Patient not taking: Reported on 05/03/2022)     metoprolol succinate (TOPROL-XL) 25 MG 24 hr tablet TAKE 1/2 TABLET BY MOUTH DAILY. (Patient not taking: Reported on 05/03/2022) 45 tablet 3   metroNIDAZOLE (METROCREAM) 0.75 % cream Apply 1 application topically daily.  (Patient not taking: Reported on 05/03/2022)     potassium chloride SA (K-DUR,KLOR-CON) 20 MEQ tablet TAKE 1/2 TABLET BY MOUTH DAILY. (Patient not taking: Reported on 05/03/2022) 45 tablet 3   Prenatal Vit-Fe Fumarate-FA (PRENATAL MULTIVITAMIN) TABS tablet Take 1 tablet by mouth daily. (Patient not taking: Reported on 05/03/2022)     No current facility-administered medications for this encounter.     REVIEW OF SYSTEMS: On review of systems, patient reports that she has been doing very well despite her surgery that she recently has been healing from.  Physically she feels like she can do most things and is hopeful to get back to exercising regularly as she is an avid walker.  She has been able to walk but her family has felt more comfortable with her having supervision out of concerns for difficulties with communication.  She has had persistent aphasia.  Her daughter states that initially this resolved following surgery but has gradually been noticeable again.  She feels that she can interpret most of her mother's statements or request.  She describes her mother having some fixation on certain facts or statistics that she is read about related to her disease.  She also has strong convictions of wanting to have her hair colored before the treatment begins which she has been counseled against. No complaints of seizures since 1 minute was felt to have been present at the time of her presentation to the ER however she  is continuing to take [place].  She is tapered off of steroids and is not having  headaches, dizziness or difficulty with hand eye coordination.  She has some floaters that are in her visual fields that may have improved somewhat since surgery.  No other complaints are verbalized.    PHYSICAL EXAM:  Wt Readings from Last 3 Encounters:  05/03/22 149 lb 6.4 oz (67.8 kg)  05/03/22 148 lb 4.8 oz (67.3 kg)  04/04/22 134 lb 7.7 oz (61 kg)   Temp Readings from Last 3 Encounters:  05/03/22 98.1 F (36.7 C) (Temporal)  05/03/22 97.7 F (36.5 C) (Temporal)  04/06/22 98.4 F (36.9 C) (Oral)   BP Readings from Last 3 Encounters:  05/03/22 (!) 124/52  05/03/22 (!) 122/55  04/06/22 (!) 145/80   Pulse Readings from Last 3 Encounters:  05/03/22 80  05/03/22 75  04/06/22 84   0/10  In general this is a well appearing Caucasian female with a well-healed surgical incision site at the base of extending into her hairline that appears well-healed.  No evidence of separation or erythema and is appreciated with general inspection she is in no acute distress.  She's alert and oriented x4 and appropriate throughout the examination. Cardiopulmonary assessment is negative for acute distress and she exhibits normal effort.  She does have word finding difficulties and at times somewhat pressured speech but is able to express herself also with the assistance of her daughter.    ECOG = 1  0 - Asymptomatic (Fully active, able to carry on all predisease activities without restriction)  1 - Symptomatic but completely ambulatory (Restricted in physically strenuous activity but ambulatory and able to carry out work of a light or sedentary nature. For example, light housework, office work)  2 - Symptomatic, <50% in bed during the day (Ambulatory and capable of all self care but unable to carry out any work activities. Up and about more than 50% of waking hours)  3 - Symptomatic, >50% in bed, but not bedbound (Capable of only limited self-care, confined to bed or chair 50% or more of waking  hours)  4 - Bedbound (Completely disabled. Cannot carry on any self-care. Totally confined to bed or chair)  5 - Death   Eustace Pen MM, Creech RH, Tormey DC, et al. [phone]). "Toxicity and response criteria of the [facility]". [place] Oncol. 5 (6): 649-55    LABORATORY DATA:  Lab Results  Component Value Date   WBC 6.7 04/06/2022   HGB 12.5 04/06/2022   HCT 36.3 04/06/2022   MCV 81.6 04/06/2022   PLT 158 04/06/2022   Lab Results  Component Value Date   NA 137 04/06/2022   K 4.1 04/06/2022   CL 106 04/06/2022   CO2 23 04/06/2022   Lab Results  Component Value Date   ALT 19 04/04/2022   AST 22 04/04/2022   ALKPHOS 43 04/04/2022   BILITOT 0.5 04/04/2022      RADIOGRAPHY: Overnight EEG with video  Result Date: 04/06/2022 Lora Havens, MD     04/06/2022  9:34 AM Patient Name: TAKIRAH BINFORD MRN: [id number] Epilepsy Attending: Lora Havens Referring Physician/Provider:Lora Havens, MD Duration: 04/05/2022 1918 to 04/06/2022 0850  Patient history: 76 year old woman with above past medical history presenting with symptoms consistent with mixed receptive and expressive aphasia.  EEG to evaluate for seizure.  Level of alertness: Awake, asleep  AEDs during EEG study: GBP, LEV  Technical aspects: This EEG study was  done with scalp electrodes positioned according to the 10-20 International system of electrode placement. Electrical activity was reviewed with band pass filter of 1-70Hz, sensitivity of 7 uV/mm, display speed of 72m/sec with a 60Hz notched filter applied as appropriate. EEG data were recorded continuously and digitally stored.  Video monitoring was available and reviewed as appropriate.  Description: The posterior dominant rhythm consists of 8Hz activity of moderate voltage (25-35 uV) seen predominantly in posterior head regions, symmetric and reactive to eye opening and eye closing.  Sleep was characterized by vertex waves, sleep spindles (12 to  14 Hz), maximal frontocentral region.  Periodic pinpoint discharges were noted in left anterior temporal region, maximal F7-T7 at 0.5 to 1 Hz. Intermittent rhythmic 2 to 5Hz theta- delta slowing was also noted in left hemisphere, maximal left anterior temporal region without definite evolution.  Hyperventilation and photic stimulation were not performed.    ABNORMALITY - Spike, left anterior temporal region - Intermittent rhythmic slow, left hemisphere, maximal left anterior temporal region  IMPRESSION: This study showed evidence of epileptogenicity in left anterior temporal region likely due to underlying structural abnormality.  Additionally there is cortical dysfunction in left hemisphere, maximal left anterior temporal region.  No definite seizures were seen throughout the recording.  PLora Havens  CT CHEST ABDOMEN PELVIS W CONTRAST  Result Date: 04/05/2022 CLINICAL DATA:  Brain/CNS neoplasm, staging EXAM: CT CHEST, ABDOMEN, AND PELVIS WITH CONTRAST TECHNIQUE: Multidetector CT imaging of the chest, abdomen and pelvis was performed following the standard protocol during bolus administration of intravenous contrast. RADIATION DOSE REDUCTION: This exam was performed according to the departmental dose-optimization program which includes automated exposure control, adjustment of the mA and/or kV according to patient size and/or use of iterative reconstruction technique. CONTRAST:  8102mOMNIPAQUE IOHEXOL 300 MG/ML  SOLN COMPARISON:  09/25/2016 FINDINGS: CT CHEST FINDINGS Cardiovascular: Heart is normal size. Aorta is normal caliber. Scattered aortic calcifications. Mediastinum/Nodes: No mediastinal, hilar, or axillary adenopathy. Trachea and esophagus are unremarkable. Thyroid unremarkable. Lungs/Pleura: No confluent opacities or effusions. No nodules or masses. Musculoskeletal: Chest wall soft tissues are unremarkable. No acute bony abnormality. CT ABDOMEN PELVIS FINDINGS Hepatobiliary: No focal hepatic  abnormality. Gallbladder unremarkable. Pancreas: No focal abnormality or ductal dilatation. Spleen: No focal abnormality.  Normal size. Adrenals/Urinary Tract: Areas of scarring and cortical thinning throughout the right kidney. Small cyst in the upper pole measuring 12 mm, appears benign/simple. No follow-up imaging recommended. No stones or hydronephrosis. Adrenal glands and urinary bladder unremarkable. Stomach/Bowel: Stomach, large and small bowel grossly unremarkable. Vascular/Lymphatic: No evidence of aneurysm or adenopathy. Reproductive: Prior hysterectomy.  No adnexal masses. Other: No free fluid or free air. Musculoskeletal: No acute bony abnormality. IMPRESSION: No acute findings in the chest, abdomen or pelvis. No evidence of primary or metastatic disease in the chest, abdomen or pelvis. Scattered aortic atherosclerosis. Areas of scarring and cortical thinning in the right kidney. Electronically Signed   By: KeRolm Baptise.D.   On: 04/05/2022 22:13   MR BRAIN W WO CONTRAST  Result Date: 04/05/2022 CLINICAL DATA:  Neuro deficit, acute, stroke suspected. Aphasia. New onset seizure. Abnormal EEG localizing to the left temporal region. EXAM: MRI HEAD WITHOUT AND WITH CONTRAST TECHNIQUE: Multiplanar, multiecho pulse sequences of the brain and surrounding structures were obtained without and with intravenous contrast. CONTRAST:  9m33mADAVIST GADOBUTROL 1 MMOL/ML IV SOLN COMPARISON:  Head CT and CTA 04/04/2022 FINDINGS: Brain: There is no evidence of an acute infarct, intracranial hemorrhage, midline shift, or extra-axial fluid  collection. There is an approximately 3 cm region of heterogeneous T2 and FLAIR hyperintensity anteriorly in the left temporal lobe which has masslike and infiltrative features with involvement of both cortex and white matter. Within this area of T2 signal abnormality, there are multiple small foci of nodular and ring like enhancement. There is gyral expansion without other regional  mass effect. The brain is normal in signal elsewhere. The ventricles are normal in size. Vascular: Major intracranial vascular flow voids are preserved. Skull and upper cervical spine: Unremarkable bone marrow signal. Sinuses/Orbits: Moderate left sphenoid and mild bilateral maxillary sinus mucosal thickening. Clear mastoid air cells. Other: None. IMPRESSION: 1. 3 cm region of masslike signal abnormality in the left temporal lobe with small foci of enhancement, most concerning for primary CNS neoplasm. Demyelinating disease, vasculitis, and infection are less likely considerations. 2. No acute infarct. Electronically Signed   By: Logan Bores M.D.   On: 04/05/2022 15:54   ECHOCARDIOGRAM COMPLETE  Result Date: 04/05/2022    ECHOCARDIOGRAM REPORT   Patient Name:   JENAVIVE LAMBOY Date of Exam: 04/05/2022 Medical Rec #:  [id number]        Height:       66.0 in Accession #:    [id number]       Weight:       134.5 lb Date of Birth:  09-Dec-1945         BSA:          1.689 m Patient Age:    39 years         BP:           134/66 mmHg Patient Gender: F                HR:           85 bpm. Exam Location:  Inpatient Procedure: 2D Echo, Cardiac Doppler and Color Doppler Indications:    Stroke  History:        Patient has prior history of Echocardiogram examinations, most                 recent 07/12/2020. Risk Factors:Diabetes, Dyslipidemia and                 Hypertension.  Sonographer:    Memory Argue Referring Phys: [id number] ASHISH ARORA IMPRESSIONS  1. Nondiagnostic study due to poor echo windows. Grossly, no intracardiac source of embolism, however, due to limited views, recommend TEE for further evaluation if clinically indicated.  2. Left ventricular ejection fraction, by estimation, is 55 to 60%. The left ventricle has normal function. Left ventricular endocardial border not optimally defined to evaluate regional wall motion. There is mild asymmetric left ventricular hypertrophy  of the basal-septal segment. Left  ventricular diastolic parameters are consistent with Grade I diastolic dysfunction (impaired relaxation).  3. Right ventricular systolic function was not well visualized. The right ventricular size is not well visualized.  4. The mitral valve is degenerative. Trivial mitral valve regurgitation. Moderate mitral annular calcification.  5. The aortic valve was not well visualized. Aortic valve regurgitation is not visualized. No aortic stenosis is present.  6. The inferior vena cava is normal in size with greater than 50% respiratory variability, suggesting right atrial pressure of 3 mmHg. FINDINGS  Left Ventricle: Left ventricular ejection fraction, by estimation, is 55 to 60%. The left ventricle has normal function. Left ventricular endocardial border not optimally defined to evaluate regional wall motion. The left ventricular internal cavity size was normal in  size. There is mild asymmetric left ventricular hypertrophy of the basal-septal segment. Left ventricular diastolic parameters are consistent with Grade I diastolic dysfunction (impaired relaxation). Right Ventricle: The right ventricular size is not well visualized. Right vetricular wall thickness was not well visualized. Right ventricular systolic function was not well visualized. Left Atrium: Left atrial size was normal in size. Right Atrium: Right atrial size was not well visualized. Pericardium: There is no evidence of pericardial effusion. Mitral Valve: The mitral valve is degenerative in appearance. There is mild thickening of the mitral valve leaflet(s). There is mild calcification of the mitral valve leaflet(s). Moderate mitral annular calcification. Trivial mitral valve regurgitation. Tricuspid Valve: The tricuspid valve is not well visualized. Aortic Valve: The aortic valve was not well visualized. Aortic valve regurgitation is not visualized. No aortic stenosis is present. Aortic valve mean gradient measures 3.0 mmHg. Aortic valve peak gradient  measures 5.2 mmHg. Aortic valve area, by VTI measures 1.83 cm. Pulmonic Valve: The pulmonic valve was not assessed. Aorta: The ascending aorta was not well visualized. Venous: The inferior vena cava is normal in size with greater than 50% respiratory variability, suggesting right atrial pressure of 3 mmHg. IAS/Shunts: The atrial septum is grossly normal.  LEFT VENTRICLE PLAX 2D LVIDd:         4.20 cm   Diastology LVIDs:         2.70 cm   LV e' medial:    5.11 cm/s LV PW:         1.00 cm   LV E/e' medial:  13.9 LV IVS:        1.20 cm   LV e' lateral:   5.98 cm/s LVOT diam:     1.90 cm   LV E/e' lateral: 11.9 LV SV:         39 LV SV Index:   23 LVOT Area:     2.84 cm  LEFT ATRIUM             Index LA diam:        3.60 cm 2.13 cm/m LA Vol (A2C):   42.2 ml 24.98 ml/m LA Vol (A4C):   38.0 ml 22.49 ml/m LA Biplane Vol: 40.5 ml 23.97 ml/m  AORTIC VALVE AV Area (Vmax):    1.85 cm AV Area (Vmean):   1.78 cm AV Area (VTI):     1.83 cm AV Vmax:           114.00 cm/s AV Vmean:          77.400 cm/s AV VTI:            0.214 m AV Peak Grad:      5.2 mmHg AV Mean Grad:      3.0 mmHg LVOT Vmax:         74.40 cm/s LVOT Vmean:        48.700 cm/s LVOT VTI:          0.138 m LVOT/AV VTI ratio: 0.64 MITRAL VALVE MV Area (PHT): 3.99 cm    SHUNTS MV Decel Time: 190 msec    Systemic VTI:  0.14 m MV E velocity: 71.00 cm/s  Systemic Diam: 1.90 cm MV A velocity: 96.10 cm/s MV E/A ratio:  0.74 Gwyndolyn Kaufman MD Electronically signed by Gwyndolyn Kaufman MD Signature Date/Time: 04/05/2022/2:38:10 PM    Final    EEG adult  Result Date: 04/05/2022 Lora Havens, MD     04/06/2022  8:28 AM Patient Name: KEYLEEN CERRATO MRN: [id number] Epilepsy  Attending: Lora Havens Referring Physician/Provider: Amie Portland, MD Date: 04/04/2022 Duration: 22.14 mins Patient history: 76 year old woman with above past medical history presenting with symptoms consistent with mixed receptive and expressive aphasia.  EEG to evaluate for seizure.  Level of alertness: Awake AEDs during EEG study: GBP Technical aspects: This EEG study was done with scalp electrodes positioned according to the 10-20 International system of electrode placement. Electrical activity was reviewed with band pass filter of 1-70Hz, sensitivity of 7 uV/mm, display speed of 62m/sec with a 60Hz notched filter applied as appropriate. EEG data were recorded continuously and digitally stored.  Video monitoring was available and reviewed as appropriate. Description: The posterior dominant rhythm consists of 8Hz activity of moderate voltage (25-35 uV) seen predominantly in posterior head regions, symmetric and reactive to eye opening and eye closing.  Abundant spikes were noted in left anterior temporal region, maximal F7-T7 which at times appeared quasiperiodic at 0.25 to 1 Hz.  Intermittent rhythmic 2 to 5Hz theta- delta slowing was also noted in left hemisphere, maximal left anterior temporal region without definite evolution.  Hyperventilation and photic stimulation were not performed.   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  Additionally there is cortical dysfunction in left hemisphere, maximal left anterior temporal region.  No definite seizures were seen throughout the recording. If suspicion for ictal activity remains a concern, a prolonged study should be considered. Dr. XErlinda Hongwas notified. Priyanka OBarbra Sarks  CT ANGIO HEAD NECK W WO CM (CODE STROKE)  Result Date: 04/04/2022 CLINICAL DATA:  Neuro deficit, acute, stroke suspected EXAM: CT ANGIOGRAPHY HEAD AND NECK TECHNIQUE: Multidetector CT imaging of the head and neck was performed using the standard protocol during bolus administration of intravenous contrast. Multiplanar CT image reconstructions and MIPs were obtained to evaluate the vascular  anatomy. Carotid stenosis measurements (when applicable) are obtained utilizing NASCET criteria, using the distal internal carotid diameter as the denominator. RADIATION DOSE REDUCTION: This exam was performed according to the departmental dose-optimization program which includes automated exposure control, adjustment of the mA and/or kV according to patient size and/or use of iterative reconstruction technique. CONTRAST:  60 mL Omnipaque 350 COMPARISON:  None Available. FINDINGS: CTA NECK Aortic arch: Unremarkable with trace calcified plaque. Great vessel origins are patent. Right carotid system: Patent.  No stenosis. Left carotid system: Patent.  No stenosis. Vertebral arteries: Patent and codominant.  No stenosis. Skeleton: Facet predominance cervical spine degenerative changes. Other neck: Unremarkable. Upper chest: Included upper lungs are clear. Review of the MIP images confirms the above findings CTA HEAD Anterior circulation: Intracranial internal carotid arteries are patent. Anterior cerebral arteries are patent. Anterior communicating artery is identified. Middle cerebral arteries are patent. Posterior circulation: Intracranial vertebral arteries are patent. Basilar artery is patent. Major cerebellar artery origins are patent. Posterior communicating arteries are not identified. Venous sinuses: Patent as allowed by contrast bolus timing. Review of the MIP images confirms the above findings IMPRESSION: No large vessel occlusion, hemodynamically significant stenosis, or evidence of dissection. Electronically Signed   By: PMacy MisM.D.   On: 04/04/2022 17:30   CT HEAD CODE STROKE WO CONTRAST  Result Date: 04/04/2022 CLINICAL DATA:  Code stroke.  Aphasia EXAM: CT HEAD WITHOUT CONTRAST TECHNIQUE: Contiguous axial images were obtained from the base of the skull through the vertex without intravenous contrast. RADIATION DOSE REDUCTION: This exam was  performed according to the departmental  dose-optimization program which includes automated exposure control, adjustment of the mA and/or kV according to patient size and/or use of iterative reconstruction technique. COMPARISON:  None Available. FINDINGS: Brain: There is no acute intracranial hemorrhage, mass effect, or edema. Gray-white differentiation is preserved. There is no extra-axial fluid collection. Ventricles and sulci are within normal limits in size and configuration. Vascular: No hyperdense vessel.There is atherosclerotic calcification at the skull base. Skull: Calvarium is unremarkable. Sinuses/Orbits: No acute finding. Lobular opacification of the left sphenoid sinus. Other: Mastoid air cells are clear. ASPECTS ([place] Stroke Program Early CT Score) - Ganglionic level infarction (caudate, lentiform nuclei, internal capsule, insula, M1-M3 cortex): 7 - Supraganglionic infarction (M4-M6 cortex): 3 Total score (0-10 with 10 being normal): 10 IMPRESSION: There is no acute intracranial hemorrhage or evidence of acute infarction. ASPECT score is 10. These results were communicated to Dr. Rory Percy at 5:18 pm on 04/04/2022 by text page via the [name] messaging system. Electronically Signed   By: Macy Mis M.D.   On: 04/04/2022 17:19       IMPRESSION/PLAN: 1. Glioblastoma of the left temporal lobe. Dr. Lisbeth Renshaw discusses the pathology findings and reviews the nature of primary brain disease and the rationale for chemoradiation.  Fortunately the patient does have good performance status physically and seems to be able to navigate with the assistance of her family through this process.  We discussed that our goals would be for definitive treatment especially given her gross total resection.  Breast disease. We discussed the risks, benefits, short, and long term effects of radiotherapy, as well as the curative intent, and the patient is interested in proceeding. Dr. Lisbeth Renshaw discusses the delivery and logistics of radiotherapy and anticipates a course of  6 weeks of radiotherapy to the left temporal lobe with Temodar chemosensitization.  We have communicated with Dr. Mickeal [name] and plans are to proceed with treatment the week of.  05/21/2022. Written consent is obtained and placed in the chart, a copy was provided to the patient. The patient will be contacted to coordinate treatment planning by our simulation department with IV contrast. 2. Family history of pancreatic cancer.  The patient has also been under surveillance with Dr. Ardis Hughs and Dr. Burr Medico given her family history.  I will reach out to genetic testing to determine if there is any additional testing that would need to be updated given her recent diagnosis of glioblastoma.  In a visit lasting 60 minutes, greater than 50% of the time was spent face to face discussing the patient's condition, in preparation for the discussion, and coordinating the patient's care.   The above documentation reflects my direct findings during this shared patient visit. Please see the separate note by Dr. Lisbeth Renshaw on this date for the remainder of the patient's plan of care.    Carola Rhine, [name]   **Disclaimer: This note was dictated with voice recognition software. Similar sounding words can inadvertently be transcribed and this note may contain transcription errors which may not have been corrected upon publication of note.**

## 2022-05-07 ENCOUNTER — Inpatient Hospital Stay: Payer: Medicare PPO

## 2022-05-07 NOTE — Progress Notes (Signed)
[place] Work  Clinical Social Work was referred by new patient protocol for assessment of psychosocial needs.  Clinical Social Worker contacted caregiver by phone  to offer support and assess for needs.    Yvonne [name], patient's daughter, stated Yvonne [name] is doing well.  Her speech is impaired and they are waiting for chemotherapy and radiation to begin.  Yvonne [name] lives with her daughter and Yvonne [name]'s husband is living in their home.  Yvonne [name] reports they have several family members and friends who are helping them.  Yvonne [name] said she is doing "poorly", however.  She inquired about massages for caregivers.  Mailed her [place] business card, Dietitian and [facility] [facility] calendar.  Encouraged her to contact CSW with any questions.     Margaree Mackintosh, LCSW  Clinical Social Worker [facility]

## 2022-05-08 ENCOUNTER — Encounter: Payer: Self-pay | Admitting: Radiation Oncology

## 2022-05-08 NOTE — Progress Notes (Signed)
I called the patient's daughter [name] to follow-up after discussing the patient case with genetics.  At this time the patient has updated genetics that included negative panel looking at GBM/primary brain diseases and mutations.  Additional family members who could benefit from testing would be the patient's nephew whose father passed away from pancreatic cancer, and a cousin Kieth Brightly who was close to a family member that had a GY N.  When I spoke with genetics however they did encouraged the patient's daughter to follow back up in a few years to see if there have been any updates in the guidelines for testing but at this time they feel that [name] does not need testing herself.

## 2022-05-09 NOTE — Progress Notes (Signed)
Has armband been applied?  Yes  Does patient have an allergy to IV contrast dye?: No   Has patient ever received premedication for IV contrast dye?: n/a   Does patient take metformin?: No  If patient does take metformin when was the last dose: n/a  Date of lab work:  04/20/2022 BUN: 11 CR: 0.7 eGfr: >60    IV site: LAC  Has IV site been added to flowsheet?  Yes  BP 135/65 (BP Location: Left Arm)   Pulse 85   Temp 98.4 F (36.9 C) (Oral)   Resp 16   SpO2 98%

## 2022-05-10 ENCOUNTER — Other Ambulatory Visit: Payer: Self-pay

## 2022-05-10 ENCOUNTER — Ambulatory Visit
Admission: RE | Admit: 2022-05-10 | Discharge: 2022-05-10 | Disposition: A | Discharge status: Home / Self Care | Payer: Medicare PPO | Source: Ambulatory Visit | Attending: Radiation Oncology | Admitting: Radiation Oncology

## 2022-05-10 ENCOUNTER — Ambulatory Visit: Payer: Medicare PPO | Admitting: Radiation Oncology

## 2022-05-10 VITALS — BP 135/65 | HR 85 | Temp 98.4°F | Resp 16

## 2022-05-10 DIAGNOSIS — C712 Malignant neoplasm of temporal lobe: Secondary | ICD-10-CM | POA: Insufficient documentation

## 2022-05-10 DIAGNOSIS — C719 Malignant neoplasm of brain, unspecified: Secondary | ICD-10-CM

## 2022-05-10 DIAGNOSIS — Z510 Encounter for antineoplastic radiation therapy: Secondary | ICD-10-CM | POA: Insufficient documentation

## 2022-05-10 MED ORDER — ACETAMINOPHEN 325 MG PO TABS
325.0000 mg | ORAL_TABLET | Freq: Once | ORAL | Status: AC
  Administered 2022-05-10: 325 mg via ORAL
  Filled 2022-05-10: qty 1

## 2022-05-10 MED ORDER — SODIUM CHLORIDE 0.9% FLUSH
10.0000 mL | Freq: Once | INTRAVENOUS | Status: AC
  Administered 2022-05-10: 10 mL via INTRAVENOUS

## 2022-05-14 ENCOUNTER — Other Ambulatory Visit (HOSPITAL_COMMUNITY): Payer: Self-pay

## 2022-05-14 NOTE — Telephone Encounter (Signed)
Oral Chemotherapy Pharmacist Encounter  I spoke with patient's daughter, Yvonne [name], for overview of: Temodar for the treatment of glioblastoma multiforme in conjunction with radiation, planned duration concomitant phase 42 days of therapy.   Counseled patient's daughter on administration, dosing, side effects, monitoring, drug-food interactions, safe handling, storage, and disposal. Since patient's daughter will be handling the temozolomide, we discussed use of gloves when handling the medication.   Patient will take one Temodar '100mg'$  capsules, one Temodar '20mg'$  capsules, two Temodar 5 mg capsules, 130 mg total daily dose, by mouth once daily, patient may take at bedtime and on an empty stomach to decrease nausea and vomiting.  Patient will take Temodar concurrent with radiation for 42 days straight.  Temodar start date: 05/20/22 PM Radiation start date: 05/21/22  Adverse effects include but are not limited to: nausea, vomiting, anorexia, GI upset, rash, drug fever, and fatigue. Prophylactic Zofran will not be used at initiation of concurrent phase, but will be initiated if nausea develops despite Temodar administration on an empty stomach and at bedtime. If this occurs, patient will take Zofran 8 mg tablet, 1 tablet by mouth 30-60 min prior to Temodar dose to help decrease N/V  Rare but serious adverse effects of pneumocystis pneumonia and secondary malignancy also discussed. PCP prophylaxis will not be initiated at this time, but may be added based on lymphocyte count in the future.  Reviewed importance of keeping a medication schedule and plan for any missed doses. No barriers to medication adherence identified.  Medication reconciliation performed and medication/allergy list updated.  All questions answered.  Patient's daughter voiced understanding and appreciation.   Medication education handout and medication calendar placed in mail for patient and patient's family. Patient's  daughter knows to call the office with questions or concerns. Oral Chemotherapy Clinic phone number provided.   Leron Croak, PharmD, BCPS, [facility] Hematology/Oncology Clinical Pharmacist [name] [phone] 05/14/2022 2:17 PM

## 2022-05-15 ENCOUNTER — Other Ambulatory Visit (HOSPITAL_COMMUNITY): Payer: Self-pay

## 2022-05-16 ENCOUNTER — Other Ambulatory Visit (HOSPITAL_COMMUNITY): Payer: Self-pay

## 2022-05-16 DIAGNOSIS — E039 Hypothyroidism, unspecified: Secondary | ICD-10-CM | POA: Diagnosis not present

## 2022-05-16 DIAGNOSIS — C719 Malignant neoplasm of brain, unspecified: Secondary | ICD-10-CM | POA: Diagnosis not present

## 2022-05-16 DIAGNOSIS — E1140 Type 2 diabetes mellitus with diabetic neuropathy, unspecified: Secondary | ICD-10-CM | POA: Diagnosis not present

## 2022-05-16 DIAGNOSIS — E785 Hyperlipidemia, unspecified: Secondary | ICD-10-CM | POA: Diagnosis not present

## 2022-05-17 ENCOUNTER — Telehealth: Payer: Self-pay

## 2022-05-17 ENCOUNTER — Telehealth: Payer: Self-pay | Admitting: *Deleted

## 2022-05-17 ENCOUNTER — Other Ambulatory Visit (HOSPITAL_COMMUNITY): Payer: Self-pay

## 2022-05-17 ENCOUNTER — Other Ambulatory Visit: Payer: Self-pay | Admitting: Internal Medicine

## 2022-05-17 DIAGNOSIS — C719 Malignant neoplasm of brain, unspecified: Secondary | ICD-10-CM

## 2022-05-17 NOTE — Telephone Encounter (Signed)
Returned call to pt's daughter to let her know Dr Mickeal Skinner said pt can get flu and COVID vaccines now. Informed daughter that Dr Mickeal Skinner will order and speech referral for the patient. Verified with pt's daughter her copay for Temodar. Pt's daughter acknowledged information and verbalized understanding.

## 2022-05-17 NOTE — Telephone Encounter (Signed)
Patient's daughter stated that prior to discharge from hospital they stated that they would order speech therapy.  No orders documented on file so order was placed from my provider.  [facility] unable to take referral.  [facility] was able to accept referral but not able to start for 1 week due to speech therapist having vacation planned.  Advised this was acceptable.  Referral, office notes and demographics routed via [facility] fax to [phone] [facility].

## 2022-05-18 DIAGNOSIS — C712 Malignant neoplasm of temporal lobe: Secondary | ICD-10-CM | POA: Diagnosis not present

## 2022-05-20 DIAGNOSIS — Z8052 Family history of malignant neoplasm of bladder: Secondary | ICD-10-CM | POA: Diagnosis not present

## 2022-05-20 DIAGNOSIS — C712 Malignant neoplasm of temporal lobe: Secondary | ICD-10-CM | POA: Diagnosis not present

## 2022-05-20 DIAGNOSIS — Z85820 Personal history of malignant melanoma of skin: Secondary | ICD-10-CM | POA: Insufficient documentation

## 2022-05-20 DIAGNOSIS — Z79899 Other long term (current) drug therapy: Secondary | ICD-10-CM | POA: Diagnosis not present

## 2022-05-20 DIAGNOSIS — Z923 Personal history of irradiation: Secondary | ICD-10-CM | POA: Insufficient documentation

## 2022-05-20 DIAGNOSIS — I10 Essential (primary) hypertension: Secondary | ICD-10-CM | POA: Insufficient documentation

## 2022-05-20 DIAGNOSIS — Z510 Encounter for antineoplastic radiation therapy: Secondary | ICD-10-CM | POA: Insufficient documentation

## 2022-05-20 DIAGNOSIS — Z87891 Personal history of nicotine dependence: Secondary | ICD-10-CM | POA: Diagnosis not present

## 2022-05-20 DIAGNOSIS — Z803 Family history of malignant neoplasm of breast: Secondary | ICD-10-CM | POA: Insufficient documentation

## 2022-05-20 DIAGNOSIS — Z79890 Hormone replacement therapy (postmenopausal): Secondary | ICD-10-CM | POA: Diagnosis not present

## 2022-05-20 DIAGNOSIS — E039 Hypothyroidism, unspecified: Secondary | ICD-10-CM | POA: Insufficient documentation

## 2022-05-20 DIAGNOSIS — Z8042 Family history of malignant neoplasm of prostate: Secondary | ICD-10-CM | POA: Diagnosis not present

## 2022-05-20 DIAGNOSIS — Z8041 Family history of malignant neoplasm of ovary: Secondary | ICD-10-CM | POA: Insufficient documentation

## 2022-05-20 DIAGNOSIS — E785 Hyperlipidemia, unspecified: Secondary | ICD-10-CM | POA: Diagnosis not present

## 2022-05-20 DIAGNOSIS — Z79630 Long term (current) use of alkylating agent: Secondary | ICD-10-CM | POA: Diagnosis not present

## 2022-05-20 DIAGNOSIS — I341 Nonrheumatic mitral (valve) prolapse: Secondary | ICD-10-CM | POA: Insufficient documentation

## 2022-05-20 DIAGNOSIS — Z9221 Personal history of antineoplastic chemotherapy: Secondary | ICD-10-CM | POA: Insufficient documentation

## 2022-05-20 DIAGNOSIS — G4733 Obstructive sleep apnea (adult) (pediatric): Secondary | ICD-10-CM | POA: Diagnosis not present

## 2022-05-20 DIAGNOSIS — Z801 Family history of malignant neoplasm of trachea, bronchus and lung: Secondary | ICD-10-CM | POA: Insufficient documentation

## 2022-05-21 ENCOUNTER — Other Ambulatory Visit: Payer: Self-pay | Admitting: *Deleted

## 2022-05-21 ENCOUNTER — Inpatient Hospital Stay: Payer: Medicare PPO

## 2022-05-21 ENCOUNTER — Other Ambulatory Visit: Payer: Self-pay

## 2022-05-21 ENCOUNTER — Ambulatory Visit
Admission: RE | Admit: 2022-05-21 | Discharge: 2022-05-21 | Disposition: A | Discharge status: Home / Self Care | Payer: Medicare PPO | Source: Ambulatory Visit | Attending: Radiation Oncology | Admitting: Radiation Oncology

## 2022-05-21 DIAGNOSIS — E039 Hypothyroidism, unspecified: Secondary | ICD-10-CM | POA: Diagnosis not present

## 2022-05-21 DIAGNOSIS — Z23 Encounter for immunization: Secondary | ICD-10-CM

## 2022-05-21 DIAGNOSIS — E785 Hyperlipidemia, unspecified: Secondary | ICD-10-CM | POA: Diagnosis not present

## 2022-05-21 DIAGNOSIS — Z923 Personal history of irradiation: Secondary | ICD-10-CM | POA: Diagnosis not present

## 2022-05-21 DIAGNOSIS — I10 Essential (primary) hypertension: Secondary | ICD-10-CM | POA: Diagnosis not present

## 2022-05-21 DIAGNOSIS — Z9221 Personal history of antineoplastic chemotherapy: Secondary | ICD-10-CM | POA: Diagnosis not present

## 2022-05-21 DIAGNOSIS — G4733 Obstructive sleep apnea (adult) (pediatric): Secondary | ICD-10-CM | POA: Diagnosis not present

## 2022-05-21 DIAGNOSIS — C712 Malignant neoplasm of temporal lobe: Secondary | ICD-10-CM | POA: Diagnosis not present

## 2022-05-21 DIAGNOSIS — I341 Nonrheumatic mitral (valve) prolapse: Secondary | ICD-10-CM | POA: Diagnosis not present

## 2022-05-21 DIAGNOSIS — Z510 Encounter for antineoplastic radiation therapy: Secondary | ICD-10-CM | POA: Diagnosis not present

## 2022-05-21 LAB — RAD ONC ARIA SESSION SUMMARY
Course Elapsed Days: 0
Plan Fractions Treated to Date: 1
Plan Prescribed Dose Per Fraction: 2 Gy
Plan Total Fractions Prescribed: 23
Plan Total Prescribed Dose: 46 Gy
Reference Point Dosage Given to Date: 2 Gy
Reference Point Session Dosage Given: 2 Gy
Session Number: 1

## 2022-05-21 MED ORDER — INFLUENZA VAC A&B SA ADJ QUAD 0.5 ML IM PRSY: 0.5000 mL | PREFILLED_SYRINGE | Freq: Once | INTRAMUSCULAR | Status: DC

## 2022-05-21 MED ORDER — INFLUENZA VAC A&B SA ADJ QUAD 0.5 ML IM PRSY: 0.5000 mL | PREFILLED_SYRINGE | Freq: Once | INTRAMUSCULAR | Status: AC

## 2022-05-22 ENCOUNTER — Other Ambulatory Visit: Payer: Self-pay

## 2022-05-22 ENCOUNTER — Ambulatory Visit
Admission: RE | Admit: 2022-05-22 | Discharge: 2022-05-22 | Disposition: A | Discharge status: Home / Self Care | Payer: Medicare PPO | Source: Ambulatory Visit | Attending: Radiation Oncology | Admitting: Radiation Oncology

## 2022-05-22 DIAGNOSIS — Z9221 Personal history of antineoplastic chemotherapy: Secondary | ICD-10-CM | POA: Diagnosis not present

## 2022-05-22 DIAGNOSIS — I341 Nonrheumatic mitral (valve) prolapse: Secondary | ICD-10-CM | POA: Diagnosis not present

## 2022-05-22 DIAGNOSIS — E039 Hypothyroidism, unspecified: Secondary | ICD-10-CM | POA: Diagnosis not present

## 2022-05-22 DIAGNOSIS — E785 Hyperlipidemia, unspecified: Secondary | ICD-10-CM | POA: Diagnosis not present

## 2022-05-22 DIAGNOSIS — I10 Essential (primary) hypertension: Secondary | ICD-10-CM | POA: Diagnosis not present

## 2022-05-22 DIAGNOSIS — Z510 Encounter for antineoplastic radiation therapy: Secondary | ICD-10-CM | POA: Diagnosis not present

## 2022-05-22 DIAGNOSIS — G4733 Obstructive sleep apnea (adult) (pediatric): Secondary | ICD-10-CM | POA: Diagnosis not present

## 2022-05-22 DIAGNOSIS — C712 Malignant neoplasm of temporal lobe: Secondary | ICD-10-CM | POA: Diagnosis not present

## 2022-05-22 DIAGNOSIS — Z923 Personal history of irradiation: Secondary | ICD-10-CM | POA: Diagnosis not present

## 2022-05-22 LAB — RAD ONC ARIA SESSION SUMMARY
Course Elapsed Days: 1
Plan Fractions Treated to Date: 2
Plan Prescribed Dose Per Fraction: 2 Gy
Plan Total Fractions Prescribed: 23
Plan Total Prescribed Dose: 46 Gy
Reference Point Dosage Given to Date: 4 Gy
Reference Point Session Dosage Given: 2 Gy
Session Number: 2

## 2022-05-23 ENCOUNTER — Other Ambulatory Visit: Payer: Self-pay

## 2022-05-23 ENCOUNTER — Ambulatory Visit
Admission: RE | Admit: 2022-05-23 | Discharge: 2022-05-23 | Disposition: A | Discharge status: Home / Self Care | Payer: Medicare PPO | Source: Ambulatory Visit | Attending: Radiation Oncology | Admitting: Radiation Oncology

## 2022-05-23 DIAGNOSIS — Z510 Encounter for antineoplastic radiation therapy: Secondary | ICD-10-CM | POA: Diagnosis not present

## 2022-05-23 DIAGNOSIS — Z923 Personal history of irradiation: Secondary | ICD-10-CM | POA: Diagnosis not present

## 2022-05-23 DIAGNOSIS — C712 Malignant neoplasm of temporal lobe: Secondary | ICD-10-CM | POA: Diagnosis not present

## 2022-05-23 DIAGNOSIS — Z9221 Personal history of antineoplastic chemotherapy: Secondary | ICD-10-CM | POA: Diagnosis not present

## 2022-05-23 DIAGNOSIS — E785 Hyperlipidemia, unspecified: Secondary | ICD-10-CM | POA: Diagnosis not present

## 2022-05-23 DIAGNOSIS — E039 Hypothyroidism, unspecified: Secondary | ICD-10-CM | POA: Diagnosis not present

## 2022-05-23 DIAGNOSIS — I10 Essential (primary) hypertension: Secondary | ICD-10-CM | POA: Diagnosis not present

## 2022-05-23 DIAGNOSIS — I341 Nonrheumatic mitral (valve) prolapse: Secondary | ICD-10-CM | POA: Diagnosis not present

## 2022-05-23 DIAGNOSIS — G4733 Obstructive sleep apnea (adult) (pediatric): Secondary | ICD-10-CM | POA: Diagnosis not present

## 2022-05-23 LAB — RAD ONC ARIA SESSION SUMMARY
Course Elapsed Days: 2
Plan Fractions Treated to Date: 3
Plan Prescribed Dose Per Fraction: 2 Gy
Plan Total Fractions Prescribed: 23
Plan Total Prescribed Dose: 46 Gy
Reference Point Dosage Given to Date: 6 Gy
Reference Point Session Dosage Given: 2 Gy
Session Number: 3

## 2022-05-24 ENCOUNTER — Ambulatory Visit
Admission: RE | Admit: 2022-05-24 | Discharge: 2022-05-24 | Disposition: A | Discharge status: Home / Self Care | Payer: Medicare PPO | Source: Ambulatory Visit | Attending: Radiation Oncology | Admitting: Radiation Oncology

## 2022-05-24 ENCOUNTER — Other Ambulatory Visit: Payer: Self-pay

## 2022-05-24 ENCOUNTER — Inpatient Hospital Stay (HOSPITAL_BASED_OUTPATIENT_CLINIC_OR_DEPARTMENT_OTHER): Payer: Medicare PPO | Admitting: Internal Medicine

## 2022-05-24 VITALS — BP 144/70 | HR 92 | Temp 97.8°F | Resp 18 | Ht 66.0 in | Wt 150.8 lb

## 2022-05-24 DIAGNOSIS — Z510 Encounter for antineoplastic radiation therapy: Secondary | ICD-10-CM | POA: Diagnosis not present

## 2022-05-24 DIAGNOSIS — Z923 Personal history of irradiation: Secondary | ICD-10-CM | POA: Diagnosis not present

## 2022-05-24 DIAGNOSIS — Z79890 Hormone replacement therapy (postmenopausal): Secondary | ICD-10-CM | POA: Insufficient documentation

## 2022-05-24 DIAGNOSIS — C719 Malignant neoplasm of brain, unspecified: Secondary | ICD-10-CM | POA: Diagnosis not present

## 2022-05-24 DIAGNOSIS — Z803 Family history of malignant neoplasm of breast: Secondary | ICD-10-CM | POA: Insufficient documentation

## 2022-05-24 DIAGNOSIS — Z801 Family history of malignant neoplasm of trachea, bronchus and lung: Secondary | ICD-10-CM | POA: Insufficient documentation

## 2022-05-24 DIAGNOSIS — E785 Hyperlipidemia, unspecified: Secondary | ICD-10-CM | POA: Diagnosis not present

## 2022-05-24 DIAGNOSIS — Z87891 Personal history of nicotine dependence: Secondary | ICD-10-CM | POA: Insufficient documentation

## 2022-05-24 DIAGNOSIS — I10 Essential (primary) hypertension: Secondary | ICD-10-CM | POA: Insufficient documentation

## 2022-05-24 DIAGNOSIS — Z85820 Personal history of malignant melanoma of skin: Secondary | ICD-10-CM | POA: Insufficient documentation

## 2022-05-24 DIAGNOSIS — E039 Hypothyroidism, unspecified: Secondary | ICD-10-CM | POA: Insufficient documentation

## 2022-05-24 DIAGNOSIS — C712 Malignant neoplasm of temporal lobe: Secondary | ICD-10-CM | POA: Diagnosis not present

## 2022-05-24 DIAGNOSIS — Z8052 Family history of malignant neoplasm of bladder: Secondary | ICD-10-CM | POA: Insufficient documentation

## 2022-05-24 DIAGNOSIS — Z8041 Family history of malignant neoplasm of ovary: Secondary | ICD-10-CM | POA: Insufficient documentation

## 2022-05-24 DIAGNOSIS — Z8042 Family history of malignant neoplasm of prostate: Secondary | ICD-10-CM | POA: Insufficient documentation

## 2022-05-24 DIAGNOSIS — Z79630 Long term (current) use of alkylating agent: Secondary | ICD-10-CM | POA: Insufficient documentation

## 2022-05-24 DIAGNOSIS — Z79899 Other long term (current) drug therapy: Secondary | ICD-10-CM | POA: Insufficient documentation

## 2022-05-24 DIAGNOSIS — I341 Nonrheumatic mitral (valve) prolapse: Secondary | ICD-10-CM | POA: Diagnosis not present

## 2022-05-24 DIAGNOSIS — Z9221 Personal history of antineoplastic chemotherapy: Secondary | ICD-10-CM | POA: Diagnosis not present

## 2022-05-24 DIAGNOSIS — G4733 Obstructive sleep apnea (adult) (pediatric): Secondary | ICD-10-CM | POA: Diagnosis not present

## 2022-05-24 LAB — RAD ONC ARIA SESSION SUMMARY
Course Elapsed Days: 3
Plan Fractions Treated to Date: 4
Plan Prescribed Dose Per Fraction: 2 Gy
Plan Total Fractions Prescribed: 23
Plan Total Prescribed Dose: 46 Gy
Reference Point Dosage Given to Date: 8 Gy
Reference Point Session Dosage Given: 2 Gy
Session Number: 4

## 2022-05-24 MED ORDER — SONAFINE EX EMUL
1.0000 | Freq: Once | CUTANEOUS | Status: AC
  Administered 2022-05-24: 1 via TOPICAL

## 2022-05-24 NOTE — Progress Notes (Signed)
[facility], [place] [address] [phone]   Interval Evaluation  Date of Service: 05/24/22 Patient Name: [name] Patient MRN: [id number] Patient DOB: 26-Nov-1945 Provider: Ventura Sellers, MD  Identifying Statement:  Yvonne [name] is a 76 y.o. female with left temporal glioblastoma    Oncologic History: Oncology History  Glioblastoma, IDH-wildtype ([facility])  04/18/2022 Surgery   Left temporal craniotomy, resection with Dr. Tommi Rumps at [facility].  Path is glioblastoma, IDHwt   05/16/2022 -  Chemotherapy   [name] : BRAIN GLIOBLASTOMA Radiation Therapy With Concurrent Temozolomide 75 mg/m2 Daily Followed By Sequential Maintenance Temozolomide x 6-12 cycles       Biomarkers:  MGMT Unknown.  IDH 1/2 Wild type.  EGFR Unknown  TERT Unknown   Interval History: Yvonne [name] presents today for follow up, now in first week of radiation and Temodar.  She and her daughter describe some improvement in energy, cognition speech.  There are some concerns about short term memory, irritable behavior.  No further seizures, no headaches.    H+P (05/03/22) Patient presented to medical attention in August 2023 with episode of sudden onset speech impairment.  Initially treated as stroke with TPA, CNS imaging later demonstrated enhancing mass in the left temporal lobe, c/w primary brain tumor.  She underwent resection at [facility] with Dr. Tommi Rumps after second opinion there.  Following surgery she experienced some speech impairment; this has improved modestly, but she still experiences difficulty with finding correct words.  Also struggling with short term memory.  She is currently living with her daughter given cognitive limitations, need for some supervision.  No further seizures with the Keppra 58m twice per day.  Currently on decadron 225mdaily per discharge taper from [facility]   Medications: Current Outpatient  Medications on File Prior to Visit  Medication Sig Dispense Refill   acetaminophen (TYLENOL) 500 MG tablet Take 1,000 mg by mouth every 8 (eight) hours as needed for moderate pain. (Patient not taking: Reported on 05/03/2022)     Cholecalciferol (VITAMIN D) 50 MCG (2000 UT) tablet Take 2,000 Units by mouth daily.      fluticasone (FLONASE) 50 MCG/ACT nasal spray Place 2 sprays into both nostrils at bedtime. (Patient not taking: Reported on 05/03/2022)     furosemide (LASIX) 20 MG tablet Take 10 mg by mouth daily. (Patient not taking: Reported on 05/03/2022)     gabapentin (NEURONTIN) 300 MG capsule Take 300 mg by mouth at bedtime.      levETIRAcetam (KEPPRA) 500 MG tablet Take 1 tablet (500 mg total) by mouth 2 (two) times daily. 60 tablet 2   levothyroxine (SYNTHROID, LEVOTHROID) 100 MCG tablet Take 100 mcg by mouth daily before breakfast.     metoprolol succinate (TOPROL-XL) 25 MG 24 hr tablet TAKE 1/2 TABLET BY MOUTH DAILY. (Patient not taking: Reported on 05/03/2022) 45 tablet 3   metroNIDAZOLE (METROCREAM) 0.75 % cream Apply 1 application topically daily.  (Patient not taking: Reported on 05/03/2022)     ondansetron (ZOFRAN) 8 MG tablet Take 1 tablet (8 mg total) by mouth every 8 (eight) hours as needed for nausea or vomiting. May take 30-60 minutes prior to Temodar administration if nausea/vomiting occurs as needed. 30 tablet 1   OZEMPIC, 0.25 OR 0.5 MG/DOSE, 2 MG/3ML SOPN Inject 0.25 mg into the skin once a week.     potassium chloride SA (K-DUR,KLOR-CON) 20 MEQ tablet TAKE 1/2 TABLET BY  MOUTH DAILY. (Patient not taking: Reported on 05/03/2022) 45 tablet 3   Prenatal Vit-Fe Fumarate-FA (PRENATAL MULTIVITAMIN) TABS tablet Take 1 tablet by mouth daily. (Patient not taking: Reported on 05/03/2022)     rosuvastatin (CRESTOR) 5 MG tablet Take 1 tablet (5 mg total) by mouth daily. 90 tablet 3   temozolomide (TEMODAR) 100 MG capsule Take 1 capsule (100 mg total) by mouth daily. May take on an empty stomach  to decrease nausea & vomiting. 42 capsule 0   temozolomide (TEMODAR) 20 MG capsule Take 1 capsule (20 mg total) by mouth daily. May take on an empty stomach to decrease nausea & vomiting. 42 capsule 0   temozolomide (TEMODAR) 5 MG capsule Take 2 capsules (10 mg total) by mouth daily. May take on an empty stomach to decrease nausea & vomiting. 84 capsule 0   Current Facility-Administered Medications on File Prior to Visit  Medication Dose Route Frequency Provider Last Rate Last Admin   influenza vaccine adjuvanted (FLUAD) injection 0.5 mL  0.5 mL Intramuscular Once Jahvier Aldea, Acey Lav, MD        Allergies:  Allergies  Allergen Reactions   Tobramycin Hives   Sulfa Antibiotics     Stomach upset   Erythromycin Nausea Only   Past Medical History:  Past Medical History:  Diagnosis Date   Anxiety    Chest pressure    Degenerative arthritis    Deviated septum    Diabetes mellitus without complication (HCC)    diet controlled   Diabetic neuropathy ([place]) 12/03/2017   DJD (degenerative joint disease) of knee    Family history of bladder cancer    Family history of brain cancer    Family history of breast cancer    Family history of ovarian cancer    Family history of prostate cancer    Family history of stomach cancer    Family history of throat cancer    Family history of uterine cancer    Graves disease    RADIOACTIVE IODINE 1983   H/O hiatal hernia    Heart palpitations    History of diastolic dysfunction    per echo in 2011   Hyperlipidemia    Hypertension    Hypothyroidism    LVH (left ventricular hypertrophy)    per echo in 2011   Mitral valve prolapse    Neuropathy, peripheral    DR. Jannifer Franklin 12/15   Normal nuclear stress test 2011   OSA (obstructive sleep apnea)    Polyneuropathy in other diseases classified elsewhere ([place]) 05/21/2013   Rosacea 2019   MELANOMA CHEST REMOVED IN SITU   Sleep difficulties    Tinnitus    Past Surgical History:  Past Surgical History:   Procedure Laterality Date   ABDOMINAL HYSTERECTOMY  2000   TOTAL   APPENDECTOMY  1989   CARDIOVASCULAR STRESS TEST  11/09/2009   EF 75%   CARPAL TUNNEL RELEASE  2002   bilateral   COLONOSCOPY WITH PROPOFOL N/A 10/23/2018   Procedure: COLONOSCOPY WITH PROPOFOL;  Surgeon: [name], MD;  Location: [facility];  Service: Endoscopy;  Laterality: N/A;   ESOPHAGOGASTRODUODENOSCOPY N/A 11/16/2021   Procedure: ESOPHAGOGASTRODUODENOSCOPY (EGD);  Surgeon: [name], MD;  Location: [facility];  Service: Endoscopy;  Laterality: N/A;   ESOPHAGOGASTRODUODENOSCOPY (EGD) WITH PROPOFOL N/A 10/23/2018   Procedure: ESOPHAGOGASTRODUODENOSCOPY (EGD) WITH PROPOFOL;  Surgeon: [name], MD;  Location: [facility];  Service: Endoscopy;  Laterality: N/A;   EUS N/A 10/23/2018   Procedure: UPPER ENDOSCOPIC  ULTRASOUND (EUS) RADIAL;  Surgeon: [name], MD;  Location: [facility];  Service: Endoscopy;  Laterality: N/A;   EUS N/A 11/16/2021   Procedure: UPPER ENDOSCOPIC ULTRASOUND (EUS) RADIAL;  Surgeon: [name], MD;  Location: [facility];  Service: Endoscopy;  Laterality: N/A;   MASS EXCISION Left 06/02/2019   Procedure: EXCISION OF SUBCUTANEOUS ABDOMINAL WALL MASSES LEFT LOWER QUADRANT;  Surgeon: [name], MD;  Location: [facility];  Service: General;  Laterality: Left;   [place] ECHOCARDIOGRAM  11/01/2009   NORMAL LV FILLING AND DIASTOLIC DYSFUNCTION AND MILD AORTIC SCLEROSIS AND TRACE MITRAL REGURGITATION   Social History:  Social History   Socioeconomic History   Marital status: Married    Spouse name: Edd Fabian   Number of children: 1   Years of education: COLLEGE   Highest education level: Not on file  Occupational History   Occupation: Retired  Tobacco Use   Smoking status: Former    Packs/day: 0.50    Years: 10.00    Total pack years: 5.00    Types: Cigarettes    Quit date: 08/14/1975    Years since quitting: 46.8   Smokeless tobacco:  Never  Vaping Use   Vaping Use: Never used  Substance and Sexual Activity   Alcohol use: Yes    Comment: one glass of wine a week   Drug use: No   Sexual activity: Not on file  Other Topics Concern   Not on file  Social History Narrative   Lives at home w/ her husband, Edd Fabian.   Right-handed.   Drinks no caffeine but does eat chocolate occasionally.   epworth sleepiness scale = 3 (01/10/16)   Social Determinants of Health   Financial Resource Strain: Not on file  Food Insecurity: Not on file  Transportation Needs: Not on file  Physical Activity: Not on file  Stress: Not on file  Social Connections: Not on file  Intimate Partner Violence: Not on file   Family History:  Family History  Problem Relation Age of Onset   Hypertension Mother    Heart disease Mother    Dementia Mother    Heart disease Father    Arthritis Father    Diabetes Father    Hypertension Father    Pancreatic cancer Brother 43       pancreatic   Other Brother        automobile accident   Throat cancer Maternal Uncle    Brain cancer Paternal Aunt 65       malignant brain cancer   Stroke Maternal Grandmother    Heart disease Maternal Grandfather    Pancreatic cancer Cousin 4       pancreatic (maternal first cousin)   Pancreatic cancer Cousin 106       pancreatic, smoker (maternal first cousin)   Breast cancer Cousin 24       breast (maternal first cousin)   Ovarian cancer Cousin    Uterine cancer Cousin 26       uterine (paternal first cousin)   Cancer Cousin 62       unknown primary (paternal first cousin)   Bladder Cancer Cousin 29       (paternal first cousin)   Lung cancer Cousin    Throat cancer Cousin    Prostate cancer Cousin        (maternal first cousin)   Stomach cancer Cousin 34       (maternal first cousin)   Cancer Other  49       pancreatic neuroendocrine carcinoma   Colon cancer Neg Hx    Neuropathy Neg Hx     Review of Systems: Constitutional: Doesn't report fevers,  chills or abnormal weight loss Eyes: Doesn't report blurriness of vision Ears, nose, mouth, throat, and face: Doesn't report sore throat Respiratory: Doesn't report cough, dyspnea or wheezes Cardiovascular: Doesn't report palpitation, chest discomfort  Gastrointestinal:  Doesn't report nausea, constipation, diarrhea GU: Doesn't report incontinence Skin: Doesn't report skin rashes Neurological: Per HPI Musculoskeletal: Doesn't report joint pain Behavioral/Psych: Doesn't report anxiety  Physical Exam: Vitals:   05/24/22 1035  BP: (!) 144/70  Pulse: 92  Resp: 18  Temp: 97.8 F (36.6 C)  SpO2: 98%    KPS: 70. General: Alert, cooperative, pleasant, in no acute distress Head: Normal EENT: No conjunctival injection or scleral icterus.  Lungs: Resp effort normal Cardiac: Regular rate Abdomen: Non-distended abdomen Skin: No rashes cyanosis or petechiae. Extremities: No clubbing or edema  Neurologic Exam: Mental Status: Awake, alert, attentive to examiner. Oriented to self and environment. Language is fluent but comprehension is markedly impaired.  Anterograde amnesia is noted, mild neglect, elements of agnosia. Cranial Nerves: Visual acuity is grossly normal. Visual fields are full. Extra-ocular movements intact. No ptosis. Face is symmetric Motor: Tone and bulk are normal. Power is full in both arms and legs. Reflexes are symmetric, no pathologic reflexes present.  Sensory: Intact to light touch Gait: Normal.   Labs: I have reviewed the data as listed    Component Value Date/Time   NA 137 04/06/2022 0145   K 4.1 04/06/2022 0145   CL 106 04/06/2022 0145   CO2 23 04/06/2022 0145   GLUCOSE 114 (H) 04/06/2022 0145   BUN 7 (L) 04/06/2022 0145   CREATININE 0.66 04/06/2022 0145   CREATININE 0.74 11/15/2020 1210   CREATININE 0.71 01/04/2016 0739   CALCIUM 9.0 04/06/2022 0145   PROT 5.3 (L) 04/04/2022 1714   PROT 6.7 10/03/2021 0918   ALBUMIN 3.4 (L) 04/04/2022 1714   AST 22  04/04/2022 1714   AST 15 11/15/2020 1210   ALT 19 04/04/2022 1714   ALT 21 11/15/2020 1210   ALKPHOS 43 04/04/2022 1714   BILITOT 0.5 04/04/2022 1714   BILITOT 0.4 11/15/2020 1210   GFRNONAA >60 04/06/2022 0145   GFRNONAA >60 11/15/2020 1210   GFRAA >60 10/23/2019 1004   Lab Results  Component Value Date   WBC 6.7 04/06/2022   NEUTROABS 4.0 04/04/2022   HGB 12.5 04/06/2022   HCT 36.3 04/06/2022   MCV 81.6 04/06/2022   PLT 158 04/06/2022    Assessment/Plan Glioblastoma, IDH-wildtype ([place])  LAVRA IMLER is clinically stable today.  She has limited insight into disease process due to anterograde amnesia and dysphasia.  We reviewed coping strategies with her daughter.  Overall response to treatment has been good thus far.    They will consider trial of Lamictal given psychiatric side effects of Keppra.  We ultimately recommended continuing with course of intensity modulated radiation therapy and concurrent daily Temozolomide.  Radiation will be administered Mon-Fri over 6 weeks, Temodar will be dosed at 42m/m2 to be given daily over 42 days.  We reviewed side effects of temodar, including fatigue, nausea/vomiting, constipation, and cytopenias.  Chemotherapy should be held for the following:  ANC less than 1,000  Platelets less than 100,000  LFT or creatinine greater than 2x ULN  If clinical concerns/contraindications develop  Every 2 weeks during radiation, labs will be checked  accompanied by a clinical evaluation in the brain tumor clinic.  Keppra will con't 537m BID for now.    She should resume her CPAP, due for titration which they will schedule.  All questions were answered. The patient knows to call the clinic with any problems, questions or concerns. No barriers to learning were detected.  The total time spent in the encounter was 30 minutes and more than 50% was on counseling and review of test results   ZVentura Sellers MD Medical Director of  [facility] [place]10/12/23 10:30 AM

## 2022-05-25 ENCOUNTER — Ambulatory Visit
Admission: RE | Admit: 2022-05-25 | Discharge: 2022-05-25 | Disposition: A | Discharge status: Home / Self Care | Payer: Medicare PPO | Source: Ambulatory Visit | Attending: Radiation Oncology | Admitting: Radiation Oncology

## 2022-05-25 ENCOUNTER — Other Ambulatory Visit: Payer: Self-pay

## 2022-05-25 DIAGNOSIS — E039 Hypothyroidism, unspecified: Secondary | ICD-10-CM | POA: Diagnosis not present

## 2022-05-25 DIAGNOSIS — G4733 Obstructive sleep apnea (adult) (pediatric): Secondary | ICD-10-CM | POA: Diagnosis not present

## 2022-05-25 DIAGNOSIS — I10 Essential (primary) hypertension: Secondary | ICD-10-CM | POA: Diagnosis not present

## 2022-05-25 DIAGNOSIS — Z510 Encounter for antineoplastic radiation therapy: Secondary | ICD-10-CM | POA: Diagnosis not present

## 2022-05-25 DIAGNOSIS — Z923 Personal history of irradiation: Secondary | ICD-10-CM | POA: Diagnosis not present

## 2022-05-25 DIAGNOSIS — I341 Nonrheumatic mitral (valve) prolapse: Secondary | ICD-10-CM | POA: Diagnosis not present

## 2022-05-25 DIAGNOSIS — C712 Malignant neoplasm of temporal lobe: Secondary | ICD-10-CM | POA: Diagnosis not present

## 2022-05-25 DIAGNOSIS — E785 Hyperlipidemia, unspecified: Secondary | ICD-10-CM | POA: Diagnosis not present

## 2022-05-25 DIAGNOSIS — Z9221 Personal history of antineoplastic chemotherapy: Secondary | ICD-10-CM | POA: Diagnosis not present

## 2022-05-25 LAB — RAD ONC ARIA SESSION SUMMARY
Course Elapsed Days: 4
Plan Fractions Treated to Date: 5
Plan Prescribed Dose Per Fraction: 2 Gy
Plan Total Fractions Prescribed: 23
Plan Total Prescribed Dose: 46 Gy
Reference Point Dosage Given to Date: 10 Gy
Reference Point Session Dosage Given: 2 Gy
Session Number: 5

## 2022-05-28 ENCOUNTER — Ambulatory Visit
Admission: RE | Admit: 2022-05-28 | Discharge: 2022-05-28 | Disposition: A | Discharge status: Home / Self Care | Payer: Medicare PPO | Source: Ambulatory Visit | Attending: Radiation Oncology | Admitting: Radiation Oncology

## 2022-05-28 ENCOUNTER — Other Ambulatory Visit: Payer: Self-pay

## 2022-05-28 DIAGNOSIS — Z923 Personal history of irradiation: Secondary | ICD-10-CM | POA: Diagnosis not present

## 2022-05-28 DIAGNOSIS — G4733 Obstructive sleep apnea (adult) (pediatric): Secondary | ICD-10-CM | POA: Diagnosis not present

## 2022-05-28 DIAGNOSIS — E785 Hyperlipidemia, unspecified: Secondary | ICD-10-CM | POA: Diagnosis not present

## 2022-05-28 DIAGNOSIS — I341 Nonrheumatic mitral (valve) prolapse: Secondary | ICD-10-CM | POA: Diagnosis not present

## 2022-05-28 DIAGNOSIS — I10 Essential (primary) hypertension: Secondary | ICD-10-CM | POA: Diagnosis not present

## 2022-05-28 DIAGNOSIS — Z510 Encounter for antineoplastic radiation therapy: Secondary | ICD-10-CM | POA: Diagnosis not present

## 2022-05-28 DIAGNOSIS — Z9221 Personal history of antineoplastic chemotherapy: Secondary | ICD-10-CM | POA: Diagnosis not present

## 2022-05-28 DIAGNOSIS — E039 Hypothyroidism, unspecified: Secondary | ICD-10-CM | POA: Diagnosis not present

## 2022-05-28 DIAGNOSIS — C712 Malignant neoplasm of temporal lobe: Secondary | ICD-10-CM | POA: Diagnosis not present

## 2022-05-28 LAB — RAD ONC ARIA SESSION SUMMARY
Course Elapsed Days: 7
Plan Fractions Treated to Date: 6
Plan Prescribed Dose Per Fraction: 2 Gy
Plan Total Fractions Prescribed: 23
Plan Total Prescribed Dose: 46 Gy
Reference Point Dosage Given to Date: 12 Gy
Reference Point Session Dosage Given: 2 Gy
Session Number: 6

## 2022-05-29 ENCOUNTER — Ambulatory Visit
Admission: RE | Admit: 2022-05-29 | Discharge: 2022-05-29 | Disposition: A | Discharge status: Home / Self Care | Payer: Medicare PPO | Source: Ambulatory Visit | Attending: Radiation Oncology | Admitting: Radiation Oncology

## 2022-05-29 ENCOUNTER — Other Ambulatory Visit: Payer: Self-pay

## 2022-05-29 DIAGNOSIS — I10 Essential (primary) hypertension: Secondary | ICD-10-CM | POA: Diagnosis not present

## 2022-05-29 DIAGNOSIS — E039 Hypothyroidism, unspecified: Secondary | ICD-10-CM | POA: Diagnosis not present

## 2022-05-29 DIAGNOSIS — Z9221 Personal history of antineoplastic chemotherapy: Secondary | ICD-10-CM | POA: Diagnosis not present

## 2022-05-29 DIAGNOSIS — C712 Malignant neoplasm of temporal lobe: Secondary | ICD-10-CM | POA: Diagnosis not present

## 2022-05-29 DIAGNOSIS — Z923 Personal history of irradiation: Secondary | ICD-10-CM | POA: Diagnosis not present

## 2022-05-29 DIAGNOSIS — E785 Hyperlipidemia, unspecified: Secondary | ICD-10-CM | POA: Diagnosis not present

## 2022-05-29 DIAGNOSIS — I341 Nonrheumatic mitral (valve) prolapse: Secondary | ICD-10-CM | POA: Diagnosis not present

## 2022-05-29 DIAGNOSIS — G4733 Obstructive sleep apnea (adult) (pediatric): Secondary | ICD-10-CM | POA: Diagnosis not present

## 2022-05-29 DIAGNOSIS — Z510 Encounter for antineoplastic radiation therapy: Secondary | ICD-10-CM | POA: Diagnosis not present

## 2022-05-29 LAB — RAD ONC ARIA SESSION SUMMARY
Course Elapsed Days: 8
Plan Fractions Treated to Date: 7
Plan Prescribed Dose Per Fraction: 2 Gy
Plan Total Fractions Prescribed: 23
Plan Total Prescribed Dose: 46 Gy
Reference Point Dosage Given to Date: 14 Gy
Reference Point Session Dosage Given: 2 Gy
Session Number: 7

## 2022-05-30 ENCOUNTER — Other Ambulatory Visit: Payer: Self-pay

## 2022-05-30 ENCOUNTER — Ambulatory Visit
Admission: RE | Admit: 2022-05-30 | Discharge: 2022-05-30 | Disposition: A | Discharge status: Home / Self Care | Payer: Medicare PPO | Source: Ambulatory Visit | Attending: Radiation Oncology | Admitting: Radiation Oncology

## 2022-05-30 DIAGNOSIS — E039 Hypothyroidism, unspecified: Secondary | ICD-10-CM | POA: Diagnosis not present

## 2022-05-30 DIAGNOSIS — Z9221 Personal history of antineoplastic chemotherapy: Secondary | ICD-10-CM | POA: Diagnosis not present

## 2022-05-30 DIAGNOSIS — C712 Malignant neoplasm of temporal lobe: Secondary | ICD-10-CM | POA: Diagnosis not present

## 2022-05-30 DIAGNOSIS — I10 Essential (primary) hypertension: Secondary | ICD-10-CM | POA: Diagnosis not present

## 2022-05-30 DIAGNOSIS — Z510 Encounter for antineoplastic radiation therapy: Secondary | ICD-10-CM | POA: Diagnosis not present

## 2022-05-30 DIAGNOSIS — E785 Hyperlipidemia, unspecified: Secondary | ICD-10-CM | POA: Diagnosis not present

## 2022-05-30 DIAGNOSIS — Z923 Personal history of irradiation: Secondary | ICD-10-CM | POA: Diagnosis not present

## 2022-05-30 DIAGNOSIS — G4733 Obstructive sleep apnea (adult) (pediatric): Secondary | ICD-10-CM | POA: Diagnosis not present

## 2022-05-30 DIAGNOSIS — I341 Nonrheumatic mitral (valve) prolapse: Secondary | ICD-10-CM | POA: Diagnosis not present

## 2022-05-30 LAB — RAD ONC ARIA SESSION SUMMARY
Course Elapsed Days: 9
Plan Fractions Treated to Date: 8
Plan Prescribed Dose Per Fraction: 2 Gy
Plan Total Fractions Prescribed: 23
Plan Total Prescribed Dose: 46 Gy
Reference Point Dosage Given to Date: 16 Gy
Reference Point Session Dosage Given: 2 Gy
Session Number: 8

## 2022-05-31 ENCOUNTER — Other Ambulatory Visit: Payer: Self-pay

## 2022-05-31 ENCOUNTER — Ambulatory Visit: Payer: Medicare PPO | Admitting: Internal Medicine

## 2022-05-31 ENCOUNTER — Ambulatory Visit
Admission: RE | Admit: 2022-05-31 | Discharge: 2022-05-31 | Disposition: A | Discharge status: Home / Self Care | Payer: Medicare PPO | Source: Ambulatory Visit | Attending: Radiation Oncology | Admitting: Radiation Oncology

## 2022-05-31 ENCOUNTER — Telehealth: Payer: Self-pay | Admitting: *Deleted

## 2022-05-31 ENCOUNTER — Other Ambulatory Visit (HOSPITAL_COMMUNITY): Payer: Self-pay

## 2022-05-31 ENCOUNTER — Inpatient Hospital Stay: Payer: Medicare PPO | Admitting: Internal Medicine

## 2022-05-31 ENCOUNTER — Inpatient Hospital Stay: Payer: Medicare PPO

## 2022-05-31 DIAGNOSIS — E785 Hyperlipidemia, unspecified: Secondary | ICD-10-CM | POA: Diagnosis not present

## 2022-05-31 DIAGNOSIS — I10 Essential (primary) hypertension: Secondary | ICD-10-CM | POA: Diagnosis not present

## 2022-05-31 DIAGNOSIS — C712 Malignant neoplasm of temporal lobe: Secondary | ICD-10-CM | POA: Diagnosis not present

## 2022-05-31 DIAGNOSIS — G4733 Obstructive sleep apnea (adult) (pediatric): Secondary | ICD-10-CM | POA: Diagnosis not present

## 2022-05-31 DIAGNOSIS — Z923 Personal history of irradiation: Secondary | ICD-10-CM | POA: Diagnosis not present

## 2022-05-31 DIAGNOSIS — Z510 Encounter for antineoplastic radiation therapy: Secondary | ICD-10-CM | POA: Diagnosis not present

## 2022-05-31 DIAGNOSIS — I341 Nonrheumatic mitral (valve) prolapse: Secondary | ICD-10-CM | POA: Diagnosis not present

## 2022-05-31 DIAGNOSIS — E039 Hypothyroidism, unspecified: Secondary | ICD-10-CM | POA: Diagnosis not present

## 2022-05-31 DIAGNOSIS — Z9221 Personal history of antineoplastic chemotherapy: Secondary | ICD-10-CM | POA: Diagnosis not present

## 2022-05-31 LAB — RAD ONC ARIA SESSION SUMMARY
Course Elapsed Days: 10
Plan Fractions Treated to Date: 9
Plan Prescribed Dose Per Fraction: 2 Gy
Plan Total Fractions Prescribed: 23
Plan Total Prescribed Dose: 46 Gy
Reference Point Dosage Given to Date: 18 Gy
Reference Point Session Dosage Given: 2 Gy
Session Number: 9

## 2022-05-31 NOTE — Telephone Encounter (Signed)
Patient's daughter [name] called to advise that patient was running a fever yesterday and again today.  She tested patient yesterday and was COVID positive 05/30/2022.   Per Dr Mickeal Skinner hold chemo until next visit which will now be June 12, 2022. She will continue to get radiation at the end of the day for 10 days post positive test.  That has been coordinated.    Daughter is aware.  She will notify [facility] of the change to the medication regimen.

## 2022-06-01 ENCOUNTER — Other Ambulatory Visit: Payer: Self-pay

## 2022-06-01 ENCOUNTER — Ambulatory Visit
Admission: RE | Admit: 2022-06-01 | Discharge: 2022-06-01 | Disposition: A | Discharge status: Home / Self Care | Payer: Medicare PPO | Source: Ambulatory Visit | Attending: Radiation Oncology | Admitting: Radiation Oncology

## 2022-06-01 DIAGNOSIS — E039 Hypothyroidism, unspecified: Secondary | ICD-10-CM | POA: Diagnosis not present

## 2022-06-01 DIAGNOSIS — Z9221 Personal history of antineoplastic chemotherapy: Secondary | ICD-10-CM | POA: Diagnosis not present

## 2022-06-01 DIAGNOSIS — C712 Malignant neoplasm of temporal lobe: Secondary | ICD-10-CM | POA: Diagnosis not present

## 2022-06-01 DIAGNOSIS — I341 Nonrheumatic mitral (valve) prolapse: Secondary | ICD-10-CM | POA: Diagnosis not present

## 2022-06-01 DIAGNOSIS — E785 Hyperlipidemia, unspecified: Secondary | ICD-10-CM | POA: Diagnosis not present

## 2022-06-01 DIAGNOSIS — Z923 Personal history of irradiation: Secondary | ICD-10-CM | POA: Diagnosis not present

## 2022-06-01 DIAGNOSIS — Z510 Encounter for antineoplastic radiation therapy: Secondary | ICD-10-CM | POA: Diagnosis not present

## 2022-06-01 DIAGNOSIS — G4733 Obstructive sleep apnea (adult) (pediatric): Secondary | ICD-10-CM | POA: Diagnosis not present

## 2022-06-01 DIAGNOSIS — I10 Essential (primary) hypertension: Secondary | ICD-10-CM | POA: Diagnosis not present

## 2022-06-01 LAB — RAD ONC ARIA SESSION SUMMARY
Course Elapsed Days: 11
Plan Fractions Treated to Date: 10
Plan Prescribed Dose Per Fraction: 2 Gy
Plan Total Fractions Prescribed: 23
Plan Total Prescribed Dose: 46 Gy
Reference Point Dosage Given to Date: 20 Gy
Reference Point Session Dosage Given: 2 Gy
Session Number: 10

## 2022-06-04 ENCOUNTER — Ambulatory Visit
Admission: RE | Admit: 2022-06-04 | Discharge: 2022-06-04 | Disposition: A | Discharge status: Home / Self Care | Payer: Medicare PPO | Source: Ambulatory Visit | Attending: Radiation Oncology | Admitting: Radiation Oncology

## 2022-06-04 ENCOUNTER — Other Ambulatory Visit: Payer: Self-pay

## 2022-06-04 ENCOUNTER — Other Ambulatory Visit (HOSPITAL_COMMUNITY): Payer: Self-pay

## 2022-06-04 DIAGNOSIS — I341 Nonrheumatic mitral (valve) prolapse: Secondary | ICD-10-CM | POA: Diagnosis not present

## 2022-06-04 DIAGNOSIS — Z923 Personal history of irradiation: Secondary | ICD-10-CM | POA: Diagnosis not present

## 2022-06-04 DIAGNOSIS — I10 Essential (primary) hypertension: Secondary | ICD-10-CM | POA: Diagnosis not present

## 2022-06-04 DIAGNOSIS — G4733 Obstructive sleep apnea (adult) (pediatric): Secondary | ICD-10-CM | POA: Diagnosis not present

## 2022-06-04 DIAGNOSIS — Z9221 Personal history of antineoplastic chemotherapy: Secondary | ICD-10-CM | POA: Diagnosis not present

## 2022-06-04 DIAGNOSIS — E785 Hyperlipidemia, unspecified: Secondary | ICD-10-CM | POA: Diagnosis not present

## 2022-06-04 DIAGNOSIS — C712 Malignant neoplasm of temporal lobe: Secondary | ICD-10-CM | POA: Diagnosis not present

## 2022-06-04 DIAGNOSIS — Z510 Encounter for antineoplastic radiation therapy: Secondary | ICD-10-CM | POA: Diagnosis not present

## 2022-06-04 DIAGNOSIS — E039 Hypothyroidism, unspecified: Secondary | ICD-10-CM | POA: Diagnosis not present

## 2022-06-04 LAB — RAD ONC ARIA SESSION SUMMARY
Course Elapsed Days: 14
Plan Fractions Treated to Date: 11
Plan Prescribed Dose Per Fraction: 2 Gy
Plan Total Fractions Prescribed: 23
Plan Total Prescribed Dose: 46 Gy
Reference Point Dosage Given to Date: 22 Gy
Reference Point Session Dosage Given: 2 Gy
Session Number: 11

## 2022-06-05 ENCOUNTER — Ambulatory Visit
Admission: RE | Admit: 2022-06-05 | Discharge: 2022-06-05 | Disposition: A | Discharge status: Home / Self Care | Payer: Medicare PPO | Source: Ambulatory Visit | Attending: Radiation Oncology | Admitting: Radiation Oncology

## 2022-06-05 ENCOUNTER — Other Ambulatory Visit: Payer: Self-pay

## 2022-06-05 DIAGNOSIS — G4733 Obstructive sleep apnea (adult) (pediatric): Secondary | ICD-10-CM | POA: Diagnosis not present

## 2022-06-05 DIAGNOSIS — C712 Malignant neoplasm of temporal lobe: Secondary | ICD-10-CM | POA: Diagnosis not present

## 2022-06-05 DIAGNOSIS — I10 Essential (primary) hypertension: Secondary | ICD-10-CM | POA: Diagnosis not present

## 2022-06-05 DIAGNOSIS — Z510 Encounter for antineoplastic radiation therapy: Secondary | ICD-10-CM | POA: Diagnosis not present

## 2022-06-05 DIAGNOSIS — Z9221 Personal history of antineoplastic chemotherapy: Secondary | ICD-10-CM | POA: Diagnosis not present

## 2022-06-05 DIAGNOSIS — Z923 Personal history of irradiation: Secondary | ICD-10-CM | POA: Diagnosis not present

## 2022-06-05 DIAGNOSIS — E785 Hyperlipidemia, unspecified: Secondary | ICD-10-CM | POA: Diagnosis not present

## 2022-06-05 DIAGNOSIS — I341 Nonrheumatic mitral (valve) prolapse: Secondary | ICD-10-CM | POA: Diagnosis not present

## 2022-06-05 DIAGNOSIS — E039 Hypothyroidism, unspecified: Secondary | ICD-10-CM | POA: Diagnosis not present

## 2022-06-05 LAB — RAD ONC ARIA SESSION SUMMARY
Course Elapsed Days: 15
Plan Fractions Treated to Date: 12
Plan Prescribed Dose Per Fraction: 2 Gy
Plan Total Fractions Prescribed: 23
Plan Total Prescribed Dose: 46 Gy
Reference Point Dosage Given to Date: 24 Gy
Reference Point Session Dosage Given: 2 Gy
Session Number: 12

## 2022-06-06 ENCOUNTER — Other Ambulatory Visit: Payer: Self-pay

## 2022-06-06 ENCOUNTER — Ambulatory Visit
Admission: RE | Admit: 2022-06-06 | Discharge: 2022-06-06 | Disposition: A | Discharge status: Home / Self Care | Payer: Medicare PPO | Source: Ambulatory Visit | Attending: Radiation Oncology | Admitting: Radiation Oncology

## 2022-06-06 DIAGNOSIS — Z923 Personal history of irradiation: Secondary | ICD-10-CM | POA: Diagnosis not present

## 2022-06-06 DIAGNOSIS — G4733 Obstructive sleep apnea (adult) (pediatric): Secondary | ICD-10-CM | POA: Diagnosis not present

## 2022-06-06 DIAGNOSIS — Z510 Encounter for antineoplastic radiation therapy: Secondary | ICD-10-CM | POA: Diagnosis not present

## 2022-06-06 DIAGNOSIS — I10 Essential (primary) hypertension: Secondary | ICD-10-CM | POA: Diagnosis not present

## 2022-06-06 DIAGNOSIS — I341 Nonrheumatic mitral (valve) prolapse: Secondary | ICD-10-CM | POA: Diagnosis not present

## 2022-06-06 DIAGNOSIS — E785 Hyperlipidemia, unspecified: Secondary | ICD-10-CM | POA: Diagnosis not present

## 2022-06-06 DIAGNOSIS — E039 Hypothyroidism, unspecified: Secondary | ICD-10-CM | POA: Diagnosis not present

## 2022-06-06 DIAGNOSIS — C712 Malignant neoplasm of temporal lobe: Secondary | ICD-10-CM | POA: Diagnosis not present

## 2022-06-06 DIAGNOSIS — Z9221 Personal history of antineoplastic chemotherapy: Secondary | ICD-10-CM | POA: Diagnosis not present

## 2022-06-06 LAB — RAD ONC ARIA SESSION SUMMARY
Course Elapsed Days: 16
Plan Fractions Treated to Date: 13
Plan Prescribed Dose Per Fraction: 2 Gy
Plan Total Fractions Prescribed: 23
Plan Total Prescribed Dose: 46 Gy
Reference Point Dosage Given to Date: 26 Gy
Reference Point Session Dosage Given: 2 Gy
Session Number: 13

## 2022-06-07 ENCOUNTER — Other Ambulatory Visit: Payer: Self-pay

## 2022-06-07 ENCOUNTER — Other Ambulatory Visit: Payer: Self-pay | Admitting: Internal Medicine

## 2022-06-07 ENCOUNTER — Ambulatory Visit
Admission: RE | Admit: 2022-06-07 | Discharge: 2022-06-07 | Disposition: A | Discharge status: Home / Self Care | Payer: Medicare PPO | Source: Ambulatory Visit | Attending: Radiation Oncology | Admitting: Radiation Oncology

## 2022-06-07 ENCOUNTER — Telehealth: Payer: Self-pay | Admitting: *Deleted

## 2022-06-07 DIAGNOSIS — Z9221 Personal history of antineoplastic chemotherapy: Secondary | ICD-10-CM | POA: Diagnosis not present

## 2022-06-07 DIAGNOSIS — I10 Essential (primary) hypertension: Secondary | ICD-10-CM | POA: Diagnosis not present

## 2022-06-07 DIAGNOSIS — G4733 Obstructive sleep apnea (adult) (pediatric): Secondary | ICD-10-CM | POA: Diagnosis not present

## 2022-06-07 DIAGNOSIS — C712 Malignant neoplasm of temporal lobe: Secondary | ICD-10-CM | POA: Diagnosis not present

## 2022-06-07 DIAGNOSIS — E039 Hypothyroidism, unspecified: Secondary | ICD-10-CM | POA: Diagnosis not present

## 2022-06-07 DIAGNOSIS — E785 Hyperlipidemia, unspecified: Secondary | ICD-10-CM | POA: Diagnosis not present

## 2022-06-07 DIAGNOSIS — Z923 Personal history of irradiation: Secondary | ICD-10-CM | POA: Diagnosis not present

## 2022-06-07 DIAGNOSIS — I341 Nonrheumatic mitral (valve) prolapse: Secondary | ICD-10-CM | POA: Diagnosis not present

## 2022-06-07 DIAGNOSIS — Z510 Encounter for antineoplastic radiation therapy: Secondary | ICD-10-CM | POA: Diagnosis not present

## 2022-06-07 LAB — RAD ONC ARIA SESSION SUMMARY
Course Elapsed Days: 17
Plan Fractions Treated to Date: 14
Plan Prescribed Dose Per Fraction: 2 Gy
Plan Total Fractions Prescribed: 23
Plan Total Prescribed Dose: 46 Gy
Reference Point Dosage Given to Date: 28 Gy
Reference Point Session Dosage Given: 2 Gy
Session Number: 14

## 2022-06-07 MED ORDER — LAMOTRIGINE 25 MG PO TABS: ORAL_TABLET | ORAL | 0 refills | Status: DC

## 2022-06-07 NOTE — Telephone Encounter (Signed)
Likely multifactorial, Keppra is part of the problem though.  We discussed Lamictal at last visit... Lamictal '50mg'$  daily x7 days, then '100mg'$  daily x7 days, then '100mg'$  BID thereafter  Keppra can be discontinued once at '100mg'$  BID dose level, but I should be seeing her before then  I sent Lamictal in to [facility].  Obviously it will take some time for this to have an effect.  I am seeing them next Tuesday   Communicated new medication and potential side effects to daughter.  She expressed understanding and denies having any further questions at this time.

## 2022-06-07 NOTE — Telephone Encounter (Signed)
Patients daughter called to report that patient is on day 9 post Covid.  She no longer has a fever or cough but reports severe fatigue and emotionally unstable.  She reports that she is crying a lot.  She is isolating and depressed.  No outbursts of anger.    Daughter is seeking advice on if there is anything she can take to help give her a physical boost and wasn't sure if it was a side effect of her Keppra.  She is aware that she could be fatigued from Covid and also from radiation.  Reports this has intensified over the past 3 days.  Routed to Dr Mickeal Skinner

## 2022-06-08 ENCOUNTER — Ambulatory Visit
Admission: RE | Admit: 2022-06-08 | Discharge: 2022-06-08 | Disposition: A | Discharge status: Home / Self Care | Payer: Medicare PPO | Source: Ambulatory Visit | Attending: Radiation Oncology | Admitting: Radiation Oncology

## 2022-06-08 ENCOUNTER — Other Ambulatory Visit: Payer: Self-pay

## 2022-06-08 DIAGNOSIS — I10 Essential (primary) hypertension: Secondary | ICD-10-CM | POA: Diagnosis not present

## 2022-06-08 DIAGNOSIS — Z510 Encounter for antineoplastic radiation therapy: Secondary | ICD-10-CM | POA: Diagnosis not present

## 2022-06-08 DIAGNOSIS — C712 Malignant neoplasm of temporal lobe: Secondary | ICD-10-CM | POA: Diagnosis not present

## 2022-06-08 DIAGNOSIS — Z9221 Personal history of antineoplastic chemotherapy: Secondary | ICD-10-CM | POA: Diagnosis not present

## 2022-06-08 DIAGNOSIS — I341 Nonrheumatic mitral (valve) prolapse: Secondary | ICD-10-CM | POA: Diagnosis not present

## 2022-06-08 DIAGNOSIS — G4733 Obstructive sleep apnea (adult) (pediatric): Secondary | ICD-10-CM | POA: Diagnosis not present

## 2022-06-08 DIAGNOSIS — E785 Hyperlipidemia, unspecified: Secondary | ICD-10-CM | POA: Diagnosis not present

## 2022-06-08 DIAGNOSIS — E039 Hypothyroidism, unspecified: Secondary | ICD-10-CM | POA: Diagnosis not present

## 2022-06-08 DIAGNOSIS — Z923 Personal history of irradiation: Secondary | ICD-10-CM | POA: Diagnosis not present

## 2022-06-08 LAB — RAD ONC ARIA SESSION SUMMARY
Course Elapsed Days: 18
Plan Fractions Treated to Date: 15
Plan Prescribed Dose Per Fraction: 2 Gy
Plan Total Fractions Prescribed: 23
Plan Total Prescribed Dose: 46 Gy
Reference Point Dosage Given to Date: 30 Gy
Reference Point Session Dosage Given: 2 Gy
Session Number: 15

## 2022-06-11 ENCOUNTER — Other Ambulatory Visit (HOSPITAL_COMMUNITY): Payer: Self-pay

## 2022-06-11 ENCOUNTER — Other Ambulatory Visit: Payer: Self-pay

## 2022-06-11 ENCOUNTER — Ambulatory Visit
Admission: RE | Admit: 2022-06-11 | Discharge: 2022-06-11 | Disposition: A | Discharge status: Home / Self Care | Payer: Medicare PPO | Source: Ambulatory Visit | Attending: Radiation Oncology | Admitting: Radiation Oncology

## 2022-06-11 DIAGNOSIS — I10 Essential (primary) hypertension: Secondary | ICD-10-CM | POA: Diagnosis not present

## 2022-06-11 DIAGNOSIS — E039 Hypothyroidism, unspecified: Secondary | ICD-10-CM | POA: Diagnosis not present

## 2022-06-11 DIAGNOSIS — Z510 Encounter for antineoplastic radiation therapy: Secondary | ICD-10-CM | POA: Diagnosis not present

## 2022-06-11 DIAGNOSIS — I341 Nonrheumatic mitral (valve) prolapse: Secondary | ICD-10-CM | POA: Diagnosis not present

## 2022-06-11 DIAGNOSIS — C712 Malignant neoplasm of temporal lobe: Secondary | ICD-10-CM | POA: Diagnosis not present

## 2022-06-11 DIAGNOSIS — G4733 Obstructive sleep apnea (adult) (pediatric): Secondary | ICD-10-CM | POA: Diagnosis not present

## 2022-06-11 DIAGNOSIS — Z923 Personal history of irradiation: Secondary | ICD-10-CM | POA: Diagnosis not present

## 2022-06-11 DIAGNOSIS — Z9221 Personal history of antineoplastic chemotherapy: Secondary | ICD-10-CM | POA: Diagnosis not present

## 2022-06-11 DIAGNOSIS — E785 Hyperlipidemia, unspecified: Secondary | ICD-10-CM | POA: Diagnosis not present

## 2022-06-11 LAB — RAD ONC ARIA SESSION SUMMARY
Course Elapsed Days: 21
Plan Fractions Treated to Date: 16
Plan Prescribed Dose Per Fraction: 2 Gy
Plan Total Fractions Prescribed: 23
Plan Total Prescribed Dose: 46 Gy
Reference Point Dosage Given to Date: 32 Gy
Reference Point Session Dosage Given: 2 Gy
Session Number: 16

## 2022-06-12 ENCOUNTER — Inpatient Hospital Stay: Payer: Medicare PPO

## 2022-06-12 ENCOUNTER — Ambulatory Visit
Admission: RE | Admit: 2022-06-12 | Discharge: 2022-06-12 | Disposition: A | Discharge status: Home / Self Care | Payer: Medicare PPO | Source: Ambulatory Visit | Attending: Radiation Oncology | Admitting: Radiation Oncology

## 2022-06-12 ENCOUNTER — Inpatient Hospital Stay: Payer: Medicare PPO | Admitting: Internal Medicine

## 2022-06-12 ENCOUNTER — Other Ambulatory Visit: Payer: Self-pay

## 2022-06-12 VITALS — BP 175/77 | HR 95 | Temp 98.4°F | Resp 16 | Wt 148.9 lb

## 2022-06-12 DIAGNOSIS — C719 Malignant neoplasm of brain, unspecified: Secondary | ICD-10-CM

## 2022-06-12 DIAGNOSIS — E785 Hyperlipidemia, unspecified: Secondary | ICD-10-CM | POA: Diagnosis not present

## 2022-06-12 DIAGNOSIS — I10 Essential (primary) hypertension: Secondary | ICD-10-CM | POA: Diagnosis not present

## 2022-06-12 DIAGNOSIS — Z9221 Personal history of antineoplastic chemotherapy: Secondary | ICD-10-CM | POA: Diagnosis not present

## 2022-06-12 DIAGNOSIS — E039 Hypothyroidism, unspecified: Secondary | ICD-10-CM | POA: Diagnosis not present

## 2022-06-12 DIAGNOSIS — I341 Nonrheumatic mitral (valve) prolapse: Secondary | ICD-10-CM | POA: Diagnosis not present

## 2022-06-12 DIAGNOSIS — G4733 Obstructive sleep apnea (adult) (pediatric): Secondary | ICD-10-CM | POA: Diagnosis not present

## 2022-06-12 DIAGNOSIS — Z923 Personal history of irradiation: Secondary | ICD-10-CM | POA: Diagnosis not present

## 2022-06-12 DIAGNOSIS — Z510 Encounter for antineoplastic radiation therapy: Secondary | ICD-10-CM | POA: Diagnosis not present

## 2022-06-12 DIAGNOSIS — C712 Malignant neoplasm of temporal lobe: Secondary | ICD-10-CM | POA: Diagnosis not present

## 2022-06-12 LAB — CBC WITH DIFFERENTIAL (CANCER CENTER ONLY)
Abs Immature Granulocytes: 0.02 10*3/uL (ref 0.00–0.07)
Basophils Absolute: 0.1 10*3/uL (ref 0.0–0.1)
Basophils Relative: 1 %
Eosinophils Absolute: 0.3 10*3/uL (ref 0.0–0.5)
Eosinophils Relative: 4 %
HCT: 36.8 % (ref 36.0–46.0)
Hemoglobin: 12.6 g/dL (ref 12.0–15.0)
Immature Granulocytes: 0 %
Lymphocytes Relative: 8 %
Lymphs Abs: 0.8 10*3/uL (ref 0.7–4.0)
MCH: 27.3 pg (ref 26.0–34.0)
MCHC: 34.2 g/dL (ref 30.0–36.0)
MCV: 79.7 fL — ABNORMAL LOW (ref 80.0–100.0)
Monocytes Absolute: 0.7 10*3/uL (ref 0.1–1.0)
Monocytes Relative: 7 %
Neutro Abs: 7.5 10*3/uL (ref 1.7–7.7)
Neutrophils Relative %: 80 %
Platelet Count: 311 10*3/uL (ref 150–400)
RBC: 4.62 MIL/uL (ref 3.87–5.11)
RDW: 13.4 % (ref 11.5–15.5)
WBC Count: 9.4 10*3/uL (ref 4.0–10.5)
nRBC: 0 % (ref 0.0–0.2)

## 2022-06-12 LAB — CMP (CANCER CENTER ONLY)
ALT: 18 U/L (ref 0–44)
AST: 12 U/L — ABNORMAL LOW (ref 15–41)
Albumin: 4.1 g/dL (ref 3.5–5.0)
Alkaline Phosphatase: 48 U/L (ref 38–126)
Anion gap: 7 (ref 5–15)
BUN: 17 mg/dL (ref 8–23)
CO2: 26 mmol/L (ref 22–32)
Calcium: 9.3 mg/dL (ref 8.9–10.3)
Chloride: 102 mmol/L (ref 98–111)
Creatinine: 0.62 mg/dL (ref 0.44–1.00)
GFR, Estimated: >60 mL/min (ref >60)
Glucose, Bld: 250 mg/dL — ABNORMAL HIGH (ref 70–99)
Potassium: 3.8 mmol/L (ref 3.5–5.1)
Sodium: 135 mmol/L (ref 135–145)
Total Bilirubin: 0.4 mg/dL (ref 0.3–1.2)
Total Protein: 6.7 g/dL (ref 6.5–8.1)

## 2022-06-12 LAB — RAD ONC ARIA SESSION SUMMARY
Course Elapsed Days: 22
Plan Fractions Treated to Date: 17
Plan Prescribed Dose Per Fraction: 2 Gy
Plan Total Fractions Prescribed: 23
Plan Total Prescribed Dose: 46 Gy
Reference Point Dosage Given to Date: 34 Gy
Reference Point Session Dosage Given: 2 Gy
Session Number: 17

## 2022-06-12 IMAGING — MR MR ABDOMEN WO/W CM MRCP
18 of 20 series · 43 of 48 positions shown · IV contrast (multihance)
Comparison: MRI abdomen dated 10/26/2019 and 09/19/2017. CT abdomen
dated 09/25/2016.

CLINICAL DATA: Follow-up pancreatic cyst/pseudocyst

EXAM:
MRI ABDOMEN WITHOUT AND WITH CONTRAST (INCLUDING MRCP)
TECHNIQUE: Multiplanar multisequence MR imaging of the abdomen was performed
both before and after the administration of intravenous contrast.
Heavily T2-weighted images of the biliary and pancreatic ducts were
obtained, and three-dimensional MRCP images were rendered by post
processing.
CONTRAST:  14mL MULTIHANCE GADOBENATE DIMEGLUMINE 529 MG/ML IV SOLN

[Series 5: T2 · coronal · 5.0mm · 1.56mm/px · 1 of 30 slices shown (1 of 4)]
[im 1/30]
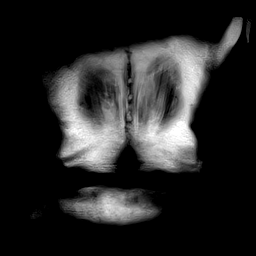

[Series 6: T1 · axial · 3.0mm · 1.19mm/px · z∈[+10,+222]mm · 4 of 144 slices shown]
[im 1/144]
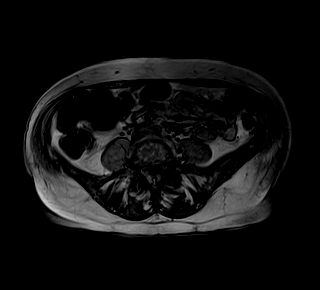
[im 48/144]
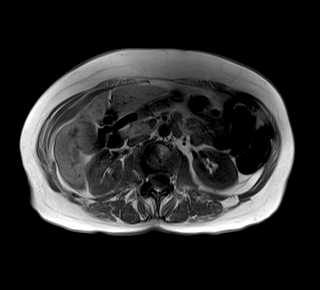
[im 96/144]
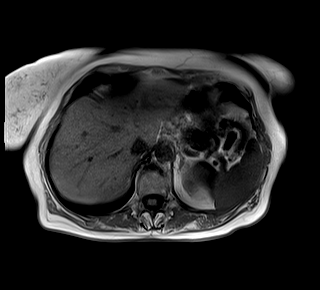
[im 144/144]
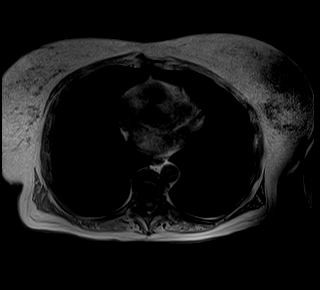

[Series 9: MRCP · coronal · 1.0mm · 0.49mm/px · 2 of 80 slices shown]
[im 1/80]
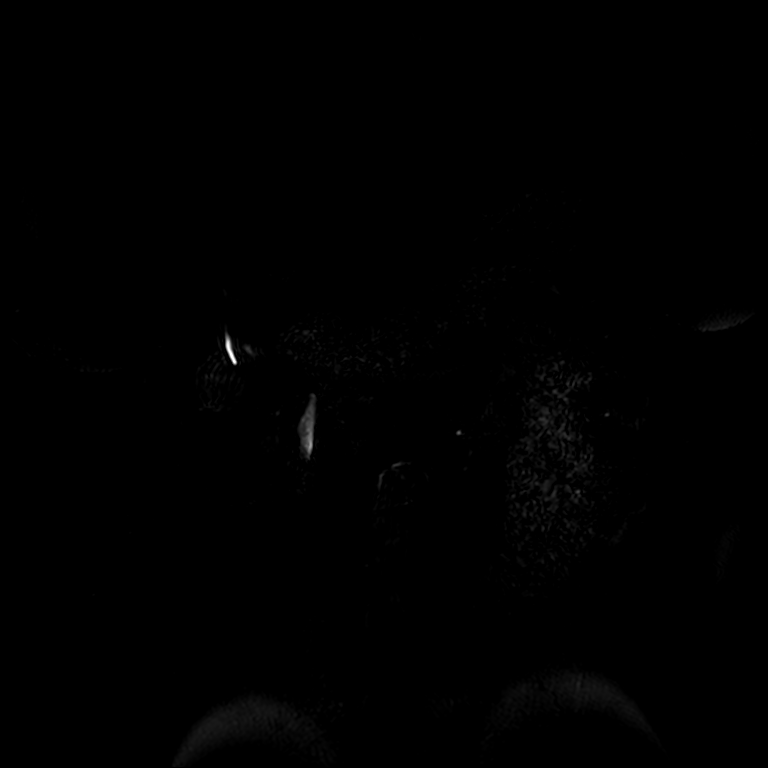
[im 80/80]
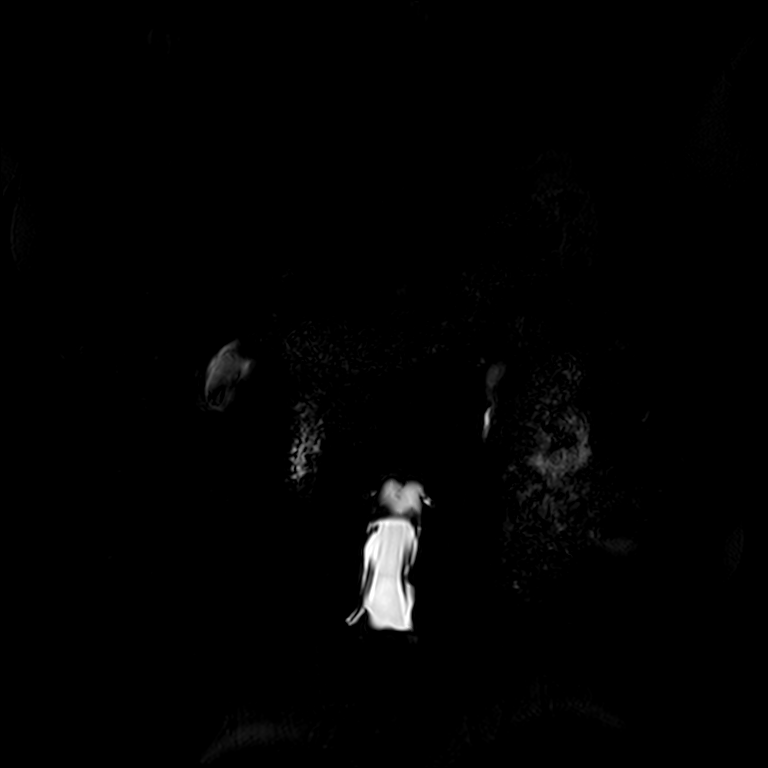

[Series 12: T2 · axial · 5.0mm · 1.48mm/px · 1 of 43 slices shown (2 of 4)]
[im 1/43]
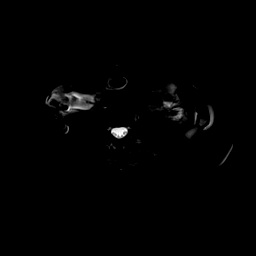

[Series 13: DWI · axial · 5.0mm · 1.42mm/px · z∈[+27,+272]mm · 5 of 126 slices shown (1 of 2)]
[im 1/126]
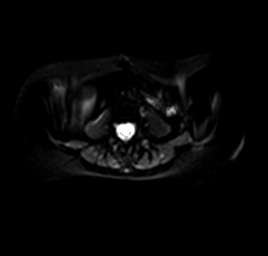
[im 32/126]
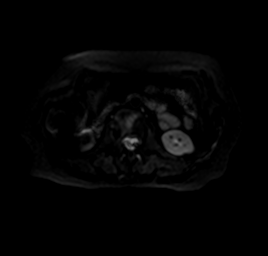
[im 63/126]
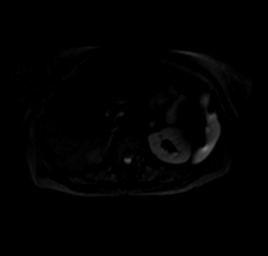
[im 94/126]
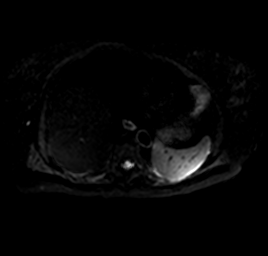
[im 126/126]
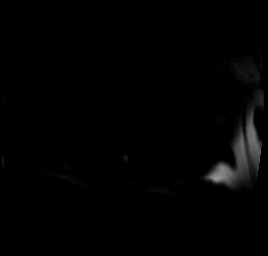

[Series 14: DWI · axial · 5.0mm · 1.42mm/px · z∈[+27,+272]mm · 2 of 42 slices shown (2 of 2)]
[im 1/42]
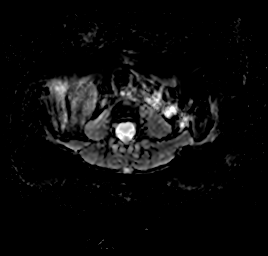
[im 42/42]
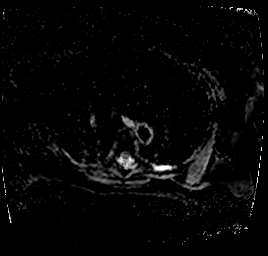

[Series 15: T2 · axial · 6.0mm · 1.22mm/px · 1 of 30 slices shown (3 of 4)]
[im 1/30]
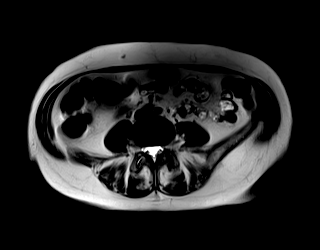

[Series 16: bSSFP · axial · 5.0mm · 1.25mm/px · 1 of 38 slices shown]
[im 1/38]
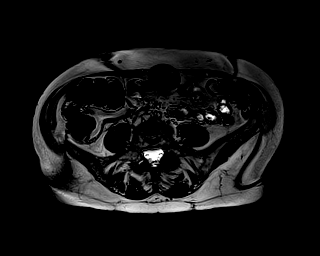

[Series 17: T2 · coronal · 3.0mm · 1.19mm/px · 1 of 26 slices shown (4 of 4)]
[im 1/26]
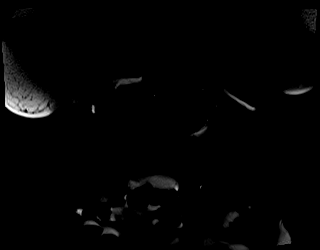

[Series 18: T1 dynamic · axial · non-contrast · 3.0mm · 1.25mm/px · z∈[+11,+223]mm · 3 of 72 slices shown]
[im 1/72]
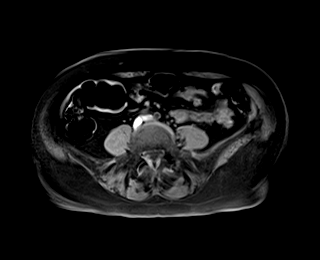
[im 36/72]
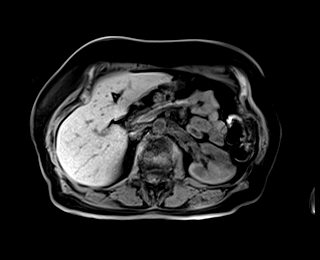
[im 72/72]
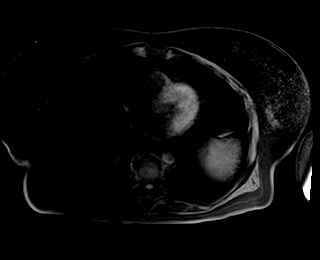

[Series 19: T1 dynamic post-contrast · axial · 3.0mm · 1.25mm/px · z∈[+11,+223]mm · 3 of 72 slices shown (1 of 8)]
[im 1/72]
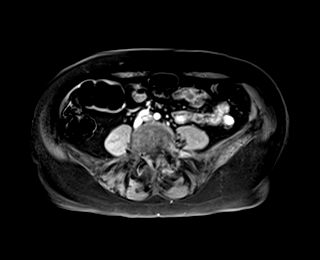
[im 36/72]
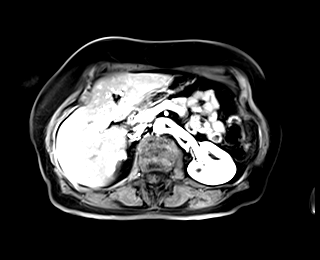
[im 72/72]
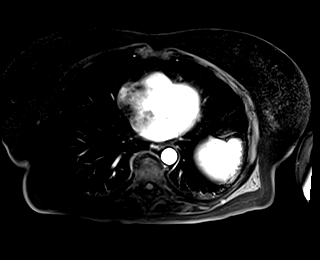

[Series 20: T1 dynamic post-contrast · axial · 3.0mm · 1.25mm/px · z∈[+11,+223]mm · 3 of 71 slices shown (2 of 8)]
[im 1/71]
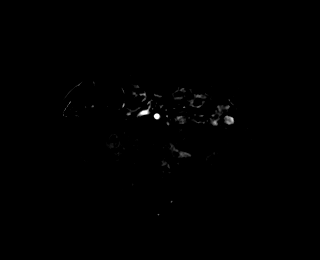
[im 36/71]
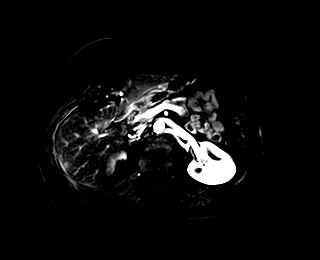
[im 71/71]
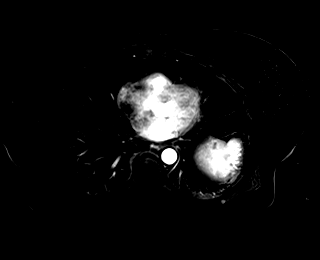

[Series 21: T1 dynamic post-contrast · axial · 3.0mm · 1.25mm/px · z∈[+11,+223]mm · 3 of 72 slices shown (3 of 8)]
[im 1/72]
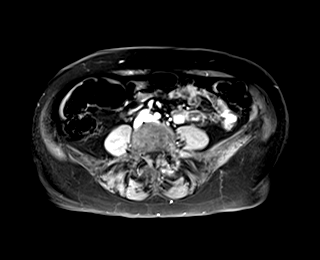
[im 36/72]
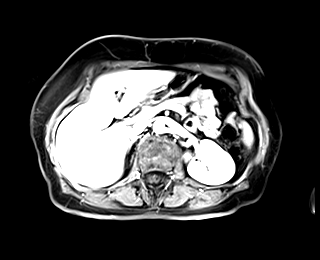
[im 72/72]
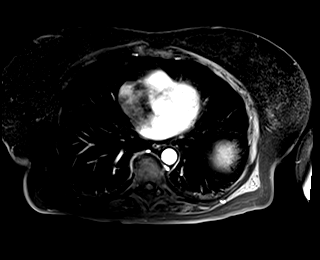

[Series 22: T1 dynamic post-contrast · axial · 3.0mm · 1.25mm/px · z∈[+11,+223]mm · 3 of 72 slices shown (4 of 8)]
[im 1/72]
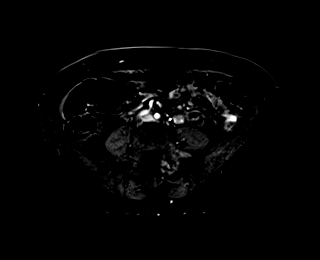
[im 36/72]
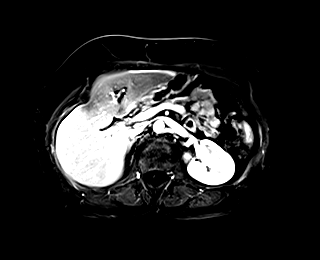
[im 72/72]
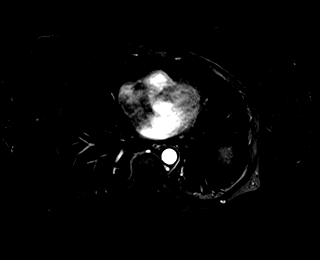

[Series 23: T1 dynamic post-contrast · axial · 3.0mm · 1.25mm/px · z∈[+11,+223]mm · 3 of 72 slices shown (5 of 8)]
[im 1/72]
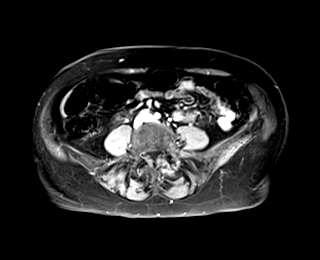
[im 36/72]
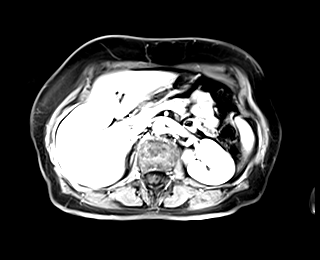
[im 72/72]
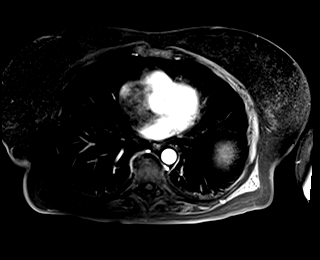

[Series 24: T1 dynamic post-contrast · axial · 3.0mm · 1.25mm/px · z∈[+11,+223]mm · 3 of 72 slices shown (6 of 8)]
[im 1/72]
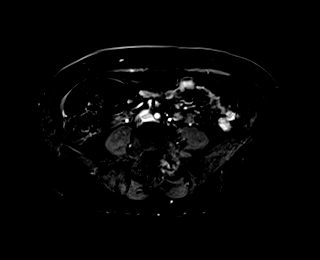
[im 36/72]
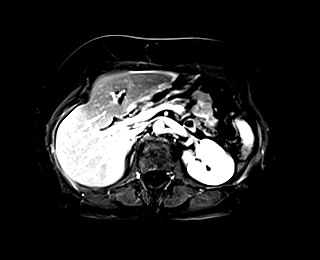
[im 72/72]
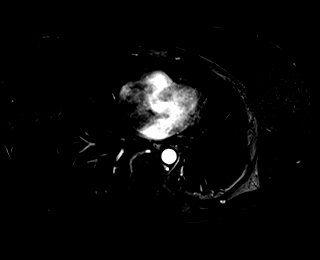

[Series 25: T1 dynamic post-contrast · coronal · 3.0mm · 1.25mm/px · 2 of 64 slices shown (7 of 8)]
[im 1/64]
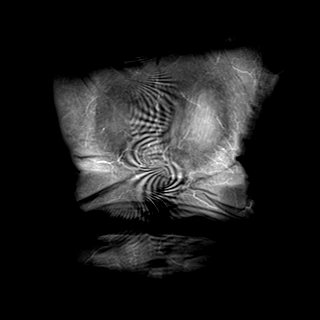
[im 64/64]
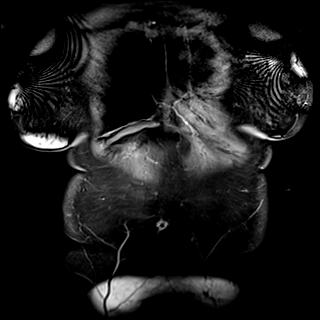

[Series 26: T1 dynamic post-contrast · axial · 3.0mm · 1.25mm/px · z∈[+11,+115]mm · 2 of 72 slices shown (8 of 8)]
[im 1/72]
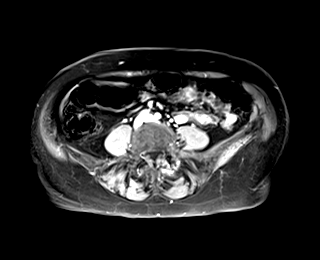
[im 36/72]
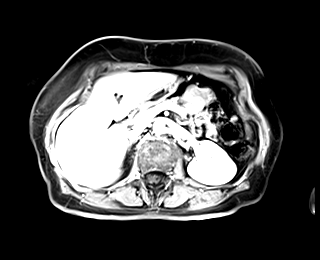

[43 of 48 positions shown; findings below may reference images not displayed]

FINDINGS: Lower chest: Lung bases are clear

Hepatobiliary: Liver is within normal limits. No
suspicious/enhancing hepatic lesions. No hepatic steatosis.

Gallbladder is unremarkable. No intrahepatic or extrahepatic ductal
dilatation.

Pancreas: Scattered dominant unilocular cystic lesion the pancreatic
tail measuring 7 mm (series 12/image 20), favoring a small
pseudocyst versus IPMN, grossly unchanged from 7699. Additional tiny
pancreatic cystic lesions likely reflect side branch ectasia versus
side branch IPMNs (for example, series 12/images 22, 24, 27, 30, 32,
and 33), measuring 2-5 mm. No main pancreatic ductal dilatation. No
enhancing pancreatic mass or atrophy.

Spleen:  Within normal limits.

Adrenals/Urinary Tract:  Adrenal glands are within normal limits.

Right renal cortical scarring. Multiple right renal cysts, measuring
up to 15 mm in the medial right upper kidney (series 15/image 17),
benign (Bosniak I). Left kidney is within normal limits. No
hydronephrosis.

Stomach/Bowel: Stomach is notable for a tiny hiatal hernia.
Visualized bowel is unremarkable.

Vascular/Lymphatic:  No evidence of abdominal aortic aneurysm.

No suspicious abdominal lymphadenopathy.

Other:  No abdominal ascites.

Musculoskeletal: Mild degenerative changes of the upper lumbar
spine.
IMPRESSION: Scattered tiny pancreatic cysts measuring up to 7 mm, benign.
Dominant lesion is grossly unchanged dating back to 7699. Given the
small size, it is unclear that additional follow-up is required.
However, if desired, follow-up MR abdomen with/without contrast
could be considered in 2 years.

## 2022-06-12 NOTE — Progress Notes (Signed)
[facility] [place] [facility] [place], [place] [address] [phone]   Interval Evaluation  Date of Service: 06/12/22 Patient Name: Yvonne [name] Patient MRN: [id number] Patient DOB: [birth year] Provider: Ventura Sellers, MD  Identifying Statement:  Yvonne [name] is a 76 y.o. female with left temporal glioblastoma    Oncologic History: Oncology History  Glioblastoma, IDH-wildtype ([place])  04/18/2022 Surgery   Left temporal craniotomy, resection with Dr. Tommi Rumps at [facility].  Path is glioblastoma, IDHwt   05/16/2022 -  Chemotherapy   [name] : BRAIN GLIOBLASTOMA Radiation Therapy With Concurrent Temozolomide 75 mg/m2 Daily Followed By Sequential Maintenance Temozolomide x 6-12 cycles       Biomarkers:  MGMT Unknown.  IDH 1/2 Wild type.  EGFR Unknown  TERT Unknown   Interval History: Yvonne [name] presents today for follow up, now in week 4 of radiation and Temodar.  She and her daughter describe no significant changes with regards to energy, cognition, speech.  There are some concerns about short term memory, irritable behavior.  No further seizures, no headaches.    H+P (05/03/22) Patient presented to medical attention in August 2023 with episode of sudden onset speech impairment.  Initially treated as stroke with TPA, CNS imaging later demonstrated enhancing mass in the left temporal lobe, c/w primary brain tumor.  She underwent resection at [facility] with Dr. Tommi Rumps after second opinion there.  Following surgery she experienced some speech impairment; this has improved modestly, but she still experiences difficulty with finding correct words.  Also struggling with short term memory.  She is currently living with her daughter given cognitive limitations, need for some supervision.  No further seizures with the Keppra 522m twice per day.  Currently on decadron 238mdaily per discharge taper from [facility]    Medications: Current Outpatient Medications on File Prior to Visit  Medication Sig Dispense Refill   acetaminophen (TYLENOL) 500 MG tablet Take 1,000 mg by mouth every 8 (eight) hours as needed for moderate pain. (Patient not taking: Reported on 05/03/2022)     Cholecalciferol (VITAMIN D) 50 MCG (2000 UT) tablet Take 2,000 Units by mouth daily.      fluticasone (FLONASE) 50 MCG/ACT nasal spray Place 2 sprays into both nostrils at bedtime. (Patient not taking: Reported on 05/03/2022)     furosemide (LASIX) 20 MG tablet Take 10 mg by mouth daily. (Patient not taking: Reported on 05/03/2022)     gabapentin (NEURONTIN) 300 MG capsule Take 300 mg by mouth at bedtime.      lamoTRIgine (LAMICTAL) 25 MG tablet Take 2 tablets (50 mg total) by mouth daily for 7 days, THEN 4 tablets (100 mg total) daily for 7 days. 42 tablet 0   levETIRAcetam (KEPPRA) 500 MG tablet Take 1 tablet (500 mg total) by mouth 2 (two) times daily. 60 tablet 2   levothyroxine (SYNTHROID, LEVOTHROID) 100 MCG tablet Take 100 mcg by mouth daily before breakfast.     metoprolol succinate (TOPROL-XL) 25 MG 24 hr tablet TAKE 1/2 TABLET BY MOUTH DAILY. (Patient not taking: Reported on 05/03/2022) 45 tablet 3   metroNIDAZOLE (METROCREAM) 0.75 % cream Apply 1 application topically daily.  (Patient not taking: Reported on 05/03/2022)     ondansetron (ZOFRAN) 8 MG tablet Take 1 tablet (8 mg total) by mouth every 8 (eight) hours as needed for nausea or vomiting. May take 30-60 minutes prior to Temodar administration if nausea/vomiting occurs as needed. 30 tablet  1   OZEMPIC, 0.25 OR 0.5 MG/DOSE, 2 MG/3ML SOPN Inject 0.25 mg into the skin once a week.     potassium chloride SA (K-DUR,KLOR-CON) 20 MEQ tablet TAKE 1/2 TABLET BY MOUTH DAILY. (Patient not taking: Reported on 05/03/2022) 45 tablet 3   Prenatal Vit-Fe Fumarate-FA (PRENATAL MULTIVITAMIN) TABS tablet Take 1 tablet by mouth daily. (Patient not taking: Reported on 05/03/2022)     rosuvastatin  (CRESTOR) 5 MG tablet Take 1 tablet (5 mg total) by mouth daily. 90 tablet 3   temozolomide (TEMODAR) 100 MG capsule Take 1 capsule (100 mg total) by mouth daily. May take on an empty stomach to decrease nausea & vomiting. 42 capsule 0   temozolomide (TEMODAR) 20 MG capsule Take 1 capsule (20 mg total) by mouth daily. May take on an empty stomach to decrease nausea & vomiting. 42 capsule 0   temozolomide (TEMODAR) 5 MG capsule Take 2 capsules (10 mg total) by mouth daily. May take on an empty stomach to decrease nausea & vomiting. 84 capsule 0   Current Facility-Administered Medications on File Prior to Visit  Medication Dose Route Frequency Provider Last Rate Last Admin   influenza vaccine adjuvanted (FLUAD) injection 0.5 mL  0.5 mL Intramuscular Once Willman Cuny, Acey Lav, MD        Allergies:  Allergies  Allergen Reactions   Tobramycin Hives   Sulfa Antibiotics     Stomach upset   Erythromycin Nausea Only   Past Medical History:  Past Medical History:  Diagnosis Date   Anxiety    Chest pressure    Degenerative arthritis    Deviated septum    Diabetes mellitus without complication (HCC)    diet controlled   Diabetic neuropathy ([place]) 12/03/2017   DJD (degenerative joint disease) of knee    Family history of bladder cancer    Family history of brain cancer    Family history of breast cancer    Family history of ovarian cancer    Family history of prostate cancer    Family history of stomach cancer    Family history of throat cancer    Family history of uterine cancer    Graves disease    RADIOACTIVE IODINE 1983   H/O hiatal hernia    Heart palpitations    History of diastolic dysfunction    per echo in 2011   Hyperlipidemia    Hypertension    Hypothyroidism    LVH (left ventricular hypertrophy)    per echo in 2011   Mitral valve prolapse    Neuropathy, peripheral    DR. Jannifer Franklin 12/15   Normal nuclear stress test 2011   OSA (obstructive sleep apnea)    Polyneuropathy in  other diseases classified elsewhere ([place]) 05/21/2013   Rosacea 2019   MELANOMA CHEST REMOVED IN SITU   Sleep difficulties    Tinnitus    Past Surgical History:  Past Surgical History:  Procedure Laterality Date   ABDOMINAL HYSTERECTOMY  2000   TOTAL   APPENDECTOMY  1989   CARDIOVASCULAR STRESS TEST  11/09/2009   EF 75%   CARPAL TUNNEL RELEASE  2002   bilateral   COLONOSCOPY WITH PROPOFOL N/A 10/23/2018   Procedure: COLONOSCOPY WITH PROPOFOL;  Surgeon: [name], MD;  Location: [facility];  Service: Endoscopy;  Laterality: N/A;   ESOPHAGOGASTRODUODENOSCOPY N/A 11/16/2021   Procedure: ESOPHAGOGASTRODUODENOSCOPY (EGD);  Surgeon: [name], MD;  Location: [facility];  Service: Endoscopy;  Laterality: N/A;   ESOPHAGOGASTRODUODENOSCOPY (EGD) WITH PROPOFOL  N/A 10/23/2018   Procedure: ESOPHAGOGASTRODUODENOSCOPY (EGD) WITH PROPOFOL;  Surgeon: [name], MD;  Location: [facility];  Service: Endoscopy;  Laterality: N/A;   EUS N/A 10/23/2018   Procedure: UPPER ENDOSCOPIC ULTRASOUND (EUS) RADIAL;  Surgeon: [name], MD;  Location: [facility];  Service: Endoscopy;  Laterality: N/A;   EUS N/A 11/16/2021   Procedure: UPPER ENDOSCOPIC ULTRASOUND (EUS) RADIAL;  Surgeon: [name], MD;  Location: [facility];  Service: Endoscopy;  Laterality: N/A;   MASS EXCISION Left 06/02/2019   Procedure: EXCISION OF SUBCUTANEOUS ABDOMINAL WALL MASSES LEFT LOWER QUADRANT;  Surgeon: [name], MD;  Location: [facility];  Service: General;  Laterality: Left;   [place] ECHOCARDIOGRAM  11/01/2009   NORMAL LV FILLING AND DIASTOLIC DYSFUNCTION AND MILD AORTIC SCLEROSIS AND TRACE MITRAL REGURGITATION   Social History:  Social History   Socioeconomic History   Marital status: Married    Spouse name: Edd Fabian   Number of children: 1   Years of education: COLLEGE   Highest education level: Not on file  Occupational History   Occupation: Retired  Tobacco Use    Smoking status: Former    Packs/day: 0.50    Years: 10.00    Total pack years: 5.00    Types: Cigarettes    Quit date: 08/14/1975    Years since quitting: 46.8   Smokeless tobacco: Never  Vaping Use   Vaping Use: Never used  Substance and Sexual Activity   Alcohol use: Yes    Comment: one glass of wine a week   Drug use: No   Sexual activity: Not on file  Other Topics Concern   Not on file  Social History Narrative   Lives at home w/ her husband, Edd Fabian.   Right-handed.   Drinks no caffeine but does eat chocolate occasionally.   epworth sleepiness scale = 3 (01/10/16)   Social Determinants of Health   Financial Resource Strain: Not on file  Food Insecurity: Not on file  Transportation Needs: Not on file  Physical Activity: Not on file  Stress: Not on file  Social Connections: Not on file  Intimate Partner Violence: Not on file   Family History:  Family History  Problem Relation Age of Onset   Hypertension Mother    Heart disease Mother    Dementia Mother    Heart disease Father    Arthritis Father    Diabetes Father    Hypertension Father    Pancreatic cancer Brother 41       pancreatic   Other Brother        automobile accident   Throat cancer Maternal Uncle    Brain cancer Paternal Aunt 65       malignant brain cancer   Stroke Maternal Grandmother    Heart disease Maternal Grandfather    Pancreatic cancer Cousin 44       pancreatic (maternal first cousin)   Pancreatic cancer Cousin 7       pancreatic, smoker (maternal first cousin)   Breast cancer Cousin 67       breast (maternal first cousin)   Ovarian cancer Cousin    Uterine cancer Cousin 65       uterine (paternal first cousin)   Cancer Cousin 49       unknown primary (paternal first cousin)   Bladder Cancer Cousin 76       (paternal first cousin)   Lung cancer Cousin    Throat cancer Cousin  Prostate cancer Cousin        (maternal first cousin)   Stomach cancer Cousin 59       (maternal  first cousin)   Cancer Other 62       pancreatic neuroendocrine carcinoma   Colon cancer Neg Hx    Neuropathy Neg Hx     Review of Systems: Constitutional: Doesn't report fevers, chills or abnormal weight loss Eyes: Doesn't report blurriness of vision Ears, nose, mouth, throat, and face: Doesn't report sore throat Respiratory: Doesn't report cough, dyspnea or wheezes Cardiovascular: Doesn't report palpitation, chest discomfort  Gastrointestinal:  Doesn't report nausea, constipation, diarrhea GU: Doesn't report incontinence Skin: Doesn't report skin rashes Neurological: Per HPI Musculoskeletal: Doesn't report joint pain Behavioral/Psych: Doesn't report anxiety  Physical Exam: Vitals:   06/12/22 0848  BP: (!) 175/77  Pulse: 95  Resp: 16  Temp: 98.4 F (36.9 C)  SpO2: 98%    KPS: 70. General: Alert, cooperative, pleasant, in no acute distress Head: Normal EENT: No conjunctival injection or scleral icterus.  Lungs: Resp effort normal Cardiac: Regular rate Abdomen: Non-distended abdomen Skin: No rashes cyanosis or petechiae. Extremities: No clubbing or edema  Neurologic Exam: Mental Status: Awake, alert, attentive to examiner. Oriented to self and environment. Language is fluent but comprehension is markedly impaired.  Anterograde amnesia is noted, mild neglect, elements of agnosia. Cranial Nerves: Visual acuity is grossly normal. Visual fields are full. Extra-ocular movements intact. No ptosis. Face is symmetric Motor: Tone and bulk are normal. Power is full in both arms and legs. Reflexes are symmetric, no pathologic reflexes present.  Sensory: Intact to light touch Gait: Normal.   Labs: I have reviewed the data as listed    Component Value Date/Time   NA 137 04/06/2022 0145   K 4.1 04/06/2022 0145   CL 106 04/06/2022 0145   CO2 23 04/06/2022 0145   GLUCOSE 114 (H) 04/06/2022 0145   BUN 7 (L) 04/06/2022 0145   CREATININE 0.66 04/06/2022 0145   CREATININE 0.74  11/15/2020 1210   CREATININE 0.71 01/04/2016 0739   CALCIUM 9.0 04/06/2022 0145   PROT 5.3 (L) 04/04/2022 1714   PROT 6.7 10/03/2021 0918   ALBUMIN 3.4 (L) 04/04/2022 1714   AST 22 04/04/2022 1714   AST 15 11/15/2020 1210   ALT 19 04/04/2022 1714   ALT 21 11/15/2020 1210   ALKPHOS 43 04/04/2022 1714   BILITOT 0.5 04/04/2022 1714   BILITOT 0.4 11/15/2020 1210   GFRNONAA >60 04/06/2022 0145   GFRNONAA >60 11/15/2020 1210   GFRAA >60 10/23/2019 1004   Lab Results  Component Value Date   WBC 6.7 04/06/2022   NEUTROABS 4.0 04/04/2022   HGB 12.5 04/06/2022   HCT 36.3 04/06/2022   MCV 81.6 04/06/2022   PLT 158 04/06/2022    Assessment/Plan Glioblastoma, IDH-wildtype ([place])  Yvonne [name] is clinically stable today.  She continues to demonstrate limited insight into disease process due to anterograde amnesia and dysphasia.  Labs are within normal limits today.  We ultimately recommended continuing with course of intensity modulated radiation therapy and concurrent daily Temozolomide.  Radiation will be administered Mon-Fri over 6 weeks, Temodar will be dosed at 20m/m2 to be given daily over 42 days.  We reviewed side effects of temodar, including fatigue, nausea/vomiting, constipation, and cytopenias.  Chemotherapy should be held for the following:  ANC less than 1,000  Platelets less than 100,000  LFT or creatinine greater than 2x ULN  If clinical concerns/contraindications develop  Every  2 weeks during radiation, labs will be checked accompanied by a clinical evaluation in the brain tumor clinic.  Keppra will con't 556m BID for now.    She should resume her CPAP, due for titration which they will schedule.  All questions were answered. The patient knows to call the clinic with any problems, questions or concerns. No barriers to learning were detected.  The total time spent in the encounter was 30 minutes and more than 50% was on counseling and review of test  results   ZVentura Sellers MD Medical Director of Neuro-Oncology [facility] [place]10/31/23 8:57 AM

## 2022-06-13 ENCOUNTER — Other Ambulatory Visit (HOSPITAL_COMMUNITY): Payer: Self-pay

## 2022-06-13 ENCOUNTER — Telehealth: Payer: Self-pay

## 2022-06-13 ENCOUNTER — Ambulatory Visit
Admission: RE | Admit: 2022-06-13 | Discharge: 2022-06-13 | Disposition: A | Discharge status: Home / Self Care | Payer: Medicare PPO | Source: Ambulatory Visit | Attending: Radiation Oncology | Admitting: Radiation Oncology

## 2022-06-13 ENCOUNTER — Other Ambulatory Visit: Payer: Self-pay

## 2022-06-13 DIAGNOSIS — Z87891 Personal history of nicotine dependence: Secondary | ICD-10-CM | POA: Diagnosis not present

## 2022-06-13 DIAGNOSIS — Z79630 Long term (current) use of alkylating agent: Secondary | ICD-10-CM | POA: Diagnosis not present

## 2022-06-13 DIAGNOSIS — Z9221 Personal history of antineoplastic chemotherapy: Secondary | ICD-10-CM | POA: Diagnosis not present

## 2022-06-13 DIAGNOSIS — G4733 Obstructive sleep apnea (adult) (pediatric): Secondary | ICD-10-CM | POA: Insufficient documentation

## 2022-06-13 DIAGNOSIS — Z85820 Personal history of malignant melanoma of skin: Secondary | ICD-10-CM | POA: Diagnosis not present

## 2022-06-13 DIAGNOSIS — I10 Essential (primary) hypertension: Secondary | ICD-10-CM | POA: Insufficient documentation

## 2022-06-13 DIAGNOSIS — I341 Nonrheumatic mitral (valve) prolapse: Secondary | ICD-10-CM | POA: Insufficient documentation

## 2022-06-13 DIAGNOSIS — Z8041 Family history of malignant neoplasm of ovary: Secondary | ICD-10-CM | POA: Insufficient documentation

## 2022-06-13 DIAGNOSIS — Z8042 Family history of malignant neoplasm of prostate: Secondary | ICD-10-CM | POA: Diagnosis not present

## 2022-06-13 DIAGNOSIS — Z801 Family history of malignant neoplasm of trachea, bronchus and lung: Secondary | ICD-10-CM | POA: Diagnosis not present

## 2022-06-13 DIAGNOSIS — Z79899 Other long term (current) drug therapy: Secondary | ICD-10-CM | POA: Diagnosis not present

## 2022-06-13 DIAGNOSIS — Z510 Encounter for antineoplastic radiation therapy: Secondary | ICD-10-CM | POA: Insufficient documentation

## 2022-06-13 DIAGNOSIS — Z8052 Family history of malignant neoplasm of bladder: Secondary | ICD-10-CM | POA: Insufficient documentation

## 2022-06-13 DIAGNOSIS — C712 Malignant neoplasm of temporal lobe: Secondary | ICD-10-CM | POA: Insufficient documentation

## 2022-06-13 DIAGNOSIS — Z923 Personal history of irradiation: Secondary | ICD-10-CM | POA: Insufficient documentation

## 2022-06-13 DIAGNOSIS — E039 Hypothyroidism, unspecified: Secondary | ICD-10-CM | POA: Diagnosis not present

## 2022-06-13 DIAGNOSIS — Z79890 Hormone replacement therapy (postmenopausal): Secondary | ICD-10-CM | POA: Insufficient documentation

## 2022-06-13 DIAGNOSIS — E785 Hyperlipidemia, unspecified: Secondary | ICD-10-CM | POA: Diagnosis not present

## 2022-06-13 DIAGNOSIS — Z803 Family history of malignant neoplasm of breast: Secondary | ICD-10-CM | POA: Diagnosis not present

## 2022-06-13 LAB — RAD ONC ARIA SESSION SUMMARY
Course Elapsed Days: 23
Plan Fractions Treated to Date: 18
Plan Prescribed Dose Per Fraction: 2 Gy
Plan Total Fractions Prescribed: 23
Plan Total Prescribed Dose: 46 Gy
Reference Point Dosage Given to Date: 36 Gy
Reference Point Session Dosage Given: 2 Gy
Session Number: 18

## 2022-06-13 NOTE — Telephone Encounter (Signed)
CSW attempted to contact patient's daughter, [name], per the request of Dr. Mickeal Skinner, to assess psychosocial needs.  Left vm.

## 2022-06-14 ENCOUNTER — Ambulatory Visit
Admission: RE | Admit: 2022-06-14 | Discharge: 2022-06-14 | Disposition: A | Discharge status: Home / Self Care | Payer: Medicare PPO | Source: Ambulatory Visit | Attending: Radiation Oncology | Admitting: Radiation Oncology

## 2022-06-14 ENCOUNTER — Other Ambulatory Visit: Payer: Self-pay

## 2022-06-14 ENCOUNTER — Inpatient Hospital Stay: Payer: Medicare PPO | Admitting: Internal Medicine

## 2022-06-14 ENCOUNTER — Inpatient Hospital Stay: Payer: Medicare PPO

## 2022-06-14 DIAGNOSIS — E785 Hyperlipidemia, unspecified: Secondary | ICD-10-CM | POA: Diagnosis not present

## 2022-06-14 DIAGNOSIS — Z510 Encounter for antineoplastic radiation therapy: Secondary | ICD-10-CM | POA: Diagnosis not present

## 2022-06-14 DIAGNOSIS — Z9221 Personal history of antineoplastic chemotherapy: Secondary | ICD-10-CM | POA: Diagnosis not present

## 2022-06-14 DIAGNOSIS — Z923 Personal history of irradiation: Secondary | ICD-10-CM | POA: Diagnosis not present

## 2022-06-14 DIAGNOSIS — C712 Malignant neoplasm of temporal lobe: Secondary | ICD-10-CM | POA: Diagnosis not present

## 2022-06-14 DIAGNOSIS — I10 Essential (primary) hypertension: Secondary | ICD-10-CM | POA: Diagnosis not present

## 2022-06-14 DIAGNOSIS — E039 Hypothyroidism, unspecified: Secondary | ICD-10-CM | POA: Diagnosis not present

## 2022-06-14 DIAGNOSIS — G4733 Obstructive sleep apnea (adult) (pediatric): Secondary | ICD-10-CM | POA: Diagnosis not present

## 2022-06-14 DIAGNOSIS — I341 Nonrheumatic mitral (valve) prolapse: Secondary | ICD-10-CM | POA: Diagnosis not present

## 2022-06-14 LAB — RAD ONC ARIA SESSION SUMMARY
Course Elapsed Days: 24
Plan Fractions Treated to Date: 19
Plan Prescribed Dose Per Fraction: 2 Gy
Plan Total Fractions Prescribed: 23
Plan Total Prescribed Dose: 46 Gy
Reference Point Dosage Given to Date: 38 Gy
Reference Point Session Dosage Given: 2 Gy
Session Number: 19

## 2022-06-15 ENCOUNTER — Inpatient Hospital Stay: Payer: Medicare PPO

## 2022-06-15 ENCOUNTER — Ambulatory Visit
Admission: RE | Admit: 2022-06-15 | Discharge: 2022-06-15 | Disposition: A | Discharge status: Home / Self Care | Payer: Medicare PPO | Source: Ambulatory Visit | Attending: Radiation Oncology | Admitting: Radiation Oncology

## 2022-06-15 ENCOUNTER — Other Ambulatory Visit: Payer: Self-pay

## 2022-06-15 ENCOUNTER — Other Ambulatory Visit (HOSPITAL_COMMUNITY): Payer: Self-pay

## 2022-06-15 DIAGNOSIS — E039 Hypothyroidism, unspecified: Secondary | ICD-10-CM | POA: Insufficient documentation

## 2022-06-15 DIAGNOSIS — Z79899 Other long term (current) drug therapy: Secondary | ICD-10-CM | POA: Insufficient documentation

## 2022-06-15 DIAGNOSIS — C712 Malignant neoplasm of temporal lobe: Secondary | ICD-10-CM | POA: Insufficient documentation

## 2022-06-15 DIAGNOSIS — Z79630 Long term (current) use of alkylating agent: Secondary | ICD-10-CM | POA: Insufficient documentation

## 2022-06-15 DIAGNOSIS — Z79890 Hormone replacement therapy (postmenopausal): Secondary | ICD-10-CM | POA: Insufficient documentation

## 2022-06-15 DIAGNOSIS — E785 Hyperlipidemia, unspecified: Secondary | ICD-10-CM | POA: Insufficient documentation

## 2022-06-15 DIAGNOSIS — Z800 Family history of malignant neoplasm of digestive organs: Secondary | ICD-10-CM | POA: Insufficient documentation

## 2022-06-15 DIAGNOSIS — E119 Type 2 diabetes mellitus without complications: Secondary | ICD-10-CM | POA: Insufficient documentation

## 2022-06-15 DIAGNOSIS — I10 Essential (primary) hypertension: Secondary | ICD-10-CM | POA: Diagnosis not present

## 2022-06-15 DIAGNOSIS — Z803 Family history of malignant neoplasm of breast: Secondary | ICD-10-CM | POA: Insufficient documentation

## 2022-06-15 DIAGNOSIS — I341 Nonrheumatic mitral (valve) prolapse: Secondary | ICD-10-CM | POA: Diagnosis not present

## 2022-06-15 DIAGNOSIS — Z9221 Personal history of antineoplastic chemotherapy: Secondary | ICD-10-CM | POA: Diagnosis not present

## 2022-06-15 DIAGNOSIS — G629 Polyneuropathy, unspecified: Secondary | ICD-10-CM | POA: Insufficient documentation

## 2022-06-15 DIAGNOSIS — Z8052 Family history of malignant neoplasm of bladder: Secondary | ICD-10-CM | POA: Insufficient documentation

## 2022-06-15 DIAGNOSIS — G4733 Obstructive sleep apnea (adult) (pediatric): Secondary | ICD-10-CM | POA: Insufficient documentation

## 2022-06-15 DIAGNOSIS — Z510 Encounter for antineoplastic radiation therapy: Secondary | ICD-10-CM | POA: Diagnosis not present

## 2022-06-15 DIAGNOSIS — Z85820 Personal history of malignant melanoma of skin: Secondary | ICD-10-CM | POA: Insufficient documentation

## 2022-06-15 DIAGNOSIS — Z923 Personal history of irradiation: Secondary | ICD-10-CM | POA: Diagnosis not present

## 2022-06-15 LAB — RAD ONC ARIA SESSION SUMMARY
Course Elapsed Days: 25
Plan Fractions Treated to Date: 20
Plan Prescribed Dose Per Fraction: 2 Gy
Plan Total Fractions Prescribed: 23
Plan Total Prescribed Dose: 46 Gy
Reference Point Dosage Given to Date: 40 Gy
Reference Point Session Dosage Given: 2 Gy
Session Number: 20

## 2022-06-15 NOTE — Progress Notes (Signed)
[place] CSW Progress Note  Clinical Education officer, museum contacted caregiver by phone to assess psychosocial needs.  Patient is currently living with [name], her daughter.   Patient's husband is living alone in their home.  He is 74 y/o and very independent.  Patient has sitters, who are friends of patient.  They remain with her while [name] is at work for safety concerns.  Discussed patient's future needs.  [name] has been very pro-active in obtaining information and exploring services that are available.  She reported that patient's chemo medication is very expensive.  Per her request, CSW emailed her the contact pharmacist for possible discounts.  Also made a referral to the financial counselor for the [employer].  CSW provided active listening and supportive counseling.  [name] expressed no other needs at this time.     Rodman Pickle Bradford Cazier, LCSW

## 2022-06-18 ENCOUNTER — Encounter: Payer: Self-pay | Admitting: Radiation Oncology

## 2022-06-18 ENCOUNTER — Ambulatory Visit
Admission: RE | Admit: 2022-06-18 | Discharge: 2022-06-18 | Disposition: A | Discharge status: Home / Self Care | Payer: Medicare PPO | Source: Ambulatory Visit | Attending: Radiation Oncology | Admitting: Radiation Oncology

## 2022-06-18 ENCOUNTER — Other Ambulatory Visit: Payer: Self-pay

## 2022-06-18 ENCOUNTER — Other Ambulatory Visit (HOSPITAL_COMMUNITY): Payer: Self-pay

## 2022-06-18 ENCOUNTER — Other Ambulatory Visit: Payer: Self-pay | Admitting: Radiation Oncology

## 2022-06-18 DIAGNOSIS — Z923 Personal history of irradiation: Secondary | ICD-10-CM | POA: Diagnosis not present

## 2022-06-18 DIAGNOSIS — E039 Hypothyroidism, unspecified: Secondary | ICD-10-CM | POA: Diagnosis not present

## 2022-06-18 DIAGNOSIS — C712 Malignant neoplasm of temporal lobe: Secondary | ICD-10-CM | POA: Diagnosis not present

## 2022-06-18 DIAGNOSIS — Z9221 Personal history of antineoplastic chemotherapy: Secondary | ICD-10-CM | POA: Diagnosis not present

## 2022-06-18 DIAGNOSIS — R600 Localized edema: Secondary | ICD-10-CM

## 2022-06-18 DIAGNOSIS — I341 Nonrheumatic mitral (valve) prolapse: Secondary | ICD-10-CM | POA: Diagnosis not present

## 2022-06-18 DIAGNOSIS — E785 Hyperlipidemia, unspecified: Secondary | ICD-10-CM | POA: Diagnosis not present

## 2022-06-18 DIAGNOSIS — G4733 Obstructive sleep apnea (adult) (pediatric): Secondary | ICD-10-CM | POA: Diagnosis not present

## 2022-06-18 DIAGNOSIS — I10 Essential (primary) hypertension: Secondary | ICD-10-CM | POA: Diagnosis not present

## 2022-06-18 DIAGNOSIS — Z510 Encounter for antineoplastic radiation therapy: Secondary | ICD-10-CM | POA: Diagnosis not present

## 2022-06-18 LAB — RAD ONC ARIA SESSION SUMMARY
Course Elapsed Days: 28
Plan Fractions Treated to Date: 21
Plan Prescribed Dose Per Fraction: 2 Gy
Plan Total Fractions Prescribed: 23
Plan Total Prescribed Dose: 46 Gy
Reference Point Dosage Given to Date: 42 Gy
Reference Point Session Dosage Given: 2 Gy
Session Number: 21

## 2022-06-18 NOTE — Progress Notes (Signed)
I saw the patient and her daughter back at the treatment area she is currently undergoing chemoradiation for glioma of the temporal lobe.  She has received 21 treatments to date.  She has been having muffled hearing in her left ear, as well as feeling fullness in the left ear, and episodes of waxing and waning facial edema on the left side.  Of note she recently had COVID and her presurgical and pretreatment imaging including her postop films from [facility] do show a left mastoid effusion and left craniotomy changes including subcutaneous edema and gas.  No infections have been recently identified in her white blood cell count was normal range about a week ago.  She has not had any known fevers.  Her facial edema came on after her COVID diagnosis. On exam she has asymmetry in her left eyelids more noticeable in the upper eyelid on the left, a well-healed temporal craniotomy incision site, and the skin is hyperemic and tight over this area with palpable bone but no palpable crepitus.  No evidence of cellulitic appearing change or separation of her incision appreciated.  She does have asymmetry and mild edema as well on the upper left cheek extending to the tragus of the ear.  There is asymmetry as well of the left pinna but no palpable fluctuance.  An otoscope for the right external auditory canal has mild dry cerumen and a normal-appearing tympanic membrane.  Evaluation of the left ear with otoscope showed mild cerumen without impaction and what appears to be a tympanic effusion no cloudiness or evidence of injection of the tympanic membrane was appreciated.    We discussed these examination findings.  While it is not likely that these changes have anything to do with progressive disease from her cancer, she may have some lymphatic congestion as a result of her recent infections, complicating the fact that she is already had baseline effusion and axillary sinus thickening based on prior imaging.  We discussed  over-the-counter Sudafed twice daily for 14 days to try and alleviate some of the congestion behind the tympanic membrane which may also give mild improvement to her mastoid effusion.  Ideally steroids would be better for her mastoid effusion but at this point she would like to hold off on this until she has had a chance to try the Sudafed.  We discussed referral to physical therapy as well to see if they feel like she needs any lymphatic assessment/massage or compressive techniques to try and ameliorate the symptoms.  She will also be following up at [facility] July 28, 2022 for follow-up and MRI imaging at that point.  Otherwise she will continue radiotherapy as previously outlined.

## 2022-06-19 ENCOUNTER — Other Ambulatory Visit: Payer: Self-pay

## 2022-06-19 ENCOUNTER — Ambulatory Visit
Admission: RE | Admit: 2022-06-19 | Discharge: 2022-06-19 | Disposition: A | Discharge status: Home / Self Care | Payer: Medicare PPO | Source: Ambulatory Visit | Attending: Radiation Oncology | Admitting: Radiation Oncology

## 2022-06-19 DIAGNOSIS — I341 Nonrheumatic mitral (valve) prolapse: Secondary | ICD-10-CM | POA: Diagnosis not present

## 2022-06-19 DIAGNOSIS — E039 Hypothyroidism, unspecified: Secondary | ICD-10-CM | POA: Diagnosis not present

## 2022-06-19 DIAGNOSIS — Z923 Personal history of irradiation: Secondary | ICD-10-CM | POA: Diagnosis not present

## 2022-06-19 DIAGNOSIS — I10 Essential (primary) hypertension: Secondary | ICD-10-CM | POA: Diagnosis not present

## 2022-06-19 DIAGNOSIS — C712 Malignant neoplasm of temporal lobe: Secondary | ICD-10-CM | POA: Diagnosis not present

## 2022-06-19 DIAGNOSIS — E785 Hyperlipidemia, unspecified: Secondary | ICD-10-CM | POA: Diagnosis not present

## 2022-06-19 DIAGNOSIS — Z9221 Personal history of antineoplastic chemotherapy: Secondary | ICD-10-CM | POA: Diagnosis not present

## 2022-06-19 DIAGNOSIS — Z510 Encounter for antineoplastic radiation therapy: Secondary | ICD-10-CM | POA: Diagnosis not present

## 2022-06-19 DIAGNOSIS — G4733 Obstructive sleep apnea (adult) (pediatric): Secondary | ICD-10-CM | POA: Diagnosis not present

## 2022-06-19 LAB — RAD ONC ARIA SESSION SUMMARY
Course Elapsed Days: 29
Plan Fractions Treated to Date: 22
Plan Prescribed Dose Per Fraction: 2 Gy
Plan Total Fractions Prescribed: 23
Plan Total Prescribed Dose: 46 Gy
Reference Point Dosage Given to Date: 44 Gy
Reference Point Session Dosage Given: 2 Gy
Session Number: 22

## 2022-06-20 ENCOUNTER — Other Ambulatory Visit: Payer: Self-pay

## 2022-06-20 ENCOUNTER — Ambulatory Visit
Admission: RE | Admit: 2022-06-20 | Discharge: 2022-06-20 | Disposition: A | Discharge status: Home / Self Care | Payer: Medicare PPO | Source: Ambulatory Visit | Attending: Radiation Oncology | Admitting: Radiation Oncology

## 2022-06-20 ENCOUNTER — Ambulatory Visit: Payer: Medicare PPO | Attending: Radiation Oncology | Admitting: Physical Therapy

## 2022-06-20 DIAGNOSIS — C712 Malignant neoplasm of temporal lobe: Secondary | ICD-10-CM | POA: Diagnosis not present

## 2022-06-20 DIAGNOSIS — Z8616 Personal history of COVID-19: Secondary | ICD-10-CM | POA: Insufficient documentation

## 2022-06-20 DIAGNOSIS — I890 Lymphedema, not elsewhere classified: Secondary | ICD-10-CM | POA: Diagnosis not present

## 2022-06-20 DIAGNOSIS — R600 Localized edema: Secondary | ICD-10-CM | POA: Insufficient documentation

## 2022-06-20 DIAGNOSIS — Z9221 Personal history of antineoplastic chemotherapy: Secondary | ICD-10-CM | POA: Diagnosis not present

## 2022-06-20 DIAGNOSIS — I10 Essential (primary) hypertension: Secondary | ICD-10-CM | POA: Diagnosis not present

## 2022-06-20 DIAGNOSIS — G4733 Obstructive sleep apnea (adult) (pediatric): Secondary | ICD-10-CM | POA: Diagnosis not present

## 2022-06-20 DIAGNOSIS — I341 Nonrheumatic mitral (valve) prolapse: Secondary | ICD-10-CM | POA: Diagnosis not present

## 2022-06-20 DIAGNOSIS — Z923 Personal history of irradiation: Secondary | ICD-10-CM | POA: Diagnosis not present

## 2022-06-20 DIAGNOSIS — E039 Hypothyroidism, unspecified: Secondary | ICD-10-CM | POA: Diagnosis not present

## 2022-06-20 DIAGNOSIS — Z510 Encounter for antineoplastic radiation therapy: Secondary | ICD-10-CM | POA: Diagnosis not present

## 2022-06-20 DIAGNOSIS — E785 Hyperlipidemia, unspecified: Secondary | ICD-10-CM | POA: Diagnosis not present

## 2022-06-20 LAB — RAD ONC ARIA SESSION SUMMARY
Course Elapsed Days: 30
Plan Fractions Treated to Date: 23
Plan Prescribed Dose Per Fraction: 2 Gy
Plan Total Fractions Prescribed: 23
Plan Total Prescribed Dose: 46 Gy
Reference Point Dosage Given to Date: 46 Gy
Reference Point Session Dosage Given: 2 Gy
Session Number: 23

## 2022-06-20 NOTE — Therapy (Signed)
OUTPATIENT PHYSICAL THERAPY ONCOLOGY EVALUATION  Patient Name: Yvonne [name] MRN: [id number] DOB:1945-11-04, 76 y.o., female Today's Date: 06/20/2022   PT End of Session - 06/20/22 1701     Visit Number 1    Number of Visits 9    Date for PT Re-Evaluation 07/18/22    PT Start Time 1606    PT Stop Time 1027    PT Time Calculation (min) 52 min    Activity Tolerance Patient tolerated treatment well    Behavior During Therapy [facility] for tasks assessed/performed             Past Medical History:  Diagnosis Date   Anxiety    Chest pressure    Degenerative arthritis    Deviated septum    Diabetes mellitus without complication (Yvonne [name])    diet controlled   Diabetic neuropathy ([name]) 12/03/2017   DJD (degenerative joint disease) of knee    Family history of bladder cancer    Family history of brain cancer    Family history of breast cancer    Family history of ovarian cancer    Family history of prostate cancer    Family history of stomach cancer    Family history of throat cancer    Family history of uterine cancer    Graves disease    RADIOACTIVE IODINE 1983   H/O hiatal hernia    Heart palpitations    History of diastolic dysfunction    per echo in 2011   Hyperlipidemia    Hypertension    Hypothyroidism    LVH (left ventricular hypertrophy)    per echo in 2011   Mitral valve prolapse    Neuropathy, peripheral    DR. Jannifer Franklin 12/15   Normal nuclear stress test 2011   OSA (obstructive sleep apnea)    Polyneuropathy in other diseases classified elsewhere (Yvonne [name]) 05/21/2013   Rosacea 2019   MELANOMA CHEST REMOVED IN SITU   Sleep difficulties    Tinnitus    Past Surgical History:  Procedure Laterality Date   ABDOMINAL HYSTERECTOMY  2000   TOTAL   APPENDECTOMY  1989   CARDIOVASCULAR STRESS TEST  11/09/2009   EF 75%   CARPAL TUNNEL RELEASE  2002   bilateral   COLONOSCOPY WITH PROPOFOL N/A 10/23/2018   Procedure: COLONOSCOPY WITH PROPOFOL;  Surgeon: [name], MD;   Location: [facility];  Service: Endoscopy;  Laterality: N/A;   ESOPHAGOGASTRODUODENOSCOPY N/A 11/16/2021   Procedure: ESOPHAGOGASTRODUODENOSCOPY (EGD);  Surgeon: [name], MD;  Location: [facility];  Service: Endoscopy;  Laterality: N/A;   ESOPHAGOGASTRODUODENOSCOPY (EGD) WITH PROPOFOL N/A 10/23/2018   Procedure: ESOPHAGOGASTRODUODENOSCOPY (EGD) WITH PROPOFOL;  Surgeon: [name], MD;  Location: [facility];  Service: Endoscopy;  Laterality: N/A;   EUS N/A 10/23/2018   Procedure: UPPER ENDOSCOPIC ULTRASOUND (EUS) RADIAL;  Surgeon: [name], MD;  Location: [facility];  Service: Endoscopy;  Laterality: N/A;   EUS N/A 11/16/2021   Procedure: UPPER ENDOSCOPIC ULTRASOUND (EUS) RADIAL;  Surgeon: [name], MD;  Location: [facility];  Service: Endoscopy;  Laterality: N/A;   MASS EXCISION Left 06/02/2019   Procedure: EXCISION OF SUBCUTANEOUS ABDOMINAL WALL MASSES LEFT LOWER QUADRANT;  Surgeon: [name], MD;  Location: [facility];  Service: General;  Laterality: Left;   TONSILLECTOMY  1958   TRANSTHORACIC ECHOCARDIOGRAM  11/01/2009   NORMAL LV FILLING AND DIASTOLIC DYSFUNCTION AND MILD AORTIC SCLEROSIS AND TRACE MITRAL REGURGITATION   Patient Active Problem List   Diagnosis Date Noted  Glioblastoma, IDH-wildtype ([name]) 05/03/2022   Acute ischemic stroke ([place]) 04/04/2022   Family history of brain cancer    Family history of throat cancer    Family history of breast cancer    Family history of ovarian cancer    Family history of prostate cancer    Family history of stomach cancer    Family history of bladder cancer    Family history of uterine cancer    Anxiety 10/27/2020   Daytime somnolence 10/27/2020   Diabetic peripheral neuropathy associated with type 2 diabetes mellitus (Yvonne [name]) 10/27/2020   Excessive sleepiness 10/27/2020   Hypertension 10/27/2020   Obstructive sleep apnea syndrome 10/27/2020   Peripheral neuropathy 10/27/2020   Personal history of in-situ  neoplasm of other site 10/27/2020   Rosacea 10/27/2020   Vitamin D deficiency 10/27/2020   Deviated septum 05/09/2020   Trochanteric bursitis of left hip 12/30/2019   Special screening for malignant neoplasms, colon    Diverticulosis of colon without hemorrhage    Family history of pancreatic cancer    Pancreatic cyst    Osteoarthritis of knee 12/04/2017   Diabetic neuropathy (Yvonne [name]) 12/03/2017   Newly diagnosed diabetes ([name]) 06/20/2017   Bilateral hearing loss 05/28/2017   Bilateral impacted cerumen 05/28/2017   Seasonal allergic rhinitis 05/28/2017   Tinnitus, bilateral 05/28/2017   Genetic testing 10/15/2016   DM2 (diabetes mellitus, type 2) ([place]) 01/10/2016   Polyneuropathy in other diseases classified elsewhere (Yvonne [name]) 05/21/2013   History of hyperglycemia 04/01/2012   Atypical chest pain 12/28/2011   Benign hypertensive heart disease without heart failure 02/05/2011   Hyperlipidemia 02/05/2011   Hypothyroidism 02/05/2011   Palpitations 02/05/2011    PCP: Kathyrn Lass, MD  REFERRING PROVIDER: Hayden Pedro, PA-C   REFERRING DIAG: C71.2 (ICD-10-CM) - Glioblastoma of temporal lobe (Yvonne [name]) R60.0 (ICD-10-CM) - Facial edema    THERAPY DIAG:  Lymphedema, not elsewhere classified - Plan: PT plan of care cert/re-cert  Primary glioblastoma multiforme of temporal lobe (Yvonne [name]) - Plan: PT plan of care cert/re-cert  ONSET DATE: 09/8001  Rationale for Evaluation and Treatment: Rehabilitation  SUBJECTIVE:                                                                                                                                                                                           SUBJECTIVE STATEMENT: Pt's daughter reports the pt began swelling in her face and around tympanic membrane following Covid.  PERTINENT HISTORY:   L temporal glioblastoma -Yvonne [name] presented to medical attention in August 2023 with episode of sudden onset speech impairment.  Initially  treated as stroke with TPA, CNS imaging later demonstrated  enhancing mass in the left temporal lobe, c/w primary brain tumor.  She underwent resection at [facility] with Dr. Tommi Rumps after second opinion there.  Following surgery she experienced some speech impairment; this has improved modestly, but she still experiences difficulty with finding correct words.  Also struggling with short term memory.  She is currently living with her daughter given cognitive limitations, need for some supervision.  Currently undergoing chemoradiation and presents with some facial swelling.  currently undergoing chemoradiation for glioma of the temporal lobe.  She has received 21 treatments to date.  She has been having muffled hearing in her left ear, as well as feeling fullness in the left ear, and episodes of waxing and waning facial edema on the left side.  Of note she recently had COVID and her presurgical and pretreatment imaging including her postop films from [facility] do show a left mastoid effusion and left craniotomy changes including subcutaneous edema and gas.  No infections have been recently identified in her white blood cell count was normal range about a week ago.  She has not had any known fevers.  Her facial edema came on after her COVID diagnosis. On exam she has asymmetry in her left eyelids more noticeable in the upper eyelid on the left, a well-healed temporal craniotomy incision site, and the skin is hyperemic and tight over this area with palpable bone but no palpable crepitus.  No evidence of cellulitic appearing change or separation of her incision appreciated.  She does have asymmetry and mild edema as well on the upper left cheek extending to the tragus of the ear.  There is asymmetry as well of the left pinna but no palpable fluctuance.  An otoscope for the right external auditory canal has mild dry cerumen and a normal-appearing tympanic membrane.  Evaluation of the left ear with otoscope showed mild cerumen  without impaction and what appears to be a tympanic effusion no cloudiness or evidence of injection of the tympanic membrane was appreciated. She may have some lymphatic congestion as a result of her recent infections, complicating the fact that she is already had baseline effusion and axillary sinus thickening based on prior imaging. PAIN:  Are you having pain? No pt reports no pain but feeling of thickness in her ear  PRECAUTIONS: None undergoing chemo and radiation for glioblastoma  WEIGHT BEARING RESTRICTIONS: No  [name]:  Has patient fallen in last 6 months? No  LIVING ENVIRONMENT: Lives with: lives with their daughter Lives in: House/apartment Has following equipment at home: None  OCCUPATION: retired  LEISURE: pt walks daily several times a day  HAND DOMINANCE: right   PRIOR LEVEL OF FUNCTION: Independent  PATIENT GOALS: to get the swelling down and assist with the drainage of fluid on the left side   OBJECTIVE:  COGNITION: Overall cognitive status: Impaired   PALPATION: Decreased scar mobility just anterior to L ear, increased thickness from swelling in L side of face  OBSERVATIONS / OTHER ASSESSMENTS: increased puffiness in L eyelid, at left temple, L cheek and L ear  POSTURE: forward head and rounded shoulders  LYMPHEDEMA ASSESSMENT:    Circumference in cm  4 cm superior to sternal notch around neck   6 cm superior to sternal notch around neck   8 cm superior to sternal notch around neck   R lateral nostril from base of nose to medial tragus 12.5  L lateral nostril from base of nose to medial tragus 12.9  R corner of mouth to where ear  lobe meets face 10  L corner of mouth to where ear lobe meets face 10.5        (Blank rows=not tested)       TODAY'S TREATMENT:                                                                                                                                         DATE:  Began MLD using Norton anterior approach as  follows: short neck, 5 diaphragmatic breaths, bilateral axillary nodes, bilateral pectoral nodes, anterior chest, short neck, posterior neck moving fluid towards pathway aimed at lateral neck, then lateral and anterior neck moving fluid towards pathway aimed at lateral neck then retracing all steps  while educating pt and daughter in basic principles of MLD and anatomy and physiology of the lymphatic system  PATIENT EDUCATION:  Education details: basic principles of MLD, anatomy and physiology of the lymphatic system Person educated: Patient Education method: Customer service manager Education comprehension: verbalized understanding  HOME EXERCISE PROGRAM: None yet, advised pt not to use heat or ice  ASSESSMENT:  CLINICAL IMPRESSION: Patient is a 76 y.o. female who was seen today for physical therapy evaluation and treatment for L sided facial swelling following a craniotomy 8 weeks ago. Her swelling initially improved but then worsened after she had COVID. She has increased swelling at temple, L cheek, L eyelid, and L ear. Began MLD today to this area with improvements noted by end of session. Also began scar mobilization to help improve lymphatic flow. Pt would benefit from skilled PT services to decrease L sided facial swelling, improve scar mobility and instruct pt's daughter in correct MLD technique so she is able to assist pt at home. Pt has some cognitive issues following craniotomy and chemo/radiation for treatment of a glioblastoma.    OBJECTIVE IMPAIRMENTS: decreased knowledge of condition, decreased knowledge of use of DME, and increased edema.   ACTIVITY LIMITATIONS:  none  PARTICIPATION LIMITATIONS:  none  PERSONAL FACTORS:  pt's change in cognition as a result of treatment for the glioblastoma  are also affecting patient's functional outcome.   REHAB POTENTIAL: Good  CLINICAL DECISION MAKING: Stable/uncomplicated  EVALUATION COMPLEXITY: Low  GOALS: Goals reviewed  with patient? Yes  SHORT TERM GOALS=LONG TERM GOALS Target date: 07/18/2022    Pt and/or daughter will be independent in self MLD for long term management of edema. Baseline: Goal status: INITIAL  2.  Pt will demonstrate a 0.2 cm decrease in edema from edge of mouth to where ear lobe meets face to improve comfort.  Baseline:  Goal status: INITIAL  3.  Pt will obtain a compression garment if deemed needed for long term management of lymphedema.  Baseline:  Goal status: INITIAL  PLAN:  PT FREQUENCY: 2x/week  PT DURATION: 4 weeks  PLANNED INTERVENTIONS: Therapeutic exercises, Patient/Family education, Self Care, Manual lymph drainage, Compression bandaging, scar mobilization, Taping, and Manual  therapy  PLAN FOR NEXT SESSION: instruct daughter in MLD technique for head and neck using norton approach and issue handout, does she need head and neck garment? [place]?   [facility], PT 06/20/2022, 5:15 PM

## 2022-06-21 ENCOUNTER — Ambulatory Visit
Admission: RE | Admit: 2022-06-21 | Discharge: 2022-06-21 | Disposition: A | Discharge status: Home / Self Care | Payer: Medicare PPO | Source: Ambulatory Visit | Attending: Radiation Oncology | Admitting: Radiation Oncology

## 2022-06-21 ENCOUNTER — Other Ambulatory Visit: Payer: Self-pay

## 2022-06-21 DIAGNOSIS — Z510 Encounter for antineoplastic radiation therapy: Secondary | ICD-10-CM | POA: Diagnosis not present

## 2022-06-21 DIAGNOSIS — C712 Malignant neoplasm of temporal lobe: Secondary | ICD-10-CM | POA: Diagnosis not present

## 2022-06-21 DIAGNOSIS — E785 Hyperlipidemia, unspecified: Secondary | ICD-10-CM | POA: Diagnosis not present

## 2022-06-21 DIAGNOSIS — G4733 Obstructive sleep apnea (adult) (pediatric): Secondary | ICD-10-CM | POA: Diagnosis not present

## 2022-06-21 DIAGNOSIS — Z9221 Personal history of antineoplastic chemotherapy: Secondary | ICD-10-CM | POA: Diagnosis not present

## 2022-06-21 DIAGNOSIS — I341 Nonrheumatic mitral (valve) prolapse: Secondary | ICD-10-CM | POA: Diagnosis not present

## 2022-06-21 DIAGNOSIS — E039 Hypothyroidism, unspecified: Secondary | ICD-10-CM | POA: Diagnosis not present

## 2022-06-21 DIAGNOSIS — Z923 Personal history of irradiation: Secondary | ICD-10-CM | POA: Diagnosis not present

## 2022-06-21 DIAGNOSIS — I10 Essential (primary) hypertension: Secondary | ICD-10-CM | POA: Diagnosis not present

## 2022-06-21 LAB — RAD ONC ARIA SESSION SUMMARY
Course Elapsed Days: 31
Plan Fractions Treated to Date: 1
Plan Prescribed Dose Per Fraction: 2 Gy
Plan Total Fractions Prescribed: 7
Plan Total Prescribed Dose: 14 Gy
Reference Point Dosage Given to Date: 2 Gy
Reference Point Session Dosage Given: 2 Gy
Session Number: 24

## 2022-06-22 ENCOUNTER — Ambulatory Visit
Admission: RE | Admit: 2022-06-22 | Discharge: 2022-06-22 | Disposition: A | Discharge status: Home / Self Care | Payer: Medicare PPO | Source: Ambulatory Visit | Attending: Radiation Oncology | Admitting: Radiation Oncology

## 2022-06-22 ENCOUNTER — Other Ambulatory Visit: Payer: Self-pay

## 2022-06-22 DIAGNOSIS — I341 Nonrheumatic mitral (valve) prolapse: Secondary | ICD-10-CM | POA: Diagnosis not present

## 2022-06-22 DIAGNOSIS — I10 Essential (primary) hypertension: Secondary | ICD-10-CM | POA: Diagnosis not present

## 2022-06-22 DIAGNOSIS — Z510 Encounter for antineoplastic radiation therapy: Secondary | ICD-10-CM | POA: Diagnosis not present

## 2022-06-22 DIAGNOSIS — C712 Malignant neoplasm of temporal lobe: Secondary | ICD-10-CM | POA: Diagnosis not present

## 2022-06-22 DIAGNOSIS — E785 Hyperlipidemia, unspecified: Secondary | ICD-10-CM | POA: Diagnosis not present

## 2022-06-22 DIAGNOSIS — G4733 Obstructive sleep apnea (adult) (pediatric): Secondary | ICD-10-CM | POA: Diagnosis not present

## 2022-06-22 DIAGNOSIS — Z923 Personal history of irradiation: Secondary | ICD-10-CM | POA: Diagnosis not present

## 2022-06-22 DIAGNOSIS — E039 Hypothyroidism, unspecified: Secondary | ICD-10-CM | POA: Diagnosis not present

## 2022-06-22 DIAGNOSIS — Z9221 Personal history of antineoplastic chemotherapy: Secondary | ICD-10-CM | POA: Diagnosis not present

## 2022-06-22 LAB — RAD ONC ARIA SESSION SUMMARY
Course Elapsed Days: 32
Plan Fractions Treated to Date: 2
Plan Prescribed Dose Per Fraction: 2 Gy
Plan Total Fractions Prescribed: 7
Plan Total Prescribed Dose: 14 Gy
Reference Point Dosage Given to Date: 4 Gy
Reference Point Session Dosage Given: 2 Gy
Session Number: 25

## 2022-06-25 ENCOUNTER — Ambulatory Visit
Admission: RE | Admit: 2022-06-25 | Discharge: 2022-06-25 | Disposition: A | Discharge status: Home / Self Care | Payer: Medicare PPO | Source: Ambulatory Visit | Attending: Radiation Oncology | Admitting: Radiation Oncology

## 2022-06-25 ENCOUNTER — Other Ambulatory Visit: Payer: Self-pay

## 2022-06-25 ENCOUNTER — Other Ambulatory Visit (HOSPITAL_COMMUNITY): Payer: Self-pay

## 2022-06-25 DIAGNOSIS — E039 Hypothyroidism, unspecified: Secondary | ICD-10-CM | POA: Diagnosis not present

## 2022-06-25 DIAGNOSIS — Z923 Personal history of irradiation: Secondary | ICD-10-CM | POA: Diagnosis not present

## 2022-06-25 DIAGNOSIS — I341 Nonrheumatic mitral (valve) prolapse: Secondary | ICD-10-CM | POA: Diagnosis not present

## 2022-06-25 DIAGNOSIS — C712 Malignant neoplasm of temporal lobe: Secondary | ICD-10-CM | POA: Diagnosis not present

## 2022-06-25 DIAGNOSIS — Z510 Encounter for antineoplastic radiation therapy: Secondary | ICD-10-CM | POA: Diagnosis not present

## 2022-06-25 DIAGNOSIS — E785 Hyperlipidemia, unspecified: Secondary | ICD-10-CM | POA: Diagnosis not present

## 2022-06-25 DIAGNOSIS — Z9221 Personal history of antineoplastic chemotherapy: Secondary | ICD-10-CM | POA: Diagnosis not present

## 2022-06-25 DIAGNOSIS — G4733 Obstructive sleep apnea (adult) (pediatric): Secondary | ICD-10-CM | POA: Diagnosis not present

## 2022-06-25 DIAGNOSIS — I10 Essential (primary) hypertension: Secondary | ICD-10-CM | POA: Diagnosis not present

## 2022-06-25 LAB — RAD ONC ARIA SESSION SUMMARY
Course Elapsed Days: 35
Plan Fractions Treated to Date: 3
Plan Prescribed Dose Per Fraction: 2 Gy
Plan Total Fractions Prescribed: 7
Plan Total Prescribed Dose: 14 Gy
Reference Point Dosage Given to Date: 6 Gy
Reference Point Session Dosage Given: 2 Gy
Session Number: 26

## 2022-06-26 ENCOUNTER — Ambulatory Visit
Admission: RE | Admit: 2022-06-26 | Discharge: 2022-06-26 | Disposition: A | Discharge status: Home / Self Care | Payer: Medicare PPO | Source: Ambulatory Visit | Attending: Radiation Oncology | Admitting: Radiation Oncology

## 2022-06-26 ENCOUNTER — Inpatient Hospital Stay: Payer: Medicare PPO | Admitting: Nurse Practitioner

## 2022-06-26 ENCOUNTER — Other Ambulatory Visit: Payer: Self-pay

## 2022-06-26 DIAGNOSIS — H903 Sensorineural hearing loss, bilateral: Secondary | ICD-10-CM | POA: Diagnosis not present

## 2022-06-26 DIAGNOSIS — Z9221 Personal history of antineoplastic chemotherapy: Secondary | ICD-10-CM | POA: Diagnosis not present

## 2022-06-26 DIAGNOSIS — Z510 Encounter for antineoplastic radiation therapy: Secondary | ICD-10-CM | POA: Diagnosis not present

## 2022-06-26 DIAGNOSIS — H6122 Impacted cerumen, left ear: Secondary | ICD-10-CM | POA: Diagnosis not present

## 2022-06-26 DIAGNOSIS — I341 Nonrheumatic mitral (valve) prolapse: Secondary | ICD-10-CM | POA: Diagnosis not present

## 2022-06-26 DIAGNOSIS — G4733 Obstructive sleep apnea (adult) (pediatric): Secondary | ICD-10-CM | POA: Diagnosis not present

## 2022-06-26 DIAGNOSIS — C712 Malignant neoplasm of temporal lobe: Secondary | ICD-10-CM | POA: Diagnosis not present

## 2022-06-26 DIAGNOSIS — Z923 Personal history of irradiation: Secondary | ICD-10-CM | POA: Diagnosis not present

## 2022-06-26 DIAGNOSIS — H6522 Chronic serous otitis media, left ear: Secondary | ICD-10-CM | POA: Diagnosis not present

## 2022-06-26 DIAGNOSIS — I10 Essential (primary) hypertension: Secondary | ICD-10-CM | POA: Diagnosis not present

## 2022-06-26 DIAGNOSIS — E785 Hyperlipidemia, unspecified: Secondary | ICD-10-CM | POA: Diagnosis not present

## 2022-06-26 DIAGNOSIS — H6992 Unspecified Eustachian tube disorder, left ear: Secondary | ICD-10-CM | POA: Diagnosis not present

## 2022-06-26 DIAGNOSIS — E039 Hypothyroidism, unspecified: Secondary | ICD-10-CM | POA: Diagnosis not present

## 2022-06-26 LAB — RAD ONC ARIA SESSION SUMMARY
Course Elapsed Days: 36
Plan Fractions Treated to Date: 4
Plan Prescribed Dose Per Fraction: 2 Gy
Plan Total Fractions Prescribed: 7
Plan Total Prescribed Dose: 14 Gy
Reference Point Dosage Given to Date: 8 Gy
Reference Point Session Dosage Given: 2 Gy
Session Number: 27

## 2022-06-27 ENCOUNTER — Other Ambulatory Visit: Payer: Self-pay

## 2022-06-27 ENCOUNTER — Ambulatory Visit
Admission: RE | Admit: 2022-06-27 | Discharge: 2022-06-27 | Disposition: A | Discharge status: Home / Self Care | Payer: Medicare PPO | Source: Ambulatory Visit | Attending: Radiation Oncology | Admitting: Radiation Oncology

## 2022-06-27 ENCOUNTER — Ambulatory Visit: Payer: Medicare PPO | Admitting: Physical Therapy

## 2022-06-27 ENCOUNTER — Encounter: Payer: Self-pay | Admitting: Physical Therapy

## 2022-06-27 DIAGNOSIS — Z9221 Personal history of antineoplastic chemotherapy: Secondary | ICD-10-CM | POA: Diagnosis not present

## 2022-06-27 DIAGNOSIS — G4733 Obstructive sleep apnea (adult) (pediatric): Secondary | ICD-10-CM | POA: Diagnosis not present

## 2022-06-27 DIAGNOSIS — C712 Malignant neoplasm of temporal lobe: Secondary | ICD-10-CM | POA: Diagnosis not present

## 2022-06-27 DIAGNOSIS — E785 Hyperlipidemia, unspecified: Secondary | ICD-10-CM | POA: Diagnosis not present

## 2022-06-27 DIAGNOSIS — Z8616 Personal history of COVID-19: Secondary | ICD-10-CM | POA: Diagnosis not present

## 2022-06-27 DIAGNOSIS — R600 Localized edema: Secondary | ICD-10-CM | POA: Diagnosis not present

## 2022-06-27 DIAGNOSIS — I341 Nonrheumatic mitral (valve) prolapse: Secondary | ICD-10-CM | POA: Diagnosis not present

## 2022-06-27 DIAGNOSIS — I890 Lymphedema, not elsewhere classified: Secondary | ICD-10-CM | POA: Diagnosis not present

## 2022-06-27 DIAGNOSIS — Z510 Encounter for antineoplastic radiation therapy: Secondary | ICD-10-CM | POA: Diagnosis not present

## 2022-06-27 DIAGNOSIS — I10 Essential (primary) hypertension: Secondary | ICD-10-CM | POA: Diagnosis not present

## 2022-06-27 DIAGNOSIS — E039 Hypothyroidism, unspecified: Secondary | ICD-10-CM | POA: Diagnosis not present

## 2022-06-27 DIAGNOSIS — Z923 Personal history of irradiation: Secondary | ICD-10-CM | POA: Diagnosis not present

## 2022-06-27 LAB — RAD ONC ARIA SESSION SUMMARY
Course Elapsed Days: 37
Plan Fractions Treated to Date: 5
Plan Prescribed Dose Per Fraction: 2 Gy
Plan Total Fractions Prescribed: 7
Plan Total Prescribed Dose: 14 Gy
Reference Point Dosage Given to Date: 10 Gy
Reference Point Session Dosage Given: 2 Gy
Session Number: 28

## 2022-06-27 NOTE — Therapy (Addendum)
 OUTPATIENT PHYSICAL THERAPY ONCOLOGY TREATMENT  Patient Name: Yvonne [name] MRN: [id number] DOB:[birth year], 76 y.o., female Today's Date: 06/27/2022   PT End of Session - 06/27/22 1204     Visit Number 2    Number of Visits 9    Date for PT Re-Evaluation 07/18/22    PT Start Time 1202    Activity Tolerance Patient tolerated treatment well    Behavior During Therapy [facility] for tasks assessed/performed             Past Medical History:  Diagnosis Date   Anxiety    Chest pressure    Degenerative arthritis    Deviated septum    Diabetes mellitus without complication (HCC)    diet controlled   Diabetic neuropathy (HCC) 12/03/2017   DJD (degenerative joint disease) of knee    Family history of bladder cancer    Family history of brain cancer    Family history of breast cancer    Family history of ovarian cancer    Family history of prostate cancer    Family history of stomach cancer    Family history of throat cancer    Family history of uterine cancer    Graves disease    RADIOACTIVE IODINE 1983   H/O hiatal hernia    Heart palpitations    History of diastolic dysfunction    per echo in 2011   Hyperlipidemia    Hypertension    Hypothyroidism    LVH (left ventricular hypertrophy)    per echo in 2011   Mitral valve prolapse    Neuropathy, peripheral    DR. Anne [name] 12/15   Normal nuclear stress test 2011   OSA (obstructive sleep apnea)    Polyneuropathy in other diseases classified elsewhere (HCC) 05/21/2013   Rosacea 2019   MELANOMA CHEST REMOVED IN SITU   Sleep difficulties    Tinnitus    Past Surgical History:  Procedure Laterality Date   ABDOMINAL HYSTERECTOMY  2000   TOTAL   APPENDECTOMY  1989   CARDIOVASCULAR STRESS TEST  11/09/2009   EF 75%   CARPAL TUNNEL RELEASE  2002   bilateral   COLONOSCOPY WITH PROPOFOL N/A 10/23/2018   Procedure: COLONOSCOPY WITH PROPOFOL;  Surgeon: Yvonne Fee, MD;  Location: [facility];  Service: Endoscopy;  Laterality:  N/A;   ESOPHAGOGASTRODUODENOSCOPY N/A 11/16/2021   Procedure: ESOPHAGOGASTRODUODENOSCOPY (EGD);  Surgeon: Yvonne Fee, MD;  Location: [facility];  Service: Endoscopy;  Laterality: N/A;   ESOPHAGOGASTRODUODENOSCOPY (EGD) WITH PROPOFOL N/A 10/23/2018   Procedure: ESOPHAGOGASTRODUODENOSCOPY (EGD) WITH PROPOFOL;  Surgeon: Yvonne Fee, MD;  Location: [facility];  Service: Endoscopy;  Laterality: N/A;   EUS N/A 10/23/2018   Procedure: UPPER ENDOSCOPIC ULTRASOUND (EUS) RADIAL;  Surgeon: Yvonne Fee, MD;  Location: [facility];  Service: Endoscopy;  Laterality: N/A;   EUS N/A 11/16/2021   Procedure: UPPER ENDOSCOPIC ULTRASOUND (EUS) RADIAL;  Surgeon: Yvonne Fee, MD;  Location: [facility];  Service: Endoscopy;  Laterality: N/A;   MASS EXCISION Left 06/02/2019   Procedure: EXCISION OF SUBCUTANEOUS ABDOMINAL WALL MASSES LEFT LOWER QUADRANT;  Surgeon: Yvonne Rudd, MD;  Location: [facility] OR;  Service: General;  Laterality: Left;   TONSILLECTOMY  1958   TRANSTHORACIC ECHOCARDIOGRAM  11/01/2009   NORMAL LV FILLING AND DIASTOLIC DYSFUNCTION AND MILD AORTIC SCLEROSIS AND TRACE MITRAL REGURGITATION   Patient Active Problem List   Diagnosis Date Noted   Glioblastoma, IDH-wildtype (HCC) 05/03/2022   Acute ischemic stroke (HCC) 04/04/2022   Family  history of brain cancer    Family history of throat cancer    Family history of breast cancer    Family history of ovarian cancer    Family history of prostate cancer    Family history of stomach cancer    Family history of bladder cancer    Family history of uterine cancer    Anxiety 10/27/2020   Daytime somnolence 10/27/2020   Diabetic peripheral neuropathy associated with type 2 diabetes mellitus (HCC) 10/27/2020   Excessive sleepiness 10/27/2020   Hypertension 10/27/2020   Obstructive sleep apnea syndrome 10/27/2020   Peripheral neuropathy 10/27/2020   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 10/27/2020   Rosacea 10/27/2020    Vitamin D deficiency 10/27/2020   Deviated septum 05/09/2020   Trochanteric bursitis of left hip 12/30/2019   Special screening for malignant neoplasms, colon    Diverticulosis of colon without hemorrhage    Family history of pancreatic cancer    Pancreatic cyst    Osteoarthritis of knee 12/04/2017   Diabetic neuropathy (HCC) 12/03/2017   Newly diagnosed diabetes (HCC) 06/20/2017   Bilateral hearing loss 05/28/2017   Bilateral impacted cerumen 05/28/2017   Seasonal allergic rhinitis 05/28/2017   Tinnitus, bilateral 05/28/2017   Genetic testing 10/15/2016   DM2 (diabetes mellitus, type 2) (HCC) 01/10/2016   Polyneuropathy in other diseases classified elsewhere (HCC) 05/21/2013   History of hyperglycemia 04/01/2012   Atypical chest pain 12/28/2011   Benign hypertensive heart disease without heart failure 02/05/2011   Hyperlipidemia 02/05/2011   Hypothyroidism 02/05/2011   Palpitations 02/05/2011    PCP: Yvonne Hazel, MD  REFERRING PROVIDER: Ronny Bacon, PA-C   REFERRING DIAG: C71.2 (ICD-10-CM) - Glioblastoma of temporal lobe (HCC) R60.0 (ICD-10-CM) - Facial edema    THERAPY DIAG:  No diagnosis found.  ONSET DATE: 03/2022  Rationale for Evaluation and Treatment: Rehabilitation  SUBJECTIVE:                                                                                                                                                                                           SUBJECTIVE STATEMENT: I am still having trouble with tinnitis PERTINENT HISTORY:   L temporal glioblastoma -Yvonne [name] presented to medical attention in August 2023 with episode of sudden onset speech impairment.  Initially treated as stroke with TPA, CNS imaging later demonstrated enhancing mass in the left temporal lobe, c/w primary brain tumor.  She underwent resection at [facility] with Dr. Zachery [name] after second opinion there.  Following surgery she experienced some speech impairment; this has  improved modestly, but she still experiences difficulty with finding correct words.  Also  struggling with short term memory.  She is currently living with her daughter given cognitive limitations, need for some supervision.  Currently undergoing chemoradiation and presents with some facial swelling.  currently undergoing chemoradiation for glioma of the temporal lobe.  She has received 21 treatments to date.  She has been having muffled hearing in her left ear, as well as feeling fullness in the left ear, and episodes of waxing and waning facial edema on the left side.  Of note she recently had COVID and her presurgical and pretreatment imaging including her postop films from [facility] do show a left mastoid effusion and left craniotomy changes including subcutaneous edema and gas.  No infections have been recently identified in her white blood cell count was normal range about a week ago.  She has not had any known fevers.  Her facial edema came on after her COVID diagnosis. On exam she has asymmetry in her left eyelids more noticeable in the upper eyelid on the left, a well-healed temporal craniotomy incision site, and the skin is hyperemic and tight over this area with palpable bone but no palpable crepitus.  No evidence of cellulitic appearing change or separation of her incision appreciated.  She does have asymmetry and mild edema as well on the upper left cheek extending to the tragus of the ear.  There is asymmetry as well of the left pinna but no palpable fluctuance.  An otoscope for the right external auditory canal has mild dry cerumen and a normal-appearing tympanic membrane.  Evaluation of the left ear with otoscope showed mild cerumen without impaction and what appears to be a tympanic effusion no cloudiness or evidence of injection of the tympanic membrane was appreciated. She may have some lymphatic congestion as a result of her recent infections, complicating the fact that she is already had baseline  effusion and axillary sinus thickening based on prior imaging. PAIN:  Are you having pain? No pt reports no pain but feeling of thickness in her ear  PRECAUTIONS: None undergoing chemo and radiation for glioblastoma  WEIGHT BEARING RESTRICTIONS: No  FALLS:  Has patient fallen in last 6 months? No  LIVING ENVIRONMENT: Lives with: lives with their daughter Lives in: House/apartment Has following equipment at home: None  OCCUPATION: retired  LEISURE: pt walks daily several times a day  HAND DOMINANCE: right   PRIOR LEVEL OF FUNCTION: Independent  PATIENT GOALS: to get the swelling down and assist with the drainage of fluid on the left side   OBJECTIVE:  COGNITION: Overall cognitive status: Impaired   PALPATION: Decreased scar mobility just anterior to L ear, increased thickness from swelling in L side of face  OBSERVATIONS / OTHER ASSESSMENTS: increased puffiness in L eyelid, at left temple, L cheek and L ear  POSTURE: forward head and rounded shoulders  LYMPHEDEMA ASSESSMENT:    Circumference in cm  4 cm superior to sternal notch around neck   6 cm superior to sternal notch around neck   8 cm superior to sternal notch around neck   R lateral nostril from base of nose to medial tragus 12.5  L lateral nostril from base of nose to medial tragus 12.9  R corner of mouth to where ear lobe meets face 10  L corner of mouth to where ear lobe meets face 10.5        (Blank rows=not tested)       TODAY'S TREATMENT:  DATE:  06/27/22:  Continued MLD using Norton anterior approach as follows while seated in a chair in front of the mirror: short neck, 5 diaphragmatic breaths, bilateral axillary nodes, bilateral pectoral nodes, anterior chest, short neck, posterior neck moving fluid towards pathway aimed at lateral neck, then lateral and  anterior neck moving fluid towards pathway aimed at lateral neck then retracing all steps  while educating pt and friend in correct skin stretch technique and sequence and having pt return demonstrate correct skin stretch technique while providing pt with appropriate v/c and t/c cues for correct skin stretch, direction and sequence Scar mobilization to craniotomy scar   06/20/22: Began MLD using [facility] anterior approach as follows: short neck, 5 diaphragmatic breaths, bilateral axillary nodes, bilateral pectoral nodes, anterior chest, short neck, posterior neck moving fluid towards pathway aimed at lateral neck, then lateral and anterior neck moving fluid towards pathway aimed at lateral neck then retracing all steps  while educating pt and daughter in basic principles of MLD and anatomy and physiology of the lymphatic system  PATIENT EDUCATION:  Education details: basic principles of MLD, anatomy and physiology of the lymphatic system Person educated: Patient Education method: Explanation and Demonstration Education comprehension: verbalized understanding  HOME EXERCISE PROGRAM: None yet, advised pt not to use heat or ice  ASSESSMENT:  CLINICAL IMPRESSION: Pt returns to PT today. Began instructing her in self MLD using Norton anterior approach handout. Pt's friend accompanied her and made notes on her instructions to help her at home and also to show to pt's daughter who may be helping with self MLD. Pt began return demonstrating correct skin stretch technique today but will need continued instruction. Educated pt about scar mobilization as well to help improve lymphatic drainage.   OBJECTIVE IMPAIRMENTS: decreased knowledge of condition, decreased knowledge of use of DME, and increased edema.   ACTIVITY LIMITATIONS:  none  PARTICIPATION LIMITATIONS:  none  PERSONAL FACTORS:  pt's change in cognition as a result of treatment for the glioblastoma  are also affecting patient's functional  outcome.   REHAB POTENTIAL: Good  CLINICAL DECISION MAKING: Stable/uncomplicated  EVALUATION COMPLEXITY: Low  GOALS: Goals reviewed with patient? Yes  SHORT TERM GOALS=LONG TERM GOALS Target date: 07/25/2022    Pt and/or daughter will be independent in self MLD for long term management of edema. Baseline: Goal status: INITIAL  2.  Pt will demonstrate a 0.2 cm decrease in edema from edge of mouth to where ear lobe meets face to improve comfort.  Baseline:  Goal status: INITIAL  3.  Pt will obtain a compression garment if deemed needed for long term management of lymphedema.  Baseline:  Goal status: INITIAL  PLAN:  PT FREQUENCY: 2x/week  PT DURATION: 4 weeks  PLANNED INTERVENTIONS: Therapeutic exercises, Patient/Family education, Self Care, Manual lymph drainage, Compression bandaging, scar mobilization, Taping, and Manual therapy  PLAN FOR NEXT SESSION: instruct daughter in MLD technique for head and neck using norton approach and issue handout, does she need head and neck garment? Marena?   Vontrell Pullman Breedlove [place], PT 06/27/2022, 1:12 PM   PHYSICAL THERAPY DISCHARGE SUMMARY  Visits from Start of Care: 2  Current functional level related to goals / functional outcomes: See above   Remaining deficits: See above   Education / Equipment: Self MLD, compression garment   Patient agrees to discharge. Patient goals were not met. Patient is being discharged due to not returning since the last visit.  Milagros Loll Goff, [place] 10/29/23 10:55 AM

## 2022-06-28 ENCOUNTER — Inpatient Hospital Stay (HOSPITAL_BASED_OUTPATIENT_CLINIC_OR_DEPARTMENT_OTHER): Payer: Medicare PPO | Admitting: Internal Medicine

## 2022-06-28 ENCOUNTER — Other Ambulatory Visit: Payer: Self-pay | Admitting: *Deleted

## 2022-06-28 ENCOUNTER — Ambulatory Visit
Admission: RE | Admit: 2022-06-28 | Discharge: 2022-06-28 | Disposition: A | Discharge status: Home / Self Care | Payer: Medicare PPO | Source: Ambulatory Visit | Attending: Radiation Oncology | Admitting: Radiation Oncology

## 2022-06-28 ENCOUNTER — Inpatient Hospital Stay: Payer: Medicare PPO

## 2022-06-28 ENCOUNTER — Other Ambulatory Visit: Payer: Self-pay

## 2022-06-28 VITALS — BP 148/75 | HR 97 | Temp 97.3°F | Resp 18 | Wt 148.2 lb

## 2022-06-28 DIAGNOSIS — C719 Malignant neoplasm of brain, unspecified: Secondary | ICD-10-CM

## 2022-06-28 DIAGNOSIS — I341 Nonrheumatic mitral (valve) prolapse: Secondary | ICD-10-CM | POA: Diagnosis not present

## 2022-06-28 DIAGNOSIS — Z923 Personal history of irradiation: Secondary | ICD-10-CM | POA: Diagnosis not present

## 2022-06-28 DIAGNOSIS — Z800 Family history of malignant neoplasm of digestive organs: Secondary | ICD-10-CM

## 2022-06-28 DIAGNOSIS — E039 Hypothyroidism, unspecified: Secondary | ICD-10-CM | POA: Diagnosis not present

## 2022-06-28 DIAGNOSIS — E785 Hyperlipidemia, unspecified: Secondary | ICD-10-CM | POA: Diagnosis not present

## 2022-06-28 DIAGNOSIS — I10 Essential (primary) hypertension: Secondary | ICD-10-CM | POA: Diagnosis not present

## 2022-06-28 DIAGNOSIS — G4733 Obstructive sleep apnea (adult) (pediatric): Secondary | ICD-10-CM | POA: Diagnosis not present

## 2022-06-28 DIAGNOSIS — Z510 Encounter for antineoplastic radiation therapy: Secondary | ICD-10-CM | POA: Diagnosis not present

## 2022-06-28 DIAGNOSIS — Z9221 Personal history of antineoplastic chemotherapy: Secondary | ICD-10-CM | POA: Diagnosis not present

## 2022-06-28 DIAGNOSIS — C712 Malignant neoplasm of temporal lobe: Secondary | ICD-10-CM | POA: Diagnosis not present

## 2022-06-28 LAB — CMP (CANCER CENTER ONLY)
ALT: 15 U/L (ref 0–44)
AST: 11 U/L — ABNORMAL LOW (ref 15–41)
Albumin: 4.4 g/dL (ref 3.5–5.0)
Alkaline Phosphatase: 50 U/L (ref 38–126)
Anion gap: 6 (ref 5–15)
BUN: 20 mg/dL (ref 8–23)
CO2: 29 mmol/L (ref 22–32)
Calcium: 9.6 mg/dL (ref 8.9–10.3)
Chloride: 100 mmol/L (ref 98–111)
Creatinine: 0.84 mg/dL (ref 0.44–1.00)
GFR, Estimated: >60 mL/min (ref >60)
Glucose, Bld: 223 mg/dL — ABNORMAL HIGH (ref 70–99)
Potassium: 3.9 mmol/L (ref 3.5–5.1)
Sodium: 135 mmol/L (ref 135–145)
Total Bilirubin: 0.5 mg/dL (ref 0.3–1.2)
Total Protein: 7 g/dL (ref 6.5–8.1)

## 2022-06-28 LAB — CBC WITH DIFFERENTIAL (CANCER CENTER ONLY)
Abs Immature Granulocytes: 0.02 10*3/uL (ref 0.00–0.07)
Basophils Absolute: 0 10*3/uL (ref 0.0–0.1)
Basophils Relative: 0 %
Eosinophils Absolute: 0.3 10*3/uL (ref 0.0–0.5)
Eosinophils Relative: 4 %
HCT: 38.4 % (ref 36.0–46.0)
Hemoglobin: 13.2 g/dL (ref 12.0–15.0)
Immature Granulocytes: 0 %
Lymphocytes Relative: 7 %
Lymphs Abs: 0.6 10*3/uL — ABNORMAL LOW (ref 0.7–4.0)
MCH: 27.5 pg (ref 26.0–34.0)
MCHC: 34.4 g/dL (ref 30.0–36.0)
MCV: 80 fL (ref 80.0–100.0)
Monocytes Absolute: 0.7 10*3/uL (ref 0.1–1.0)
Monocytes Relative: 9 %
Neutro Abs: 6.4 10*3/uL (ref 1.7–7.7)
Neutrophils Relative %: 80 %
Platelet Count: 197 10*3/uL (ref 150–400)
RBC: 4.8 MIL/uL (ref 3.87–5.11)
RDW: 13.8 % (ref 11.5–15.5)
WBC Count: 8 10*3/uL (ref 4.0–10.5)
nRBC: 0 % (ref 0.0–0.2)

## 2022-06-28 LAB — RAD ONC ARIA SESSION SUMMARY
Course Elapsed Days: 38
Plan Fractions Treated to Date: 6
Plan Prescribed Dose Per Fraction: 2 Gy
Plan Total Fractions Prescribed: 7
Plan Total Prescribed Dose: 14 Gy
Reference Point Dosage Given to Date: 12 Gy
Reference Point Session Dosage Given: 2 Gy
Session Number: 29

## 2022-06-28 NOTE — Progress Notes (Signed)
[facility] [place], [place] [address] [phone]   Interval Evaluation  Date of Service: 06/28/22 Patient Name: Yvonne [name] Patient MRN: [id number] Patient DOB: 1946/05/03 Provider: Ventura Sellers, MD  Identifying Statement:  Yvonne [name] is a 76 y.o. female with left temporal glioblastoma    Oncologic History: Oncology History  Glioblastoma, IDH-wildtype ([place])  04/18/2022 Surgery   Left temporal craniotomy, resection with Dr. Tommi Rumps at [facility].  Path is glioblastoma, IDHwt   05/16/2022 -  Chemotherapy   [name] : BRAIN GLIOBLASTOMA Radiation Therapy With Concurrent Temozolomide 75 mg/m2 Daily Followed By Sequential Maintenance Temozolomide x 6-12 cycles       Biomarkers:  MGMT Unknown.  IDH 1/2 Wild type.  EGFR Unknown  TERT Unknown   Interval History: Yvonne [name] presents today for follow up, now in final week of radiation and Temodar.  She and her daughter describe no significant changes with regards to energy, cognition, speech.  Issues with short term memory, irritable behavior are stable.  No further seizures, no headaches.    H+P (05/03/22) Patient presented to medical attention in August 2023 with episode of sudden onset speech impairment.  Initially treated as stroke with TPA, CNS imaging later demonstrated enhancing mass in the left temporal lobe, c/w primary brain tumor.  She underwent resection at [facility] with Dr. Tommi Rumps after second opinion there.  Following surgery she experienced some speech impairment; this has improved modestly, but she still experiences difficulty with finding correct words.  Also struggling with short term memory.  She is currently living with her daughter given cognitive limitations, need for some supervision.  No further seizures with the Keppra 56m twice per day.  Currently on decadron 217mdaily per discharge taper from [place]   Medications: Current  Outpatient Medications on File Prior to Visit  Medication Sig Dispense Refill   acetaminophen (TYLENOL) 500 MG tablet Take 1,000 mg by mouth every 8 (eight) hours as needed for moderate pain.     Cholecalciferol (VITAMIN D) 50 MCG (2000 UT) tablet Take 2,000 Units by mouth daily.      gabapentin (NEURONTIN) 300 MG capsule Take 300 mg by mouth at bedtime.      levETIRAcetam (KEPPRA) 500 MG tablet Take 1 tablet (500 mg total) by mouth 2 (two) times daily. 60 tablet 2   levothyroxine (SYNTHROID, LEVOTHROID) 100 MCG tablet Take 100 mcg by mouth daily before breakfast.     metoprolol succinate (TOPROL-XL) 25 MG 24 hr tablet TAKE 1/2 TABLET BY MOUTH DAILY. 45 tablet 3   metroNIDAZOLE (METROCREAM) 0.75 % cream Apply 1 application  topically daily.     ondansetron (ZOFRAN) 8 MG tablet Take 1 tablet (8 mg total) by mouth every 8 (eight) hours as needed for nausea or vomiting. May take 30-60 minutes prior to Temodar administration if nausea/vomiting occurs as needed. 30 tablet 1   rosuvastatin (CRESTOR) 5 MG tablet Take 5 mg by mouth daily.     temozolomide (TEMODAR) 100 MG capsule Take 1 capsule (100 mg total) by mouth daily. May take on an empty stomach to decrease nausea & vomiting. 42 capsule 0   temozolomide (TEMODAR) 20 MG capsule Take 1 capsule (20 mg total) by mouth daily. May take on an empty stomach to decrease nausea & vomiting. 42 capsule 0   temozolomide (TEMODAR) 5 MG capsule Take 2 capsules (10 mg total) by mouth daily. May take on an  empty stomach to decrease nausea & vomiting. 84 capsule 0   fluticasone (FLONASE) 50 MCG/ACT nasal spray Place 2 sprays into both nostrils at bedtime. (Patient not taking: Reported on 06/28/2022)     furosemide (LASIX) 20 MG tablet Take 10 mg by mouth daily. (Patient not taking: Reported on 05/03/2022)     lamoTRIgine (LAMICTAL) 25 MG tablet Take 2 tablets (50 mg total) by mouth daily for 7 days, THEN 4 tablets (100 mg total) daily for 7 days. (Patient not taking:  Reported on 06/12/2022) 42 tablet 0   Current Facility-Administered Medications on File Prior to Visit  Medication Dose Route Frequency Provider Last Rate Last Admin   influenza vaccine adjuvanted (FLUAD) injection 0.5 mL  0.5 mL Intramuscular Once Konnor Jorden, Acey Lav, MD        Allergies:  Allergies  Allergen Reactions   Tobramycin Hives   Sulfa Antibiotics     Stomach upset   Erythromycin Nausea Only   Past Medical History:  Past Medical History:  Diagnosis Date   Anxiety    Chest pressure    Degenerative arthritis    Deviated septum    Diabetes mellitus without complication (HCC)    diet controlled   Diabetic neuropathy ([place]) 12/03/2017   DJD (degenerative joint disease) of knee    Family history of bladder cancer    Family history of brain cancer    Family history of breast cancer    Family history of ovarian cancer    Family history of prostate cancer    Family history of stomach cancer    Family history of throat cancer    Family history of uterine cancer    Graves disease    RADIOACTIVE IODINE 1983   H/O hiatal hernia    Heart palpitations    History of diastolic dysfunction    per echo in 2011   Hyperlipidemia    Hypertension    Hypothyroidism    LVH (left ventricular hypertrophy)    per echo in 2011   Mitral valve prolapse    Neuropathy, peripheral    DR. Jannifer Franklin 12/15   Normal nuclear stress test 2011   OSA (obstructive sleep apnea)    Polyneuropathy in other diseases classified elsewhere ([place]) 05/21/2013   Rosacea 2019   MELANOMA CHEST REMOVED IN SITU   Sleep difficulties    Tinnitus    Past Surgical History:  Past Surgical History:  Procedure Laterality Date   ABDOMINAL HYSTERECTOMY  2000   TOTAL   APPENDECTOMY  1989   CARDIOVASCULAR STRESS TEST  11/09/2009   EF 75%   CARPAL TUNNEL RELEASE  2002   bilateral   COLONOSCOPY WITH PROPOFOL N/A 10/23/2018   Procedure: COLONOSCOPY WITH PROPOFOL;  Surgeon: [name], MD;  Location: [facility];   Service: Endoscopy;  Laterality: N/A;   ESOPHAGOGASTRODUODENOSCOPY N/A 11/16/2021   Procedure: ESOPHAGOGASTRODUODENOSCOPY (EGD);  Surgeon: [name], MD;  Location: [facility];  Service: Endoscopy;  Laterality: N/A;   ESOPHAGOGASTRODUODENOSCOPY (EGD) WITH PROPOFOL N/A 10/23/2018   Procedure: ESOPHAGOGASTRODUODENOSCOPY (EGD) WITH PROPOFOL;  Surgeon: [name], MD;  Location: [facility];  Service: Endoscopy;  Laterality: N/A;   EUS N/A 10/23/2018   Procedure: UPPER ENDOSCOPIC ULTRASOUND (EUS) RADIAL;  Surgeon: [name], MD;  Location: [facility];  Service: Endoscopy;  Laterality: N/A;   EUS N/A 11/16/2021   Procedure: UPPER ENDOSCOPIC ULTRASOUND (EUS) RADIAL;  Surgeon: [name], MD;  Location: [facility];  Service: Endoscopy;  Laterality: N/A;  MASS EXCISION Left 06/02/2019   Procedure: EXCISION OF SUBCUTANEOUS ABDOMINAL WALL MASSES LEFT LOWER QUADRANT;  Surgeon: [name], MD;  Location: [facility];  Service: General;  Laterality: Left;   [place] ECHOCARDIOGRAM  11/01/2009   NORMAL LV FILLING AND DIASTOLIC DYSFUNCTION AND MILD AORTIC SCLEROSIS AND TRACE MITRAL REGURGITATION   Social History:  Social History   Socioeconomic History   Marital status: Married    Spouse name: Edd Fabian   Number of children: 1   Years of education: COLLEGE   Highest education level: Not on file  Occupational History   Occupation: Retired  Tobacco Use   Smoking status: Former    Packs/day: 0.50    Years: 10.00    Total pack years: 5.00    Types: Cigarettes    Quit date: 08/14/1975    Years since quitting: 46.9   Smokeless tobacco: Never  Vaping Use   Vaping Use: Never used  Substance and Sexual Activity   Alcohol use: Yes    Comment: one glass of wine a week   Drug use: No   Sexual activity: Not on file  Other Topics Concern   Not on file  Social History Narrative   Lives at home w/ her husband, Edd Fabian.   Right-handed.   Drinks no caffeine  but does eat chocolate occasionally.   epworth sleepiness scale = 3 (01/10/16)   Social Determinants of Health   Financial Resource Strain: Medium Risk (06/15/2022)   Overall Financial Resource Strain (CARDIA)    Difficulty of Paying Living Expenses: Somewhat hard  Food Insecurity: No Food Insecurity (06/15/2022)   Hunger Vital Sign    Worried About Running Out of Food in the Last Year: Never true    Ran Out of Food in the Last Year: Never true  Transportation Needs: No Transportation Needs (06/15/2022)   PRAPARE - Hydrologist (Medical): No    Lack of Transportation (Non-Medical): No  Physical Activity: Not on file  Stress: Not on file  Social Connections: Not on file  Intimate Partner Violence: Not At Risk (06/15/2022)   Humiliation, Afraid, Rape, and Kick questionnaire    Fear of Current or Ex-Partner: No    Emotionally Abused: No    Physically Abused: No    Sexually Abused: No   Family History:  Family History  Problem Relation Age of Onset   Hypertension Mother    Heart disease Mother    Dementia Mother    Heart disease Father    Arthritis Father    Diabetes Father    Hypertension Father    Pancreatic cancer Brother 62       pancreatic   Other Brother        automobile accident   Throat cancer Maternal Uncle    Brain cancer Paternal Aunt 36       malignant brain cancer   Stroke Maternal Grandmother    Heart disease Maternal Grandfather    Pancreatic cancer Cousin 79       pancreatic (maternal first cousin)   Pancreatic cancer Cousin 13       pancreatic, smoker (maternal first cousin)   Breast cancer Cousin 70       breast (maternal first cousin)   Ovarian cancer Cousin    Uterine cancer Cousin 76       uterine (paternal first cousin)   Cancer Cousin 23       unknown primary (paternal first cousin)  Bladder Cancer Cousin 76       (paternal first cousin)   Lung cancer Cousin    Throat cancer Cousin    Prostate cancer Cousin         (maternal first cousin)   Stomach cancer Cousin 34       (maternal first cousin)   Cancer Other 26       pancreatic neuroendocrine carcinoma   Colon cancer Neg Hx    Neuropathy Neg Hx     Review of Systems: Constitutional: Doesn't report fevers, chills or abnormal weight loss Eyes: Doesn't report blurriness of vision Ears, nose, mouth, throat, and face: Doesn't report sore throat Respiratory: Doesn't report cough, dyspnea or wheezes Cardiovascular: Doesn't report palpitation, chest discomfort  Gastrointestinal:  Doesn't report nausea, constipation, diarrhea GU: Doesn't report incontinence Skin: Doesn't report skin rashes Neurological: Per HPI Musculoskeletal: Doesn't report joint pain Behavioral/Psych: Doesn't report anxiety  Physical Exam: Vitals:   06/28/22 1044  BP: (!) 148/75  Pulse: 97  Resp: 18  Temp: (!) 97.3 F (36.3 C)  SpO2: 98%    KPS: 70. General: Alert, cooperative, pleasant, in no acute distress Head: Normal EENT: No conjunctival injection or scleral icterus.  Lungs: Resp effort normal Cardiac: Regular rate Abdomen: Non-distended abdomen Skin: No rashes cyanosis or petechiae. Extremities: No clubbing or edema  Neurologic Exam: Mental Status: Awake, alert, attentive to examiner. Oriented to self and environment. Language is fluent but comprehension is markedly impaired.  Anterograde amnesia is noted, mild neglect, elements of agnosia. Cranial Nerves: Visual acuity is grossly normal. Visual fields are full. Extra-ocular movements intact. No ptosis. Face is symmetric Motor: Tone and bulk are normal. Power is full in both arms and legs. Reflexes are symmetric, no pathologic reflexes present.  Sensory: Intact to light touch Gait: Normal.   Labs: I have reviewed the data as listed    Component Value Date/Time   NA 135 06/28/2022 1010   K 3.9 06/28/2022 1010   CL 100 06/28/2022 1010   CO2 29 06/28/2022 1010   GLUCOSE 223 (H) 06/28/2022 1010   BUN 20  06/28/2022 1010   CREATININE 0.84 06/28/2022 1010   CREATININE 0.71 01/04/2016 0739   CALCIUM 9.6 06/28/2022 1010   PROT 7.0 06/28/2022 1010   PROT 6.7 10/03/2021 0918   ALBUMIN 4.4 06/28/2022 1010   AST 11 (L) 06/28/2022 1010   ALT 15 06/28/2022 1010   ALKPHOS 50 06/28/2022 1010   BILITOT 0.5 06/28/2022 1010   GFRNONAA >60 06/28/2022 1010   GFRAA >60 10/23/2019 1004   Lab Results  Component Value Date   WBC 8.0 06/28/2022   NEUTROABS 6.4 06/28/2022   HGB 13.2 06/28/2022   HCT 38.4 06/28/2022   MCV 80.0 06/28/2022   PLT 197 06/28/2022    Assessment/Plan Glioblastoma, IDH-wildtype ([place])  CHANDY TARMAN is clinically stable today.  Labs are within normal limits.  We ultimately recommended continuing with course of intensity modulated radiation therapy and concurrent daily Temozolomide.  Radiation will be administered Mon-Fri over 6 weeks, Temodar will be dosed at 34m/m2 to be given daily over 42 days.  We reviewed side effects of temodar, including fatigue, nausea/vomiting, constipation, and cytopenias.  Chemotherapy should be held for the following:  ANC less than 1,000  Platelets less than 100,000  LFT or creatinine greater than 2x ULN  If clinical concerns/contraindications develop  Keppra will con't 5041mBID for now.    She should resume her CPAP as discussed.  We ask that [place]  MIDA CORY return to clinic in 1 months following visit with [facility] team and MRI, or sooner as needed.  All questions were answered. The patient knows to call the clinic with any problems, questions or concerns. No barriers to learning were detected.  The total time spent in the encounter was 30 minutes and more than 50% was on counseling and review of test results   Ventura Sellers, MD Medical Director of Neuro-Oncology [facility] at [place] 06/28/22 10:54 AM

## 2022-06-29 ENCOUNTER — Ambulatory Visit
Admission: RE | Admit: 2022-06-29 | Discharge: 2022-06-29 | Disposition: A | Discharge status: Home / Self Care | Payer: Medicare PPO | Source: Ambulatory Visit | Attending: Radiation Oncology | Admitting: Radiation Oncology

## 2022-06-29 ENCOUNTER — Telehealth: Payer: Self-pay | Admitting: Internal Medicine

## 2022-06-29 ENCOUNTER — Other Ambulatory Visit: Payer: Self-pay

## 2022-06-29 ENCOUNTER — Encounter: Payer: Self-pay | Admitting: Radiation Oncology

## 2022-06-29 ENCOUNTER — Ambulatory Visit: Admission: RE | Admit: 2022-06-29 | Payer: Medicare PPO | Source: Ambulatory Visit

## 2022-06-29 DIAGNOSIS — Z510 Encounter for antineoplastic radiation therapy: Secondary | ICD-10-CM | POA: Diagnosis not present

## 2022-06-29 DIAGNOSIS — C712 Malignant neoplasm of temporal lobe: Secondary | ICD-10-CM | POA: Diagnosis not present

## 2022-06-29 DIAGNOSIS — Z9221 Personal history of antineoplastic chemotherapy: Secondary | ICD-10-CM | POA: Diagnosis not present

## 2022-06-29 DIAGNOSIS — I10 Essential (primary) hypertension: Secondary | ICD-10-CM | POA: Diagnosis not present

## 2022-06-29 DIAGNOSIS — I341 Nonrheumatic mitral (valve) prolapse: Secondary | ICD-10-CM | POA: Diagnosis not present

## 2022-06-29 DIAGNOSIS — E785 Hyperlipidemia, unspecified: Secondary | ICD-10-CM | POA: Diagnosis not present

## 2022-06-29 DIAGNOSIS — E039 Hypothyroidism, unspecified: Secondary | ICD-10-CM | POA: Diagnosis not present

## 2022-06-29 DIAGNOSIS — G4733 Obstructive sleep apnea (adult) (pediatric): Secondary | ICD-10-CM | POA: Diagnosis not present

## 2022-06-29 DIAGNOSIS — Z923 Personal history of irradiation: Secondary | ICD-10-CM | POA: Diagnosis not present

## 2022-06-29 LAB — RAD ONC ARIA SESSION SUMMARY
Course Elapsed Days: 39
Plan Fractions Treated to Date: 7
Plan Prescribed Dose Per Fraction: 2 Gy
Plan Total Fractions Prescribed: 7
Plan Total Prescribed Dose: 14 Gy
Reference Point Dosage Given to Date: 14 Gy
Reference Point Session Dosage Given: 2 Gy
Session Number: 30

## 2022-06-29 LAB — HEMOGLOBIN A1C
Hgb A1c MFr Bld: 7.6 % — ABNORMAL HIGH (ref 4.8–5.6)
Mean Plasma Glucose: 171.42 mg/dL

## 2022-06-29 NOTE — Telephone Encounter (Signed)
Per 11/16 los called and spoke to pt about appointment  

## 2022-06-30 LAB — CANCER ANTIGEN 19-9: CA 19-9: 10 U/mL (ref 0–35)

## 2022-07-03 ENCOUNTER — Encounter: Payer: Medicare PPO | Admitting: Physical Therapy

## 2022-07-04 ENCOUNTER — Other Ambulatory Visit (HOSPITAL_COMMUNITY): Payer: Self-pay

## 2022-07-06 ENCOUNTER — Other Ambulatory Visit (HOSPITAL_COMMUNITY): Payer: Self-pay

## 2022-07-09 ENCOUNTER — Encounter: Payer: Medicare PPO | Admitting: Physical Therapy

## 2022-07-09 ENCOUNTER — Encounter: Payer: Self-pay | Admitting: Internal Medicine

## 2022-07-09 NOTE — Progress Notes (Signed)
                                                                                                                                                             Patient Name: Yvonne [name] MRN: [id number] DOB: 1946/02/26 Referring Physician: Truitt Merle (Profile Not Attached) Date of Service: 06/29/2022 [facility], [place]                                                        End Of Treatment Note  Diagnoses: C71.2-Malignant neoplasm of temporal lobe  Cancer Staging:   Glioblastoma of the left temporal lobe   Intent: Curative  Radiation Treatment Dates: 05/21/2022 through 06/29/2022 Site Technique Total Dose (Gy) Dose per Fx (Gy) Completed Fx Beam Energies  Brain: Brain IMRT 46/46 2 23/23 6X  Brain: Brain_Bst IMRT 14/14 2 7/7 6X   Narrative: The patient tolerated radiation therapy relatively well. She did have facial edema and was seen by PT who helped with lymphatic massage techniques.   Plan: The patient will receive a call in about one month from the radiation oncology department. She will continue follow up with Dr. Mickeal Skinner as well.  ________________________________________________    Carola Rhine, [facility]

## 2022-07-10 DIAGNOSIS — Z1231 Encounter for screening mammogram for malignant neoplasm of breast: Secondary | ICD-10-CM | POA: Diagnosis not present

## 2022-07-11 ENCOUNTER — Other Ambulatory Visit: Payer: Self-pay

## 2022-07-11 NOTE — Patient Outreach (Signed)
First telephone outreach attempt to obtain mRS. No answer. Left message for returned call.  Janett Kamath [facility]-Care Management Assistant [phone]  

## 2022-07-12 ENCOUNTER — Encounter: Payer: Medicare PPO | Admitting: Physical Therapy

## 2022-07-12 DIAGNOSIS — H903 Sensorineural hearing loss, bilateral: Secondary | ICD-10-CM | POA: Diagnosis not present

## 2022-07-12 DIAGNOSIS — H9312 Tinnitus, left ear: Secondary | ICD-10-CM | POA: Diagnosis not present

## 2022-07-16 ENCOUNTER — Other Ambulatory Visit: Payer: Self-pay

## 2022-07-16 ENCOUNTER — Ambulatory Visit: Payer: Medicare PPO | Admitting: Physical Therapy

## 2022-07-16 NOTE — Patient Outreach (Signed)
Second telephone outreach attempt to obtain mRS. No answer. Left message for returned call.  Ginny Loomer [facility]-Care Management Assistant [phone]  

## 2022-07-18 ENCOUNTER — Other Ambulatory Visit: Payer: Self-pay

## 2022-07-18 NOTE — Patient Outreach (Signed)
3 outreach attempts were completed to obtain mRs. mRs could not be obtained because patient never returned my calls. mRs=7    Sidda Humm Care Management Assistant [phone]  

## 2022-07-19 ENCOUNTER — Ambulatory Visit: Payer: Medicare PPO | Admitting: Physical Therapy

## 2022-07-24 DIAGNOSIS — C712 Malignant neoplasm of temporal lobe: Secondary | ICD-10-CM | POA: Diagnosis not present

## 2022-07-25 DIAGNOSIS — C712 Malignant neoplasm of temporal lobe: Secondary | ICD-10-CM | POA: Diagnosis not present

## 2022-07-26 ENCOUNTER — Other Ambulatory Visit: Payer: Self-pay

## 2022-07-26 ENCOUNTER — Other Ambulatory Visit: Payer: Self-pay | Admitting: *Deleted

## 2022-07-26 ENCOUNTER — Ambulatory Visit
Admission: RE | Admit: 2022-07-26 | Discharge: 2022-07-26 | Disposition: A | Discharge status: Home / Self Care | Payer: Self-pay | Source: Ambulatory Visit | Attending: Internal Medicine | Admitting: Internal Medicine

## 2022-07-26 ENCOUNTER — Inpatient Hospital Stay (HOSPITAL_BASED_OUTPATIENT_CLINIC_OR_DEPARTMENT_OTHER): Payer: Medicare PPO | Admitting: Nurse Practitioner

## 2022-07-26 ENCOUNTER — Encounter: Payer: Self-pay | Admitting: Nurse Practitioner

## 2022-07-26 ENCOUNTER — Telehealth: Payer: Self-pay | Admitting: Pharmacist

## 2022-07-26 ENCOUNTER — Inpatient Hospital Stay: Payer: Medicare PPO | Attending: Internal Medicine | Admitting: Internal Medicine

## 2022-07-26 ENCOUNTER — Other Ambulatory Visit (HOSPITAL_COMMUNITY): Payer: Self-pay

## 2022-07-26 VITALS — BP 165/75 | HR 93 | Temp 97.6°F | Resp 18 | Ht 66.0 in | Wt 148.2 lb

## 2022-07-26 DIAGNOSIS — K449 Diaphragmatic hernia without obstruction or gangrene: Secondary | ICD-10-CM | POA: Insufficient documentation

## 2022-07-26 DIAGNOSIS — C719 Malignant neoplasm of brain, unspecified: Secondary | ICD-10-CM

## 2022-07-26 DIAGNOSIS — Z800 Family history of malignant neoplasm of digestive organs: Secondary | ICD-10-CM | POA: Diagnosis not present

## 2022-07-26 DIAGNOSIS — R530 Neoplastic (malignant) related fatigue: Secondary | ICD-10-CM

## 2022-07-26 DIAGNOSIS — Z808 Family history of malignant neoplasm of other organs or systems: Secondary | ICD-10-CM | POA: Diagnosis not present

## 2022-07-26 DIAGNOSIS — Z79890 Hormone replacement therapy (postmenopausal): Secondary | ICD-10-CM | POA: Insufficient documentation

## 2022-07-26 DIAGNOSIS — Z515 Encounter for palliative care: Secondary | ICD-10-CM | POA: Diagnosis not present

## 2022-07-26 DIAGNOSIS — Z87891 Personal history of nicotine dependence: Secondary | ICD-10-CM | POA: Diagnosis not present

## 2022-07-26 DIAGNOSIS — I341 Nonrheumatic mitral (valve) prolapse: Secondary | ICD-10-CM | POA: Insufficient documentation

## 2022-07-26 DIAGNOSIS — Z8052 Family history of malignant neoplasm of bladder: Secondary | ICD-10-CM | POA: Diagnosis not present

## 2022-07-26 DIAGNOSIS — Z8042 Family history of malignant neoplasm of prostate: Secondary | ICD-10-CM | POA: Diagnosis not present

## 2022-07-26 DIAGNOSIS — C712 Malignant neoplasm of temporal lobe: Secondary | ICD-10-CM | POA: Insufficient documentation

## 2022-07-26 DIAGNOSIS — Z803 Family history of malignant neoplasm of breast: Secondary | ICD-10-CM | POA: Diagnosis not present

## 2022-07-26 DIAGNOSIS — Z7189 Other specified counseling: Secondary | ICD-10-CM | POA: Diagnosis not present

## 2022-07-26 DIAGNOSIS — E039 Hypothyroidism, unspecified: Secondary | ICD-10-CM | POA: Insufficient documentation

## 2022-07-26 DIAGNOSIS — Z79899 Other long term (current) drug therapy: Secondary | ICD-10-CM | POA: Diagnosis not present

## 2022-07-26 DIAGNOSIS — Z9221 Personal history of antineoplastic chemotherapy: Secondary | ICD-10-CM | POA: Insufficient documentation

## 2022-07-26 DIAGNOSIS — I10 Essential (primary) hypertension: Secondary | ICD-10-CM | POA: Insufficient documentation

## 2022-07-26 DIAGNOSIS — Z79630 Long term (current) use of alkylating agent: Secondary | ICD-10-CM | POA: Diagnosis not present

## 2022-07-26 DIAGNOSIS — M792 Neuralgia and neuritis, unspecified: Secondary | ICD-10-CM | POA: Diagnosis not present

## 2022-07-26 DIAGNOSIS — E785 Hyperlipidemia, unspecified: Secondary | ICD-10-CM | POA: Diagnosis not present

## 2022-07-26 DIAGNOSIS — Z8041 Family history of malignant neoplasm of ovary: Secondary | ICD-10-CM | POA: Insufficient documentation

## 2022-07-26 DIAGNOSIS — Z923 Personal history of irradiation: Secondary | ICD-10-CM | POA: Insufficient documentation

## 2022-07-26 MED ORDER — TEMOZOLOMIDE 100 MG PO CAPS
100.0000 mg | ORAL_CAPSULE | Freq: Every day | ORAL | 0 refills | Status: DC
  Filled 2022-07-26: qty 5, 28d supply, fill #0
  Filled 2022-07-26: qty 5, 5d supply, fill #0

## 2022-07-26 MED ORDER — TEMOZOLOMIDE 20 MG PO CAPS
20.0000 mg | ORAL_CAPSULE | Freq: Every day | ORAL | 0 refills | Status: DC
  Filled 2022-07-26: qty 5, 5d supply, fill #0
  Filled 2022-07-26: qty 5, 28d supply, fill #0

## 2022-07-26 MED ORDER — TEMOZOLOMIDE 20 MG PO CAPS
20.0000 mg | ORAL_CAPSULE | Freq: Every day | ORAL | 0 refills | Status: DC
  Filled 2022-07-26: qty 14, 14d supply, fill #0

## 2022-07-26 MED ORDER — PROCHLORPERAZINE MALEATE 10 MG PO TABS: 10.0000 mg | ORAL_TABLET | Freq: Four times a day (QID) | ORAL | 0 refills | Status: DC | PRN

## 2022-07-26 MED ORDER — TEMOZOLOMIDE 140 MG PO CAPS
140.0000 mg | ORAL_CAPSULE | Freq: Every day | ORAL | 0 refills | Status: DC
  Filled 2022-07-26: qty 5, 28d supply, fill #0
  Filled 2022-07-26: qty 5, 5d supply, fill #0

## 2022-07-26 MED ORDER — TEMOZOLOMIDE 100 MG PO CAPS
100.0000 mg | ORAL_CAPSULE | Freq: Every day | ORAL | 0 refills | Status: DC
  Filled 2022-07-26: qty 42, 42d supply, fill #0

## 2022-07-26 MED ORDER — LACOSAMIDE 50 MG PO TABS: 50.0000 mg | ORAL_TABLET | Freq: Two times a day (BID) | ORAL | 3 refills | Status: DC

## 2022-07-26 MED ORDER — TEMOZOLOMIDE 140 MG PO CAPS
140.0000 mg | ORAL_CAPSULE | Freq: Every day | ORAL | 0 refills | Status: DC
  Filled 2022-07-26: qty 5, 5d supply, fill #0

## 2022-07-26 NOTE — Progress Notes (Signed)
Yvonne [name] at [facility], [place] [address] [phone]   Interval Evaluation  Date of Service: 07/26/22 Patient Name: Yvonne [name] Patient MRN: [id number] Patient DOB: 09-14-1945 Provider: Ventura Sellers, MD  Identifying Statement:  Yvonne [name] is a 76 y.o. female with left temporal glioblastoma    Oncologic History: Oncology History  Glioblastoma, IDH-wildtype ([place])  04/18/2022 Surgery   Left temporal craniotomy, resection with Dr. Tommi Rumps at [facility].  Path is glioblastoma, IDHwt   05/16/2022 -  Chemotherapy   [name] : BRAIN GLIOBLASTOMA Radiation Therapy With Concurrent Temozolomide 75 mg/m2 Daily Followed By Sequential Maintenance Temozolomide x 6-12 cycles       Biomarkers:  MGMT Unknown.  IDH 1/2 Wild type.  EGFR Unknown  TERT Unknown   Interval History: Yvonne [name] presents today for follow up, now having completed radiation and Temodar and recent MRI brain.  She and her daughter describe no significant changes with regards to energy, cognition, speech.  Issues with short term memory, irritable behavior are stable.  No further seizures, no headaches.  She had visit with [facility] team yesterday.  H+P (05/03/22) Patient presented to medical attention in August 2023 with episode of sudden onset speech impairment.  Initially treated as stroke with TPA, CNS imaging later demonstrated enhancing mass in the left temporal lobe, c/w primary brain tumor.  She underwent resection at [facility] with Dr. Tommi Rumps after second opinion there.  Following surgery she experienced some speech impairment; this has improved modestly, but she still experiences difficulty with finding correct words.  Also struggling with short term memory.  She is currently living with her daughter given cognitive limitations, need for some supervision.  No further seizures with the Keppra 580m twice per day.  Currently on decadron 237mdaily  per discharge taper from [facility]   Medications: Current Outpatient Medications on File Prior to Visit  Medication Sig Dispense Refill   acetaminophen (TYLENOL) 500 MG tablet Take 1,000 mg by mouth every 8 (eight) hours as needed for moderate pain.     Cholecalciferol (VITAMIN D) 50 MCG (2000 UT) tablet Take 2,000 Units by mouth daily.      fluticasone (FLONASE) 50 MCG/ACT nasal spray Place 2 sprays into both nostrils at bedtime. (Patient not taking: Reported on 06/28/2022)     furosemide (LASIX) 20 MG tablet Take 10 mg by mouth daily. (Patient not taking: Reported on 05/03/2022)     gabapentin (NEURONTIN) 300 MG capsule Take 300 mg by mouth at bedtime.      lamoTRIgine (LAMICTAL) 25 MG tablet Take 2 tablets (50 mg total) by mouth daily for 7 days, THEN 4 tablets (100 mg total) daily for 7 days. (Patient not taking: Reported on 06/12/2022) 42 tablet 0   levETIRAcetam (KEPPRA) 500 MG tablet Take 1 tablet (500 mg total) by mouth 2 (two) times daily. 60 tablet 2   levothyroxine (SYNTHROID, LEVOTHROID) 100 MCG tablet Take 100 mcg by mouth daily before breakfast.     metoprolol succinate (TOPROL-XL) 25 MG 24 hr tablet TAKE 1/2 TABLET BY MOUTH DAILY. 45 tablet 3   metroNIDAZOLE (METROCREAM) 0.75 % cream Apply 1 application  topically daily.     ondansetron (ZOFRAN) 8 MG tablet Take 1 tablet (8 mg total) by mouth every 8 (eight) hours as needed for nausea or vomiting. May take 30-60 minutes prior to Temodar administration if nausea/vomiting occurs as needed. 30 tablet 1   rosuvastatin (CRESTOR)  5 MG tablet Take 5 mg by mouth daily.     temozolomide (TEMODAR) 100 MG capsule Take 1 capsule (100 mg total) by mouth daily. May take on an empty stomach to decrease nausea & vomiting. 42 capsule 0   temozolomide (TEMODAR) 20 MG capsule Take 1 capsule (20 mg total) by mouth daily. May take on an empty stomach to decrease nausea & vomiting. 42 capsule 0   temozolomide (TEMODAR) 5 MG capsule Take 2 capsules (10 mg  total) by mouth daily. May take on an empty stomach to decrease nausea & vomiting. 84 capsule 0   Current Facility-Administered Medications on File Prior to Visit  Medication Dose Route Frequency Provider Last Rate Last Admin   influenza vaccine adjuvanted (FLUAD) injection 0.5 mL  0.5 mL Intramuscular Once [facility] Neuroth, Acey Lav, MD        Allergies:  Allergies  Allergen Reactions   Tobramycin Hives   Sulfa Antibiotics     Stomach upset   Erythromycin Nausea Only   Past Medical History:  Past Medical History:  Diagnosis Date   Anxiety    Chest pressure    Degenerative arthritis    Deviated septum    Diabetes mellitus without complication (HCC)    diet controlled   Diabetic neuropathy ([place]) 12/03/2017   DJD (degenerative joint disease) of knee    Family history of bladder cancer    Family history of brain cancer    Family history of breast cancer    Family history of ovarian cancer    Family history of prostate cancer    Family history of stomach cancer    Family history of throat cancer    Family history of uterine cancer    Graves disease    RADIOACTIVE IODINE 1983   H/O hiatal hernia    Heart palpitations    History of diastolic dysfunction    per echo in 2011   Hyperlipidemia    Hypertension    Hypothyroidism    LVH (left ventricular hypertrophy)    per echo in 2011   Mitral valve prolapse    Neuropathy, peripheral    DR. Jannifer Franklin 12/15   Normal nuclear stress test 2011   OSA (obstructive sleep apnea)    Polyneuropathy in other diseases classified elsewhere ([place]) 05/21/2013   Rosacea 2019   MELANOMA CHEST REMOVED IN SITU   Sleep difficulties    Tinnitus    Past Surgical History:  Past Surgical History:  Procedure Laterality Date   ABDOMINAL HYSTERECTOMY  2000   TOTAL   APPENDECTOMY  1989   CARDIOVASCULAR STRESS TEST  11/09/2009   EF 75%   CARPAL TUNNEL RELEASE  2002   bilateral   COLONOSCOPY WITH PROPOFOL N/A 10/23/2018   Procedure: COLONOSCOPY WITH  PROPOFOL;  Surgeon: [name], MD;  Location: [facility] END[facility]COPY;  Service: Endoscopy;  Laterality: N/A;   ESOPHAGOGASTRODUODENOSCOPY N/A 11/16/2021   Procedure: ESOPHAGOGASTRODUODENOSCOPY (EGD);  Surgeon: [name], MD;  Location: [facility];  Service: Endoscopy;  Laterality: N/A;   ESOPHAGOGASTRODUODENOSCOPY (EGD) WITH PROPOFOL N/A 10/23/2018   Procedure: ESOPHAGOGASTRODUODENOSCOPY (EGD) WITH PROPOFOL;  Surgeon: [name], MD;  Location: [facility];  Service: Endoscopy;  Laterality: N/A;   EUS N/A 10/23/2018   Procedure: UPPER ENDOSCOPIC ULTRASOUND (EUS) RADIAL;  Surgeon: [name], MD;  Location: [facility] END[facility];  Service: Endoscopy;  Laterality: N/A;   EUS N/A 11/16/2021   Procedure: UPPER ENDOSCOPIC ULTRASOUND (EUS) RADIAL;  Surgeon: [name], MD;  Location: [facility];  Service: Endoscopy;  Laterality: N/A;   MASS EXCISION Left 06/02/2019   Procedure: EXCISION OF SUBCUTANEOUS ABDOMINAL WALL MASSES LEFT LOWER QUADRANT;  Surgeon: [name], MD;  Location: [facility];  Service: General;  Laterality: Left;   [place] ECHOCARDIOGRAM  11/01/2009   NORMAL LV FILLING AND DIASTOLIC DYSFUNCTION AND MILD AORTIC SCLEROSIS AND TRACE MITRAL REGURGITATION   Social History:  Social History   Socioeconomic History   Marital status: Married    Spouse name: Edd Fabian   Number of children: 1   Years of education: COLLEGE   Highest education level: Not on file  Occupational History   Occupation: Retired  Tobacco Use   Smoking status: Former    Packs/day: 0.50    Years: 10.00    Total pack years: 5.00    Types: Cigarettes    Quit date: 08/14/1975    Years since quitting: 46.9   Smokeless tobacco: Never  Vaping Use   Vaping Use: Never used  Substance and Sexual Activity   Alcohol use: Yes    Comment: one glass of wine a week   Drug use: No   Sexual activity: Not on file  Other Topics Concern   Not on file  Social History Narrative   Lives at  home w/ her husband, Edd Fabian.   Right-handed.   Drinks no caffeine but does eat chocolate occasionally.   epworth sleepiness scale = 3 (01/10/16)   Social Determinants of Health   Financial Resource Strain: Medium Risk (06/15/2022)   Overall Financial Resource Strain (CARDIA)    Difficulty of Paying Living Expenses: Somewhat hard  Food Insecurity: No Food Insecurity (06/15/2022)   Hunger Vital Sign    Worried About Running Out of Food in the Last Year: Never true    Ran Out of Food in the Last Year: Never true  Transportation Needs: No Transportation Needs (06/15/2022)   PRAPARE - Hydrologist (Medical): No    Lack of Transportation (Non-Medical): No  Physical Activity: Not on file  Stress: Not on file  Social Connections: Not on file  Intimate Partner Violence: Not At Risk (06/15/2022)   Humiliation, Afraid, Rape, and Kick questionnaire    Fear of Current or Ex-Partner: No    Emotionally Abused: No    Physically Abused: No    Sexually Abused: No   Family History:  Family History  Problem Relation Age of Onset   Hypertension Mother    Heart disease Mother    Dementia Mother    Heart disease Father    Arthritis Father    Diabetes Father    Hypertension Father    Pancreatic cancer Brother 62       pancreatic   Other Brother        automobile accident   Throat cancer Maternal Uncle    Brain cancer Paternal Aunt 34       malignant brain cancer   Stroke Maternal Grandmother    Heart disease Maternal Grandfather    Pancreatic cancer Cousin 73       pancreatic (maternal first cousin)   Pancreatic cancer Cousin 31       pancreatic, smoker (maternal first cousin)   Breast cancer Cousin 41       breast (maternal first cousin)   Ovarian cancer Cousin    Uterine cancer Cousin 67       uterine (paternal first cousin)   Cancer Cousin 54  unknown primary (paternal first cousin)   Bladder Cancer Cousin 25       (paternal first cousin)   Lung  cancer Cousin    Throat cancer Cousin    Prostate cancer Cousin        (maternal first cousin)   Stomach cancer Cousin 34       (maternal first cousin)   Cancer Other 9       pancreatic neuroendocrine carcinoma   Colon cancer Neg Hx    Neuropathy Neg Hx     Review of Systems: Constitutional: Doesn't report fevers, chills or abnormal weight loss Eyes: Doesn't report blurriness of vision Ears, nose, mouth, throat, and face: Doesn't report sore throat Respiratory: Doesn't report cough, dyspnea or wheezes Cardiovascular: Doesn't report palpitation, chest discomfort  Gastrointestinal:  Doesn't report nausea, constipation, diarrhea GU: Doesn't report incontinence Skin: Doesn't report skin rashes Neurological: Per HPI Musculoskeletal: Doesn't report joint pain Behavioral/Psych: Doesn't report anxiety  Physical Exam: Vitals:   07/26/22 0903  BP: (!) 165/75  Pulse: 93  Resp: 18  Temp: 97.6 F (36.4 C)  SpO2: 98%   KPS: 70. General: Alert, cooperative, pleasant, in no acute distress Head: Normal EENT: No conjunctival injection or scleral icterus.  Lungs: Resp effort normal Cardiac: Regular rate Abdomen: Non-distended abdomen Skin: No rashes cyanosis or petechiae. Extremities: No clubbing or edema  Neurologic Exam: Mental Status: Awake, alert, attentive to examiner. Oriented to self and environment. Language is fluent but comprehension is markedly impaired.  Anterograde amnesia is noted, mild neglect, elements of agnosia. Cranial Nerves: Visual acuity is grossly normal. Visual fields are full. Extra-ocular movements intact. No ptosis. Face is symmetric Motor: Tone and bulk are normal. Power is full in both arms and legs. Reflexes are symmetric, no pathologic reflexes present.  Sensory: Intact to light touch Gait: Normal.   Labs: I have reviewed the data as listed    Component Value Date/Time   NA 135 06/28/2022 1010   K 3.9 06/28/2022 1010   CL 100 06/28/2022 1010    CO2 29 06/28/2022 1010   GLUCOSE 223 (H) 06/28/2022 1010   BUN 20 06/28/2022 1010   CREATININE 0.84 06/28/2022 1010   CREATININE 0.71 01/04/2016 0739   CALCIUM 9.6 06/28/2022 1010   PROT 7.0 06/28/2022 1010   PROT 6.7 10/03/2021 0918   ALBUMIN 4.4 06/28/2022 1010   AST 11 (L) 06/28/2022 1010   ALT 15 06/28/2022 1010   ALKPHOS 50 06/28/2022 1010   BILITOT 0.5 06/28/2022 1010   GFRNONAA >60 06/28/2022 1010   GFRAA >60 10/23/2019 1004   Lab Results  Component Value Date   WBC 8.0 06/28/2022   NEUTROABS 6.4 06/28/2022   HGB 13.2 06/28/2022   HCT 38.4 06/28/2022   MCV 80.0 06/28/2022   PLT 197 06/28/2022    Assessment/Plan Glioblastoma, IDH-wildtype ([place])  Yvonne [name] is clinically stable today.  MRI brain (from [facility]) demonstrates some enhancement surrounding left temporal resection cavity, within expectations for post-radiation study.  We recommended initiating treatment with cycle #1 Temozolomide 150 mg/m2, on for five days and off for twenty three days in twenty eight day cycles. The patient will have a complete blood count performed on days 21 and 28 of each cycle, and a comprehensive metabolic panel performed on day 28 of each cycle. Labs may need to be performed more often. Zofran will prescribed for home use for nausea/vomiting.   Informed consent was obtained verbally at bedside to proceed with oral chemotherapy.  Chemotherapy should  be held for the following:  ANC less than 1,000  Platelets less than 100,000  LFT or creatinine greater than 2x ULN  If clinical concerns/contraindications develop  Due to mood effects, will transition from Keppra 571m BID to Vimpat 571mBID.  Loading dose of Vimpat 10016mID will be administered x1 day prior to cessation of Keppra.  We ask that Yvonne LISLEturn to clinic in 1 months prior to cycle #2 with labs for evaluation, or sooner as needed.  All questions were answered. The patient knows to call the clinic with any  problems, questions or concerns. No barriers to learning were detected.  The total time spent in the encounter was 30 minutes and more than 50% was on counseling and review of test results   ZacVentura SellersD Medical Director of Neuro-Oncology [facility] [place]/14/23 9:01 AM

## 2022-07-26 NOTE — Telephone Encounter (Signed)
Oral Oncology Pharmacist Encounter  Received new prescription for Temodar (temozolomide) for the maintenance treatment of glioblastoma, IDH- WT, planned duration ~6-12 months.  CMP and CBC w/ Diff from 06/28/22 assessed, no baseline dose adjustments required. Prescription dose and frequency assessed for appropriateness. Appropriate for therapy initiation.   Current medication list in Epic reviewed, no relevant/significant DDIs with Temodar identified.  Evaluated chart and no patient barriers to medication adherence noted.   Patient agreement for treatment documented in MD note on 07/26/22.  Prescription has been [facility]-scribed to the [facility] for benefits analysis and approval.  Oral Oncology Clinic will continue to follow for insurance authorization, copayment issues, initial counseling and start date.  Leron Croak, PharmD, BCPS, [facility] Hematology/Oncology Clinical Pharmacist [name] and [place] [phone] 07/26/2022 10:48 AM

## 2022-07-26 NOTE — Progress Notes (Signed)
Daughter called to advise that pharmacy has delays with Vimpat so they will take Temodar on 07/30/2022 and will plan to start Vimpat around 7 to 9 day later.    Ordered Compazine.

## 2022-07-26 NOTE — Progress Notes (Signed)
Yvonne [name]  [phone] [phone]) [phone]   Name: Yvonne [name] Date: 07/26/2022 MRN: [id number]  DOB: 06-26-1946  Patient Care Team: Kathyrn Lass, MD as PCP - General (Family Medicine) Burnell Blanks, MD as PCP - Cardiology (Cardiology) Garrel Ridgel, DPM as Consulting Physician (Podiatry) Darlin Coco, MD as Consulting Physician (Cardiology) [name], MD as Attending Physician (Gastroenterology)    REASON FOR CONSULTATION: Yvonne [name] is a 76 y.o. female with oncologic medical history including left temporal glioblastoma s/p craniotomy at [facility] (04/18/22). Currently on Temozolomide.  Palliative ask to see for symptom management and goals of care.    SOCIAL HISTORY:     reports that she quit smoking about 46 years ago. Her smoking use included cigarettes. She has a 5.00 pack-year smoking history. She has never used smokeless tobacco. She reports current alcohol use. She reports that she does not use drugs.  ADVANCE DIRECTIVES:    CODE STATUS:   PAST MEDICAL HISTORY: Past Medical History:  Diagnosis Date   Anxiety    Chest pressure    Degenerative arthritis    Deviated septum    Diabetes mellitus without complication (HCC)    diet controlled   Diabetic neuropathy ([place]) 12/03/2017   DJD (degenerative joint disease) of knee    Family history of bladder cancer    Family history of brain cancer    Family history of breast cancer    Family history of ovarian cancer    Family history of prostate cancer    Family history of stomach cancer    Family history of throat cancer    Family history of uterine cancer    Graves disease    RADIOACTIVE IODINE 1983   H/O hiatal hernia    Heart palpitations    History of diastolic dysfunction    per echo in 2011   Hyperlipidemia    Hypertension    Hypothyroidism    LVH (left ventricular hypertrophy)    per echo in 2011   Mitral valve prolapse     Neuropathy, peripheral    DR. Jannifer Franklin 12/15   Normal nuclear stress test 2011   OSA (obstructive sleep apnea)    Polyneuropathy in other diseases classified elsewhere ([place]) 05/21/2013   Rosacea 2019   MELANOMA CHEST REMOVED IN SITU   Sleep difficulties    Tinnitus     PAST SURGICAL HISTORY:  Past Surgical History:  Procedure Laterality Date   ABDOMINAL HYSTERECTOMY  2000   TOTAL   APPENDECTOMY  1989   CARDIOVASCULAR STRESS TEST  11/09/2009   EF 75%   CARPAL TUNNEL RELEASE  2002   bilateral   COLONOSCOPY WITH PROPOFOL N/A 10/23/2018   Procedure: COLONOSCOPY WITH PROPOFOL;  Surgeon: [name], MD;  Location: [facility] END[facility];  Service: Endoscopy;  Laterality: N/A;   ESOPHAGOGASTRODUODENOSCOPY N/A 11/16/2021   Procedure: ESOPHAGOGASTRODUODENOSCOPY (EGD);  Surgeon: [name], MD;  Location: [facility];  Service: Endoscopy;  Laterality: N/A;   ESOPHAGOGASTRODUODENOSCOPY (EGD) WITH PROPOFOL N/A 10/23/2018   Procedure: ESOPHAGOGASTRODUODENOSCOPY (EGD) WITH PROPOFOL;  Surgeon: [name], MD;  Location: [facility];  Service: Endoscopy;  Laterality: N/A;   EUS N/A 10/23/2018   Procedure: UPPER ENDOSCOPIC ULTRASOUND (EUS) RADIAL;  Surgeon: [name], MD;  Location: [facility];  Service: Endoscopy;  Laterality: N/A;   EUS N/A 11/16/2021   Procedure: UPPER ENDOSCOPIC ULTRASOUND (EUS) RADIAL;  Surgeon: [name], MD;  Location: [facility];  Service: Endoscopy;  Laterality: N/A;   MASS EXCISION Left 06/02/2019   Procedure: EXCISION OF SUBCUTANEOUS ABDOMINAL WALL MASSES LEFT LOWER QUADRANT;  Surgeon: [name], MD;  Location: [place];  Service: General;  Laterality: Left;   TONSILLECTOMY  1958   TRANSTHORACIC ECHOCARDIOGRAM  11/01/2009   NORMAL LV FILLING AND DIASTOLIC DYSFUNCTION AND MILD AORTIC SCLEROSIS AND TRACE MITRAL REGURGITATION    HEMATOLOGY/ONCOLOGY HISTORY:  Oncology History  Glioblastoma, IDH-wildtype ([place])  04/18/2022 Surgery   Left temporal  craniotomy, resection with Dr. Tommi Rumps at [facility].  Path is glioblastoma, IDHwt   05/16/2022 -  Chemotherapy   [name] : BRAIN GLIOBLASTOMA Radiation Therapy With Concurrent Temozolomide 75 mg/m2 Daily Followed By Sequential Maintenance Temozolomide x 6-12 cycles       ALLERGIES:  is allergic to tobramycin, sulfa antibiotics, and erythromycin.  MEDICATIONS:  Current Outpatient Medications  Medication Sig Dispense Refill   acetaminophen (TYLENOL) 500 MG tablet Take 1,000 mg by mouth every 8 (eight) hours as needed for moderate pain.     Cholecalciferol (VITAMIN D) 50 MCG (2000 UT) tablet Take 2,000 Units by mouth daily.      fluticasone (FLONASE) 50 MCG/ACT nasal spray Place 2 sprays into both nostrils at bedtime.     furosemide (LASIX) 20 MG tablet Take 10 mg by mouth daily.     gabapentin (NEURONTIN) 300 MG capsule Take 300 mg by mouth at bedtime.      lacosamide (VIMPAT) 50 MG TABS tablet Take 1 tablet (50 mg total) by mouth 2 (two) times daily. 60 tablet 3   levETIRAcetam (KEPPRA) 500 MG tablet Take 1 tablet (500 mg total) by mouth 2 (two) times daily. 60 tablet 2   levothyroxine (SYNTHROID) 88 MCG tablet Take 88 mcg by mouth daily before breakfast.     metoprolol succinate (TOPROL-XL) 25 MG 24 hr tablet TAKE 1/2 TABLET BY MOUTH DAILY. (Patient not taking: Reported on 07/26/2022) 45 tablet 3   metroNIDAZOLE (METROCREAM) 0.75 % cream Apply 1 application  topically daily.     ondansetron (ZOFRAN) 8 MG tablet Take 1 tablet (8 mg total) by mouth every 8 (eight) hours as needed for nausea or vomiting. May take 30-60 minutes prior to Temodar administration if nausea/vomiting occurs as needed. 30 tablet 1   prochlorperazine (COMPAZINE) 10 MG tablet Take 1 tablet (10 mg total) by mouth every 6 (six) hours as needed for nausea or vomiting. 30 tablet 0   rosuvastatin (CRESTOR) 5 MG tablet Take 5 mg by mouth daily.     temozolomide (TEMODAR) 100 MG capsule Take 1 capsule (100 mg  total) by mouth daily. Take with one 20 mg capsule and one 140 mg capsule (total dose 260 mg). Take for 5 days on, 23 days off. Repeat every 28 days. May take on an empty stomach to decrease nausea & vomiting. 5 capsule 0   temozolomide (TEMODAR) 140 MG capsule Take 1 capsule (140 mg total) by mouth daily. Take with one 20 mg capsule and one 100 mg capsule (total dose 260 mg). Take for 5 days on, 23 days off. Repeat every 28 days. May take on an empty stomach to decrease nausea & vomiting. 5 capsule 0   temozolomide (TEMODAR) 20 MG capsule Take 1 capsule (20 mg total) by mouth daily. Take with one 100 mg capsule and one 140 mg capsule (total dose 260 mg). Take for 5 days on, 23 days off. Repeat every 28 days. May take on an empty stomach to decrease  nausea & vomiting. 5 capsule 0   No current facility-administered medications for this visit.   Facility-Administered Medications Ordered in Other Visits  Medication Dose Route Frequency Provider Last Rate Last Admin   influenza vaccine adjuvanted (FLUAD) injection 0.5 mL  0.5 mL Intramuscular Once Vaslow, Acey Lav, MD        VITAL SIGNS: There were no vitals taken for this visit. There were no vitals filed for this visit.  Estimated body mass index is 23.92 kg/m as calculated from the following:   Height as of an earlier encounter on 07/26/22: _0  (1.676 m).   Weight as of an earlier encounter on 07/26/22: 148 lb 3.2 oz (67.2 kg).  LABS: CBC:    Component Value Date/Time   WBC 8.0 06/28/2022 1010   WBC 6.7 04/06/2022 0145   HGB 13.2 06/28/2022 1010   HCT 38.4 06/28/2022 1010   PLT 197 06/28/2022 1010   MCV 80.0 06/28/2022 1010   NEUTROABS 6.4 06/28/2022 1010   LYMPHSABS 0.6 (L) 06/28/2022 1010   MONOABS 0.7 06/28/2022 1010   EOSABS 0.3 06/28/2022 1010   BASOSABS 0.0 06/28/2022 1010   Comprehensive Metabolic Panel:    Component Value Date/Time   NA 135 06/28/2022 1010   K 3.9 06/28/2022 1010   CL 100 06/28/2022 1010   CO2 29  06/28/2022 1010   BUN 20 06/28/2022 1010   CREATININE 0.84 06/28/2022 1010   CREATININE 0.71 01/04/2016 0739   GLUCOSE 223 (H) 06/28/2022 1010   CALCIUM 9.6 06/28/2022 1010   AST 11 (L) 06/28/2022 1010   ALT 15 06/28/2022 1010   ALKPHOS 50 06/28/2022 1010   BILITOT 0.5 06/28/2022 1010   PROT 7.0 06/28/2022 1010   PROT 6.7 10/03/2021 0918   ALBUMIN 4.4 06/28/2022 1010    RADIOGRAPHIC STUDIES: No results found.  PERFORMANCE STATUS (ECOG) : 1 - Symptomatic but completely ambulatory  Review of Systems  Constitutional:  Positive for activity change and fatigue.  HENT:  Positive for tinnitus.   Neurological:        Memory deficit.   Unless otherwise noted, a complete review of systems is negative.  Physical Exam General: NAD Cardiovascular: regular rate and rhythm Pulmonary: normal breathing pattern  Extremities: no edema, no joint deformities Skin: no rashes Neurological:AAOX3, decreased hearing on left side, anterograde amnesia  IMPRESSION: This is my initial visit with Yvonne [name]. Her daughter, Yvonne [name] is present. No acute distress. Sitting in recliner. Alert and able to engage appropriately in discussions.   I introduced myself, [place] RN, and Palliative's role in collaboration with the oncology team. Concept of Palliative Care was introduced as specialized medical care for people and their families living with serious illness.  It focuses on providing relief from the symptoms and stress of a serious illness.  The goal is to improve quality of life for both the patient and the family. Values and goals of care important to patient and family were attempted to be elicited.   Yvonne [name] lives in the home with her husband of more than 40 years. Yvonne [name] is her only biological child. 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 Patient also has 2 stepchildren and 2 grandchildren. She is a retired Tourist information centre manager. Enjoyed playing tennis. Christian faith.   We discussed her current illness and what it means in  the larger context of Her on-going co-morbidities. Natural disease trajectory and expectations were discussed.  Yvonne [name] and her daughter are realistic in their understanding and expectations of her current illness.  She expressed her appreciation of successively recovering from her craniotomy.  Is clear in her expressed wishes to continue to treat the treatable allow her every opportunity to continue to thrive.  Taking things 1 day at a time.  She speaks to her strong [place] faith.  Emotional support provided.  Denies nausea, vomiting, constipation, or diarrhea.  Denies any significant pain but does endorse lower extremity peripheral neuropathy.  She has been taking gabapentin 300 mg at bedtime for several years.  Feels as though her body has adjusted to this dose and is interested in any recommended changes.  Education provided on adding additional morning dose of 300 mg.  She is also switching from Keppra to Vimpat once prescription is filled.  Advised patient to start her Vimpat and increase her gabapentin dosing on Sunday to allow time to identify if there is going to be any significant changes with medication adjustment.  Patient and daughter verbalized understanding and appreciation.  I discussed the importance of continued conversation with family and their medical providers regarding overall plan of care and treatment options, ensuring decisions are within the context of the patients values and GOCs.  PLAN: Established therapeutic relationship. Education provided on palliative's role in collaboration with their Oncology/Radiation team. Gabapentin 300 mg twice daily.  Will start on Sunday to allow initiation of Vimpat.  Extensive goals of care discussions.  Patient and family is clear and expressed wishes to continue to treat the treatable allow her every opportunity to continue to thrive.  She is appreciative of her current stability.  Ongoing goals of care discussions and support as  needed. I will plan to see patient back in 2-4 weeks in collaboration to other oncology appointments.  I will plan to contact patient in 1 week for close evaluation of dose modifications.   Patient expressed understanding and was in agreement with this plan. She also understands that She can call the clinic at any time with any questions, concerns, or complaints.   Thank you for your referral and allowing Palliative to assist in Yvonne [name] care.   Number and complexity of problems addressed: HIGH - 1 or more chronic illnesses with SEVERE exacerbation, progression, or side effects of treatment - advanced cancer, pain. Any controlled substances utilized were prescribed in the context of palliative care.  Time Total: 45 min   Visit consisted of counseling and education dealing with the complex and emotionally intense issues of symptom management and palliative care in the setting of serious and potentially life-threatening illness.Greater than 50%  of this time was spent counseling and coordinating care related to the above assessment and plan.  Signed by: Alda Lea, AGPCNP-BC Palliative Medicine Team/[place]

## 2022-07-26 NOTE — Patient Instructions (Addendum)
-   increase your gabapentin starting on SUNDAY, 07/29/2022, to 2 times a day, 1 capsule in the morning and 1 capsule in the evening for your neuropathy (start on Sunday to get 2 full days of Vimpat to ensure there are no side effects from the [place]) -call the office [phone], or MyChart message Lexine Baton ([place] [address]) with any questions or concerns

## 2022-07-26 NOTE — Telephone Encounter (Addendum)
Oral Chemotherapy Pharmacist Encounter  I spoke with patient's daughter, Yvonne [name], for overview of: Temodar (temozolomide) for the maintenance treatment of glioblastoma multiforme, planned duration ~6-12 months of treatment.  Counseled on administration, dosing, side effects, monitoring, drug-food interactions, safe handling, storage, and disposal.  Patient will take Temodar '20mg'$  capsules, Temodar 100 and Temodar '140mg'$  capsules, '260mg'$  total daily dose, by mouth once daily, may take at bedtime and on an empty stomach to decrease nausea and vomiting.  If 1st cycle is well tolerated, patient's daughter informed that Temodar dose may be increased to 200 mg/m2 daily for 5 days on, 23 days off, repeated every 28 days for subsequent cycles   Patient will take Temodar daily for 5 days on, 23 days off, and repeated.  Temodar start date: 07/31/22 PM    Adverse effects include but are not limited to: nausea, vomiting, anorexia, GI upset, rash, drug fever, and fatigue. Rare but serious adverse effects of pneumocystis pneumonia and secondary malignancy also discussed. Nausea: Patient will take Zofran '8mg'$  tablet, 1 tablet by mouth 30-60 min prior to Temodar dose to help decrease N/V.  Reviewed importance of keeping a medication schedule and plan for any missed doses. No barriers to medication adherence identified.  Medication reconciliation performed and medication/allergy list updated.  All questions answered.  Patient's daughter voiced understanding and appreciation.   Medication education handout placed in mail for patient and patient's family. Patient's daughter knows to call the office with questions or concerns. Oral Chemotherapy Clinic phone number provided.  Leron Croak, PharmD, BCPS, [facility] Hematology/Oncology Clinical Pharmacist [name] and [place] [phone] 07/26/2022 2:37 PM

## 2022-07-27 ENCOUNTER — Telehealth: Payer: Self-pay | Admitting: Internal Medicine

## 2022-07-27 ENCOUNTER — Other Ambulatory Visit: Payer: Medicare PPO

## 2022-07-27 ENCOUNTER — Inpatient Hospital Stay: Payer: Medicare PPO

## 2022-07-27 NOTE — Telephone Encounter (Signed)
Called to schedule f/u for patient, spoke with daughter. Patient will be notified.

## 2022-07-27 NOTE — Progress Notes (Signed)
[place] CSW Progress Note  Clinical Education officer, museum contacted caregiver by phone to assess psychosocial needs.  Spoke with patient's daughter, [name].  She reported patient was doing well.  She had an MRI at [facility] and will be starting medication next week.  Patient has returned home with her husband.  [name] stated she is monitoring her mother closely.  It has been an adjustment for the family.  No other needs at this time.  Provided supportive counseling.    Rodman Pickle Katelyn Broadnax, LCSW

## 2022-07-30 ENCOUNTER — Encounter: Payer: Self-pay | Admitting: Hematology

## 2022-07-30 NOTE — Progress Notes (Signed)
Rvc'd staff msg from [facility] requesting I look into this pt to see if she qualifies for the [employer].  I called the pt to to see if she was interested in applying for the grant and gave her the income requirement.  She's interested but she needs to call me back with her household income.  If she qualifies I will approve her for the grant.

## 2022-07-31 DIAGNOSIS — E1140 Type 2 diabetes mellitus with diabetic neuropathy, unspecified: Secondary | ICD-10-CM | POA: Diagnosis not present

## 2022-07-31 DIAGNOSIS — C719 Malignant neoplasm of brain, unspecified: Secondary | ICD-10-CM | POA: Diagnosis not present

## 2022-07-31 DIAGNOSIS — E785 Hyperlipidemia, unspecified: Secondary | ICD-10-CM | POA: Diagnosis not present

## 2022-07-31 DIAGNOSIS — Z6823 Body mass index (BMI) 23.0-23.9, adult: Secondary | ICD-10-CM | POA: Diagnosis not present

## 2022-07-31 DIAGNOSIS — G629 Polyneuropathy, unspecified: Secondary | ICD-10-CM | POA: Diagnosis not present

## 2022-07-31 DIAGNOSIS — I10 Essential (primary) hypertension: Secondary | ICD-10-CM | POA: Diagnosis not present

## 2022-07-31 DIAGNOSIS — E039 Hypothyroidism, unspecified: Secondary | ICD-10-CM | POA: Diagnosis not present

## 2022-08-02 ENCOUNTER — Encounter: Payer: Self-pay | Admitting: Podiatry

## 2022-08-02 ENCOUNTER — Ambulatory Visit: Payer: Medicare PPO | Admitting: Podiatry

## 2022-08-02 DIAGNOSIS — E1142 Type 2 diabetes mellitus with diabetic polyneuropathy: Secondary | ICD-10-CM

## 2022-08-02 NOTE — Progress Notes (Signed)
She presents today with her daughter she presents chief concern about her cold that he feels in her feet and her legs she states that she has tried everything from different kinds of socks to different kinds of rubs and nothing seems to work for her.  She says that they have gotten worse since her brain tumor.  Relates a glioblastoma that was resected several months ago now has taken several rounds of chemotherapy and takes states that the neuropathy seems to be getting more intense.  She really does not have pain other than the cold.  She is relates that there is no loss of balance and there is no loss of strength.  Objective: Vital signs stable she is alert and oriented x 3 pulses are palpable.  She still has palpable sensation of plantar aspect of bilateral foot muscle strength is normal symmetrical bilateral.  Cutaneous evaluation demonstrates well-hydrated cutis is no erythema edema cellulitis drainage or odor.  Assessment: Chronic intractable neuropathy bilateral lower extremity seems to be symmetric.  Plan: Discussed over-the-counter topical and oral medications.  Discussed talking with their neuro surgeon about ablation therapy.

## 2022-08-08 ENCOUNTER — Telehealth: Payer: Self-pay | Admitting: *Deleted

## 2022-08-08 NOTE — Telephone Encounter (Signed)
Received call from pt's daughter, Judyann Munson.  She is requesting clarification of the transition from [place] to [place]. Reviewed instructions per Dr. Renda Rolls note. Explained that pt needs loading dose of VimPat prior to stopping Keppra and starting regular daily dose of Vimpat:  It will be Vimpat 100 mg 2 x a day for day as well as Keppra. Then next day stop Keppra and then regular daily VimPat will be 50 mg 2 x a day. [name] voiced understanding. She also states that her mother completed the 1st 5 days of Temodar without any issues. No further questions or concerns.

## 2022-08-10 ENCOUNTER — Ambulatory Visit: Payer: Medicare PPO

## 2022-08-10 ENCOUNTER — Telehealth: Payer: Self-pay | Admitting: *Deleted

## 2022-08-10 DIAGNOSIS — L089 Local infection of the skin and subcutaneous tissue, unspecified: Secondary | ICD-10-CM | POA: Diagnosis not present

## 2022-08-10 NOTE — Telephone Encounter (Signed)
Received call back from pt's daughter. She states that the area on the side of her mother's head is the size of a nickel, reddish in color with a yellowish head on it. She is concerned that left untreated, a more serious type of infection may occur.  Please advise.

## 2022-08-10 NOTE — Telephone Encounter (Signed)
Received vm message from pt's daughter regarding a "spot" near the scar near her craniotomy scar.  Attempted call back to daughter. No answer but was able to leave call back # for her to return call [phone]

## 2022-08-14 ENCOUNTER — Other Ambulatory Visit (HOSPITAL_COMMUNITY): Payer: Self-pay

## 2022-08-14 ENCOUNTER — Ambulatory Visit
Admission: RE | Admit: 2022-08-14 | Discharge: 2022-08-14 | Disposition: A | Discharge status: Home / Self Care | Payer: Medicare PPO | Source: Ambulatory Visit | Attending: Internal Medicine | Admitting: Internal Medicine

## 2022-08-14 NOTE — Progress Notes (Signed)
  Radiation Oncology         [phone] ________________________________  Name: Yvonne [name] MRN: [id number]  Date of Service: 08/14/2022  DOB: [birth year]  Post Treatment Telephone Note  Diagnosis:  Glioblastoma of the left temporal lobe   Intent: Curative  Radiation Treatment Dates: 05/21/2022 through 06/29/2022 Site Technique Total Dose (Gy) Dose per Fx (Gy) Completed Fx Beam Energies  Brain: Brain IMRT 46/46 2 23/23 6X  Brain: Brain_Bst IMRT 14/14 2 7/7 6X  (as documented in provider EOT note)   The patient was available for call today.  The patient did note fatigue during radiation but has since improved. The patient did not note hair loss or skin changes in the field of radiation during therapy. The patient is not taking dexamethasone. The patient does not have symptoms of  weakness or loss of control of the extremities. The patient does not have symptoms of headache. The patient does not have symptoms of seizure or uncontrolled movement. The patient does not have symptoms of changes in vision. The patient does not have changes in speech. The patient does not have confusion.   The patient was counseled that she will be contacted by our brain and spine navigator to schedule surveillance imaging. The patient was encouraged to call if  she have not received a call to schedule imaging, or if she develop concerns or questions regarding radiation. The patient will also continue to follow up with Dr. Mickeal Skinner in medical oncology.  This concludes the interview.   Leandra Kern, LPN

## 2022-08-17 ENCOUNTER — Other Ambulatory Visit (HOSPITAL_COMMUNITY): Payer: Self-pay

## 2022-08-17 ENCOUNTER — Other Ambulatory Visit: Payer: Self-pay | Admitting: Internal Medicine

## 2022-08-17 DIAGNOSIS — C712 Malignant neoplasm of temporal lobe: Secondary | ICD-10-CM | POA: Insufficient documentation

## 2022-08-17 DIAGNOSIS — C719 Malignant neoplasm of brain, unspecified: Secondary | ICD-10-CM

## 2022-08-17 MED ORDER — TEMOZOLOMIDE 20 MG PO CAPS
20.0000 mg | ORAL_CAPSULE | Freq: Every day | ORAL | 0 refills | Status: DC
  Filled 2022-08-17: qty 5, 5d supply, fill #0
  Filled 2022-08-20: qty 5, 28d supply, fill #0

## 2022-08-17 MED ORDER — TEMOZOLOMIDE 140 MG PO CAPS
140.0000 mg | ORAL_CAPSULE | Freq: Every day | ORAL | 0 refills | Status: DC
  Filled 2022-08-17 – 2022-08-20 (×2): qty 5, 28d supply, fill #0

## 2022-08-17 MED ORDER — TEMOZOLOMIDE 100 MG PO CAPS
100.0000 mg | ORAL_CAPSULE | Freq: Every day | ORAL | 0 refills | Status: DC
  Filled 2022-08-17: qty 5, 28d supply, fill #0

## 2022-08-20 ENCOUNTER — Other Ambulatory Visit: Payer: Self-pay

## 2022-08-20 ENCOUNTER — Other Ambulatory Visit (HOSPITAL_COMMUNITY): Payer: Self-pay

## 2022-08-20 ENCOUNTER — Ambulatory Visit: Payer: Medicare PPO | Attending: Nurse Practitioner

## 2022-08-20 DIAGNOSIS — R41841 Cognitive communication deficit: Secondary | ICD-10-CM | POA: Insufficient documentation

## 2022-08-20 DIAGNOSIS — R4701 Aphasia: Secondary | ICD-10-CM | POA: Insufficient documentation

## 2022-08-20 NOTE — Therapy (Signed)
OUTPATIENT SPEECH LANGUAGE PATHOLOGY EVALUATION   Patient Name: Yvonne [name] MRN: [id number] DOB:[birth year], 77 y.o., female Today's Date: 08/20/2022  PCP: Yvonne Lass, MD REFERRING PROVIDER: Jimmy Footman, NP  END OF SESSION:  End of Session - 08/20/22 1103     Visit Number 1    Number of Visits 25    Date for SLP Re-Evaluation 11/16/22    SLP Start Time 0805    SLP Stop Time  0850    SLP Time Calculation (min) 45 min    Activity Tolerance Patient tolerated treatment well             Past Medical History:  Diagnosis Date   Anxiety    Chest pressure    Degenerative arthritis    Deviated septum    Diabetes mellitus without complication ([place])    diet controlled   Diabetic neuropathy ([place]) 12/03/2017   DJD (degenerative joint disease) of knee    Family history of bladder cancer    Family history of brain cancer    Family history of breast cancer    Family history of ovarian cancer    Family history of prostate cancer    Family history of stomach cancer    Family history of throat cancer    Family history of uterine cancer    Graves disease    RADIOACTIVE IODINE 1983   H/O hiatal hernia    Heart palpitations    History of diastolic dysfunction    per echo in 2011   Hyperlipidemia    Hypertension    Hypothyroidism    LVH (left ventricular hypertrophy)    per echo in 2011   Mitral valve prolapse    Neuropathy, peripheral    DR. Jannifer [name] 12/15   Normal nuclear stress test 2011   OSA (obstructive sleep apnea)    Polyneuropathy in other diseases classified elsewhere ([place]) 05/21/2013   Rosacea 2019   MELANOMA CHEST REMOVED IN SITU   Sleep difficulties    Tinnitus    Past Surgical History:  Procedure Laterality Date   ABDOMINAL HYSTERECTOMY  2000   TOTAL   APPENDECTOMY  1989   CARDIOVASCULAR STRESS TEST  11/09/2009   EF 75%   CARPAL TUNNEL RELEASE  2002   bilateral   COLONOSCOPY WITH PROPOFOL N/A 10/23/2018   Procedure: COLONOSCOPY WITH  PROPOFOL;  Surgeon: Yvonne Banister, MD;  Location: [facility];  Service: Endoscopy;  Laterality: N/A;   ESOPHAGOGASTRODUODENOSCOPY N/A 11/16/2021   Procedure: ESOPHAGOGASTRODUODENOSCOPY (EGD);  Surgeon: Yvonne Banister, MD;  Location: [facility];  Service: Endoscopy;  Laterality: N/A;   ESOPHAGOGASTRODUODENOSCOPY (EGD) WITH PROPOFOL N/A 10/23/2018   Procedure: ESOPHAGOGASTRODUODENOSCOPY (EGD) WITH PROPOFOL;  Surgeon: Yvonne Banister, MD;  Location: [facility];  Service: Endoscopy;  Laterality: N/A;   EUS N/A 10/23/2018   Procedure: UPPER ENDOSCOPIC ULTRASOUND (EUS) RADIAL;  Surgeon: Yvonne Banister, MD;  Location: [facility];  Service: Endoscopy;  Laterality: N/A;   EUS N/A 11/16/2021   Procedure: UPPER ENDOSCOPIC ULTRASOUND (EUS) RADIAL;  Surgeon: Yvonne Banister, MD;  Location: [facility];  Service: Endoscopy;  Laterality: N/A;   MASS EXCISION Left 06/02/2019   Procedure: EXCISION OF SUBCUTANEOUS ABDOMINAL WALL MASSES LEFT LOWER QUADRANT;  Surgeon: Yvonne Mesa, MD;  Location: [facility];  Service: General;  Laterality: Left;   TONSILLECTOMY  1958   TRANSTHORACIC ECHOCARDIOGRAM  11/01/2009   NORMAL LV FILLING AND DIASTOLIC DYSFUNCTION AND MILD AORTIC SCLEROSIS AND TRACE MITRAL REGURGITATION   Patient Active Problem List  Diagnosis Date Noted   Glioblastoma of temporal lobe ([place]) 08/17/2022   Chronic serous otitis media of left ear 06/26/2022   Eustachian tube dysfunction, left 06/26/2022   Glioblastoma, IDH-wildtype ([place]) 05/03/2022   Brain lesion 04/10/2022   Acute ischemic stroke ([place]) 04/04/2022   Osteoarthritis of left knee 11/24/2021   Osteoarthritis of right knee 11/24/2021   Excessive cerumen in ear canal, right 05/29/2021   Sensorineural hearing loss (SNHL), bilateral 05/29/2021   Family history of brain cancer    Family history of throat cancer    Family history of breast cancer    Family history of ovarian cancer    Family history of prostate cancer    Family  history of stomach cancer    Family history of bladder cancer    Family history of uterine cancer    Anxiety 10/27/2020   Daytime somnolence 10/27/2020   Diabetic peripheral neuropathy associated with type 2 diabetes mellitus ([place]) 10/27/2020   Excessive sleepiness 10/27/2020   Hypertension 10/27/2020   Obstructive sleep apnea syndrome 10/27/2020   Peripheral neuropathy 10/27/2020   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 10/27/2020   Rosacea 10/27/2020   Vitamin D deficiency 10/27/2020   Deviated septum 05/09/2020   Trochanteric bursitis of left hip 12/30/2019   Special screening for malignant neoplasms, colon    Diverticulosis of colon without hemorrhage    Family history of pancreatic cancer    Pancreatic cyst    Osteoarthritis of knee 12/04/2017   Diabetic neuropathy ([place]) 12/03/2017   Newly diagnosed diabetes ([place]) 06/20/2017   Bilateral hearing loss 05/28/2017   Bilateral impacted cerumen 05/28/2017   Seasonal allergic rhinitis 05/28/2017   Tinnitus, bilateral 05/28/2017   Genetic testing 10/15/2016   DM2 (diabetes mellitus, type 2) ([place]) 01/10/2016   Polyneuropathy in other diseases classified elsewhere ([place]) 05/21/2013   History of hyperglycemia 04/01/2012   Atypical chest pain 12/28/2011   Benign hypertensive heart disease without heart failure 02/05/2011   Hyperlipidemia 02/05/2011   Hypothyroidism 02/05/2011   Palpitations 02/05/2011    ONSET DATE: script 07/26/22   REFERRING DIAG: C71.9 (ICD-10-CM) - Glioblastoma, IDH-wildtype ([place])  THERAPY DIAG:  Aphasia  Cognitive communication deficit  Rationale for Evaluation and Treatment: Rehabilitation  SUBJECTIVE:   SUBJECTIVE STATEMENT: "I need this - this one here." (Re: daughter Yvonne [name])   "He has- has, an unbelievable thing that people talk about their highness." Pt accompanied by: family member daughter, Yvonne [name] HISTORY:  04/18/2022: Surgery-Left temporal craniotomy, resection with  Dr. Tommi [name] at [facility]. Path is glioblastoma. 05/16/2022- Radiation Therapy With Concurrent Temozolomide 75 mg/m2 Daily Followed By Sequential Maintenance Temozolomide x 6-12 cycles. Currently having memory issues and referred by [name], NP. Pt with expressive aphasia since sx in September.  PAIN:  Are you having pain? No  FALLS: Has patient fallen in last 6 months?  No  LIVING ENVIRONMENT: Lives with: lives alone Lives in: House/apartment  PLOF:  Level of assistance: Independent with ADLs, Independent with IADLs Employment: Retired  PATIENT GOALS: Pt indicated she wants to improve her speaking.  OBJECTIVE:   DIAGNOSTIC FINDINGS:  1. 3 cm region of masslike signal abnormality in the left temporal lobe with small foci of enhancement, most concerning for primary CNS neoplasm. Demyelinating disease, vasculitis, and infection are less likely considerations. 2. No acute infarct.  AUDITORY COMPREHENSION: Overall auditory comprehension: Impaired: moderately complex YES/NO questions: Impaired: moderately complex Following directions: Impaired: moderately complex Conversation: Moderately Complex Interfering components: attention and processing speed - cognitive  linguistics may require further assessment in first 1-3 sessions Effective technique: extra processing time, repetition/stressing words, slowed speech, stressing words, and visual/gestural cues. SLP told daughter to simplify language with pt, and to speak at a slower rate to enhance pt's comprehension.  READING COMPREHENSION: Further testing necessary. Pt read sentences 95-100% success however comprehension was not assessed  EXPRESSION: verbal and nonverbal- gestures  VERBAL EXPRESSION: Level of generative/spontaneous verbalization: word, phrase, sentence, and conversation Automatic speech: name: intact, social response: intact, and counting: intact  Repetition: Impaired: sentence with 8-word sentence Naming:  Confrontation: 0-25% 1/6 Pragmatics: Impaired: topic appropriateness and decr'd error awareness (expressive and receptive) made conversation uncomfortable at times with this trained listener. Interfering components: attention (possible - may require cognitive communication evaluation during this therapy course) Effective technique: phonemic cues, written cues, and semantic cues appeared not to work as well as mod phonemic cues; Written cues are expected to work well, as pt's reading was WNL Non-verbal means of communication: gestures, rarely  WRITTEN EXPRESSION: Dominant hand: right Written expression: Not tested  MOTOR SPEECH:  Overall motor speech: Appears intact  COGNITION: Overall cognitive status: Difficulty to assess due to: Communication impairment Areas of impairment:  Awareness: Impaired: Intellectual and Error awareness: minimal % Executive function: Impaired: Error awareness and Self-correction - may be related to receptive aphasia Functional deficits: Pt has difficulty communicating her message when she does not self-correct at appropriate times.  COGNITIVE COMMUNICATION: Following directions: Follows multi-step commands inconsistently or with incr'd time and repeats Auditory comprehension: Impaired: likely due to receptive aphasia. See "auditory comprehension" above. Verbal expression: Impaired: see "expressive communication" above.    ORAL MOTOR EXAMINATION: Overall status: WFL   STANDARDIZED ASSESSMENTS: QAB: Moderate Aphasia QAB Summary      Word comprehension 6.67  Sentence comprehension 3.33  Word finding 3.00  Grammatical construction 7.50  Speech motor programming 10.00  Repetition 8.33  Reading 10.00  QAB overall ======================================== SCORING (QAB Overall) 0.00-4.99= severe 5.00-7.49= moderate 7.50-8.89= mild 8.90-10.0= no aphasia 6.22      PATIENT REPORTED OUTCOME MEASURES (PROM): Communication Effectiveness Survey: to be  completed in first few therapy sessions. Score may be depressed due to decr'd awareness of linguistic errors. SLP to have pt and daughter fill out separate forms.   TODAY'S TREATMENT:                                                                                                                                         DATE:  08/20/22: SLP reviewed results of evaluation and explained the rationale for x2/week ST. Pt appeared reticent about frequency and duration of ST. SLP provided pt homework and explained to daughter Suanne Marker in restroom) that she should observe pt's written responses and if pt is not providing correct answers someone will need to complete WITH the patient.  Due to pt's reduced awareness of errors, SLP may need to record pt and use this  in a subsequent session to incr pt's awareness of her error rate/severity.  PATIENT EDUCATION: Education details: see "today's treatment" Person educated: Patient and Child(ren) Education method: Explanation, Demonstration, and Handouts Education comprehension: verbalized understanding, verbal cues required, and needs further education   GOALS: Goals reviewed with patient? Yes, generally  SHORT TERM GOALS: Target date: 09/28/22  Pt will functionally name 5 items in a common category within one minute using compensations PRN, in three sessions  Baseline: Goal status: INITIAL  2.  Pt will demo 70% error awareness in 5 minutes simple conversation, given min nonverbal cue in 3 sessions Baseline:  Goal status: INITIAL  3.  Pt will produce functional sentence responses 90% success given 2 attempts, in 3 sessions Baseline:  Goal status: INITIAL  4.  Pt will functionally describe 3 expressive language compensations in 2 sessions  Baseline:  Goal status: Initial  5.  Pt will demo understanding of 5 minutes simple conversation with two repeats requested by pt, in 3 sessions  Baseline:  Goal status: Initial  LONG TERM GOALS: Target date:  11/16/22  Pt will functionally participate in 10 minutes simple conversation in 3 sessions  Baseline:  Goal status: INITIAL  2. Pt will demo 80% error awareness in 5 minutes simple-mod complex conversation, given min nonverbal cue in 3 sessions Baseline:  Goal status: INITIAL  3.  Pt will functionally participate in 10 minutes simple-mod complex conversation with compensations PRN, in 3 sessions  Baseline:  Goal status: INITIAL  4.  Pt will demo 80% error awareness in 10 minutes simple-mod complex conversation, given min nonverbal cue in 3 sessions Baseline:  Goal status: INITIAL  5.  Pt will demo understanding of 10 minutes simple-mod complex conversation in 3 sessions  Baseline:  Goal status: Initial  6.  Pt will generate higher/better score on PROM in last 1-2 sessions of ST, compared to initial score Baseline:  Goal status: INITIAL  ASSESSMENT:  CLINICAL IMPRESSION: Yvonne [name] is a 77 y.o. female who was seen today for assessment of aphasia in light of tumor resection and is now post chemo/rad tx with ongoing regular chemotherapy. She exhibited expressive language deficits of anomia, and errors described as semantic paraphasias of which she was not always aware and complicated/hindered communication flow. Example of this was, "More and more affair about everything to have discussions." Pt's daughter Yvonne [name] confirmed these things occur at home as well. Currently pt is managing meds, cooking, cleaning, and managing her home with cameras placed for Yvonne [name] and family to ensure her safety. Pt with questionable cognitive deficits which are challenging to unequivocally confirm at this point due to her expressive and receptive aphasia. Assessment for cognitive-linguistics may be necessary later.   OBJECTIVE IMPAIRMENTS: include attention, awareness, expressive language, receptive language, and aphasia. These impairments are limiting patient from household responsibilities, ADLs/IADLs, and  effectively communicating at home and in community. Factors affecting potential to achieve goals and functional outcome are severity of impairments.. Patient will benefit from skilled SLP services to address above impairments and improve overall function.  REHAB POTENTIAL: Good  PLAN:  SLP FREQUENCY: 2x/week  SLP DURATION: 12 weeks  PLANNED INTERVENTIONS: Language facilitation, Cognitive reorganization, Internal/external aids, Functional tasks, Multimodal communication approach, SLP instruction and feedback, Compensatory strategies, and Patient/family education    [facility], [place] 08/20/2022, 11:04 AM

## 2022-08-23 ENCOUNTER — Other Ambulatory Visit (HOSPITAL_COMMUNITY): Payer: Self-pay

## 2022-08-23 ENCOUNTER — Ambulatory Visit: Payer: Medicare PPO

## 2022-08-23 DIAGNOSIS — R4701 Aphasia: Secondary | ICD-10-CM

## 2022-08-23 DIAGNOSIS — R41841 Cognitive communication deficit: Secondary | ICD-10-CM
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
## 2022-08-27 ENCOUNTER — Other Ambulatory Visit (HOSPITAL_COMMUNITY): Payer: Self-pay

## 2022-08-27 ENCOUNTER — Telehealth: Payer: Self-pay | Admitting: Internal Medicine

## 2022-08-27 DIAGNOSIS — H52203 Unspecified astigmatism, bilateral: Secondary | ICD-10-CM | POA: Diagnosis not present

## 2022-08-27 DIAGNOSIS — H2513 Age-related nuclear cataract, bilateral: Secondary | ICD-10-CM | POA: Diagnosis not present

## 2022-08-27 DIAGNOSIS — H40013 Open angle with borderline findings, low risk, bilateral: Secondary | ICD-10-CM | POA: Diagnosis not present

## 2022-08-27 NOTE — Telephone Encounter (Signed)
RN requested appointments be r/s to 1/16 with all providers. R/s and left patient voicemail with new appointment details.

## 2022-08-28 ENCOUNTER — Other Ambulatory Visit: Payer: Self-pay | Admitting: Pharmacist

## 2022-08-28 ENCOUNTER — Other Ambulatory Visit: Payer: Self-pay

## 2022-08-28 ENCOUNTER — Inpatient Hospital Stay: Payer: Medicare PPO | Attending: Internal Medicine

## 2022-08-28 ENCOUNTER — Inpatient Hospital Stay: Payer: Medicare PPO | Admitting: Nurse Practitioner

## 2022-08-28 ENCOUNTER — Other Ambulatory Visit (HOSPITAL_COMMUNITY): Payer: Self-pay

## 2022-08-28 ENCOUNTER — Inpatient Hospital Stay (HOSPITAL_BASED_OUTPATIENT_CLINIC_OR_DEPARTMENT_OTHER): Payer: Medicare PPO | Admitting: Internal Medicine

## 2022-08-28 VITALS — BP 145/69 | HR 73 | Temp 98.1°F | Resp 18 | Wt 149.5 lb

## 2022-08-28 DIAGNOSIS — Z8052 Family history of malignant neoplasm of bladder: Secondary | ICD-10-CM | POA: Insufficient documentation

## 2022-08-28 DIAGNOSIS — C712 Malignant neoplasm of temporal lobe: Secondary | ICD-10-CM | POA: Insufficient documentation

## 2022-08-28 DIAGNOSIS — Z803 Family history of malignant neoplasm of breast: Secondary | ICD-10-CM | POA: Diagnosis not present

## 2022-08-28 DIAGNOSIS — C719 Malignant neoplasm of brain, unspecified: Secondary | ICD-10-CM | POA: Diagnosis not present

## 2022-08-28 DIAGNOSIS — Z87891 Personal history of nicotine dependence: Secondary | ICD-10-CM | POA: Diagnosis not present

## 2022-08-28 DIAGNOSIS — Z85820 Personal history of malignant melanoma of skin: Secondary | ICD-10-CM | POA: Insufficient documentation

## 2022-08-28 DIAGNOSIS — Z8041 Family history of malignant neoplasm of ovary: Secondary | ICD-10-CM | POA: Diagnosis not present

## 2022-08-28 DIAGNOSIS — Z8042 Family history of malignant neoplasm of prostate: Secondary | ICD-10-CM | POA: Insufficient documentation

## 2022-08-28 DIAGNOSIS — Z79899 Other long term (current) drug therapy: Secondary | ICD-10-CM | POA: Diagnosis not present

## 2022-08-28 DIAGNOSIS — I341 Nonrheumatic mitral (valve) prolapse: Secondary | ICD-10-CM | POA: Insufficient documentation

## 2022-08-28 DIAGNOSIS — Z9221 Personal history of antineoplastic chemotherapy: Secondary | ICD-10-CM | POA: Diagnosis not present

## 2022-08-28 DIAGNOSIS — E1140 Type 2 diabetes mellitus with diabetic neuropathy, unspecified: Secondary | ICD-10-CM | POA: Insufficient documentation

## 2022-08-28 DIAGNOSIS — Z79630 Long term (current) use of alkylating agent: Secondary | ICD-10-CM | POA: Diagnosis not present

## 2022-08-28 DIAGNOSIS — Z800 Family history of malignant neoplasm of digestive organs: Secondary | ICD-10-CM | POA: Diagnosis not present

## 2022-08-28 DIAGNOSIS — Z79890 Hormone replacement therapy (postmenopausal): Secondary | ICD-10-CM | POA: Diagnosis not present

## 2022-08-28 DIAGNOSIS — G4730 Sleep apnea, unspecified: Secondary | ICD-10-CM | POA: Insufficient documentation

## 2022-08-28 LAB — CBC WITH DIFFERENTIAL (CANCER CENTER ONLY)
Abs Immature Granulocytes: 0.03 10*3/uL (ref 0.00–0.07)
Basophils Absolute: 0.1 10*3/uL (ref 0.0–0.1)
Basophils Relative: 1 %
Eosinophils Absolute: 0.4 10*3/uL (ref 0.0–0.5)
Eosinophils Relative: 4 %
HCT: 40 % (ref 36.0–46.0)
Hemoglobin: 13.6 g/dL (ref 12.0–15.0)
Immature Granulocytes: 0 %
Lymphocytes Relative: 13 %
Lymphs Abs: 1.1 10*3/uL (ref 0.7–4.0)
MCH: 26.8 pg (ref 26.0–34.0)
MCHC: 34 g/dL (ref 30.0–36.0)
MCV: 78.9 fL — ABNORMAL LOW (ref 80.0–100.0)
Monocytes Absolute: 0.8 10*3/uL (ref 0.1–1.0)
Monocytes Relative: 9 %
Neutro Abs: 6.6 10*3/uL (ref 1.7–7.7)
Neutrophils Relative %: 73 %
Platelet Count: 195 10*3/uL (ref 150–400)
RBC: 5.07 MIL/uL (ref 3.87–5.11)
RDW: 14.8 % (ref 11.5–15.5)
WBC Count: 9 10*3/uL (ref 4.0–10.5)
nRBC: 0 % (ref 0.0–0.2)

## 2022-08-28 LAB — CMP (CANCER CENTER ONLY)
ALT: 20 U/L (ref 0–44)
AST: 14 U/L — ABNORMAL LOW (ref 15–41)
Albumin: 4.1 g/dL (ref 3.5–5.0)
Alkaline Phosphatase: 52 U/L (ref 38–126)
Anion gap: 8 (ref 5–15)
BUN: 20 mg/dL (ref 8–23)
CO2: 27 mmol/L (ref 22–32)
Calcium: 9.7 mg/dL (ref 8.9–10.3)
Chloride: 99 mmol/L (ref 98–111)
Creatinine: 0.66 mg/dL (ref 0.44–1.00)
GFR, Estimated: >60 mL/min (ref >60)
Glucose, Bld: 241 mg/dL — ABNORMAL HIGH (ref 70–99)
Potassium: 4.1 mmol/L (ref 3.5–5.1)
Sodium: 134 mmol/L — ABNORMAL LOW (ref 135–145)
Total Bilirubin: 0.4 mg/dL (ref 0.3–1.2)
Total Protein: 6.6 g/dL (ref 6.5–8.1)

## 2022-08-28 MED ORDER — TEMOZOLOMIDE 100 MG PO CAPS
200.0000 mg | ORAL_CAPSULE | Freq: Every day | ORAL | 0 refills | Status: DC
  Filled 2022-08-28: qty 14, 7d supply, fill #0
  Filled 2022-08-28: qty 10, 5d supply, fill #0

## 2022-08-28 MED ORDER — TEMOZOLOMIDE 140 MG PO CAPS
140.0000 mg | ORAL_CAPSULE | Freq: Every day | ORAL | 0 refills | Status: DC
  Filled 2022-08-28 (×2): qty 5, 5d supply, fill #0

## 2022-08-28 NOTE — Progress Notes (Signed)
[facility] [place], [place] [address] [phone]   Interval Evaluation  Date of Service: 08/28/22 Patient Name: Yvonne [name] Patient MRN: [id number] Patient DOB: 02/13/1946 Provider: Ventura Sellers, MD  Identifying Statement:  Yvonne [name] is a 77 y.o. female with left temporal glioblastoma    Oncologic History: Oncology History  Glioblastoma, IDH-wildtype ([place])  04/18/2022 Surgery   Left temporal craniotomy, resection with Dr. Tommi Rumps at [facility].  Path is glioblastoma, IDHwt   05/16/2022 -  Chemotherapy   [name] : BRAIN GLIOBLASTOMA Radiation Therapy With Concurrent Temozolomide 75 mg/m2 Daily Followed By Sequential Maintenance Temozolomide x 6-12 cycles       Biomarkers:  MGMT Unknown.  IDH 1/2 Wild type.  EGFR Unknown  TERT Unknown   Interval History: Yvonne [name] presents today for follow up, now having completed cycle #1 of adjuvant Temodar.  Tolerated treatment well overall.  She and her daughter describe no significant changes with regards to energy, cognition, speech.  Issues with short term memory, irritable behavior are stable.  No further seizures, no headaches.    H+P (05/03/22) Patient presented to medical attention in August 2023 with episode of sudden onset speech impairment.  Initially treated as stroke with TPA, CNS imaging later demonstrated enhancing mass in the left temporal lobe, c/w primary brain tumor.  She underwent resection at [facility] with Dr. Tommi Rumps after second opinion there.  Following surgery she experienced some speech impairment; this has improved modestly, but she still experiences difficulty with finding correct words.  Also struggling with short term memory.  She is currently living with her daughter given cognitive limitations, need for some supervision.  No further seizures with the Keppra '500mg'$  twice per day.  Currently on decadron '2mg'$  daily [facility]    Medications: Current Outpatient Medications on File Prior to Visit  Medication Sig Dispense Refill   acetaminophen (TYLENOL) 500 MG tablet Take 1,000 mg by mouth every 8 (eight) hours as needed for moderate pain.     Cholecalciferol (VITAMIN D) 50 MCG (2000 UT) tablet Take 2,000 Units by mouth daily.      fluticasone (FLONASE) 50 MCG/ACT nasal spray Place 2 sprays into both nostrils at bedtime.     furosemide (LASIX) 20 MG tablet Take 10 mg by mouth daily.     gabapentin (NEURONTIN) 300 MG capsule Take 300 mg by mouth at bedtime.      lacosamide (VIMPAT) 50 MG TABS tablet Take 1 tablet (50 mg total) by mouth 2 (two) times daily. 60 tablet 3   levETIRAcetam (KEPPRA) 500 MG tablet Take 1 tablet (500 mg total) by mouth 2 (two) times daily. 60 tablet 2   levothyroxine (SYNTHROID) 88 MCG tablet Take 88 mcg by mouth daily before breakfast.     metoprolol succinate (TOPROL-XL) 25 MG 24 hr tablet TAKE 1/2 TABLET BY MOUTH DAILY. (Patient not taking: Reported on 07/26/2022) 45 tablet 3   metroNIDAZOLE (METROCREAM) 0.75 % cream Apply 1 application  topically daily.     ondansetron (ZOFRAN) 8 MG tablet Take 1 tablet (8 mg total) by mouth every 8 (eight) hours as needed for nausea or vomiting. May take 30-60 minutes prior to Temodar administration if nausea/vomiting occurs as needed. 30 tablet 1   prochlorperazine (COMPAZINE) 10 MG tablet Take 1 tablet (10 mg total) by mouth every 6 (six) hours as needed for nausea or vomiting. 30 tablet 0  rosuvastatin (CRESTOR) 5 MG tablet Take 5 mg by mouth daily.     temozolomide (TEMODAR) 100 MG capsule Take 1 capsule (100 mg total) by mouth daily. Take with one 20 mg capsule and one 140 mg capsule (total dose 260 mg). Take for 5 days on, 23 days off. Repeat every 28 days. May take on an empty stomach to decrease nausea & vomiting. 5 capsule 0   temozolomide (TEMODAR) 140 MG capsule Take 1 capsule (140 mg total) by mouth daily. Take with one 20 mg  capsule and one 100 mg capsule (total dose 260 mg). Take for 5 days on, 23 days off. Repeat every 28 days. May take on an empty stomach to decrease nausea & vomiting. 5 capsule 0   temozolomide (TEMODAR) 20 MG capsule Take 1 capsule (20 mg total) by mouth daily. Take with one 100 mg capsule and one 140 mg capsule (total dose 260 mg). Take for 5 days on, 23 days off. Repeat every 28 days. May take on an empty stomach to decrease nausea & vomiting. 5 capsule 0   Current Facility-Administered Medications on File Prior to Visit  Medication Dose Route Frequency Provider Last Rate Last Admin   influenza vaccine adjuvanted (FLUAD) injection 0.5 mL  0.5 mL Intramuscular Once Mirella Gueye, Acey Lav, MD        Allergies:  Allergies  Allergen Reactions   Tobramycin Hives   Sulfa Antibiotics     Stomach upset   Erythromycin Nausea Only   Past Medical History:  Past Medical History:  Diagnosis Date   Anxiety    Chest pressure    Degenerative arthritis    Deviated septum    Diabetes mellitus without complication (HCC)    diet controlled   Diabetic neuropathy ([place]) 12/03/2017   DJD (degenerative joint disease) of knee    Family history of bladder cancer    Family history of brain cancer    Family history of breast cancer    Family history of ovarian cancer    Family history of prostate cancer    Family history of stomach cancer    Family history of throat cancer    Family history of uterine cancer    Graves disease    RADIOACTIVE IODINE 1983   H/O hiatal hernia    Heart palpitations    History of diastolic dysfunction    per echo in 2011   Hyperlipidemia    Hypertension    Hypothyroidism    LVH (left ventricular hypertrophy)    per echo in 2011   Mitral valve prolapse    Neuropathy, peripheral    DR. Jannifer Franklin 12/15   Normal nuclear stress test 2011   OSA (obstructive sleep apnea)    Polyneuropathy in other diseases classified elsewhere ([place]) 05/21/2013   Rosacea 2019   MELANOMA CHEST  REMOVED IN SITU   Sleep difficulties    Tinnitus    Past Surgical History:  Past Surgical History:  Procedure Laterality Date   ABDOMINAL HYSTERECTOMY  2000   TOTAL   APPENDECTOMY  1989   CARDIOVASCULAR STRESS TEST  11/09/2009   EF 75%   CARPAL TUNNEL RELEASE  2002   bilateral   COLONOSCOPY WITH PROPOFOL N/A 10/23/2018   Procedure: COLONOSCOPY WITH PROPOFOL;  Surgeon: [name], MD;  Location: [facility];  Service: Endoscopy;  Laterality: N/A;   ESOPHAGOGASTRODUODENOSCOPY N/A 11/16/2021   Procedure: ESOPHAGOGASTRODUODENOSCOPY (EGD);  Surgeon: [name], MD;  Location: [facility];  Service: Endoscopy;  Laterality: N/A;  ESOPHAGOGASTRODUODENOSCOPY (EGD) WITH PROPOFOL N/A 10/23/2018   Procedure: ESOPHAGOGASTRODUODENOSCOPY (EGD) WITH PROPOFOL;  Surgeon: [name], MD;  Location: [facility];  Service: Endoscopy;  Laterality: N/A;   EUS N/A 10/23/2018   Procedure: UPPER ENDOSCOPIC ULTRASOUND (EUS) RADIAL;  Surgeon: [name], MD;  Location: [facility];  Service: Endoscopy;  Laterality: N/A;   EUS N/A 11/16/2021   Procedure: UPPER ENDOSCOPIC ULTRASOUND (EUS) RADIAL;  Surgeon: [name], MD;  Location: [facility];  Service: Endoscopy;  Laterality: N/A;   MASS EXCISION Left 06/02/2019   Procedure: EXCISION OF SUBCUTANEOUS ABDOMINAL WALL MASSES LEFT LOWER QUADRANT;  Surgeon: [name], MD;  Location: [facility];  Service: General;  Laterality: Left;   [place] ECHOCARDIOGRAM  11/01/2009   NORMAL LV FILLING AND DIASTOLIC DYSFUNCTION AND MILD AORTIC SCLEROSIS AND TRACE MITRAL REGURGITATION   Social History:  Social History   Socioeconomic History   Marital status: Married    Spouse name: Edd Fabian   Number of children: 1   Years of education: COLLEGE   Highest education level: Not on file  Occupational History   Occupation: Retired  Tobacco Use   Smoking status: Former    Packs/day: 0.50    Years: 10.00    Total pack years:  5.00    Types: Cigarettes    Quit date: 08/14/1975    Years since quitting: 47.0   Smokeless tobacco: Never  Vaping Use   Vaping Use: Never used  Substance and Sexual Activity   Alcohol use: Yes    Comment: one glass of wine a week   Drug use: No   Sexual activity: Not on file  Other Topics Concern   Not on file  Social History Narrative   Lives at home w/ her husband, Edd Fabian.   Right-handed.   Drinks no caffeine but does eat chocolate occasionally.   epworth sleepiness scale = 3 (01/10/16)   Social Determinants of Health   Financial Resource Strain: Medium Risk (06/15/2022)   Overall Financial Resource Strain (CARDIA)    Difficulty of Paying Living Expenses: Somewhat hard  Food Insecurity: No Food Insecurity (06/15/2022)   Hunger Vital Sign    Worried About Running Out of Food in the Last Year: Never true    Ran Out of Food in the Last Year: Never true  Transportation Needs: No Transportation Needs (06/15/2022)   PRAPARE - Hydrologist (Medical): No    Lack of Transportation (Non-Medical): No  Physical Activity: Not on file  Stress: Not on file  Social Connections: Not on file  Intimate Partner Violence: Not At Risk (06/15/2022)   Humiliation, Afraid, Rape, and Kick questionnaire    Fear of Current or Ex-Partner: No    Emotionally Abused: No    Physically Abused: No    Sexually Abused: No   Family History:  Family History  Problem Relation Age of Onset   Hypertension Mother    Heart disease Mother    Dementia Mother    Heart disease Father    Arthritis Father    Diabetes Father    Hypertension Father    Pancreatic cancer Brother 81       pancreatic   Other Brother        automobile accident   Throat cancer Maternal Uncle    Brain cancer Paternal Aunt 104       malignant brain cancer   Stroke Maternal Grandmother    Heart disease Maternal Grandfather  Pancreatic cancer Cousin 40       pancreatic (maternal first cousin)   Pancreatic  cancer Cousin 59       pancreatic, smoker (maternal first cousin)   Breast cancer Cousin 47       breast (maternal first cousin)   Ovarian cancer Cousin    Uterine cancer Cousin 67       uterine (paternal first cousin)   Cancer Cousin 65       unknown primary (paternal first cousin)   Bladder Cancer Cousin 70       (paternal first cousin)   Lung cancer Cousin    Throat cancer Cousin    Prostate cancer Cousin        (maternal first cousin)   Stomach cancer Cousin 39       (maternal first cousin)   Cancer Other 12       pancreatic neuroendocrine carcinoma   Colon cancer Neg Hx    Neuropathy Neg Hx     Review of Systems: Constitutional: Doesn't report fevers, chills or abnormal weight loss Eyes: Doesn't report blurriness of vision Ears, nose, mouth, throat, and face: Doesn't report sore throat Respiratory: Doesn't report cough, dyspnea or wheezes Cardiovascular: Doesn't report palpitation, chest discomfort  Gastrointestinal:  Doesn't report nausea, constipation, diarrhea GU: Doesn't report incontinence Skin: Doesn't report skin rashes Neurological: Per HPI Musculoskeletal: Doesn't report joint pain Behavioral/Psych: Doesn't report anxiety  Physical Exam: Vitals:   08/28/22 1351  BP: (!) 145/69  Pulse: 73  Resp: 18  Temp: 98.1 F (36.7 C)  SpO2: 99%   KPS: 70. General: Alert, cooperative, pleasant, in no acute distress Head: Normal EENT: No conjunctival injection or scleral icterus.  Lungs: Resp effort normal Cardiac: Regular rate Abdomen: Non-distended abdomen Skin: No rashes cyanosis or petechiae. Extremities: No clubbing or edema  Neurologic Exam: Mental Status: Awake, alert, attentive to examiner. Oriented to self and environment. Language is fluent but comprehension is markedly impaired.  Anterograde amnesia is noted, mild neglect, elements of agnosia. Cranial Nerves: Visual acuity is grossly normal. Visual fields are full. Extra-ocular movements intact. No  ptosis. Face is symmetric Motor: Tone and bulk are normal. Power is full in both arms and legs. Reflexes are symmetric, no pathologic reflexes present.  Sensory: Intact to light touch Gait: Normal.   Labs: I have reviewed the data as listed    Component Value Date/Time   NA 135 06/28/2022 1010   K 3.9 06/28/2022 1010   CL 100 06/28/2022 1010   CO2 29 06/28/2022 1010   GLUCOSE 223 (H) 06/28/2022 1010   BUN 20 06/28/2022 1010   CREATININE 0.84 06/28/2022 1010   CREATININE 0.71 01/04/2016 0739   CALCIUM 9.6 06/28/2022 1010   PROT 7.0 06/28/2022 1010   PROT 6.7 10/03/2021 0918   ALBUMIN 4.4 06/28/2022 1010   AST 11 (L) 06/28/2022 1010   ALT 15 06/28/2022 1010   ALKPHOS 50 06/28/2022 1010   BILITOT 0.5 06/28/2022 1010   GFRNONAA >60 06/28/2022 1010   GFRAA >60 10/23/2019 1004   Lab Results  Component Value Date   WBC 9.0 08/28/2022   NEUTROABS 6.6 08/28/2022   HGB 13.6 08/28/2022   HCT 40.0 08/28/2022   MCV 78.9 (L) 08/28/2022   PLT 195 08/28/2022    Assessment/Plan Glioblastoma of temporal lobe ([place])  Yvonne [name] is clinically stable today.  Labs are within normal limits.   We recommended initiating treatment with cycle #2 Temozolomide 200 mg/m2, on for five days and  off for twenty three days in twenty eight day cycles. The patient will have a complete blood count performed on days 21 and 28 of each cycle, and a comprehensive metabolic panel performed on day 28 of each cycle. Labs may need to be performed more often. Zofran will prescribed for home use for nausea/vomiting.   Informed consent was obtained verbally at bedside to proceed with oral chemotherapy.  Chemotherapy should be held for the following:  ANC less than 1,000  Platelets less than 100,000  LFT or creatinine greater than 2x ULN  If clinical concerns/contraindications develop  Will con't Vimpat '50mg'$  BID, mood improved off the Keppra.  We ask that AMA MCMASTER return to clinic in 1 months  prior to cycle #3 with MRI brain for evaluation, or sooner as needed.  All questions were answered. The patient knows to call the clinic with any problems, questions or concerns. No barriers to learning were detected.  The total time spent in the encounter was 30 minutes and more than 50% was on counseling and review of test results   Ventura Sellers, MD Medical Director of Neuro-Oncology [facility] [place] 08/28/22 2:13 PM

## 2022-08-28 NOTE — Progress Notes (Signed)
Oral Chemotherapy Pharmacist Encounter   Temozolomide Rx that was sent in on 08/17/22 has been discontinued from the pharmacy as patient will be increasing dose of temozolomide for cycle #2 to total daily dose of 340 mg (two 100 mg capsules + one 140 mg capsule) for 5 days on, 23 days off. Repeated every 28 days.   Leron Croak, PharmD, BCPS, BCOP Hematology/Oncology Clinical Pharmacist [name] [phone] 08/28/2022 3:00 PM

## 2022-08-28 NOTE — Progress Notes (Deleted)
[facility]  [phone] [phone]   Name: Yvonne [name] Date: 08/28/2022 MRN: [id number]  DOB: [birth year]  Patient Care Team: Kathyrn Lass, MD as PCP - General (Family Medicine) Burnell Blanks, MD as PCP - Cardiology (Cardiology) Garrel Ridgel, DPM as Consulting Physician (Podiatry) Darlin Coco, MD as Consulting Physician (Cardiology) [name], MD as Attending Physician (Gastroenterology)    INTERVAL HISTORY: Yvonne [name] is a 77 y.o. female with oncologic medical history including left temporal glioblastoma s/p craniotomy at [facility] (04/18/22). Currently on Temozolomide.  Palliative ask to see for symptom management and goals of care.   SOCIAL HISTORY:     reports that she quit smoking about 47 years ago. Her smoking use included cigarettes. She has a 5.00 pack-year smoking history. She has never used smokeless tobacco. She reports current alcohol use. She reports that she does not use drugs.  ADVANCE DIRECTIVES:    CODE STATUS: Full code  PAST MEDICAL HISTORY: Past Medical History:  Diagnosis Date   Anxiety    Chest pressure    Degenerative arthritis    Deviated septum    Diabetes mellitus without complication ([place])    diet controlled   Diabetic neuropathy ([place]) 12/03/2017   DJD (degenerative joint disease) of knee    Family history of bladder cancer    Family history of brain cancer    Family history of breast cancer    Family history of ovarian cancer    Family history of prostate cancer    Family history of stomach cancer    Family history of throat cancer    Family history of uterine cancer    Graves disease    RADIOACTIVE IODINE 1983   H/O hiatal hernia    Heart palpitations    History of diastolic dysfunction    per echo in 2011   Hyperlipidemia    Hypertension    Hypothyroidism    LVH (left ventricular hypertrophy)    per echo in 2011   Mitral valve prolapse     Neuropathy, peripheral    DR. Jannifer Franklin 12/15   Normal nuclear stress test 2011   OSA (obstructive sleep apnea)    Polyneuropathy in other diseases classified elsewhere ([place]) 05/21/2013   Rosacea 2019   MELANOMA CHEST REMOVED IN SITU   Sleep difficulties    Tinnitus     ALLERGIES:  is allergic to tobramycin, sulfa antibiotics, and erythromycin.  MEDICATIONS:  Current Outpatient Medications  Medication Sig Dispense Refill   acetaminophen (TYLENOL) 500 MG tablet Take 1,000 mg by mouth every 8 (eight) hours as needed for moderate pain.     Cholecalciferol (VITAMIN D) 50 MCG (2000 UT) tablet Take 2,000 Units by mouth daily.      fluticasone (FLONASE) 50 MCG/ACT nasal spray Place 2 sprays into both nostrils at bedtime.     furosemide (LASIX) 20 MG tablet Take 10 mg by mouth daily.     gabapentin (NEURONTIN) 300 MG capsule Take 300 mg by mouth at bedtime.      lacosamide (VIMPAT) 50 MG TABS tablet Take 1 tablet (50 mg total) by mouth 2 (two) times daily. 60 tablet 3   levETIRAcetam (KEPPRA) 500 MG tablet Take 1 tablet (500 mg total) by mouth 2 (two) times daily. 60 tablet 2   levothyroxine (SYNTHROID) 88 MCG tablet Take 88 mcg by mouth daily before breakfast.     metoprolol succinate (TOPROL-XL) 25 MG 24 hr tablet TAKE  1/2 TABLET BY MOUTH DAILY. (Patient not taking: Reported on 07/26/2022) 45 tablet 3   metroNIDAZOLE (METROCREAM) 0.75 % cream Apply 1 application  topically daily.     ondansetron (ZOFRAN) 8 MG tablet Take 1 tablet (8 mg total) by mouth every 8 (eight) hours as needed for nausea or vomiting. May take 30-60 minutes prior to Temodar administration if nausea/vomiting occurs as needed. 30 tablet 1   prochlorperazine (COMPAZINE) 10 MG tablet Take 1 tablet (10 mg total) by mouth every 6 (six) hours as needed for nausea or vomiting. 30 tablet 0   rosuvastatin (CRESTOR) 5 MG tablet Take 5 mg by mouth daily.     temozolomide (TEMODAR) 100 MG capsule Take 1 capsule (100 mg total) by mouth  daily. Take with one 20 mg capsule and one 140 mg capsule (total dose 260 mg). Take for 5 days on, 23 days off. Repeat every 28 days. May take on an empty stomach to decrease nausea & vomiting. 5 capsule 0   temozolomide (TEMODAR) 140 MG capsule Take 1 capsule (140 mg total) by mouth daily. Take with one 20 mg capsule and one 100 mg capsule (total dose 260 mg). Take for 5 days on, 23 days off. Repeat every 28 days. May take on an empty stomach to decrease nausea & vomiting. 5 capsule 0   temozolomide (TEMODAR) 20 MG capsule Take 1 capsule (20 mg total) by mouth daily. Take with one 100 mg capsule and one 140 mg capsule (total dose 260 mg). Take for 5 days on, 23 days off. Repeat every 28 days. May take on an empty stomach to decrease nausea & vomiting. 5 capsule 0   No current facility-administered medications for this visit.   Facility-Administered Medications Ordered in Other Visits  Medication Dose Route Frequency Provider Last Rate Last Admin   influenza vaccine adjuvanted (FLUAD) injection 0.5 mL  0.5 mL Intramuscular Once Vaslow, Acey Lav, MD        VITAL SIGNS: There were no vitals taken for this visit. There were no vitals filed for this visit.  Estimated body mass index is 23.92 kg/m as calculated from the following:   Height as of 07/26/22: 5' 6"$  (1.676 m).   Weight as of 07/26/22: 148 lb 3.2 oz (67.2 kg).   PERFORMANCE STATUS (ECOG) : 1 - Symptomatic but completely ambulatory   Physical Exam General: NAD Cardiovascular: regular rate and rhythm Pulmonary: clear ant fields Abdomen: soft, nontender, + bowel sounds GU: no suprapubic tenderness Extremities: no edema, no joint deformities Skin: no rashes Neurological:   IMPRESSION: ***  We discussed Her current illness and what it means in the larger context of Her on-going co-morbidities. Natural disease trajectory and expectations were discussed.  I discussed the importance of continued conversation with family and their  medical providers regarding overall plan of care and treatment options, ensuring decisions are within the context of the patients values and GOCs.  PLAN: Established therapeutic relationship. Education provided on palliative's role in collaboration with their Oncology/Radiation team. Gabapentin 300 mg twice daily.  Will start on Sunday to allow initiation of Vimpat.  Extensive goals of care discussions.  Patient and family is clear and expressed wishes to continue to treat the treatable allow her every opportunity to continue to thrive.  She is appreciative of her current stability.  Ongoing goals of care discussions and support as needed. I will plan to see patient back in 2-4 weeks in collaboration to other oncology appointments.  I will plan to  contact patient in 1 week for close evaluation of dose modifications.   Patient expressed understanding and was in agreement with this plan. She also understands that She can call the clinic at any time with any questions, concerns, or complaints.   Any controlled substances utilized were prescribed in the context of palliative care. PDMP has been reviewed.   Time Total: ***  Visit consisted of counseling and education dealing with the complex and emotionally intense issues of symptom management and palliative care in the setting of serious and potentially life-threatening illness.Greater than 50%  of this time was spent counseling and coordinating care related to the above assessment and plan.  Alda Lea, AGPCNP-BC  Palliative Medicine Team/[place]

## 2022-08-29 ENCOUNTER — Ambulatory Visit: Payer: Medicare PPO

## 2022-08-29 ENCOUNTER — Telehealth: Payer: Self-pay | Admitting: Internal Medicine

## 2022-08-29 DIAGNOSIS — R4701 Aphasia: Secondary | ICD-10-CM

## 2022-08-29 DIAGNOSIS — R41841 Cognitive communication deficit: Secondary | ICD-10-CM

## 2022-08-29 LAB — CANCER ANTIGEN 19-9: CA 19-9: 9 U/mL (ref 0–35)

## 2022-08-29 NOTE — Therapy (Signed)
OUTPATIENT SPEECH LANGUAGE PATHOLOGY TREATMENT   Patient Name: Yvonne [name] MRN: [id number] DOB:1946-02-29, 77 y.o., female Today's Date: 08/29/2022  PCP: Kathyrn Lass, MD REFERRING PROVIDER: Jimmy Footman, NP  END OF SESSION:  End of Session - 08/29/22 2256     Visit Number 3    Number of Visits 25    Date for SLP Re-Evaluation 11/16/22    SLP Start Time 1452    SLP Stop Time  1531    SLP Time Calculation (min) 39 min    Activity Tolerance Patient tolerated treatment well;Other (comment)   limited by severity of deficit             Past Medical History:  Diagnosis Date   Anxiety    Chest pressure    Degenerative arthritis    Deviated septum    Diabetes mellitus without complication (HCC)    diet controlled   Diabetic neuropathy ([place]) 12/03/2017   DJD (degenerative joint disease) of knee    Family history of bladder cancer    Family history of brain cancer    Family history of breast cancer    Family history of ovarian cancer    Family history of prostate cancer    Family history of stomach cancer    Family history of throat cancer    Family history of uterine cancer    Graves disease    RADIOACTIVE IODINE 1983   H/O hiatal hernia    Heart palpitations    History of diastolic dysfunction    per echo in 2011   Hyperlipidemia    Hypertension    Hypothyroidism    LVH (left ventricular hypertrophy)    per echo in 2011   Mitral valve prolapse    Neuropathy, peripheral    DR. Jannifer Franklin 12/15   Normal nuclear stress test 2011   OSA (obstructive sleep apnea)    Polyneuropathy in other diseases classified elsewhere ([place]) 05/21/2013   Rosacea 2019   MELANOMA CHEST REMOVED IN SITU   Sleep difficulties    Tinnitus    Past Surgical History:  Procedure Laterality Date   ABDOMINAL HYSTERECTOMY  2000   TOTAL   APPENDECTOMY  1989   CARDIOVASCULAR STRESS TEST  11/09/2009   EF 75%   CARPAL TUNNEL RELEASE  2002   bilateral   COLONOSCOPY WITH  PROPOFOL N/A 10/23/2018   Procedure: COLONOSCOPY WITH PROPOFOL;  Surgeon: [name], MD;  Location: [facility];  Service: Endoscopy;  Laterality: N/A;   ESOPHAGOGASTRODUODENOSCOPY N/A 11/16/2021   Procedure: ESOPHAGOGASTRODUODENOSCOPY (EGD);  Surgeon: [name], MD;  Location: [facility];  Service: Endoscopy;  Laterality: N/A;   ESOPHAGOGASTRODUODENOSCOPY (EGD) WITH PROPOFOL N/A 10/23/2018   Procedure: ESOPHAGOGASTRODUODENOSCOPY (EGD) WITH PROPOFOL;  Surgeon: [name], MD;  Location: [facility];  Service: Endoscopy;  Laterality: N/A;   EUS N/A 10/23/2018   Procedure: UPPER ENDOSCOPIC ULTRASOUND (EUS) RADIAL;  Surgeon: [name], MD;  Location: [facility];  Service: Endoscopy;  Laterality: N/A;   EUS N/A 11/16/2021   Procedure: UPPER ENDOSCOPIC ULTRASOUND (EUS) RADIAL;  Surgeon: [name], MD;  Location: [facility];  Service: Endoscopy;  Laterality: N/A;   MASS EXCISION Left 06/02/2019   Procedure: EXCISION OF SUBCUTANEOUS ABDOMINAL WALL MASSES LEFT LOWER QUADRANT;  Surgeon: [name], MD;  Location: [facility];  Service: General;  Laterality: Left;   TONSILLECTOMY  1958   TRANSTHORACIC ECHOCARDIOGRAM  11/01/2009   NORMAL LV FILLING AND DIASTOLIC DYSFUNCTION AND MILD AORTIC SCLEROSIS AND TRACE  MITRAL REGURGITATION   Patient Active Problem List   Diagnosis Date Noted   Glioblastoma of temporal lobe ([place]) 08/17/2022   Chronic serous otitis media of left ear 06/26/2022   Eustachian tube dysfunction, left 06/26/2022   Glioblastoma, IDH-wildtype ([place]) 05/03/2022   Brain lesion 04/10/2022   Acute ischemic stroke ([place]) 04/04/2022   Osteoarthritis of left knee 11/24/2021   Osteoarthritis of right knee 11/24/2021   Excessive cerumen in ear canal, right 05/29/2021   Sensorineural hearing loss (SNHL), bilateral 05/29/2021   Family history of brain cancer    Family history of throat cancer    Family history of breast cancer    Family history of ovarian cancer     Family history of prostate cancer    Family history of stomach cancer    Family history of bladder cancer    Family history of uterine cancer    Anxiety 10/27/2020   Daytime somnolence 10/27/2020   Diabetic peripheral neuropathy associated with type 2 diabetes mellitus ([place]) 10/27/2020   Excessive sleepiness 10/27/2020   Hypertension 10/27/2020   Obstructive sleep apnea syndrome 10/27/2020   Peripheral neuropathy 10/27/2020   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 10/27/2020   Rosacea 10/27/2020   Vitamin D deficiency 10/27/2020   Deviated septum 05/09/2020   Trochanteric bursitis of left hip 12/30/2019   Special screening for malignant neoplasms, colon    Diverticulosis of colon without hemorrhage    Family history of pancreatic cancer    Pancreatic cyst    Osteoarthritis of knee 12/04/2017   Diabetic neuropathy ([place]) 12/03/2017   Newly diagnosed diabetes ([place]) 06/20/2017   Bilateral hearing loss 05/28/2017   Bilateral impacted cerumen 05/28/2017   Seasonal allergic rhinitis 05/28/2017   Tinnitus, bilateral 05/28/2017   Genetic testing 10/15/2016   DM2 (diabetes mellitus, type 2) ([place]) 01/10/2016   Polyneuropathy in other diseases classified elsewhere ([place]) 05/21/2013   History of hyperglycemia 04/01/2012   Atypical chest pain 12/28/2011   Benign hypertensive heart disease without heart failure 02/05/2011   Hyperlipidemia 02/05/2011   Hypothyroidism 02/05/2011   Palpitations 02/05/2011    ONSET DATE: script 07/26/22   REFERRING DIAG: C71.9 (ICD-10-CM) - Glioblastoma, IDH-wildtype ([place])  THERAPY DIAG:  Aphasia  Cognitive communication deficit  Rationale for Evaluation and Treatment: Rehabilitation  SUBJECTIVE:   SUBJECTIVE STATEMENT: "It wasn't my best thing." (Pt with first of 5 doses chemo last night)  Pt accompanied by: significant other Yvonne [name]  PERTINENT HISTORY:  04/18/2022: Surgery-Left temporal craniotomy, resection with Dr. Tommi Rumps at  [facility]. Path is glioblastoma. 05/16/2022- Radiation Therapy With Concurrent Temozolomide 75 mg/m2 Daily Followed By Sequential Maintenance Temozolomide x 6-12 cycles. Currently having memory issues and referred by [name], NP. Pt with expressive aphasia since sx in September.  PAIN:  Are you having pain? No   PATIENT GOALS: Pt indicated she wants to improve her speaking.  OBJECTIVE:   DIAGNOSTIC FINDINGS:  1. 3 cm region of masslike signal abnormality in the left temporal lobe with small foci of enhancement, most concerning for primary CNS neoplasm. Demyelinating disease, vasculitis, and infection are less likely considerations. 2. No acute infarct.  STANDARDIZED ASSESSMENTS: QAB: Moderate Aphasia QAB Summary      Word comprehension 6.67  Sentence comprehension 3.33  Word finding 3.00  Grammatical construction 7.50  Speech motor programming 10.00  Repetition 8.33  Reading 10.00  QAB overall ======================================== SCORING (QAB Overall) 0.00-4.99= severe 5.00-7.49= moderate 7.50-8.89= mild 8.90-10.0= no aphasia 6.22      PATIENT REPORTED OUTCOME  MEASURES (PROM): Communication Effectiveness Survey: to be completed in first few therapy sessions. Score may be depressed due to decr'd awareness of linguistic errors. SLP to have pt and daughter fill out separate forms.   TODAY'S TREATMENT:                                                                                                                                         DATE:  08/29/22: SLP worked with pt on responsive naming with simple cues. Consistent max/total cues necessary. Phonemic cues were not helpful whatsoever. Written letter cues were most helpful and used as last resort for cueing. 4/11 times pt said the correct response but did not realize she had said the correct response.  SLP suggested blanks be written for pt if she could not generate the target word, however large blanks  ("write in the answer" type blanks) were written and not a blank for each letter. SLP demonstrated for husband type and level of cues for him to assist pt at home.   08/22/22: SLP assisted pt with homework blanks. Max cues necessary - phonemic, semantic, and visual cues were not helpful. Letter-fill-ins were necessary for pt to have any success at simple naming given a clue. SLP believes pt knew the object described due to her attempts (67% successful) at description strategy. In 2/6 words, pt stated the correct answer but did not react like she had said the correct word. SLP told daughter to use letter fill ins if pt was unable to generate the word on future homework. Pt highly motivated, but SLP concerned pt's anoimic deficits are very dense.  08/20/22: SLP reviewed results of evaluation and explained the rationale for x2/week ST. Pt appeared reticent about frequency and duration of ST. SLP provided pt homework and explained to daughter Suanne Marker in restroom) that she should observe pt's written responses and if pt is not providing correct answers someone will need to complete WITH the patient.  Due to pt's reduced awareness of errors, SLP may need to record pt and use this in a subsequent session to incr pt's awareness of her error rate/severity.  PATIENT EDUCATION: Education details: see "today's treatment" Person educated: Patient and Child(ren) Education method: Explanation, Demonstration, and Handouts Education comprehension: verbalized understanding, verbal cues required, and needs further education   GOALS: Goals reviewed with patient? Yes, generally  SHORT TERM GOALS: Target date: 09/28/22  Pt will functionally name 3 items in a common category within one minute using compensations PRN, in three sessions  Baseline: Goal status: Revised (downgraded)  2.  Pt will demo 70% error awareness in 5 minutes simple conversation, given min nonverbal cue in 3 sessions Baseline:  Goal status:  Ongoing  3.  Pt will produce functional sentence responses 70% success given 2 attempts, in 3 sessions Baseline:  Goal status: Revised (downgraded)  4.  Pt will functionally describe 3 expressive language compensations in  2 sessions  Baseline:  Goal status: Ongoing  5.  Pt will demo understanding of 5 minutes simple conversation with two repeats requested by pt, in 3 sessions  Baseline:  Goal status: Ongoing  LONG TERM GOALS: Target date: 11/16/22  Pt will functionally participate in 10 minutes simple conversation in 3 sessions  Baseline:  Goal status: Ongoing  2. Pt will demo 80% error awareness in 5 minutes simple-mod complex conversation, given min nonverbal cue in 3 sessions Baseline:  Goal status: Ongoing  3.  Pt will functionally participate in 10 minutes simple-mod complex conversation with compensations PRN, in 3 sessions  Baseline:  Goal status: Ongoing  4.  Pt will demo 80% error awareness in 10 minutes simple-mod complex conversation, given min nonverbal cue in 3 sessions Baseline:  Goal status: Ongoing  5.  Pt will demo understanding of 10 minutes simple-mod complex conversation in 3 sessions  Baseline:  Goal status: Ongoing  6.  Pt will generate higher/better score on PROM in last 1-2 sessions of ST, compared to initial score Baseline:  Goal status: Ongoing  ASSESSMENT:  CLINICAL IMPRESSION: Marin is a 77 y.o. female who was seen today for treatment of aphasia in light of tumor resection and is now post chemo/rad tx with ongoing regular chemotherapy, on her second day of Temodar of five. She exhibited expressive language deficits of anomia, and errors described as semantic paraphasias of which she was not always aware and complicated/hindered communication flow. SEE TX NOTE. Pt with persistent and dense anomic deficits in conversation and in language tasks today. Currently pt is managing meds, cooking, cleaning, and managing her home with cameras placed for  [name] and family to ensure her safety. Pt with questionable cognitive deficits which are challenging to unequivocally confirm at this point due to her expressive and receptive aphasia. Assessment for cognitive-linguistics may be necessary later.   OBJECTIVE IMPAIRMENTS: include attention, awareness, expressive language, receptive language, and aphasia. These impairments are limiting patient from household responsibilities, ADLs/IADLs, and effectively communicating at home and in community. Factors affecting potential to achieve goals and functional outcome are severity of impairments.. Patient will benefit from skilled SLP services to address above impairments and improve overall function.  REHAB POTENTIAL: Good  PLAN:  SLP FREQUENCY: 2x/week  SLP DURATION: 12 weeks  PLANNED INTERVENTIONS: Language facilitation, Cognitive reorganization, Internal/external aids, Functional tasks, Multimodal communication approach, SLP instruction and feedback, Compensatory strategies, and Patient/family education    [facility], [place] 08/29/2022, 10:57 PM

## 2022-08-29 NOTE — Telephone Encounter (Signed)
Called patient to schedule f/u. Spoke with patient's daughter. Patient will be notified of new appointment.

## 2022-08-30 ENCOUNTER — Inpatient Hospital Stay: Payer: Medicare PPO

## 2022-08-30 ENCOUNTER — Other Ambulatory Visit (HOSPITAL_COMMUNITY): Payer: Self-pay

## 2022-08-30 ENCOUNTER — Inpatient Hospital Stay: Payer: Medicare PPO | Admitting: Internal Medicine

## 2022-08-30 ENCOUNTER — Inpatient Hospital Stay: Payer: Medicare PPO | Admitting: Nurse Practitioner

## 2022-09-02 ENCOUNTER — Other Ambulatory Visit: Payer: Self-pay | Admitting: Hematology

## 2022-09-03 ENCOUNTER — Ambulatory Visit: Payer: Medicare PPO

## 2022-09-05 ENCOUNTER — Ambulatory Visit: Payer: Medicare PPO

## 2022-09-10 ENCOUNTER — Ambulatory Visit: Payer: Medicare PPO

## 2022-09-10 DIAGNOSIS — R4701 Aphasia: Secondary | ICD-10-CM

## 2022-09-10 DIAGNOSIS — R41841 Cognitive communication deficit: Secondary | ICD-10-CM | POA: Diagnosis not present

## 2022-09-10 NOTE — Therapy (Signed)
OUTPATIENT SPEECH LANGUAGE PATHOLOGY TREATMENT   Patient Name: Yvonne [name] MRN: [id number] DOB:Mar 01, 1946, 77 y.o., female Today's Date: 09/10/2022  PCP: Kathyrn Lass, MD REFERRING PROVIDER: Jimmy Footman, NP  END OF SESSION:  End of Session - 09/10/22 0935     Visit Number 4    Number of Visits 25    Date for SLP Re-Evaluation 11/16/22    SLP Start Time 0849    SLP Stop Time  0931    SLP Time Calculation (min) 42 min    Activity Tolerance Patient tolerated treatment well               Past Medical History:  Diagnosis Date   Anxiety    Chest pressure    Degenerative arthritis    Deviated septum    Diabetes mellitus without complication ([place])    diet controlled   Diabetic neuropathy ([place]) 12/03/2017   DJD (degenerative joint disease) of knee    Family history of bladder cancer    Family history of brain cancer    Family history of breast cancer    Family history of ovarian cancer    Family history of prostate cancer    Family history of stomach cancer    Family history of throat cancer    Family history of uterine cancer    Graves disease    RADIOACTIVE IODINE 1983   H/O hiatal hernia    Heart palpitations    History of diastolic dysfunction    per echo in 2011   Hyperlipidemia    Hypertension    Hypothyroidism    LVH (left ventricular hypertrophy)    per echo in 2011   Mitral valve prolapse    Neuropathy, peripheral    DR. Jannifer Franklin 12/15   Normal nuclear stress test 2011   OSA (obstructive sleep apnea)    Polyneuropathy in other diseases classified elsewhere ([place]) 05/21/2013   Rosacea 2019   MELANOMA CHEST REMOVED IN SITU   Sleep difficulties    Tinnitus    Past Surgical History:  Procedure Laterality Date   ABDOMINAL HYSTERECTOMY  2000   TOTAL   APPENDECTOMY  1989   CARDIOVASCULAR STRESS TEST  11/09/2009   EF 75%   CARPAL TUNNEL RELEASE  2002   bilateral   COLONOSCOPY WITH PROPOFOL N/A 10/23/2018   Procedure: COLONOSCOPY WITH  PROPOFOL;  Surgeon: [name], MD;  Location: [facility];  Service: Endoscopy;  Laterality: N/A;   ESOPHAGOGASTRODUODENOSCOPY N/A 11/16/2021   Procedure: ESOPHAGOGASTRODUODENOSCOPY (EGD);  Surgeon: [name], MD;  Location: [facility];  Service: Endoscopy;  Laterality: N/A;   ESOPHAGOGASTRODUODENOSCOPY (EGD) WITH PROPOFOL N/A 10/23/2018   Procedure: ESOPHAGOGASTRODUODENOSCOPY (EGD) WITH PROPOFOL;  Surgeon: [name], MD;  Location: [facility];  Service: Endoscopy;  Laterality: N/A;   EUS N/A 10/23/2018   Procedure: UPPER ENDOSCOPIC ULTRASOUND (EUS) RADIAL;  Surgeon: [name], MD;  Location: [facility];  Service: Endoscopy;  Laterality: N/A;   EUS N/A 11/16/2021   Procedure: UPPER ENDOSCOPIC ULTRASOUND (EUS) RADIAL;  Surgeon: [name], MD;  Location: [facility];  Service: Endoscopy;  Laterality: N/A;   MASS EXCISION Left 06/02/2019   Procedure: EXCISION OF SUBCUTANEOUS ABDOMINAL WALL MASSES LEFT LOWER QUADRANT;  Surgeon: [name], MD;  Location: [facility];  Service: General;  Laterality: Left;   TONSILLECTOMY  1958   TRANSTHORACIC ECHOCARDIOGRAM  11/01/2009   NORMAL LV FILLING AND DIASTOLIC DYSFUNCTION AND MILD AORTIC SCLEROSIS AND TRACE MITRAL REGURGITATION   Patient Active  Problem List   Diagnosis Date Noted   Glioblastoma of temporal lobe ([place]) 08/17/2022   Chronic serous otitis media of left ear 06/26/2022   Eustachian tube dysfunction, left 06/26/2022   Glioblastoma, IDH-wildtype ([place]) 05/03/2022   Brain lesion 04/10/2022   Acute ischemic stroke ([place]) 04/04/2022   Osteoarthritis of left knee 11/24/2021   Osteoarthritis of right knee 11/24/2021   Excessive cerumen in ear canal, right 05/29/2021   Sensorineural hearing loss (SNHL), bilateral 05/29/2021   Family history of brain cancer    Family history of throat cancer    Family history of breast cancer    Family history of ovarian cancer    Family history of prostate cancer    Family  history of stomach cancer    Family history of bladder cancer    Family history of uterine cancer    Anxiety 10/27/2020   Daytime somnolence 10/27/2020   Diabetic peripheral neuropathy associated with type 2 diabetes mellitus ([place]) 10/27/2020   Excessive sleepiness 10/27/2020   Hypertension 10/27/2020   Obstructive sleep apnea syndrome 10/27/2020   Peripheral neuropathy 10/27/2020   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 10/27/2020   Rosacea 10/27/2020   Vitamin D deficiency 10/27/2020   Deviated septum 05/09/2020   Trochanteric bursitis of left hip 12/30/2019   Special screening for malignant neoplasms, colon    Diverticulosis of colon without hemorrhage    Family history of pancreatic cancer    Pancreatic cyst    Osteoarthritis of knee 12/04/2017   Diabetic neuropathy ([place]) 12/03/2017   Newly diagnosed diabetes ([place]) 06/20/2017   Bilateral hearing loss 05/28/2017   Bilateral impacted cerumen 05/28/2017   Seasonal allergic rhinitis 05/28/2017   Tinnitus, bilateral 05/28/2017   Genetic testing 10/15/2016   DM2 (diabetes mellitus, type 2) ([place]) 01/10/2016   Polyneuropathy in other diseases classified elsewhere ([place]) 05/21/2013   History of hyperglycemia 04/01/2012   Atypical chest pain 12/28/2011   Benign hypertensive heart disease without heart failure 02/05/2011   Hyperlipidemia 02/05/2011   Hypothyroidism 02/05/2011   Palpitations 02/05/2011    ONSET DATE: script 07/26/22   REFERRING DIAG: C71.9 (ICD-10-CM) - Glioblastoma, IDH-wildtype ([place])  THERAPY DIAG:  Aphasia  Cognitive communication deficit  Rationale for Evaluation and Treatment: Rehabilitation  SUBJECTIVE:   SUBJECTIVE STATEMENT: "But finally got past it, thank you Lord," (pt re: illness and SE of chemo, last week)  Pt accompanied by: family member Yvonne [name]  PERTINENT HISTORY:  04/18/2022: Surgery-Left temporal craniotomy, resection with Dr. Tommi Rumps at [facility]. Path is glioblastoma. 05/16/2022-  Radiation Therapy With Concurrent Temozolomide 75 mg/m2 Daily Followed By Sequential Maintenance Temozolomide x 6-12 cycles. Currently having memory issues and referred by [name], NP. Pt with expressive aphasia since sx in September.  PAIN:  Are you having pain? No   PATIENT GOALS: Pt indicated she wants to improve her speaking.  OBJECTIVE:   DIAGNOSTIC FINDINGS:  1. 3 cm region of masslike signal abnormality in the left temporal lobe with small foci of enhancement, most concerning for primary CNS neoplasm. Demyelinating disease, vasculitis, and infection are less likely considerations. 2. No acute infarct.  STANDARDIZED ASSESSMENTS: QAB: Moderate Aphasia QAB Summary      Word comprehension 6.67  Sentence comprehension 3.33  Word finding 3.00  Grammatical construction 7.50  Speech motor programming 10.00  Repetition 8.33  Reading 10.00  QAB overall ======================================== SCORING (QAB Overall) 0.00-4.99= severe 5.00-7.49= moderate 7.50-8.89= mild 8.90-10.0= no aphasia 6.22      PATIENT REPORTED OUTCOME MEASURES (PROM): Communication  Effectiveness Survey: to be completed in first few therapy sessions. Score may be depressed due to decr'd awareness of linguistic errors. SLP to have pt and daughter fill out separate forms.   TODAY'S TREATMENT:                                                                                                                                         DATE:  09/10/22: Pt  with ability to have functional general conversation upon entry to [place] room. In conversation of depth, pt with much empty speech "stuff", "thing" prevalent, to decr effectiveness of communication.  SLP reviewed pt's homework with her - error awareness better today than at time of completion; Pt id'd 80% of errors with associated written words to a written target word. PAnswered random yes/no questions with 80% success and mod-max cues needed  usually for incr'd comprehension. Pt was unaware of her errors when reading (0/3). SLP modeled how to cue pt and how to enhance the task in order to maximize neuroplasticity - having pt generate a semantically related word, and then write the word next to the target word.  08/29/22: SLP worked with pt on responsive naming with simple cues. Consistent max/total cues necessary. Phonemic cues were not helpful whatsoever. Written letter cues were most helpful and used as last resort for cueing. 4/11 times pt said the correct response but did not realize she had said the correct response.  SLP suggested blanks be written for pt if she could not generate the target word, however large blanks ("write in the answer" type blanks) were written and not a blank for each letter. SLP demonstrated for husband type and level of cues for him to assist pt at home.   08/22/22: SLP assisted pt with homework blanks. Max cues necessary - phonemic, semantic, and visual cues were not helpful. Letter-fill-ins were necessary for pt to have any success at simple naming given a clue. SLP believes pt knew the object described due to her attempts (67% successful) at description strategy. In 2/6 words, pt stated the correct answer but did not react like she had said the correct word. SLP told daughter to use letter fill ins if pt was unable to generate the word on future homework. Pt highly motivated, but SLP concerned pt's anoimic deficits are very dense.  08/20/22: SLP reviewed results of evaluation and explained the rationale for x2/week ST. Pt appeared reticent about frequency and duration of ST. SLP provided pt homework and explained to daughter Suanne Marker in restroom) that she should observe pt's written responses and if pt is not providing correct answers someone will need to complete WITH the patient.  Due to pt's reduced awareness of errors, SLP may need to record pt and use this in a subsequent session to incr pt's awareness of her  error rate/severity.  PATIENT EDUCATION: Education details: see "today's treatment" Person educated: Patient and Child(ren)  Education method: Explanation, Demonstration, and Handouts Education comprehension: verbalized understanding, verbal cues required, and needs further education   GOALS: Goals reviewed with patient? Yes, generally  SHORT TERM GOALS: Target date: 09/28/22  Pt will functionally name 3 items in a common category within one minute using compensations PRN, in three sessions  Baseline: Goal status: Revised (downgraded)  2.  Pt will demo 70% error awareness in 5 minutes simple conversation, given min nonverbal cue in 3 sessions Baseline:  Goal status: Ongoing  3.  Pt will produce functional sentence responses 70% success given 2 attempts, in 3 sessions Baseline:  Goal status: Revised (downgraded)  4.  Pt will functionally describe 3 expressive language compensations in 2 sessions  Baseline:  Goal status: Ongoing  5.  Pt will demo understanding of 5 minutes simple conversation with two repeats requested by pt, in 3 sessions  Baseline:  Goal status: Ongoing  LONG TERM GOALS: Target date: 11/16/22  Pt will functionally participate in 10 minutes simple conversation in 3 sessions  Baseline:  Goal status: Ongoing  2. Pt will demo 80% error awareness in 5 minutes simple-mod complex conversation, given min nonverbal cue in 3 sessions Baseline:  Goal status: Ongoing  3.  Pt will functionally participate in 10 minutes simple-mod complex conversation with compensations PRN, in 3 sessions  Baseline:  Goal status: Ongoing  4.  Pt will demo 80% error awareness in 10 minutes simple-mod complex conversation, given min nonverbal cue in 3 sessions Baseline:  Goal status: Ongoing  5.  Pt will demo understanding of 10 minutes simple-mod complex conversation in 3 sessions  Baseline:  Goal status: Ongoing  6.  Pt will generate higher/better score on PROM in last 1-2  sessions of ST, compared to initial score Baseline:  Goal status: Ongoing  ASSESSMENT:  CLINICAL IMPRESSION: Mathew is a 77 y.o. female who was seen today for treatment of aphasia in light of tumor resection and is now post chemo/rad tx with ongoing regular chemotherapy, on her second day of Temodar of five. She exhibited expressive language deficits of anomia, and errors described as semantic paraphasias of which she was not always aware and complicated/hindered communication flow. SEE TX NOTE. Pt with persistent and dense anomic deficits in conversation and in language tasks today. Currently pt is managing meds, cooking, cleaning, and managing her home with cameras placed for Yvonne [name] and family to ensure her safety. Pt with questionable cognitive deficits which are challenging to unequivocally confirm at this point due to her expressive and receptive aphasia. Assessment for cognitive-linguistics may be necessary later.   OBJECTIVE IMPAIRMENTS: include attention, awareness, expressive language, receptive language, and aphasia. These impairments are limiting patient from household responsibilities, ADLs/IADLs, and effectively communicating at home and in community. Factors affecting potential to achieve goals and functional outcome are severity of impairments.. Patient will benefit from skilled SLP services to address above impairments and improve overall function.  REHAB POTENTIAL: Good  PLAN:  SLP FREQUENCY: 2x/week  SLP DURATION: 12 weeks  PLANNED INTERVENTIONS: Language facilitation, Cognitive reorganization, Internal/external aids, Functional tasks, Multimodal communication approach, SLP instruction and feedback, Compensatory strategies, and Patient/family education    [facility], [place] 09/10/2022, 9:35 AM

## 2022-09-12 ENCOUNTER — Other Ambulatory Visit (HOSPITAL_COMMUNITY): Payer: Self-pay

## 2022-09-12 ENCOUNTER — Ambulatory Visit: Payer: Medicare PPO

## 2022-09-12 DIAGNOSIS — R41841 Cognitive communication deficit: Secondary | ICD-10-CM | POA: Diagnosis not present

## 2022-09-12 DIAGNOSIS — R4701 Aphasia: Secondary | ICD-10-CM | POA: Diagnosis not present

## 2022-09-12 NOTE — Therapy (Signed)
OUTPATIENT SPEECH LANGUAGE PATHOLOGY TREATMENT   Patient Name: Yvonne [name] MRN: [id number] DOB:January 13, 1946, 77 y.o., female Today's Date: 09/12/2022  PCP: Kathyrn Lass, MD REFERRING PROVIDER: Jimmy Footman, NP  END OF SESSION:  End of Session - 09/12/22 1218     Visit Number 5    Number of Visits 25    Date for SLP Re-Evaluation 11/16/22    SLP Start Time 0933    SLP Stop Time  6063    SLP Time Calculation (min) 42 min    Activity Tolerance Patient tolerated treatment well               Past Medical History:  Diagnosis Date   Anxiety    Chest pressure    Degenerative arthritis    Deviated septum    Diabetes mellitus without complication ([place])    diet controlled   Diabetic neuropathy ([place]) 12/03/2017   DJD (degenerative joint disease) of knee    Family history of bladder cancer    Family history of brain cancer    Family history of breast cancer    Family history of ovarian cancer    Family history of prostate cancer    Family history of stomach cancer    Family history of throat cancer    Family history of uterine cancer    Graves disease    RADIOACTIVE IODINE 1983   H/O hiatal hernia    Heart palpitations    History of diastolic dysfunction    per echo in 2011   Hyperlipidemia    Hypertension    Hypothyroidism    LVH (left ventricular hypertrophy)    per echo in 2011   Mitral valve prolapse    Neuropathy, peripheral    DR. Jannifer Franklin 12/15   Normal nuclear stress test 2011   OSA (obstructive sleep apnea)    Polyneuropathy in other diseases classified elsewhere ([place]) 05/21/2013   Rosacea 2019   MELANOMA CHEST REMOVED IN SITU   Sleep difficulties    Tinnitus    Past Surgical History:  Procedure Laterality Date   ABDOMINAL HYSTERECTOMY  2000   TOTAL   APPENDECTOMY  1989   CARDIOVASCULAR STRESS TEST  11/09/2009   EF 75%   CARPAL TUNNEL RELEASE  2002   bilateral   COLONOSCOPY WITH PROPOFOL N/A 10/23/2018   Procedure: COLONOSCOPY WITH  PROPOFOL;  Surgeon: [name], MD;  Location: [facility];  Service: Endoscopy;  Laterality: N/A;   ESOPHAGOGASTRODUODENOSCOPY N/A 11/16/2021   Procedure: ESOPHAGOGASTRODUODENOSCOPY (EGD);  Surgeon: [name], MD;  Location: [facility];  Service: Endoscopy;  Laterality: N/A;   ESOPHAGOGASTRODUODENOSCOPY (EGD) WITH PROPOFOL N/A 10/23/2018   Procedure: ESOPHAGOGASTRODUODENOSCOPY (EGD) WITH PROPOFOL;  Surgeon: [name], MD;  Location: [facility];  Service: Endoscopy;  Laterality: N/A;   EUS N/A 10/23/2018   Procedure: UPPER ENDOSCOPIC ULTRASOUND (EUS) RADIAL;  Surgeon: [name], MD;  Location: [facility];  Service: Endoscopy;  Laterality: N/A;   EUS N/A 11/16/2021   Procedure: UPPER ENDOSCOPIC ULTRASOUND (EUS) RADIAL;  Surgeon: [name], MD;  Location: [facility];  Service: Endoscopy;  Laterality: N/A;   MASS EXCISION Left 06/02/2019   Procedure: EXCISION OF SUBCUTANEOUS ABDOMINAL WALL MASSES LEFT LOWER QUADRANT;  Surgeon: [name], MD;  Location: [place];  Service: General;  Laterality: Left;   TONSILLECTOMY  1958   TRANSTHORACIC ECHOCARDIOGRAM  11/01/2009   NORMAL LV FILLING AND DIASTOLIC DYSFUNCTION AND MILD AORTIC SCLEROSIS AND TRACE MITRAL REGURGITATION   Patient Active  Problem List   Diagnosis Date Noted   Glioblastoma of temporal lobe ([place]) 08/17/2022   Chronic serous otitis media of left ear 06/26/2022   Eustachian tube dysfunction, left 06/26/2022   Glioblastoma, IDH-wildtype ([place]) 05/03/2022   Brain lesion 04/10/2022   Acute ischemic stroke ([place]) 04/04/2022   Osteoarthritis of left knee 11/24/2021   Osteoarthritis of right knee 11/24/2021   Excessive cerumen in ear canal, right 05/29/2021   Sensorineural hearing loss (SNHL), bilateral 05/29/2021   Family history of brain cancer    Family history of throat cancer    Family history of breast cancer    Family history of ovarian cancer    Family history of prostate cancer    Family  history of stomach cancer    Family history of bladder cancer    Family history of uterine cancer    Anxiety 10/27/2020   Daytime somnolence 10/27/2020   Diabetic peripheral neuropathy associated with type 2 diabetes mellitus ([place]) 10/27/2020   Excessive sleepiness 10/27/2020   Hypertension 10/27/2020   Obstructive sleep apnea syndrome 10/27/2020   Peripheral neuropathy 10/27/2020   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 10/27/2020   Rosacea 10/27/2020   Vitamin D deficiency 10/27/2020   Deviated septum 05/09/2020   Trochanteric bursitis of left hip 12/30/2019   Special screening for malignant neoplasms, colon    Diverticulosis of colon without hemorrhage    Family history of pancreatic cancer    Pancreatic cyst    Osteoarthritis of knee 12/04/2017   Diabetic neuropathy ([place]) 12/03/2017   Newly diagnosed diabetes ([place]) 06/20/2017   Bilateral hearing loss 05/28/2017   Bilateral impacted cerumen 05/28/2017   Seasonal allergic rhinitis 05/28/2017   Tinnitus, bilateral 05/28/2017   Genetic testing 10/15/2016   DM2 (diabetes mellitus, type 2) ([place]) 01/10/2016   Polyneuropathy in other diseases classified elsewhere ([place]) 05/21/2013   History of hyperglycemia 04/01/2012   Atypical chest pain 12/28/2011   Benign hypertensive heart disease without heart failure 02/05/2011   Hyperlipidemia 02/05/2011   Hypothyroidism 02/05/2011   Palpitations 02/05/2011    ONSET DATE: script 07/26/22   REFERRING DIAG: C71.9 (ICD-10-CM) - Glioblastoma, IDH-wildtype ([place])  THERAPY DIAG:  Aphasia  Cognitive communication deficit  Rationale for Evaluation and Treatment: Rehabilitation  SUBJECTIVE:   SUBJECTIVE STATEMENT: "But finally got past it, thank you Lord," (pt re: illness and SE of chemo, last week)  Pt accompanied by: family member [name]  PERTINENT HISTORY:  04/18/2022: Surgery-Left temporal craniotomy, resection with Dr. Tommi Rumps at [facility]. Path is glioblastoma. 05/16/2022-  Radiation Therapy With Concurrent Temozolomide 75 mg/m2 Daily Followed By Sequential Maintenance Temozolomide x 6-12 cycles. Currently having memory issues and referred by [name], NP. Pt with expressive aphasia since sx in September.  PAIN:  Are you having pain? No   PATIENT GOALS: Pt indicated she wants to improve her speaking.  OBJECTIVE:   DIAGNOSTIC FINDINGS:  1. 3 cm region of masslike signal abnormality in the left temporal lobe with small foci of enhancement, most concerning for primary CNS neoplasm. Demyelinating disease, vasculitis, and infection are less likely considerations. 2. No acute infarct.  STANDARDIZED ASSESSMENTS: QAB: Moderate Aphasia QAB Summary      Word comprehension 6.67  Sentence comprehension 3.33  Word finding 3.00  Grammatical construction 7.50  Speech motor programming 10.00  Repetition 8.33  Reading 10.00  QAB overall ======================================== SCORING (QAB Overall) 0.00-4.99= severe 5.00-7.49= moderate 7.50-8.89= mild 8.90-10.0= no aphasia 6.22      PATIENT REPORTED OUTCOME MEASURES (PROM): Communication  Effectiveness Survey: to be completed in first few therapy sessions. Score may be depressed due to decr'd awareness of linguistic errors. SLP to have pt and daughter fill out separate forms.   TODAY'S TREATMENT:                                                                                                                                         DATE:  09/12/22: SLP assisted pt in reviewing her homework and had 89% success with simple 3 word yes/no questions and simple yes no questions. She req'd mod-max A with all erroneous responses. SLP noted today that pt has more success when able to write a portion of the word, or to a lesser degree, see the word. SLP told family with pt here today (son in law) that family should attempt to have pt write the word when she has difficulty with anomia. Pt named two  granddaughters' names independently.  09/10/22: Pt  with ability to have functional general conversation upon entry to [place] room. In conversation of depth, pt with much empty speech "stuff", "thing" prevalent, to decr effectiveness of communication.  SLP reviewed pt's homework with her - error awareness better today than at time of completion; Pt id'd 80% of errors with associated written words to a written target word. PAnswered random yes/no questions with 80% success and mod-max cues needed usually for incr'd comprehension. Pt was unaware of her errors when reading (0/3). SLP modeled how to cue pt and how to enhance the task in order to maximize neuroplasticity - having pt generate a semantically related word, and then write the word next to the target word.  08/29/22: SLP worked with pt on responsive naming with simple cues. Consistent max/total cues necessary. Phonemic cues were not helpful whatsoever. Written letter cues were most helpful and used as last resort for cueing. 4/11 times pt said the correct response but did not realize she had said the correct response.  SLP suggested blanks be written for pt if she could not generate the target word, however large blanks ("write in the answer" type blanks) were written and not a blank for each letter. SLP demonstrated for husband type and level of cues for him to assist pt at home.   08/22/22: SLP assisted pt with homework blanks. Max cues necessary - phonemic, semantic, and visual cues were not helpful. Letter-fill-ins were necessary for pt to have any success at simple naming given a clue. SLP believes pt knew the object described due to her attempts (67% successful) at description strategy. In 2/6 words, pt stated the correct answer but did not react like she had said the correct word. SLP told daughter to use letter fill ins if pt was unable to generate the word on future homework. Pt highly motivated, but SLP concerned pt's anoimic deficits are very  dense.  08/20/22: SLP reviewed results of evaluation and explained the  rationale for x2/week ST. Pt appeared reticent about frequency and duration of ST. SLP provided pt homework and explained to daughter Suanne Marker in restroom) that she should observe pt's written responses and if pt is not providing correct answers someone will need to complete WITH the patient.  Due to pt's reduced awareness of errors, SLP may need to record pt and use this in a subsequent session to incr pt's awareness of her error rate/severity.  PATIENT EDUCATION: Education details: see "today's treatment" Person educated: Patient and Child(ren) Education method: Explanation, Demonstration, and Handouts Education comprehension: verbalized understanding, verbal cues required, and needs further education   GOALS: Goals reviewed with patient? Yes, generally  SHORT TERM GOALS: Target date: 09/28/22  Pt will functionally name 3 items in a common category within one minute using compensations PRN, in three sessions  Baseline: Goal status: Revised (downgraded)  2.  Pt will demo 70% error awareness in 5 minutes simple conversation, given min nonverbal cue in 3 sessions Baseline:  Goal status: Ongoing  3.  Pt will produce functional sentence responses 70% success given 2 attempts, in 3 sessions Baseline:  Goal status: Revised (downgraded)  4.  Pt will functionally describe 3 expressive language compensations in 2 sessions  Baseline:  Goal status: Ongoing  5.  Pt will demo understanding of 5 minutes simple conversation with two repeats requested by pt, in 3 sessions  Baseline:  Goal status: Ongoing  LONG TERM GOALS: Target date: 11/16/22  Pt will functionally participate in 10 minutes simple conversation in 3 sessions  Baseline:  Goal status: Ongoing  2. Pt will demo 80% error awareness in 5 minutes simple-mod complex conversation, given min nonverbal cue in 3 sessions Baseline:  Goal status: Ongoing  3.  Pt will  functionally participate in 10 minutes simple-mod complex conversation with compensations PRN, in 3 sessions  Baseline:  Goal status: Ongoing  4.  Pt will demo 80% error awareness in 10 minutes simple-mod complex conversation, given min nonverbal cue in 3 sessions Baseline:  Goal status: Ongoing  5.  Pt will demo understanding of 10 minutes simple-mod complex conversation in 3 sessions  Baseline:  Goal status: Ongoing  6.  Pt will generate higher/better score on PROM in last 1-2 sessions of ST, compared to initial score Baseline:  Goal status: Ongoing  ASSESSMENT:  CLINICAL IMPRESSION: Caliope is a 77 y.o. female who was seen today for treatment of aphasia in light of tumor resection and is now post chemo/rad tx with ongoing regular chemotherapy. She exhibited expressive language deficits of anomia, and errors described as semantic paraphasias of which she was not always aware and complicated/hindered communication flow. SEE TX NOTE. Pt with persistent and dense anomic deficits in conversation and in language tasks today. Currently pt is managing meds, cooking, cleaning, and managing her home with cameras placed for [name] and family to ensure her safety. Pt with questionable cognitive deficits which are challenging to unequivocally confirm at this point due to her expressive and receptive aphasia. Assessment for cognitive-linguistics may be necessary later.   OBJECTIVE IMPAIRMENTS: include attention, awareness, expressive language, receptive language, and aphasia. These impairments are limiting patient from household responsibilities, ADLs/IADLs, and effectively communicating at home and in community. Factors affecting potential to achieve goals and functional outcome are severity of impairments.. Patient will benefit from skilled SLP services to address above impairments and improve overall function.  REHAB POTENTIAL: Good  PLAN:  SLP FREQUENCY: 2x/week  SLP DURATION: 12  weeks  PLANNED INTERVENTIONS: Language facilitation,  Cognitive reorganization, Internal/external aids, Functional tasks, Multimodal communication approach, SLP instruction and feedback, Compensatory strategies, and Patient/family education    [facility], [place] 09/12/2022, 12:26 PM

## 2022-09-17 DIAGNOSIS — D225 Melanocytic nevi of trunk: Secondary | ICD-10-CM | POA: Diagnosis not present

## 2022-09-17 DIAGNOSIS — Z85820 Personal history of malignant melanoma of skin: Secondary | ICD-10-CM | POA: Diagnosis not present

## 2022-09-17 DIAGNOSIS — L821 Other seborrheic keratosis: Secondary | ICD-10-CM | POA: Diagnosis not present

## 2022-09-17 DIAGNOSIS — D2272 Melanocytic nevi of left lower limb, including hip: Secondary | ICD-10-CM | POA: Diagnosis not present

## 2022-09-17 DIAGNOSIS — L820 Inflamed seborrheic keratosis: Secondary | ICD-10-CM | POA: Diagnosis not present

## 2022-09-17 DIAGNOSIS — D2271 Melanocytic nevi of right lower limb, including hip: Secondary | ICD-10-CM | POA: Diagnosis not present

## 2022-09-18 ENCOUNTER — Ambulatory Visit: Payer: Medicare PPO | Attending: Nurse Practitioner

## 2022-09-18 DIAGNOSIS — R41841 Cognitive communication deficit: Secondary | ICD-10-CM | POA: Diagnosis not present

## 2022-09-18 DIAGNOSIS — R4701 Aphasia: Secondary | ICD-10-CM | POA: Insufficient documentation

## 2022-09-18 NOTE — Therapy (Signed)
OUTPATIENT SPEECH LANGUAGE PATHOLOGY TREATMENT   Patient Name: Yvonne [name] MRN: [id number] DOB:[birth year], 77 y.o., female Today's Date: 09/18/2022  PCP: Kathyrn Lass, MD REFERRING PROVIDER: Jimmy Footman, NP  END OF SESSION:  End of Session - 09/18/22 0851     Visit Number 6    Number of Visits 25    Date for SLP Re-Evaluation 11/16/22    SLP Start Time 0849    SLP Stop Time  0930    SLP Time Calculation (min) 41 min    Activity Tolerance Patient tolerated treatment well               Past Medical History:  Diagnosis Date   Anxiety    Chest pressure    Degenerative arthritis    Deviated septum    Diabetes mellitus without complication ([place])    diet controlled   Diabetic neuropathy ([place]) 12/03/2017   DJD (degenerative joint disease) of knee    Family history of bladder cancer    Family history of brain cancer    Family history of breast cancer    Family history of ovarian cancer    Family history of prostate cancer    Family history of stomach cancer    Family history of throat cancer    Family history of uterine cancer    Graves disease    RADIOACTIVE IODINE 1983   H/O hiatal hernia    Heart palpitations    History of diastolic dysfunction    per echo in 2011   Hyperlipidemia    Hypertension    Hypothyroidism    LVH (left ventricular hypertrophy)    per echo in 2011   Mitral valve prolapse    Neuropathy, peripheral    DR. Jannifer Franklin 12/15   Normal nuclear stress test 2011   OSA (obstructive sleep apnea)    Polyneuropathy in other diseases classified elsewhere ([place]) 05/21/2013   Rosacea 2019   MELANOMA CHEST REMOVED IN SITU   Sleep difficulties    Tinnitus    Past Surgical History:  Procedure Laterality Date   ABDOMINAL HYSTERECTOMY  2000   TOTAL   APPENDECTOMY  1989   CARDIOVASCULAR STRESS TEST  11/09/2009   EF 75%   CARPAL TUNNEL RELEASE  2002   bilateral   COLONOSCOPY WITH PROPOFOL N/A 10/23/2018   Procedure: COLONOSCOPY WITH  PROPOFOL;  Surgeon: [name], MD;  Location: [facility];  Service: Endoscopy;  Laterality: N/A;   ESOPHAGOGASTRODUODENOSCOPY N/A 11/16/2021   Procedure: ESOPHAGOGASTRODUODENOSCOPY (EGD);  Surgeon: [name], MD;  Location: [facility];  Service: Endoscopy;  Laterality: N/A;   ESOPHAGOGASTRODUODENOSCOPY (EGD) WITH PROPOFOL N/A 10/23/2018   Procedure: ESOPHAGOGASTRODUODENOSCOPY (EGD) WITH PROPOFOL;  Surgeon: [name], MD;  Location: [facility];  Service: Endoscopy;  Laterality: N/A;   EUS N/A 10/23/2018   Procedure: UPPER ENDOSCOPIC ULTRASOUND (EUS) RADIAL;  Surgeon: [name], MD;  Location: [facility];  Service: Endoscopy;  Laterality: N/A;   EUS N/A 11/16/2021   Procedure: UPPER ENDOSCOPIC ULTRASOUND (EUS) RADIAL;  Surgeon: [name], MD;  Location: [facility];  Service: Endoscopy;  Laterality: N/A;   MASS EXCISION Left 06/02/2019   Procedure: EXCISION OF SUBCUTANEOUS ABDOMINAL WALL MASSES LEFT LOWER QUADRANT;  Surgeon: [name], MD;  Location: [facility];  Service: General;  Laterality: Left;   TONSILLECTOMY  1958   TRANSTHORACIC ECHOCARDIOGRAM  11/01/2009   NORMAL LV FILLING AND DIASTOLIC DYSFUNCTION AND MILD AORTIC SCLEROSIS AND TRACE MITRAL REGURGITATION   Patient Active  Problem List   Diagnosis Date Noted   Glioblastoma of temporal lobe ([place]) 08/17/2022   Chronic serous otitis media of left ear 06/26/2022   Eustachian tube dysfunction, left 06/26/2022   Glioblastoma, IDH-wildtype ([place]) 05/03/2022   Brain lesion 04/10/2022   Acute ischemic stroke ([place]) 04/04/2022   Osteoarthritis of left knee 11/24/2021   Osteoarthritis of right knee 11/24/2021   Excessive cerumen in ear canal, right 05/29/2021   Sensorineural hearing loss (SNHL), bilateral 05/29/2021   Family history of brain cancer    Family history of throat cancer    Family history of breast cancer    Family history of ovarian cancer    Family history of prostate cancer    Family  history of stomach cancer    Family history of bladder cancer    Family history of uterine cancer    Anxiety 10/27/2020   Daytime somnolence 10/27/2020   Diabetic peripheral neuropathy associated with type 2 diabetes mellitus ([place]) 10/27/2020   Excessive sleepiness 10/27/2020   Hypertension 10/27/2020   Obstructive sleep apnea syndrome 10/27/2020   Peripheral neuropathy 10/27/2020   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 10/27/2020   Rosacea 10/27/2020   Vitamin D deficiency 10/27/2020   Deviated septum 05/09/2020   Trochanteric bursitis of left hip 12/30/2019   Special screening for malignant neoplasms, colon    Diverticulosis of colon without hemorrhage    Family history of pancreatic cancer    Pancreatic cyst    Osteoarthritis of knee 12/04/2017   Diabetic neuropathy ([place]) 12/03/2017   Newly diagnosed diabetes ([place]) 06/20/2017   Bilateral hearing loss 05/28/2017   Bilateral impacted cerumen 05/28/2017   Seasonal allergic rhinitis 05/28/2017   Tinnitus, bilateral 05/28/2017   Genetic testing 10/15/2016   DM2 (diabetes mellitus, type 2) ([place]) 01/10/2016   Polyneuropathy in other diseases classified elsewhere ([place]) 05/21/2013   History of hyperglycemia 04/01/2012   Atypical chest pain 12/28/2011   Benign hypertensive heart disease without heart failure 02/05/2011   Hyperlipidemia 02/05/2011   Hypothyroidism 02/05/2011   Palpitations 02/05/2011    ONSET DATE: script 07/26/22   REFERRING DIAG: C71.9 (ICD-10-CM) - Glioblastoma, IDH-wildtype ([place])  THERAPY DIAG:  Aphasia  Cognitive communication deficit  Rationale for Evaluation and Treatment: Rehabilitation  SUBJECTIVE:   SUBJECTIVE STATEMENT: "It's more disgusting for me when looking at the thing and there's not the exact thing."  Pt accompanied by: self  PERTINENT HISTORY:  04/18/2022: Surgery-Left temporal craniotomy, resection with Dr. Tommi Rumps at [facility]. Path is glioblastoma. 05/16/2022- Radiation  Therapy With Concurrent Temozolomide 75 mg/m2 Daily Followed By Sequential Maintenance Temozolomide x 6-12 cycles. Currently having memory issues and referred by [name], NP. Pt with expressive aphasia since sx in September.  PAIN:  Are you having pain? No   PATIENT GOALS: Pt indicated she wants to improve her speaking.  OBJECTIVE:   DIAGNOSTIC FINDINGS:  1. 3 cm region of masslike signal abnormality in the left temporal lobe with small foci of enhancement, most concerning for primary CNS neoplasm. Demyelinating disease, vasculitis, and infection are less likely considerations. 2. No acute infarct.  STANDARDIZED ASSESSMENTS: QAB: Moderate Aphasia QAB Summary      Word comprehension 6.67  Sentence comprehension 3.33  Word finding 3.00  Grammatical construction 7.50  Speech motor programming 10.00  Repetition 8.33  Reading 10.00  QAB overall ======================================== SCORING (QAB Overall) 0.00-4.99= severe 5.00-7.49= moderate 7.50-8.89= mild 8.90-10.0= no aphasia 6.22      PATIENT REPORTED OUTCOME MEASURES (PROM): Communication Effectiveness Survey: to  be completed in first few therapy sessions. Score may be depressed due to decr'd awareness of linguistic errors. SLP to have pt and daughter fill out separate forms.   TODAY'S TREATMENT:                                                                                                                                         DATE:  09/18/22: SLP reviewed pt's homework with her and SLP encouraged pt to keep "talking simply" at this time. Pt and SLP worked today with beginning a personally relevant word list to focus her practice time. Pt able to name 2 things without cues (extra time necessary), and pt also req'd initial cue for using her phone to compensate but was successful using Google search to find a restaurant name. Spontaneous written cues were somewhat successful in pt's ability to generate  target word.  09/12/22: SLP assisted pt in reviewing her homework and had 89% success with simple 3 word yes/no questions and simple yes no questions. She req'd mod-max A with all erroneous responses. SLP noted today that pt has more success when able to write a portion of the word, or to a lesser degree, see the word. SLP told family with pt here today (son in law) that family should attempt to have pt write the word when she has difficulty with anomia. Pt named two granddaughters' names independently.  09/10/22: Pt  with ability to have functional general conversation upon entry to [place] room. In conversation of depth, pt with much empty speech "stuff", "thing" prevalent, to decr effectiveness of communication.  SLP reviewed pt's homework with her - error awareness better today than at time of completion; Pt id'd 80% of errors with associated written words to a written target word. PAnswered random yes/no questions with 80% success and mod-max cues needed usually for incr'd comprehension. Pt was unaware of her errors when reading (0/3). SLP modeled how to cue pt and how to enhance the task in order to maximize neuroplasticity - having pt generate a semantically related word, and then write the word next to the target word.  08/29/22: SLP worked with pt on responsive naming with simple cues. Consistent max/total cues necessary. Phonemic cues were not helpful whatsoever. Written letter cues were most helpful and used as last resort for cueing. 4/11 times pt said the correct response but did not realize she had said the correct response.  SLP suggested blanks be written for pt if she could not generate the target word, however large blanks ("write in the answer" type blanks) were written and not a blank for each letter. SLP demonstrated for husband type and level of cues for him to assist pt at home.   08/22/22: SLP assisted pt with homework blanks. Max cues necessary - phonemic, semantic, and visual cues were not  helpful. Letter-fill-ins were necessary for pt to have any success at simple  naming given a clue. SLP believes pt knew the object described due to her attempts (67% successful) at description strategy. In 2/6 words, pt stated the correct answer but did not react like she had said the correct word. SLP told daughter to use letter fill ins if pt was unable to generate the word on future homework. Pt highly motivated, but SLP concerned pt's anoimic deficits are very dense.  08/20/22: SLP reviewed results of evaluation and explained the rationale for x2/week ST. Pt appeared reticent about frequency and duration of ST. SLP provided pt homework and explained to daughter Suanne Marker in restroom) that she should observe pt's written responses and if pt is not providing correct answers someone will need to complete WITH the patient.  Due to pt's reduced awareness of errors, SLP may need to record pt and use this in a subsequent session to incr pt's awareness of her error rate/severity.  PATIENT EDUCATION: Education details: see "today's treatment" Person educated: Patient and Child(ren) Education method: Explanation, Demonstration, and Handouts Education comprehension: verbalized understanding, verbal cues required, and needs further education   GOALS: Goals reviewed with patient? Yes, generally  SHORT TERM GOALS: Target date: 09/28/22  Pt will functionally name 3 items in a common category within one minute using compensations PRN, in three sessions  Baseline: Goal status: Revised (downgraded)  2.  Pt will demo 70% error awareness in 5 minutes simple conversation, given min nonverbal cue in 3 sessions Baseline:  Goal status: Ongoing  3.  Pt will produce functional sentence responses 70% success given 2 attempts, in 3 sessions Baseline:  Goal status: Revised (downgraded)  4.  Pt will functionally describe 3 expressive language compensations in 2 sessions  Baseline:  Goal status: Ongoing  5.  Pt will  demo understanding of 5 minutes simple conversation with two repeats requested by pt, in 3 sessions  Baseline:  Goal status: Ongoing  LONG TERM GOALS: Target date: 11/16/22  Pt will functionally participate in 10 minutes simple conversation in 3 sessions  Baseline:  Goal status: Ongoing  2. Pt will demo 80% error awareness in 5 minutes simple-mod complex conversation, given min nonverbal cue in 3 sessions Baseline:  Goal status: Ongoing  3.  Pt will functionally participate in 10 minutes simple-mod complex conversation with compensations PRN, in 3 sessions  Baseline:  Goal status: Ongoing  4.  Pt will demo 80% error awareness in 10 minutes simple-mod complex conversation, given min nonverbal cue in 3 sessions Baseline:  Goal status: Ongoing  5.  Pt will demo understanding of 10 minutes simple-mod complex conversation in 3 sessions  Baseline:  Goal status: Ongoing  6.  Pt will generate higher/better score on PROM in last 1-2 sessions of ST, compared to initial score Baseline:  Goal status: Ongoing  ASSESSMENT:  CLINICAL IMPRESSION: Kendrea is a 77 y.o. female who was seen today for treatment of aphasia in light of tumor resection and is now post chemo/rad tx with ongoing regular chemotherapy. She exhibited expressive language deficits of anomia, and errors described as semantic paraphasias of which she was not always aware and complicated/hindered communication flow. SEE TX NOTE. Pt with persistent and dense anomic deficits in conversation and in language tasks today. Currently pt is managing meds, cooking, cleaning, and managing her home with cameras placed for [name] and family to ensure her safety. Pt with questionable cognitive deficits which are challenging to unequivocally confirm at this point due to her expressive and receptive aphasia. Assessment for cognitive-linguistics may be necessary  later.   OBJECTIVE IMPAIRMENTS: include attention, awareness, expressive language,  receptive language, and aphasia. These impairments are limiting patient from household responsibilities, ADLs/IADLs, and effectively communicating at home and in community. Factors affecting potential to achieve goals and functional outcome are severity of impairments.. Patient will benefit from skilled SLP services to address above impairments and improve overall function.  REHAB POTENTIAL: Good  PLAN:  SLP FREQUENCY: 2x/week  SLP DURATION: 12 weeks  PLANNED INTERVENTIONS: Language facilitation, Cognitive reorganization, Internal/external aids, Functional tasks, Multimodal communication approach, SLP instruction and feedback, Compensatory strategies, and Patient/family education    [facility], [place] 09/18/2022, 8:53 AM

## 2022-09-20 ENCOUNTER — Other Ambulatory Visit (HOSPITAL_COMMUNITY): Payer: Self-pay

## 2022-09-21 ENCOUNTER — Ambulatory Visit: Payer: Medicare PPO

## 2022-09-21 DIAGNOSIS — R41841 Cognitive communication deficit: Secondary | ICD-10-CM | POA: Diagnosis not present

## 2022-09-21 DIAGNOSIS — R4701 Aphasia: Secondary | ICD-10-CM | POA: Diagnosis not present

## 2022-09-21 NOTE — Therapy (Signed)
OUTPATIENT SPEECH LANGUAGE PATHOLOGY TREATMENT   Patient Name: Yvonne [name] MRN: [id number] DOB:[birth year], 77 y.o., female Today's Date: 09/21/2022  PCP: Kathyrn Lass, MD REFERRING PROVIDER: Jimmy Footman, NP  END OF SESSION:      Past Medical History:  Diagnosis Date   Anxiety    Chest pressure    Degenerative arthritis    Deviated septum    Diabetes mellitus without complication ([place])    diet controlled   Diabetic neuropathy ([place]) 12/03/2017   DJD (degenerative joint disease) of knee    Family history of bladder cancer    Family history of brain cancer    Family history of breast cancer    Family history of ovarian cancer    Family history of prostate cancer    Family history of stomach cancer    Family history of throat cancer    Family history of uterine cancer    Graves disease    RADIOACTIVE IODINE 1983   H/O hiatal hernia    Heart palpitations    History of diastolic dysfunction    per echo in 2011   Hyperlipidemia    Hypertension    Hypothyroidism    LVH (left ventricular hypertrophy)    per echo in 2011   Mitral valve prolapse    Neuropathy, peripheral    DR. Jannifer Franklin 12/15   Normal nuclear stress test 2011   OSA (obstructive sleep apnea)    Polyneuropathy in other diseases classified elsewhere ([place]) 05/21/2013   Rosacea 2019   MELANOMA CHEST REMOVED IN SITU   Sleep difficulties    Tinnitus    Past Surgical History:  Procedure Laterality Date   ABDOMINAL HYSTERECTOMY  2000   TOTAL   APPENDECTOMY  1989   CARDIOVASCULAR STRESS TEST  11/09/2009   EF 75%   CARPAL TUNNEL RELEASE  2002   bilateral   COLONOSCOPY WITH PROPOFOL N/A 10/23/2018   Procedure: COLONOSCOPY WITH PROPOFOL;  Surgeon: [name], MD;  Location: [facility];  Service: Endoscopy;  Laterality: N/A;   ESOPHAGOGASTRODUODENOSCOPY N/A 11/16/2021   Procedure: ESOPHAGOGASTRODUODENOSCOPY (EGD);  Surgeon: [name], MD;  Location: [facility];  Service:  Endoscopy;  Laterality: N/A;   ESOPHAGOGASTRODUODENOSCOPY (EGD) WITH PROPOFOL N/A 10/23/2018   Procedure: ESOPHAGOGASTRODUODENOSCOPY (EGD) WITH PROPOFOL;  Surgeon: [name], MD;  Location: [facility];  Service: Endoscopy;  Laterality: N/A;   EUS N/A 10/23/2018   Procedure: UPPER ENDOSCOPIC ULTRASOUND (EUS) RADIAL;  Surgeon: [name], MD;  Location: [facility];  Service: Endoscopy;  Laterality: N/A;   EUS N/A 11/16/2021   Procedure: UPPER ENDOSCOPIC ULTRASOUND (EUS) RADIAL;  Surgeon: [name], MD;  Location: [facility];  Service: Endoscopy;  Laterality: N/A;   MASS EXCISION Left 06/02/2019   Procedure: EXCISION OF SUBCUTANEOUS ABDOMINAL WALL MASSES LEFT LOWER QUADRANT;  Surgeon: [name], MD;  Location: [facility];  Service: General;  Laterality: Left;   [place] ECHOCARDIOGRAM  11/01/2009   NORMAL LV FILLING AND DIASTOLIC DYSFUNCTION AND MILD AORTIC SCLEROSIS AND TRACE MITRAL REGURGITATION   Patient Active Problem List   Diagnosis Date Noted   Glioblastoma of temporal lobe ([place]) 08/17/2022   Chronic serous otitis media of left ear 06/26/2022   Eustachian tube dysfunction, left 06/26/2022   Glioblastoma, IDH-wildtype ([place]) 05/03/2022   Brain lesion 04/10/2022   Acute ischemic stroke ([place]) 04/04/2022   Osteoarthritis of left knee 11/24/2021   Osteoarthritis of right knee 11/24/2021   Excessive cerumen in ear canal,  right 05/29/2021   Sensorineural hearing loss (SNHL), bilateral 05/29/2021   Family history of brain cancer    Family history of throat cancer    Family history of breast cancer    Family history of ovarian cancer    Family history of prostate cancer    Family history of stomach cancer    Family history of bladder cancer    Family history of uterine cancer    Anxiety 10/27/2020   Daytime somnolence 10/27/2020   Diabetic peripheral neuropathy associated with type 2 diabetes mellitus ([place]) 10/27/2020   Excessive  sleepiness 10/27/2020   Hypertension 10/27/2020   Obstructive sleep apnea syndrome 10/27/2020   Peripheral neuropathy 10/27/2020   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 10/27/2020   Rosacea 10/27/2020   Vitamin D deficiency 10/27/2020   Deviated septum 05/09/2020   Trochanteric bursitis of left hip 12/30/2019   Special screening for malignant neoplasms, colon    Diverticulosis of colon without hemorrhage    Family history of pancreatic cancer    Pancreatic cyst    Osteoarthritis of knee 12/04/2017   Diabetic neuropathy ([place]) 12/03/2017   Newly diagnosed diabetes ([place]) 06/20/2017   Bilateral hearing loss 05/28/2017   Bilateral impacted cerumen 05/28/2017   Seasonal allergic rhinitis 05/28/2017   Tinnitus, bilateral 05/28/2017   Genetic testing 10/15/2016   DM2 (diabetes mellitus, type 2) ([place]) 01/10/2016   Polyneuropathy in other diseases classified elsewhere ([place]) 05/21/2013   History of hyperglycemia 04/01/2012   Atypical chest pain 12/28/2011   Benign hypertensive heart disease without heart failure 02/05/2011   Hyperlipidemia 02/05/2011   Hypothyroidism 02/05/2011   Palpitations 02/05/2011    ONSET DATE: script 07/26/22   REFERRING DIAG: C71.9 (ICD-10-CM) - Glioblastoma, IDH-wildtype ([place])  THERAPY DIAG:  Aphasia  Rationale for Evaluation and Treatment: Rehabilitation  SUBJECTIVE:   SUBJECTIVE STATEMENT: "It's more disgusting for me when looking at the thing and there's not the exact thing."  Pt accompanied by: self  PERTINENT HISTORY:  04/18/2022: Surgery-Left temporal craniotomy, resection with Dr. Tommi Rumps at [facility]. Path is glioblastoma. 05/16/2022- Radiation Therapy With Concurrent Temozolomide 75 mg/m2 Daily Followed By Sequential Maintenance Temozolomide x 6-12 cycles. Currently having memory issues and referred by [name], NP. Pt with expressive aphasia since sx in September.  PAIN:  Are you having pain? No   PATIENT GOALS: Pt  indicated she wants to improve her speaking.  OBJECTIVE:   DIAGNOSTIC FINDINGS:  1. 3 cm region of masslike signal abnormality in the left temporal lobe with small foci of enhancement, most concerning for primary CNS neoplasm. Demyelinating disease, vasculitis, and infection are less likely considerations. 2. No acute infarct.  STANDARDIZED ASSESSMENTS: QAB: Moderate Aphasia QAB Summary      Word comprehension 6.67  Sentence comprehension 3.33  Word finding 3.00  Grammatical construction 7.50  Speech motor programming 10.00  Repetition 8.33  Reading 10.00  QAB overall ======================================== SCORING (QAB Overall) 0.00-4.99= severe 5.00-7.49= moderate 7.50-8.89= mild 8.90-10.0= no aphasia 6.22      PATIENT REPORTED OUTCOME MEASURES (PROM): Communication Effectiveness Survey: to be completed in first few therapy sessions. Score may be depressed due to decr'd awareness of linguistic errors. SLP to have pt and daughter fill out separate forms.   TODAY'S TREATMENT:  DATE:  09/21/22:   09/18/22: SLP reviewed pt's homework with her and SLP encouraged pt to keep "talking simply" at this time. Pt and SLP worked today with beginning a personally relevant word list to focus her practice time. Pt able to name 2 things without cues (extra time necessary), and pt also req'd initial cue for using her phone to compensate but was successful using Google search to find a restaurant name. Spontaneous written cues were somewhat successful in pt's ability to generate target word.  09/12/22: SLP assisted pt in reviewing her homework and had 89% success with simple 3 word yes/no questions and simple yes no questions. She req'd mod-max A with all erroneous responses. SLP noted today that pt has more success when able to write a portion of the word, or  to a lesser degree, see the word. SLP told family with pt here today (son in law) that family should attempt to have pt write the word when she has difficulty with anomia. Pt named two granddaughters' names independently.  09/10/22: Pt  with ability to have functional general conversation upon entry to [place] room. In conversation of depth, pt with much empty speech "stuff", "thing" prevalent, to decr effectiveness of communication.  SLP reviewed pt's homework with her - error awareness better today than at time of completion; Pt id'd 80% of errors with associated written words to a written target word. PAnswered random yes/no questions with 80% success and mod-max cues needed usually for incr'd comprehension. Pt was unaware of her errors when reading (0/3). SLP modeled how to cue pt and how to enhance the task in order to maximize neuroplasticity - having pt generate a semantically related word, and then write the word next to the target word.  08/29/22: SLP worked with pt on responsive naming with simple cues. Consistent max/total cues necessary. Phonemic cues were not helpful whatsoever. Written letter cues were most helpful and used as last resort for cueing. 4/11 times pt said the correct response but did not realize she had said the correct response.  SLP suggested blanks be written for pt if she could not generate the target word, however large blanks ("write in the answer" type blanks) were written and not a blank for each letter. SLP demonstrated for husband type and level of cues for him to assist pt at home.   08/22/22: SLP assisted pt with homework blanks. Max cues necessary - phonemic, semantic, and visual cues were not helpful. Letter-fill-ins were necessary for pt to have any success at simple naming given a clue. SLP believes pt knew the object described due to her attempts (67% successful) at description strategy. In 2/6 words, pt stated the correct answer but did not react like she had said the  correct word. SLP told daughter to use letter fill ins if pt was unable to generate the word on future homework. Pt highly motivated, but SLP concerned pt's anoimic deficits are very dense.  08/20/22: SLP reviewed results of evaluation and explained the rationale for x2/week ST. Pt appeared reticent about frequency and duration of ST. SLP provided pt homework and explained to daughter Suanne Marker in restroom) that she should observe pt's written responses and if pt is not providing correct answers someone will need to complete WITH the patient.  Due to pt's reduced awareness of errors, SLP may need to record pt and use this in a subsequent session to incr pt's awareness of her error rate/severity.  PATIENT EDUCATION: Education details: see "today's treatment" Person  educated: Patient and Child(ren) Education method: Explanation, Demonstration, and Handouts Education comprehension: verbalized understanding, verbal cues required, and needs further education   GOALS: Goals reviewed with patient? Yes, generally  SHORT TERM GOALS: Target date: 09/28/22  Pt will functionally name 3 items in a common category within one minute using compensations PRN, in three sessions  Baseline: Goal status: Revised (downgraded)  2.  Pt will demo 70% error awareness in 5 minutes simple conversation, given min nonverbal cue in 3 sessions Baseline:  Goal status: Ongoing  3.  Pt will produce functional sentence responses 70% success given 2 attempts, in 3 sessions Baseline:  Goal status: Revised (downgraded)  4.  Pt will functionally describe 3 expressive language compensations in 2 sessions  Baseline:  Goal status: Ongoing  5.  Pt will demo understanding of 5 minutes simple conversation with two repeats requested by pt, in 3 sessions  Baseline:  Goal status: Ongoing  LONG TERM GOALS: Target date: 11/16/22  Pt will functionally participate in 10 minutes simple conversation in 3 sessions  Baseline:  Goal  status: Ongoing  2. Pt will demo 80% error awareness in 5 minutes simple-mod complex conversation, given min nonverbal cue in 3 sessions Baseline:  Goal status: Ongoing  3.  Pt will functionally participate in 10 minutes simple-mod complex conversation with compensations PRN, in 3 sessions  Baseline:  Goal status: Ongoing  4.  Pt will demo 80% error awareness in 10 minutes simple-mod complex conversation, given min nonverbal cue in 3 sessions Baseline:  Goal status: Ongoing  5.  Pt will demo understanding of 10 minutes simple-mod complex conversation in 3 sessions  Baseline:  Goal status: Ongoing  6.  Pt will generate higher/better score on PROM in last 1-2 sessions of ST, compared to initial score Baseline:  Goal status: Ongoing  ASSESSMENT:  CLINICAL IMPRESSION: Akelia is a 77 y.o. female who was seen today for treatment of aphasia in light of tumor resection and is now post chemo/rad tx with ongoing regular chemotherapy. She exhibited expressive language deficits of anomia, and errors described as semantic paraphasias of which she was not always aware and complicated/hindered communication flow. SEE TX NOTE. Pt with persistent and dense anomic deficits in conversation and in language tasks today. Currently pt is managing meds, cooking, cleaning, and managing her home with cameras placed for [name] and family to ensure her safety. Pt with questionable cognitive deficits which are challenging to unequivocally confirm at this point due to her expressive and receptive aphasia. Assessment for cognitive-linguistics may be necessary later.   OBJECTIVE IMPAIRMENTS: include attention, awareness, expressive language, receptive language, and aphasia. These impairments are limiting patient from household responsibilities, ADLs/IADLs, and effectively communicating at home and in community. Factors affecting potential to achieve goals and functional outcome are severity of impairments.. Patient will  benefit from skilled SLP services to address above impairments and improve overall function.  REHAB POTENTIAL: Good  PLAN:  SLP FREQUENCY: 2x/week  SLP DURATION: 12 weeks  PLANNED INTERVENTIONS: Language facilitation, Cognitive reorganization, Internal/external aids, Functional tasks, Multimodal communication approach, SLP instruction and feedback, Compensatory strategies, and Patient/family education    [facility], [place] 09/21/2022, 8:56 AM

## 2022-09-24 ENCOUNTER — Other Ambulatory Visit: Payer: Self-pay

## 2022-09-25 ENCOUNTER — Other Ambulatory Visit (HOSPITAL_COMMUNITY): Payer: Self-pay

## 2022-09-25 ENCOUNTER — Ambulatory Visit: Payer: Medicare PPO | Admitting: Internal Medicine

## 2022-09-25 ENCOUNTER — Ambulatory Visit: Payer: Medicare PPO

## 2022-09-25 ENCOUNTER — Other Ambulatory Visit: Payer: Medicare PPO

## 2022-09-25 DIAGNOSIS — R41841 Cognitive communication deficit: Secondary | ICD-10-CM | POA: Diagnosis not present

## 2022-09-25 DIAGNOSIS — R4701 Aphasia: Secondary | ICD-10-CM

## 2022-09-25 NOTE — Therapy (Signed)
OUTPATIENT SPEECH LANGUAGE PATHOLOGY TREATMENT   Patient Name: Yvonne [name] MRN: [id number] DOB:[birth year], 77 y.o., female Today's Date: 09/25/2022  PCP: Kathyrn Lass, MD REFERRING PROVIDER: Jimmy Footman, NP  END OF SESSION:  End of Session - 09/25/22 0940     Visit Number 8    Number of Visits 25    Date for SLP Re-Evaluation 11/16/22    SLP Start Time 0851    SLP Stop Time  0931    SLP Time Calculation (min) 40 min    Activity Tolerance Patient tolerated treatment well                 Past Medical History:  Diagnosis Date   Anxiety    Chest pressure    Degenerative arthritis    Deviated septum    Diabetes mellitus without complication ([place])    diet controlled   Diabetic neuropathy ([place]) 12/03/2017   DJD (degenerative joint disease) of knee    Family history of bladder cancer    Family history of brain cancer    Family history of breast cancer    Family history of ovarian cancer    Family history of prostate cancer    Family history of stomach cancer    Family history of throat cancer    Family history of uterine cancer    Graves disease    RADIOACTIVE IODINE 1983   H/O hiatal hernia    Heart palpitations    History of diastolic dysfunction    per echo in 2011   Hyperlipidemia    Hypertension    Hypothyroidism    LVH (left ventricular hypertrophy)    per echo in 2011   Mitral valve prolapse    Neuropathy, peripheral    DR. Jannifer Franklin 12/15   Normal nuclear stress test 2011   OSA (obstructive sleep apnea)    Polyneuropathy in other diseases classified elsewhere ([place]) 05/21/2013   Rosacea 2019   MELANOMA CHEST REMOVED IN SITU   Sleep difficulties    Tinnitus    Past Surgical History:  Procedure Laterality Date   ABDOMINAL HYSTERECTOMY  2000   TOTAL   APPENDECTOMY  1989   CARDIOVASCULAR STRESS TEST  11/09/2009   EF 75%   CARPAL TUNNEL RELEASE  2002   bilateral   COLONOSCOPY WITH PROPOFOL N/A 10/23/2018   Procedure: COLONOSCOPY  WITH PROPOFOL;  Surgeon: [name], MD;  Location: [facility];  Service: Endoscopy;  Laterality: N/A;   ESOPHAGOGASTRODUODENOSCOPY N/A 11/16/2021   Procedure: ESOPHAGOGASTRODUODENOSCOPY (EGD);  Surgeon: [name], MD;  Location: [facility];  Service: Endoscopy;  Laterality: N/A;   ESOPHAGOGASTRODUODENOSCOPY (EGD) WITH PROPOFOL N/A 10/23/2018   Procedure: ESOPHAGOGASTRODUODENOSCOPY (EGD) WITH PROPOFOL;  Surgeon: [name], MD;  Location: [facility];  Service: Endoscopy;  Laterality: N/A;   EUS N/A 10/23/2018   Procedure: UPPER ENDOSCOPIC ULTRASOUND (EUS) RADIAL;  Surgeon: [name], MD;  Location: [facility];  Service: Endoscopy;  Laterality: N/A;   EUS N/A 11/16/2021   Procedure: UPPER ENDOSCOPIC ULTRASOUND (EUS) RADIAL;  Surgeon: [name], MD;  Location: [facility];  Service: Endoscopy;  Laterality: N/A;   MASS EXCISION Left 06/02/2019   Procedure: EXCISION OF SUBCUTANEOUS ABDOMINAL WALL MASSES LEFT LOWER QUADRANT;  Surgeon: [name], MD;  Location: [facility];  Service: General;  Laterality: Left;   TONSILLECTOMY  1958   TRANSTHORACIC ECHOCARDIOGRAM  11/01/2009   NORMAL LV FILLING AND DIASTOLIC DYSFUNCTION AND MILD AORTIC SCLEROSIS AND TRACE MITRAL REGURGITATION  Patient Active Problem List   Diagnosis Date Noted   Glioblastoma of temporal lobe ([place]) 08/17/2022   Chronic serous otitis media of left ear 06/26/2022   Eustachian tube dysfunction, left 06/26/2022   Glioblastoma, IDH-wildtype ([place]) 05/03/2022   Brain lesion 04/10/2022   Acute ischemic stroke ([place]) 04/04/2022   Osteoarthritis of left knee 11/24/2021   Osteoarthritis of right knee 11/24/2021   Excessive cerumen in ear canal, right 05/29/2021   Sensorineural hearing loss (SNHL), bilateral 05/29/2021   Family history of brain cancer    Family history of throat cancer    Family history of breast cancer    Family history of ovarian cancer    Family history of prostate cancer    Family  history of stomach cancer    Family history of bladder cancer    Family history of uterine cancer    Anxiety 10/27/2020   Daytime somnolence 10/27/2020   Diabetic peripheral neuropathy associated with type 2 diabetes mellitus ([place]) 10/27/2020   Excessive sleepiness 10/27/2020   Hypertension 10/27/2020   Obstructive sleep apnea syndrome 10/27/2020   Peripheral neuropathy 10/27/2020   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 10/27/2020   Rosacea 10/27/2020   Vitamin D deficiency 10/27/2020   Deviated septum 05/09/2020   Trochanteric bursitis of left hip 12/30/2019   Special screening for malignant neoplasms, colon    Diverticulosis of colon without hemorrhage    Family history of pancreatic cancer    Pancreatic cyst    Osteoarthritis of knee 12/04/2017   Diabetic neuropathy ([place]) 12/03/2017   Newly diagnosed diabetes ([place]) 06/20/2017   Bilateral hearing loss 05/28/2017   Bilateral impacted cerumen 05/28/2017   Seasonal allergic rhinitis 05/28/2017   Tinnitus, bilateral 05/28/2017   Genetic testing 10/15/2016   DM2 (diabetes mellitus, type 2) ([place]) 01/10/2016   Polyneuropathy in other diseases classified elsewhere ([place]) 05/21/2013   History of hyperglycemia 04/01/2012   Atypical chest pain 12/28/2011   Benign hypertensive heart disease without heart failure 02/05/2011   Hyperlipidemia 02/05/2011   Hypothyroidism 02/05/2011   Palpitations 02/05/2011    ONSET DATE: script 07/26/22   REFERRING DIAG: C71.9 (ICD-10-CM) - Glioblastoma, IDH-wildtype ([place])  THERAPY DIAG:  Aphasia  Cognitive communication deficit  Rationale for Evaluation and Treatment: Rehabilitation  SUBJECTIVE:   SUBJECTIVE STATEMENT: "I go to [facility] tomorrow for a MSI (MRI)."  Pt accompanied by: self  PERTINENT HISTORY:  04/18/2022: Surgery-Left temporal craniotomy, resection with Dr. Tommi Rumps at [facility]. Path is glioblastoma. 05/16/2022- Radiation Therapy With Concurrent Temozolomide 75 mg/m2 Daily  Followed By Sequential Maintenance Temozolomide x 6-12 cycles. Currently having memory issues and referred by [name], NP. Pt with expressive aphasia since sx in September.  PAIN:  Are you having pain? No   PATIENT GOALS: Pt indicated she wants to improve her speaking.  OBJECTIVE:   DIAGNOSTIC FINDINGS:  1. 3 cm region of masslike signal abnormality in the left temporal lobe with small foci of enhancement, most concerning for primary CNS neoplasm. Demyelinating disease, vasculitis, and infection are less likely considerations. 2. No acute infarct.  STANDARDIZED ASSESSMENTS: QAB: Moderate Aphasia QAB Summary      Word comprehension 6.67  Sentence comprehension 3.33  Word finding 3.00  Grammatical construction 7.50  Speech motor programming 10.00  Repetition 8.33  Reading 10.00  QAB overall ======================================== SCORING (QAB Overall) 0.00-4.99= severe 5.00-7.49= moderate 7.50-8.89= mild 8.90-10.0= no aphasia 6.22      PATIENT REPORTED OUTCOME MEASURES (PROM): Communication Effectiveness Survey: to be completed in first few  therapy sessions. Score may be depressed due to decr'd awareness of linguistic errors. SLP to have pt and daughter fill out separate forms.   TODAY'S TREATMENT:                                                                                                                                         DATE:  09/25/22: Pt tearful about MRI and report tomorrow at [facility]. SLP provided verbal support. PT had written 50% of the content of her frig on the paper she was given last session, along with the items in her freezer she would eat. SLP explained in multiple ways including demonstration that pt would benefit from having labels in her home for kitchen items, and explained rationale with gesture and repetition to ensure pt comprehension. Pt to make labels (with assistance PRN)  09/21/22: Pt brought back her personally relevant  word list with some aphasic answers written on it. SLP assisted pt correct these responses and then practiced stores and educated pt about using her phone map app as another way to compensate for store/location. Pt req'd mod A usually for correcting the words.   09/18/22: SLP reviewed pt's homework with her and SLP encouraged pt to keep "talking simply" at this time. Pt and SLP worked today with beginning a personally relevant word list to focus her practice time. Pt able to name 2 things without cues (extra time necessary), and pt also req'd initial cue for using her phone to compensate but was successful using Google search to find a restaurant name. Spontaneous written cues were somewhat successful in pt's ability to generate target word.  09/12/22: SLP assisted pt in reviewing her homework and had 89% success with simple 3 word yes/no questions and simple yes no questions. She req'd mod-max A with all erroneous responses. SLP noted today that pt has more success when able to write a portion of the word, or to a lesser degree, see the word. SLP told family with pt here today (son in law) that family should attempt to have pt write the word when she has difficulty with anomia. Pt named two granddaughters' names independently.  09/10/22: Pt  with ability to have functional general conversation upon entry to [place] room. In conversation of depth, pt with much empty speech "stuff", "thing" prevalent, to decr effectiveness of communication.  SLP reviewed pt's homework with her - error awareness better today than at time of completion; Pt id'd 80% of errors with associated written words to a written target word. PAnswered random yes/no questions with 80% success and mod-max cues needed usually for incr'd comprehension. Pt was unaware of her errors when reading (0/3). SLP modeled how to cue pt and how to enhance the task in order to maximize neuroplasticity - having pt generate a semantically related word, and then write  the word next to the target word.  08/29/22: SLP worked with  pt on responsive naming with simple cues. Consistent max/total cues necessary. Phonemic cues were not helpful whatsoever. Written letter cues were most helpful and used as last resort for cueing. 4/11 times pt said the correct response but did not realize she had said the correct response.  SLP suggested blanks be written for pt if she could not generate the target word, however large blanks ("write in the answer" type blanks) were written and not a blank for each letter. SLP demonstrated for husband type and level of cues for him to assist pt at home.   08/22/22: SLP assisted pt with homework blanks. Max cues necessary - phonemic, semantic, and visual cues were not helpful. Letter-fill-ins were necessary for pt to have any success at simple naming given a clue. SLP believes pt knew the object described due to her attempts (67% successful) at description strategy. In 2/6 words, pt stated the correct answer but did not react like she had said the correct word. SLP told daughter to use letter fill ins if pt was unable to generate the word on future homework. Pt highly motivated, but SLP concerned pt's anoimic deficits are very dense.  08/20/22: SLP reviewed results of evaluation and explained the rationale for x2/week ST. Pt appeared reticent about frequency and duration of ST. SLP provided pt homework and explained to daughter Yvonne [name] in restroom) that she should observe pt's written responses and if pt is not providing correct answers someone will need to complete WITH the patient.  Due to pt's reduced awareness of errors, SLP may need to record pt and use this in a subsequent session to incr pt's awareness of her error rate/severity.  PATIENT EDUCATION: Education details: see "today's treatment" Person educated: Patient and Child(ren) Education method: Explanation, Demonstration, and Handouts Education comprehension: verbalized understanding,  verbal cues required, and needs further education   GOALS: Goals reviewed with patient? Yes, generally  SHORT TERM GOALS: Target date: 09/28/22  Pt will functionally name 3 items in a common category within one minute using compensations PRN, in three sessions  Baseline: Goal status: Revised (downgraded)  2.  Pt will demo 70% error awareness in 5 minutes simple conversation, given min nonverbal cue in 3 sessions Baseline:  Goal status: Ongoing  3.  Pt will produce functional sentence responses 70% success given 2 attempts, in 3 sessions Baseline:  Goal status: Revised (downgraded)  4.  Pt will functionally describe 3 expressive language compensations in 2 sessions  Baseline:  Goal status: Ongoing  5.  Pt will demo understanding of 5 minutes simple conversation with two repeats requested by pt, in 3 sessions  Baseline:  Goal status: Ongoing  LONG TERM GOALS: Target date: 11/16/22  Pt will functionally participate in 10 minutes simple conversation in 3 sessions  Baseline:  Goal status: Ongoing  2. Pt will demo 80% error awareness in 5 minutes simple-mod complex conversation, given min nonverbal cue in 3 sessions Baseline:  Goal status: Ongoing  3.  Pt will functionally participate in 10 minutes simple-mod complex conversation with compensations PRN, in 3 sessions  Baseline:  Goal status: Ongoing  4.  Pt will demo 80% error awareness in 10 minutes simple-mod complex conversation, given min nonverbal cue in 3 sessions Baseline:  Goal status: Ongoing  5.  Pt will demo understanding of 10 minutes simple-mod complex conversation in 3 sessions  Baseline:  Goal status: Ongoing  6.  Pt will generate higher/better score on PROM in last 1-2 sessions of ST, compared to initial score  Baseline:  Goal status: Ongoing  ASSESSMENT:  CLINICAL IMPRESSION: Yvonne [name] is a 77 y.o. female who was seen today for treatment of aphasia in light of tumor resection and is now post chemo/rad tx  with ongoing regular chemotherapy. She exhibited expressive language deficits of anomia, and errors described as semantic paraphasias of which she was not always aware and complicated/hindered communication flow. SEE TX NOTE. Currently pt is managing meds, cooking, cleaning, and managing her home with cameras placed for Yvonne [name] and family to ensure her safety. Pt with questionable cognitive deficits which are challenging to unequivocally confirm at this point due to her expressive and receptive aphasia. Assessment for cognitive-linguistics may be necessary later.   OBJECTIVE IMPAIRMENTS: include attention, awareness, expressive language, receptive language, and aphasia. These impairments are limiting patient from household responsibilities, ADLs/IADLs, and effectively communicating at home and in community. Factors affecting potential to achieve goals and functional outcome are severity of impairments.. Patient will benefit from skilled SLP services to address above impairments and improve overall function.  REHAB POTENTIAL: Good  PLAN:  SLP FREQUENCY: 2x/week  SLP DURATION: 12 weeks  PLANNED INTERVENTIONS: Language facilitation, Cognitive reorganization, Internal/external aids, Functional tasks, Multimodal communication approach, SLP instruction and feedback, Compensatory strategies, and Patient/family education    [facility], [place] 09/25/2022, 9:41 AM

## 2022-09-26 DIAGNOSIS — C712 Malignant neoplasm of temporal lobe: Secondary | ICD-10-CM | POA: Diagnosis not present

## 2022-09-27 ENCOUNTER — Other Ambulatory Visit: Payer: Self-pay

## 2022-09-27 ENCOUNTER — Other Ambulatory Visit (HOSPITAL_COMMUNITY): Payer: Self-pay

## 2022-09-27 ENCOUNTER — Inpatient Hospital Stay (HOSPITAL_BASED_OUTPATIENT_CLINIC_OR_DEPARTMENT_OTHER): Payer: Medicare PPO | Admitting: Internal Medicine

## 2022-09-27 ENCOUNTER — Inpatient Hospital Stay: Payer: Medicare PPO | Attending: Internal Medicine

## 2022-09-27 ENCOUNTER — Ambulatory Visit
Admission: RE | Admit: 2022-09-27 | Discharge: 2022-09-27 | Disposition: A | Discharge status: Home / Self Care | Payer: Self-pay | Source: Ambulatory Visit | Attending: Internal Medicine | Admitting: Internal Medicine

## 2022-09-27 ENCOUNTER — Other Ambulatory Visit: Payer: Self-pay | Admitting: Pharmacist

## 2022-09-27 ENCOUNTER — Other Ambulatory Visit: Payer: Self-pay | Admitting: *Deleted

## 2022-09-27 VITALS — BP 152/74 | HR 69 | Temp 97.7°F | Resp 18 | Wt 150.1 lb

## 2022-09-27 DIAGNOSIS — Z800 Family history of malignant neoplasm of digestive organs: Secondary | ICD-10-CM | POA: Diagnosis not present

## 2022-09-27 DIAGNOSIS — Z85820 Personal history of malignant melanoma of skin: Secondary | ICD-10-CM | POA: Diagnosis not present

## 2022-09-27 DIAGNOSIS — Z8052 Family history of malignant neoplasm of bladder: Secondary | ICD-10-CM | POA: Diagnosis not present

## 2022-09-27 DIAGNOSIS — G4730 Sleep apnea, unspecified: Secondary | ICD-10-CM | POA: Diagnosis not present

## 2022-09-27 DIAGNOSIS — Z8042 Family history of malignant neoplasm of prostate: Secondary | ICD-10-CM | POA: Diagnosis not present

## 2022-09-27 DIAGNOSIS — C719 Malignant neoplasm of brain, unspecified: Secondary | ICD-10-CM

## 2022-09-27 DIAGNOSIS — Z79890 Hormone replacement therapy (postmenopausal): Secondary | ICD-10-CM | POA: Diagnosis not present

## 2022-09-27 DIAGNOSIS — E039 Hypothyroidism, unspecified: Secondary | ICD-10-CM | POA: Diagnosis not present

## 2022-09-27 DIAGNOSIS — Z79899 Other long term (current) drug therapy: Secondary | ICD-10-CM | POA: Diagnosis not present

## 2022-09-27 DIAGNOSIS — I341 Nonrheumatic mitral (valve) prolapse: Secondary | ICD-10-CM | POA: Insufficient documentation

## 2022-09-27 DIAGNOSIS — Z7984 Long term (current) use of oral hypoglycemic drugs: Secondary | ICD-10-CM | POA: Diagnosis not present

## 2022-09-27 DIAGNOSIS — Z801 Family history of malignant neoplasm of trachea, bronchus and lung: Secondary | ICD-10-CM | POA: Insufficient documentation

## 2022-09-27 DIAGNOSIS — C712 Malignant neoplasm of temporal lobe: Secondary | ICD-10-CM | POA: Diagnosis not present

## 2022-09-27 DIAGNOSIS — Z8041 Family history of malignant neoplasm of ovary: Secondary | ICD-10-CM | POA: Diagnosis not present

## 2022-09-27 DIAGNOSIS — I10 Essential (primary) hypertension: Secondary | ICD-10-CM | POA: Diagnosis not present

## 2022-09-27 DIAGNOSIS — Z803 Family history of malignant neoplasm of breast: Secondary | ICD-10-CM | POA: Insufficient documentation

## 2022-09-27 DIAGNOSIS — E785 Hyperlipidemia, unspecified: Secondary | ICD-10-CM | POA: Insufficient documentation

## 2022-09-27 LAB — CMP (CANCER CENTER ONLY)
ALT: 18 U/L (ref 0–44)
AST: 13 U/L — ABNORMAL LOW (ref 15–41)
Albumin: 4.1 g/dL (ref 3.5–5.0)
Alkaline Phosphatase: 56 U/L (ref 38–126)
Anion gap: 7 (ref 5–15)
BUN: 24 mg/dL — ABNORMAL HIGH (ref 8–23)
CO2: 28 mmol/L (ref 22–32)
Calcium: 9.4 mg/dL (ref 8.9–10.3)
Chloride: 100 mmol/L (ref 98–111)
Creatinine: 0.7 mg/dL (ref 0.44–1.00)
GFR, Estimated: >60 mL/min (ref >60)
Glucose, Bld: 307 mg/dL — ABNORMAL HIGH (ref 70–99)
Potassium: 4.3 mmol/L (ref 3.5–5.1)
Sodium: 135 mmol/L (ref 135–145)
Total Bilirubin: 0.5 mg/dL (ref 0.3–1.2)
Total Protein: 6.4 g/dL — ABNORMAL LOW (ref 6.5–8.1)

## 2022-09-27 LAB — CBC WITH DIFFERENTIAL (CANCER CENTER ONLY)
Abs Immature Granulocytes: 0.02 10*3/uL (ref 0.00–0.07)
Basophils Absolute: 0 10*3/uL (ref 0.0–0.1)
Basophils Relative: 0 %
Eosinophils Absolute: 0.2 10*3/uL (ref 0.0–0.5)
Eosinophils Relative: 3 %
HCT: 38.4 % (ref 36.0–46.0)
Hemoglobin: 13.6 g/dL (ref 12.0–15.0)
Immature Granulocytes: 0 %
Lymphocytes Relative: 11 %
Lymphs Abs: 0.8 10*3/uL (ref 0.7–4.0)
MCH: 28 pg (ref 26.0–34.0)
MCHC: 35.4 g/dL (ref 30.0–36.0)
MCV: 79.2 fL — ABNORMAL LOW (ref 80.0–100.0)
Monocytes Absolute: 0.7 10*3/uL (ref 0.1–1.0)
Monocytes Relative: 10 %
Neutro Abs: 5.4 10*3/uL (ref 1.7–7.7)
Neutrophils Relative %: 76 %
Platelet Count: 140 10*3/uL — ABNORMAL LOW (ref 150–400)
RBC: 4.85 MIL/uL (ref 3.87–5.11)
RDW: 15.9 % — ABNORMAL HIGH (ref 11.5–15.5)
WBC Count: 7.2 10*3/uL (ref 4.0–10.5)
nRBC: 0 % (ref 0.0–0.2)

## 2022-09-27 MED ORDER — TEMOZOLOMIDE 100 MG PO CAPS
300.0000 mg | ORAL_CAPSULE | Freq: Every day | ORAL | 0 refills | Status: DC
  Filled 2022-09-27: qty 15, 28d supply, fill #0

## 2022-09-27 NOTE — Progress Notes (Signed)
Requested MRI Brain done at [facility] on 09/26/2022

## 2022-09-27 NOTE — Progress Notes (Signed)
Oral Oncology Pharmacist Encounter  Confirmed with Dr. Mickeal Skinner patient is decreasing temozolomide to 300 mg daily for 5 days on, 23 off, repeated every 28 days this cycle. Prescription updated and resent to [facility] for dispensing.   Leron Croak, PharmD, BCPS, [facility] Hematology/Oncology Clinical Pharmacist [name] and [place] [phone] 09/27/2022 1:54 PM

## 2022-09-27 NOTE — Progress Notes (Signed)
[facility] [place], [place] [address] [phone]   Interval Evaluation  Date of Service: 09/27/22 Patient Name: Yvonne [name] Patient MRN: [id number] Patient DOB: 06/15/1946 Provider: Ventura Sellers, MD  Identifying Statement:  Yvonne [name] is a 77 y.o. female with left temporal glioblastoma    Oncologic History: Oncology History  Glioblastoma, IDH-wildtype ([place])  04/18/2022 Surgery   Left temporal craniotomy, resection with Dr. Tommi Rumps at [facility].  Path is glioblastoma, IDHwt   05/16/2022 -  Chemotherapy   [name] : BRAIN GLIOBLASTOMA Radiation Therapy With Concurrent Temozolomide 75 mg/m2 Daily Followed By Sequential Maintenance Temozolomide x 6-12 cycles       Biomarkers:  MGMT Unknown.  IDH 1/2 Wild type.  EGFR Unknown  TERT Unknown   Interval History: Yvonne [name] presents today for follow up, now having completed cycle #2 of adjuvant Temodar.  Tolerated treatment well overall.  Had good visit with the [facility] team yesterday.  She and her daughter describe no significant changes with regards to energy, cognition, speech.  Issues with short term memory, irritable behavior are stable.  No further seizures, no headaches.    H+P (05/03/22) Patient presented to medical attention in August 2023 with episode of sudden onset speech impairment.  Initially treated as stroke with TPA, CNS imaging later demonstrated enhancing mass in the left temporal lobe, c/w primary brain tumor.  She underwent resection at [facility] with Dr. Tommi Rumps after second opinion there.  Following surgery she experienced some speech impairment; this has improved modestly, but she still experiences difficulty with finding correct words.  Also struggling with short term memory.  She is currently living with her daughter given cognitive limitations, need for some supervision.  No further seizures with the Keppra 545m twice per day.   Currently on decadron 256mdaily per discharge taper from [place]   Medications: Current Outpatient Medications on File Prior to Visit  Medication Sig Dispense Refill   acetaminophen (TYLENOL) 500 MG tablet Take 1,000 mg by mouth every 8 (eight) hours as needed for moderate pain.     Cholecalciferol (VITAMIN D) 50 MCG (2000 UT) tablet Take 2,000 Units by mouth daily.      fluticasone (FLONASE) 50 MCG/ACT nasal spray Place 2 sprays into both nostrils at bedtime.     furosemide (LASIX) 20 MG tablet Take 10 mg by mouth as needed for edema.     gabapentin (NEURONTIN) 300 MG capsule Take 300 mg by mouth at bedtime.      lacosamide (VIMPAT) 50 MG TABS tablet Take 1 tablet (50 mg total) by mouth 2 (two) times daily. 60 tablet 3   levothyroxine (SYNTHROID) 88 MCG tablet Take 88 mcg by mouth daily before breakfast.     metFORMIN (GLUCOPHAGE) 500 MG tablet Take 500 mg by mouth daily.     metoprolol succinate (TOPROL-XL) 100 MG 24 hr tablet Take 100 mg by mouth daily.     metroNIDAZOLE (METROCREAM) 0.75 % cream Apply 1 application  topically daily.     ondansetron (ZOFRAN) 8 MG tablet Take 1 tablet (8 mg total) by mouth every 8 (eight) hours as needed for nausea or vomiting. May take 30-60 minutes prior to Temodar administration if nausea/vomiting occurs as needed. 30 tablet 1   prochlorperazine (COMPAZINE) 10 MG tablet Take 1 tablet (10 mg total) by mouth every 6 (six) hours as needed for nausea or vomiting. 30 tablet 0  rosuvastatin (CRESTOR) 5 MG tablet Take 5 mg by mouth daily.     Current Facility-Administered Medications on File Prior to Visit  Medication Dose Route Frequency Provider Last Rate Last Admin   influenza vaccine adjuvanted (FLUAD) injection 0.5 mL  0.5 mL Intramuscular Once Dandra Shambaugh, Acey Lav, MD        Allergies:  Allergies  Allergen Reactions   Tobramycin Hives   Sulfa Antibiotics     Stomach upset   Erythromycin Nausea Only   Past Medical History:  Past Medical History:   Diagnosis Date   Anxiety    Chest pressure    Degenerative arthritis    Deviated septum    Diabetes mellitus without complication (HCC)    diet controlled   Diabetic neuropathy ([place]) 12/03/2017   DJD (degenerative joint disease) of knee    Family history of bladder cancer    Family history of brain cancer    Family history of breast cancer    Family history of ovarian cancer    Family history of prostate cancer    Family history of stomach cancer    Family history of throat cancer    Family history of uterine cancer    Graves disease    RADIOACTIVE IODINE 1983   H/O hiatal hernia    Heart palpitations    History of diastolic dysfunction    per echo in 2011   Hyperlipidemia    Hypertension    Hypothyroidism    LVH (left ventricular hypertrophy)    per echo in 2011   Mitral valve prolapse    Neuropathy, peripheral    DR. Jannifer Franklin 12/15   Normal nuclear stress test 2011   OSA (obstructive sleep apnea)    Polyneuropathy in other diseases classified elsewhere ([place]) 05/21/2013   Rosacea 2019   MELANOMA CHEST REMOVED IN SITU   Sleep difficulties    Tinnitus    Past Surgical History:  Past Surgical History:  Procedure Laterality Date   ABDOMINAL HYSTERECTOMY  2000   TOTAL   APPENDECTOMY  1989   CARDIOVASCULAR STRESS TEST  11/09/2009   EF 75%   CARPAL TUNNEL RELEASE  2002   bilateral   COLONOSCOPY WITH PROPOFOL N/A 10/23/2018   Procedure: COLONOSCOPY WITH PROPOFOL;  Surgeon: [name], MD;  Location: [facility];  Service: Endoscopy;  Laterality: N/A;   ESOPHAGOGASTRODUODENOSCOPY N/A 11/16/2021   Procedure: ESOPHAGOGASTRODUODENOSCOPY (EGD);  Surgeon: [name], MD;  Location: [facility];  Service: Endoscopy;  Laterality: N/A;   ESOPHAGOGASTRODUODENOSCOPY (EGD) WITH PROPOFOL N/A 10/23/2018   Procedure: ESOPHAGOGASTRODUODENOSCOPY (EGD) WITH PROPOFOL;  Surgeon: [name], MD;  Location: [facility];  Service: Endoscopy;  Laterality: N/A;   EUS N/A  10/23/2018   Procedure: UPPER ENDOSCOPIC ULTRASOUND (EUS) RADIAL;  Surgeon: [name], MD;  Location: [facility];  Service: Endoscopy;  Laterality: N/A;   EUS N/A 11/16/2021   Procedure: UPPER ENDOSCOPIC ULTRASOUND (EUS) RADIAL;  Surgeon: [name], MD;  Location: [facility];  Service: Endoscopy;  Laterality: N/A;   MASS EXCISION Left 06/02/2019   Procedure: EXCISION OF SUBCUTANEOUS ABDOMINAL WALL MASSES LEFT LOWER QUADRANT;  Surgeon: [name], MD;  Location: [facility];  Service: General;  Laterality: Left;   TONSILLECTOMY  1958   TRANSTHORACIC ECHOCARDIOGRAM  11/01/2009   NORMAL LV FILLING AND DIASTOLIC DYSFUNCTION AND MILD AORTIC SCLEROSIS AND TRACE MITRAL REGURGITATION   Social History:  Social History   Socioeconomic History   Marital status: Married    Spouse name: Edd Fabian  Number of children: 1   Years of education: COLLEGE   Highest education level: Not on file  Occupational History   Occupation: Retired  Tobacco Use   Smoking status: Former    Packs/day: 0.50    Years: 10.00    Total pack years: 5.00    Types: Cigarettes    Quit date: 08/14/1975    Years since quitting: 47.1   Smokeless tobacco: Never  Vaping Use   Vaping Use: Never used  Substance and Sexual Activity   Alcohol use: Yes    Comment: one glass of wine a week   Drug use: No   Sexual activity: Not on file  Other Topics Concern   Not on file  Social History Narrative   Lives at home w/ her husband, Edd Fabian.   Right-handed.   Drinks no caffeine but does eat chocolate occasionally.   epworth sleepiness scale = 3 (01/10/16)   Social Determinants of Health   Financial Resource Strain: Medium Risk (06/15/2022)   Overall Financial Resource Strain (CARDIA)    Difficulty of Paying Living Expenses: Somewhat hard  Food Insecurity: No Food Insecurity (06/15/2022)   Hunger Vital Sign    Worried About Running Out of Food in the Last Year: Never true    Ran Out of Food in the Last Year: Never true   Transportation Needs: No Transportation Needs (06/15/2022)   PRAPARE - Hydrologist (Medical): No    Lack of Transportation (Non-Medical): No  Physical Activity: Not on file  Stress: Not on file  Social Connections: Not on file  Intimate Partner Violence: Not At Risk (06/15/2022)   Humiliation, Afraid, Rape, and Kick questionnaire    Fear of Current or Ex-Partner: No    Emotionally Abused: No    Physically Abused: No    Sexually Abused: No   Family History:  Family History  Problem Relation Age of Onset   Hypertension Mother    Heart disease Mother    Dementia Mother    Heart disease Father    Arthritis Father    Diabetes Father    Hypertension Father    Pancreatic cancer Brother 46       pancreatic   Other Brother        automobile accident   Throat cancer Maternal Uncle    Brain cancer Paternal Aunt 63       malignant brain cancer   Stroke Maternal Grandmother    Heart disease Maternal Grandfather    Pancreatic cancer Cousin 43       pancreatic (maternal first cousin)   Pancreatic cancer Cousin 76       pancreatic, smoker (maternal first cousin)   Breast cancer Cousin 4       breast (maternal first cousin)   Ovarian cancer Cousin    Uterine cancer Cousin 53       uterine (paternal first cousin)   Cancer Cousin 26       unknown primary (paternal first cousin)   Bladder Cancer Cousin 12       (paternal first cousin)   Lung cancer Cousin    Throat cancer Cousin    Prostate cancer Cousin        (maternal first cousin)   Stomach cancer Cousin 5       (maternal first cousin)   Cancer Other 24       pancreatic neuroendocrine carcinoma   Colon cancer Neg Hx    Neuropathy Neg Hx  Review of Systems: Constitutional: Doesn't report fevers, chills or abnormal weight loss Eyes: Doesn't report blurriness of vision Ears, nose, mouth, throat, and face: Doesn't report sore throat Respiratory: Doesn't report cough, dyspnea or  wheezes Cardiovascular: Doesn't report palpitation, chest discomfort  Gastrointestinal:  Doesn't report nausea, constipation, diarrhea GU: Doesn't report incontinence Skin: Doesn't report skin rashes Neurological: Per HPI Musculoskeletal: Doesn't report joint pain Behavioral/Psych: Doesn't report anxiety  Physical Exam: Vitals:   09/27/22 1115  BP: (!) 152/74  Pulse: 69  Resp: 18  Temp: 97.7 F (36.5 C)  SpO2: 98%   KPS: 70. General: Alert, cooperative, pleasant, in no acute distress Head: Normal EENT: No conjunctival injection or scleral icterus.  Lungs: Resp effort normal Cardiac: Regular rate Abdomen: Non-distended abdomen Skin: No rashes cyanosis or petechiae. Extremities: No clubbing or edema  Neurologic Exam: Mental Status: Awake, alert, attentive to examiner. Oriented to self and environment. Language is fluent but comprehension is markedly impaired.  Anterograde amnesia is noted, mild neglect, elements of agnosia. Cranial Nerves: Visual acuity is grossly normal. Visual fields are full. Extra-ocular movements intact. No ptosis. Face is symmetric Motor: Tone and bulk are normal. Power is full in both arms and legs. Reflexes are symmetric, no pathologic reflexes present.  Sensory: Intact to light touch Gait: Normal.   Labs: I have reviewed the data as listed    Component Value Date/Time   NA 134 (L) 08/28/2022 1335   K 4.1 08/28/2022 1335   CL 99 08/28/2022 1335   CO2 27 08/28/2022 1335   GLUCOSE 241 (H) 08/28/2022 1335   BUN 20 08/28/2022 1335   CREATININE 0.66 08/28/2022 1335   CREATININE 0.71 01/04/2016 0739   CALCIUM 9.7 08/28/2022 1335   PROT 6.6 08/28/2022 1335   PROT 6.7 10/03/2021 0918   ALBUMIN 4.1 08/28/2022 1335   AST 14 (L) 08/28/2022 1335   ALT 20 08/28/2022 1335   ALKPHOS 52 08/28/2022 1335   BILITOT 0.4 08/28/2022 1335   GFRNONAA >60 08/28/2022 1335   GFRAA >60 10/23/2019 1004   Lab Results  Component Value Date   WBC 7.2 09/27/2022    NEUTROABS 5.4 09/27/2022   HGB 13.6 09/27/2022   HCT 38.4 09/27/2022   MCV 79.2 (L) 09/27/2022   PLT 140 (L) 09/27/2022    Assessment/Plan Glioblastoma, IDH-wildtype ([place])  Yvonne [name] is clinically stable today, now s/p cycle #2 5-day Temodar. MRI at [facility] demonstrated stable findings.  We recommended initiating treatment with cycle #3 Temozolomide 200 mg/m2, on for five days and off for twenty three days in twenty eight day cycles. The patient will have a complete blood count performed on days 21 and 28 of each cycle, and a comprehensive metabolic panel performed on day 28 of each cycle. Labs may need to be performed more often. Zofran will prescribed for home use for nausea/vomiting.   Chemotherapy should be held for the following:  ANC less than 1,000  Platelets less than 100,000  LFT or creatinine greater than 2x ULN  If clinical concerns/contraindications develop  Will con't Vimpat 54m BID for seizures.  We ask that Yvonne POSTLEreturn to clinic in 1 months prior to cycle #4 with labs for evaluation, or sooner as needed.  All questions were answered. The patient knows to call the clinic with any problems, questions or concerns. No barriers to learning were detected.  The total time spent in the encounter was 30 minutes and more than 50% was on counseling and review of test results  Ventura Sellers, MD Medical Director of Neuro-Oncology [facility] at [place] 09/27/22 11:32 AM

## 2022-09-28 ENCOUNTER — Telehealth: Payer: Self-pay | Admitting: Internal Medicine

## 2022-09-28 DIAGNOSIS — E785 Hyperlipidemia, unspecified: Secondary | ICD-10-CM | POA: Diagnosis not present

## 2022-09-28 DIAGNOSIS — E119 Type 2 diabetes mellitus without complications: Secondary | ICD-10-CM | POA: Diagnosis not present

## 2022-09-28 NOTE — Telephone Encounter (Signed)
Scheduled per 02/15 los, patient has been called and notified of upcoming appointments. ?

## 2022-10-02 ENCOUNTER — Ambulatory Visit: Payer: Medicare PPO

## 2022-10-02 DIAGNOSIS — Z79630 Long term (current) use of alkylating agent: Secondary | ICD-10-CM | POA: Diagnosis not present

## 2022-10-02 DIAGNOSIS — D84821 Immunodeficiency due to drugs: Secondary | ICD-10-CM | POA: Diagnosis not present

## 2022-10-02 DIAGNOSIS — E119 Type 2 diabetes mellitus without complications: Secondary | ICD-10-CM | POA: Diagnosis not present

## 2022-10-02 DIAGNOSIS — E1165 Type 2 diabetes mellitus with hyperglycemia: Secondary | ICD-10-CM | POA: Diagnosis not present

## 2022-10-02 DIAGNOSIS — E785 Hyperlipidemia, unspecified: Secondary | ICD-10-CM | POA: Diagnosis not present

## 2022-10-02 DIAGNOSIS — E1140 Type 2 diabetes mellitus with diabetic neuropathy, unspecified: Secondary | ICD-10-CM | POA: Diagnosis not present

## 2022-10-02 DIAGNOSIS — I10 Essential (primary) hypertension: Secondary | ICD-10-CM | POA: Diagnosis not present

## 2022-10-02 DIAGNOSIS — C719 Malignant neoplasm of brain, unspecified: Secondary | ICD-10-CM | POA: Diagnosis not present

## 2022-10-02 DIAGNOSIS — E039 Hypothyroidism, unspecified: Secondary | ICD-10-CM | POA: Diagnosis not present

## 2022-10-02 DIAGNOSIS — L600 Ingrowing nail: Secondary | ICD-10-CM | POA: Diagnosis not present

## 2022-10-04 ENCOUNTER — Ambulatory Visit: Payer: Medicare PPO

## 2022-10-04 ENCOUNTER — Other Ambulatory Visit (HOSPITAL_COMMUNITY): Payer: Self-pay

## 2022-10-04 DIAGNOSIS — R41841 Cognitive communication deficit: Secondary | ICD-10-CM | POA: Diagnosis not present

## 2022-10-04 DIAGNOSIS — R4701 Aphasia: Secondary | ICD-10-CM

## 2022-10-04 NOTE — Therapy (Signed)
OUTPATIENT SPEECH LANGUAGE PATHOLOGY TREATMENT   Patient Name: Yvonne [name] MRN: [id number] DOB:1945-12-12, 77 y.o., female Today's Date: 10/04/2022  PCP: Kathyrn Lass, MD REFERRING PROVIDER: Jimmy Footman, NP  END OF SESSION:  End of Session - 10/04/22 0806     Visit Number 9    Number of Visits 25    Date for SLP Re-Evaluation 11/16/22    SLP Start Time 0804    SLP Stop Time  0845    SLP Time Calculation (min) 41 min    Activity Tolerance Patient tolerated treatment well                 Past Medical History:  Diagnosis Date   Anxiety    Chest pressure    Degenerative arthritis    Deviated septum    Diabetes mellitus without complication ([place])    diet controlled   Diabetic neuropathy ([place]) 12/03/2017   DJD (degenerative joint disease) of knee    Family history of bladder cancer    Family history of brain cancer    Family history of breast cancer    Family history of ovarian cancer    Family history of prostate cancer    Family history of stomach cancer    Family history of throat cancer    Family history of uterine cancer    Graves disease    RADIOACTIVE IODINE 1983   H/O hiatal hernia    Heart palpitations    History of diastolic dysfunction    per echo in 2011   Hyperlipidemia    Hypertension    Hypothyroidism    LVH (left ventricular hypertrophy)    per echo in 2011   Mitral valve prolapse    Neuropathy, peripheral    DR. Jannifer Franklin 12/15   Normal nuclear stress test 2011   OSA (obstructive sleep apnea)    Polyneuropathy in other diseases classified elsewhere ([place]) 05/21/2013   Rosacea 2019   MELANOMA CHEST REMOVED IN SITU   Sleep difficulties    Tinnitus    Past Surgical History:  Procedure Laterality Date   ABDOMINAL HYSTERECTOMY  2000   TOTAL   APPENDECTOMY  1989   CARDIOVASCULAR STRESS TEST  11/09/2009   EF 75%   CARPAL TUNNEL RELEASE  2002   bilateral   COLONOSCOPY WITH PROPOFOL N/A 10/23/2018   Procedure: COLONOSCOPY  WITH PROPOFOL;  Surgeon: [name], MD;  Location: [facility];  Service: Endoscopy;  Laterality: N/A;   ESOPHAGOGASTRODUODENOSCOPY N/A 11/16/2021   Procedure: ESOPHAGOGASTRODUODENOSCOPY (EGD);  Surgeon: [name], MD;  Location: [facility];  Service: Endoscopy;  Laterality: N/A;   ESOPHAGOGASTRODUODENOSCOPY (EGD) WITH PROPOFOL N/A 10/23/2018   Procedure: ESOPHAGOGASTRODUODENOSCOPY (EGD) WITH PROPOFOL;  Surgeon: [name], MD;  Location: [facility];  Service: Endoscopy;  Laterality: N/A;   EUS N/A 10/23/2018   Procedure: UPPER ENDOSCOPIC ULTRASOUND (EUS) RADIAL;  Surgeon: [name], MD;  Location: [facility];  Service: Endoscopy;  Laterality: N/A;   EUS N/A 11/16/2021   Procedure: UPPER ENDOSCOPIC ULTRASOUND (EUS) RADIAL;  Surgeon: [name], MD;  Location: [facility];  Service: Endoscopy;  Laterality: N/A;   MASS EXCISION Left 06/02/2019   Procedure: EXCISION OF SUBCUTANEOUS ABDOMINAL WALL MASSES LEFT LOWER QUADRANT;  Surgeon: [name], MD;  Location: [facility];  Service: General;  Laterality: Left;   TONSILLECTOMY  1958   TRANSTHORACIC ECHOCARDIOGRAM  11/01/2009   NORMAL LV FILLING AND DIASTOLIC DYSFUNCTION AND MILD AORTIC SCLEROSIS AND TRACE MITRAL REGURGITATION  Patient Active Problem List   Diagnosis Date Noted   Glioblastoma of temporal lobe ([place]) 08/17/2022   Chronic serous otitis media of left ear 06/26/2022   Eustachian tube dysfunction, left 06/26/2022   Glioblastoma, IDH-wildtype ([place]) 05/03/2022   Brain lesion 04/10/2022   Acute ischemic stroke ([place]) 04/04/2022   Osteoarthritis of left knee 11/24/2021   Osteoarthritis of right knee 11/24/2021   Excessive cerumen in ear canal, right 05/29/2021   Sensorineural hearing loss (SNHL), bilateral 05/29/2021   Family history of brain cancer    Family history of throat cancer    Family history of breast cancer    Family history of ovarian cancer    Family history of prostate cancer    Family  history of stomach cancer    Family history of bladder cancer    Family history of uterine cancer    Anxiety 10/27/2020   Daytime somnolence 10/27/2020   Diabetic peripheral neuropathy associated with type 2 diabetes mellitus ([place]) 10/27/2020   Excessive sleepiness 10/27/2020   Hypertension 10/27/2020   Obstructive sleep apnea syndrome 10/27/2020   Peripheral neuropathy 10/27/2020   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 10/27/2020   Rosacea 10/27/2020   Vitamin D deficiency 10/27/2020   Deviated septum 05/09/2020   Trochanteric bursitis of left hip 12/30/2019   Special screening for malignant neoplasms, colon    Diverticulosis of colon without hemorrhage    Family history of pancreatic cancer    Pancreatic cyst    Osteoarthritis of knee 12/04/2017   Diabetic neuropathy ([place]) 12/03/2017   Newly diagnosed diabetes ([place]) 06/20/2017   Bilateral hearing loss 05/28/2017   Bilateral impacted cerumen 05/28/2017   Seasonal allergic rhinitis 05/28/2017   Tinnitus, bilateral 05/28/2017   Genetic testing 10/15/2016   DM2 (diabetes mellitus, type 2) ([place]) 01/10/2016   Polyneuropathy in other diseases classified elsewhere ([place]) 05/21/2013   History of hyperglycemia 04/01/2012   Atypical chest pain 12/28/2011   Benign hypertensive heart disease without heart failure 02/05/2011   Hyperlipidemia 02/05/2011   Hypothyroidism 02/05/2011   Palpitations 02/05/2011    ONSET DATE: script 07/26/22   REFERRING DIAG: C71.9 (ICD-10-CM) - Glioblastoma, IDH-wildtype ([place])  THERAPY DIAG:  Aphasia  Cognitive communication deficit  Rationale for Evaluation and Treatment: Rehabilitation  SUBJECTIVE:   SUBJECTIVE STATEMENT: "You can tell he's heavy. He's the one in charge there but he's nice."  Pt accompanied by: self  PERTINENT HISTORY:  04/18/2022: Surgery-Left temporal craniotomy, resection with Dr. Tommi Rumps at [facility]. Path is glioblastoma. 05/16/2022- Radiation Therapy With  Concurrent Temozolomide 75 mg/m2 Daily Followed By Sequential Maintenance Temozolomide x 6-12 cycles. Currently having memory issues and referred by [name], NP. Pt with expressive aphasia since sx in September.  PAIN:  Are you having pain? No   PATIENT GOALS: Pt indicated she wants to improve her speaking.  OBJECTIVE:   DIAGNOSTIC FINDINGS:  1. 3 cm region of masslike signal abnormality in the left temporal lobe with small foci of enhancement, most concerning for primary CNS neoplasm. Demyelinating disease, vasculitis, and infection are less likely considerations. 2. No acute infarct.  STANDARDIZED ASSESSMENTS: QAB: Moderate Aphasia QAB Summary      Word comprehension 6.67  Sentence comprehension 3.33  Word finding 3.00  Grammatical construction 7.50  Speech motor programming 10.00  Repetition 8.33  Reading 10.00  QAB overall ======================================== SCORING (QAB Overall) 0.00-4.99= severe 5.00-7.49= moderate 7.50-8.89= mild 8.90-10.0= no aphasia 6.22      PATIENT REPORTED OUTCOME MEASURES (PROM): Communication Effectiveness Survey: to  be completed in first few therapy sessions. Score may be depressed due to decr'd awareness of linguistic errors. SLP to have pt and daughter fill out separate forms.   TODAY'S TREATMENT:                                                                                                                                         DATE:  10/04/22: Pt requires PROM (CES) next session. Pt communicated that she needed to work on her taxes this afternoon, functionally. During the session, SLP targeted words pt could not generate (spoon, taxes, flakes ("corn flakes")) and had her write these words down throughout the session, say them and write and copy sentences including these words. SLP had to provide min-mod A for written sentence generation. Pt has not begun using labels and SLP strongly encouraged her to do  this.  09/25/22: Pt tearful about MRI and report tomorrow at [facility]. SLP provided verbal support. PT had written 50% of the content of her frig on the paper she was given last session, along with the items in her freezer she would eat. SLP explained in multiple ways including demonstration that pt would benefit from having labels in her home for kitchen items, and explained rationale with gesture and repetition to ensure pt comprehension. Pt to make labels (with assistance PRN)  09/21/22: Pt brought back her personally relevant word list with some aphasic answers written on it. SLP assisted pt correct these responses and then practiced stores and educated pt about using her phone map app as another way to compensate for store/location. Pt req'd mod A usually for correcting the words.   09/18/22: SLP reviewed pt's homework with her and SLP encouraged pt to keep "talking simply" at this time. Pt and SLP worked today with beginning a personally relevant word list to focus her practice time. Pt able to name 2 things without cues (extra time necessary), and pt also req'd initial cue for using her phone to compensate but was successful using Google search to find a restaurant name. Spontaneous written cues were somewhat successful in pt's ability to generate target word.  09/12/22: SLP assisted pt in reviewing her homework and had 89% success with simple 3 word yes/no questions and simple yes no questions. She req'd mod-max A with all erroneous responses. SLP noted today that pt has more success when able to write a portion of the word, or to a lesser degree, see the word. SLP told family with pt here today (son in law) that family should attempt to have pt write the word when she has difficulty with anomia. Pt named two granddaughters' names independently.  09/10/22: Pt  with ability to have functional general conversation upon entry to [place] room. In conversation of depth, pt with much empty speech "stuff", "thing"  prevalent, to decr effectiveness of communication.  SLP reviewed pt's homework with her - error awareness  better today than at time of completion; Pt id'd 80% of errors with associated written words to a written target word. PAnswered random yes/no questions with 80% success and mod-max cues needed usually for incr'd comprehension. Pt was unaware of her errors when reading (0/3). SLP modeled how to cue pt and how to enhance the task in order to maximize neuroplasticity - having pt generate a semantically related word, and then write the word next to the target word.  08/29/22: SLP worked with pt on responsive naming with simple cues. Consistent max/total cues necessary. Phonemic cues were not helpful whatsoever. Written letter cues were most helpful and used as last resort for cueing. 4/11 times pt said the correct response but did not realize she had said the correct response.  SLP suggested blanks be written for pt if she could not generate the target word, however large blanks ("write in the answer" type blanks) were written and not a blank for each letter. SLP demonstrated for husband type and level of cues for him to assist pt at home.   08/22/22: SLP assisted pt with homework blanks. Max cues necessary - phonemic, semantic, and visual cues were not helpful. Letter-fill-ins were necessary for pt to have any success at simple naming given a clue. SLP believes pt knew the object described due to her attempts (67% successful) at description strategy. In 2/6 words, pt stated the correct answer but did not react like she had said the correct word. SLP told daughter to use letter fill ins if pt was unable to generate the word on future homework. Pt highly motivated, but SLP concerned pt's anoimic deficits are very dense.  08/20/22: SLP reviewed results of evaluation and explained the rationale for x2/week ST. Pt appeared reticent about frequency and duration of ST. SLP provided pt homework and explained to  daughter Suanne Marker in restroom) that she should observe pt's written responses and if pt is not providing correct answers someone will need to complete WITH the patient.  Due to pt's reduced awareness of errors, SLP may need to record pt and use this in a subsequent session to incr pt's awareness of her error rate/severity.  PATIENT EDUCATION: Education details: see "today's treatment" Person educated: Patient and Child(ren) Education method: Explanation, Demonstration, and Handouts Education comprehension: verbalized understanding, verbal cues required, and needs further education   GOALS: Goals reviewed with patient? Yes, generally  SHORT TERM GOALS: Target date: 09/28/22  Pt will functionally name 3 items in a common category within one minute using compensations PRN, in three sessions  Baseline: Goal status: Not met  2.  Pt will demo 70% error awareness in 5 minutes simple conversation, given min nonverbal cue in 3 sessions Baseline:  Goal status: Not met  3.  Pt will produce functional sentence responses 70% success given 2 attempts, in 3 sessions Baseline:  Goal status: not met  4.  Pt will functionally describe 3 expressive language compensations in 2 sessions  Baseline:  Goal status: not met  5.  Pt will demo understanding of 5 minutes simple conversation with two repeats requested by pt, in 3 sessions  Baseline:10/04/22  Goal status: partially met  LONG TERM GOALS: Target date: 11/16/22  Pt will functionally participate in 5 minutes simple conversation in 3 sessions  Baseline:  Goal status: Modified  2. Pt will demo 80% error awareness in 7 minutes simple conversation, given min nonverbal cue in 3 sessions Baseline:  Goal status: Modified  3.  Pt will functionally participate  in 10 minutes simple conversation with compensations PRN, in 3 sessions  Baseline:  Goal status: Modified  4.  Pt will demo 80% error awareness in 10 minutes simple conversation, given min  nonverbal cue in 3 sessions Baseline:  Goal status: Modified  5.  Pt will demo understanding of 10 minutes simple conversation in 3 sessions  Baseline:  Goal status: Modified  6.  Pt will generate higher/better score on PROM in last 1-2 sessions of ST, compared to initial score Baseline:  Goal status: Ongoing  ASSESSMENT:  CLINICAL IMPRESSION: LTGs downgraded - pt's aphasia is more dense than SLP originally thought. Yvonne [name] is a 77 y.o. female who was seen today for treatment of aphasia in light of tumor resection and is now post chemo/rad tx with ongoing regular chemotherapy. She exhibited expressive language deficits of anomia, and errors described as semantic paraphasias of which she was not always aware and complicated/hindered communication flow. SEE TX NOTE. Currently pt is managing meds, cooking, cleaning, and managing her home with cameras placed for [name] and family to ensure her safety. Pt with questionable cognitive deficits which are challenging to unequivocally confirm at this point due to her expressive and receptive aphasia. Assessment for cognitive-linguistics may be necessary later.   OBJECTIVE IMPAIRMENTS: include attention, awareness, expressive language, receptive language, and aphasia. These impairments are limiting patient from household responsibilities, ADLs/IADLs, and effectively communicating at home and in community. Factors affecting potential to achieve goals and functional outcome are severity of impairments.. Patient will benefit from skilled SLP services to address above impairments and improve overall function.  REHAB POTENTIAL: Good  PLAN:  SLP FREQUENCY: 2x/week  SLP DURATION: 12 weeks  PLANNED INTERVENTIONS: Language facilitation, Cognitive reorganization, Internal/external aids, Functional tasks, Multimodal communication approach, SLP instruction and feedback, Compensatory strategies, and Patient/family education    [facility], [place] 10/04/2022,  8:08 AM

## 2022-10-05 ENCOUNTER — Inpatient Hospital Stay: Payer: Medicare PPO

## 2022-10-05 ENCOUNTER — Telehealth: Payer: Self-pay

## 2022-10-05 NOTE — Progress Notes (Signed)
[place] CSW Progress Note  Clinical Education officer, museum returned daughter's call.  [name] is concerned about her mother having food to eat that is prepared.  Discussed resources.  CSW made a referral to Meals on Wheels through [place].  Securely emailed information on [employer].  Also informed Dory Peru, Dietician.  [name] to verify if patient has a transportation benefit through her [facility] plan.  Her mother wishes to remain as independent as possible.    Rodman Pickle Melicia Esqueda, LCSW

## 2022-10-05 NOTE — Telephone Encounter (Signed)
CSW attempted to contact patient's daughter, [name], per her request to discuss transportation and food resources.  Left vm.

## 2022-10-05 NOTE — Telephone Encounter (Signed)
This nurse received a call from this patients daughter, Yvonne [name].  She states that the patient had labs drawn with her primary care physician on yesterday 2/22 and she would like the results to be reviewed by the provider to see if the abnormal levels are related to her current treatment.  She also would like to know if the patient can have a nutrition consult can be submitted for the patient and if she can also receive a call from the patients social worker.  This nurse sent a message to the social worker to reach out to the daughter and other requests have been forwarded to the provider.  No further questions or concerns noted at this time.

## 2022-10-09 ENCOUNTER — Ambulatory Visit: Payer: Medicare PPO

## 2022-10-09 ENCOUNTER — Other Ambulatory Visit: Payer: Self-pay

## 2022-10-09 ENCOUNTER — Telehealth: Payer: Self-pay | Admitting: Dietician

## 2022-10-09 ENCOUNTER — Telehealth: Payer: Self-pay

## 2022-10-09 DIAGNOSIS — R4701 Aphasia: Secondary | ICD-10-CM | POA: Diagnosis not present

## 2022-10-09 DIAGNOSIS — R41841 Cognitive communication deficit: Secondary | ICD-10-CM | POA: Diagnosis not present

## 2022-10-09 NOTE — Telephone Encounter (Signed)
First attempt to reach daughter as requested by CSW. Provided my cell# on voice mail to return call for a nutrition consult on patient's behalf.  Also sent text.  April Manson, RDN, LDN Registered Dietitian, [place] Part Time Remote (Usual office hours: Tuesday-Thursday) Cell: [phone]

## 2022-10-09 NOTE — Therapy (Addendum)
OUTPATIENT SPEECH LANGUAGE PATHOLOGY TREATMENT/PROGRESSNOTE   Patient Name: Yvonne [name] MRN: [id number] DOB:Sep 30, 1945, 77 y.o., female Today's Date: 10/09/2022  PCP: Kathyrn Lass, MD REFERRING PROVIDER: Jimmy Footman, NP  END OF SESSION:  End of Session - 10/09/22 0851     Visit Number 10    Number of Visits 25    Date for SLP Re-Evaluation 11/16/22    SLP Start Time 0848    SLP Stop Time  0930    SLP Time Calculation (min) 42 min    Activity Tolerance Patient tolerated treatment well                 Past Medical History:  Diagnosis Date   Anxiety    Chest pressure    Degenerative arthritis    Deviated septum    Diabetes mellitus without complication ([place])    diet controlled   Diabetic neuropathy ([place]) 12/03/2017   DJD (degenerative joint disease) of knee    Family history of bladder cancer    Family history of brain cancer    Family history of breast cancer    Family history of ovarian cancer    Family history of prostate cancer    Family history of stomach cancer    Family history of throat cancer    Family history of uterine cancer    Graves disease    RADIOACTIVE IODINE 1983   H/O hiatal hernia    Heart palpitations    History of diastolic dysfunction    per echo in 2011   Hyperlipidemia    Hypertension    Hypothyroidism    LVH (left ventricular hypertrophy)    per echo in 2011   Mitral valve prolapse    Neuropathy, peripheral    DR. Jannifer Franklin 12/15   Normal nuclear stress test 2011   OSA (obstructive sleep apnea)    Polyneuropathy in other diseases classified elsewhere ([place]) 05/21/2013   Rosacea 2019   MELANOMA CHEST REMOVED IN SITU   Sleep difficulties    Tinnitus    Past Surgical History:  Procedure Laterality Date   ABDOMINAL HYSTERECTOMY  2000   TOTAL   APPENDECTOMY  1989   CARDIOVASCULAR STRESS TEST  11/09/2009   EF 75%   CARPAL TUNNEL RELEASE  2002   bilateral   COLONOSCOPY WITH PROPOFOL N/A 10/23/2018    Procedure: COLONOSCOPY WITH PROPOFOL;  Surgeon: [name], MD;  Location: [facility];  Service: Endoscopy;  Laterality: N/A;   ESOPHAGOGASTRODUODENOSCOPY N/A 11/16/2021   Procedure: ESOPHAGOGASTRODUODENOSCOPY (EGD);  Surgeon: [name], MD;  Location: [facility];  Service: Endoscopy;  Laterality: N/A;   ESOPHAGOGASTRODUODENOSCOPY (EGD) WITH PROPOFOL N/A 10/23/2018   Procedure: ESOPHAGOGASTRODUODENOSCOPY (EGD) WITH PROPOFOL;  Surgeon: [name], MD;  Location: [facility];  Service: Endoscopy;  Laterality: N/A;   EUS N/A 10/23/2018   Procedure: UPPER ENDOSCOPIC ULTRASOUND (EUS) RADIAL;  Surgeon: [name], MD;  Location: [facility];  Service: Endoscopy;  Laterality: N/A;   EUS N/A 11/16/2021   Procedure: UPPER ENDOSCOPIC ULTRASOUND (EUS) RADIAL;  Surgeon: [name], MD;  Location: [facility];  Service: Endoscopy;  Laterality: N/A;   MASS EXCISION Left 06/02/2019   Procedure: EXCISION OF SUBCUTANEOUS ABDOMINAL WALL MASSES LEFT LOWER QUADRANT;  Surgeon: [name], MD;  Location: [facility];  Service: General;  Laterality: Left;   TONSILLECTOMY  1958   TRANSTHORACIC ECHOCARDIOGRAM  11/01/2009   NORMAL LV FILLING AND DIASTOLIC DYSFUNCTION AND MILD AORTIC SCLEROSIS AND TRACE MITRAL REGURGITATION  Patient Active Problem List   Diagnosis Date Noted   Glioblastoma of temporal lobe ([place]) 08/17/2022   Chronic serous otitis media of left ear 06/26/2022   Eustachian tube dysfunction, left 06/26/2022   Glioblastoma, IDH-wildtype ([place]) 05/03/2022   Brain lesion 04/10/2022   Acute ischemic stroke ([place]) 04/04/2022   Osteoarthritis of left knee 11/24/2021   Osteoarthritis of right knee 11/24/2021   Excessive cerumen in ear canal, right 05/29/2021   Sensorineural hearing loss (SNHL), bilateral 05/29/2021   Family history of brain cancer    Family history of throat cancer    Family history of breast cancer    Family history of ovarian cancer    Family history of  prostate cancer    Family history of stomach cancer    Family history of bladder cancer    Family history of uterine cancer    Anxiety 10/27/2020   Daytime somnolence 10/27/2020   Diabetic peripheral neuropathy associated with type 2 diabetes mellitus ([place]) 10/27/2020   Excessive sleepiness 10/27/2020   Hypertension 10/27/2020   Obstructive sleep apnea syndrome 10/27/2020   Peripheral neuropathy 10/27/2020   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 10/27/2020   Rosacea 10/27/2020   Vitamin D deficiency 10/27/2020   Deviated septum 05/09/2020   Trochanteric bursitis of left hip 12/30/2019   Special screening for malignant neoplasms, colon    Diverticulosis of colon without hemorrhage    Family history of pancreatic cancer    Pancreatic cyst    Osteoarthritis of knee 12/04/2017   Diabetic neuropathy ([place]) 12/03/2017   Newly diagnosed diabetes ([place]) 06/20/2017   Bilateral hearing loss 05/28/2017   Bilateral impacted cerumen 05/28/2017   Seasonal allergic rhinitis 05/28/2017   Tinnitus, bilateral 05/28/2017   Genetic testing 10/15/2016   DM2 (diabetes mellitus, type 2) ([place]) 01/10/2016   Polyneuropathy in other diseases classified elsewhere ([place]) 05/21/2013   History of hyperglycemia 04/01/2012   Atypical chest pain 12/28/2011   Benign hypertensive heart disease without heart failure 02/05/2011   Hyperlipidemia 02/05/2011   Hypothyroidism 02/05/2011   Palpitations 02/05/2011   Speech Therapy Progress Note  Dates of Reporting Period: 08/20/22 to present  Subjective Statement: Pt has been seen for 10 sessions targeting aphasia.  Objective: See below  Goal Update: See below  Plan: Cont to see pt for ST.  Reason Skilled Services are Required: Pt has not maxed rehab potential  ONSET DATE: script 07/26/22   REFERRING DIAG: C71.9 (ICD-10-CM) - Glioblastoma, IDH-wildtype ([place])  THERAPY DIAG:  Aphasia  Cognitive communication deficit  Rationale for Evaluation  and Treatment: Rehabilitation  SUBJECTIVE:   SUBJECTIVE STATEMENT: "You can tell he's heavy. He's the one in charge there but he's nice."  Pt accompanied by: self  PERTINENT HISTORY:  04/18/2022: Surgery-Left temporal craniotomy, resection with Dr. Tommi Rumps [facility]. Path is glioblastoma. 05/16/2022- Radiation Therapy With Concurrent Temozolomide 75 mg/m2 Daily Followed By Sequential Maintenance Temozolomide x 6-12 cycles. Currently having memory issues and referred by [name], NP. Pt with expressive aphasia since sx in September.  PAIN:  Are you having pain? No   PATIENT GOALS: Pt indicated she wants to improve her speaking.  OBJECTIVE:   DIAGNOSTIC FINDINGS:  1. 3 cm region of masslike signal abnormality in the left temporal lobe with small foci of enhancement, most concerning for primary CNS neoplasm. Demyelinating disease, vasculitis, and infection are less likely considerations. 2. No acute infarct.  STANDARDIZED ASSESSMENTS: QAB: Moderate Aphasia QAB Summary      Word comprehension 6.67  Sentence comprehension 3.33  Word finding 3.00  Grammatical construction 7.50  Speech motor programming 10.00  Repetition 8.33  Reading 10.00  QAB overall ======================================== SCORING (QAB Overall) 0.00-4.99= severe 5.00-7.49= moderate 7.50-8.89= mild 8.90-10.0= no aphasia 6.22      PATIENT REPORTED OUTCOME MEASURES (PROM): Communication Effectiveness Survey: to be completed in first few therapy sessions. Score may be depressed due to decr'd awareness of linguistic errors. SLP to have pt and daughter fill out separate forms.   TODAY'S TREATMENT:                                                                                                                                         DATE:  10/09/22: PROM - CES filled out by [name] (dtr) scored 18/32 (higher scores indicate more communication effectiveness). Pt to return her form next session.  Today SLP strongly urged pt to make some compensations at home to foster verbal expression of common items, which she has not completed yet. After having pt write her favorite restaurants she was able to functionally approximate the names later in the session x2. SLP used this as an example for pt and daughter to use labels and written practice at home - SLP suggested pt have a small notebook and pencil to enhance verbal communication. X2 instances today of correct verbal expression without pt awareness.  10/04/22: Pt requires PROM (CES) next session. Pt communicated that she needed to work on her taxes this afternoon, functionally. During the session, SLP targeted words pt could not generate (spoon, taxes, flakes ("corn flakes")) and had her write these words down throughout the session, say them and write and copy sentences including these words. SLP had to provide min-mod A for written sentence generation. Pt has not begun using labels and SLP strongly encouraged her to do this.  09/25/22: Pt tearful about MRI and report tomorrow at [facility] SLP provided verbal support. PT had written 50% of the content of her frig on the paper she was given last session, along with the items in her freezer she would eat. SLP explained in multiple ways including demonstration that pt would benefit from having labels in her home for kitchen items, and explained rationale with gesture and repetition to ensure pt comprehension. Pt to make labels (with assistance PRN)  09/21/22: Pt brought back her personally relevant word list with some aphasic answers written on it. SLP assisted pt correct these responses and then practiced stores and educated pt about using her phone map app as another way to compensate for store/location. Pt req'd mod A usually for correcting the words.   09/18/22: SLP reviewed pt's homework with her and SLP encouraged pt to keep "talking simply" at this time. Pt and SLP worked today with beginning a personally  relevant word list to focus her practice time. Pt able to name 2 things without cues (extra time necessary), and pt also req'd  initial cue for using her phone to compensate but was successful using Google search to find a restaurant name. Spontaneous written cues were somewhat successful in pt's ability to generate target word.  09/12/22: SLP assisted pt in reviewing her homework and had 89% success with simple 3 word yes/no questions and simple yes no questions. She req'd mod-max A with all erroneous responses. SLP noted today that pt has more success when able to write a portion of the word, or to a lesser degree, see the word. SLP told family with pt here today (son in law) that family should attempt to have pt write the word when she has difficulty with anomia. Pt named two granddaughters' names independently.  09/10/22: Pt  with ability to have functional general conversation upon entry to [place] room. In conversation of depth, pt with much empty speech "stuff", "thing" prevalent, to decr effectiveness of communication.  SLP reviewed pt's homework with her - error awareness better today than at time of completion; Pt id'd 80% of errors with associated written words to a written target word. PAnswered random yes/no questions with 80% success and mod-max cues needed usually for incr'd comprehension. Pt was unaware of her errors when reading (0/3). SLP modeled how to cue pt and how to enhance the task in order to maximize neuroplasticity - having pt generate a semantically related word, and then write the word next to the target word.  08/29/22: SLP worked with pt on responsive naming with simple cues. Consistent max/total cues necessary. Phonemic cues were not helpful whatsoever. Written letter cues were most helpful and used as last resort for cueing. 4/11 times pt said the correct response but did not realize she had said the correct response.  SLP suggested blanks be written for pt if she could not generate  the target word, however large blanks ("write in the answer" type blanks) were written and not a blank for each letter. SLP demonstrated for husband type and level of cues for him to assist pt at home.    PATIENT EDUCATION: Education details: see "today's treatment" Person educated: Patient and Child(ren) Education method: Explanation, Demonstration, and Handouts Education comprehension: verbalized understanding, verbal cues required, and needs further education   GOALS: Goals reviewed with patient? Yes, generally  SHORT TERM GOALS: Target date: 09/28/22  Pt will functionally name 3 items in a common category within one minute using compensations PRN, in three sessions  Baseline: Goal status: Not met  2.  Pt will demo 70% error awareness in 5 minutes simple conversation, given min nonverbal cue in 3 sessions Baseline:  Goal status: Not met  3.  Pt will produce functional sentence responses 70% success given 2 attempts, in 3 sessions Baseline:  Goal status: not met  4.  Pt will functionally describe 3 expressive language compensations in 2 sessions  Baseline:  Goal status: not met  5.  Pt will demo understanding of 5 minutes simple conversation with two repeats requested by pt, in 3 sessions  Baseline:10/04/22  Goal status: partially met  LONG TERM GOALS: Target date: 11/16/22  Pt will functionally participate in 5 minutes simple conversation in 3 sessions  Baseline:  Goal status: Modified  2. Pt will demo 80% error awareness in 7 minutes simple conversation, given min nonverbal cue in 3 sessions Baseline:  Goal status: Modified  3.  Pt will functionally participate in 10 minutes simple conversation with compensations PRN, in 3 sessions  Baseline:  Goal status: Modified  4.  Pt will  demo 80% error awareness in 10 minutes simple conversation, given min nonverbal cue in 3 sessions Baseline:  Goal status: Modified  5.  Pt will demo understanding of 10 minutes simple  conversation in 3 sessions  Baseline:  Goal status: Modified  6.  Pt will generate higher/better score on PROM in last 1-2 sessions of ST, compared to initial score Baseline:  Goal status: Ongoing  ASSESSMENT:  CLINICAL IMPRESSION: Madeeha is a 77 y.o. female who was seen today for treatment of aphasia in light of tumor resection and is now post chemo/rad tx with ongoing regular chemotherapy. She cont to exhibit expressive language deficits of anomia, and errors described as semantic paraphasias of which she cont with decr'd awareness. SEE TX NOTE. Currently pt is managing meds, cooking, cleaning, and managing her home with cameras placed for [name] and family to ensure her safety. Pt with questionable cognitive deficits which are challenging to unequivocally confirm at this point due to her expressive and receptive aphasia. Assessment for cognitive-linguistics may be necessary later.   OBJECTIVE IMPAIRMENTS: include attention, awareness, expressive language, receptive language, and aphasia. These impairments are limiting patient from household responsibilities, ADLs/IADLs, and effectively communicating at home and in community. Factors affecting potential to achieve goals and functional outcome are severity of impairments.. Patient will benefit from skilled SLP services to address above impairments and improve overall function.  REHAB POTENTIAL: Good  PLAN:  SLP FREQUENCY: 2x/week  SLP DURATION: 12 weeks  PLANNED INTERVENTIONS: Language facilitation, Cognitive reorganization, Internal/external aids, Functional tasks, Multimodal communication approach, SLP instruction and feedback, Compensatory strategies, and Patient/family education    [facility], [place] 10/09/2022, 11:33 AM

## 2022-10-09 NOTE — Telephone Encounter (Signed)
Spoke with pt's daughter Yvonne [name] regarding pt's appt for Friday 10/12/2022.  Yvonne [name] stated does the pt still have to have labs drawn since labs were drawn on 09/27/2022.  Informed Yvonne [name] that this RN can cancel the lab appt since the labs were drawn on 09/27/2022.  Yvonne [name] also wanted to know if the appt for Friday was still needed since they were only monitoring the pancreatic lesion?  Informed Yvonne [name] that this RN will have to ask Dr. Burr Medico.  Yvonne [name] stated that the pt now has Glioblastoma which is being managed by Dr. Mickeal Skinner.  Informed Yvonne [name] to lets keep the Friday 10/12/2022 appt for now but this RN will call her back after this RN speaks with Dr. Burr Medico.  Yvonne [name] verbalized understanding and had no further questions or concerns.  Notified Dr. Burr Medico of [name]'s call.

## 2022-10-10 ENCOUNTER — Inpatient Hospital Stay: Payer: Medicare PPO

## 2022-10-10 ENCOUNTER — Telehealth: Payer: Self-pay

## 2022-10-10 NOTE — Telephone Encounter (Signed)
Spoke with pt's daughter [name] to inform her that Dr. Burr Medico is "OK" with cancelling the f/u appt that's scheduled on 10/12/2022 since the pt is primarily being seen by Dr. Mickeal Skinner for glioblastoma.

## 2022-10-11 ENCOUNTER — Telehealth: Payer: Self-pay | Admitting: Dietician

## 2022-10-11 NOTE — Telephone Encounter (Signed)
Second attempt to reach daughter as requested by CSW. Provided my cell# on voice mail to return call for a nutrition consult on patient's behalf.  Explained that I work Tuesday through Thursday and encouraged a return call today.  April Manson, RDN, LDN Registered Dietitian, [place] Part Time Remote (Usual office hours: Tuesday-Thursday) Cell: [phone]

## 2022-10-12 ENCOUNTER — Inpatient Hospital Stay: Payer: Medicare PPO | Admitting: Hematology

## 2022-10-16 ENCOUNTER — Ambulatory Visit: Payer: Medicare PPO | Attending: Nurse Practitioner

## 2022-10-16 DIAGNOSIS — R41841 Cognitive communication deficit: Secondary | ICD-10-CM | POA: Insufficient documentation

## 2022-10-16 DIAGNOSIS — R4701 Aphasia: Secondary | ICD-10-CM | POA: Insufficient documentation

## 2022-10-16 NOTE — Therapy (Addendum)
OUTPATIENT SPEECH LANGUAGE PATHOLOGY TREATMENT   Patient Name: Yvonne [name] MRN: [id number] DOB:[birth year], 77 y.o., female Today's Date: 10/16/2022  PCP: Kathyrn Lass, MD REFERRING PROVIDER: Jimmy Footman, NP  END OF SESSION:  End of Session - 10/16/22 2218     Visit Number 11    Number of Visits 25    Date for SLP Re-Evaluation 11/16/22    SLP Start Time 0850    SLP Stop Time  0930    SLP Time Calculation (min) 40 min    Activity Tolerance Patient tolerated treatment well                  Past Medical History:  Diagnosis Date   Anxiety    Chest pressure    Degenerative arthritis    Deviated septum    Diabetes mellitus without complication ([place])    diet controlled   Diabetic neuropathy ([place]) 12/03/2017   DJD (degenerative joint disease) of knee    Family history of bladder cancer    Family history of brain cancer    Family history of breast cancer    Family history of ovarian cancer    Family history of prostate cancer    Family history of stomach cancer    Family history of throat cancer    Family history of uterine cancer    Graves disease    RADIOACTIVE IODINE 1983   H/O hiatal hernia    Heart palpitations    History of diastolic dysfunction    per echo in 2011   Hyperlipidemia    Hypertension    Hypothyroidism    LVH (left ventricular hypertrophy)    per echo in 2011   Mitral valve prolapse    Neuropathy, peripheral    DR. Jannifer Franklin 12/15   Normal nuclear stress test 2011   OSA (obstructive sleep apnea)    Polyneuropathy in other diseases classified elsewhere ([place]) 05/21/2013   Rosacea 2019   MELANOMA CHEST REMOVED IN SITU   Sleep difficulties    Tinnitus    Past Surgical History:  Procedure Laterality Date   ABDOMINAL HYSTERECTOMY  2000   TOTAL   APPENDECTOMY  1989   CARDIOVASCULAR STRESS TEST  11/09/2009   EF 75%   CARPAL TUNNEL RELEASE  2002   bilateral   COLONOSCOPY WITH PROPOFOL N/A 10/23/2018   Procedure:  COLONOSCOPY WITH PROPOFOL;  Surgeon: [name], MD;  Location: [facility];  Service: Endoscopy;  Laterality: N/A;   ESOPHAGOGASTRODUODENOSCOPY N/A 11/16/2021   Procedure: ESOPHAGOGASTRODUODENOSCOPY (EGD);  Surgeon: [name], MD;  Location: [facility];  Service: Endoscopy;  Laterality: N/A;   ESOPHAGOGASTRODUODENOSCOPY (EGD) WITH PROPOFOL N/A 10/23/2018   Procedure: ESOPHAGOGASTRODUODENOSCOPY (EGD) WITH PROPOFOL;  Surgeon: [name], MD;  Location: [facility];  Service: Endoscopy;  Laterality: N/A;   EUS N/A 10/23/2018   Procedure: UPPER ENDOSCOPIC ULTRASOUND (EUS) RADIAL;  Surgeon: [name], MD;  Location: [facility];  Service: Endoscopy;  Laterality: N/A;   EUS N/A 11/16/2021   Procedure: UPPER ENDOSCOPIC ULTRASOUND (EUS) RADIAL;  Surgeon: [name], MD;  Location: [facility];  Service: Endoscopy;  Laterality: N/A;   MASS EXCISION Left 06/02/2019   Procedure: EXCISION OF SUBCUTANEOUS ABDOMINAL WALL MASSES LEFT LOWER QUADRANT;  Surgeon: [name], MD;  Location: [facility];  Service: General;  Laterality: Left;   TONSILLECTOMY  1958   TRANSTHORACIC ECHOCARDIOGRAM  11/01/2009   NORMAL LV FILLING AND DIASTOLIC DYSFUNCTION AND MILD AORTIC SCLEROSIS AND TRACE MITRAL REGURGITATION  Patient Active Problem List   Diagnosis Date Noted   Glioblastoma of temporal lobe ([place]) 08/17/2022   Chronic serous otitis media of left ear 06/26/2022   Eustachian tube dysfunction, left 06/26/2022   Glioblastoma, IDH-wildtype ([place]) 05/03/2022   Brain lesion 04/10/2022   Acute ischemic stroke ([place]) 04/04/2022   Osteoarthritis of left knee 11/24/2021   Osteoarthritis of right knee 11/24/2021   Excessive cerumen in ear canal, right 05/29/2021   Sensorineural hearing loss (SNHL), bilateral 05/29/2021   Family history of brain cancer    Family history of throat cancer    Family history of breast cancer    Family history of ovarian cancer    Family history of prostate cancer     Family history of stomach cancer    Family history of bladder cancer    Family history of uterine cancer    Anxiety 10/27/2020   Daytime somnolence 10/27/2020   Diabetic peripheral neuropathy associated with type 2 diabetes mellitus ([place]) 10/27/2020   Excessive sleepiness 10/27/2020   Hypertension 10/27/2020   Obstructive sleep apnea syndrome 10/27/2020   Peripheral neuropathy 10/27/2020   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 10/27/2020   Rosacea 10/27/2020   Vitamin D deficiency 10/27/2020   Deviated septum 05/09/2020   Trochanteric bursitis of left hip 12/30/2019   Special screening for malignant neoplasms, colon    Diverticulosis of colon without hemorrhage    Family history of pancreatic cancer    Pancreatic cyst    Osteoarthritis of knee 12/04/2017   Diabetic neuropathy ([place]) 12/03/2017   Newly diagnosed diabetes ([place]) 06/20/2017   Bilateral hearing loss 05/28/2017   Bilateral impacted cerumen 05/28/2017   Seasonal allergic rhinitis 05/28/2017   Tinnitus, bilateral 05/28/2017   Genetic testing 10/15/2016   DM2 (diabetes mellitus, type 2) ([place]) 01/10/2016   Polyneuropathy in other diseases classified elsewhere ([place]) 05/21/2013   History of hyperglycemia 04/01/2012   Atypical chest pain 12/28/2011   Benign hypertensive heart disease without heart failure 02/05/2011   Hyperlipidemia 02/05/2011   Hypothyroidism 02/05/2011   Palpitations 02/05/2011    ONSET DATE: script 07/26/22   REFERRING DIAG: C71.9 (ICD-10-CM) - Glioblastoma, IDH-wildtype ([place])  THERAPY DIAG:  Aphasia  Cognitive communication deficit  Rationale for Evaluation and Treatment: Rehabilitation  SUBJECTIVE:   SUBJECTIVE STATEMENT: "There's no sugar. It's a sad time." (Pt re: no sugar in her diet now)  Pt accompanied by: self  PERTINENT HISTORY:  04/18/2022: Surgery-Left temporal craniotomy, resection with Dr. Tommi Rumps at [facility]. Path is glioblastoma. 05/16/2022- Radiation Therapy With  Concurrent Temozolomide 75 mg/m2 Daily Followed By Sequential Maintenance Temozolomide x 6-12 cycles. Currently having memory issues and referred by [name], NP. Pt with expressive aphasia since sx in September.  PAIN:  Are you having pain? No   PATIENT GOALS: Pt indicated she wants to improve her speaking.  OBJECTIVE:   DIAGNOSTIC FINDINGS:  1. 3 cm region of masslike signal abnormality in the left temporal lobe with small foci of enhancement, most concerning for primary CNS neoplasm. Demyelinating disease, vasculitis, and infection are less likely considerations. 2. No acute infarct.  STANDARDIZED ASSESSMENTS: QAB: Moderate Aphasia QAB Summary      Word comprehension 6.67  Sentence comprehension 3.33  Word finding 3.00  Grammatical construction 7.50  Speech motor programming 10.00  Repetition 8.33  Reading 10.00  QAB overall ======================================== SCORING (QAB Overall) 0.00-4.99= severe 5.00-7.49= moderate 7.50-8.89= mild 8.90-10.0= no aphasia 6.22      PATIENT REPORTED OUTCOME MEASURES (PROM): Communication Effectiveness Survey:  pt responded with her CES today and scored 25/40 (with higher scores being less imparct on QOL). Pt scored herself 1/4 on "having a conversation with someone at a distance" and 2/4 with "participating in a conversation with strangers in quiet room" and, "conversing with a strnger over the phone".     TODAY'S TREATMENT:                                                                                                                                         DATE:  10/16/22: Pt emotionally labile during today's session x3, primarily when mentione d her diet. Pt brought in her folders with sticky notes for refrigerator, restaurants, stores. She used the folders in session with good success, reportedly. Pt was nearly functional in conversation today (req'd rare SLP cues for understanidng of pt's message). With  communication breakdown x5, pt encouraged to write anomic word - success 3/5.   10/09/22: PROM - CES filled out by [name] (dtr) scored 18/32 (higher scores indicate more communication effectiveness). Pt to return her form next session. Today SLP strongly urged pt to make some compensations at home to foster verbal expression of common items, which she has not completed yet. After having pt write her favorite restaurants she was able to functionally approximate the names later in the session x2. SLP used this as an example for pt and daughter to use labels and written practice at home - SLP suggested pt have a small notebook and pencil to enhance verbal communication. X2 instances today of correct verbal expression without pt awareness.  10/04/22: Pt requires PROM (CES) next session. Pt communicated that she needed to work on her taxes this afternoon, functionally. During the session, SLP targeted words pt could not generate (spoon, taxes, flakes ("corn flakes")) and had her write these words down throughout the session, say them and write and copy sentences including these words. SLP had to provide min-mod A for written sentence generation. Pt has not begun using labels and SLP strongly encouraged her to do this.  09/25/22: Pt tearful about MRI and report tomorrow at [facility]. SLP provided verbal support. PT had written 50% of the content of her frig on the paper she was given last session, along with the items in her freezer she would eat. SLP explained in multiple ways including demonstration that pt would benefit from having labels in her home for kitchen items, and explained rationale with gesture and repetition to ensure pt comprehension. Pt to make labels (with assistance PRN)  09/21/22: Pt brought back her personally relevant word list with some aphasic answers written on it. SLP assisted pt correct these responses and then practiced stores and educated pt about using her phone map app as another way to  compensate for store/location. Pt req'd mod A usually for correcting the words.   09/18/22: SLP reviewed pt's homework with her and SLP encouraged pt to  keep "talking simply" at this time. Pt and SLP worked today with beginning a personally relevant word list to focus her practice time. Pt able to name 2 things without cues (extra time necessary), and pt also req'd initial cue for using her phone to compensate but was successful using Google search to find a restaurant name. Spontaneous written cues were somewhat successful in pt's ability to generate target word.  09/12/22: SLP assisted pt in reviewing her homework and had 89% success with simple 3 word yes/no questions and simple yes no questions. She req'd mod-max A with all erroneous responses. SLP noted today that pt has more success when able to write a portion of the word, or to a lesser degree, see the word. SLP told family with pt here today (son in law) that family should attempt to have pt write the word when she has difficulty with anomia. Pt named two granddaughters' names independently.   PATIENT EDUCATION: Education details: see "today's treatment" Person educated: Patient and Child(ren) Education method: Explanation, Demonstration, and Handouts Education comprehension: verbalized understanding, verbal cues required, and needs further education   GOALS: Goals reviewed with patient? Yes, generally  SHORT TERM GOALS: Target date: 09/28/22  Pt will functionally name 3 items in a common category within one minute using compensations PRN, in three sessions  Baseline: Goal status: Not met  2.  Pt will demo 70% error awareness in 5 minutes simple conversation, given min nonverbal cue in 3 sessions Baseline:  Goal status: Not met  3.  Pt will produce functional sentence responses 70% success given 2 attempts, in 3 sessions Baseline:  Goal status: not met  4.  Pt will functionally describe 3 expressive language compensations in 2  sessions  Baseline:  Goal status: not met  5.  Pt will demo understanding of 5 minutes simple conversation with two repeats requested by pt, in 3 sessions  Baseline:10/04/22  Goal status: partially met  LONG TERM GOALS: Target date: 11/16/22  Pt will functionally participate in 5 minutes simple conversation in 3 sessions  Baseline:  Goal status: Modified  2. Pt will demo 80% error awareness in 7 minutes simple conversation, given min nonverbal cue in 3 sessions Baseline:  Goal status: Modified  3.  Pt will functionally participate in 10 minutes simple conversation with compensations PRN, in 3 sessions  Baseline:  Goal status: Modified  4.  Pt will demo 80% error awareness in 10 minutes simple conversation, given min nonverbal cue in 3 sessions Baseline:  Goal status: Modified  5.  Pt will demo understanding of 10 minutes simple conversation in 3 sessions  Baseline:  Goal status: Modified  6.  Pt will generate higher/better score on PROM in last 1-2 sessions of ST, compared to initial score Baseline:  Goal status: Ongoing  ASSESSMENT:  CLINICAL IMPRESSION: Yvonne [name] is a 77 y.o. female who was seen today for treatment of aphasia in light of tumor resection and is now post chemo/rad tx with ongoing regular chemotherapy. She cont to exhibit expressive language deficits of anomia, and errors described as semantic paraphasias of which she cont with decr'd awareness. SEE TX NOTE. Currently pt is managing meds, cooking, cleaning, and managing her home with cameras placed for [name] and family to ensure her safety. Pt with questionable cognitive deficits which are challenging to unequivocally confirm at this point due to her expressive and receptive aphasia. Assessment for cognitive-linguistics may be necessary later.   OBJECTIVE IMPAIRMENTS: include attention, awareness, expressive language, receptive language, and  aphasia. These impairments are limiting patient from household  responsibilities, ADLs/IADLs, and effectively communicating at home and in community. Factors affecting potential to achieve goals and functional outcome are severity of impairments.. Patient will benefit from skilled SLP services to address above impairments and improve overall function.  REHAB POTENTIAL: Good  PLAN:  SLP FREQUENCY: 2x/week  SLP DURATION: 12 weeks  PLANNED INTERVENTIONS: Language facilitation, Cognitive reorganization, Internal/external aids, Functional tasks, Multimodal communication approach, SLP instruction and feedback, Compensatory strategies, and Patient/family education    [facility], [place] 10/16/2022, 10:18 PM

## 2022-10-18 ENCOUNTER — Telehealth: Payer: Self-pay | Admitting: Internal Medicine

## 2022-10-18 ENCOUNTER — Other Ambulatory Visit: Payer: Self-pay | Admitting: Internal Medicine

## 2022-10-18 ENCOUNTER — Ambulatory Visit: Payer: Medicare PPO | Admitting: Podiatry

## 2022-10-18 ENCOUNTER — Other Ambulatory Visit (HOSPITAL_COMMUNITY): Payer: Self-pay

## 2022-10-18 DIAGNOSIS — C719 Malignant neoplasm of brain, unspecified: Secondary | ICD-10-CM

## 2022-10-18 NOTE — Telephone Encounter (Signed)
Called patient regarding upcoming March appointments, left a voicemail.

## 2022-10-19 ENCOUNTER — Ambulatory Visit: Payer: Medicare PPO

## 2022-10-19 DIAGNOSIS — R41841 Cognitive communication deficit: Secondary | ICD-10-CM | POA: Diagnosis not present

## 2022-10-19 DIAGNOSIS — R4701 Aphasia: Secondary | ICD-10-CM

## 2022-10-20 NOTE — Therapy (Signed)
OUTPATIENT SPEECH LANGUAGE PATHOLOGY TREATMENT   Patient Name: Yvonne [name] MRN: [id number] DOB:[birth year], 77 y.o., female Today's Date: 10/20/2022  PCP: Kathyrn Lass, MD REFERRING PROVIDER: Jimmy Footman, NP  END OF SESSION:  End of Session - 10/20/22 2316     Visit Number 12    Number of Visits 25    Date for SLP Re-Evaluation 11/16/22    SLP Start Time 0935    SLP Stop Time  1015    SLP Time Calculation (min) 40 min    Activity Tolerance Patient tolerated treatment well                  Past Medical History:  Diagnosis Date   Anxiety    Chest pressure    Degenerative arthritis    Deviated septum    Diabetes mellitus without complication ([place])    diet controlled   Diabetic neuropathy ([place]) 12/03/2017   DJD (degenerative joint disease) of knee    Family history of bladder cancer    Family history of brain cancer    Family history of breast cancer    Family history of ovarian cancer    Family history of prostate cancer    Family history of stomach cancer    Family history of throat cancer    Family history of uterine cancer    Graves disease    RADIOACTIVE IODINE 1983   H/O hiatal hernia    Heart palpitations    History of diastolic dysfunction    per echo in 2011   Hyperlipidemia    Hypertension    Hypothyroidism    LVH (left ventricular hypertrophy)    per echo in 2011   Mitral valve prolapse    Neuropathy, peripheral    DR. Jannifer Franklin 12/15   Normal nuclear stress test 2011   OSA (obstructive sleep apnea)    Polyneuropathy in other diseases classified elsewhere ([place]) 05/21/2013   Rosacea 2019   MELANOMA CHEST REMOVED IN SITU   Sleep difficulties    Tinnitus    Past Surgical History:  Procedure Laterality Date   ABDOMINAL HYSTERECTOMY  2000   TOTAL   APPENDECTOMY  1989   CARDIOVASCULAR STRESS TEST  11/09/2009   EF 75%   CARPAL TUNNEL RELEASE  2002   bilateral   COLONOSCOPY WITH PROPOFOL N/A 10/23/2018   Procedure:  COLONOSCOPY WITH PROPOFOL;  Surgeon: [name], MD;  Location: [facility];  Service: Endoscopy;  Laterality: N/A;   ESOPHAGOGASTRODUODENOSCOPY N/A 11/16/2021   Procedure: ESOPHAGOGASTRODUODENOSCOPY (EGD);  Surgeon: [name], MD;  Location: [facility];  Service: Endoscopy;  Laterality: N/A;   ESOPHAGOGASTRODUODENOSCOPY (EGD) WITH PROPOFOL N/A 10/23/2018   Procedure: ESOPHAGOGASTRODUODENOSCOPY (EGD) WITH PROPOFOL;  Surgeon: [name], MD;  Location: [facility];  Service: Endoscopy;  Laterality: N/A;   EUS N/A 10/23/2018   Procedure: UPPER ENDOSCOPIC ULTRASOUND (EUS) RADIAL;  Surgeon: [name], MD;  Location: [facility];  Service: Endoscopy;  Laterality: N/A;   EUS N/A 11/16/2021   Procedure: UPPER ENDOSCOPIC ULTRASOUND (EUS) RADIAL;  Surgeon: [name], MD;  Location: [facility];  Service: Endoscopy;  Laterality: N/A;   MASS EXCISION Left 06/02/2019   Procedure: EXCISION OF SUBCUTANEOUS ABDOMINAL WALL MASSES LEFT LOWER QUADRANT;  Surgeon: [name], MD;  Location: [facility];  Service: General;  Laterality: Left;   TONSILLECTOMY  1958   TRANSTHORACIC ECHOCARDIOGRAM  11/01/2009   NORMAL LV FILLING AND DIASTOLIC DYSFUNCTION AND MILD AORTIC SCLEROSIS AND TRACE MITRAL REGURGITATION  Patient Active Problem List   Diagnosis Date Noted   Glioblastoma of temporal lobe ([place]) 08/17/2022   Chronic serous otitis media of left ear 06/26/2022   Eustachian tube dysfunction, left 06/26/2022   Glioblastoma, IDH-wildtype ([place]) 05/03/2022   Brain lesion 04/10/2022   Acute ischemic stroke ([place]) 04/04/2022   Osteoarthritis of left knee 11/24/2021   Osteoarthritis of right knee 11/24/2021   Excessive cerumen in ear canal, right 05/29/2021   Sensorineural hearing loss (SNHL), bilateral 05/29/2021   Family history of brain cancer    Family history of throat cancer    Family history of breast cancer    Family history of ovarian cancer    Family history of prostate cancer     Family history of stomach cancer    Family history of bladder cancer    Family history of uterine cancer    Anxiety 10/27/2020   Daytime somnolence 10/27/2020   Diabetic peripheral neuropathy associated with type 2 diabetes mellitus ([place]) 10/27/2020   Excessive sleepiness 10/27/2020   Hypertension 10/27/2020   Obstructive sleep apnea syndrome 10/27/2020   Peripheral neuropathy 10/27/2020   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 10/27/2020   Rosacea 10/27/2020   Vitamin D deficiency 10/27/2020   Deviated septum 05/09/2020   Trochanteric bursitis of left hip 12/30/2019   Special screening for malignant neoplasms, colon    Diverticulosis of colon without hemorrhage    Family history of pancreatic cancer    Pancreatic cyst    Osteoarthritis of knee 12/04/2017   Diabetic neuropathy ([place]) 12/03/2017   Newly diagnosed diabetes ([place]) 06/20/2017   Bilateral hearing loss 05/28/2017   Bilateral impacted cerumen 05/28/2017   Seasonal allergic rhinitis 05/28/2017   Tinnitus, bilateral 05/28/2017   Genetic testing 10/15/2016   DM2 (diabetes mellitus, type 2) ([place]) 01/10/2016   Polyneuropathy in other diseases classified elsewhere ([place]) 05/21/2013   History of hyperglycemia 04/01/2012   Atypical chest pain 12/28/2011   Benign hypertensive heart disease without heart failure 02/05/2011   Hyperlipidemia 02/05/2011   Hypothyroidism 02/05/2011   Palpitations 02/05/2011    ONSET DATE: script 07/26/22   REFERRING DIAG: C71.9 (ICD-10-CM) - Glioblastoma, IDH-wildtype ([place])  THERAPY DIAG:  Aphasia  Cognitive communication deficit  Rationale for Evaluation and Treatment: Rehabilitation  SUBJECTIVE:   SUBJECTIVE STATEMENT: "There's no sugar. It's a sad time." (Pt re: no sugar in her diet now)  Pt accompanied by: self  PERTINENT HISTORY:  04/18/2022: Surgery-Left temporal craniotomy, resection with Dr. Tommi Rumps at [facility]. Path is glioblastoma. 05/16/2022- Radiation Therapy With  Concurrent Temozolomide 75 mg/m2 Daily Followed By Sequential Maintenance Temozolomide x 6-12 cycles. Currently having memory issues and referred by [name], NP. Pt with expressive aphasia since sx in September.  PAIN:  Are you having pain? No   PATIENT GOALS: Pt indicated she wants to improve her speaking.  OBJECTIVE:   DIAGNOSTIC FINDINGS:  1. 3 cm region of masslike signal abnormality in the left temporal lobe with small foci of enhancement, most concerning for primary CNS neoplasm. Demyelinating disease, vasculitis, and infection are less likely considerations. 2. No acute infarct.  STANDARDIZED ASSESSMENTS: QAB: Moderate Aphasia QAB Summary      Word comprehension 6.67  Sentence comprehension 3.33  Word finding 3.00  Grammatical construction 7.50  Speech motor programming 10.00  Repetition 8.33  Reading 10.00  QAB overall ======================================== SCORING (QAB Overall) 0.00-4.99= severe 5.00-7.49= moderate 7.50-8.89= mild 8.90-10.0= no aphasia 6.22      PATIENT REPORTED OUTCOME MEASURES (PROM): Communication Effectiveness Survey:  pt responded with her CES today and scored 25/40 (with higher scores being less imparct on QOL). Pt scored herself 1/4 on "having a conversation with someone at a distance" and 2/4 with "participating in a conversation with strangers in quiet room" and, "conversing with a strnger over the phone".     TODAY'S TREATMENT:                                                                                                                                         DATE:  10/19/22:SLP strongly encouraged pt to practice as much as she could during the day with use of written cues, including the sticky notes she and family developed, and using a small notebook to write down any anomic words. Pt was told to use anomic word in sentences. Today SLP cued pt to use written cues x6 (pt never spontaneously tried this) and pt  successful 5/6 times. Pt did not do this spontaneously at any time during the session today.  10/16/22: Pt emotionally labile during today's session x3, primarily when mentione d her diet. Pt brought in her folders with sticky notes for refrigerator, restaurants, stores. She used the folders in session with good success, reportedly. Pt was nearly functional in conversation today (req'd rare SLP cues for understanidng of pt's message). With communication breakdown x5, pt encouraged to write anomic word - success 3/5.   10/09/22: PROM - CES filled out by [name] (dtr) scored 18/32 (higher scores indicate more communication effectiveness). Pt to return her form next session. Today SLP strongly urged pt to make some compensations at home to foster verbal expression of common items, which she has not completed yet. After having pt write her favorite restaurants she was able to functionally approximate the names later in the session x2. SLP used this as an example for pt and daughter to use labels and written practice at home - SLP suggested pt have a small notebook and pencil to enhance verbal communication. X2 instances today of correct verbal expression without pt awareness.  10/04/22: Pt requires PROM (CES) next session. Pt communicated that she needed to work on her taxes this afternoon, functionally. During the session, SLP targeted words pt could not generate (spoon, taxes, flakes ("corn flakes")) and had her write these words down throughout the session, say them and write and copy sentences including these words. SLP had to provide min-mod A for written sentence generation. Pt has not begun using labels and SLP strongly encouraged her to do this.  09/25/22: Pt tearful about MRI and report tomorrow at [facility]. SLP provided verbal support. PT had written 50% of the content of her frig on the paper she was given last session, along with the items in her freezer she would eat. SLP explained in multiple ways including  demonstration that pt would benefit from having labels in her home for kitchen items, and explained rationale with  gesture and repetition to ensure pt comprehension. Pt to make labels (with assistance PRN)  09/21/22: Pt brought back her personally relevant word list with some aphasic answers written on it. SLP assisted pt correct these responses and then practiced stores and educated pt about using her phone map app as another way to compensate for store/location. Pt req'd mod A usually for correcting the words.   09/18/22: SLP reviewed pt's homework with her and SLP encouraged pt to keep "talking simply" at this time. Pt and SLP worked today with beginning a personally relevant word list to focus her practice time. Pt able to name 2 things without cues (extra time necessary), and pt also req'd initial cue for using her phone to compensate but was successful using Google search to find a restaurant name. Spontaneous written cues were somewhat successful in pt's ability to generate target word.  09/12/22: SLP assisted pt in reviewing her homework and had 89% success with simple 3 word yes/no questions and simple yes no questions. She req'd mod-max A with all erroneous responses. SLP noted today that pt has more success when able to write a portion of the word, or to a lesser degree, see the word. SLP told family with pt here today (son in law) that family should attempt to have pt write the word when she has difficulty with anomia. Pt named two granddaughters' names independently.   PATIENT EDUCATION: Education details: see "today's treatment" Person educated: Patient and Child(ren) Education method: Explanation, Demonstration, and Handouts Education comprehension: verbalized understanding, verbal cues required, and needs further education   GOALS: Goals reviewed with patient? Yes, generally  SHORT TERM GOALS: Target date: 09/28/22  Pt will functionally name 3 items in a common category within one  minute using compensations PRN, in three sessions  Baseline: Goal status: Not met  2.  Pt will demo 70% error awareness in 5 minutes simple conversation, given min nonverbal cue in 3 sessions Baseline:  Goal status: Not met  3.  Pt will produce functional sentence responses 70% success given 2 attempts, in 3 sessions Baseline:  Goal status: not met  4.  Pt will functionally describe 3 expressive language compensations in 2 sessions  Baseline:  Goal status: not met  5.  Pt will demo understanding of 5 minutes simple conversation with two repeats requested by pt, in 3 sessions  Baseline:10/04/22  Goal status: partially met  LONG TERM GOALS: Target date: 11/16/22  Pt will functionally participate in 5 minutes simple conversation in 3 sessions  Baseline:  Goal status: Modified  2. Pt will demo 80% error awareness in 7 minutes simple conversation, given min nonverbal cue in 3 sessions Baseline:  Goal status: Modified  3.  Pt will functionally participate in 10 minutes simple conversation with compensations PRN, in 3 sessions  Baseline:  Goal status: Modified  4.  Pt will demo 80% error awareness in 10 minutes simple conversation, given min nonverbal cue in 3 sessions Baseline:  Goal status: Modified  5.  Pt will demo understanding of 10 minutes simple conversation in 3 sessions  Baseline:  Goal status: Modified  6.  Pt will generate higher/better score on PROM in last 1-2 sessions of ST, compared to initial score Baseline:  Goal status: Ongoing  ASSESSMENT:  CLINICAL IMPRESSION: Tyjuana is a 77 y.o. female who was seen today for treatment of aphasia in light of tumor resection and is now post chemo/rad tx with ongoing regular chemotherapy. She cont to exhibit expressive language deficits  of anomia, and errors described as semantic paraphasias of which she cont with decr'd awareness. SEE TX NOTE. She is not using any compensations spontaneously to date. Currently pt is managing  meds, cooking, cleaning, and managing her home with cameras placed for [name] and family to ensure her safety. Pt with questionable cognitive deficits which are challenging to unequivocally confirm at this point due to her expressive and receptive aphasia. Assessment for cognitive-linguistics may be necessary later.   OBJECTIVE IMPAIRMENTS: include attention, awareness, expressive language, receptive language, and aphasia. These impairments are limiting patient from household responsibilities, ADLs/IADLs, and effectively communicating at home and in community. Factors affecting potential to achieve goals and functional outcome are severity of impairments.. Patient will benefit from skilled SLP services to address above impairments and improve overall function.  REHAB POTENTIAL: Good  PLAN:  SLP FREQUENCY: 2x/week  SLP DURATION: 12 weeks  PLANNED INTERVENTIONS: Language facilitation, Cognitive reorganization, Internal/external aids, Functional tasks, Multimodal communication approach, SLP instruction and feedback, Compensatory strategies, and Patient/family education    [facility], [place] 10/20/2022, 11:17 PM

## 2022-10-22 DIAGNOSIS — H903 Sensorineural hearing loss, bilateral: Secondary | ICD-10-CM | POA: Diagnosis not present

## 2022-10-23 ENCOUNTER — Ambulatory Visit: Payer: Medicare PPO

## 2022-10-24 ENCOUNTER — Other Ambulatory Visit (HOSPITAL_COMMUNITY): Payer: Self-pay

## 2022-10-25 ENCOUNTER — Ambulatory Visit: Payer: Medicare PPO

## 2022-10-25 DIAGNOSIS — R41841 Cognitive communication deficit: Secondary | ICD-10-CM | POA: Diagnosis not present

## 2022-10-25 DIAGNOSIS — R4701 Aphasia: Secondary | ICD-10-CM

## 2022-10-25 NOTE — Therapy (Signed)
OUTPATIENT SPEECH LANGUAGE PATHOLOGY TREATMENT/discharge summary   Patient Name: Yvonne [name] MRN: [id number] DOB:[birth year], 77 y.o., female Today's Date: 10/25/2022  PCP: Kathyrn Lass, MD REFERRING PROVIDER: Jimmy Footman, NP  END OF SESSION:  End of Session - 10/25/22 1337     Visit Number 13    Number of Visits 25    Date for SLP Re-Evaluation 11/16/22    SLP Start Time 0850    SLP Stop Time  0930    SLP Time Calculation (min) 40 min    Activity Tolerance Patient tolerated treatment well                  Past Medical History:  Diagnosis Date   Anxiety    Chest pressure    Degenerative arthritis    Deviated septum    Diabetes mellitus without complication ([place])    diet controlled   Diabetic neuropathy ([place]) 12/03/2017   DJD (degenerative joint disease) of knee    Family history of bladder cancer    Family history of brain cancer    Family history of breast cancer    Family history of ovarian cancer    Family history of prostate cancer    Family history of stomach cancer    Family history of throat cancer    Family history of uterine cancer    Graves disease    RADIOACTIVE IODINE 1983   H/O hiatal hernia    Heart palpitations    History of diastolic dysfunction    per echo in 2011   Hyperlipidemia    Hypertension    Hypothyroidism    LVH (left ventricular hypertrophy)    per echo in 2011   Mitral valve prolapse    Neuropathy, peripheral    DR. Jannifer Franklin 12/15   Normal nuclear stress test 2011   OSA (obstructive sleep apnea)    Polyneuropathy in other diseases classified elsewhere ([place]) 05/21/2013   Rosacea 2019   MELANOMA CHEST REMOVED IN SITU   Sleep difficulties    Tinnitus    Past Surgical History:  Procedure Laterality Date   ABDOMINAL HYSTERECTOMY  2000   TOTAL   APPENDECTOMY  1989   CARDIOVASCULAR STRESS TEST  11/09/2009   EF 75%   CARPAL TUNNEL RELEASE  2002   bilateral   COLONOSCOPY WITH PROPOFOL N/A 10/23/2018    Procedure: COLONOSCOPY WITH PROPOFOL;  Surgeon: [name], MD;  Location: [facility];  Service: Endoscopy;  Laterality: N/A;   ESOPHAGOGASTRODUODENOSCOPY N/A 11/16/2021   Procedure: ESOPHAGOGASTRODUODENOSCOPY (EGD);  Surgeon: [name], MD;  Location: [facility];  Service: Endoscopy;  Laterality: N/A;   ESOPHAGOGASTRODUODENOSCOPY (EGD) WITH PROPOFOL N/A 10/23/2018   Procedure: ESOPHAGOGASTRODUODENOSCOPY (EGD) WITH PROPOFOL;  Surgeon: [name], MD;  Location: [facility];  Service: Endoscopy;  Laterality: N/A;   EUS N/A 10/23/2018   Procedure: UPPER ENDOSCOPIC ULTRASOUND (EUS) RADIAL;  Surgeon: [name], MD;  Location: [facility];  Service: Endoscopy;  Laterality: N/A;   EUS N/A 11/16/2021   Procedure: UPPER ENDOSCOPIC ULTRASOUND (EUS) RADIAL;  Surgeon: [name], MD;  Location: [facility];  Service: Endoscopy;  Laterality: N/A;   MASS EXCISION Left 06/02/2019   Procedure: EXCISION OF SUBCUTANEOUS ABDOMINAL WALL MASSES LEFT LOWER QUADRANT;  Surgeon: [name], MD;  Location: [facility];  Service: General;  Laterality: Left;   TONSILLECTOMY  1958   TRANSTHORACIC ECHOCARDIOGRAM  11/01/2009   NORMAL LV FILLING AND DIASTOLIC DYSFUNCTION AND MILD AORTIC SCLEROSIS AND TRACE MITRAL REGURGITATION  Patient Active Problem List   Diagnosis Date Noted   Glioblastoma of temporal lobe ([place]) 08/17/2022   Chronic serous otitis media of left ear 06/26/2022   Eustachian tube dysfunction, left 06/26/2022   Glioblastoma, IDH-wildtype ([place]) 05/03/2022   Brain lesion 04/10/2022   Acute ischemic stroke ([place]) 04/04/2022   Osteoarthritis of left knee 11/24/2021   Osteoarthritis of right knee 11/24/2021   Excessive cerumen in ear canal, right 05/29/2021   Sensorineural hearing loss (SNHL), bilateral 05/29/2021   Family history of brain cancer    Family history of throat cancer    Family history of breast cancer    Family history of ovarian cancer    Family history of  prostate cancer    Family history of stomach cancer    Family history of bladder cancer    Family history of uterine cancer    Anxiety 10/27/2020   Daytime somnolence 10/27/2020   Diabetic peripheral neuropathy associated with type 2 diabetes mellitus ([place]) 10/27/2020   Excessive sleepiness 10/27/2020   Hypertension 10/27/2020   Obstructive sleep apnea syndrome 10/27/2020   Peripheral neuropathy 10/27/2020   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 10/27/2020   Rosacea 10/27/2020   Vitamin D deficiency 10/27/2020   Deviated septum 05/09/2020   Trochanteric bursitis of left hip 12/30/2019   Special screening for malignant neoplasms, colon    Diverticulosis of colon without hemorrhage    Family history of pancreatic cancer    Pancreatic cyst    Osteoarthritis of knee 12/04/2017   Diabetic neuropathy ([place]) 12/03/2017   Newly diagnosed diabetes ([place]) 06/20/2017   Bilateral hearing loss 05/28/2017   Bilateral impacted cerumen 05/28/2017   Seasonal allergic rhinitis 05/28/2017   Tinnitus, bilateral 05/28/2017   Genetic testing 10/15/2016   DM2 (diabetes mellitus, type 2) ([place]) 01/10/2016   Polyneuropathy in other diseases classified elsewhere ([place]) 05/21/2013   History of hyperglycemia 04/01/2012   Atypical chest pain 12/28/2011   Benign hypertensive heart disease without heart failure 02/05/2011   Hyperlipidemia 02/05/2011   Hypothyroidism 02/05/2011   Palpitations 02/05/2011   SPEECH THERAPY DISCHARGE SUMMARY  Visits from Start of Care: 13  Current functional level related to goals / functional outcomes: See below.   Remaining deficits: See below.   Education / Equipment: See "today's treatment" below.   Patient agrees to discharge. Patient goals were partially met. Patient is being discharged due to maximized rehab potential. at this time.      ONSET DATE: script 07/26/22   REFERRING DIAG: C71.9 (ICD-10-CM) - Glioblastoma, IDH-wildtype ([place])  THERAPY  DIAG:  Aphasia  Cognitive communication deficit  Rationale for Evaluation and Treatment: Rehabilitation  SUBJECTIVE:   SUBJECTIVE STATEMENT: "It's my last one today - thank you. You were a good one, [name]."  Pt accompanied by: self  PERTINENT HISTORY:  04/18/2022: Surgery-Left temporal craniotomy, resection with Dr. Tommi Rumps at [facility]. Path is glioblastoma. 05/16/2022- Radiation Therapy With Concurrent Temozolomide 75 mg/m2 Daily Followed By Sequential Maintenance Temozolomide x 6-12 cycles. Currently having memory issues and referred by [name], NP. Pt with expressive aphasia since sx in September.  PAIN:  Are you having pain? No   PATIENT GOALS: Pt indicated she wants to improve her speaking.  OBJECTIVE:   DIAGNOSTIC FINDINGS:  1. 3 cm region of masslike signal abnormality in the left temporal lobe with small foci of enhancement, most concerning for primary CNS neoplasm. Demyelinating disease, vasculitis, and infection are less likely considerations. 2. No acute infarct.  STANDARDIZED ASSESSMENTS: QAB: Moderate  Aphasia QAB Summary      Word comprehension 6.67  Sentence comprehension 3.33  Word finding 3.00  Grammatical construction 7.50  Speech motor programming 10.00  Repetition 8.33  Reading 10.00  QAB overall ======================================== SCORING (QAB Overall) 0.00-4.99= severe 5.00-7.49= moderate 7.50-8.89= mild 8.90-10.0= no aphasia 6.22      PATIENT REPORTED OUTCOME MEASURES (PROM): Communication Effectiveness Survey: pt responded with her CES today and scored 25/40 (with higher scores being less imparct on QOL). Pt scored herself 1/4 on "having a conversation with someone at a distance" and 2/4 with "participating in a conversation with strangers in quiet room" and, "conversing with a strnger over the phone".     TODAY'S TREATMENT:                                                                                                                                          DATE:  10/25/22: SLP used written cues for word finding task with functional topics/categories. Pt req'd mod A occasionally to generate words. Pt would like to focus at this time on continuing to work at home with wordfinding tasks. SLP agrees and strongly encouraged pt to label objects in her kitchen cupboards, and in her closet and dresser/s and SAY these words as she accesses the items each day. SLP also provided pt with some functional homework for word finding and told pt that she could return at any time with a physician's order.  10/19/22:SLP strongly encouraged pt to practice as much as she could during the day with use of written cues, including the sticky notes she and family developed, and using a small notebook to write down any anomic words. Pt was told to use anomic word in sentences. Today SLP cued pt to use written cues x6 (pt never spontaneously tried this) and pt successful 5/6 times. Pt did not do this spontaneously at any time during the session today.  10/16/22: Pt emotionally labile during today's session x3, primarily when mentione d her diet. Pt brought in her folders with sticky notes for refrigerator, restaurants, stores. She used the folders in session with good success, reportedly. Pt was nearly functional in conversation today (req'd rare SLP cues for understanidng of pt's message). With communication breakdown x5, pt encouraged to write anomic word - success 3/5.   10/09/22: PROM - CES filled out by [name] (dtr) scored 18/32 (higher scores indicate more communication effectiveness). Pt to return her form next session. Today SLP strongly urged pt to make some compensations at home to foster verbal expression of common items, which she has not completed yet. After having pt write her favorite restaurants she was able to functionally approximate the names later in the session x2. SLP used this as an example for pt and daughter to use labels and  written practice at home - SLP suggested pt have a small notebook and pencil to enhance verbal communication.  X2 instances today of correct verbal expression without pt awareness.  10/04/22: Pt requires PROM (CES) next session. Pt communicated that she needed to work on her taxes this afternoon, functionally. During the session, SLP targeted words pt could not generate (spoon, taxes, flakes ("corn flakes")) and had her write these words down throughout the session, say them and write and copy sentences including these words. SLP had to provide min-mod A for written sentence generation. Pt has not begun using labels and SLP strongly encouraged her to do this.  09/25/22: Pt tearful about MRI and report tomorrow at [facility]. SLP provided verbal support. PT had written 50% of the content of her frig on the paper she was given last session, along with the items in her freezer she would eat. SLP explained in multiple ways including demonstration that pt would benefit from having labels in her home for kitchen items, and explained rationale with gesture and repetition to ensure pt comprehension. Pt to make labels (with assistance PRN)  09/21/22: Pt brought back her personally relevant word list with some aphasic answers written on it. SLP assisted pt correct these responses and then practiced stores and educated pt about using her phone map app as another way to compensate for store/location. Pt req'd mod A usually for correcting the words.   09/18/22: SLP reviewed pt's homework with her and SLP encouraged pt to keep "talking simply" at this time. Pt and SLP worked today with beginning a personally relevant word list to focus her practice time. Pt able to name 2 things without cues (extra time necessary), and pt also req'd initial cue for using her phone to compensate but was successful using Google search to find a restaurant name. Spontaneous written cues were somewhat successful in pt's ability to generate target  word.  09/12/22: SLP assisted pt in reviewing her homework and had 89% success with simple 3 word yes/no questions and simple yes no questions. She req'd mod-max A with all erroneous responses. SLP noted today that pt has more success when able to write a portion of the word, or to a lesser degree, see the word. SLP told family with pt here today (son in law) that family should attempt to have pt write the word when she has difficulty with anomia. Pt named two granddaughters' names independently.   PATIENT EDUCATION: Education details: see "today's treatment" Person educated: Patient and Child(ren) Education method: Explanation, Demonstration, and Handouts Education comprehension: verbalized understanding, verbal cues required, and needs further education   GOALS: Goals reviewed with patient? Yes, generally  SHORT TERM GOALS: Target date: 09/28/22  Pt will functionally name 3 items in a common category within one minute using compensations PRN, in three sessions  Baseline: Goal status: Not met  2.  Pt will demo 70% error awareness in 5 minutes simple conversation, given min nonverbal cue in 3 sessions Baseline:  Goal status: Not met  3.  Pt will produce functional sentence responses 70% success given 2 attempts, in 3 sessions Baseline:  Goal status: not met  4.  Pt will functionally describe 3 expressive language compensations in 2 sessions  Baseline:  Goal status: not met  5.  Pt will demo understanding of 5 minutes simple conversation with two repeats requested by pt, in 3 sessions  Baseline:10/04/22  Goal status: partially met  LONG TERM GOALS: Target date: 11/16/22  Pt will functionally participate in 5 minutes simple conversation in 3 sessions  Baseline: 10/25/22 Goal status: Partially met  2. Pt will  demo 80% error awareness in 7 minutes simple conversation, given min nonverbal cue in 3 sessions Baseline:  Goal status: Not met  3.  Pt will functionally participate in 10  minutes simple conversation with compensations PRN, in 3 sessions  Baseline:  Goal status: Not met  4.  Pt will demo 80% error awareness in 10 minutes simple conversation, given min nonverbal cue in 3 sessions Baseline:  Goal status: Not met  5.  Pt will demo understanding of 10 minutes simple conversation in 3 sessions  Baseline: 10/25/22  Goal status: Partially met  6.  Pt will generate higher/better score on PROM in last 1-2 sessions of ST, compared to initial score Baseline:  Goal status: Deferred - pt left prior to filling out PROM  ASSESSMENT:  CLINICAL IMPRESSION: Pt will be discharged today to give her more time with practice at home. She has made functional progress however overall progress is affected by her ongoing cycles of chemotherapy. Yvonne [name] is a 77 y.o. female who has been seen for treatment of aphasia in light of tumor resection and is now post chemo/rad tx with ongoing regular chemotherapy. She cont to exhibit expressive language deficits of anomia, and errors described as semantic paraphasias of which she cont with decr'd awareness. SEE TX NOTE. She attempts compensation of synonym and written cues. Currently pt is managing meds, cooking, cleaning, and managing her home with cameras placed for [name] and family to ensure her safety.   OBJECTIVE IMPAIRMENTS: include attention, awareness, expressive language, receptive language, and aphasia. These impairments are limiting patient from household responsibilities, ADLs/IADLs, and effectively communicating at home and in community. Factors affecting potential to achieve goals and functional outcome are severity of impairments.. Patient will benefit from skilled SLP services to address above impairments and improve overall function.  REHAB POTENTIAL: Good  PLAN:  Discharge today  PLANNED INTERVENTIONS: Language facilitation, Cognitive reorganization, Internal/external aids, Functional tasks, Multimodal communication approach,  SLP instruction and feedback, Compensatory strategies, and Patient/family education    [facility], [place] 10/25/2022, 1:37 PM

## 2022-10-26 ENCOUNTER — Other Ambulatory Visit (HOSPITAL_COMMUNITY): Payer: Self-pay

## 2022-10-29 ENCOUNTER — Other Ambulatory Visit: Payer: Self-pay

## 2022-10-29 ENCOUNTER — Inpatient Hospital Stay: Payer: Medicare PPO | Admitting: Internal Medicine

## 2022-10-29 ENCOUNTER — Other Ambulatory Visit (HOSPITAL_COMMUNITY): Payer: Self-pay

## 2022-10-29 ENCOUNTER — Other Ambulatory Visit: Payer: Self-pay | Admitting: Internal Medicine

## 2022-10-29 ENCOUNTER — Inpatient Hospital Stay: Payer: Medicare PPO | Attending: Internal Medicine

## 2022-10-29 VITALS — BP 140/68 | HR 74 | Temp 97.7°F | Resp 18 | Wt 147.3 lb

## 2022-10-29 DIAGNOSIS — C712 Malignant neoplasm of temporal lobe: Secondary | ICD-10-CM | POA: Diagnosis not present

## 2022-10-29 DIAGNOSIS — Z87891 Personal history of nicotine dependence: Secondary | ICD-10-CM | POA: Insufficient documentation

## 2022-10-29 DIAGNOSIS — C719 Malignant neoplasm of brain, unspecified: Secondary | ICD-10-CM | POA: Diagnosis not present

## 2022-10-29 DIAGNOSIS — Z8049 Family history of malignant neoplasm of other genital organs: Secondary | ICD-10-CM | POA: Insufficient documentation

## 2022-10-29 DIAGNOSIS — Z801 Family history of malignant neoplasm of trachea, bronchus and lung: Secondary | ICD-10-CM | POA: Insufficient documentation

## 2022-10-29 DIAGNOSIS — Z8042 Family history of malignant neoplasm of prostate: Secondary | ICD-10-CM | POA: Insufficient documentation

## 2022-10-29 DIAGNOSIS — Z79899 Other long term (current) drug therapy: Secondary | ICD-10-CM | POA: Insufficient documentation

## 2022-10-29 DIAGNOSIS — Z803 Family history of malignant neoplasm of breast: Secondary | ICD-10-CM | POA: Diagnosis not present

## 2022-10-29 DIAGNOSIS — E1140 Type 2 diabetes mellitus with diabetic neuropathy, unspecified: Secondary | ICD-10-CM | POA: Insufficient documentation

## 2022-10-29 DIAGNOSIS — Z8041 Family history of malignant neoplasm of ovary: Secondary | ICD-10-CM | POA: Insufficient documentation

## 2022-10-29 DIAGNOSIS — Z90710 Acquired absence of both cervix and uterus: Secondary | ICD-10-CM | POA: Insufficient documentation

## 2022-10-29 DIAGNOSIS — Z800 Family history of malignant neoplasm of digestive organs: Secondary | ICD-10-CM | POA: Diagnosis not present

## 2022-10-29 LAB — CBC WITH DIFFERENTIAL (CANCER CENTER ONLY)
Abs Immature Granulocytes: 0.04 10*3/uL (ref 0.00–0.07)
Basophils Absolute: 0.1 10*3/uL (ref 0.0–0.1)
Basophils Relative: 1 %
Eosinophils Absolute: 0.2 10*3/uL (ref 0.0–0.5)
Eosinophils Relative: 3 %
HCT: 38.1 % (ref 36.0–46.0)
Hemoglobin: 13.7 g/dL (ref 12.0–15.0)
Immature Granulocytes: 1 %
Lymphocytes Relative: 10 %
Lymphs Abs: 0.7 10*3/uL (ref 0.7–4.0)
MCH: 29.2 pg (ref 26.0–34.0)
MCHC: 36 g/dL (ref 30.0–36.0)
MCV: 81.2 fL (ref 80.0–100.0)
Monocytes Absolute: 0.8 10*3/uL (ref 0.1–1.0)
Monocytes Relative: 10 %
Neutro Abs: 5.9 10*3/uL (ref 1.7–7.7)
Neutrophils Relative %: 75 %
Platelet Count: 144 10*3/uL — ABNORMAL LOW (ref 150–400)
RBC: 4.69 MIL/uL (ref 3.87–5.11)
RDW: 16.1 % — ABNORMAL HIGH (ref 11.5–15.5)
WBC Count: 7.7 10*3/uL (ref 4.0–10.5)
nRBC: 0 % (ref 0.0–0.2)

## 2022-10-29 LAB — CMP (CANCER CENTER ONLY)
ALT: 18 U/L (ref 0–44)
AST: 15 U/L (ref 15–41)
Albumin: 4.5 g/dL (ref 3.5–5.0)
Alkaline Phosphatase: 42 U/L (ref 38–126)
Anion gap: 8 (ref 5–15)
BUN: 30 mg/dL — ABNORMAL HIGH (ref 8–23)
CO2: 28 mmol/L (ref 22–32)
Calcium: 9.7 mg/dL (ref 8.9–10.3)
Chloride: 101 mmol/L (ref 98–111)
Creatinine: 0.74 mg/dL (ref 0.44–1.00)
GFR, Estimated: >60 mL/min (ref >60)
Glucose, Bld: 147 mg/dL — ABNORMAL HIGH (ref 70–99)
Potassium: 3.6 mmol/L (ref 3.5–5.1)
Sodium: 137 mmol/L (ref 135–145)
Total Bilirubin: 0.5 mg/dL (ref 0.3–1.2)
Total Protein: 6.8 g/dL (ref 6.5–8.1)

## 2022-10-29 MED ORDER — TEMOZOLOMIDE 100 MG PO CAPS
300.0000 mg | ORAL_CAPSULE | Freq: Every day | ORAL | 0 refills | Status: DC
  Filled 2022-10-29: qty 15, 5d supply, fill #0

## 2022-10-29 MED ORDER — TEMOZOLOMIDE 100 MG PO CAPS
300.0000 mg | ORAL_CAPSULE | Freq: Every day | ORAL | 0 refills | Status: DC
  Filled 2022-10-29: qty 15, 28d supply, fill #0

## 2022-10-29 NOTE — Progress Notes (Signed)
[facility], [place] [address] [phone]   Interval Evaluation  Date of Service: 10/29/22 Patient Name: Yvonne [name] Patient MRN: [id number] Patient DOB: 30-Nov-1945 Provider: Ventura Sellers, MD  Identifying Statement:  Yvonne [name] is a 77 y.o. female with left temporal glioblastoma    Oncologic History: Oncology History  Glioblastoma, IDH-wildtype ([place])  04/18/2022 Surgery   Left temporal craniotomy, resection with Dr. Tommi Rumps at [facility].  Path is glioblastoma, IDHwt   05/16/2022 -  Chemotherapy   [name] : BRAIN GLIOBLASTOMA Radiation Therapy With Concurrent Temozolomide 75 mg/m2 Daily Followed By Sequential Maintenance Temozolomide x 6-12 cycles       Biomarkers:  MGMT Unknown.  IDH 1/2 Wild type.  EGFR Unknown  TERT Unknown   Interval History: Yvonne [name] presents today for follow up, now having completed cycle #3 of adjuvant Temodar.  Tolerated treatment well overall.  No new symptoms.  She and her daughter describe no significant changes with regards to energy, cognition, speech.  Issues with short term memory, irritable behavior are stable.  No further seizures, no headaches.    H+P (05/03/22) Patient presented to medical attention in August 2023 with episode of sudden onset speech impairment.  Initially treated as stroke with TPA, CNS imaging later demonstrated enhancing mass in the left temporal lobe, c/w primary brain tumor.  She underwent resection at [facility] with Dr. Tommi Rumps after second opinion there.  Following surgery she experienced some speech impairment; this has improved modestly, but she still experiences difficulty with finding correct words.  Also struggling with short term memory.  She is currently living with her daughter given cognitive limitations, need for some supervision.  No further seizures with the Keppra 500mg  twice per day.  Currently on decadron 2mg  daily  per discharge taper from [facility].    Medications: Current Outpatient Medications on File Prior to Visit  Medication Sig Dispense Refill   acetaminophen (TYLENOL) 500 MG tablet Take 1,000 mg by mouth every 8 (eight) hours as needed for moderate pain.     Cholecalciferol (VITAMIN D) 50 MCG (2000 UT) tablet Take 2,000 Units by mouth daily.      fluticasone (FLONASE) 50 MCG/ACT nasal spray Place 2 sprays into both nostrils at bedtime.     furosemide (LASIX) 20 MG tablet Take 10 mg by mouth as needed for edema.     gabapentin (NEURONTIN) 300 MG capsule Take 300 mg by mouth at bedtime.      JARDIANCE 10 MG TABS tablet 1 tablet Orally Once a day     lacosamide (VIMPAT) 50 MG TABS tablet Take 1 tablet (50 mg total) by mouth 2 (two) times daily. 60 tablet 3   levothyroxine (SYNTHROID) 88 MCG tablet Take 88 mcg by mouth daily before breakfast.     metFORMIN (GLUCOPHAGE) 500 MG tablet Take 1,000 mg by mouth 2 (two) times daily.     metoprolol succinate (TOPROL-XL) 100 MG 24 hr tablet Take 100 mg by mouth daily.     metroNIDAZOLE (METROCREAM) 0.75 % cream Apply 1 application  topically daily.     ondansetron (ZOFRAN) 8 MG tablet Take 1 tablet (8 mg total) by mouth every 8 (eight) hours as needed for nausea or vomiting. May take 30-60 minutes prior to Temodar administration if nausea/vomiting occurs as needed. 30 tablet 1   prochlorperazine (COMPAZINE) 10 MG tablet Take 1 tablet (10 mg total) by mouth every 6 (six)  hours as needed for nausea or vomiting. 30 tablet 0   rosuvastatin (CRESTOR) 5 MG tablet Take 5 mg by mouth daily.     temozolomide (TEMODAR) 100 MG capsule Take 3 capsules (300 mg total) by mouth daily. Take for 5 days on, 23 days off. Repeat every 28 days. May take on an empty stomach to decrease nausea & vomiting. 15 capsule 0   Current Facility-Administered Medications on File Prior to Visit  Medication Dose Route Frequency Provider Last Rate Last Admin   influenza vaccine adjuvanted (FLUAD)  injection 0.5 mL  0.5 mL Intramuscular Once Tauno Falotico, Acey Lav, MD        Allergies:  Allergies  Allergen Reactions   Tobramycin Hives   Sulfa Antibiotics     Stomach upset   Erythromycin Nausea Only   Past Medical History:  Past Medical History:  Diagnosis Date   Anxiety    Chest pressure    Degenerative arthritis    Deviated septum    Diabetes mellitus without complication (HCC)    diet controlled   Diabetic neuropathy ([place]) 12/03/2017   DJD (degenerative joint disease) of knee    Family history of bladder cancer    Family history of brain cancer    Family history of breast cancer    Family history of ovarian cancer    Family history of prostate cancer    Family history of stomach cancer    Family history of throat cancer    Family history of uterine cancer    Graves disease    RADIOACTIVE IODINE 1983   H/O hiatal hernia    Heart palpitations    History of diastolic dysfunction    per echo in 2011   Hyperlipidemia    Hypertension    Hypothyroidism    LVH (left ventricular hypertrophy)    per echo in 2011   Mitral valve prolapse    Neuropathy, peripheral    DR. Jannifer Franklin 12/15   Normal nuclear stress test 2011   OSA (obstructive sleep apnea)    Polyneuropathy in other diseases classified elsewhere ([place]) 05/21/2013   Rosacea 2019   MELANOMA CHEST REMOVED IN SITU   Sleep difficulties    Tinnitus    Past Surgical History:  Past Surgical History:  Procedure Laterality Date   ABDOMINAL HYSTERECTOMY  2000   TOTAL   APPENDECTOMY  1989   CARDIOVASCULAR STRESS TEST  11/09/2009   EF 75%   CARPAL TUNNEL RELEASE  2002   bilateral   COLONOSCOPY WITH PROPOFOL N/A 10/23/2018   Procedure: COLONOSCOPY WITH PROPOFOL;  Surgeon: [name], MD;  Location: [facility];  Service: Endoscopy;  Laterality: N/A;   ESOPHAGOGASTRODUODENOSCOPY N/A 11/16/2021   Procedure: ESOPHAGOGASTRODUODENOSCOPY (EGD);  Surgeon: [name], MD;  Location: [facility];  Service: Endoscopy;   Laterality: N/A;   ESOPHAGOGASTRODUODENOSCOPY (EGD) WITH PROPOFOL N/A 10/23/2018   Procedure: ESOPHAGOGASTRODUODENOSCOPY (EGD) WITH PROPOFOL;  Surgeon: [name], MD;  Location: [facility];  Service: Endoscopy;  Laterality: N/A;   EUS N/A 10/23/2018   Procedure: UPPER ENDOSCOPIC ULTRASOUND (EUS) RADIAL;  Surgeon: [name], MD;  Location: [facility];  Service: Endoscopy;  Laterality: N/A;   EUS N/A 11/16/2021   Procedure: UPPER ENDOSCOPIC ULTRASOUND (EUS) RADIAL;  Surgeon: [name], MD;  Location: [facility];  Service: Endoscopy;  Laterality: N/A;   MASS EXCISION Left 06/02/2019   Procedure: EXCISION OF SUBCUTANEOUS ABDOMINAL WALL MASSES LEFT LOWER QUADRANT;  Surgeon: [name], MD;  Location: [facility];  Service: General;  Laterality: Left;   TONSILLECTOMY  1958   TRANSTHORACIC ECHOCARDIOGRAM  11/01/2009   NORMAL LV FILLING AND DIASTOLIC DYSFUNCTION AND MILD AORTIC SCLEROSIS AND TRACE MITRAL REGURGITATION   Social History:  Social History   Socioeconomic History   Marital status: Married    Spouse name: Edd Fabian   Number of children: 1   Years of education: COLLEGE   Highest education level: Not on file  Occupational History   Occupation: Retired  Tobacco Use   Smoking status: Former    Packs/day: 0.50    Years: 10.00    Additional pack years: 0.00    Total pack years: 5.00    Types: Cigarettes    Quit date: 08/14/1975    Years since quitting: 47.2   Smokeless tobacco: Never  Vaping Use   Vaping Use: Never used  Substance and Sexual Activity   Alcohol use: Yes    Comment: one glass of wine a week   Drug use: No   Sexual activity: Not on file  Other Topics Concern   Not on file  Social History Narrative   Lives at home w/ her husband, Edd Fabian.   Right-handed.   Drinks no caffeine but does eat chocolate occasionally.   epworth sleepiness scale = 3 (01/10/16)   Social Determinants of Health   Financial Resource Strain: Medium Risk (06/15/2022)    Overall Financial Resource Strain (CARDIA)    Difficulty of Paying Living Expenses: Somewhat hard  Food Insecurity: No Food Insecurity (06/15/2022)   Hunger Vital Sign    Worried About Running Out of Food in the Last Year: Never true    Ran Out of Food in the Last Year: Never true  Transportation Needs: No Transportation Needs (06/15/2022)   PRAPARE - Hydrologist (Medical): No    Lack of Transportation (Non-Medical): No  Physical Activity: Not on file  Stress: Not on file  Social Connections: Not on file  Intimate Partner Violence: Not At Risk (06/15/2022)   Humiliation, Afraid, Rape, and Kick questionnaire    Fear of Current or Ex-Partner: No    Emotionally Abused: No    Physically Abused: No    Sexually Abused: No   Family History:  Family History  Problem Relation Age of Onset   Hypertension Mother    Heart disease Mother    Dementia Mother    Heart disease Father    Arthritis Father    Diabetes Father    Hypertension Father    Pancreatic cancer Brother 85       pancreatic   Other Brother        automobile accident   Throat cancer Maternal Uncle    Brain cancer Paternal Aunt 72       malignant brain cancer   Stroke Maternal Grandmother    Heart disease Maternal Grandfather    Pancreatic cancer Cousin 46       pancreatic (maternal first cousin)   Pancreatic cancer Cousin 41       pancreatic, smoker (maternal first cousin)   Breast cancer Cousin 68       breast (maternal first cousin)   Ovarian cancer Cousin    Uterine cancer Cousin 55       uterine (paternal first cousin)   Cancer Cousin 104       unknown primary (paternal first cousin)   Bladder Cancer Cousin 76       (paternal first cousin)   Lung cancer Cousin  Throat cancer Cousin    Prostate cancer Cousin        (maternal first cousin)   Stomach cancer Cousin 68       (maternal first cousin)   Cancer Other 39       pancreatic neuroendocrine carcinoma   Colon cancer Neg Hx     Neuropathy Neg Hx     Review of Systems: Constitutional: Doesn't report fevers, chills or abnormal weight loss Eyes: Doesn't report blurriness of vision Ears, nose, mouth, throat, and face: Doesn't report sore throat Respiratory: Doesn't report cough, dyspnea or wheezes Cardiovascular: Doesn't report palpitation, chest discomfort  Gastrointestinal:  Doesn't report nausea, constipation, diarrhea GU: Doesn't report incontinence Skin: Doesn't report skin rashes Neurological: Per HPI Musculoskeletal: Doesn't report joint pain Behavioral/Psych: Doesn't report anxiety  Physical Exam: Vitals:   10/29/22 1040  BP: (!) 140/68  Pulse: 74  Resp: 18  Temp: 97.7 F (36.5 C)  SpO2: 99%   KPS: 70. General: Alert, cooperative, pleasant, in no acute distress Head: Normal EENT: No conjunctival injection or scleral icterus.  Lungs: Resp effort normal Cardiac: Regular rate Abdomen: Non-distended abdomen Skin: No rashes cyanosis or petechiae. Extremities: No clubbing or edema  Neurologic Exam: Mental Status: Awake, alert, attentive to examiner. Oriented to self and environment. Language is fluent but comprehension is markedly impaired.  Anterograde amnesia is noted, mild neglect, elements of agnosia. Cranial Nerves: Visual acuity is grossly normal. Visual fields are full. Extra-ocular movements intact. No ptosis. Face is symmetric Motor: Tone and bulk are normal. Power is full in both arms and legs. Reflexes are symmetric, no pathologic reflexes present.  Sensory: Intact to light touch Gait: Normal.   Labs: I have reviewed the data as listed    Component Value Date/Time   NA 135 09/27/2022 1100   K 4.3 09/27/2022 1100   CL 100 09/27/2022 1100   CO2 28 09/27/2022 1100   GLUCOSE 307 (H) 09/27/2022 1100   BUN 24 (H) 09/27/2022 1100   CREATININE 0.70 09/27/2022 1100   CREATININE 0.71 01/04/2016 0739   CALCIUM 9.4 09/27/2022 1100   PROT 6.4 (L) 09/27/2022 1100   PROT 6.7 10/03/2021  0918   ALBUMIN 4.1 09/27/2022 1100   AST 13 (L) 09/27/2022 1100   ALT 18 09/27/2022 1100   ALKPHOS 56 09/27/2022 1100   BILITOT 0.5 09/27/2022 1100   GFRNONAA >60 09/27/2022 1100   GFRAA >60 10/23/2019 1004   Lab Results  Component Value Date   WBC 7.7 10/29/2022   NEUTROABS 5.9 10/29/2022   HGB 13.7 10/29/2022   HCT 38.1 10/29/2022   MCV 81.2 10/29/2022   PLT 144 (L) 10/29/2022    Assessment/Plan Glioblastoma, IDH-wildtype ([place])  Yvonne [name] is clinically stable today, now s/p cycle #3 5-day Temodar. MRI at [facility] demonstrated stable findings.  We recommended initiating treatment with cycle #4 Temozolomide 200 mg/m2, on for five days and off for twenty three days in twenty eight day cycles. The patient will have a complete blood count performed on days 21 and 28 of each cycle, and a comprehensive metabolic panel performed on day 28 of each cycle. Labs may need to be performed more often. Zofran will prescribed for home use for nausea/vomiting.   Chemotherapy should be held for the following:  ANC less than 1,000  Platelets less than 100,000  LFT or creatinine greater than 2x ULN  If clinical concerns/contraindications develop  Will con't Vimpat 50mg  BID for seizures.  We did discuss possibility of trial of  nudexta for inappropriate crying spells.  We ask that Yvonne [name] return to clinic in 1 months prior to cycle #5 with MRI brain evaluation, or sooner as needed.  All questions were answered. The patient knows to call the clinic with any problems, questions or concerns. No barriers to learning were detected.  The total time spent in the encounter was 30 minutes and more than 50% was on counseling and review of test results   Ventura Sellers, MD Medical Director of Neuro-Oncology [facility] at [place] 10/29/22 10:56 AM

## 2022-10-30 DIAGNOSIS — H6123 Impacted cerumen, bilateral: Secondary | ICD-10-CM | POA: Diagnosis not present

## 2022-11-01 ENCOUNTER — Telehealth: Payer: Self-pay | Admitting: Dietician

## 2022-11-01 NOTE — Telephone Encounter (Signed)
Third attempt to reach daughter regarding patients difficulty with maintaining patient's nourishment.  Left message to call on voice mail.    April Manson, RDN, LDN Registered Dietitian, [place] Part Time Remote (Usual office hours: Tuesday-Thursday) Mobile: [phone] Remote Office: [phone]

## 2022-11-05 ENCOUNTER — Other Ambulatory Visit: Payer: Self-pay | Admitting: Internal Medicine

## 2022-11-05 MED ORDER — LACOSAMIDE 50 MG PO TABS: 50.0000 mg | ORAL_TABLET | Freq: Two times a day (BID) | ORAL | 3 refills | Status: DC

## 2022-11-06 ENCOUNTER — Other Ambulatory Visit: Payer: Self-pay | Admitting: Internal Medicine

## 2022-11-06 ENCOUNTER — Other Ambulatory Visit (HOSPITAL_COMMUNITY): Payer: Self-pay

## 2022-11-06 DIAGNOSIS — C719 Malignant neoplasm of brain, unspecified: Secondary | ICD-10-CM

## 2022-11-06 MED ORDER — ONDANSETRON HCL 8 MG PO TABS
8.0000 mg | ORAL_TABLET | Freq: Three times a day (TID) | ORAL | 1 refills | Status: DC | PRN
  Filled 2022-11-06: qty 30, 10d supply, fill #0
  Filled 2022-12-31: qty 30, 10d supply, fill #1

## 2022-11-15 ENCOUNTER — Other Ambulatory Visit (HOSPITAL_COMMUNITY): Payer: Self-pay

## 2022-11-19 ENCOUNTER — Telehealth: Payer: Self-pay

## 2022-11-19 NOTE — Telephone Encounter (Signed)
-----   Message from Loretha Stapler, RN sent at 11/16/2021 11:34 AM EDT -----  ----- Message ----- From: Rachael Fee, MD Sent: 11/16/2021  11:30 AM EDT To: Loretha Stapler, RN  Hey, She needs MRI with MRCP in 1 year for pancreatic cyst follow up, pancreatic cancer screening.  Thanks

## 2022-11-19 NOTE — Telephone Encounter (Signed)
Left message on machine to call back  

## 2022-11-20 NOTE — Telephone Encounter (Signed)
Left message on machine to call back  

## 2022-11-21 NOTE — Telephone Encounter (Signed)
I have been unable to reach the pt letter has been mailed.  Also message sent to My Chart

## 2022-11-26 DIAGNOSIS — E039 Hypothyroidism, unspecified: Secondary | ICD-10-CM | POA: Diagnosis not present

## 2022-11-26 DIAGNOSIS — E1140 Type 2 diabetes mellitus with diabetic neuropathy, unspecified: Secondary | ICD-10-CM | POA: Diagnosis not present

## 2022-11-27 ENCOUNTER — Other Ambulatory Visit (HOSPITAL_COMMUNITY): Payer: Self-pay

## 2022-11-28 ENCOUNTER — Other Ambulatory Visit: Payer: Self-pay | Admitting: *Deleted

## 2022-11-28 ENCOUNTER — Inpatient Hospital Stay
Admission: RE | Admit: 2022-11-28 | Discharge: 2022-11-28 | Disposition: A | Discharge status: Home / Self Care | Payer: Self-pay | Source: Ambulatory Visit | Attending: Internal Medicine | Admitting: Internal Medicine

## 2022-11-28 ENCOUNTER — Telehealth: Payer: Self-pay | Admitting: Internal Medicine

## 2022-11-28 ENCOUNTER — Telehealth: Payer: Self-pay | Admitting: *Deleted

## 2022-11-28 DIAGNOSIS — C719 Malignant neoplasm of brain, unspecified: Secondary | ICD-10-CM

## 2022-11-28 DIAGNOSIS — C712 Malignant neoplasm of temporal lobe: Secondary | ICD-10-CM | POA: Diagnosis not present

## 2022-11-28 NOTE — Telephone Encounter (Signed)
Patients daughter called to verify time and date of appointment.

## 2022-11-28 NOTE — Telephone Encounter (Signed)
Copy of demographic sheet faxed to [facility], requested that images from MRI 11/28/22 be pushed to [facility].  Fax confirmation received.

## 2022-11-29 ENCOUNTER — Inpatient Hospital Stay: Payer: Medicare PPO

## 2022-11-29 ENCOUNTER — Other Ambulatory Visit: Payer: Self-pay

## 2022-11-29 ENCOUNTER — Inpatient Hospital Stay: Payer: Medicare PPO | Attending: Internal Medicine

## 2022-11-29 ENCOUNTER — Inpatient Hospital Stay: Payer: Medicare PPO | Admitting: Internal Medicine

## 2022-11-29 ENCOUNTER — Other Ambulatory Visit (HOSPITAL_COMMUNITY): Payer: Self-pay

## 2022-11-29 ENCOUNTER — Inpatient Hospital Stay (HOSPITAL_BASED_OUTPATIENT_CLINIC_OR_DEPARTMENT_OTHER): Payer: Medicare PPO | Admitting: Internal Medicine

## 2022-11-29 VITALS — BP 138/67 | HR 73 | Temp 98.4°F | Resp 16 | Wt 140.0 lb

## 2022-11-29 DIAGNOSIS — C712 Malignant neoplasm of temporal lobe: Secondary | ICD-10-CM

## 2022-11-29 DIAGNOSIS — Z8041 Family history of malignant neoplasm of ovary: Secondary | ICD-10-CM | POA: Insufficient documentation

## 2022-11-29 DIAGNOSIS — I10 Essential (primary) hypertension: Secondary | ICD-10-CM | POA: Diagnosis not present

## 2022-11-29 DIAGNOSIS — C719 Malignant neoplasm of brain, unspecified: Secondary | ICD-10-CM

## 2022-11-29 DIAGNOSIS — Z8049 Family history of malignant neoplasm of other genital organs: Secondary | ICD-10-CM | POA: Diagnosis not present

## 2022-11-29 DIAGNOSIS — E039 Hypothyroidism, unspecified: Secondary | ICD-10-CM | POA: Diagnosis not present

## 2022-11-29 DIAGNOSIS — E785 Hyperlipidemia, unspecified: Secondary | ICD-10-CM | POA: Diagnosis not present

## 2022-11-29 DIAGNOSIS — Z87891 Personal history of nicotine dependence: Secondary | ICD-10-CM | POA: Insufficient documentation

## 2022-11-29 DIAGNOSIS — Z79899 Other long term (current) drug therapy: Secondary | ICD-10-CM | POA: Diagnosis not present

## 2022-11-29 DIAGNOSIS — Z8052 Family history of malignant neoplasm of bladder: Secondary | ICD-10-CM | POA: Insufficient documentation

## 2022-11-29 DIAGNOSIS — E43 Unspecified severe protein-calorie malnutrition: Secondary | ICD-10-CM | POA: Diagnosis not present

## 2022-11-29 DIAGNOSIS — Z803 Family history of malignant neoplasm of breast: Secondary | ICD-10-CM | POA: Diagnosis not present

## 2022-11-29 DIAGNOSIS — Z8042 Family history of malignant neoplasm of prostate: Secondary | ICD-10-CM | POA: Diagnosis not present

## 2022-11-29 DIAGNOSIS — E1140 Type 2 diabetes mellitus with diabetic neuropathy, unspecified: Secondary | ICD-10-CM | POA: Diagnosis not present

## 2022-11-29 LAB — CBC WITH DIFFERENTIAL (CANCER CENTER ONLY)
Abs Immature Granulocytes: 0.02 10*3/uL (ref 0.00–0.07)
Basophils Absolute: 0.1 10*3/uL (ref 0.0–0.1)
Basophils Relative: 1 %
Eosinophils Absolute: 0.3 10*3/uL (ref 0.0–0.5)
Eosinophils Relative: 5 %
HCT: 38.7 % (ref 36.0–46.0)
Hemoglobin: 13.8 g/dL (ref 12.0–15.0)
Immature Granulocytes: 0 %
Lymphocytes Relative: 12 %
Lymphs Abs: 0.8 10*3/uL (ref 0.7–4.0)
MCH: 29.9 pg (ref 26.0–34.0)
MCHC: 35.7 g/dL (ref 30.0–36.0)
MCV: 83.9 fL (ref 80.0–100.0)
Monocytes Absolute: 0.8 10*3/uL (ref 0.1–1.0)
Monocytes Relative: 12 %
Neutro Abs: 4.7 10*3/uL (ref 1.7–7.7)
Neutrophils Relative %: 70 %
Platelet Count: 191 10*3/uL (ref 150–400)
RBC: 4.61 MIL/uL (ref 3.87–5.11)
RDW: 15.6 % — ABNORMAL HIGH (ref 11.5–15.5)
WBC Count: 6.7 10*3/uL (ref 4.0–10.5)
nRBC: 0 % (ref 0.0–0.2)

## 2022-11-29 LAB — CMP (CANCER CENTER ONLY)
ALT: 16 U/L (ref 0–44)
AST: 15 U/L (ref 15–41)
Albumin: 4.6 g/dL (ref 3.5–5.0)
Alkaline Phosphatase: 45 U/L (ref 38–126)
Anion gap: 9 (ref 5–15)
BUN: 29 mg/dL — ABNORMAL HIGH (ref 8–23)
CO2: 25 mmol/L (ref 22–32)
Calcium: 10.2 mg/dL (ref 8.9–10.3)
Chloride: 103 mmol/L (ref 98–111)
Creatinine: 0.75 mg/dL (ref 0.44–1.00)
GFR, Estimated: >60 mL/min (ref >60)
Glucose, Bld: 100 mg/dL — ABNORMAL HIGH (ref 70–99)
Potassium: 3.9 mmol/L (ref 3.5–5.1)
Sodium: 137 mmol/L (ref 135–145)
Total Bilirubin: 0.5 mg/dL (ref 0.3–1.2)
Total Protein: 7.1 g/dL (ref 6.5–8.1)

## 2022-11-29 MED ORDER — LACOSAMIDE 50 MG PO TABS: 50.0000 mg | ORAL_TABLET | Freq: Two times a day (BID) | ORAL | 0 refills | Status: DC

## 2022-11-29 MED ORDER — TEMOZOLOMIDE 100 MG PO CAPS
300.0000 mg | ORAL_CAPSULE | Freq: Every day | ORAL | 0 refills | Status: DC
Start: 2022-11-29 — End: 2022-12-27
  Filled 2022-11-29: qty 15, 28d supply, fill #0
  Filled 2022-11-30: qty 7, 2d supply, fill #0
  Filled 2022-11-30: qty 8, 3d supply, fill #0

## 2022-11-29 NOTE — Progress Notes (Signed)
[facility] [address]  [place], [place] [address] [phone]   Interval Evaluation  Date of Service: 11/29/22 Patient Name: Yvonne [name] Patient MRN: [id number] Patient DOB: 03-10-1946 Provider: Henreitta Leber, MD  Identifying Statement:  Yvonne [name] is a 77 y.o. female with left temporal glioblastoma    Oncologic History: Oncology History  Glioblastoma, IDH-wildtype  04/18/2022 Surgery   Left temporal craniotomy, resection with Dr. Zachery Conch at [facility].  Path is glioblastoma, IDHwt   05/16/2022 -  Chemotherapy   [name] : BRAIN GLIOBLASTOMA Radiation Therapy With Concurrent Temozolomide 75 mg/m2 Daily Followed By Sequential Maintenance Temozolomide x 6-12 cycles       Biomarkers:  MGMT Unknown.  IDH 1/2 Wild type.  EGFR Unknown  TERT Unknown   Interval History: Yvonne [name] presents today for follow up, now having completed cycle #4 of adjuvant Temodar.  Tolerated treatment well overall.  No new symptoms.  She and her daughter describe no significant changes with regards to energy, cognition, speech.  Issues with short term memory, irritable behavior are stable.  Today she inquires about stopping Vimpat.  No further seizures, no headaches.    H+P (05/03/22) Patient presented to medical attention in August 2023 with episode of sudden onset speech impairment.  Initially treated as stroke with TPA, CNS imaging later demonstrated enhancing mass in the left temporal lobe, c/w primary brain tumor.  She underwent resection at [facility] with Dr. Zachery Conch after second opinion there.  Following surgery she experienced some speech impairment; this has improved modestly, but she still experiences difficulty with finding correct words.  Also struggling with short term memory.  She is currently living with her daughter given cognitive limitations, need for some supervision.  No further seizures with the Keppra  twice per  day.  Currently on decadron  daily per discharge taper from [facility].    Medications: Current Outpatient Medications on File Prior to Visit  Medication Sig Dispense Refill   acetaminophen (TYLENOL) 500 MG tablet Take 1,000 mg by mouth every 8 (eight) hours as needed for moderate pain.     Cholecalciferol (VITAMIN D) 50 MCG (2000 UT) tablet Take 2,000 Units by mouth daily.      fluticasone (FLONASE) 50 MCG/ACT nasal spray Place 2 sprays into both nostrils at bedtime.     furosemide (LASIX) 20 MG tablet Take 10 mg by mouth as needed for edema.     gabapentin (NEURONTIN) 300 MG capsule Take 300 mg by mouth at bedtime.      JARDIANCE 10 MG TABS tablet 1 tablet Orally Once a day     lacosamide (VIMPAT) 50 MG TABS tablet Take 1 tablet (50 mg total) by mouth 2 (two) times daily. 60 tablet 3   levothyroxine (SYNTHROID) 88 MCG tablet Take 88 mcg by mouth daily before breakfast.     metFORMIN (GLUCOPHAGE) 500 MG tablet Take 1,000 mg by mouth 2 (two) times daily.     metoprolol succinate (TOPROL-XL) 100 MG 24 hr tablet Take 100 mg by mouth daily.     metroNIDAZOLE (METROCREAM) 0.75 % cream Apply 1 application  topically daily as needed.     ondansetron (ZOFRAN) 8 MG tablet Take 1 tablet (8 mg total) by mouth every 8 (eight) hours as needed for nausea or vomiting. May take 30-60 minutes prior to Temodar administration if nausea/vomiting occurs as needed. 30 tablet 1   prochlorperazine (COMPAZINE) 10 MG tablet Take 1 tablet (  10 mg total) by mouth every 6 (six) hours as needed for nausea or vomiting. 30 tablet 0   rosuvastatin (CRESTOR) 5 MG tablet Take 5 mg by mouth daily.     temozolomide (TEMODAR) 100 MG capsule Take 3 capsules (300 mg total) by mouth daily. Take for 5 days on, 23 days off. Repeat every 28 days. May take on an empty stomach to decrease nausea & vomiting. 15 capsule 0   Current Facility-Administered Medications on File Prior to Visit  Medication Dose Route Frequency Provider Last Rate Last  Admin   influenza vaccine adjuvanted (FLUAD) injection 0.5 mL  0.5 mL Intramuscular Once Marketia Stallsmith, Georgeanna Lea, MD        Allergies:  Allergies  Allergen Reactions   Tobramycin Hives   Sulfa Antibiotics     Stomach upset   Erythromycin Nausea Only   Past Medical History:  Past Medical History:  Diagnosis Date   Anxiety    Chest pressure    Degenerative arthritis    Deviated septum    Diabetes mellitus without complication (HCC)    diet controlled   Diabetic neuropathy (HCC) 12/03/2017   DJD (degenerative joint disease) of knee    Family history of bladder cancer    Family history of brain cancer    Family history of breast cancer    Family history of ovarian cancer    Family history of prostate cancer    Family history of stomach cancer    Family history of throat cancer    Family history of uterine cancer    Graves disease    RADIOACTIVE IODINE 1983   H/O hiatal hernia    Heart palpitations    History of diastolic dysfunction    per echo in 2011   Hyperlipidemia    Hypertension    Hypothyroidism    LVH (left ventricular hypertrophy)    per echo in 2011   Mitral valve prolapse    Neuropathy, peripheral    DR. Anne Hahn 12/15   Normal nuclear stress test 2011   OSA (obstructive sleep apnea)    Polyneuropathy in other diseases classified elsewhere (HCC) 05/21/2013   Rosacea 2019   MELANOMA CHEST REMOVED IN SITU   Sleep difficulties    Tinnitus    Past Surgical History:  Past Surgical History:  Procedure Laterality Date   ABDOMINAL HYSTERECTOMY  2000   TOTAL   APPENDECTOMY  1989   CARDIOVASCULAR STRESS TEST  11/09/2009   EF 75%   CARPAL TUNNEL RELEASE  2002   bilateral   COLONOSCOPY WITH PROPOFOL N/A 10/23/2018   Procedure: COLONOSCOPY WITH PROPOFOL;  Surgeon: Rachael Fee, MD;  Location: [facility];  Service: Endoscopy;  Laterality: N/A;   ESOPHAGOGASTRODUODENOSCOPY N/A 11/16/2021   Procedure: ESOPHAGOGASTRODUODENOSCOPY (EGD);  Surgeon: Rachael Fee, MD;   Location: [facility];  Service: Endoscopy;  Laterality: N/A;   ESOPHAGOGASTRODUODENOSCOPY (EGD) WITH PROPOFOL N/A 10/23/2018   Procedure: ESOPHAGOGASTRODUODENOSCOPY (EGD) WITH PROPOFOL;  Surgeon: Rachael Fee, MD;  Location: [facility];  Service: Endoscopy;  Laterality: N/A;   EUS N/A 10/23/2018   Procedure: UPPER ENDOSCOPIC ULTRASOUND (EUS) RADIAL;  Surgeon: Rachael Fee, MD;  Location: [facility];  Service: Endoscopy;  Laterality: N/A;   EUS N/A 11/16/2021   Procedure: UPPER ENDOSCOPIC ULTRASOUND (EUS) RADIAL;  Surgeon: Rachael Fee, MD;  Location: [facility];  Service: Endoscopy;  Laterality: N/A;   MASS EXCISION Left 06/02/2019   Procedure: EXCISION OF SUBCUTANEOUS ABDOMINAL WALL MASSES LEFT LOWER QUADRANT;  Surgeon:  Manus Rudd, MD;  [facility] OR;  Service: General;  Laterality: Left;   TONSILLECTOMY  1958   TRANSTHORACIC ECHOCARDIOGRAM  11/01/2009   NORMAL LV FILLING AND DIASTOLIC DYSFUNCTION AND MILD AORTIC SCLEROSIS AND TRACE MITRAL REGURGITATION   Social History:  Social History   Socioeconomic History   Marital status: Married    Spouse name: Inocencio Homes   Number of children: 1   Years of education: COLLEGE   Highest education level: Not on file  Occupational History   Occupation: Retired  Tobacco Use   Smoking status: Former    Packs/day: 0.50    Years: 10.00    Additional pack years: 0.00    Total pack years: 5.00    Types: Cigarettes    Quit date: 08/14/1975    Years since quitting: 47.3   Smokeless tobacco: Never  Vaping Use   Vaping Use: Never used  Substance and Sexual Activity   Alcohol use: Yes    Comment: one glass of wine a week   Drug use: No   Sexual activity: Not on file  Other Topics Concern   Not on file  Social History Narrative   Lives at home w/ her husband, Inocencio Homes.   Right-handed.   Drinks no caffeine but does eat chocolate occasionally.   epworth sleepiness scale = 3 (01/10/16)   Social Determinants of Health   Financial  Resource Strain: Medium Risk (06/15/2022)   Overall Financial Resource Strain (CARDIA)    Difficulty of Paying Living Expenses: Somewhat hard  Food Insecurity: No Food Insecurity (06/15/2022)   Hunger Vital Sign    Worried About Running Out of Food in the Last Year: Never true    Ran Out of Food in the Last Year: Never true  Transportation Needs: No Transportation Needs (06/15/2022)   PRAPARE - Administrator, Civil Service (Medical): No    Lack of Transportation (Non-Medical): No  Physical Activity: Not on file  Stress: Not on file  Social Connections: Not on file  Intimate Partner Violence: Not At Risk (06/15/2022)   Humiliation, Afraid, Rape, and Kick questionnaire    Fear of Current or Ex-Partner: No    Emotionally Abused: No    Physically Abused: No    Sexually Abused: No   Family History:  Family History  Problem Relation Age of Onset   Hypertension Mother    Heart disease Mother    Dementia Mother    Heart disease Father    Arthritis Father    Diabetes Father    Hypertension Father    Pancreatic cancer Brother 80       pancreatic   Other Brother        automobile accident   Throat cancer Maternal Uncle    Brain cancer Paternal Aunt 27       malignant brain cancer   Stroke Maternal Grandmother    Heart disease Maternal Grandfather    Pancreatic cancer Cousin 9       pancreatic (maternal first cousin)   Pancreatic cancer Cousin 37       pancreatic, smoker (maternal first cousin)   Breast cancer Cousin 83       breast (maternal first cousin)   Ovarian cancer Cousin    Uterine cancer Cousin 26       uterine (paternal first cousin)   Cancer Cousin 71       unknown primary (paternal first cousin)   Bladder Cancer Cousin 77       (paternal  first cousin)   Lung cancer Cousin    Throat cancer Cousin    Prostate cancer Cousin        (maternal first cousin)   Stomach cancer Cousin 29       (maternal first cousin)   Cancer Other 40       pancreatic  neuroendocrine carcinoma   Colon cancer Neg Hx    Neuropathy Neg Hx     Review of Systems: Constitutional: Doesn't report fevers, chills or abnormal weight loss Eyes: Doesn't report blurriness of vision Ears, nose, mouth, throat, and face: Doesn't report sore throat Respiratory: Doesn't report cough, dyspnea or wheezes Cardiovascular: Doesn't report palpitation, chest discomfort  Gastrointestinal:  Doesn't report nausea, constipation, diarrhea GU: Doesn't report incontinence Skin: Doesn't report skin rashes Neurological: Per HPI Musculoskeletal: Doesn't report joint pain Behavioral/Psych: Doesn't report anxiety  Physical Exam: Vitals:   11/29/22 1348  BP: 138/67  Pulse: 73  Resp: 16  Temp: 98.4 F (36.9 C)  SpO2: 98%   KPS: 70. General: Alert, cooperative, pleasant, in no acute distress Head: Normal EENT: No conjunctival injection or scleral icterus.  Lungs: Resp effort normal Cardiac: Regular rate Abdomen: Non-distended abdomen Skin: No rashes cyanosis or petechiae. Extremities: No clubbing or edema  Neurologic Exam: Mental Status: Awake, alert, attentive to examiner. Oriented to self and environment. Language is fluent but comprehension is markedly impaired.  Anterograde amnesia is noted, mild neglect, elements of agnosia. Cranial Nerves: Visual acuity is grossly normal. Visual fields are full. Extra-ocular movements intact. No ptosis. Face is symmetric Motor: Tone and bulk are normal. Power is full in both arms and legs. Reflexes are symmetric, no pathologic reflexes present.  Sensory: Intact to light touch Gait: Normal.   Labs: I have reviewed the data as listed    Component Value Date/Time   NA 137 11/29/2022 1334   K 3.9 11/29/2022 1334   CL 103 11/29/2022 1334   CO2 25 11/29/2022 1334   GLUCOSE 100 (H) 11/29/2022 1334   BUN 29 (H) 11/29/2022 1334   CREATININE 0.75 11/29/2022 1334   CREATININE 0.71 01/04/2016 0739   CALCIUM 10.2 11/29/2022 1334   PROT  7.1 11/29/2022 1334   PROT 6.7 10/03/2021 0918   ALBUMIN 4.6 11/29/2022 1334   AST 15 11/29/2022 1334   ALT 16 11/29/2022 1334   ALKPHOS 45 11/29/2022 1334   BILITOT 0.5 11/29/2022 1334   GFRNONAA >60 11/29/2022 1334   GFRAA >60 10/23/2019 1004   Lab Results  Component Value Date   WBC 6.7 11/29/2022   NEUTROABS 4.7 11/29/2022   HGB 13.8 11/29/2022   HCT 38.7 11/29/2022   MCV 83.9 11/29/2022   PLT 191 11/29/2022    Assessment/Plan Glioblastoma of temporal lobe  Yvonne [name] is clinically stable today, now s/p cycle #4 5-day Temodar. MRI at [facility] again demonstrated stable findings.  No new or progressive changes.  We recommended initiating treatment with cycle #5 Temozolomide 200 mg/m2, on for five days and off for twenty three days in twenty eight day cycles. The patient will have a complete blood count performed on days 21 and 28 of each cycle, and a comprehensive metabolic panel performed on day 28 of each cycle. Labs may need to be performed more often. Zofran will prescribed for home use for nausea/vomiting.   Chemotherapy should be held for the following:  ANC less than 1,000  Platelets less than 100,000  LFT or creatinine greater than 2x ULN  If clinical concerns/contraindications develop  [facility]  to DC Vimpat after this cycle of Temodar given long term seizure freedom, cognitive issues.  We ask that Yvonne [name] return to clinic in 1 months prior to cycle #6 with labs for evaluation, or sooner as needed.  All questions were answered. The patient knows to call the clinic with any problems, questions or concerns. No barriers to learning were detected.  The total time spent in the encounter was 30 minutes and more than 50% was on counseling and review of test results   Henreitta Leber, MD Medical Director of Neuro-Oncology [facility] at [facility] 11/29/22 2:12 PM

## 2022-11-30 ENCOUNTER — Other Ambulatory Visit: Payer: Self-pay

## 2022-11-30 ENCOUNTER — Other Ambulatory Visit (HOSPITAL_COMMUNITY): Payer: Self-pay

## 2022-12-03 ENCOUNTER — Other Ambulatory Visit (HOSPITAL_COMMUNITY): Payer: Self-pay

## 2022-12-04 ENCOUNTER — Telehealth: Payer: Self-pay | Admitting: Internal Medicine

## 2022-12-04 ENCOUNTER — Other Ambulatory Visit (HOSPITAL_COMMUNITY): Payer: Self-pay

## 2022-12-04 NOTE — Telephone Encounter (Signed)
Called patient regarding May appointments, left a voicemail. ?

## 2022-12-06 ENCOUNTER — Other Ambulatory Visit: Payer: Self-pay | Admitting: Internal Medicine

## 2022-12-18 ENCOUNTER — Other Ambulatory Visit (HOSPITAL_COMMUNITY): Payer: Self-pay

## 2022-12-27 ENCOUNTER — Inpatient Hospital Stay: Payer: Medicare PPO | Attending: Internal Medicine

## 2022-12-27 ENCOUNTER — Inpatient Hospital Stay: Payer: Medicare PPO | Admitting: Internal Medicine

## 2022-12-27 ENCOUNTER — Other Ambulatory Visit: Payer: Self-pay

## 2022-12-27 ENCOUNTER — Other Ambulatory Visit (HOSPITAL_COMMUNITY): Payer: Self-pay

## 2022-12-27 VITALS — BP 133/58 | HR 70 | Temp 97.7°F | Resp 14 | Wt 140.9 lb

## 2022-12-27 DIAGNOSIS — Z8041 Family history of malignant neoplasm of ovary: Secondary | ICD-10-CM | POA: Diagnosis not present

## 2022-12-27 DIAGNOSIS — E1140 Type 2 diabetes mellitus with diabetic neuropathy, unspecified: Secondary | ICD-10-CM | POA: Insufficient documentation

## 2022-12-27 DIAGNOSIS — Z8042 Family history of malignant neoplasm of prostate: Secondary | ICD-10-CM | POA: Diagnosis not present

## 2022-12-27 DIAGNOSIS — Z803 Family history of malignant neoplasm of breast: Secondary | ICD-10-CM | POA: Insufficient documentation

## 2022-12-27 DIAGNOSIS — C719 Malignant neoplasm of brain, unspecified: Secondary | ICD-10-CM | POA: Diagnosis not present

## 2022-12-27 DIAGNOSIS — Z90710 Acquired absence of both cervix and uterus: Secondary | ICD-10-CM | POA: Diagnosis not present

## 2022-12-27 DIAGNOSIS — Z808 Family history of malignant neoplasm of other organs or systems: Secondary | ICD-10-CM | POA: Insufficient documentation

## 2022-12-27 DIAGNOSIS — Z87891 Personal history of nicotine dependence: Secondary | ICD-10-CM | POA: Diagnosis not present

## 2022-12-27 DIAGNOSIS — C712 Malignant neoplasm of temporal lobe: Secondary | ICD-10-CM

## 2022-12-27 DIAGNOSIS — Z8052 Family history of malignant neoplasm of bladder: Secondary | ICD-10-CM | POA: Insufficient documentation

## 2022-12-27 DIAGNOSIS — Z800 Family history of malignant neoplasm of digestive organs: Secondary | ICD-10-CM | POA: Insufficient documentation

## 2022-12-27 DIAGNOSIS — Z8049 Family history of malignant neoplasm of other genital organs: Secondary | ICD-10-CM | POA: Diagnosis not present

## 2022-12-27 LAB — CMP (CANCER CENTER ONLY)
ALT: 19 U/L (ref 0–44)
AST: 14 U/L — ABNORMAL LOW (ref 15–41)
Albumin: 4.6 g/dL (ref 3.5–5.0)
Alkaline Phosphatase: 40 U/L (ref 38–126)
Anion gap: 8 (ref 5–15)
BUN: 32 mg/dL — ABNORMAL HIGH (ref 8–23)
CO2: 32 mmol/L (ref 22–32)
Calcium: 9.9 mg/dL (ref 8.9–10.3)
Chloride: 99 mmol/L (ref 98–111)
Creatinine: 0.73 mg/dL (ref 0.44–1.00)
GFR, Estimated: >60 mL/min (ref >60)
Glucose, Bld: 143 mg/dL — ABNORMAL HIGH (ref 70–99)
Potassium: 4.1 mmol/L (ref 3.5–5.1)
Sodium: 139 mmol/L (ref 135–145)
Total Bilirubin: 0.5 mg/dL (ref 0.3–1.2)
Total Protein: 7 g/dL (ref 6.5–8.1)

## 2022-12-27 LAB — CBC WITH DIFFERENTIAL (CANCER CENTER ONLY)
Abs Immature Granulocytes: 0.03 10*3/uL (ref 0.00–0.07)
Basophils Absolute: 0 10*3/uL (ref 0.0–0.1)
Basophils Relative: 1 %
Eosinophils Absolute: 0.2 10*3/uL (ref 0.0–0.5)
Eosinophils Relative: 4 %
HCT: 42 % (ref 36.0–46.0)
Hemoglobin: 14.1 g/dL (ref 12.0–15.0)
Immature Granulocytes: 1 %
Lymphocytes Relative: 9 %
Lymphs Abs: 0.6 10*3/uL — ABNORMAL LOW (ref 0.7–4.0)
MCH: 29.1 pg (ref 26.0–34.0)
MCHC: 33.6 g/dL (ref 30.0–36.0)
MCV: 86.6 fL (ref 80.0–100.0)
Monocytes Absolute: 0.7 10*3/uL (ref 0.1–1.0)
Monocytes Relative: 10 %
Neutro Abs: 5.1 10*3/uL (ref 1.7–7.7)
Neutrophils Relative %: 75 %
Platelet Count: 145 10*3/uL — ABNORMAL LOW (ref 150–400)
RBC: 4.85 MIL/uL (ref 3.87–5.11)
RDW: 13.7 % (ref 11.5–15.5)
WBC Count: 6.7 10*3/uL (ref 4.0–10.5)
nRBC: 0 % (ref 0.0–0.2)

## 2022-12-27 MED ORDER — TEMOZOLOMIDE 100 MG PO CAPS
300.0000 mg | ORAL_CAPSULE | Freq: Every day | ORAL | 0 refills | Status: DC
Start: 2022-12-27 — End: 2023-08-12
  Filled 2022-12-27: qty 15, 5d supply, fill #0
  Filled 2022-12-27: qty 15, 28d supply, fill #0

## 2022-12-27 NOTE — Progress Notes (Signed)
[facility] [address]  [place], [place] [address] [phone]   Interval Evaluation  Date of Service: 12/27/22 Patient Name: Yvonne [name] Patient MRN: [id number] Patient DOB: [birth year] Provider: Henreitta Leber, MD  Identifying Statement:  Yvonne [name] is a 77 y.o. female with left temporal glioblastoma    Oncologic History: Oncology History  Glioblastoma, IDH-wildtype (HCC)  04/18/2022 Surgery   Left temporal craniotomy, resection with Dr. Zachery Conch at [facility].  Path is glioblastoma, IDHwt   05/16/2022 -  Chemotherapy   [name] : BRAIN GLIOBLASTOMA Radiation Therapy With Concurrent Temozolomide 75 mg/m2 Daily Followed By Sequential Maintenance Temozolomide x 6-12 cycles       Biomarkers:  MGMT Unknown.  IDH 1/2 Wild type.  EGFR Unknown  TERT Unknown   Interval History: Yvonne [name] presents today for follow up, now having completed cycle #5 of adjuvant Temodar.  No new or progressive changes today.  Issues with short term memory, irritable behavior are stable.  Stopped the vimpat last month.  No further seizures, no headaches.    H+P (05/03/22) Patient presented to medical attention in August 2023 with episode of sudden onset speech impairment.  Initially treated as stroke with TPA, CNS imaging later demonstrated enhancing mass in the left temporal lobe, c/w primary brain tumor.  She underwent resection at [facility] with Dr. Zachery Conch after second opinion there.  Following surgery she experienced some speech impairment; this has improved modestly, but she still experiences difficulty with finding correct words.  Also struggling with short term memory.  She is currently living with her daughter given cognitive limitations, need for some supervision.  No further seizures with the Keppra 500mg  twice per day.  Currently on decadron 2mg  daily per discharge taper from [facility].    Medications: Current Outpatient  Medications on File Prior to Visit  Medication Sig Dispense Refill   acetaminophen (TYLENOL) 500 MG tablet Take 1,000 mg by mouth every 8 (eight) hours as needed for moderate pain.     Cholecalciferol (VITAMIN D) 50 MCG (2000 UT) tablet Take 2,000 Units by mouth daily.      fluticasone (FLONASE) 50 MCG/ACT nasal spray Place 2 sprays into both nostrils at bedtime.     furosemide (LASIX) 20 MG tablet Take 10 mg by mouth as needed for edema.     gabapentin (NEURONTIN) 300 MG capsule Take 300 mg by mouth at bedtime.      JARDIANCE 10 MG TABS tablet 1 tablet Orally Once a day     lacosamide (VIMPAT) 50 MG TABS tablet Take 1 tablet (50 mg total) by mouth 2 (two) times daily. 40 tablet 0   levothyroxine (SYNTHROID) 88 MCG tablet Take 88 mcg by mouth daily before breakfast.     metFORMIN (GLUCOPHAGE) 500 MG tablet Take 1,000 mg by mouth 2 (two) times daily.     metoprolol succinate (TOPROL-XL) 100 MG 24 hr tablet Take 100 mg by mouth daily.     metroNIDAZOLE (METROCREAM) 0.75 % cream Apply 1 application  topically daily as needed.     ondansetron (ZOFRAN) 8 MG tablet Take 1 tablet (8 mg total) by mouth every 8 (eight) hours as needed for nausea or vomiting. May take 30-60 minutes prior to Temodar administration if nausea/vomiting occurs as needed. 30 tablet 1   prochlorperazine (COMPAZINE) 10 MG tablet Take 1 tablet (10 mg total) by mouth every 6 (six) hours as needed for nausea or vomiting. 30 tablet  0   rosuvastatin (CRESTOR) 5 MG tablet Take 5 mg by mouth daily.     temozolomide (TEMODAR) 100 MG capsule Take 3 capsules (300 mg total) by mouth daily. Take for 5 days on, 23 days off. Repeat every 28 days. May take on an empty stomach to decrease nausea & vomiting. 15 capsule 0   Current Facility-Administered Medications on File Prior to Visit  Medication Dose Route Frequency Provider Last Rate Last Admin   influenza vaccine adjuvanted (FLUAD) injection 0.5 mL  0.5 mL Intramuscular Once Marion Seese, Georgeanna Lea, MD        Allergies:  Allergies  Allergen Reactions   Tobramycin Hives   Sulfa Antibiotics     Stomach upset   Erythromycin Nausea Only   Past Medical History:  Past Medical History:  Diagnosis Date   Anxiety    Chest pressure    Degenerative arthritis    Deviated septum    Diabetes mellitus without complication (HCC)    diet controlled   Diabetic neuropathy (HCC) 12/03/2017   DJD (degenerative joint disease) of knee    Family history of bladder cancer    Family history of brain cancer    Family history of breast cancer    Family history of ovarian cancer    Family history of prostate cancer    Family history of stomach cancer    Family history of throat cancer    Family history of uterine cancer    Graves disease    RADIOACTIVE IODINE 1983   H/O hiatal hernia    Heart palpitations    History of diastolic dysfunction    per echo in 2011   Hyperlipidemia    Hypertension    Hypothyroidism    LVH (left ventricular hypertrophy)    per echo in 2011   Mitral valve prolapse    Neuropathy, peripheral    DR. Anne Hahn 12/15   Normal nuclear stress test 2011   OSA (obstructive sleep apnea)    Polyneuropathy in other diseases classified elsewhere (HCC) 05/21/2013   Rosacea 2019   MELANOMA CHEST REMOVED IN SITU   Sleep difficulties    Tinnitus    Past Surgical History:  Past Surgical History:  Procedure Laterality Date   ABDOMINAL HYSTERECTOMY  2000   TOTAL   APPENDECTOMY  1989   CARDIOVASCULAR STRESS TEST  11/09/2009   EF 75%   CARPAL TUNNEL RELEASE  2002   bilateral   COLONOSCOPY WITH PROPOFOL N/A 10/23/2018   Procedure: COLONOSCOPY WITH PROPOFOL;  Surgeon: Rachael Fee, MD;  Location: [facility];  Service: Endoscopy;  Laterality: N/A;   ESOPHAGOGASTRODUODENOSCOPY N/A 11/16/2021   Procedure: ESOPHAGOGASTRODUODENOSCOPY (EGD);  Surgeon: Rachael Fee, MD;  Location: [facility];  Service: Endoscopy;  Laterality: N/A;   ESOPHAGOGASTRODUODENOSCOPY (EGD) WITH  PROPOFOL N/A 10/23/2018   Procedure: ESOPHAGOGASTRODUODENOSCOPY (EGD) WITH PROPOFOL;  Surgeon: Rachael Fee, MD;  Location: [facility];  Service: Endoscopy;  Laterality: N/A;   EUS N/A 10/23/2018   Procedure: UPPER ENDOSCOPIC ULTRASOUND (EUS) RADIAL;  Surgeon: Rachael Fee, MD;  Location: [facility];  Service: Endoscopy;  Laterality: N/A;   EUS N/A 11/16/2021   Procedure: UPPER ENDOSCOPIC ULTRASOUND (EUS) RADIAL;  Surgeon: Rachael Fee, MD;  Location: [facility];  Service: Endoscopy;  Laterality: N/A;   MASS EXCISION Left 06/02/2019   Procedure: EXCISION OF SUBCUTANEOUS ABDOMINAL WALL MASSES LEFT LOWER QUADRANT;  Surgeon: Manus Rudd, MD;  Location: [facility];  Service: General;  Laterality: Left;   TONSILLECTOMY  1958   TRANSTHORACIC ECHOCARDIOGRAM  11/01/2009   NORMAL LV FILLING AND DIASTOLIC DYSFUNCTION AND MILD AORTIC SCLEROSIS AND TRACE MITRAL REGURGITATION   Social History:  Social History   Socioeconomic History   Marital status: Married    Spouse name: Inocencio Homes   Number of children: 1   Years of education: COLLEGE   Highest education level: Not on file  Occupational History   Occupation: Retired  Tobacco Use   Smoking status: Former    Packs/day: 0.50    Years: 10.00    Additional pack years: 0.00    Total pack years: 5.00    Types: Cigarettes    Quit date: 08/14/1975    Years since quitting: 47.4   Smokeless tobacco: Never  Vaping Use   Vaping Use: Never used  Substance and Sexual Activity   Alcohol use: Yes    Comment: one glass of wine a week   Drug use: No   Sexual activity: Not on file  Other Topics Concern   Not on file  Social History Narrative   Lives at home w/ her husband, Inocencio Homes.   Right-handed.   Drinks no caffeine but does eat chocolate occasionally.   epworth sleepiness scale = 3 (01/10/16)   Social Determinants of Health   Financial Resource Strain: Medium Risk (06/15/2022)   Overall Financial Resource Strain (CARDIA)    Difficulty of  Paying Living Expenses: Somewhat hard  Food Insecurity: No Food Insecurity (06/15/2022)   Hunger Vital Sign    Worried About Running Out of Food in the Last Year: Never true    Ran Out of Food in the Last Year: Never true  Transportation Needs: No Transportation Needs (06/15/2022)   PRAPARE - Administrator, Civil Service (Medical): No    Lack of Transportation (Non-Medical): No  Physical Activity: Not on file  Stress: Not on file  Social Connections: Not on file  Intimate Partner Violence: Not At Risk (06/15/2022)   Humiliation, Afraid, Rape, and Kick questionnaire    Fear of Current or Ex-Partner: No    Emotionally Abused: No    Physically Abused: No    Sexually Abused: No   Family History:  Family History  Problem Relation Age of Onset   Hypertension Mother    Heart disease Mother    Dementia Mother    Heart disease Father    Arthritis Father    Diabetes Father    Hypertension Father    Pancreatic cancer Brother 77       pancreatic   Other Brother        automobile accident   Throat cancer Maternal Uncle    Brain cancer Paternal Aunt 31       malignant brain cancer   Stroke Maternal Grandmother    Heart disease Maternal Grandfather    Pancreatic cancer Cousin 14       pancreatic (maternal first cousin)   Pancreatic cancer Cousin 101       pancreatic, smoker (maternal first cousin)   Breast cancer Cousin 19       breast (maternal first cousin)   Ovarian cancer Cousin    Uterine cancer Cousin 52       uterine (paternal first cousin)   Cancer Cousin 66       unknown primary (paternal first cousin)   Bladder Cancer Cousin 37       (paternal first cousin)   Lung cancer Cousin    Throat cancer Cousin    Prostate  cancer Cousin        (maternal first cousin)   Stomach cancer Cousin 61       (maternal first cousin)   Cancer Other 70       pancreatic neuroendocrine carcinoma   Colon cancer Neg Hx    Neuropathy Neg Hx     Review of  Systems: Constitutional: Doesn't report fevers, chills or abnormal weight loss Eyes: Doesn't report blurriness of vision Ears, nose, mouth, throat, and face: Doesn't report sore throat Respiratory: Doesn't report cough, dyspnea or wheezes Cardiovascular: Doesn't report palpitation, chest discomfort  Gastrointestinal:  Doesn't report nausea, constipation, diarrhea GU: Doesn't report incontinence Skin: Doesn't report skin rashes Neurological: Per HPI Musculoskeletal: Doesn't report joint pain Behavioral/Psych: Doesn't report anxiety  Physical Exam: Vitals:   12/27/22 1059  BP: (!) 133/58  Pulse: 70  Resp: 14  Temp: 97.7 F (36.5 C)  SpO2: 98%   KPS: 70. General: Alert, cooperative, pleasant, in no acute distress Head: Normal EENT: No conjunctival injection or scleral icterus.  Lungs: Resp effort normal Cardiac: Regular rate Abdomen: Non-distended abdomen Skin: No rashes cyanosis or petechiae. Extremities: No clubbing or edema  Neurologic Exam: Mental Status: Awake, alert, attentive to examiner. Oriented to self and environment. Language is fluent but comprehension is markedly impaired.  Anterograde amnesia is noted, mild neglect, elements of agnosia. Cranial Nerves: Visual acuity is grossly normal. Visual fields are full. Extra-ocular movements intact. No ptosis. Face is symmetric Motor: Tone and bulk are normal. Power is full in both arms and legs. Reflexes are symmetric, no pathologic reflexes present.  Sensory: Intact to light touch Gait: Normal.   Labs: I have reviewed the data as listed    Component Value Date/Time   NA 137 11/29/2022 1334   K 3.9 11/29/2022 1334   CL 103 11/29/2022 1334   CO2 25 11/29/2022 1334   GLUCOSE 100 (H) 11/29/2022 1334   BUN 29 (H) 11/29/2022 1334   CREATININE 0.75 11/29/2022 1334   CREATININE 0.71 01/04/2016 0739   CALCIUM 10.2 11/29/2022 1334   PROT 7.1 11/29/2022 1334   PROT 6.7 10/03/2021 0918   ALBUMIN 4.6 11/29/2022 1334    AST 15 11/29/2022 1334   ALT 16 11/29/2022 1334   ALKPHOS 45 11/29/2022 1334   BILITOT 0.5 11/29/2022 1334   GFRNONAA >60 11/29/2022 1334   GFRAA >60 10/23/2019 1004   Lab Results  Component Value Date   WBC 6.7 12/27/2022   NEUTROABS 5.1 12/27/2022   HGB 14.1 12/27/2022   HCT 42.0 12/27/2022   MCV 86.6 12/27/2022   PLT 145 (L) 12/27/2022    Assessment/Plan Glioblastoma of temporal lobe (HCC)  Yvonne [name] is clinically stable today, now s/p cycle #5 5-day Temodar.  No new or progressive changes today.    No seizures despite Dc'ing vimpat this month.  We recommended initiating treatment with cycle #6 Temozolomide 200 mg/m2, on for five days and off for twenty three days in twenty eight day cycles. The patient will have a complete blood count performed on days 21 and 28 of each cycle, and a comprehensive metabolic panel performed on day 28 of each cycle. Labs may need to be performed more often. Zofran will prescribed for home use for nausea/vomiting.   Chemotherapy should be held for the following:  ANC less than 1,000  Platelets less than 100,000  LFT or creatinine greater than 2x ULN  If clinical concerns/contraindications develop  If interested in return to driving, we would request an occupational  therapy evaluation.  We ask that Yvonne [name] return to clinic in 1 months prior to cycle #7 with labs for evaluation, or sooner as needed.  All questions were answered. The patient knows to call the clinic with any problems, questions or concerns. No barriers to learning were detected.  The total time spent in the encounter was 30 minutes and more than 50% was on counseling and review of test results   Henreitta Leber, MD Medical Director of Neuro-Oncology [facility] 12/27/22 11:02 AM

## 2022-12-29 LAB — FRUCTOSAMINE: Fructosamine: 256 umol/L (ref 0–285)

## 2022-12-31 ENCOUNTER — Other Ambulatory Visit (HOSPITAL_COMMUNITY): Payer: Self-pay

## 2023-01-01 ENCOUNTER — Other Ambulatory Visit (HOSPITAL_COMMUNITY): Payer: Self-pay

## 2023-01-01 ENCOUNTER — Other Ambulatory Visit: Payer: Self-pay

## 2023-01-17 ENCOUNTER — Other Ambulatory Visit (HOSPITAL_COMMUNITY): Payer: Self-pay

## 2023-01-17 DIAGNOSIS — E1140 Type 2 diabetes mellitus with diabetic neuropathy, unspecified: Secondary | ICD-10-CM | POA: Diagnosis not present

## 2023-01-18 DIAGNOSIS — Z6822 Body mass index (BMI) 22.0-22.9, adult: Secondary | ICD-10-CM | POA: Diagnosis not present

## 2023-01-18 DIAGNOSIS — E785 Hyperlipidemia, unspecified: Secondary | ICD-10-CM | POA: Diagnosis not present

## 2023-01-18 DIAGNOSIS — E1142 Type 2 diabetes mellitus with diabetic polyneuropathy: Secondary | ICD-10-CM | POA: Diagnosis not present

## 2023-01-18 DIAGNOSIS — H6123 Impacted cerumen, bilateral: Secondary | ICD-10-CM | POA: Diagnosis not present

## 2023-01-18 DIAGNOSIS — C719 Malignant neoplasm of brain, unspecified: Secondary | ICD-10-CM | POA: Diagnosis not present

## 2023-01-24 ENCOUNTER — Other Ambulatory Visit: Payer: Self-pay

## 2023-01-24 ENCOUNTER — Inpatient Hospital Stay: Payer: Medicare PPO | Attending: Internal Medicine

## 2023-01-24 ENCOUNTER — Inpatient Hospital Stay (HOSPITAL_BASED_OUTPATIENT_CLINIC_OR_DEPARTMENT_OTHER): Payer: Medicare PPO | Admitting: Internal Medicine

## 2023-01-24 VITALS — BP 139/66 | HR 68 | Temp 97.9°F | Wt 139.1 lb

## 2023-01-24 DIAGNOSIS — I10 Essential (primary) hypertension: Secondary | ICD-10-CM | POA: Insufficient documentation

## 2023-01-24 DIAGNOSIS — Z800 Family history of malignant neoplasm of digestive organs: Secondary | ICD-10-CM | POA: Diagnosis not present

## 2023-01-24 DIAGNOSIS — Z8042 Family history of malignant neoplasm of prostate: Secondary | ICD-10-CM | POA: Insufficient documentation

## 2023-01-24 DIAGNOSIS — Z85820 Personal history of malignant melanoma of skin: Secondary | ICD-10-CM | POA: Diagnosis not present

## 2023-01-24 DIAGNOSIS — Z79890 Hormone replacement therapy (postmenopausal): Secondary | ICD-10-CM | POA: Diagnosis not present

## 2023-01-24 DIAGNOSIS — R5381 Other malaise: Secondary | ICD-10-CM | POA: Diagnosis not present

## 2023-01-24 DIAGNOSIS — Z803 Family history of malignant neoplasm of breast: Secondary | ICD-10-CM | POA: Diagnosis not present

## 2023-01-24 DIAGNOSIS — Z923 Personal history of irradiation: Secondary | ICD-10-CM | POA: Insufficient documentation

## 2023-01-24 DIAGNOSIS — C719 Malignant neoplasm of brain, unspecified: Secondary | ICD-10-CM

## 2023-01-24 DIAGNOSIS — Z79630 Long term (current) use of alkylating agent: Secondary | ICD-10-CM | POA: Insufficient documentation

## 2023-01-24 DIAGNOSIS — Z801 Family history of malignant neoplasm of trachea, bronchus and lung: Secondary | ICD-10-CM | POA: Insufficient documentation

## 2023-01-24 DIAGNOSIS — R112 Nausea with vomiting, unspecified: Secondary | ICD-10-CM | POA: Diagnosis not present

## 2023-01-24 DIAGNOSIS — I341 Nonrheumatic mitral (valve) prolapse: Secondary | ICD-10-CM | POA: Diagnosis not present

## 2023-01-24 DIAGNOSIS — Z808 Family history of malignant neoplasm of other organs or systems: Secondary | ICD-10-CM | POA: Insufficient documentation

## 2023-01-24 DIAGNOSIS — Z79899 Other long term (current) drug therapy: Secondary | ICD-10-CM | POA: Insufficient documentation

## 2023-01-24 DIAGNOSIS — C712 Malignant neoplasm of temporal lobe: Secondary | ICD-10-CM | POA: Insufficient documentation

## 2023-01-24 DIAGNOSIS — G4733 Obstructive sleep apnea (adult) (pediatric): Secondary | ICD-10-CM | POA: Insufficient documentation

## 2023-01-24 DIAGNOSIS — Z8041 Family history of malignant neoplasm of ovary: Secondary | ICD-10-CM | POA: Diagnosis not present

## 2023-01-24 DIAGNOSIS — E1140 Type 2 diabetes mellitus with diabetic neuropathy, unspecified: Secondary | ICD-10-CM | POA: Diagnosis not present

## 2023-01-24 DIAGNOSIS — Z87891 Personal history of nicotine dependence: Secondary | ICD-10-CM | POA: Diagnosis not present

## 2023-01-24 DIAGNOSIS — E039 Hypothyroidism, unspecified: Secondary | ICD-10-CM | POA: Diagnosis not present

## 2023-01-24 DIAGNOSIS — Z7984 Long term (current) use of oral hypoglycemic drugs: Secondary | ICD-10-CM | POA: Insufficient documentation

## 2023-01-24 DIAGNOSIS — E785 Hyperlipidemia, unspecified: Secondary | ICD-10-CM | POA: Diagnosis not present

## 2023-01-24 LAB — CMP (CANCER CENTER ONLY)
ALT: 16 U/L (ref 0–44)
AST: 13 U/L — ABNORMAL LOW (ref 15–41)
Albumin: 4.1 g/dL (ref 3.5–5.0)
Alkaline Phosphatase: 40 U/L (ref 38–126)
Anion gap: 6 (ref 5–15)
BUN: 26 mg/dL — ABNORMAL HIGH (ref 8–23)
CO2: 31 mmol/L (ref 22–32)
Calcium: 9.7 mg/dL (ref 8.9–10.3)
Chloride: 105 mmol/L (ref 98–111)
Creatinine: 0.64 mg/dL (ref 0.44–1.00)
GFR, Estimated: >60 mL/min (ref >60)
Glucose, Bld: 152 mg/dL — ABNORMAL HIGH (ref 70–99)
Potassium: 4 mmol/L (ref 3.5–5.1)
Sodium: 142 mmol/L (ref 135–145)
Total Bilirubin: 0.5 mg/dL (ref 0.3–1.2)
Total Protein: 6.7 g/dL (ref 6.5–8.1)

## 2023-01-24 LAB — CBC WITH DIFFERENTIAL (CANCER CENTER ONLY)
Abs Immature Granulocytes: 0.02 10*3/uL (ref 0.00–0.07)
Basophils Absolute: 0 10*3/uL (ref 0.0–0.1)
Basophils Relative: 0 %
Eosinophils Absolute: 0.3 10*3/uL (ref 0.0–0.5)
Eosinophils Relative: 5 %
HCT: 40.7 % (ref 36.0–46.0)
Hemoglobin: 13.7 g/dL (ref 12.0–15.0)
Immature Granulocytes: 0 %
Lymphocytes Relative: 10 %
Lymphs Abs: 0.6 10*3/uL — ABNORMAL LOW (ref 0.7–4.0)
MCH: 28.9 pg (ref 26.0–34.0)
MCHC: 33.7 g/dL (ref 30.0–36.0)
MCV: 85.9 fL (ref 80.0–100.0)
Monocytes Absolute: 0.7 10*3/uL (ref 0.1–1.0)
Monocytes Relative: 12 %
Neutro Abs: 3.8 10*3/uL (ref 1.7–7.7)
Neutrophils Relative %: 73 %
Platelet Count: 97 10*3/uL — ABNORMAL LOW (ref 150–400)
RBC: 4.74 MIL/uL (ref 3.87–5.11)
RDW: 13.2 % (ref 11.5–15.5)
WBC Count: 5.4 10*3/uL (ref 4.0–10.5)
nRBC: 0 % (ref 0.0–0.2)

## 2023-01-24 NOTE — Progress Notes (Signed)
[facility] at [facility] [address]  [place], [place] [address] [phone]   Interval Evaluation  Date of Service: 01/24/23 Patient Name: TERRIL LALLY Patient MRN: [id number] Patient DOB: Feb 22, 1946 Provider: Henreitta Leber, MD  Identifying Statement:  SHATIYA BEDEL is a 77 y.o. female with left temporal glioblastoma    Oncologic History: Oncology History  Glioblastoma, IDH-wildtype (HCC)  04/18/2022 Surgery   Left temporal craniotomy, resection with Dr. Zachery Conch at [facility].  Path is glioblastoma, IDHwt   05/16/2022 -  Chemotherapy   [name] : BRAIN GLIOBLASTOMA Radiation Therapy With Concurrent Temozolomide 75 mg/m2 Daily Followed By Sequential Maintenance Temozolomide x 6-12 cycles       Biomarkers:  MGMT Unknown.  IDH 1/2 Wild type.  EGFR Unknown  TERT Unknown   Interval History: AILEE MEHALIC presents today for follow up, now having completed cycle #6 of adjuvant Temodar.  Still some malaise from Temodar.  Issues with short term memory, irritable behavior are stable.  No further seizures, no headaches.    H+P (05/03/22) Patient presented to medical attention in August 2023 with episode of sudden onset speech impairment.  Initially treated as stroke with TPA, CNS imaging later demonstrated enhancing mass in the left temporal lobe, c/w primary brain tumor.  She underwent resection at [facility] with Dr. Zachery Conch after second opinion there.  Following surgery she experienced some speech impairment; this has improved modestly, but she still experiences difficulty with finding correct words.  Also struggling with short term memory.  She is currently living with her daughter given cognitive limitations, need for some supervision.  No further seizures with the Keppra 500mg  twice per day.  Currently on decadron 2mg  daily per discharge taper from [facility].    Medications: Current Outpatient Medications on File Prior to Visit   Medication Sig Dispense Refill   acetaminophen (TYLENOL) 500 MG tablet Take 1,000 mg by mouth every 8 (eight) hours as needed for moderate pain.     Cholecalciferol (VITAMIN D) 50 MCG (2000 UT) tablet Take 2,000 Units by mouth daily.      fluticasone (FLONASE) 50 MCG/ACT nasal spray Place 2 sprays into both nostrils at bedtime.     furosemide (LASIX) 20 MG tablet Take 10 mg by mouth as needed for edema.     gabapentin (NEURONTIN) 300 MG capsule Take 300 mg by mouth at bedtime.      JARDIANCE 10 MG TABS tablet 1 tablet Orally Once a day     levothyroxine (SYNTHROID) 88 MCG tablet Take 88 mcg by mouth daily before breakfast.     metFORMIN (GLUCOPHAGE) 500 MG tablet Take 500 mg by mouth 2 (two) times daily.     metoprolol succinate (TOPROL-XL) 100 MG 24 hr tablet Take 100 mg by mouth daily.     metroNIDAZOLE (METROCREAM) 0.75 % cream Apply 1 application  topically daily as needed.     ondansetron (ZOFRAN) 8 MG tablet Take 1 tablet by mouth every 8 hours as needed for nausea or vomiting. May take 30 - 60 minutes prior to Temodar administration if nausea/vomiting occurs. 30 tablet 1   prochlorperazine (COMPAZINE) 10 MG tablet Take 1 tablet (10 mg total) by mouth every 6 (six) hours as needed for nausea or vomiting. 30 tablet 0   rosuvastatin (CRESTOR) 5 MG tablet Take 5 mg by mouth daily.     temozolomide (TEMODAR) 100 MG capsule Take 3 capsules (300 mg total) by mouth daily. Take for  5 days on, 23 days off. Repeat every 28 days. May take on an empty stomach to decrease nausea & vomiting. 15 capsule 0   lacosamide (VIMPAT) 50 MG TABS tablet Take 1 tablet (50 mg total) by mouth 2 (two) times daily. 40 tablet 0   Current Facility-Administered Medications on File Prior to Visit  Medication Dose Route Frequency Provider Last Rate Last Admin   influenza vaccine adjuvanted (FLUAD) injection 0.5 mL  0.5 mL Intramuscular Once Tangee Marszalek, Georgeanna Lea, MD        Allergies:  Allergies  Allergen Reactions    Tobramycin Hives   Sulfa Antibiotics     Stomach upset   Erythromycin Nausea Only   Past Medical History:  Past Medical History:  Diagnosis Date   Anxiety    Chest pressure    Degenerative arthritis    Deviated septum    Diabetes mellitus without complication (HCC)    diet controlled   Diabetic neuropathy (HCC) 12/03/2017   DJD (degenerative joint disease) of knee    Family history of bladder cancer    Family history of brain cancer    Family history of breast cancer    Family history of ovarian cancer    Family history of prostate cancer    Family history of stomach cancer    Family history of throat cancer    Family history of uterine cancer    Graves disease    RADIOACTIVE IODINE 1983   H/O hiatal hernia    Heart palpitations    History of diastolic dysfunction    per echo in 2011   Hyperlipidemia    Hypertension    Hypothyroidism    LVH (left ventricular hypertrophy)    per echo in 2011   Mitral valve prolapse    Neuropathy, peripheral    DR. Anne Hahn 12/15   Normal nuclear stress test 2011   OSA (obstructive sleep apnea)    Polyneuropathy in other diseases classified elsewhere (HCC) 05/21/2013   Rosacea 2019   MELANOMA CHEST REMOVED IN SITU   Sleep difficulties    Tinnitus    Past Surgical History:  Past Surgical History:  Procedure Laterality Date   ABDOMINAL HYSTERECTOMY  2000   TOTAL   APPENDECTOMY  1989   CARDIOVASCULAR STRESS TEST  11/09/2009   EF 75%   CARPAL TUNNEL RELEASE  2002   bilateral   COLONOSCOPY WITH PROPOFOL N/A 10/23/2018   Procedure: COLONOSCOPY WITH PROPOFOL;  Surgeon: Rachael Fee, MD;  Location: [facility];  Service: Endoscopy;  Laterality: N/A;   ESOPHAGOGASTRODUODENOSCOPY N/A 11/16/2021   Procedure: ESOPHAGOGASTRODUODENOSCOPY (EGD);  Surgeon: Rachael Fee, MD;  Location: [facility];  Service: Endoscopy;  Laterality: N/A;   ESOPHAGOGASTRODUODENOSCOPY (EGD) WITH PROPOFOL N/A 10/23/2018   Procedure: ESOPHAGOGASTRODUODENOSCOPY  (EGD) WITH PROPOFOL;  Surgeon: Rachael Fee, MD;  Location: [facility];  Service: Endoscopy;  Laterality: N/A;   EUS N/A 10/23/2018   Procedure: UPPER ENDOSCOPIC ULTRASOUND (EUS) RADIAL;  Surgeon: Rachael Fee, MD;  Location: [facility];  Service: Endoscopy;  Laterality: N/A;   EUS N/A 11/16/2021   Procedure: UPPER ENDOSCOPIC ULTRASOUND (EUS) RADIAL;  Surgeon: Rachael Fee, MD;  Location: [facility];  Service: Endoscopy;  Laterality: N/A;   MASS EXCISION Left 06/02/2019   Procedure: EXCISION OF SUBCUTANEOUS ABDOMINAL WALL MASSES LEFT LOWER QUADRANT;  Surgeon: Manus Rudd, MD;  Location: [facility] OR;  Service: General;  Laterality: Left;   TONSILLECTOMY  1958   TRANSTHORACIC ECHOCARDIOGRAM  11/01/2009   NORMAL LV  FILLING AND DIASTOLIC DYSFUNCTION AND MILD AORTIC SCLEROSIS AND TRACE MITRAL REGURGITATION   Social History:  Social History   Socioeconomic History   Marital status: Married    Spouse name: Inocencio Homes   Number of children: 1   Years of education: COLLEGE   Highest education level: Not on file  Occupational History   Occupation: Retired  Tobacco Use   Smoking status: Former    Packs/day: 0.50    Years: 10.00    Additional pack years: 0.00    Total pack years: 5.00    Types: Cigarettes    Quit date: 08/14/1975    Years since quitting: 47.4   Smokeless tobacco: Never  Vaping Use   Vaping Use: Never used  Substance and Sexual Activity   Alcohol use: Yes    Comment: one glass of wine a week   Drug use: No   Sexual activity: Not on file  Other Topics Concern   Not on file  Social History Narrative   Lives at home w/ her husband, Inocencio Homes.   Right-handed.   Drinks no caffeine but does eat chocolate occasionally.   epworth sleepiness scale = 3 (01/10/16)   Social Determinants of Health   Financial Resource Strain: Medium Risk (06/15/2022)   Overall Financial Resource Strain (CARDIA)    Difficulty of Paying Living Expenses: Somewhat hard  Food Insecurity: No Food  Insecurity (06/15/2022)   Hunger Vital Sign    Worried About Running Out of Food in the Last Year: Never true    Ran Out of Food in the Last Year: Never true  Transportation Needs: No Transportation Needs (06/15/2022)   PRAPARE - Administrator, Civil Service (Medical): No    Lack of Transportation (Non-Medical): No  Physical Activity: Not on file  Stress: Not on file  Social Connections: Not on file  Intimate Partner Violence: Not At Risk (06/15/2022)   Humiliation, Afraid, Rape, and Kick questionnaire    Fear of Current or Ex-Partner: No    Emotionally Abused: No    Physically Abused: No    Sexually Abused: No   Family History:  Family History  Problem Relation Age of Onset   Hypertension Mother    Heart disease Mother    Dementia Mother    Heart disease Father    Arthritis Father    Diabetes Father    Hypertension Father    Pancreatic cancer Brother 53       pancreatic   Other Brother        automobile accident   Throat cancer Maternal Uncle    Brain cancer Paternal Aunt 73       malignant brain cancer   Stroke Maternal Grandmother    Heart disease Maternal Grandfather    Pancreatic cancer Cousin 30       pancreatic (maternal first cousin)   Pancreatic cancer Cousin 41       pancreatic, smoker (maternal first cousin)   Breast cancer Cousin 70       breast (maternal first cousin)   Ovarian cancer Cousin    Uterine cancer Cousin 29       uterine (paternal first cousin)   Cancer Cousin 11       unknown primary (paternal first cousin)   Bladder Cancer Cousin 24       (paternal first cousin)   Lung cancer Cousin    Throat cancer Cousin    Prostate cancer Cousin        (maternal first  cousin)   Stomach cancer Cousin 12       (maternal first cousin)   Cancer Other 50       pancreatic neuroendocrine carcinoma   Colon cancer Neg Hx    Neuropathy Neg Hx     Review of Systems: Constitutional: Doesn't report fevers, chills or abnormal weight loss Eyes:  Doesn't report blurriness of vision Ears, nose, mouth, throat, and face: Doesn't report sore throat Respiratory: Doesn't report cough, dyspnea or wheezes Cardiovascular: Doesn't report palpitation, chest discomfort  Gastrointestinal:  Doesn't report nausea, constipation, diarrhea GU: Doesn't report incontinence Skin: Doesn't report skin rashes Neurological: Per HPI Musculoskeletal: Doesn't report joint pain Behavioral/Psych: Doesn't report anxiety  Physical Exam: Vitals:   01/24/23 1121  BP: 139/66  Pulse: 68  Temp: 97.9 F (36.6 C)  SpO2: 98%   KPS: 70. General: Alert, cooperative, pleasant, in no acute distress Head: Normal EENT: No conjunctival injection or scleral icterus.  Lungs: Resp effort normal Cardiac: Regular rate Abdomen: Non-distended abdomen Skin: No rashes cyanosis or petechiae. Extremities: No clubbing or edema  Neurologic Exam: Mental Status: Awake, alert, attentive to examiner. Oriented to self and environment. Language is fluent but comprehension is markedly impaired.  Anterograde amnesia is noted, mild neglect, elements of agnosia. Cranial Nerves: Visual acuity is grossly normal. Visual fields are full. Extra-ocular movements intact. No ptosis. Face is symmetric Motor: Tone and bulk are normal. Power is full in both arms and legs. Reflexes are symmetric, no pathologic reflexes present.  Sensory: Intact to light touch Gait: Normal.   Labs: I have reviewed the data as listed    Component Value Date/Time   NA 142 01/24/2023 1055   K 4.0 01/24/2023 1055   CL 105 01/24/2023 1055   CO2 31 01/24/2023 1055   GLUCOSE 152 (H) 01/24/2023 1055   BUN 26 (H) 01/24/2023 1055   CREATININE 0.64 01/24/2023 1055   CREATININE 0.71 01/04/2016 0739   CALCIUM 9.7 01/24/2023 1055   PROT 6.7 01/24/2023 1055   PROT 6.7 10/03/2021 0918   ALBUMIN 4.1 01/24/2023 1055   AST 13 (L) 01/24/2023 1055   ALT 16 01/24/2023 1055   ALKPHOS 40 01/24/2023 1055   BILITOT 0.5  01/24/2023 1055   GFRNONAA >60 01/24/2023 1055   GFRAA >60 10/23/2019 1004   Lab Results  Component Value Date   WBC 5.4 01/24/2023   NEUTROABS 3.8 01/24/2023   HGB 13.7 01/24/2023   HCT 40.7 01/24/2023   MCV 85.9 01/24/2023   PLT 97 (L) 01/24/2023    Assessment/Plan Glioblastoma, IDH-wildtype (HCC)  NINNIE PERKOWSKI is clinically stable today, now s/p cycle #6 5-day Temodar.  No new or progressive changes today.    Labs demonstrate thrombocytopenia today.  We recommended initiating treatment with cycle #7 Temozolomide 200 mg/m2, on for five days and off for twenty three days in twenty eight day cycles. The patient will have a complete blood count performed on days 21 and 28 of each cycle, and a comprehensive metabolic panel performed on day 28 of each cycle. Labs may need to be performed more often. Zofran will prescribed for home use for nausea/vomiting.   Chemotherapy should be held for the following:  ANC less than 1,000  Platelets less than 100,000  LFT or creatinine greater than 2x ULN  If clinical concerns/contraindications develop  Cycle #7 will be postponed 2 weeks pending improvement in platelet function.    We ask that JOVONA NEAL return to clinic in 2 weeks prior to cycle #  8 with labs for evaluation, or sooner as needed.  All questions were answered. The patient knows to call the clinic with any problems, questions or concerns. No barriers to learning were detected.  The total time spent in the encounter was 30 minutes and more than 50% was on counseling and review of test results   Henreitta Leber, MD Medical Director of Neuro-Oncology [facility] 01/24/23 11:41 AM

## 2023-01-25 ENCOUNTER — Inpatient Hospital Stay
Admission: RE | Admit: 2023-01-25 | Discharge: 2023-01-25 | Disposition: A | Discharge status: Home / Self Care | Payer: Self-pay | Source: Ambulatory Visit | Attending: Internal Medicine | Admitting: Internal Medicine

## 2023-01-25 ENCOUNTER — Other Ambulatory Visit: Payer: Self-pay | Admitting: *Deleted

## 2023-01-25 DIAGNOSIS — C719 Malignant neoplasm of brain, unspecified: Secondary | ICD-10-CM

## 2023-01-28 ENCOUNTER — Telehealth: Payer: Self-pay | Admitting: Internal Medicine

## 2023-01-28 NOTE — Telephone Encounter (Signed)
Scheduled per 06/13 los, patient has been called and voicemail was left. 

## 2023-02-04 ENCOUNTER — Encounter: Payer: Self-pay | Admitting: *Deleted

## 2023-02-04 NOTE — Progress Notes (Signed)
Requested that MRI be pushed to [facility]

## 2023-02-06 DIAGNOSIS — C712 Malignant neoplasm of temporal lobe: Secondary | ICD-10-CM | POA: Diagnosis not present

## 2023-02-06 DIAGNOSIS — Z9889 Other specified postprocedural states: Secondary | ICD-10-CM | POA: Diagnosis not present

## 2023-02-06 DIAGNOSIS — Z87891 Personal history of nicotine dependence: Secondary | ICD-10-CM | POA: Diagnosis not present

## 2023-02-07 ENCOUNTER — Inpatient Hospital Stay: Payer: Medicare PPO

## 2023-02-07 ENCOUNTER — Other Ambulatory Visit: Payer: Self-pay

## 2023-02-07 ENCOUNTER — Inpatient Hospital Stay (HOSPITAL_BASED_OUTPATIENT_CLINIC_OR_DEPARTMENT_OTHER): Payer: Medicare PPO | Admitting: Internal Medicine

## 2023-02-07 VITALS — BP 135/86 | HR 66 | Temp 98.5°F | Resp 17 | Wt 137.9 lb

## 2023-02-07 DIAGNOSIS — E1140 Type 2 diabetes mellitus with diabetic neuropathy, unspecified: Secondary | ICD-10-CM | POA: Diagnosis not present

## 2023-02-07 DIAGNOSIS — I10 Essential (primary) hypertension: Secondary | ICD-10-CM | POA: Diagnosis not present

## 2023-02-07 DIAGNOSIS — C719 Malignant neoplasm of brain, unspecified: Secondary | ICD-10-CM

## 2023-02-07 DIAGNOSIS — C712 Malignant neoplasm of temporal lobe: Secondary | ICD-10-CM | POA: Diagnosis not present

## 2023-02-07 DIAGNOSIS — E785 Hyperlipidemia, unspecified: Secondary | ICD-10-CM | POA: Diagnosis not present

## 2023-02-07 DIAGNOSIS — R112 Nausea with vomiting, unspecified: Secondary | ICD-10-CM | POA: Diagnosis not present

## 2023-02-07 DIAGNOSIS — R5381 Other malaise: Secondary | ICD-10-CM | POA: Diagnosis not present

## 2023-02-07 DIAGNOSIS — G4733 Obstructive sleep apnea (adult) (pediatric): Secondary | ICD-10-CM | POA: Diagnosis not present

## 2023-02-07 DIAGNOSIS — I341 Nonrheumatic mitral (valve) prolapse: Secondary | ICD-10-CM | POA: Diagnosis not present

## 2023-02-07 DIAGNOSIS — E039 Hypothyroidism, unspecified: Secondary | ICD-10-CM | POA: Diagnosis not present

## 2023-02-07 LAB — CMP (CANCER CENTER ONLY)
ALT: 22 U/L (ref 0–44)
AST: 15 U/L (ref 15–41)
Albumin: 4.1 g/dL (ref 3.5–5.0)
Alkaline Phosphatase: 50 U/L (ref 38–126)
Anion gap: 7 (ref 5–15)
BUN: 22 mg/dL (ref 8–23)
CO2: 30 mmol/L (ref 22–32)
Calcium: 10 mg/dL (ref 8.9–10.3)
Chloride: 104 mmol/L (ref 98–111)
Creatinine: 0.64 mg/dL (ref 0.44–1.00)
GFR, Estimated: >60 mL/min (ref >60)
Glucose, Bld: 158 mg/dL — ABNORMAL HIGH (ref 70–99)
Potassium: 4.2 mmol/L (ref 3.5–5.1)
Sodium: 141 mmol/L (ref 135–145)
Total Bilirubin: 0.3 mg/dL (ref 0.3–1.2)
Total Protein: 6.9 g/dL (ref 6.5–8.1)

## 2023-02-07 LAB — CBC WITH DIFFERENTIAL (CANCER CENTER ONLY)
Abs Immature Granulocytes: 0.03 10*3/uL (ref 0.00–0.07)
Basophils Absolute: 0.1 10*3/uL (ref 0.0–0.1)
Basophils Relative: 1 %
Eosinophils Absolute: 0.3 10*3/uL (ref 0.0–0.5)
Eosinophils Relative: 5 %
HCT: 41.1 % (ref 36.0–46.0)
Hemoglobin: 14 g/dL (ref 12.0–15.0)
Immature Granulocytes: 1 %
Lymphocytes Relative: 11 %
Lymphs Abs: 0.7 10*3/uL (ref 0.7–4.0)
MCH: 29 pg (ref 26.0–34.0)
MCHC: 34.1 g/dL (ref 30.0–36.0)
MCV: 85.1 fL (ref 80.0–100.0)
Monocytes Absolute: 0.7 10*3/uL (ref 0.1–1.0)
Monocytes Relative: 11 %
Neutro Abs: 4.2 10*3/uL (ref 1.7–7.7)
Neutrophils Relative %: 71 %
Platelet Count: 157 10*3/uL (ref 150–400)
RBC: 4.83 MIL/uL (ref 3.87–5.11)
RDW: 13.3 % (ref 11.5–15.5)
WBC Count: 5.9 10*3/uL (ref 4.0–10.5)
nRBC: 0 % (ref 0.0–0.2)

## 2023-02-07 NOTE — Progress Notes (Signed)
[facility] [address]  [place], [place] [address] [phone]   Interval Evaluation  Date of Service: 02/07/23 Patient Name: Yvonne [name] Patient MRN: [id number] Patient DOB: 04-29-1946 Provider: Henreitta Leber, MD  Identifying Statement:  Yvonne [name] is a 77 y.o. female with left temporal glioblastoma    Oncologic History: Oncology History  Glioblastoma, IDH-wildtype (HCC)  04/18/2022 Surgery   Left temporal craniotomy, resection with Dr. Zachery Conch at [facility].  Path is glioblastoma, IDHwt   05/16/2022 -  Chemotherapy   [name] : BRAIN GLIOBLASTOMA Radiation Therapy With Concurrent Temozolomide 75 mg/m2 Daily Followed By Sequential Maintenance Temozolomide x 6-12 cycles       Biomarkers:  MGMT Unknown.  IDH 1/2 Wild type.  EGFR Unknown  TERT Unknown   Interval History: Yvonne [name] presents today for follow up, now having completed cycle #7 of adjuvant Temodar.  She feels well this month, language issues have not progressed.  Issues with short term memory, irritable behavior are stable.  No further seizures, no headaches.    H+P (05/03/22) Patient presented to medical attention in August 2023 with episode of sudden onset speech impairment.  Initially treated as stroke with TPA, CNS imaging later demonstrated enhancing mass in the left temporal lobe, c/w primary brain tumor.  She underwent resection at [facility] with Dr. Zachery Conch after second opinion there.  Following surgery she experienced some speech impairment; this has improved modestly, but she still experiences difficulty with finding correct words.  Also struggling with short term memory.  She is currently living with her daughter given cognitive limitations, need for some supervision.  No further seizures with the Keppra 500mg  twice per day.  Currently on decadron 2mg  daily per discharge taper from [facility].    Medications: Current Outpatient Medications on  File Prior to Visit  Medication Sig Dispense Refill   acetaminophen (TYLENOL) 500 MG tablet Take 1,000 mg by mouth every 8 (eight) hours as needed for moderate pain.     Cholecalciferol (VITAMIN D) 50 MCG (2000 UT) tablet Take 2,000 Units by mouth daily.      fluticasone (FLONASE) 50 MCG/ACT nasal spray Place 2 sprays into both nostrils at bedtime.     furosemide (LASIX) 20 MG tablet Take 10 mg by mouth as needed for edema.     gabapentin (NEURONTIN) 300 MG capsule Take 300 mg by mouth at bedtime.      JARDIANCE 10 MG TABS tablet 1 tablet Orally Once a day     levothyroxine (SYNTHROID) 88 MCG tablet Take 88 mcg by mouth daily before breakfast.     metFORMIN (GLUCOPHAGE) 500 MG tablet Take 500 mg by mouth 2 (two) times daily.     metoprolol succinate (TOPROL-XL) 100 MG 24 hr tablet Take 100 mg by mouth daily.     metroNIDAZOLE (METROCREAM) 0.75 % cream Apply 1 application  topically daily as needed.     ondansetron (ZOFRAN) 8 MG tablet Take 1 tablet by mouth every 8 hours as needed for nausea or vomiting. May take 30 - 60 minutes prior to Temodar administration if nausea/vomiting occurs. 30 tablet 1   prochlorperazine (COMPAZINE) 10 MG tablet Take 1 tablet (10 mg total) by mouth every 6 (six) hours as needed for nausea or vomiting. 30 tablet 0   rosuvastatin (CRESTOR) 5 MG tablet Take 5 mg by mouth daily.     temozolomide (TEMODAR) 100 MG capsule Take 3 capsules (300 mg total)  by mouth daily. Take for 5 days on, 23 days off. Repeat every 28 days. May take on an empty stomach to decrease nausea & vomiting. 15 capsule 0   Current Facility-Administered Medications on File Prior to Visit  Medication Dose Route Frequency Provider Last Rate Last Admin   influenza vaccine adjuvanted (FLUAD) injection 0.5 mL  0.5 mL Intramuscular Once Fay Bagg, Georgeanna Lea, MD        Allergies:  Allergies  Allergen Reactions   Tobramycin Hives   Sulfa Antibiotics     Stomach upset   Erythromycin Nausea Only   Past  Medical History:  Past Medical History:  Diagnosis Date   Anxiety    Chest pressure    Degenerative arthritis    Deviated septum    Diabetes mellitus without complication (HCC)    diet controlled   Diabetic neuropathy (HCC) 12/03/2017   DJD (degenerative joint disease) of knee    Family history of bladder cancer    Family history of brain cancer    Family history of breast cancer    Family history of ovarian cancer    Family history of prostate cancer    Family history of stomach cancer    Family history of throat cancer    Family history of uterine cancer    Graves disease    RADIOACTIVE IODINE 1983   H/O hiatal hernia    Heart palpitations    History of diastolic dysfunction    per echo in 2011   Hyperlipidemia    Hypertension    Hypothyroidism    LVH (left ventricular hypertrophy)    per echo in 2011   Mitral valve prolapse    Neuropathy, peripheral    DR. Anne Hahn 12/15   Normal nuclear stress test 2011   OSA (obstructive sleep apnea)    Polyneuropathy in other diseases classified elsewhere (HCC) 05/21/2013   Rosacea 2019   MELANOMA CHEST REMOVED IN SITU   Sleep difficulties    Tinnitus    Past Surgical History:  Past Surgical History:  Procedure Laterality Date   ABDOMINAL HYSTERECTOMY  2000   TOTAL   APPENDECTOMY  1989   CARDIOVASCULAR STRESS TEST  11/09/2009   EF 75%   CARPAL TUNNEL RELEASE  2002   bilateral   COLONOSCOPY WITH PROPOFOL N/A 10/23/2018   Procedure: COLONOSCOPY WITH PROPOFOL;  Surgeon: Rachael Fee, MD;  Location: [facility];  Service: Endoscopy;  Laterality: N/A;   ESOPHAGOGASTRODUODENOSCOPY N/A 11/16/2021   Procedure: ESOPHAGOGASTRODUODENOSCOPY (EGD);  Surgeon: Rachael Fee, MD;  Location: [facility];  Service: Endoscopy;  Laterality: N/A;   ESOPHAGOGASTRODUODENOSCOPY (EGD) WITH PROPOFOL N/A 10/23/2018   Procedure: ESOPHAGOGASTRODUODENOSCOPY (EGD) WITH PROPOFOL;  Surgeon: Rachael Fee, MD;  Location: [facility];  Service:  Endoscopy;  Laterality: N/A;   EUS N/A 10/23/2018   Procedure: UPPER ENDOSCOPIC ULTRASOUND (EUS) RADIAL;  Surgeon: Rachael Fee, MD;  Location: [facility];  Service: Endoscopy;  Laterality: N/A;   EUS N/A 11/16/2021   Procedure: UPPER ENDOSCOPIC ULTRASOUND (EUS) RADIAL;  Surgeon: Rachael Fee, MD;  Location: [facility];  Service: Endoscopy;  Laterality: N/A;   MASS EXCISION Left 06/02/2019   Procedure: EXCISION OF SUBCUTANEOUS ABDOMINAL WALL MASSES LEFT LOWER QUADRANT;  Surgeon: Manus Rudd, MD;  Location: [facility] OR;  Service: General;  Laterality: Left;   TONSILLECTOMY  1958   TRANSTHORACIC ECHOCARDIOGRAM  11/01/2009   NORMAL LV FILLING AND DIASTOLIC DYSFUNCTION AND MILD AORTIC SCLEROSIS AND TRACE MITRAL REGURGITATION   Social History:  Social  History   Socioeconomic History   Marital status: Married    Spouse name: Inocencio Homes   Number of children: 1   Years of education: COLLEGE   Highest education level: Not on file  Occupational History   Occupation: Retired  Tobacco Use   Smoking status: Former    Packs/day: 0.50    Years: 10.00    Additional pack years: 0.00    Total pack years: 5.00    Types: Cigarettes    Quit date: 08/14/1975    Years since quitting: 47.5   Smokeless tobacco: Never  Vaping Use   Vaping Use: Never used  Substance and Sexual Activity   Alcohol use: Yes    Comment: one glass of wine a week   Drug use: No   Sexual activity: Not on file  Other Topics Concern   Not on file  Social History Narrative   Lives at home w/ her husband, Inocencio Homes.   Right-handed.   Drinks no caffeine but does eat chocolate occasionally.   epworth sleepiness scale = 3 (01/10/16)   Social Determinants of Health   Financial Resource Strain: Medium Risk (06/15/2022)   Overall Financial Resource Strain (CARDIA)    Difficulty of Paying Living Expenses: Somewhat hard  Food Insecurity: No Food Insecurity (06/15/2022)   Hunger Vital Sign    Worried About Running Out of Food in the  Last Year: Never true    Ran Out of Food in the Last Year: Never true  Transportation Needs: No Transportation Needs (06/15/2022)   PRAPARE - Administrator, Civil Service (Medical): No    Lack of Transportation (Non-Medical): No  Physical Activity: Not on file  Stress: Not on file  Social Connections: Not on file  Intimate Partner Violence: Not At Risk (06/15/2022)   Humiliation, Afraid, Rape, and Kick questionnaire    Fear of Current or Ex-Partner: No    Emotionally Abused: No    Physically Abused: No    Sexually Abused: No   Family History:  Family History  Problem Relation Age of Onset   Hypertension Mother    Heart disease Mother    Dementia Mother    Heart disease Father    Arthritis Father    Diabetes Father    Hypertension Father    Pancreatic cancer Brother 26       pancreatic   Other Brother        automobile accident   Throat cancer Maternal Uncle    Brain cancer Paternal Aunt 54       malignant brain cancer   Stroke Maternal Grandmother    Heart disease Maternal Grandfather    Pancreatic cancer Cousin 54       pancreatic (maternal first cousin)   Pancreatic cancer Cousin 66       pancreatic, smoker (maternal first cousin)   Breast cancer Cousin 72       breast (maternal first cousin)   Ovarian cancer Cousin    Uterine cancer Cousin 79       uterine (paternal first cousin)   Cancer Cousin 36       unknown primary (paternal first cousin)   Bladder Cancer Cousin 68       (paternal first cousin)   Lung cancer Cousin    Throat cancer Cousin    Prostate cancer Cousin        (maternal first cousin)   Stomach cancer Cousin 21       (maternal first cousin)  Cancer Other 49       pancreatic neuroendocrine carcinoma   Colon cancer Neg Hx    Neuropathy Neg Hx     Review of Systems: Constitutional: Doesn't report fevers, chills or abnormal weight loss Eyes: Doesn't report blurriness of vision Ears, nose, mouth, throat, and face: Doesn't report  sore throat Respiratory: Doesn't report cough, dyspnea or wheezes Cardiovascular: Doesn't report palpitation, chest discomfort  Gastrointestinal:  Doesn't report nausea, constipation, diarrhea GU: Doesn't report incontinence Skin: Doesn't report skin rashes Neurological: Per HPI Musculoskeletal: Doesn't report joint pain Behavioral/Psych: Doesn't report anxiety  Physical Exam: Vitals:   02/07/23 1535  BP: 135/86  Pulse: 66  Resp: 17  Temp: 98.5 F (36.9 C)  SpO2: 100%   KPS: 70. General: Alert, cooperative, pleasant, in no acute distress Head: Normal EENT: No conjunctival injection or scleral icterus.  Lungs: Resp effort normal Cardiac: Regular rate Abdomen: Non-distended abdomen Skin: No rashes cyanosis or petechiae. Extremities: No clubbing or edema  Neurologic Exam: Mental Status: Awake, alert, attentive to examiner. Oriented to self and environment. Language is fluent but comprehension is markedly impaired.  Anterograde amnesia is noted, mild neglect, elements of agnosia. Cranial Nerves: Visual acuity is grossly normal. Visual fields are full. Extra-ocular movements intact. No ptosis. Face is symmetric Motor: Tone and bulk are normal. Power is full in both arms and legs. Reflexes are symmetric, no pathologic reflexes present.  Sensory: Intact to light touch Gait: Normal.   Labs: I have reviewed the data as listed    Component Value Date/Time   NA 141 02/07/2023 1455   K 4.2 02/07/2023 1455   CL 104 02/07/2023 1455   CO2 30 02/07/2023 1455   GLUCOSE 158 (H) 02/07/2023 1455   BUN 22 02/07/2023 1455   CREATININE 0.64 02/07/2023 1455   CREATININE 0.71 01/04/2016 0739   CALCIUM 10.0 02/07/2023 1455   PROT 6.9 02/07/2023 1455   PROT 6.7 10/03/2021 0918   ALBUMIN 4.1 02/07/2023 1455   AST 15 02/07/2023 1455   ALT 22 02/07/2023 1455   ALKPHOS 50 02/07/2023 1455   BILITOT 0.3 02/07/2023 1455   GFRNONAA >60 02/07/2023 1455   GFRAA >60 10/23/2019 1004   Lab  Results  Component Value Date   WBC 5.9 02/07/2023   NEUTROABS 4.2 02/07/2023   HGB 14.0 02/07/2023   HCT 41.1 02/07/2023   MCV 85.1 02/07/2023   PLT 157 02/07/2023    Assessment/Plan Glioblastoma, IDH-wildtype (HCC)  LILLIAH PRIEGO is clinically stable today, now s/p cycle #7 5-day Temodar.  No new or progressive changes today.    MRI demonstrates stable findings, [facility] study.  We discussed and recommended transition to MRI surveillance at this time.  She is agreeable with this.  We ask that Yvonne [name] return to clinic as needed per primary oncology team at [facility].  All questions were answered. The patient knows to call the clinic with any problems, questions or concerns. No barriers to learning were detected.  The total time spent in the encounter was 30 minutes and more than 50% was on counseling and review of test results   Henreitta Leber, MD Medical Director of Neuro-Oncology [facility] [place] 02/07/23 3:54 PM

## 2023-02-09 LAB — FRUCTOSAMINE: Fructosamine: 270 umol/L (ref 0–285)

## 2023-02-11 ENCOUNTER — Other Ambulatory Visit (HOSPITAL_COMMUNITY): Payer: Self-pay

## 2023-02-15 ENCOUNTER — Other Ambulatory Visit (HOSPITAL_COMMUNITY): Payer: Self-pay

## 2023-03-13 DIAGNOSIS — H2513 Age-related nuclear cataract, bilateral: Secondary | ICD-10-CM | POA: Diagnosis not present

## 2023-03-13 DIAGNOSIS — D496 Neoplasm of unspecified behavior of brain: Secondary | ICD-10-CM | POA: Diagnosis not present

## 2023-03-13 DIAGNOSIS — H5340 Unspecified visual field defects: Secondary | ICD-10-CM | POA: Diagnosis not present

## 2023-04-10 DIAGNOSIS — C712 Malignant neoplasm of temporal lobe: Secondary | ICD-10-CM | POA: Diagnosis not present

## 2023-04-24 DIAGNOSIS — L218 Other seborrheic dermatitis: Secondary | ICD-10-CM | POA: Diagnosis not present

## 2023-04-24 DIAGNOSIS — L821 Other seborrheic keratosis: Secondary | ICD-10-CM | POA: Diagnosis not present

## 2023-04-24 DIAGNOSIS — Z85820 Personal history of malignant melanoma of skin: Secondary | ICD-10-CM | POA: Diagnosis not present

## 2023-04-24 DIAGNOSIS — L111 Transient acantholytic dermatosis [Grover]: Secondary | ICD-10-CM | POA: Diagnosis not present

## 2023-04-24 DIAGNOSIS — B353 Tinea pedis: Secondary | ICD-10-CM | POA: Diagnosis not present

## 2023-05-01 DIAGNOSIS — E1140 Type 2 diabetes mellitus with diabetic neuropathy, unspecified: Secondary | ICD-10-CM | POA: Diagnosis not present

## 2023-05-03 DIAGNOSIS — C719 Malignant neoplasm of brain, unspecified: Secondary | ICD-10-CM | POA: Diagnosis not present

## 2023-05-03 DIAGNOSIS — H6123 Impacted cerumen, bilateral: Secondary | ICD-10-CM | POA: Diagnosis not present

## 2023-05-03 DIAGNOSIS — E1140 Type 2 diabetes mellitus with diabetic neuropathy, unspecified: Secondary | ICD-10-CM | POA: Diagnosis not present

## 2023-05-03 DIAGNOSIS — Z23 Encounter for immunization: Secondary | ICD-10-CM | POA: Diagnosis not present

## 2023-05-03 DIAGNOSIS — H9113 Presbycusis, bilateral: Secondary | ICD-10-CM | POA: Diagnosis not present

## 2023-05-03 DIAGNOSIS — Z6823 Body mass index (BMI) 23.0-23.9, adult: Secondary | ICD-10-CM | POA: Diagnosis not present

## 2023-05-17 DIAGNOSIS — H6123 Impacted cerumen, bilateral: Secondary | ICD-10-CM | POA: Diagnosis not present

## 2023-06-12 DIAGNOSIS — Z6823 Body mass index (BMI) 23.0-23.9, adult: Secondary | ICD-10-CM | POA: Diagnosis not present

## 2023-06-12 DIAGNOSIS — K529 Noninfective gastroenteritis and colitis, unspecified: Secondary | ICD-10-CM | POA: Diagnosis not present

## 2023-06-26 DIAGNOSIS — C712 Malignant neoplasm of temporal lobe: Secondary | ICD-10-CM | POA: Diagnosis not present

## 2023-07-02 DIAGNOSIS — D496 Neoplasm of unspecified behavior of brain: Secondary | ICD-10-CM | POA: Diagnosis not present

## 2023-07-02 DIAGNOSIS — Z79899 Other long term (current) drug therapy: Secondary | ICD-10-CM | POA: Diagnosis not present

## 2023-07-02 DIAGNOSIS — E039 Hypothyroidism, unspecified: Secondary | ICD-10-CM | POA: Diagnosis not present

## 2023-07-02 DIAGNOSIS — Z87891 Personal history of nicotine dependence: Secondary | ICD-10-CM | POA: Diagnosis not present

## 2023-07-02 DIAGNOSIS — Z882 Allergy status to sulfonamides status: Secondary | ICD-10-CM | POA: Diagnosis not present

## 2023-07-02 DIAGNOSIS — I10 Essential (primary) hypertension: Secondary | ICD-10-CM | POA: Diagnosis not present

## 2023-07-02 DIAGNOSIS — C712 Malignant neoplasm of temporal lobe: Secondary | ICD-10-CM | POA: Diagnosis not present

## 2023-07-02 DIAGNOSIS — I6789 Other cerebrovascular disease: Secondary | ICD-10-CM | POA: Diagnosis not present

## 2023-07-02 DIAGNOSIS — G4733 Obstructive sleep apnea (adult) (pediatric): Secondary | ICD-10-CM | POA: Diagnosis not present

## 2023-07-02 DIAGNOSIS — Z01818 Encounter for other preprocedural examination: Secondary | ICD-10-CM | POA: Diagnosis not present

## 2023-07-02 DIAGNOSIS — Z881 Allergy status to other antibiotic agents status: Secondary | ICD-10-CM | POA: Diagnosis not present

## 2023-07-02 DIAGNOSIS — Z7984 Long term (current) use of oral hypoglycemic drugs: Secondary | ICD-10-CM | POA: Diagnosis not present

## 2023-07-02 DIAGNOSIS — E119 Type 2 diabetes mellitus without complications: Secondary | ICD-10-CM | POA: Diagnosis not present

## 2023-07-04 DIAGNOSIS — I6789 Other cerebrovascular disease: Secondary | ICD-10-CM | POA: Diagnosis not present

## 2023-07-04 DIAGNOSIS — D496 Neoplasm of unspecified behavior of brain: Secondary | ICD-10-CM | POA: Diagnosis not present

## 2023-07-04 DIAGNOSIS — G4733 Obstructive sleep apnea (adult) (pediatric): Secondary | ICD-10-CM | POA: Diagnosis not present

## 2023-07-22 DIAGNOSIS — Z1231 Encounter for screening mammogram for malignant neoplasm of breast: Secondary | ICD-10-CM | POA: Diagnosis not present

## 2023-07-23 ENCOUNTER — Other Ambulatory Visit: Payer: Self-pay | Admitting: *Deleted

## 2023-07-23 ENCOUNTER — Inpatient Hospital Stay
Admission: RE | Admit: 2023-07-23 | Discharge: 2023-07-23 | Disposition: A | Discharge status: Home / Self Care | Payer: Self-pay | Source: Ambulatory Visit | Attending: Internal Medicine | Admitting: Internal Medicine

## 2023-07-23 ENCOUNTER — Telehealth: Payer: Self-pay | Admitting: *Deleted

## 2023-07-23 DIAGNOSIS — C719 Malignant neoplasm of brain, unspecified: Secondary | ICD-10-CM

## 2023-07-23 NOTE — Telephone Encounter (Signed)
Request for brain MRI images done at [facility] on 02/06/23, 04/10/23, and 06/26/23 to be pushed to [employer].  Fax confirmation received.

## 2023-07-24 DIAGNOSIS — C719 Malignant neoplasm of brain, unspecified: Secondary | ICD-10-CM | POA: Diagnosis not present

## 2023-07-24 DIAGNOSIS — Z0000 Encounter for general adult medical examination without abnormal findings: Secondary | ICD-10-CM | POA: Diagnosis not present

## 2023-07-24 DIAGNOSIS — E785 Hyperlipidemia, unspecified: Secondary | ICD-10-CM | POA: Diagnosis not present

## 2023-07-24 DIAGNOSIS — E1140 Type 2 diabetes mellitus with diabetic neuropathy, unspecified: Secondary | ICD-10-CM | POA: Diagnosis not present

## 2023-07-24 DIAGNOSIS — E039 Hypothyroidism, unspecified: Secondary | ICD-10-CM | POA: Diagnosis not present

## 2023-07-24 DIAGNOSIS — Z1331 Encounter for screening for depression: Secondary | ICD-10-CM | POA: Diagnosis not present

## 2023-07-24 DIAGNOSIS — I10 Essential (primary) hypertension: Secondary | ICD-10-CM | POA: Diagnosis not present

## 2023-07-24 DIAGNOSIS — Z6823 Body mass index (BMI) 23.0-23.9, adult: Secondary | ICD-10-CM | POA: Diagnosis not present

## 2023-07-25 ENCOUNTER — Inpatient Hospital Stay: Payer: Medicare PPO | Attending: Internal Medicine | Admitting: Internal Medicine

## 2023-07-25 VITALS — BP 136/58 | HR 65 | Temp 98.1°F | Resp 18 | Wt 144.8 lb

## 2023-07-25 DIAGNOSIS — G4733 Obstructive sleep apnea (adult) (pediatric): Secondary | ICD-10-CM | POA: Insufficient documentation

## 2023-07-25 DIAGNOSIS — E785 Hyperlipidemia, unspecified: Secondary | ICD-10-CM | POA: Insufficient documentation

## 2023-07-25 DIAGNOSIS — Z7984 Long term (current) use of oral hypoglycemic drugs: Secondary | ICD-10-CM | POA: Insufficient documentation

## 2023-07-25 DIAGNOSIS — Z87891 Personal history of nicotine dependence: Secondary | ICD-10-CM | POA: Insufficient documentation

## 2023-07-25 DIAGNOSIS — Z9221 Personal history of antineoplastic chemotherapy: Secondary | ICD-10-CM | POA: Diagnosis present

## 2023-07-25 DIAGNOSIS — Z8052 Family history of malignant neoplasm of bladder: Secondary | ICD-10-CM | POA: Insufficient documentation

## 2023-07-25 DIAGNOSIS — Z923 Personal history of irradiation: Secondary | ICD-10-CM | POA: Diagnosis present

## 2023-07-25 DIAGNOSIS — Z79630 Long term (current) use of alkylating agent: Secondary | ICD-10-CM | POA: Insufficient documentation

## 2023-07-25 DIAGNOSIS — I10 Essential (primary) hypertension: Secondary | ICD-10-CM | POA: Insufficient documentation

## 2023-07-25 DIAGNOSIS — Z79890 Hormone replacement therapy (postmenopausal): Secondary | ICD-10-CM | POA: Insufficient documentation

## 2023-07-25 DIAGNOSIS — C719 Malignant neoplasm of brain, unspecified: Secondary | ICD-10-CM | POA: Diagnosis not present

## 2023-07-25 DIAGNOSIS — E1140 Type 2 diabetes mellitus with diabetic neuropathy, unspecified: Secondary | ICD-10-CM | POA: Diagnosis not present

## 2023-07-25 DIAGNOSIS — Z79899 Other long term (current) drug therapy: Secondary | ICD-10-CM | POA: Diagnosis not present

## 2023-07-25 DIAGNOSIS — Z8041 Family history of malignant neoplasm of ovary: Secondary | ICD-10-CM | POA: Diagnosis not present

## 2023-07-25 DIAGNOSIS — C712 Malignant neoplasm of temporal lobe: Secondary | ICD-10-CM | POA: Insufficient documentation

## 2023-07-25 DIAGNOSIS — Z803 Family history of malignant neoplasm of breast: Secondary | ICD-10-CM | POA: Insufficient documentation

## 2023-07-25 DIAGNOSIS — Z8042 Family history of malignant neoplasm of prostate: Secondary | ICD-10-CM | POA: Diagnosis not present

## 2023-07-25 DIAGNOSIS — E039 Hypothyroidism, unspecified: Secondary | ICD-10-CM | POA: Insufficient documentation

## 2023-07-25 DIAGNOSIS — I341 Nonrheumatic mitral (valve) prolapse: Secondary | ICD-10-CM | POA: Diagnosis not present

## 2023-07-25 DIAGNOSIS — Z7952 Long term (current) use of systemic steroids: Secondary | ICD-10-CM | POA: Insufficient documentation

## 2023-07-25 DIAGNOSIS — K449 Diaphragmatic hernia without obstruction or gangrene: Secondary | ICD-10-CM | POA: Insufficient documentation

## 2023-07-25 DIAGNOSIS — Z85820 Personal history of malignant melanoma of skin: Secondary | ICD-10-CM | POA: Insufficient documentation

## 2023-07-25 DIAGNOSIS — Z800 Family history of malignant neoplasm of digestive organs: Secondary | ICD-10-CM | POA: Diagnosis not present

## 2023-07-25 MED ORDER — DEXAMETHASONE 4 MG PO TABS: 4.0000 mg | ORAL_TABLET | Freq: Every day | ORAL | 0 refills | Status: DC

## 2023-07-25 NOTE — Progress Notes (Signed)
[facility] [address]  [place], [place] [address] [phone]   Interval Evaluation  Date of Service: 07/25/23 Patient Name: Yvonne [name] Patient MRN: [id number] Patient DOB: 02-May-1946 Provider: Henreitta Leber, MD  Identifying Statement:  Yvonne [name] is a 77 y.o. female with left temporal glioblastoma    Oncologic History: Oncology History  Glioblastoma, IDH-wildtype (HCC)  04/18/2022 Surgery   Left temporal craniotomy, resection with Dr. Zachery Conch at [facility].  Path is glioblastoma, IDHwt   05/16/2022 -  Chemotherapy   [name] : BRAIN GLIOBLASTOMA Radiation Therapy With Concurrent Temozolomide 75 mg/m2 Daily Followed By Sequential Maintenance Temozolomide x 6-12 cycles       Biomarkers:  MGMT Unknown.  IDH 1/2 Wild type.  EGFR Unknown  TERT Unknown   Interval History: Yvonne [name] presents today for follow up after recent biopsy at [facility].  She feels well overall, language issues have not progressed.  Tolerated the procedure without issue.  No longer on steroids.  Issues with short term memory, irritable behavior are stable.  No further seizures, no headaches.    H+P (05/03/22) Patient presented to medical attention in August 2023 with episode of sudden onset speech impairment.  Initially treated as stroke with TPA, CNS imaging later demonstrated enhancing mass in the left temporal lobe, c/w primary brain tumor.  She underwent resection at [facility] with Dr. Zachery Conch after second opinion there.  Following surgery she experienced some speech impairment; this has improved modestly, but she still experiences difficulty with finding correct words.  Also struggling with short term memory.  She is currently living with her daughter given cognitive limitations, need for some supervision.  No further seizures with the Keppra 500mg  twice per day.  Currently on decadron 2mg  daily per discharge taper from [facility].     Medications: Current Outpatient Medications on File Prior to Visit  Medication Sig Dispense Refill   acetaminophen (TYLENOL) 500 MG tablet Take 1,000 mg by mouth every 8 (eight) hours as needed for moderate pain.     Cholecalciferol (VITAMIN D) 50 MCG (2000 UT) tablet Take 2,000 Units by mouth daily.      fluticasone (FLONASE) 50 MCG/ACT nasal spray Place 2 sprays into both nostrils at bedtime.     furosemide (LASIX) 20 MG tablet Take 10 mg by mouth as needed for edema.     gabapentin (NEURONTIN) 300 MG capsule Take 300 mg by mouth at bedtime.      JARDIANCE 10 MG TABS tablet 1 tablet Orally Once a day     levothyroxine (SYNTHROID) 88 MCG tablet Take 88 mcg by mouth daily before breakfast.     metFORMIN (GLUCOPHAGE) 500 MG tablet Take 500 mg by mouth 2 (two) times daily.     metoprolol succinate (TOPROL-XL) 100 MG 24 hr tablet Take 100 mg by mouth daily.     ondansetron (ZOFRAN) 8 MG tablet Take 1 tablet by mouth every 8 hours as needed for nausea or vomiting. May take 30 - 60 minutes prior to Temodar administration if nausea/vomiting occurs. 30 tablet 1   rosuvastatin (CRESTOR) 5 MG tablet Take 5 mg by mouth daily.     metroNIDAZOLE (METROCREAM) 0.75 % cream Apply 1 application  topically daily as needed.     prochlorperazine (COMPAZINE) 10 MG tablet Take 1 tablet (10 mg total) by mouth every 6 (six) hours as needed for nausea or vomiting. (Patient not taking: Reported on 07/25/2023) 30 tablet 0  temozolomide (TEMODAR) 100 MG capsule Take 3 capsules (300 mg total) by mouth daily. Take for 5 days on, 23 days off. Repeat every 28 days. May take on an empty stomach to decrease nausea & vomiting. (Patient not taking: Reported on 07/25/2023) 15 capsule 0   Current Facility-Administered Medications on File Prior to Visit  Medication Dose Route Frequency Provider Last Rate Last Admin   influenza vaccine adjuvanted (FLUAD) injection 0.5 mL  0.5 mL Intramuscular Once Venecia [name], Georgeanna Lea, MD         Allergies:  Allergies  Allergen Reactions   Tobramycin Hives   Sulfa Antibiotics     Stomach upset   Erythromycin Nausea Only   Past Medical History:  Past Medical History:  Diagnosis Date   Anxiety    Chest pressure    Degenerative arthritis    Deviated septum    Diabetes mellitus without complication (HCC)    diet controlled   Diabetic neuropathy (HCC) 12/03/2017   DJD (degenerative joint disease) of knee    Family history of bladder cancer    Family history of brain cancer    Family history of breast cancer    Family history of ovarian cancer    Family history of prostate cancer    Family history of stomach cancer    Family history of throat cancer    Family history of uterine cancer    Graves disease    RADIOACTIVE IODINE 1983   H/O hiatal hernia    Heart palpitations    History of diastolic dysfunction    per echo in 2011   Hyperlipidemia    Hypertension    Hypothyroidism    LVH (left ventricular hypertrophy)    per echo in 2011   Mitral valve prolapse    Neuropathy, peripheral    DR. Anne Hahn 12/15   Normal nuclear stress test 2011   OSA (obstructive sleep apnea)    Polyneuropathy in other diseases classified elsewhere (HCC) 05/21/2013   Rosacea 2019   MELANOMA CHEST REMOVED IN SITU   Sleep difficulties    Tinnitus    Past Surgical History:  Past Surgical History:  Procedure Laterality Date   ABDOMINAL HYSTERECTOMY  2000   TOTAL   APPENDECTOMY  1989   CARDIOVASCULAR STRESS TEST  11/09/2009   EF 75%   CARPAL TUNNEL RELEASE  2002   bilateral   COLONOSCOPY WITH PROPOFOL N/A 10/23/2018   Procedure: COLONOSCOPY WITH PROPOFOL;  Surgeon: Rachael Fee, MD;  Location: [facility];  Service: Endoscopy;  Laterality: N/A;   ESOPHAGOGASTRODUODENOSCOPY N/A 11/16/2021   Procedure: ESOPHAGOGASTRODUODENOSCOPY (EGD);  Surgeon: Rachael Fee, MD;  Location: [facility];  Service: Endoscopy;  Laterality: N/A;   ESOPHAGOGASTRODUODENOSCOPY (EGD) WITH PROPOFOL N/A  10/23/2018   Procedure: ESOPHAGOGASTRODUODENOSCOPY (EGD) WITH PROPOFOL;  Surgeon: Rachael Fee, MD;  Location: [facility];  Service: Endoscopy;  Laterality: N/A;   EUS N/A 10/23/2018   Procedure: UPPER ENDOSCOPIC ULTRASOUND (EUS) RADIAL;  Surgeon: Rachael Fee, MD;  Location: [facility];  Service: Endoscopy;  Laterality: N/A;   EUS N/A 11/16/2021   Procedure: UPPER ENDOSCOPIC ULTRASOUND (EUS) RADIAL;  Surgeon: Rachael Fee, MD;  Location: [facility];  Service: Endoscopy;  Laterality: N/A;   MASS EXCISION Left 06/02/2019   Procedure: EXCISION OF SUBCUTANEOUS ABDOMINAL WALL MASSES LEFT LOWER QUADRANT;  Surgeon: Manus Rudd, MD;  Location: [facility] OR;  Service: General;  Laterality: Left;   TONSILLECTOMY  1958   TRANSTHORACIC ECHOCARDIOGRAM  11/01/2009   NORMAL LV FILLING  AND DIASTOLIC DYSFUNCTION AND MILD AORTIC SCLEROSIS AND TRACE MITRAL REGURGITATION   Social History:  Social History   Socioeconomic History   Marital status: Married    Spouse name: Inocencio Homes   Number of children: 1   Years of education: COLLEGE   Highest education level: Not on file  Occupational History   Occupation: Retired  Tobacco Use   Smoking status: Former    Current packs/day: 0.00    Average packs/day: 0.5 packs/day for 10.0 years (5.0 ttl pk-yrs)    Types: Cigarettes    Start date: 08/13/1965    Quit date: 08/14/1975    Years since quitting: 47.9   Smokeless tobacco: Never  Vaping Use   Vaping status: Never Used  Substance and Sexual Activity   Alcohol use: Yes    Comment: one glass of wine a week   Drug use: No   Sexual activity: Not on file  Other Topics Concern   Not on file  Social History Narrative   Lives at home w/ her husband, Inocencio Homes.   Right-handed.   Drinks no caffeine but does eat chocolate occasionally.   epworth sleepiness scale = 3 (01/10/16)   Social Drivers of Health   Financial Resource Strain: Low Risk  (07/05/2023)   Received from [facility]   Overall  Financial Resource Strain (CARDIA)    Difficulty of Paying Living Expenses: Not hard at all  Food Insecurity: No Food Insecurity (07/05/2023)   Received from [facility]   Hunger Vital Sign    Worried About Running Out of Food in the Last Year: Never true    Ran Out of Food in the Last Year: Never true  Transportation Needs: No Transportation Needs (07/05/2023)   Received from [facility] - Transportation    In the past 12 months, has lack of transportation kept you from medical appointments or from getting medications?: No    Lack of Transportation (Non-Medical): No  Physical Activity: Not on file  Stress: Not on file  Social Connections: Not on file  Intimate Partner Violence: Not At Risk (06/15/2022)   Humiliation, Afraid, Rape, and Kick questionnaire    Fear of Current or Ex-Partner: No    Emotionally Abused: No    Physically Abused: No    Sexually Abused: No   Family History:  Family History  Problem Relation Age of Onset   Hypertension Mother    Heart disease Mother    Dementia Mother    Heart disease Father    Arthritis Father    Diabetes Father    Hypertension Father    Pancreatic cancer Brother 32       pancreatic   Other Brother        automobile accident   Throat cancer Maternal Uncle    Brain cancer Paternal Aunt 37       malignant brain cancer   Stroke Maternal Grandmother    Heart disease Maternal Grandfather    Pancreatic cancer Cousin 25       pancreatic (maternal first cousin)   Pancreatic cancer Cousin 84       pancreatic, smoker (maternal first cousin)   Breast cancer Cousin 46       breast (maternal first cousin)   Ovarian cancer Cousin    Uterine cancer Cousin 76       uterine (paternal first cousin)   Cancer Cousin 45       unknown primary (paternal first cousin)  Bladder Cancer Cousin 97       (paternal first cousin)   Lung cancer Cousin    Throat cancer Cousin    Prostate cancer Cousin         (maternal first cousin)   Stomach cancer Cousin 24       (maternal first cousin)   Cancer Other 9       pancreatic neuroendocrine carcinoma   Colon cancer Neg Hx    Neuropathy Neg Hx     Review of Systems: Constitutional: Doesn't report fevers, chills or abnormal weight loss Eyes: Doesn't report blurriness of vision Ears, nose, mouth, throat, and face: Doesn't report sore throat Respiratory: Doesn't report cough, dyspnea or wheezes Cardiovascular: Doesn't report palpitation, chest discomfort  Gastrointestinal:  Doesn't report nausea, constipation, diarrhea GU: Doesn't report incontinence Skin: Doesn't report skin rashes Neurological: Per HPI Musculoskeletal: Doesn't report joint pain Behavioral/Psych: Doesn't report anxiety  Physical Exam: Vitals:   07/25/23 0910  BP: (!) 136/58  Pulse: 65  Resp: 18  Temp: 98.1 F (36.7 C)  SpO2: 100%   KPS: 70. General: Alert, cooperative, pleasant, in no acute distress Head: Normal EENT: No conjunctival injection or scleral icterus.  Lungs: Resp effort normal Cardiac: Regular rate Abdomen: Non-distended abdomen Skin: No rashes cyanosis or petechiae. Extremities: No clubbing or edema  Neurologic Exam: Mental Status: Awake, alert, attentive to examiner. Oriented to self and environment. Language is fluent but comprehension is markedly impaired.  Anterograde amnesia is noted, mild neglect, elements of agnosia. Cranial Nerves: Visual acuity is grossly normal. Visual fields are full. Extra-ocular movements intact. No ptosis. Face is symmetric Motor: Tone and bulk are normal. Power is full in both arms and legs. Reflexes are symmetric, no pathologic reflexes present.  Sensory: Intact to light touch Gait: Normal.   Labs: I have reviewed the data as listed    Component Value Date/Time   NA 141 02/07/2023 1455   K 4.2 02/07/2023 1455   CL 104 02/07/2023 1455   CO2 30 02/07/2023 1455   GLUCOSE 158 (H) 02/07/2023 1455   BUN 22  02/07/2023 1455   CREATININE 0.64 02/07/2023 1455   CREATININE 0.71 01/04/2016 0739   CALCIUM 10.0 02/07/2023 1455   PROT 6.9 02/07/2023 1455   PROT 6.7 10/03/2021 0918   ALBUMIN 4.1 02/07/2023 1455   AST 15 02/07/2023 1455   ALT 22 02/07/2023 1455   ALKPHOS 50 02/07/2023 1455   BILITOT 0.3 02/07/2023 1455   GFRNONAA >60 02/07/2023 1455   GFRAA >60 10/23/2019 1004   Lab Results  Component Value Date   WBC 5.9 02/07/2023   NEUTROABS 4.2 02/07/2023   HGB 14.0 02/07/2023   HCT 41.1 02/07/2023   MCV 85.1 02/07/2023   PLT 157 02/07/2023    Imaging:  [facility] Clinician Interpretation: I have personally reviewed the CNS images as listed.  My interpretation, in the context of the patient's clinical presentation, is treatment effect vs true progression  No results found.  Pathology: A. Brain Tissue, Biopsy: Mildly cellular brain tissue with hyalinized vessels, necrosis and scattered atypical glia cells, consistent with residual/recurrent glioma with treatment effect. (See comment).   Comment: The patient's prior specimen of left temporal GBM is noted ([id number]). In context, the findings are consistent with a residual/recurrent high-grade glioma with extensive treatment effect and limited viable tumor cells. Clinical and radiologic correlation is suggested. TERT promoter mutation test is being performed to substantiate the diagnosis. The result will be reported separately. Dr. Vallery Ridge was consulted.  Electronically signed by Hal Morales, MD on 07/17/2023 at 11:28 AM    Assessment/Plan Glioblastoma, IDH-wildtype [facility])  Yvonne [name] is clinically stable today, now having completed recent diagnostic biopsy of progressive focus of enhancement adjacent to resection cavity.  Path report is suggestive of radiation necrosis, with small amount of tumor cells present.    Still waiting on recommendation from [facility] team regarding next steps; in the meantime recommended an updated MRI brain  later this month.  Last study was from early November.  She is agreeable with short course of corticosteroids prior to MRI; sometimes this can help ascertain inflammatory change from organic tumor growth.  We ask that Yvonne [name] return to clinic after MRI study, or sooner if needed.  All questions were answered. The patient knows to call the clinic with any problems, questions or concerns. No barriers to learning were detected.  The total time spent in the encounter was 40 minutes and more than 50% was on counseling and review of test results   Henreitta Leber, MD Medical Director of Neuro-Oncology [facility] 07/25/23 9:17 AM

## 2023-07-26 ENCOUNTER — Telehealth: Payer: Self-pay | Admitting: Internal Medicine

## 2023-07-26 NOTE — Telephone Encounter (Signed)
Called Patient no answer. Left message of scheduled appt on 12/30 @ 1230. Provide call back number if need to make other arrangements.

## 2023-07-29 ENCOUNTER — Other Ambulatory Visit (HOSPITAL_COMMUNITY): Payer: Self-pay

## 2023-08-09 ENCOUNTER — Ambulatory Visit (HOSPITAL_COMMUNITY)
Admission: RE | Admit: 2023-08-09 | Discharge: 2023-08-09 | Disposition: A | Discharge status: Home / Self Care | Payer: Medicare PPO | Source: Ambulatory Visit | Attending: Internal Medicine | Admitting: Internal Medicine

## 2023-08-09 DIAGNOSIS — C719 Malignant neoplasm of brain, unspecified: Secondary | ICD-10-CM | POA: Diagnosis not present

## 2023-08-09 DIAGNOSIS — R930 Abnormal findings on diagnostic imaging of skull and head, not elsewhere classified: Secondary | ICD-10-CM | POA: Diagnosis not present

## 2023-08-09 MED ORDER — GADOBUTROL 1 MMOL/ML IV SOLN
6.0000 mL | Freq: Once | INTRAVENOUS | Status: AC | PRN
  Administered 2023-08-09: 6 mL via INTRAVENOUS

## 2023-08-12 ENCOUNTER — Other Ambulatory Visit: Payer: Self-pay | Admitting: *Deleted

## 2023-08-12 ENCOUNTER — Inpatient Hospital Stay: Payer: Medicare PPO | Admitting: Internal Medicine

## 2023-08-12 ENCOUNTER — Inpatient Hospital Stay: Payer: Medicare PPO

## 2023-08-12 VITALS — BP 135/58 | HR 62 | Temp 98.1°F | Resp 18 | Wt 146.6 lb

## 2023-08-12 DIAGNOSIS — E039 Hypothyroidism, unspecified: Secondary | ICD-10-CM | POA: Diagnosis not present

## 2023-08-12 DIAGNOSIS — I10 Essential (primary) hypertension: Secondary | ICD-10-CM | POA: Diagnosis not present

## 2023-08-12 DIAGNOSIS — K449 Diaphragmatic hernia without obstruction or gangrene: Secondary | ICD-10-CM | POA: Diagnosis not present

## 2023-08-12 DIAGNOSIS — C712 Malignant neoplasm of temporal lobe: Secondary | ICD-10-CM

## 2023-08-12 DIAGNOSIS — G4733 Obstructive sleep apnea (adult) (pediatric): Secondary | ICD-10-CM | POA: Diagnosis not present

## 2023-08-12 DIAGNOSIS — C719 Malignant neoplasm of brain, unspecified: Secondary | ICD-10-CM

## 2023-08-12 DIAGNOSIS — E785 Hyperlipidemia, unspecified: Secondary | ICD-10-CM | POA: Diagnosis not present

## 2023-08-12 DIAGNOSIS — E1140 Type 2 diabetes mellitus with diabetic neuropathy, unspecified: Secondary | ICD-10-CM | POA: Diagnosis not present

## 2023-08-12 DIAGNOSIS — I341 Nonrheumatic mitral (valve) prolapse: Secondary | ICD-10-CM | POA: Diagnosis not present

## 2023-08-12 DIAGNOSIS — Z7952 Long term (current) use of systemic steroids: Secondary | ICD-10-CM | POA: Diagnosis not present

## 2023-08-12 LAB — CMP (CANCER CENTER ONLY)
ALT: 12 U/L (ref 0–44)
AST: 10 U/L — ABNORMAL LOW (ref 15–41)
Albumin: 4 g/dL (ref 3.5–5.0)
Alkaline Phosphatase: 45 U/L (ref 38–126)
Anion gap: 5 (ref 5–15)
BUN: 23 mg/dL (ref 8–23)
CO2: 30 mmol/L (ref 22–32)
Calcium: 9.4 mg/dL (ref 8.9–10.3)
Chloride: 102 mmol/L (ref 98–111)
Creatinine: 0.74 mg/dL (ref 0.44–1.00)
GFR, Estimated: >60 mL/min (ref >60)
Glucose, Bld: 178 mg/dL — ABNORMAL HIGH (ref 70–99)
Potassium: 4.1 mmol/L (ref 3.5–5.1)
Sodium: 137 mmol/L (ref 135–145)
Total Bilirubin: 0.4 mg/dL (ref 0.0–1.2)
Total Protein: 6.3 g/dL — ABNORMAL LOW (ref 6.5–8.1)

## 2023-08-12 LAB — CBC WITH DIFFERENTIAL (CANCER CENTER ONLY)
Abs Immature Granulocytes: 0.09 10*3/uL — ABNORMAL HIGH (ref 0.00–0.07)
Basophils Absolute: 0.1 10*3/uL (ref 0.0–0.1)
Basophils Relative: 1 %
Eosinophils Absolute: 0.4 10*3/uL (ref 0.0–0.5)
Eosinophils Relative: 4 %
HCT: 42.2 % (ref 36.0–46.0)
Hemoglobin: 14.2 g/dL (ref 12.0–15.0)
Immature Granulocytes: 1 %
Lymphocytes Relative: 11 %
Lymphs Abs: 1 10*3/uL (ref 0.7–4.0)
MCH: 27.3 pg (ref 26.0–34.0)
MCHC: 33.6 g/dL (ref 30.0–36.0)
MCV: 81 fL (ref 80.0–100.0)
Monocytes Absolute: 1 10*3/uL (ref 0.1–1.0)
Monocytes Relative: 11 %
Neutro Abs: 6.5 10*3/uL (ref 1.7–7.7)
Neutrophils Relative %: 72 %
Platelet Count: 165 10*3/uL (ref 150–400)
RBC: 5.21 MIL/uL — ABNORMAL HIGH (ref 3.87–5.11)
RDW: 14.3 % (ref 11.5–15.5)
WBC Count: 9 10*3/uL (ref 4.0–10.5)
nRBC: 0 % (ref 0.0–0.2)

## 2023-08-12 NOTE — Progress Notes (Signed)
[facility] at [facility] [name]  [place], [place] [address] [phone]   Interval Evaluation  Date of Service: 08/12/23 Patient Name: Yvonne [name] Patient MRN: [id number] Patient DOB: July 27, 1946 Provider: Henreitta Leber, MD  Identifying Statement:  Yvonne [name] is a 77 y.o. female with left temporal glioblastoma    Oncologic History: Oncology History  Glioblastoma, IDH-wildtype (HCC)  04/18/2022 Surgery   Left temporal craniotomy, resection with Dr. Zachery Conch at [facility].  Path is glioblastoma, IDHwt   05/16/2022 -  Chemotherapy   [name] : BRAIN GLIOBLASTOMA Radiation Therapy With Concurrent Temozolomide 75 mg/m2 Daily Followed By Sequential Maintenance Temozolomide x 6-12 cycles       Biomarkers:  MGMT Unknown.  IDH 1/2 Wild type.  EGFR Unknown  TERT Unknown   Interval History: Yvonne [name] presents today for follow up after recent MRI brain.  She feels well overall, language issues have not progressed.  Steroid course, now completed, did interfere with her sleep.  Issues with short term memory, irritable behavior are stable.  No further seizures, no headaches.    H+P (05/03/22) Patient presented to medical attention in August 2023 with episode of sudden onset speech impairment.  Initially treated as stroke with TPA, CNS imaging later demonstrated enhancing mass in the left temporal lobe, c/w primary brain tumor.  She underwent resection at [facility] with Dr. Zachery Conch after second opinion there.  Following surgery she experienced some speech impairment; this has improved modestly, but she still experiences difficulty with finding correct words.  Also struggling with short term memory.  She is currently living with her daughter given cognitive limitations, need for some supervision.  No further seizures with the Keppra 500mg  twice per day.  Currently on decadron 2mg  daily per discharge taper from [facility]     Medications: Current Outpatient Medications on File Prior to Visit  Medication Sig Dispense Refill   acetaminophen (TYLENOL) 500 MG tablet Take 1,000 mg by mouth every 8 (eight) hours as needed for moderate pain.     Cholecalciferol (VITAMIN D) 50 MCG (2000 UT) tablet Take 2,000 Units by mouth daily.      fluticasone (FLONASE) 50 MCG/ACT nasal spray Place 2 sprays into both nostrils at bedtime.     furosemide (LASIX) 20 MG tablet Take 10 mg by mouth as needed for edema.     gabapentin (NEURONTIN) 300 MG capsule Take 300 mg by mouth at bedtime.      JARDIANCE 10 MG TABS tablet 1 tablet Orally Once a day     levothyroxine (SYNTHROID) 88 MCG tablet Take 88 mcg by mouth daily before breakfast.     metFORMIN (GLUCOPHAGE) 500 MG tablet Take 500 mg by mouth 2 (two) times daily.     metoprolol succinate (TOPROL-XL) 100 MG 24 hr tablet Take 100 mg by mouth daily.     metroNIDAZOLE (METROCREAM) 0.75 % cream Apply 1 application  topically daily as needed.     ondansetron (ZOFRAN) 8 MG tablet Take 1 tablet by mouth every 8 hours as needed for nausea or vomiting. May take 30 - 60 minutes prior to Temodar administration if nausea/vomiting occurs. 30 tablet 1   prochlorperazine (COMPAZINE) 10 MG tablet Take 1 tablet (10 mg total) by mouth every 6 (six) hours as needed for nausea or vomiting. 30 tablet 0   rosuvastatin (CRESTOR) 5 MG tablet Take 5 mg by mouth daily.     Current Facility-Administered Medications on File Prior  to Visit  Medication Dose Route Frequency Provider Last Rate Last Admin   influenza vaccine adjuvanted (FLUAD) injection 0.5 mL  0.5 mL Intramuscular Once Lennix Kneisel, Georgeanna Lea, MD        Allergies:  Allergies  Allergen Reactions   Tobramycin Hives   Sulfa Antibiotics     Stomach upset   Erythromycin Nausea Only   Past Medical History:  Past Medical History:  Diagnosis Date   Anxiety    Chest pressure    Degenerative arthritis    Deviated septum    Diabetes mellitus without  complication (HCC)    diet controlled   Diabetic neuropathy (HCC) 12/03/2017   DJD (degenerative joint disease) of knee    Family history of bladder cancer    Family history of brain cancer    Family history of breast cancer    Family history of ovarian cancer    Family history of prostate cancer    Family history of stomach cancer    Family history of throat cancer    Family history of uterine cancer    Graves disease    RADIOACTIVE IODINE 1983   H/O hiatal hernia    Heart palpitations    History of diastolic dysfunction    per echo in 2011   Hyperlipidemia    Hypertension    Hypothyroidism    LVH (left ventricular hypertrophy)    per echo in 2011   Mitral valve prolapse    Neuropathy, peripheral    DR. Anne Hahn 12/15   Normal nuclear stress test 2011   OSA (obstructive sleep apnea)    Polyneuropathy in other diseases classified elsewhere (HCC) 05/21/2013   Rosacea 2019   MELANOMA CHEST REMOVED IN SITU   Sleep difficulties    Tinnitus    Past Surgical History:  Past Surgical History:  Procedure Laterality Date   ABDOMINAL HYSTERECTOMY  2000   TOTAL   APPENDECTOMY  1989   CARDIOVASCULAR STRESS TEST  11/09/2009   EF 75%   CARPAL TUNNEL RELEASE  2002   bilateral   COLONOSCOPY WITH PROPOFOL N/A 10/23/2018   Procedure: COLONOSCOPY WITH PROPOFOL;  Surgeon: Rachael Fee, MD;  Location: [facility];  Service: Endoscopy;  Laterality: N/A;   ESOPHAGOGASTRODUODENOSCOPY N/A 11/16/2021   Procedure: ESOPHAGOGASTRODUODENOSCOPY (EGD);  Surgeon: Rachael Fee, MD;  Location: [facility];  Service: Endoscopy;  Laterality: N/A;   ESOPHAGOGASTRODUODENOSCOPY (EGD) WITH PROPOFOL N/A 10/23/2018   Procedure: ESOPHAGOGASTRODUODENOSCOPY (EGD) WITH PROPOFOL;  Surgeon: Rachael Fee, MD;  Location: [facility];  Service: Endoscopy;  Laterality: N/A;   EUS N/A 10/23/2018   Procedure: UPPER ENDOSCOPIC ULTRASOUND (EUS) RADIAL;  Surgeon: Rachael Fee, MD;  Location: [facility];  Service:  Endoscopy;  Laterality: N/A;   EUS N/A 11/16/2021   Procedure: UPPER ENDOSCOPIC ULTRASOUND (EUS) RADIAL;  Surgeon: Rachael Fee, MD;  Location: [facility];  Service: Endoscopy;  Laterality: N/A;   MASS EXCISION Left 06/02/2019   Procedure: EXCISION OF SUBCUTANEOUS ABDOMINAL WALL MASSES LEFT LOWER QUADRANT;  Surgeon: Manus Rudd, MD;  Location: [facility] OR;  Service: General;  Laterality: Left;   TONSILLECTOMY  1958   TRANSTHORACIC ECHOCARDIOGRAM  11/01/2009   NORMAL LV FILLING AND DIASTOLIC DYSFUNCTION AND MILD AORTIC SCLEROSIS AND TRACE MITRAL REGURGITATION   Social History:  Social History   Socioeconomic History   Marital status: Married    Spouse name: Inocencio Homes   Number of children: 1   Years of education: COLLEGE   Highest education level: Not on file  Occupational History   Occupation: Retired  Tobacco Use   Smoking status: Former    Current packs/day: 0.00    Average packs/day: 0.5 packs/day for 10.0 years (5.0 ttl pk-yrs)    Types: Cigarettes    Start date: 08/13/1965    Quit date: 08/14/1975    Years since quitting: 48.0   Smokeless tobacco: Never  Vaping Use   Vaping status: Never Used  Substance and Sexual Activity   Alcohol use: Yes    Comment: one glass of wine a week   Drug use: No   Sexual activity: Not on file  Other Topics Concern   Not on file  Social History Narrative   Lives at home w/ her husband, Inocencio Homes.   Right-handed.   Drinks no caffeine but does eat chocolate occasionally.   epworth sleepiness scale = 3 (01/10/16)   Social Drivers of Health   Financial Resource Strain: Low Risk  (07/05/2023)   [facility]   Overall Financial Resource Strain (CARDIA)    Difficulty of Paying Living Expenses: Not hard at all  Food Insecurity: No Food Insecurity (07/05/2023)   Received from [facility]   Hunger Vital Sign    Worried About Running Out of Food in the Last Year: Never true    Ran Out of Food in the Last  Year: Never true  Transportation Needs: No Transportation Needs (07/05/2023)   Received from [facility] - Transportation    In the past 12 months, has lack of transportation kept you from medical appointments or from getting medications?: No    Lack of Transportation (Non-Medical): No  Physical Activity: Not on file  Stress: Not on file  Social Connections: Not on file  Intimate Partner Violence: Not At Risk (06/15/2022)   Humiliation, Afraid, Rape, and Kick questionnaire    Fear of Current or Ex-Partner: No    Emotionally Abused: No    Physically Abused: No    Sexually Abused: No   Family History:  Family History  Problem Relation Age of Onset   Hypertension Mother    Heart disease Mother    Dementia Mother    Heart disease Father    Arthritis Father    Diabetes Father    Hypertension Father    Pancreatic cancer Brother 88       pancreatic   Other Brother        automobile accident   Throat cancer Maternal Uncle    Brain cancer Paternal Aunt 98       malignant brain cancer   Stroke Maternal Grandmother    Heart disease Maternal Grandfather    Pancreatic cancer Cousin 72       pancreatic (maternal first cousin)   Pancreatic cancer Cousin 58       pancreatic, smoker (maternal first cousin)   Breast cancer Cousin 32       breast (maternal first cousin)   Ovarian cancer Cousin    Uterine cancer Cousin 74       uterine (paternal first cousin)   Cancer Cousin 52       unknown primary (paternal first cousin)   Bladder Cancer Cousin 55       (paternal first cousin)   Lung cancer Cousin    Throat cancer Cousin    Prostate cancer Cousin        (maternal first cousin)   Stomach cancer Cousin 28       (  maternal first cousin)   Cancer Other 43       pancreatic neuroendocrine carcinoma   Colon cancer Neg Hx    Neuropathy Neg Hx     Review of Systems: Constitutional: Doesn't report fevers, chills or abnormal weight loss Eyes: Doesn't report  blurriness of vision Ears, nose, mouth, throat, and face: Doesn't report sore throat Respiratory: Doesn't report cough, dyspnea or wheezes Cardiovascular: Doesn't report palpitation, chest discomfort  Gastrointestinal:  Doesn't report nausea, constipation, diarrhea GU: Doesn't report incontinence Skin: Doesn't report skin rashes Neurological: Per HPI Musculoskeletal: Doesn't report joint pain Behavioral/Psych: Doesn't report anxiety  Physical Exam: Vitals:   08/12/23 1243  BP: (!) 135/58  Pulse: 62  Resp: 18  Temp: 98.1 F (36.7 C)  SpO2: 100%   KPS: 70. General: Alert, cooperative, pleasant, in no acute distress Head: Normal EENT: No conjunctival injection or scleral icterus.  Lungs: Resp effort normal Cardiac: Regular rate Abdomen: Non-distended abdomen Skin: No rashes cyanosis or petechiae. Extremities: No clubbing or edema  Neurologic Exam: Mental Status: Awake, alert, attentive to examiner. Oriented to self and environment. Language is fluent but comprehension is markedly impaired.  Anterograde amnesia is noted, mild neglect, elements of agnosia. Cranial Nerves: Visual acuity is grossly normal. Visual fields are full. Extra-ocular movements intact. No ptosis. Face is symmetric Motor: Tone and bulk are normal. Power is full in both arms and legs. Reflexes are symmetric, no pathologic reflexes present.  Sensory: Intact to light touch Gait: Normal.   Labs: I have reviewed the data as listed    Component Value Date/Time   NA 137 08/12/2023 1201   K 4.1 08/12/2023 1201   CL 102 08/12/2023 1201   CO2 30 08/12/2023 1201   GLUCOSE 178 (H) 08/12/2023 1201   BUN 23 08/12/2023 1201   CREATININE 0.74 08/12/2023 1201   CREATININE 0.71 01/04/2016 0739   CALCIUM 9.4 08/12/2023 1201   PROT 6.3 (L) 08/12/2023 1201   PROT 6.7 10/03/2021 0918   ALBUMIN 4.0 08/12/2023 1201   AST 10 (L) 08/12/2023 1201   ALT 12 08/12/2023 1201   ALKPHOS 45 08/12/2023 1201   BILITOT 0.4  08/12/2023 1201   GFRNONAA >60 08/12/2023 1201   GFRAA >60 10/23/2019 1004   Lab Results  Component Value Date   WBC 9.0 08/12/2023   NEUTROABS 6.5 08/12/2023   HGB 14.2 08/12/2023   HCT 42.2 08/12/2023   MCV 81.0 08/12/2023   PLT 165 08/12/2023    Imaging:  [facility] Clinician Interpretation: I have personally reviewed the CNS images as listed.  My interpretation, in the context of the patient's clinical presentation, is treatment effect vs true progression  MR BRAIN W WO CONTRAST Result Date: 08/09/2023 CLINICAL DATA:  Brain/CNS neoplasm, assess treatment response. GBM, IDH wild-type. EXAM: MRI HEAD WITHOUT AND WITH CONTRAST TECHNIQUE: Multiplanar, multiecho pulse sequences of the brain and surrounding structures were obtained without and with intravenous contrast. CONTRAST:  That 6mL GADAVIST GADOBUTROL 1 MMOL/ML IV SOLN COMPARISON:  MRI head without and with contrast from [facility] 06/26/2023 FINDINGS: Brain: The patient is status post resection left temporal tip tumor. A she is focus of nodular enhancement along the posterior margin of the resection cavity and lateral left lateral ventricle has increased slightly in size since the prior study, now measuring 15 x 8 x 6 mm. The lesion previously measured 11 x 6 mm in similar planes. Surrounding T2 and FLAIR signal hyperintensity is stable. No new foci of enhancement or progressive T2  signal change is present. The right basal ganglia are within normal limits. The brainstem and cerebellum are normal. Vascular: Flow is present in the major intracranial arteries. Skull and upper cervical spine: The craniocervical junction is normal. Upper cervical spine is within normal limits. Marrow signal is unremarkable. Sinuses/Orbits: Chronic left sphenoid sinus opacification is again noted. Moderate mucosal thickening is present in the inferior right maxillary sinus. Left mastoid effusion is present. No obstructing nasopharyngeal lesion is  present. The paranasal sinuses and mastoid air cells are otherwise clear. The globes and orbits are within normal limits. IMPRESSION: 1. Slight interval increase in size of nodular enhancement along the posterior margin of the resection cavity and lateral left lateral ventricle. This is concerning for progressive disease. 2. Stable surrounding T2 and FLAIR signal hyperintensity. 3. No new foci of enhancement or progressive T2 signal change. 4. Chronic left sphenoid sinus opacification. 5. Moderate mucosal thickening in the inferior right maxillary sinus. 6. Left mastoid effusion. No obstructing nasopharyngeal lesion is present. Electronically Signed   By: Marin Roberts M.D.   On: 08/09/2023 14:37    Pathology: A. Brain Tissue, Biopsy: Mildly cellular brain tissue with hyalinized vessels, necrosis and scattered atypical glia cells, consistent with residual/recurrent glioma with treatment effect. (See comment).   Comment: The patient's prior specimen of left temporal GBM is noted ([id number]). In context, the findings are consistent with a residual/recurrent high-grade glioma with extensive treatment effect and limited viable tumor cells. Clinical and radiologic correlation is suggested. TERT promoter mutation test is being performed to substantiate the diagnosis. The result will be reported separately. Dr. Vallery Ridge was consulted.  Electronically signed by Hal Morales, MD on 07/17/2023 at 11:28 AM    Assessment/Plan Glioblastoma, IDH-wildtype [facility]) - Plan: MR BRAIN W WO CONTRAST  Glioblastoma of temporal lobe (HCC)  Yvonne [name] is clinically stable today.  MRI brain demonstrates modest increase in enhancement within recently biopsied left temporal nodular enhancing focus.  Pathology demonstrated radiation necrosis; growth can be seen over several months with RN, although tumor remains a possibility.  Recommended continued close surveillance, with repeat MRI brain in ~6 weeks.  Will  defer resumption of Temodar.  We ask that Yvonne [name] return to clinic in 1 months following next brain MRI, or sooner as needed.  All questions were answered. The patient knows to call the clinic with any problems, questions or concerns. No barriers to learning were detected.  The total time spent in the encounter was 40 minutes and more than 50% was on counseling and review of test results   Henreitta Leber, MD Medical Director of Neuro-Oncology [facility] 08/12/23 3:11 PM

## 2023-09-04 ENCOUNTER — Telehealth: Payer: Self-pay | Admitting: *Deleted

## 2023-09-04 NOTE — Telephone Encounter (Signed)
Patient's daughter Yvonne [name] called. Patient's MRI is scheduled on 2/5. Her appt with Dr. Barbaraann Cao is on 2/10. Yvonne [name] asked if the appt on 2/10 could be moved to the 2/6 or 2/7?  Message routed to MD and support staff

## 2023-09-09 DIAGNOSIS — E1140 Type 2 diabetes mellitus with diabetic neuropathy, unspecified: Secondary | ICD-10-CM | POA: Diagnosis not present

## 2023-09-18 ENCOUNTER — Other Ambulatory Visit: Payer: Self-pay

## 2023-09-18 ENCOUNTER — Ambulatory Visit (HOSPITAL_COMMUNITY)
Admission: RE | Admit: 2023-09-18 | Discharge: 2023-09-18 | Disposition: A | Discharge status: Home / Self Care | Payer: Medicare PPO | Source: Ambulatory Visit | Attending: Internal Medicine | Admitting: Internal Medicine

## 2023-09-18 DIAGNOSIS — G9389 Other specified disorders of brain: Secondary | ICD-10-CM | POA: Diagnosis not present

## 2023-09-18 DIAGNOSIS — R220 Localized swelling, mass and lump, head: Secondary | ICD-10-CM | POA: Diagnosis not present

## 2023-09-18 DIAGNOSIS — C719 Malignant neoplasm of brain, unspecified: Secondary | ICD-10-CM

## 2023-09-18 DIAGNOSIS — J323 Chronic sphenoidal sinusitis: Secondary | ICD-10-CM | POA: Diagnosis not present

## 2023-09-18 MED ORDER — GADOBUTROL 1 MMOL/ML IV SOLN
7.0000 mL | Freq: Once | INTRAVENOUS | Status: AC | PRN
  Administered 2023-09-18: 7 mL via INTRAVENOUS

## 2023-09-19 ENCOUNTER — Inpatient Hospital Stay: Payer: Medicare PPO | Attending: Internal Medicine

## 2023-09-19 ENCOUNTER — Inpatient Hospital Stay: Payer: Medicare PPO | Admitting: Internal Medicine

## 2023-09-19 ENCOUNTER — Telehealth: Payer: Self-pay | Admitting: Internal Medicine

## 2023-09-19 VITALS — BP 138/56 | HR 66 | Temp 97.5°F | Resp 17 | Wt 146.8 lb

## 2023-09-19 DIAGNOSIS — Z79890 Hormone replacement therapy (postmenopausal): Secondary | ICD-10-CM | POA: Insufficient documentation

## 2023-09-19 DIAGNOSIS — G4730 Sleep apnea, unspecified: Secondary | ICD-10-CM | POA: Diagnosis not present

## 2023-09-19 DIAGNOSIS — Z8041 Family history of malignant neoplasm of ovary: Secondary | ICD-10-CM | POA: Diagnosis not present

## 2023-09-19 DIAGNOSIS — Z923 Personal history of irradiation: Secondary | ICD-10-CM | POA: Diagnosis not present

## 2023-09-19 DIAGNOSIS — Z8052 Family history of malignant neoplasm of bladder: Secondary | ICD-10-CM | POA: Diagnosis not present

## 2023-09-19 DIAGNOSIS — E785 Hyperlipidemia, unspecified: Secondary | ICD-10-CM | POA: Insufficient documentation

## 2023-09-19 DIAGNOSIS — I10 Essential (primary) hypertension: Secondary | ICD-10-CM | POA: Insufficient documentation

## 2023-09-19 DIAGNOSIS — Z800 Family history of malignant neoplasm of digestive organs: Secondary | ICD-10-CM | POA: Insufficient documentation

## 2023-09-19 DIAGNOSIS — K449 Diaphragmatic hernia without obstruction or gangrene: Secondary | ICD-10-CM | POA: Insufficient documentation

## 2023-09-19 DIAGNOSIS — E1140 Type 2 diabetes mellitus with diabetic neuropathy, unspecified: Secondary | ICD-10-CM | POA: Insufficient documentation

## 2023-09-19 DIAGNOSIS — Z7984 Long term (current) use of oral hypoglycemic drugs: Secondary | ICD-10-CM | POA: Diagnosis not present

## 2023-09-19 DIAGNOSIS — Z79899 Other long term (current) drug therapy: Secondary | ICD-10-CM | POA: Diagnosis not present

## 2023-09-19 DIAGNOSIS — C719 Malignant neoplasm of brain, unspecified: Secondary | ICD-10-CM

## 2023-09-19 DIAGNOSIS — Z9221 Personal history of antineoplastic chemotherapy: Secondary | ICD-10-CM | POA: Insufficient documentation

## 2023-09-19 DIAGNOSIS — E039 Hypothyroidism, unspecified: Secondary | ICD-10-CM | POA: Diagnosis not present

## 2023-09-19 DIAGNOSIS — C712 Malignant neoplasm of temporal lobe: Secondary | ICD-10-CM

## 2023-09-19 DIAGNOSIS — Z87891 Personal history of nicotine dependence: Secondary | ICD-10-CM | POA: Insufficient documentation

## 2023-09-19 DIAGNOSIS — I341 Nonrheumatic mitral (valve) prolapse: Secondary | ICD-10-CM | POA: Diagnosis not present

## 2023-09-19 DIAGNOSIS — Z801 Family history of malignant neoplasm of trachea, bronchus and lung: Secondary | ICD-10-CM | POA: Diagnosis not present

## 2023-09-19 DIAGNOSIS — Z8042 Family history of malignant neoplasm of prostate: Secondary | ICD-10-CM | POA: Diagnosis not present

## 2023-09-19 DIAGNOSIS — Z803 Family history of malignant neoplasm of breast: Secondary | ICD-10-CM | POA: Insufficient documentation

## 2023-09-19 LAB — CBC WITH DIFFERENTIAL (CANCER CENTER ONLY)
Abs Immature Granulocytes: 0.01 10*3/uL (ref 0.00–0.07)
Basophils Absolute: 0 10*3/uL (ref 0.0–0.1)
Basophils Relative: 1 %
Eosinophils Absolute: 0.2 10*3/uL (ref 0.0–0.5)
Eosinophils Relative: 4 %
HCT: 42.1 % (ref 36.0–46.0)
Hemoglobin: 14 g/dL (ref 12.0–15.0)
Immature Granulocytes: 0 %
Lymphocytes Relative: 16 %
Lymphs Abs: 0.9 10*3/uL (ref 0.7–4.0)
MCH: 27.3 pg (ref 26.0–34.0)
MCHC: 33.3 g/dL (ref 30.0–36.0)
MCV: 82.1 fL (ref 80.0–100.0)
Monocytes Absolute: 0.6 10*3/uL (ref 0.1–1.0)
Monocytes Relative: 11 %
Neutro Abs: 3.6 10*3/uL (ref 1.7–7.7)
Neutrophils Relative %: 68 %
Platelet Count: 165 10*3/uL (ref 150–400)
RBC: 5.13 MIL/uL — ABNORMAL HIGH (ref 3.87–5.11)
RDW: 14.2 % (ref 11.5–15.5)
WBC Count: 5.2 10*3/uL (ref 4.0–10.5)
nRBC: 0 % (ref 0.0–0.2)

## 2023-09-19 LAB — CMP (CANCER CENTER ONLY)
ALT: 12 U/L (ref 0–44)
AST: 11 U/L — ABNORMAL LOW (ref 15–41)
Albumin: 4.2 g/dL (ref 3.5–5.0)
Alkaline Phosphatase: 54 U/L (ref 38–126)
Anion gap: 5 (ref 5–15)
BUN: 19 mg/dL (ref 8–23)
CO2: 31 mmol/L (ref 22–32)
Calcium: 9.4 mg/dL (ref 8.9–10.3)
Chloride: 103 mmol/L (ref 98–111)
Creatinine: 0.73 mg/dL (ref 0.44–1.00)
GFR, Estimated: >60 mL/min (ref >60)
Glucose, Bld: 188 mg/dL — ABNORMAL HIGH (ref 70–99)
Potassium: 4 mmol/L (ref 3.5–5.1)
Sodium: 139 mmol/L (ref 135–145)
Total Bilirubin: 0.5 mg/dL (ref 0.0–1.2)
Total Protein: 6.8 g/dL (ref 6.5–8.1)

## 2023-09-19 NOTE — Progress Notes (Signed)
 [facility] [address]  [place], [place] [address] [phone]   Interval Evaluation  Date of Service: 09/19/23 Patient Name: Yvonne [name] Patient MRN: [id number] Patient DOB: 03/30/1946 Provider: Arthea MARLA Manns, MD  Identifying Statement:  Yvonne [name] is a 78 y.o. female with left temporal glioblastoma    Oncologic History: Oncology History  Glioblastoma, IDH-wildtype (HCC)  04/18/2022 Surgery   Left temporal craniotomy, resection with Dr. Saturnino at [facility].  Path is glioblastoma, IDHwt   05/16/2022 -  Chemotherapy   [name] : BRAIN GLIOBLASTOMA Radiation Therapy With Concurrent Temozolomide  75 mg/m2 Daily Followed By Sequential Maintenance Temozolomide  x 6-12 cycles       Biomarkers:  MGMT Unknown.  IDH 1/2 Wild type.  EGFR Unknown  TERT Unknown   Interval History: Yvonne [name] presents today for follow up after recent MRI brain.  She feels well overall, language issues have not progressed.  Steroid course, now completed, did interfere with her sleep.  Issues with short term memory, irritable behavior are stable.  No further seizures, no headaches.    H+P (05/03/22) Patient presented to medical attention in August 2023 with episode of sudden onset speech impairment.  Initially treated as stroke with TPA, CNS imaging later demonstrated enhancing mass in the left temporal lobe, c/w primary brain tumor.  She underwent resection at [facility] with Dr. Saturnino after second opinion there.  Following surgery she experienced some speech impairment; this has improved modestly, but she still experiences difficulty with finding correct words.  Also struggling with short term memory.  She is currently living with her daughter given cognitive limitations, need for some supervision.  No further seizures with the Keppra  500mg  twice per day.  Currently on decadron  2mg  daily per discharge taper from [facility].     Medications: Current Outpatient Medications on File Prior to Visit  Medication Sig Dispense Refill   acetaminophen  (TYLENOL ) 500 MG tablet Take 1,000 mg by mouth every 8 (eight) hours as needed for moderate pain.     Cholecalciferol  (VITAMIN D ) 50 MCG (2000 UT) tablet Take 2,000 Units by mouth daily.      fluticasone  (FLONASE ) 50 MCG/ACT nasal spray Place 2 sprays into both nostrils at bedtime.     furosemide  (LASIX ) 20 MG tablet Take 10 mg by mouth as needed for edema.     gabapentin  (NEURONTIN ) 300 MG capsule Take 300 mg by mouth at bedtime.      JARDIANCE  10 MG TABS tablet 1 tablet Orally Once a day     levothyroxine  (SYNTHROID ) 88 MCG tablet Take 88 mcg by mouth daily before breakfast.     metFORMIN (GLUCOPHAGE) 500 MG tablet Take 500 mg by mouth 2 (two) times daily.     metoprolol  succinate (TOPROL -XL) 100 MG 24 hr tablet Take 100 mg by mouth daily.     metroNIDAZOLE  (METROCREAM ) 0.75 % cream Apply 1 application  topically daily as needed.     ondansetron  (ZOFRAN ) 8 MG tablet Take 1 tablet by mouth every 8 hours as needed for nausea or vomiting. May take 30 - 60 minutes prior to Temodar  administration if nausea/vomiting occurs. 30 tablet 1   prochlorperazine  (COMPAZINE ) 10 MG tablet Take 1 tablet (10 mg total) by mouth every 6 (six) hours as needed for nausea or vomiting. 30 tablet 0   rosuvastatin  (CRESTOR ) 5 MG tablet Take 5 mg by mouth daily.     Current Facility-Administered Medications on File Prior  to Visit  Medication Dose Route Frequency Provider Last Rate Last Admin   influenza vaccine adjuvanted (FLUAD) injection 0.5 mL  0.5 mL Intramuscular Once Saud Bail K, MD        Allergies:  Allergies  Allergen Reactions   Tobramycin Hives   Sulfa Antibiotics     Stomach upset   Erythromycin Nausea Only   Past Medical History:  Past Medical History:  Diagnosis Date   Anxiety    Chest pressure    Degenerative arthritis    Deviated septum    Diabetes mellitus without  complication (HCC)    diet controlled   Diabetic neuropathy (HCC) 12/03/2017   DJD (degenerative joint disease) of knee    Family history of bladder cancer    Family history of brain cancer    Family history of breast cancer    Family history of ovarian cancer    Family history of prostate cancer    Family history of stomach cancer    Family history of throat cancer    Family history of uterine cancer    Graves disease    RADIOACTIVE IODINE 1983   H/O hiatal hernia    Heart palpitations    History of diastolic dysfunction    per echo in 2011   Hyperlipidemia    Hypertension    Hypothyroidism    LVH (left ventricular hypertrophy)    per echo in 2011   Mitral valve prolapse    Neuropathy, peripheral    DR. JENEL 12/15   Normal nuclear stress test 2011   OSA (obstructive sleep apnea)    Polyneuropathy in other diseases classified elsewhere (HCC) 05/21/2013   Rosacea 2019   MELANOMA CHEST REMOVED IN SITU   Sleep difficulties    Tinnitus    Past Surgical History:  Past Surgical History:  Procedure Laterality Date   ABDOMINAL HYSTERECTOMY  2000   TOTAL   APPENDECTOMY  1989   CARDIOVASCULAR STRESS TEST  11/09/2009   EF 75%   CARPAL TUNNEL RELEASE  2002   bilateral   COLONOSCOPY WITH PROPOFOL  N/A 10/23/2018   Procedure: COLONOSCOPY WITH PROPOFOL ;  Surgeon: Teressa Toribio SQUIBB, MD;  Location: [facility];  Service: Endoscopy;  Laterality: N/A;   ESOPHAGOGASTRODUODENOSCOPY N/A 11/16/2021   Procedure: ESOPHAGOGASTRODUODENOSCOPY (EGD);  Surgeon: Teressa Toribio SQUIBB, MD;  Location: [facility];  Service: Endoscopy;  Laterality: N/A;   ESOPHAGOGASTRODUODENOSCOPY (EGD) WITH PROPOFOL  N/A 10/23/2018   Procedure: ESOPHAGOGASTRODUODENOSCOPY (EGD) WITH PROPOFOL ;  Surgeon: Teressa Toribio SQUIBB, MD;  Location: [facility];  Service: Endoscopy;  Laterality: N/A;   EUS N/A 10/23/2018   Procedure: UPPER ENDOSCOPIC ULTRASOUND (EUS) RADIAL;  Surgeon: Teressa Toribio SQUIBB, MD;  Location: [facility];  Service:  Endoscopy;  Laterality: N/A;   EUS N/A 11/16/2021   Procedure: UPPER ENDOSCOPIC ULTRASOUND (EUS) RADIAL;  Surgeon: Teressa Toribio SQUIBB, MD;  Location: [facility];  Service: Endoscopy;  Laterality: N/A;   MASS EXCISION Left 06/02/2019   Procedure: EXCISION OF SUBCUTANEOUS ABDOMINAL WALL MASSES LEFT LOWER QUADRANT;  Surgeon: Belinda Cough, MD;  Location: [facility];  Service: General;  Laterality: Left;   TONSILLECTOMY  1958   TRANSTHORACIC ECHOCARDIOGRAM  11/01/2009   NORMAL LV FILLING AND DIASTOLIC DYSFUNCTION AND MILD AORTIC SCLEROSIS AND TRACE MITRAL REGURGITATION   Social History:  Social History   Socioeconomic History   Marital status: Married    Spouse name: Yvonne [name]   Number of children: 1   Years of education: COLLEGE   Highest education level: Not on file  Occupational History   Occupation: Retired  Tobacco Use   Smoking status: Former    Current packs/day: 0.00    Average packs/day: 0.5 packs/day for 10.0 years (5.0 ttl pk-yrs)    Types: Cigarettes    Start date: 08/13/1965    Quit date: 08/14/1975    Years since quitting: 48.1   Smokeless tobacco: Never  Vaping Use   Vaping status: Never Used  Substance and Sexual Activity   Alcohol use: Yes    Comment: one glass of wine a week   Drug use: No   Sexual activity: Not on file  Other Topics Concern   Not on file  Social History Narrative   Lives at home w/ her husband, Yvonne [name].   Right-handed.   Drinks no caffeine but does eat chocolate occasionally.   epworth sleepiness scale = 3 (01/10/16)   Social Drivers of Health   Financial Resource Strain: Low Risk  (07/05/2023)   Received from [facility]   Overall Financial Resource Strain (CARDIA)    Difficulty of Paying Living Expenses: Not hard at all  Food Insecurity: No Food Insecurity (07/05/2023)   Received from [facility]   Hunger Vital Sign    Worried About Running Out of Food in the Last Year: Never true    Ran Out of Food in the Last  Year: Never true  Transportation Needs: No Transportation Needs (07/05/2023)   [facility] - Transportation    In the past 12 months, has lack of transportation kept you from medical appointments or from getting medications?: No    Lack of Transportation (Non-Medical): No  Physical Activity: Not on file  Stress: Not on file  Social Connections: Not on file  Intimate Partner Violence: Not At Risk (06/15/2022)   Humiliation, Afraid, Rape, and Kick questionnaire    Fear of Current or Ex-Partner: No    Emotionally Abused: No    Physically Abused: No    Sexually Abused: No   Family History:  Family History  Problem Relation Age of Onset   Hypertension Mother    Heart disease Mother    Dementia Mother    Heart disease Father    Arthritis Father    Diabetes Father    Hypertension Father    Pancreatic cancer Brother 68       pancreatic   Other Brother        automobile accident   Throat cancer Maternal Uncle    Brain cancer Paternal Aunt 70       malignant brain cancer   Stroke Maternal Grandmother    Heart disease Maternal Grandfather    Pancreatic cancer Cousin 87       pancreatic (maternal first cousin)   Pancreatic cancer Cousin 72       pancreatic, smoker (maternal first cousin)   Breast cancer Cousin 98       breast (maternal first cousin)   Ovarian cancer Cousin    Uterine cancer Cousin 23       uterine (paternal first cousin)   Cancer Cousin 70       unknown primary (paternal first cousin)   Bladder Cancer Cousin 64       (paternal first cousin)   Lung cancer Cousin    Throat cancer Cousin    Prostate cancer Cousin        (maternal first cousin)   Stomach cancer Cousin 36       (  maternal first cousin)   Cancer Other 49       pancreatic neuroendocrine carcinoma   Colon cancer Neg Hx    Neuropathy Neg Hx     Review of Systems: Constitutional: Doesn't report fevers, chills or abnormal weight loss Eyes: Doesn't report  blurriness of vision Ears, nose, mouth, throat, and face: Doesn't report sore throat Respiratory: Doesn't report cough, dyspnea or wheezes Cardiovascular: Doesn't report palpitation, chest discomfort  Gastrointestinal:  Doesn't report nausea, constipation, diarrhea GU: Doesn't report incontinence Skin: Doesn't report skin rashes Neurological: Per HPI Musculoskeletal: Doesn't report joint pain Behavioral/Psych: Doesn't report anxiety  Physical Exam: Vitals:   09/19/23 0858  BP: (!) 138/56  Pulse: 66  Resp: 17  Temp: (!) 97.5 F (36.4 C)  SpO2: 99%   KPS: 70. General: Alert, cooperative, pleasant, in no acute distress Head: Normal EENT: No conjunctival injection or scleral icterus.  Lungs: Resp effort normal Cardiac: Regular rate Abdomen: Non-distended abdomen Skin: No rashes cyanosis or petechiae. Extremities: No clubbing or edema  Neurologic Exam: Mental Status: Awake, alert, attentive to examiner. Oriented to self and environment. Language is fluent but comprehension is markedly impaired.  Anterograde amnesia is noted, mild neglect, elements of agnosia. Cranial Nerves: Visual acuity is grossly normal. Visual fields are full. Extra-ocular movements intact. No ptosis. Face is symmetric Motor: Tone and bulk are normal. Power is full in both arms and legs. Reflexes are symmetric, no pathologic reflexes present.  Sensory: Intact to light touch Gait: Normal.   Labs: I have reviewed the data as listed    Component Value Date/Time   NA 139 09/19/2023 0834   K 4.0 09/19/2023 0834   CL 103 09/19/2023 0834   CO2 31 09/19/2023 0834   GLUCOSE 188 (H) 09/19/2023 0834   BUN 19 09/19/2023 0834   CREATININE 0.73 09/19/2023 0834   CREATININE 0.71 01/04/2016 0739   CALCIUM  9.4 09/19/2023 0834   PROT 6.8 09/19/2023 0834   PROT 6.7 10/03/2021 0918   ALBUMIN 4.2 09/19/2023 0834   AST 11 (L) 09/19/2023 0834   ALT 12 09/19/2023 0834   ALKPHOS 54 09/19/2023 0834   BILITOT 0.5  09/19/2023 0834   GFRNONAA >60 09/19/2023 0834   GFRAA >60 10/23/2019 1004   Lab Results  Component Value Date   WBC 5.2 09/19/2023   NEUTROABS 3.6 09/19/2023   HGB 14.0 09/19/2023   HCT 42.1 09/19/2023   MCV 82.1 09/19/2023   PLT 165 09/19/2023    Imaging:  [facility] Clinician Interpretation: I have personally reviewed the CNS images as listed.  My interpretation, in the context of the patient's clinical presentation, is treatment effect vs true progression  MR OUTSIDE FILMS HEAD/FACE Result Date: 09/19/2023 This examination belongs to an outside facility and is stored here for comparison purposes only.  Contact the originating outside institution for any associated report or interpretation.  MR BRAIN W WO CONTRAST Result Date: 09/18/2023 CLINICAL DATA:  Provided history: Glioblastoma, IDH wild type. Brain/CNS neoplasm, assess treatment response. EXAM: MRI HEAD WITHOUT AND WITH CONTRAST TECHNIQUE: Multiplanar, multiecho pulse sequences of the brain and surrounding structures were obtained without and with intravenous contrast. CONTRAST:  7mL GADAVIST  GADOBUTROL  1 MMOL/ML IV SOLN COMPARISON:  Prior brain MRI examinations 08/09/2023 and earlier. FINDINGS: Brain: Again demonstrated are post-operative changes from prior left temporal lobe mass resection. Chronic blood products again noted within/about the resection cavity. Since the prior MRI of 08/09/2023, a focus of irregular enhancement at the posterior margin of the resection cavity (and extending  to the ependymal margin of the left lateral ventricle temporal horn) has slightly increased in size and conspicuity (for instance compare image 17 of series 15 on the current exam to series 17, image 13 of the prior exam). This focus of enhancement spans 20 mm in greatest dimension (series 16, image 64) (previously 18 mm when remeasured on prior). T2 FLAIR hyperintense parenchymal signal abnormality surrounding the resection cavity has remained stable.  Small focus of chronic encephalomalacia/gliosis within the anterolateral left frontal lobe, unchanged. There is no acute infarct. No extra-axial fluid collection. No midline shift. Vascular: Maintained flow voids within the proximal large arterial vessels. Skull and upper cervical spine: No focal worrisome marrow lesion. Left-sided cranioplasty. Sinuses/Orbits: No mass or acute finding within the imaged orbits. Moderate mucosal thickening within the right maxillary sinus. Minimal mucosal thickening within the left maxillary sinus. Severe chronic left sphenoid sinus disease. Other: Trace fluid within left mastoid air cells. Impression #1 will be called to the ordering clinician or representative by the Radiologist Assistant, and communication documented in the PACS or [employer]. IMPRESSION: 1. Since the prior brain MRI of 08/09/2023, continued slight increase in size and conspicuity of and irregular focus of enhancement along at the posterior aspect of the left temporal lobe resection cavity (extending to the ependymal margin of the left lateral ventricle temporal horn). This is indeterminate in etiology and may reflect effect of prior radiation therapy or progressive tumor. T2 FLAIR hyperintense parenchymal signal abnormality surrounding the resection cavity has remained stable. 2. Paranasal sinus disease as described. Electronically Signed   By: Rockey Childs D.O.   On: 09/18/2023 11:43    Pathology: A. Brain Tissue, Biopsy: Mildly cellular brain tissue with hyalinized vessels, necrosis and scattered atypical glia cells, consistent with residual/recurrent glioma with treatment effect. (See comment).   Comment: The patient's prior specimen of left temporal GBM is noted ([id number]). In context, the findings are consistent with a residual/recurrent high-grade glioma with extensive treatment effect and limited viable tumor cells. Clinical and radiologic correlation is suggested. TERT promoter mutation  test is being performed to substantiate the diagnosis. The result will be reported separately. Dr. Arlean Serve was consulted.  Electronically signed by Arminda Cassis, MD on 07/17/2023 at 11:28 AM    Assessment/Plan Glioblastoma, IDH-wildtype [facility]) - Plan: MR BRAIN W WO CONTRAST  Glioblastoma of temporal lobe (HCC)  CARAN STORCK is clinically stable today.  MRI brain demonstrates subtle increase in enhancement within recently biopsied left temporal nodular enhancing focus.  Pathology demonstrated radiation necrosis; growth can be seen over several months with RN, although tumor remains a possibility.  Overall growth rate on present scan has decreased compared to prior interval.  Recommended continued close surveillance, with repeat MRI brain in ~6 weeks.  Will defer resumption of Temodar .  She is agreeable with this plan.  We ask that Yvonne [name] return to clinic in 6 weeks following next brain MRI, or sooner as needed.  All questions were answered. The patient knows to call the clinic with any problems, questions or concerns. No barriers to learning were detected.  The total time spent in the encounter was 40 minutes and more than 50% was on counseling and review of test results   Arthea MARLA Manns, MD Medical Director of Neuro-Oncology [facility] at [place] [facility] 09/19/23 9:35 AM

## 2023-09-19 NOTE — Telephone Encounter (Signed)
 Yvonne [name]

## 2023-09-23 ENCOUNTER — Inpatient Hospital Stay: Payer: Medicare PPO

## 2023-09-23 ENCOUNTER — Inpatient Hospital Stay: Payer: Medicare PPO | Admitting: Internal Medicine

## 2023-09-24 DIAGNOSIS — L111 Transient acantholytic dermatosis [Grover]: Secondary | ICD-10-CM | POA: Diagnosis not present

## 2023-09-24 DIAGNOSIS — D225 Melanocytic nevi of trunk: Secondary | ICD-10-CM | POA: Diagnosis not present

## 2023-09-24 DIAGNOSIS — L821 Other seborrheic keratosis: Secondary | ICD-10-CM | POA: Diagnosis not present

## 2023-09-24 DIAGNOSIS — L603 Nail dystrophy: Secondary | ICD-10-CM | POA: Diagnosis not present

## 2023-10-02 ENCOUNTER — Encounter: Payer: Self-pay | Admitting: Internal Medicine

## 2023-10-16 DIAGNOSIS — E119 Type 2 diabetes mellitus without complications: Secondary | ICD-10-CM | POA: Diagnosis not present

## 2023-10-16 DIAGNOSIS — H52203 Unspecified astigmatism, bilateral: Secondary | ICD-10-CM | POA: Diagnosis not present

## 2023-10-16 DIAGNOSIS — H2513 Age-related nuclear cataract, bilateral: Secondary | ICD-10-CM | POA: Diagnosis not present

## 2023-10-30 ENCOUNTER — Telehealth: Payer: Self-pay | Admitting: *Deleted

## 2023-10-30 ENCOUNTER — Ambulatory Visit (HOSPITAL_COMMUNITY)
Admission: RE | Admit: 2023-10-30 | Discharge: 2023-10-30 | Disposition: A | Discharge status: Home / Self Care | Payer: Medicare PPO | Source: Ambulatory Visit | Attending: Internal Medicine | Admitting: Internal Medicine

## 2023-10-30 DIAGNOSIS — R930 Abnormal findings on diagnostic imaging of skull and head, not elsewhere classified: Secondary | ICD-10-CM | POA: Diagnosis not present

## 2023-10-30 DIAGNOSIS — C719 Malignant neoplasm of brain, unspecified: Secondary | ICD-10-CM | POA: Insufficient documentation

## 2023-10-30 MED ORDER — GADOBUTROL 1 MMOL/ML IV SOLN
6.5000 mL | Freq: Once | INTRAVENOUS | Status: AC | PRN
  Administered 2023-10-30: 6.5 mL via INTRAVENOUS

## 2023-10-30 NOTE — Telephone Encounter (Signed)
 Requested that [facility] push images from today's MRI brain to [facility].

## 2023-10-31 ENCOUNTER — Telehealth: Payer: Self-pay | Admitting: Pharmacist

## 2023-10-31 ENCOUNTER — Other Ambulatory Visit (HOSPITAL_COMMUNITY): Payer: Self-pay

## 2023-10-31 ENCOUNTER — Telehealth: Payer: Self-pay | Admitting: Pharmacy Technician

## 2023-10-31 ENCOUNTER — Other Ambulatory Visit: Payer: Self-pay | Admitting: Pharmacy Technician

## 2023-10-31 ENCOUNTER — Telehealth: Payer: Self-pay | Admitting: Internal Medicine

## 2023-10-31 ENCOUNTER — Inpatient Hospital Stay: Payer: Medicare PPO | Attending: Internal Medicine | Admitting: Internal Medicine

## 2023-10-31 VITALS — BP 139/60 | HR 67 | Temp 97.8°F | Resp 17 | Wt 147.8 lb

## 2023-10-31 DIAGNOSIS — G4730 Sleep apnea, unspecified: Secondary | ICD-10-CM | POA: Diagnosis not present

## 2023-10-31 DIAGNOSIS — Z803 Family history of malignant neoplasm of breast: Secondary | ICD-10-CM | POA: Diagnosis not present

## 2023-10-31 DIAGNOSIS — Z808 Family history of malignant neoplasm of other organs or systems: Secondary | ICD-10-CM | POA: Diagnosis not present

## 2023-10-31 DIAGNOSIS — Z8052 Family history of malignant neoplasm of bladder: Secondary | ICD-10-CM | POA: Diagnosis not present

## 2023-10-31 DIAGNOSIS — I341 Nonrheumatic mitral (valve) prolapse: Secondary | ICD-10-CM | POA: Diagnosis not present

## 2023-10-31 DIAGNOSIS — Z8042 Family history of malignant neoplasm of prostate: Secondary | ICD-10-CM | POA: Diagnosis not present

## 2023-10-31 DIAGNOSIS — Z79890 Hormone replacement therapy (postmenopausal): Secondary | ICD-10-CM | POA: Insufficient documentation

## 2023-10-31 DIAGNOSIS — Z7984 Long term (current) use of oral hypoglycemic drugs: Secondary | ICD-10-CM | POA: Insufficient documentation

## 2023-10-31 DIAGNOSIS — E039 Hypothyroidism, unspecified: Secondary | ICD-10-CM | POA: Diagnosis not present

## 2023-10-31 DIAGNOSIS — E119 Type 2 diabetes mellitus without complications: Secondary | ICD-10-CM | POA: Insufficient documentation

## 2023-10-31 DIAGNOSIS — I10 Essential (primary) hypertension: Secondary | ICD-10-CM | POA: Diagnosis not present

## 2023-10-31 DIAGNOSIS — Z85820 Personal history of malignant melanoma of skin: Secondary | ICD-10-CM | POA: Insufficient documentation

## 2023-10-31 DIAGNOSIS — C719 Malignant neoplasm of brain, unspecified: Secondary | ICD-10-CM

## 2023-10-31 DIAGNOSIS — E1140 Type 2 diabetes mellitus with diabetic neuropathy, unspecified: Secondary | ICD-10-CM | POA: Insufficient documentation

## 2023-10-31 DIAGNOSIS — Z801 Family history of malignant neoplasm of trachea, bronchus and lung: Secondary | ICD-10-CM | POA: Diagnosis not present

## 2023-10-31 DIAGNOSIS — E785 Hyperlipidemia, unspecified: Secondary | ICD-10-CM | POA: Insufficient documentation

## 2023-10-31 DIAGNOSIS — C712 Malignant neoplasm of temporal lobe: Secondary | ICD-10-CM | POA: Diagnosis not present

## 2023-10-31 DIAGNOSIS — Z79899 Other long term (current) drug therapy: Secondary | ICD-10-CM | POA: Diagnosis not present

## 2023-10-31 DIAGNOSIS — K449 Diaphragmatic hernia without obstruction or gangrene: Secondary | ICD-10-CM | POA: Insufficient documentation

## 2023-10-31 DIAGNOSIS — Z8041 Family history of malignant neoplasm of ovary: Secondary | ICD-10-CM | POA: Diagnosis not present

## 2023-10-31 MED ORDER — ONDANSETRON HCL 8 MG PO TABS
8.0000 mg | ORAL_TABLET | Freq: Three times a day (TID) | ORAL | 1 refills | Status: DC | PRN
  Filled 2023-10-31: qty 30, 10d supply, fill #0

## 2023-10-31 MED ORDER — TEMOZOLOMIDE 100 MG PO CAPS
300.0000 mg | ORAL_CAPSULE | Freq: Every day | ORAL | 0 refills | Status: DC
Start: 2023-10-31 — End: 2023-12-02
  Filled 2023-10-31: qty 15, 28d supply, fill #0

## 2023-10-31 NOTE — Telephone Encounter (Signed)
 Patient scheduled appointments. Patient is aware of all appointment details.

## 2023-10-31 NOTE — Telephone Encounter (Signed)
 Oral Oncology Pharmacist Encounter  Received new prescription for Temodar (temozolomide) for the re-initiation of treatment of glioblastoma, planned duration 6-12 cycles.  CBC w/ Diff and CMP from 09/19/23 assessed, no relevant lab abnormalities requiring baseline dose adjustment required at this time. Patient starting back on previous TMZ maintenance dosing that was tolerated in the past per MD. Prescription dose and frequency assessed for appropriateness.  Current medication list in Epic reviewed, no relevant/significant DDIs with Temodar identified.  Evaluated chart and no patient barriers to medication adherence noted.   Patient agreement for treatment documented in MD note on 10/31/23.  Prescription has been e-scribed to the [facility] for benefits analysis and approval.  Oral Oncology Clinic will continue to follow for insurance authorization, copayment issues, initial counseling and start date.  Sherry Ruffing, PharmD, BCPS, BCOP Hematology/Oncology Clinical Pharmacist [facility] Oral Chemotherapy Navigation Clinics [phone] 10/31/2023 10:33 AM

## 2023-10-31 NOTE — Telephone Encounter (Signed)
 Oral Chemotherapy Pharmacist Encounter  I spoke with patient's daughter, Myrtie Soman, for review of: Temodar (temozolomide) forfor the re-initiation of treatment of glioblastoma, planned duration 6-12 cycles.  Counseled on administration, dosing, side effects, monitoring, drug-food interactions, safe handling, storage, and disposal.  Patient will take Temodar 100mg  capsules, 3 capsules, 300mg  total daily dose, by mouth once daily, may take at bedtime and on an empty stomach to decrease nausea and vomiting.  Patient will take Temodar daily for 5 days on, 23 days off, and repeated.  Temodar start date: 11/04/23 PM   Adverse effects include but are not limited to: nausea, vomiting, anorexia, GI upset, rash, and fatigue. N/V PPX: Patient will take Zofran 8mg  tablet, 1 tablet by mouth 30-60 min prior to Temodar dose to help decrease N/V.  Reviewed importance of keeping a medication schedule and plan for any missed doses. No barriers to medication adherence identified.  Medication reconciliation performed and medication/allergy list updated.  All questions answered.  Patient's daughter voiced understanding and appreciation.   Medication education handout placed in mail for patient and patient's family. Patient's family knows to call the office with questions or concerns. Oral Chemotherapy Clinic phone number provided.   Sherry Ruffing, PharmD, BCPS, BCOP Hematology/Oncology Clinical Pharmacist [facility] Oral Chemotherapy Navigation Clinics [phone] 10/31/2023 10:49 AM

## 2023-10-31 NOTE — Telephone Encounter (Signed)
 Oral Oncology Patient Advocate Encounter  After completing a benefits investigation, prior authorization for temozolomide is not required at this time through [facility].  Patient's copay is $50.     Omer Jack, CPhT-Adv Oncology Pharmacy Patient Advocate [facility]  Direct Number: [phone]  Fax: [phone]

## 2023-10-31 NOTE — Progress Notes (Signed)
 [facility] at [facility] [address]  [place], [place] [address] [phone]   Interval Evaluation  Date of Service: 10/31/23 Patient Name: Yvonne [name] Patient MRN: [id number] Patient DOB: August 23, 1945 Provider: Henreitta Leber, MD  Identifying Statement:  Yvonne [name] is a 78 y.o. female with left temporal glioblastoma    Oncologic History: Oncology History  Glioblastoma, IDH-wildtype (HCC)  04/18/2022 Surgery   Left temporal craniotomy, resection with Dr. Zachery Conch at [facility].  Path is glioblastoma, IDHwt   05/16/2022 -  Chemotherapy   [name] : BRAIN GLIOBLASTOMA Radiation Therapy With Concurrent Temozolomide 75 mg/m2 Daily Followed By Sequential Maintenance Temozolomide x 6-12 cycles       Biomarkers:  MGMT Unknown.  IDH 1/2 Wild type.  EGFR Unknown  TERT Unknown   Interval History: [name] presents today for follow up after recent MRI brain.  No new or progressive symptoms, language issues have not progressed.  Issues with short term memory, irritable behavior are stable.  No further seizures, no headaches.    H+P (05/03/22) Patient presented to medical attention in August 2023 with episode of sudden onset speech impairment.  Initially treated as stroke with TPA, CNS imaging later demonstrated enhancing mass in the left temporal lobe, c/w primary brain tumor.  She underwent resection at [facility] with Dr. Zachery Conch after second opinion there.  Following surgery she experienced some speech impairment; this has improved modestly, but she still experiences difficulty with finding correct words.  Also struggling with short term memory.  She is currently living with her daughter given cognitive limitations, need for some supervision.  No further seizures with the Keppra 500mg  twice per day.  Currently on decadron 2mg  daily per discharge taper from [facility].    Medications: Current Outpatient Medications on File Prior to Visit   Medication Sig Dispense Refill   acetaminophen (TYLENOL) 500 MG tablet Take 1,000 mg by mouth every 8 (eight) hours as needed for moderate pain.     Cholecalciferol (VITAMIN D) 50 MCG (2000 UT) tablet Take 2,000 Units by mouth daily.      fluticasone (FLONASE) 50 MCG/ACT nasal spray Place 2 sprays into both nostrils at bedtime.     furosemide (LASIX) 20 MG tablet Take 10 mg by mouth as needed for edema.     gabapentin (NEURONTIN) 300 MG capsule Take 300 mg by mouth at bedtime.      JARDIANCE 10 MG TABS tablet 1 tablet Orally Once a day     levothyroxine (SYNTHROID) 88 MCG tablet Take 88 mcg by mouth daily before breakfast.     metFORMIN (GLUCOPHAGE) 500 MG tablet Take 500 mg by mouth 2 (two) times daily.     metoprolol succinate (TOPROL-XL) 100 MG 24 hr tablet Take 100 mg by mouth daily.     metroNIDAZOLE (METROCREAM) 0.75 % cream Apply 1 application  topically daily as needed.     ondansetron (ZOFRAN) 8 MG tablet Take 1 tablet by mouth every 8 hours as needed for nausea or vomiting. May take 30 - 60 minutes prior to Temodar administration if nausea/vomiting occurs. 30 tablet 1   prochlorperazine (COMPAZINE) 10 MG tablet Take 1 tablet (10 mg total) by mouth every 6 (six) hours as needed for nausea or vomiting. 30 tablet 0   rosuvastatin (CRESTOR) 5 MG tablet Take 5 mg by mouth daily.     Current Facility-Administered Medications on File Prior to Visit  Medication Dose Route Frequency Provider Last  Rate Last Admin   influenza vaccine adjuvanted (FLUAD) injection 0.5 mL  0.5 mL Intramuscular Once [place] Matthis, Georgeanna Lea, MD        Allergies:  Allergies  Allergen Reactions   Tobramycin Hives   Sulfa Antibiotics     Stomach upset   Erythromycin Nausea Only   Past Medical History:  Past Medical History:  Diagnosis Date   Anxiety    Chest pressure    Degenerative arthritis    Deviated septum    Diabetes mellitus without complication (HCC)    diet controlled   Diabetic neuropathy (HCC)  12/03/2017   DJD (degenerative joint disease) of knee    Family history of bladder cancer    Family history of brain cancer    Family history of breast cancer    Family history of ovarian cancer    Family history of prostate cancer    Family history of stomach cancer    Family history of throat cancer    Family history of uterine cancer    Graves disease    RADIOACTIVE IODINE 1983   H/O hiatal hernia    Heart palpitations    History of diastolic dysfunction    per echo in 2011   Hyperlipidemia    Hypertension    Hypothyroidism    LVH (left ventricular hypertrophy)    per echo in 2011   Mitral valve prolapse    Neuropathy, peripheral    DR. Anne Hahn 12/15   Normal nuclear stress test 2011   OSA (obstructive sleep apnea)    Polyneuropathy in other diseases classified elsewhere (HCC) 05/21/2013   Rosacea 2019   MELANOMA CHEST REMOVED IN SITU   Sleep difficulties    Tinnitus    Past Surgical History:  Past Surgical History:  Procedure Laterality Date   ABDOMINAL HYSTERECTOMY  2000   TOTAL   APPENDECTOMY  1989   CARDIOVASCULAR STRESS TEST  11/09/2009   EF 75%   CARPAL TUNNEL RELEASE  2002   bilateral   COLONOSCOPY WITH PROPOFOL N/A 10/23/2018   Procedure: COLONOSCOPY WITH PROPOFOL;  Surgeon: Rachael Fee, MD;  Location: [facility];  Service: Endoscopy;  Laterality: N/A;   ESOPHAGOGASTRODUODENOSCOPY N/A 11/16/2021   Procedure: ESOPHAGOGASTRODUODENOSCOPY (EGD);  Surgeon: Rachael Fee, MD;  Location: [facility];  Service: Endoscopy;  Laterality: N/A;   ESOPHAGOGASTRODUODENOSCOPY (EGD) WITH PROPOFOL N/A 10/23/2018   Procedure: ESOPHAGOGASTRODUODENOSCOPY (EGD) WITH PROPOFOL;  Surgeon: Rachael Fee, MD;  Location: [facility];  Service: Endoscopy;  Laterality: N/A;   EUS N/A 10/23/2018   Procedure: UPPER ENDOSCOPIC ULTRASOUND (EUS) RADIAL;  Surgeon: Rachael Fee, MD;  Location: [facility];  Service: Endoscopy;  Laterality: N/A;   EUS N/A 11/16/2021   Procedure:  UPPER ENDOSCOPIC ULTRASOUND (EUS) RADIAL;  Surgeon: Rachael Fee, MD;  Location: [facility];  Service: Endoscopy;  Laterality: N/A;   MASS EXCISION Left 06/02/2019   Procedure: EXCISION OF SUBCUTANEOUS ABDOMINAL WALL MASSES LEFT LOWER QUADRANT;  Surgeon: Manus Rudd, MD;  Location: [facility] OR;  Service: General;  Laterality: Left;   TONSILLECTOMY  1958   TRANSTHORACIC ECHOCARDIOGRAM  11/01/2009   NORMAL LV FILLING AND DIASTOLIC DYSFUNCTION AND MILD AORTIC SCLEROSIS AND TRACE MITRAL REGURGITATION   Social History:  Social History   Socioeconomic History   Marital status: Married    Spouse name: Inocencio Homes   Number of children: 1   Years of education: COLLEGE   Highest education level: Not on file  Occupational History   Occupation: Retired  Tobacco Use  Smoking status: Former    Current packs/day: 0.00    Average packs/day: 0.5 packs/day for 10.0 years (5.0 ttl pk-yrs)    Types: Cigarettes    Start date: 08/13/1965    Quit date: 08/14/1975    Years since quitting: 48.2   Smokeless tobacco: Never  Vaping Use   Vaping status: Never Used  Substance and Sexual Activity   Alcohol use: Yes    Comment: one glass of wine a week   Drug use: No   Sexual activity: Not on file  Other Topics Concern   Not on file  Social History Narrative   Lives at home w/ her husband, Inocencio Homes.   Right-handed.   Drinks no caffeine but does eat chocolate occasionally.   epworth sleepiness scale = 3 (01/10/16)   Social Drivers of Health   Financial Resource Strain: Low Risk  (07/05/2023)   Received from [facility]   Overall Financial Resource Strain (CARDIA)    Difficulty of Paying Living Expenses: Not hard at all  Food Insecurity: No Food Insecurity (07/05/2023)   Received from [facility]   Hunger Vital Sign    Worried About Running Out of Food in the Last Year: Never true    Ran Out of Food in the Last Year: Never true  Transportation Needs: No Transportation  Needs (07/05/2023)   [facility]    In the past 12 months, has lack of transportation kept you from medical appointments or from getting medications?: No    Lack of Transportation (Non-Medical): No  Physical Activity: Not on file  Stress: Not on file  Social Connections: Not on file  Intimate Partner Violence: Not At Risk (06/15/2022)   Humiliation, Afraid, Rape, and Kick questionnaire    Fear of Current or Ex-Partner: No    Emotionally Abused: No    Physically Abused: No    Sexually Abused: No   Family History:  Family History  Problem Relation Age of Onset   Hypertension Mother    Heart disease Mother    Dementia Mother    Heart disease Father    Arthritis Father    Diabetes Father    Hypertension Father    Pancreatic cancer Brother 35       pancreatic   Other Brother        automobile accident   Throat cancer Maternal Uncle    Brain cancer Paternal Aunt 81       malignant brain cancer   Stroke Maternal Grandmother    Heart disease Maternal Grandfather    Pancreatic cancer Cousin 79       pancreatic (maternal first cousin)   Pancreatic cancer Cousin 21       pancreatic, smoker (maternal first cousin)   Breast cancer Cousin 60       breast (maternal first cousin)   Ovarian cancer Cousin    Uterine cancer Cousin 41       uterine (paternal first cousin)   Cancer Cousin 48       unknown primary (paternal first cousin)   Bladder Cancer Cousin 26       (paternal first cousin)   Lung cancer Cousin    Throat cancer Cousin    Prostate cancer Cousin        (maternal first cousin)   Stomach cancer Cousin 52       (maternal first cousin)   Cancer Other 21  pancreatic neuroendocrine carcinoma   Colon cancer Neg Hx    Neuropathy Neg Hx     Review of Systems: Constitutional: Doesn't report fevers, chills or abnormal weight loss Eyes: Doesn't report blurriness of vision Ears, nose, mouth, throat, and face:  Doesn't report sore throat Respiratory: Doesn't report cough, dyspnea or wheezes Cardiovascular: Doesn't report palpitation, chest discomfort  Gastrointestinal:  Doesn't report nausea, constipation, diarrhea GU: Doesn't report incontinence Skin: Doesn't report skin rashes Neurological: Per HPI Musculoskeletal: Doesn't report joint pain Behavioral/Psych: Doesn't report anxiety  Physical Exam: Vitals:   10/31/23 0913  BP: 139/60  Pulse: 67  Resp: 17  Temp: 97.8 F (36.6 C)  SpO2: 98%    KPS: 70. General: Alert, cooperative, pleasant, in no acute distress Head: Normal EENT: No conjunctival injection or scleral icterus.  Lungs: Resp effort normal Cardiac: Regular rate Abdomen: Non-distended abdomen Skin: No rashes cyanosis or petechiae. Extremities: No clubbing or edema  Neurologic Exam: Mental Status: Awake, alert, attentive to examiner. Oriented to self and environment. Language is fluent but comprehension is markedly impaired.  Anterograde amnesia is noted, mild neglect, elements of agnosia. Cranial Nerves: Visual acuity is grossly normal. Visual fields are full. Extra-ocular movements intact. No ptosis. Face is symmetric Motor: Tone and bulk are normal. Power is full in both arms and legs. Reflexes are symmetric, no pathologic reflexes present.  Sensory: Intact to light touch Gait: Normal.   Labs: I have reviewed the data as listed    Component Value Date/Time   NA 139 09/19/2023 0834   K 4.0 09/19/2023 0834   CL 103 09/19/2023 0834   CO2 31 09/19/2023 0834   GLUCOSE 188 (H) 09/19/2023 0834   BUN 19 09/19/2023 0834   CREATININE 0.73 09/19/2023 0834   CREATININE 0.71 01/04/2016 0739   CALCIUM 9.4 09/19/2023 0834   PROT 6.8 09/19/2023 0834   PROT 6.7 10/03/2021 0918   ALBUMIN 4.2 09/19/2023 0834   AST 11 (L) 09/19/2023 0834   ALT 12 09/19/2023 0834   ALKPHOS 54 09/19/2023 0834   BILITOT 0.5 09/19/2023 0834   GFRNONAA >60 09/19/2023 0834   GFRAA >60 10/23/2019  1004   Lab Results  Component Value Date   WBC 5.2 09/19/2023   NEUTROABS 3.6 09/19/2023   HGB 14.0 09/19/2023   HCT 42.1 09/19/2023   MCV 82.1 09/19/2023   PLT 165 09/19/2023    Imaging:  [facility] Clinician Interpretation: I have personally reviewed the CNS images as listed.  My interpretation, in the context of the patient's clinical presentation, is treatment effect vs true progression  MR BRAIN W WO CONTRAST Result Date: 10/30/2023 CLINICAL DATA:  78 year old female glioblastoma IDH wild type. Increased enhancement in the residual left temporal lobe which was reportedly biopsied with pathology revealing radiation necrosis. Restaging. EXAM: MRI HEAD WITHOUT AND WITH CONTRAST TECHNIQUE: Multiplanar, multiecho pulse sequences of the brain and surrounding structures were obtained without and with intravenous contrast. CONTRAST:  6.68mL GADAVIST GADOBUTROL 1 MMOL/ML IV SOLN COMPARISON:  Brain MRI 09/18/2023 and earlier. FINDINGS: Brain: Left temporal lobe resection redemonstrated. T2 and FLAIR hyperintensity tracking posteriorly and cephalad from the resection margin has mildly progressed since December as seen on series 9, images 16 and 20, series 9, image 29, and some of these areas are slightly increased from last month also. No regional mass effect. Following contrast irregular, nodular enhancement along the posterior residual left temporal horn in an area of 2-2.5 cm also appears subtly increased from last month. Compare series 17, image 12  2 series 17, images 14 and 15 previously. Minimally heterogeneous diffusion in the area. Regional hemosiderin is stable. No ependymal enhancement elsewhere. Stable overlying post craniotomy changes including mild smooth dural thickening. Stable gray and white matter signal elsewhere. No superimposed restricted diffusion to suggest acute infarction. No midline shift, acute intracranial hemorrhage. Cervicomedullary junction and pituitary are within normal limits.  Vascular: Major intracranial vascular flow voids are stable. Following contrast the major dural venous sinuses are enhancing and appear to be patent. Skull and upper cervical spine: Negative aside from craniotomy. Negative for age visible cervical spine, spinal cord. Sinuses/Orbits: Stable. Left sphenoid and right maxillary sinus disease. Other: Mild left mastoid effusion is stable. Otherwise negative visible internal auditory structures. IMPRESSION: 1. Further mild progression of irregular enhancement along the residual left temporal horn and in regional T2/FLAIR hyperintensity. No regional mass effect. Clinical notes state history of biopsy revealing radiation necrosis, but recommend continued MRI surveillance. 2. No new intracranial abnormality. Electronically Signed   By: Odessa Fleming M.D.   On: 10/30/2023 10:28    Assessment/Plan Glioblastoma of temporal lobe (HCC)  Yvonne [name] is clinically stable today.  MRI brain demonstrates ongoing subtle increase in enhancement within recently biopsied left temporal nodular enhancing focus.  This is most likely c/w recurrent tumor at this point.    We recommended restarting treatment with Temozolomide 150 mg/m2, on for five days and off for twenty three days in twenty eight day cycles. The patient will have a complete blood count performed on days 21 and 28 of each cycle, and a comprehensive metabolic panel performed on day 28 of each cycle. Labs may need to be performed more often. Zofran will prescribed for home use for nausea/vomiting.   She was not tolerant of 200mg /m2 dose level previously.  Informed consent was obtained verbally at bedside to proceed with oral chemotherapy.  Chemotherapy should be held for the following:  ANC less than 1,000  Platelets less than 100,000  LFT or creatinine greater than 2x ULN  If clinical concerns/contraindications develop  We ask that YAMILA CRAGIN return to clinic in 4 weeks prior to cycle #2, or sooner  as needed.  All questions were answered. The patient knows to call the clinic with any problems, questions or concerns. No barriers to learning were detected.  The total time spent in the encounter was 40 minutes and more than 50% was on counseling and review of test results   Henreitta Leber, MD Medical Director of Neuro-Oncology [facility] 10/31/23 9:06 AM

## 2023-10-31 NOTE — Progress Notes (Signed)
 Oral Chemotherapy Pharmacist Encounter  Patient's daugther was counseled under telephone encounter from 10/31/23.  Sherry Ruffing, PharmD, BCPS, BCOP Hematology/Oncology Clinical Pharmacist [facility] Oral Chemotherapy Navigation Clinics [phone] 10/31/2023 11:02 AM

## 2023-10-31 NOTE — Progress Notes (Signed)
 Specialty Pharmacy Initial Fill Coordination Note  Yvonne [name] is a 78 y.o. female contacted today regarding refills of specialty medication(s) Temozolomide (TEMODAR) .  Patient requested [name] at [facility] [place]  on 11/01/23   Medication will be filled on 11/01/23.   Patient is aware of $50 copayment.

## 2023-11-01 ENCOUNTER — Other Ambulatory Visit: Payer: Self-pay

## 2023-11-06 DIAGNOSIS — H6123 Impacted cerumen, bilateral: Secondary | ICD-10-CM | POA: Diagnosis not present

## 2023-11-25 ENCOUNTER — Other Ambulatory Visit (HOSPITAL_COMMUNITY): Payer: Self-pay

## 2023-11-26 ENCOUNTER — Other Ambulatory Visit: Payer: Self-pay

## 2023-11-29 ENCOUNTER — Other Ambulatory Visit: Payer: Self-pay | Admitting: *Deleted

## 2023-11-29 DIAGNOSIS — C712 Malignant neoplasm of temporal lobe: Secondary | ICD-10-CM

## 2023-12-02 ENCOUNTER — Other Ambulatory Visit: Payer: Self-pay

## 2023-12-02 ENCOUNTER — Telehealth: Payer: Self-pay | Admitting: Pharmacy Technician

## 2023-12-02 ENCOUNTER — Inpatient Hospital Stay: Attending: Internal Medicine

## 2023-12-02 ENCOUNTER — Other Ambulatory Visit: Payer: Self-pay | Admitting: Pharmacy Technician

## 2023-12-02 ENCOUNTER — Telehealth: Payer: Self-pay | Admitting: Internal Medicine

## 2023-12-02 ENCOUNTER — Other Ambulatory Visit (HOSPITAL_COMMUNITY): Payer: Self-pay

## 2023-12-02 ENCOUNTER — Inpatient Hospital Stay: Admitting: Internal Medicine

## 2023-12-02 VITALS — BP 148/61 | HR 64 | Temp 97.5°F | Resp 13 | Wt 148.3 lb

## 2023-12-02 DIAGNOSIS — Z87891 Personal history of nicotine dependence: Secondary | ICD-10-CM | POA: Insufficient documentation

## 2023-12-02 DIAGNOSIS — Z9221 Personal history of antineoplastic chemotherapy: Secondary | ICD-10-CM | POA: Insufficient documentation

## 2023-12-02 DIAGNOSIS — E039 Hypothyroidism, unspecified: Secondary | ICD-10-CM | POA: Diagnosis not present

## 2023-12-02 DIAGNOSIS — I10 Essential (primary) hypertension: Secondary | ICD-10-CM | POA: Diagnosis not present

## 2023-12-02 DIAGNOSIS — R112 Nausea with vomiting, unspecified: Secondary | ICD-10-CM | POA: Insufficient documentation

## 2023-12-02 DIAGNOSIS — G4733 Obstructive sleep apnea (adult) (pediatric): Secondary | ICD-10-CM | POA: Diagnosis not present

## 2023-12-02 DIAGNOSIS — Z79890 Hormone replacement therapy (postmenopausal): Secondary | ICD-10-CM | POA: Insufficient documentation

## 2023-12-02 DIAGNOSIS — Z79899 Other long term (current) drug therapy: Secondary | ICD-10-CM | POA: Insufficient documentation

## 2023-12-02 DIAGNOSIS — E785 Hyperlipidemia, unspecified: Secondary | ICD-10-CM | POA: Diagnosis not present

## 2023-12-02 DIAGNOSIS — Z801 Family history of malignant neoplasm of trachea, bronchus and lung: Secondary | ICD-10-CM | POA: Diagnosis not present

## 2023-12-02 DIAGNOSIS — Z800 Family history of malignant neoplasm of digestive organs: Secondary | ICD-10-CM | POA: Diagnosis not present

## 2023-12-02 DIAGNOSIS — G629 Polyneuropathy, unspecified: Secondary | ICD-10-CM | POA: Diagnosis not present

## 2023-12-02 DIAGNOSIS — Z85820 Personal history of malignant melanoma of skin: Secondary | ICD-10-CM | POA: Diagnosis not present

## 2023-12-02 DIAGNOSIS — I341 Nonrheumatic mitral (valve) prolapse: Secondary | ICD-10-CM | POA: Diagnosis not present

## 2023-12-02 DIAGNOSIS — C712 Malignant neoplasm of temporal lobe: Secondary | ICD-10-CM

## 2023-12-02 DIAGNOSIS — Z803 Family history of malignant neoplasm of breast: Secondary | ICD-10-CM | POA: Diagnosis not present

## 2023-12-02 DIAGNOSIS — C719 Malignant neoplasm of brain, unspecified: Secondary | ICD-10-CM

## 2023-12-02 DIAGNOSIS — Z7984 Long term (current) use of oral hypoglycemic drugs: Secondary | ICD-10-CM | POA: Insufficient documentation

## 2023-12-02 LAB — CMP (CANCER CENTER ONLY)
ALT: 14 U/L (ref 0–44)
AST: 13 U/L — ABNORMAL LOW (ref 15–41)
Albumin: 4.6 g/dL (ref 3.5–5.0)
Alkaline Phosphatase: 49 U/L (ref 38–126)
Anion gap: 7 (ref 5–15)
BUN: 16 mg/dL (ref 8–23)
CO2: 29 mmol/L (ref 22–32)
Calcium: 9.6 mg/dL (ref 8.9–10.3)
Chloride: 104 mmol/L (ref 98–111)
Creatinine: 0.7 mg/dL (ref 0.44–1.00)
GFR, Estimated: >60 mL/min (ref >60)
Glucose, Bld: 201 mg/dL — ABNORMAL HIGH (ref 70–99)
Potassium: 3.8 mmol/L (ref 3.5–5.1)
Sodium: 140 mmol/L (ref 135–145)
Total Bilirubin: 0.5 mg/dL (ref 0.0–1.2)
Total Protein: 7.1 g/dL (ref 6.5–8.1)

## 2023-12-02 LAB — CBC WITH DIFFERENTIAL (CANCER CENTER ONLY)
Abs Immature Granulocytes: 0.01 10*3/uL (ref 0.00–0.07)
Basophils Absolute: 0 10*3/uL (ref 0.0–0.1)
Basophils Relative: 1 %
Eosinophils Absolute: 0.3 10*3/uL (ref 0.0–0.5)
Eosinophils Relative: 5 %
HCT: 42.7 % (ref 36.0–46.0)
Hemoglobin: 14.4 g/dL (ref 12.0–15.0)
Immature Granulocytes: 0 %
Lymphocytes Relative: 14 %
Lymphs Abs: 0.7 10*3/uL (ref 0.7–4.0)
MCH: 27.3 pg (ref 26.0–34.0)
MCHC: 33.7 g/dL (ref 30.0–36.0)
MCV: 81 fL (ref 80.0–100.0)
Monocytes Absolute: 0.6 10*3/uL (ref 0.1–1.0)
Monocytes Relative: 11 %
Neutro Abs: 3.5 10*3/uL (ref 1.7–7.7)
Neutrophils Relative %: 69 %
Platelet Count: 120 10*3/uL — ABNORMAL LOW (ref 150–400)
RBC: 5.27 MIL/uL — ABNORMAL HIGH (ref 3.87–5.11)
RDW: 14.8 % (ref 11.5–15.5)
WBC Count: 5 10*3/uL (ref 4.0–10.5)
nRBC: 0 % (ref 0.0–0.2)

## 2023-12-02 MED ORDER — TEMOZOLOMIDE 100 MG PO CAPS
300.0000 mg | ORAL_CAPSULE | Freq: Every day | ORAL | 0 refills | Status: DC
  Filled 2023-12-02: qty 15, 28d supply, fill #0

## 2023-12-02 NOTE — Progress Notes (Signed)
 Specialty Pharmacy Refill Coordination Note  Yvonne [name] is a 78 y.o. female contacted today regarding refills of specialty medication(s) Temozolomide  (TEMODAR )  Spoke with Daughter.  Patient requested Pickup at [facility] at [place] date: 12/02/23   Medication will be filled on 12/02/23.   Needs PA. Encounter routed to [place].

## 2023-12-02 NOTE — Progress Notes (Signed)
 Specialty Pharmacy Ongoing Clinical Assessment Note  Yvonne [name] is a 78 y.o. female who is being followed by the specialty pharmacy service for RxSp Oncology   Patient's specialty medication(s) reviewed today: Temozolomide  (TEMODAR )   Missed doses in the last 4 weeks: 0   Patient/Caregiver did not have any additional questions or concerns.   Therapeutic benefit summary: Unable to assess   Adverse events/side effects summary: Experienced adverse events/side effects (mild, tolerable constipation. resolved after active treatment days were finished)   Patient's therapy is appropriate to: Continue    Goals Addressed             This Visit's Progress    Slow Disease Progression       Patient is unable to be assessed as therapy was recently initiated. Patient will maintain adherence          Follow up:  3 months  [name] M Ariyah Sedlack Specialty Pharmacist

## 2023-12-02 NOTE — Telephone Encounter (Signed)
 Genevia Kern scheduled Yvonne [name]'s appointments and they are aware of all appointment details.

## 2023-12-02 NOTE — Progress Notes (Signed)
 [facility] at [facility] [address]  [place], [place] [address] [phone]   Interval Evaluation  Date of Service: 12/02/23 Patient Name: Yvonne [name] Patient MRN: [id number] Patient DOB: 1946/02/05 Provider: Mamie Searles, MD  Identifying Statement:  Yvonne [name] is a 78 y.o. female with left temporal glioblastoma    Oncologic History: Oncology History  Glioblastoma, IDH-wildtype (HCC)  04/18/2022 Surgery   Left temporal craniotomy, resection with Dr. Reather Campbell at [facility].  Path is glioblastoma, IDHwt   05/16/2022 - 06/29/2022 Radiation Therapy   6 weeks IMRT with concurrent Temodar  Jeryl Moris)   07/26/2022 - 01/24/2023 Chemotherapy   Completes 6 cycles of 5-day Temodar     07/04/2023 Surgery   Biopsy at [facility] Reather Campbell) for new nodular enhancement; path is mostly radiation necrosis   10/31/2023 Progression   Progression of disease   11/01/2023 -  Chemotherapy   Resumes 5-day Temodar       Biomarkers:  MGMT Unknown.  IDH 1/2 Wild type.  EGFR Unknown  TERT Unknown   Interval History: TOMECA HELM presents today for follow up after resumption of 5-day Temodar  cycle #1.  Tolerated treatment well overall. No new or progressive symptoms, language issues have not progressed.  Issues with short term memory, irritable behavior are stable.  No further seizures, no headaches.    H+P (05/03/22) Patient presented to medical attention in August 2023 with episode of sudden onset speech impairment.  Initially treated as stroke with TPA, CNS imaging later demonstrated enhancing mass in the left temporal lobe, c/w primary brain tumor.  She underwent resection at [facility] with Dr. Reather Campbell after second opinion there.  Following surgery she experienced some speech impairment; this has improved modestly, but she still experiences difficulty with finding correct words.  Also struggling with short term memory.  She is currently living with her daughter given  cognitive limitations, need for some supervision.  No further seizures with the Keppra  500mg  twice per day.  Currently on decadron  2mg  daily per discharge taper from [facility].    Medications: Current Outpatient Medications on File Prior to Visit  Medication Sig Dispense Refill   acetaminophen  (TYLENOL ) 500 MG tablet Take 1,000 mg by mouth every 8 (eight) hours as needed for moderate pain.     Cholecalciferol  (VITAMIN D ) 50 MCG (2000 UT) tablet Take 2,000 Units by mouth daily.      fluticasone  (FLONASE ) 50 MCG/ACT nasal spray Place 2 sprays into both nostrils at bedtime.     furosemide  (LASIX ) 20 MG tablet Take 10 mg by mouth as needed for edema.     gabapentin  (NEURONTIN ) 300 MG capsule Take 300 mg by mouth at bedtime.      JARDIANCE 10 MG TABS tablet 1 tablet Orally Once a day     levothyroxine  (SYNTHROID ) 88 MCG tablet Take 88 mcg by mouth daily before breakfast.     metFORMIN (GLUCOPHAGE) 500 MG tablet Take 500 mg by mouth 2 (two) times daily.     metoprolol  succinate (TOPROL -XL) 100 MG 24 hr tablet Take 100 mg by mouth daily.     metroNIDAZOLE  (METROCREAM ) 0.75 % cream Apply 1 application  topically daily as needed.     ondansetron  (ZOFRAN ) 8 MG tablet Take 1 tablet (8 mg total) by mouth every 8 (eight) hours as needed for nausea or vomiting. 30 tablet 1   prochlorperazine  (COMPAZINE ) 10 MG tablet Take 1 tablet (10 mg total) by mouth every 6 (six) hours as needed for nausea or vomiting.  30 tablet 0   rosuvastatin  (CRESTOR ) 5 MG tablet Take 5 mg by mouth daily.     Current Facility-Administered Medications on File Prior to Visit  Medication Dose Route Frequency Provider Last Rate Last Admin   influenza vaccine adjuvanted (FLUAD) injection 0.5 mL  0.5 mL Intramuscular Once Elverta Dimiceli K, MD        Allergies:  Allergies  Allergen Reactions   Tobramycin Hives   Sulfa Antibiotics     Stomach upset   Erythromycin Nausea Only   Past Medical History:  Past Medical History:  Diagnosis  Date   Anxiety    Chest pressure    Degenerative arthritis    Deviated septum    Diabetes mellitus without complication (HCC)    diet controlled   Diabetic neuropathy (HCC) 12/03/2017   DJD (degenerative joint disease) of knee    Family history of bladder cancer    Family history of brain cancer    Family history of breast cancer    Family history of ovarian cancer    Family history of prostate cancer    Family history of stomach cancer    Family history of throat cancer    Family history of uterine cancer    Graves disease    RADIOACTIVE IODINE 1983   H/O hiatal hernia    Heart palpitations    History of diastolic dysfunction    per echo in 2011   Hyperlipidemia    Hypertension    Hypothyroidism    LVH (left ventricular hypertrophy)    per echo in 2011   Mitral valve prolapse    Neuropathy, peripheral    DR. Tilda Fogo 12/15   Normal nuclear stress test 2011   OSA (obstructive sleep apnea)    Polyneuropathy in other diseases classified elsewhere (HCC) 05/21/2013   Rosacea 2019   MELANOMA CHEST REMOVED IN SITU   Sleep difficulties    Tinnitus    Past Surgical History:  Past Surgical History:  Procedure Laterality Date   ABDOMINAL HYSTERECTOMY  2000   TOTAL   APPENDECTOMY  1989   CARDIOVASCULAR STRESS TEST  11/09/2009   EF 75%   CARPAL TUNNEL RELEASE  2002   bilateral   COLONOSCOPY WITH PROPOFOL  N/A 10/23/2018   Procedure: COLONOSCOPY WITH PROPOFOL ;  Surgeon: Janel Medford, MD;  Location: [facility];  Service: Endoscopy;  Laterality: N/A;   ESOPHAGOGASTRODUODENOSCOPY N/A 11/16/2021   Procedure: ESOPHAGOGASTRODUODENOSCOPY (EGD);  Surgeon: Janel Medford, MD;  Location: [facility];  Service: Endoscopy;  Laterality: N/A;   ESOPHAGOGASTRODUODENOSCOPY (EGD) WITH PROPOFOL  N/A 10/23/2018   Procedure: ESOPHAGOGASTRODUODENOSCOPY (EGD) WITH PROPOFOL ;  Surgeon: Janel Medford, MD;  Location: [facility];  Service: Endoscopy;  Laterality: N/A;   EUS N/A 10/23/2018    Procedure: UPPER ENDOSCOPIC ULTRASOUND (EUS) RADIAL;  Surgeon: Janel Medford, MD;  Location: [facility];  Service: Endoscopy;  Laterality: N/A;   EUS N/A 11/16/2021   Procedure: UPPER ENDOSCOPIC ULTRASOUND (EUS) RADIAL;  Surgeon: Janel Medford, MD;  Location: [facility];  Service: Endoscopy;  Laterality: N/A;   MASS EXCISION Left 06/02/2019   Procedure: EXCISION OF SUBCUTANEOUS ABDOMINAL WALL MASSES LEFT LOWER QUADRANT;  Surgeon: Dareen Ebbing, MD;  Location: [facility] OR;  Service: General;  Laterality: Left;   TONSILLECTOMY  1958   TRANSTHORACIC ECHOCARDIOGRAM  11/01/2009   NORMAL LV FILLING AND DIASTOLIC DYSFUNCTION AND MILD AORTIC SCLEROSIS AND TRACE MITRAL REGURGITATION   Social History:  Social History   Socioeconomic History   Marital status: Married  Spouse name: Francoise Ishihara   Number of children: 1   Years of education: COLLEGE   Highest education level: Not on file  Occupational History   Occupation: Retired  Tobacco Use   Smoking status: Former    Current packs/day: 0.00    Average packs/day: 0.5 packs/day for 10.0 years (5.0 ttl pk-yrs)    Types: Cigarettes    Start date: 08/13/1965    Quit date: 08/14/1975    Years since quitting: 48.3   Smokeless tobacco: Never  Vaping Use   Vaping status: Never Used  Substance and Sexual Activity   Alcohol use: Yes    Comment: one glass of wine a week   Drug use: No   Sexual activity: Not on file  Other Topics Concern   Not on file  Social History Narrative   Lives at home w/ her husband, Francoise Ishihara.   Right-handed.   Drinks no caffeine but does eat chocolate occasionally.   epworth sleepiness scale = 3 (01/10/16)   Social Drivers of Health   Financial Resource Strain: Low Risk  (07/05/2023)   Received from [facility]   Overall Financial Resource Strain (CARDIA)    Difficulty of Paying Living Expenses: Not hard at all  Food Insecurity: No Food Insecurity (07/05/2023)   Received from [facility]    Hunger Vital Sign    Worried About Running Out of Food in the Last Year: Never true    Ran Out of Food in the Last Year: Never true  Transportation Needs: No Transportation Needs (07/05/2023)   Received from [facility] - Transportation    In the past 12 months, has lack of transportation kept you from medical appointments or from getting medications?: No    Lack of Transportation (Non-Medical): No  Physical Activity: Not on file  Stress: Not on file  Social Connections: Not on file  Intimate Partner Violence: Not At Risk (06/15/2022)   Humiliation, Afraid, Rape, and Kick questionnaire    Fear of Current or Ex-Partner: No    Emotionally Abused: No    Physically Abused: No    Sexually Abused: No   Family History:  Family History  Problem Relation Age of Onset   Hypertension Mother    Heart disease Mother    Dementia Mother    Heart disease Father    Arthritis Father    Diabetes Father    Hypertension Father    Pancreatic cancer Brother 68       pancreatic   Other Brother        automobile accident   Throat cancer Maternal Uncle    Brain cancer Paternal Aunt 74       malignant brain cancer   Stroke Maternal Grandmother    Heart disease Maternal Grandfather    Pancreatic cancer Cousin 52       pancreatic (maternal first cousin)   Pancreatic cancer Cousin 60       pancreatic, smoker (maternal first cousin)   Breast cancer Cousin 18       breast (maternal first cousin)   Ovarian cancer Cousin    Uterine cancer Cousin 56       uterine (paternal first cousin)   Cancer Cousin 54       unknown primary (paternal first cousin)   Bladder Cancer Cousin 58       (paternal first cousin)   Lung cancer Cousin    Throat cancer Cousin    Prostate  cancer Cousin        (maternal first cousin)   Stomach cancer Cousin 61       (maternal first cousin)   Cancer Other 7       pancreatic neuroendocrine carcinoma   Colon cancer Neg Hx    Neuropathy Neg Hx      Review of Systems: Constitutional: Doesn't report fevers, chills or abnormal weight loss Eyes: Doesn't report blurriness of vision Ears, nose, mouth, throat, and face: Doesn't report sore throat Respiratory: Doesn't report cough, dyspnea or wheezes Cardiovascular: Doesn't report palpitation, chest discomfort  Gastrointestinal:  Doesn't report nausea, constipation, diarrhea GU: Doesn't report incontinence Skin: Doesn't report skin rashes Neurological: Per HPI Musculoskeletal: Doesn't report joint pain Behavioral/Psych: Doesn't report anxiety  Physical Exam: Vitals:   12/02/23 0943  BP: (!) 148/61  Pulse: 64  Resp: 13  Temp: (!) 97.5 F (36.4 C)  SpO2: 99%    KPS: 70. General: Alert, cooperative, pleasant, in no acute distress Head: Normal EENT: No conjunctival injection or scleral icterus.  Lungs: Resp effort normal Cardiac: Regular rate Abdomen: Non-distended abdomen Skin: No rashes cyanosis or petechiae. Extremities: No clubbing or edema  Neurologic Exam: Mental Status: Awake, alert, attentive to examiner. Oriented to self and environment. Language is fluent but comprehension is markedly impaired.  Anterograde amnesia is noted, mild neglect, elements of agnosia. Cranial Nerves: Visual acuity is grossly normal. Visual fields are full. Extra-ocular movements intact. No ptosis. Face is symmetric Motor: Tone and bulk are normal. Power is full in both arms and legs. Reflexes are symmetric, no pathologic reflexes present.  Sensory: Intact to light touch Gait: Normal.   Labs: I have reviewed the data as listed    Component Value Date/Time   NA 139 09/19/2023 0834   K 4.0 09/19/2023 0834   CL 103 09/19/2023 0834   CO2 31 09/19/2023 0834   GLUCOSE 188 (H) 09/19/2023 0834   BUN 19 09/19/2023 0834   CREATININE 0.73 09/19/2023 0834   CREATININE 0.71 01/04/2016 0739   CALCIUM  9.4 09/19/2023 0834   PROT 6.8 09/19/2023 0834   PROT 6.7 10/03/2021 0918   ALBUMIN 4.2  09/19/2023 0834   AST 11 (L) 09/19/2023 0834   ALT 12 09/19/2023 0834   ALKPHOS 54 09/19/2023 0834   BILITOT 0.5 09/19/2023 0834   GFRNONAA >60 09/19/2023 0834   GFRAA >60 10/23/2019 1004   Lab Results  Component Value Date   WBC 5.0 12/02/2023   NEUTROABS 3.5 12/02/2023   HGB 14.4 12/02/2023   HCT 42.7 12/02/2023   MCV 81.0 12/02/2023   PLT 120 (L) 12/02/2023     Assessment/Plan Glioblastoma of temporal lobe (HCC)  KWANZA CANCELLIERE is clinically stable today, now having completed cycle #1 resumed 5-day Temodar .  Labs demonstrates drop in platelets to 120, but within safe range for continued dosing.    We recommended continuing treatment with cycle #2 Temozolomide  150 mg/m2, on for five days and off for twenty three days in twenty eight day cycles. The patient will have a complete blood count performed on days 21 and 28 of each cycle, and a comprehensive metabolic panel performed on day 28 of each cycle. Labs may need to be performed more often. Zofran  will prescribed for home use for nausea/vomiting.   She was not tolerant of 200mg /m2 dose level previously.  Informed consent was obtained verbally at bedside to proceed with oral chemotherapy.  Chemotherapy should be held for the following:  ANC less than 1,000  Platelets  less than 100,000  LFT or creatinine greater than 2x ULN  If clinical concerns/contraindications develop  We ask that HARLEA GOETZINGER return to clinic in 4 weeks with MRI brain prior to cycle #2, or sooner as needed.  All questions were answered. The patient knows to call the clinic with any problems, questions or concerns. No barriers to learning were detected.  The total time spent in the encounter was 30 minutes and more than 50% was on counseling and review of test results   Mamie Searles, MD Medical Director of Neuro-Oncology [facility] at [facility] 12/02/23 9:46 AM

## 2023-12-02 NOTE — Progress Notes (Signed)
 Info updated. No PA needed. Call center pharmacist notified.

## 2023-12-02 NOTE — Telephone Encounter (Signed)
 Oral Oncology Patient Advocate Encounter   Received notification that prior authorization for Temozolomide  is required.   PA submitted on 12/02/23 Key [id number] Status is pending     Roda Cirri, CPhT Specialty Pharmacy Patient Advocate Phone: [phone] Fax: [phone]

## 2023-12-03 ENCOUNTER — Other Ambulatory Visit (HOSPITAL_COMMUNITY): Payer: Self-pay

## 2023-12-03 ENCOUNTER — Encounter: Payer: Self-pay | Admitting: Internal Medicine

## 2023-12-03 ENCOUNTER — Other Ambulatory Visit: Payer: Self-pay

## 2023-12-04 ENCOUNTER — Other Ambulatory Visit (HOSPITAL_COMMUNITY): Payer: Self-pay

## 2023-12-04 ENCOUNTER — Encounter: Payer: Self-pay | Admitting: Internal Medicine

## 2023-12-04 NOTE — Telephone Encounter (Addendum)
 Oral Oncology Patient Advocate Encounter  Prior Authorization for Temozolomide  has been approved.    PA#   Effective dates: 08/14/23 through 08/12/24  Patients co-pay is $50.00.   Patient may continue to fill with [facility] as they have been.    Hansel Ley, CPhT Oncology Pharmacy Patient Advocate  [facility]  [phone] (phone) [phone] (fax) 12/04/2023 10:00 AM

## 2023-12-23 ENCOUNTER — Other Ambulatory Visit: Payer: Self-pay | Admitting: *Deleted

## 2023-12-23 DIAGNOSIS — C712 Malignant neoplasm of temporal lobe: Secondary | ICD-10-CM

## 2023-12-27 ENCOUNTER — Other Ambulatory Visit: Payer: Self-pay

## 2023-12-30 ENCOUNTER — Inpatient Hospital Stay: Attending: Internal Medicine

## 2023-12-30 ENCOUNTER — Ambulatory Visit: Admitting: Internal Medicine

## 2023-12-30 ENCOUNTER — Other Ambulatory Visit

## 2023-12-30 ENCOUNTER — Ambulatory Visit (HOSPITAL_COMMUNITY)
Admission: RE | Admit: 2023-12-30 | Discharge: 2023-12-30 | Disposition: A | Discharge status: Home / Self Care | Source: Ambulatory Visit | Attending: Internal Medicine | Admitting: Internal Medicine

## 2023-12-30 ENCOUNTER — Inpatient Hospital Stay: Admitting: Internal Medicine

## 2023-12-30 VITALS — BP 126/55 | HR 65 | Temp 97.9°F | Resp 16 | Wt 145.2 lb

## 2023-12-30 DIAGNOSIS — Z79899 Other long term (current) drug therapy: Secondary | ICD-10-CM | POA: Diagnosis not present

## 2023-12-30 DIAGNOSIS — I10 Essential (primary) hypertension: Secondary | ICD-10-CM | POA: Diagnosis not present

## 2023-12-30 DIAGNOSIS — E039 Hypothyroidism, unspecified: Secondary | ICD-10-CM | POA: Insufficient documentation

## 2023-12-30 DIAGNOSIS — C719 Malignant neoplasm of brain, unspecified: Secondary | ICD-10-CM | POA: Diagnosis not present

## 2023-12-30 DIAGNOSIS — C712 Malignant neoplasm of temporal lobe: Secondary | ICD-10-CM | POA: Insufficient documentation

## 2023-12-30 DIAGNOSIS — R9082 White matter disease, unspecified: Secondary | ICD-10-CM | POA: Diagnosis not present

## 2023-12-30 DIAGNOSIS — Z801 Family history of malignant neoplasm of trachea, bronchus and lung: Secondary | ICD-10-CM | POA: Diagnosis not present

## 2023-12-30 DIAGNOSIS — Z800 Family history of malignant neoplasm of digestive organs: Secondary | ICD-10-CM | POA: Diagnosis not present

## 2023-12-30 DIAGNOSIS — Z7984 Long term (current) use of oral hypoglycemic drugs: Secondary | ICD-10-CM | POA: Insufficient documentation

## 2023-12-30 DIAGNOSIS — Z85820 Personal history of malignant melanoma of skin: Secondary | ICD-10-CM | POA: Insufficient documentation

## 2023-12-30 DIAGNOSIS — Z9221 Personal history of antineoplastic chemotherapy: Secondary | ICD-10-CM | POA: Insufficient documentation

## 2023-12-30 DIAGNOSIS — D696 Thrombocytopenia, unspecified: Secondary | ICD-10-CM | POA: Insufficient documentation

## 2023-12-30 DIAGNOSIS — Z79890 Hormone replacement therapy (postmenopausal): Secondary | ICD-10-CM | POA: Diagnosis not present

## 2023-12-30 DIAGNOSIS — G4733 Obstructive sleep apnea (adult) (pediatric): Secondary | ICD-10-CM | POA: Insufficient documentation

## 2023-12-30 DIAGNOSIS — E1140 Type 2 diabetes mellitus with diabetic neuropathy, unspecified: Secondary | ICD-10-CM | POA: Insufficient documentation

## 2023-12-30 DIAGNOSIS — Z87891 Personal history of nicotine dependence: Secondary | ICD-10-CM | POA: Insufficient documentation

## 2023-12-30 DIAGNOSIS — R112 Nausea with vomiting, unspecified: Secondary | ICD-10-CM | POA: Insufficient documentation

## 2023-12-30 DIAGNOSIS — I341 Nonrheumatic mitral (valve) prolapse: Secondary | ICD-10-CM | POA: Insufficient documentation

## 2023-12-30 DIAGNOSIS — Z923 Personal history of irradiation: Secondary | ICD-10-CM | POA: Diagnosis not present

## 2023-12-30 DIAGNOSIS — E785 Hyperlipidemia, unspecified: Secondary | ICD-10-CM | POA: Diagnosis not present

## 2023-12-30 LAB — CBC WITH DIFFERENTIAL (CANCER CENTER ONLY)
Abs Immature Granulocytes: 0.01 10*3/uL (ref 0.00–0.07)
Basophils Absolute: 0 10*3/uL (ref 0.0–0.1)
Basophils Relative: 1 %
Eosinophils Absolute: 0.2 10*3/uL (ref 0.0–0.5)
Eosinophils Relative: 3 %
HCT: 41.9 % (ref 36.0–46.0)
Hemoglobin: 14.2 g/dL (ref 12.0–15.0)
Immature Granulocytes: 0 %
Lymphocytes Relative: 10 %
Lymphs Abs: 0.5 10*3/uL — ABNORMAL LOW (ref 0.7–4.0)
MCH: 27.6 pg (ref 26.0–34.0)
MCHC: 33.9 g/dL (ref 30.0–36.0)
MCV: 81.4 fL (ref 80.0–100.0)
Monocytes Absolute: 0.6 10*3/uL (ref 0.1–1.0)
Monocytes Relative: 10 %
Neutro Abs: 4.3 10*3/uL (ref 1.7–7.7)
Neutrophils Relative %: 76 %
Platelet Count: 112 10*3/uL — ABNORMAL LOW (ref 150–400)
RBC: 5.15 MIL/uL — ABNORMAL HIGH (ref 3.87–5.11)
RDW: 15.2 % (ref 11.5–15.5)
WBC Count: 5.7 10*3/uL (ref 4.0–10.5)
nRBC: 0 % (ref 0.0–0.2)

## 2023-12-30 LAB — CMP (CANCER CENTER ONLY)
ALT: 15 U/L (ref 0–44)
AST: 13 U/L — ABNORMAL LOW (ref 15–41)
Albumin: 4.6 g/dL (ref 3.5–5.0)
Alkaline Phosphatase: 49 U/L (ref 38–126)
Anion gap: 6 (ref 5–15)
BUN: 19 mg/dL (ref 8–23)
CO2: 29 mmol/L (ref 22–32)
Calcium: 9.6 mg/dL (ref 8.9–10.3)
Chloride: 105 mmol/L (ref 98–111)
Creatinine: 0.76 mg/dL (ref 0.44–1.00)
GFR, Estimated: >60 mL/min (ref >60)
Glucose, Bld: 181 mg/dL — ABNORMAL HIGH (ref 70–99)
Potassium: 4.1 mmol/L (ref 3.5–5.1)
Sodium: 140 mmol/L (ref 135–145)
Total Bilirubin: 0.5 mg/dL (ref 0.0–1.2)
Total Protein: 7.1 g/dL (ref 6.5–8.1)

## 2023-12-30 MED ORDER — GADOBUTROL 1 MMOL/ML IV SOLN
6.0000 mL | Freq: Once | INTRAVENOUS | Status: AC | PRN
Start: 2023-12-30 — End: 2023-12-30
  Administered 2023-12-30: 6 mL via INTRAVENOUS

## 2023-12-30 MED ORDER — DEXAMETHASONE 4 MG PO TABS: 4.0000 mg | ORAL_TABLET | Freq: Every day | ORAL | 1 refills | Status: DC

## 2023-12-30 NOTE — Progress Notes (Signed)
 [facility] [address]  [place], [place] [address] [phone]   Interval Evaluation  Date of Service: 12/30/23 Patient Name: Yvonne [name] Patient MRN: [id number] Patient DOB: 02-09-1946 Provider: Mamie Searles, MD  Identifying Statement:  Yvonne [name] is a 78 y.o. female with left temporal glioblastoma    Oncologic History: Oncology History  Glioblastoma, IDH-wildtype (HCC)  04/18/2022 Surgery   Left temporal craniotomy, resection with Dr. Reather [name] at [facility].  Path is glioblastoma, IDHwt   05/16/2022 - 06/29/2022 Radiation Therapy   6 weeks IMRT with concurrent Temodar  Yvonne [name])   07/26/2022 - 01/24/2023 Chemotherapy   Completes 6 cycles of 5-day Temodar     07/04/2023 Surgery   Biopsy at [facility] Yvonne [name]) for new nodular enhancement; path is mostly radiation necrosis   10/31/2023 Progression   Progression of disease   11/01/2023 -  Chemotherapy   Resumes 5-day Temodar       Biomarkers:  MGMT Unknown.  IDH 1/2 Wild type.  EGFR Unknown  TERT Unknown   Interval History: Yvonne [name] presents today for follow up after resumption of 5-day Temodar  cycle #2.  Continues to tolerate treatment well overall.  Family has noticed modest decline in word finding ability, overall cognition.  She has not lost any significant function with regards to activities of daily living.  Issues with short term memory, irritable behavior are otherwise stable.  Did have an episode of speech arrest this past week which lasted for several minutes, returned to baseline quickly.  H+P (05/03/22) Patient presented to medical attention in August 2023 with episode of sudden onset speech impairment.  Initially treated as stroke with TPA, CNS imaging later demonstrated enhancing mass in the left temporal lobe, c/w primary brain tumor.  She underwent resection at [facility] with Dr. Reather [name] after second opinion there.  Following surgery she experienced some speech  impairment; this has improved modestly, but she still experiences difficulty with finding correct words.  Also struggling with short term memory.  She is currently living with her daughter given cognitive limitations, need for some supervision.  No further seizures with the Keppra  500mg  twice per day.  Currently on decadron  2mg  daily per discharge taper from [facility].    Medications: Current Outpatient Medications on File Prior to Visit  Medication Sig Dispense Refill   acetaminophen  (TYLENOL ) 500 MG tablet Take 1,000 mg by mouth every 8 (eight) hours as needed for moderate pain.     Cholecalciferol  (VITAMIN D ) 50 MCG (2000 UT) tablet Take 2,000 Units by mouth daily.      fluticasone  (FLONASE ) 50 MCG/ACT nasal spray Place 2 sprays into both nostrils at bedtime.     furosemide  (LASIX ) 20 MG tablet Take 10 mg by mouth as needed for edema.     gabapentin  (NEURONTIN ) 300 MG capsule Take 300 mg by mouth at bedtime.      JARDIANCE 10 MG TABS tablet 1 tablet Orally Once a day     levothyroxine  (SYNTHROID ) 88 MCG tablet Take 88 mcg by mouth daily before breakfast.     metFORMIN (GLUCOPHAGE) 500 MG tablet Take 500 mg by mouth 2 (two) times daily.     metoprolol  succinate (TOPROL -XL) 100 MG 24 hr tablet Take 100 mg by mouth daily.     metroNIDAZOLE  (METROCREAM ) 0.75 % cream Apply 1 application  topically daily as needed.     ondansetron  (ZOFRAN ) 8 MG tablet Take 1 tablet (8 mg total) by mouth every 8 (eight) hours as needed  for nausea or vomiting. 30 tablet 1   prochlorperazine  (COMPAZINE ) 10 MG tablet Take 1 tablet (10 mg total) by mouth every 6 (six) hours as needed for nausea or vomiting. 30 tablet 0   rosuvastatin  (CRESTOR ) 5 MG tablet Take 5 mg by mouth daily.     Current Facility-Administered Medications on File Prior to Visit  Medication Dose Route Frequency Provider Last Rate Last Admin   influenza vaccine adjuvanted (FLUAD) injection 0.5 mL  0.5 mL Intramuscular Once Jule Whitsel K, MD         Allergies:  Allergies  Allergen Reactions   Tobramycin Hives   Sulfa Antibiotics     Stomach upset   Erythromycin Nausea Only   Past Medical History:  Past Medical History:  Diagnosis Date   Anxiety    Chest pressure    Degenerative arthritis    Deviated septum    Diabetes mellitus without complication (HCC)    diet controlled   Diabetic neuropathy (HCC) 12/03/2017   DJD (degenerative joint disease) of knee    Family history of bladder cancer    Family history of brain cancer    Family history of breast cancer    Family history of ovarian cancer    Family history of prostate cancer    Family history of stomach cancer    Family history of throat cancer    Family history of uterine cancer    Graves disease    RADIOACTIVE IODINE 1983   H/O hiatal hernia    Heart palpitations    History of diastolic dysfunction    per echo in 2011   Hyperlipidemia    Hypertension    Hypothyroidism    LVH (left ventricular hypertrophy)    per echo in 2011   Mitral valve prolapse    Neuropathy, peripheral    DR. Tilda Fogo 12/15   Normal nuclear stress test 2011   OSA (obstructive sleep apnea)    Polyneuropathy in other diseases classified elsewhere (HCC) 05/21/2013   Rosacea 2019   MELANOMA CHEST REMOVED IN SITU   Sleep difficulties    Tinnitus    Past Surgical History:  Past Surgical History:  Procedure Laterality Date   ABDOMINAL HYSTERECTOMY  2000   TOTAL   APPENDECTOMY  1989   CARDIOVASCULAR STRESS TEST  11/09/2009   EF 75%   CARPAL TUNNEL RELEASE  2002   bilateral   COLONOSCOPY WITH PROPOFOL  N/A 10/23/2018   Procedure: COLONOSCOPY WITH PROPOFOL ;  Surgeon: Yvonne Medford, MD;  Location: [facility];  Service: Endoscopy;  Laterality: N/A;   ESOPHAGOGASTRODUODENOSCOPY N/A 11/16/2021   Procedure: ESOPHAGOGASTRODUODENOSCOPY (EGD);  Surgeon: Yvonne Medford, MD;  Location: [facility];  Service: Endoscopy;  Laterality: N/A;   ESOPHAGOGASTRODUODENOSCOPY (EGD) WITH PROPOFOL  N/A  10/23/2018   Procedure: ESOPHAGOGASTRODUODENOSCOPY (EGD) WITH PROPOFOL ;  Surgeon: Yvonne Medford, MD;  Location: [facility];  Service: Endoscopy;  Laterality: N/A;   EUS N/A 10/23/2018   Procedure: UPPER ENDOSCOPIC ULTRASOUND (EUS) RADIAL;  Surgeon: Yvonne Medford, MD;  Location: [facility];  Service: Endoscopy;  Laterality: N/A;   EUS N/A 11/16/2021   Procedure: UPPER ENDOSCOPIC ULTRASOUND (EUS) RADIAL;  Surgeon: Yvonne Medford, MD;  Location: [facility];  Service: Endoscopy;  Laterality: N/A;   MASS EXCISION Left 06/02/2019   Procedure: EXCISION OF SUBCUTANEOUS ABDOMINAL WALL MASSES LEFT LOWER QUADRANT;  Surgeon: Yvonne Ebbing, MD;  Location: [facility];  Service: General;  Laterality: Left;   TONSILLECTOMY  1958   TRANSTHORACIC ECHOCARDIOGRAM  11/01/2009  NORMAL LV FILLING AND DIASTOLIC DYSFUNCTION AND MILD AORTIC SCLEROSIS AND TRACE MITRAL REGURGITATION   Social History:  Social History   Socioeconomic History   Marital status: Married    Spouse name: Yvonne [name]   Number of children: 1   Years of education: COLLEGE   Highest education level: Not on file  Occupational History   Occupation: Retired  Tobacco Use   Smoking status: Former    Current packs/day: 0.00    Average packs/day: 0.5 packs/day for 10.0 years (5.0 ttl pk-yrs)    Types: Cigarettes    Start date: 08/13/1965    Quit date: 08/14/1975    Years since quitting: 48.4   Smokeless tobacco: Never  Vaping Use   Vaping status: Never Used  Substance and Sexual Activity   Alcohol use: Yes    Comment: one glass of wine a week   Drug use: No   Sexual activity: Not on file  Other Topics Concern   Not on file  Social History Narrative   Lives at home w/ her husband, Yvonne [name].   Right-handed.   Drinks no caffeine but does eat chocolate occasionally.   epworth sleepiness scale = 3 (01/10/16)   Social Drivers of Health   Financial Resource Strain: Low Risk  (07/05/2023)   Received from [facility]   Overall  Financial Resource Strain (CARDIA)    Difficulty of Paying Living Expenses: Not hard at all  Food Insecurity: No Food Insecurity (07/05/2023)   [facility] from [facility]   Hunger Vital Sign    Worried About Running Out of Food in the Last Year: Never true    Ran Out of Food in the Last Year: Never true  Transportation Needs: No Transportation Needs (07/05/2023)   Received from [facility] - Transportation    In the past 12 months, has lack of transportation kept you from medical appointments or from getting medications?: No    Lack of Transportation (Non-Medical): No  Physical Activity: Not on file  Stress: Not on file  Social Connections: Not on file  Intimate Partner Violence: Not At Risk (06/15/2022)   Humiliation, Afraid, Rape, and Kick questionnaire    Fear of Current or Ex-Partner: No    Emotionally Abused: No    Physically Abused: No    Sexually Abused: No   Family History:  Family History  Problem Relation Age of Onset   Hypertension Mother    Heart disease Mother    Dementia Mother    Heart disease Father    Arthritis Father    Diabetes Father    Hypertension Father    Pancreatic cancer Brother 71       pancreatic   Other Brother        automobile accident   Throat cancer Maternal Uncle    Brain cancer Paternal Aunt 47       malignant brain cancer   Stroke Maternal Grandmother    Heart disease Maternal Grandfather    Pancreatic cancer Cousin 37       pancreatic (maternal first cousin)   Pancreatic cancer Cousin 89       pancreatic, smoker (maternal first cousin)   Breast cancer Cousin 73       breast (maternal first cousin)   Ovarian cancer Cousin    Uterine cancer Cousin 30       uterine (paternal first cousin)   Cancer Cousin 108       unknown primary (  paternal first cousin)   Bladder Cancer Cousin 56       (paternal first cousin)   Lung cancer Cousin    Throat cancer Cousin    Prostate cancer Cousin         (maternal first cousin)   Stomach cancer Cousin 17       (maternal first cousin)   Cancer Other 55       pancreatic neuroendocrine carcinoma   Colon cancer Neg Hx    Neuropathy Neg Hx     Review of Systems: Constitutional: Doesn't report fevers, chills or abnormal weight loss Eyes: Doesn't report blurriness of vision Ears, nose, mouth, throat, and face: Doesn't report sore throat Respiratory: Doesn't report cough, dyspnea or wheezes Cardiovascular: Doesn't report palpitation, chest discomfort  Gastrointestinal:  Doesn't report nausea, constipation, diarrhea GU: Doesn't report incontinence Skin: Doesn't report skin rashes Neurological: Per HPI Musculoskeletal: Doesn't report joint pain Behavioral/Psych: Doesn't report anxiety  Physical Exam: Vitals:   12/30/23 1140  BP: (!) 126/55  Pulse: 65  Resp: 16  Temp: 97.9 F (36.6 C)  SpO2: 99%    KPS: 70. General: Alert, cooperative, pleasant, in no acute distress Head: Normal EENT: No conjunctival injection or scleral icterus.  Lungs: Resp effort normal Cardiac: Regular rate Abdomen: Non-distended abdomen Skin: No rashes cyanosis or petechiae. Extremities: No clubbing or edema  Neurologic Exam: Mental Status: Awake, alert, attentive to examiner. Oriented to self and environment. Language is fluent but comprehension is markedly impaired.  Anterograde amnesia is noted, mild neglect, elements of agnosia. Cranial Nerves: Visual acuity is grossly normal. Visual fields are full. Extra-ocular movements intact. No ptosis. Face is symmetric Motor: Tone and bulk are normal. Power is full in both arms and legs. Reflexes are symmetric, no pathologic reflexes present.  Sensory: Intact to light touch Gait: Normal.   Labs: I have reviewed the data as listed    Component Value Date/Time   NA 140 12/30/2023 1127   K 4.1 12/30/2023 1127   CL 105 12/30/2023 1127   CO2 29 12/30/2023 1127   GLUCOSE 181 (H) 12/30/2023 1127   BUN 19  12/30/2023 1127   CREATININE 0.76 12/30/2023 1127   CREATININE 0.71 01/04/2016 0739   CALCIUM  9.6 12/30/2023 1127   PROT 7.1 12/30/2023 1127   PROT 6.7 10/03/2021 0918   ALBUMIN 4.6 12/30/2023 1127   AST 13 (L) 12/30/2023 1127   ALT 15 12/30/2023 1127   ALKPHOS 49 12/30/2023 1127   BILITOT 0.5 12/30/2023 1127   GFRNONAA >60 12/30/2023 1127   GFRAA >60 10/23/2019 1004   Lab Results  Component Value Date   WBC 5.7 12/30/2023   NEUTROABS 4.3 12/30/2023   HGB 14.2 12/30/2023   HCT 41.9 12/30/2023   MCV 81.4 12/30/2023   PLT 112 (L) 12/30/2023    Imaging:  [facility] Clinician Interpretation: I have personally reviewed the CNS images as listed.  My interpretation, in the context of the patient's clinical presentation, is progressive disease  MR BRAIN W WO CONTRAST Result Date: 12/30/2023 CLINICAL DATA:  78 year old female glioblastoma IDH wild type. Increased enhancement in the residual left temporal lobe which was reportedly biopsied with pathology revealing radiation necrosis. Restaging. EXAM: MRI HEAD WITHOUT AND WITH CONTRAST TECHNIQUE: Multiplanar, multiecho pulse sequences of the brain and surrounding structures were obtained without and with intravenous contrast. CONTRAST:  6mL GADAVIST  GADOBUTROL  1 MMOL/ML IV SOLN COMPARISON:  Brain MRI 10/30/2023 and earlier. FINDINGS: Brain: Area of irregular stellate, nodular and petechial enhancement is larger since  10/30/2023, in the posterior left temporal lobe and now tracking into the left hippocampal and parahippocampal region also (series 16, image 76). Dominant area of enhancement now encompasses 37 by thirty-two by 24 mm (AP by transverse by CC) versus about 24 by 23 by 20 mm in March. DWI in that area appears fairly bland, with only a punctate area of periventricular increased diffusion signal series 13, image 14. This corresponds to the superior margin of the abnormal enhancement now. Additionally, hemosiderin in the area has increased over  that time (series 10, image 26 now). Regional T2 and FLAIR hyperintensity continues to increase, now tracking confluently throughout the left occipital lobe into the occipital pole (compareseries 9, image 21 today versus series 9, image 21 in March). Mild mass effect on the left lateral ventricle atrium and occipital horn now. And edema tracking to the tail of the hippocampus is new on series 9, image 23. Anterior temporal resection cavity size is stable. No remote or new area of abnormal intracranial enhancement. No superimposed restricted diffusion to suggest acute infarction. No midline shift, ventriculomegaly, or acute intracranial hemorrhage. Cervicomedullary junction and pituitary are within normal limits. No dural thickening identified. Vascular: Major intracranial vascular flow voids are stable. Following contrast the major dural venous sinuses are enhancing and appear to be patent. Skull and upper cervical spine: Previous left frontotemporal craniotomy. Visualized bone marrow signal is within normal limits. Negative visible cervical spine. Sinuses/Orbits: Stable, negative. Other: Stable scalp soft tissues. IMPRESSION: Further significant progression of stellate and irregular enhancement, regional T2/FLAIR hyperintensity emanating from the posterior left temporal lobe. Mild regional mass effect now, hippocampal formation involvement now. Also, hemosiderin has increased in the area. Constellation is consistent with either progressive radiation necrosis or infiltrating tumor. Electronically Signed   By: Yvonne [name] M.D.   On: 12/30/2023 13:57    Assessment/Plan Glioblastoma, IDH-wildtype (HCC)  Yvonne [name] is clinically stable today, now having completed cycle #2 resumed 5-day Temodar .  Labs demonstrates ongoing modest thrombocytopenia.    MRI demonstrates progression of disease, favored organic progression over radiation necrosis given pattern of sustained growth over 5 months.  This is leading to  some language and cognitive symptoms.  We recommended discontinuing continuing treatment with Temozolomide .  We discussed options for second line therapy, including CCNU/avastin and clinical trial referral.  She will reach out to [facility] regarding any study eligibility.   Pending a clinical trial opportunity, patient elected to proceed with oral CCNU 90mg /m2 q6 weeks and avastin 10mg /kg IV 2q weeks.  Avastin will be helpful as concurrent therapy given burden of enhancing tumor and steroid requirement.  We reviewed side effects of CCNU, including fatigue, nausea vomiting, cytopenias, ILD.  We reviewed side effects of avastin, including hypertension, bleeding/clotting events, wound healing impairment.  The patient will have a complete blood count, a comprehensive metabolic panel, and urine protein performed prior to each avastin infusion. Labs may need to be performed more often. Zofran  will prescribed for home use for nausea/vomiting.    Informed consent was obtained verbally at bedside to proceed with oral chemotherapy and avastin.  Chemotherapy should be held for the following:  ANC less than 1,000  Platelets less than 100,000  LFT or creatinine greater than 2x ULN  If clinical concerns/contraindications develop   Avastin should be held for the following:  ANC less than 500  Platelets less than 50,000  LFT or creatinine greater than 2x ULN  If clinical concerns/contraindications develop  Agreeable with trial of decadron  4mg  daily  for dysphasia, if tolerated.  We ask that MADILYNNE MULLAN return to clinic once goals of care and treatment plan are established, or sooner as needed.  All questions were answered. The patient knows to call the clinic with any problems, questions or concerns. No barriers to learning were detected.  The total time spent in the encounter was 40 minutes and more than 50% was on counseling and review of test results   Mamie Searles, MD Medical Director of  Neuro-Oncology [facility] 12/30/23 2:56 PM

## 2024-01-01 ENCOUNTER — Other Ambulatory Visit (HOSPITAL_COMMUNITY): Payer: Self-pay

## 2024-01-01 ENCOUNTER — Encounter: Payer: Self-pay | Admitting: *Deleted

## 2024-01-01 DIAGNOSIS — C712 Malignant neoplasm of temporal lobe: Secondary | ICD-10-CM | POA: Diagnosis not present

## 2024-01-01 NOTE — Progress Notes (Addendum)
 Requested that [employer] send images to [facility] through [place], MRI brain done here on 10/30/23.  Per daughter's request.

## 2024-01-01 NOTE — Progress Notes (Signed)
 Per chart,   "We recommended discontinuing continuing treatment with Temozolomide .  We discussed options for second line therapy, including CCNU/avastin and clinical trial referral.  She will reach out to [facility] regarding any study eligibility."   Disenrolling patient from [facility].

## 2024-01-02 ENCOUNTER — Inpatient Hospital Stay (HOSPITAL_BASED_OUTPATIENT_CLINIC_OR_DEPARTMENT_OTHER): Admitting: Internal Medicine

## 2024-01-02 DIAGNOSIS — C719 Malignant neoplasm of brain, unspecified: Secondary | ICD-10-CM | POA: Diagnosis not present

## 2024-01-02 NOTE — Progress Notes (Signed)
 I connected with Yvonne [name] on 01/02/24 at 10:15 AM EDT by telephone visit and verified that I am speaking with the correct person using two identifiers.  I discussed the limitations, risks, security and privacy concerns of performing an evaluation and management service by telemedicine and the availability of in-person appointments. I also discussed with the patient that there may be a patient responsible charge related to this service. The patient expressed understanding and agreed to proceed.   Other persons participating in the visit and their role in the encounter:  daughter  Patient's location:  Home Provider's location:  Office Chief Complaint:  Glioblastoma, IDH-wildtype (HCC)  History of Present Ilness: Yvonne [name] reports no clinical changes today.  She recently had visit at [facility] to discuss possible clinical trials.  Denies seizures, headaches.   Observations: Language and cognition at baseline  Assessment and Plan: Glioblastoma, IDH-wildtype (HCC)  Clinically stable today.  We provided support for proceeding with clinical trial, MOAB study, at [facility].  They are leaning towards enrolling in the study but are also giving consideration to second line chemotherapy here.  Follow Up Instructions: RTC TBD pending study enrollment  I discussed the assessment and treatment plan with the patient.  The patient was provided an opportunity to ask questions and all were answered.  The patient agreed with the plan and demonstrated understanding of the instructions.    The patient was advised to call back or seek an in-person evaluation if the symptoms worsen or if the condition fails to improve as anticipated.    Yudith Norlander K Durante Violett, MD   I provided 20 minutes of non face-to-face telephone visit time during this encounter, and > 50% was spent counseling as documented under my assessment & plan.

## 2024-01-09 ENCOUNTER — Telehealth: Payer: Self-pay | Admitting: *Deleted

## 2024-01-09 NOTE — Telephone Encounter (Signed)
 PC received from patient's daughter, Genevia Kern - she states the patient has decided to proceed with trial at [facility], she is start on June 16.  Dr Mark Sil informed.

## 2024-01-31 DIAGNOSIS — E1140 Type 2 diabetes mellitus with diabetic neuropathy, unspecified: Secondary | ICD-10-CM | POA: Diagnosis not present

## 2024-01-31 DIAGNOSIS — Z7984 Long term (current) use of oral hypoglycemic drugs: Secondary | ICD-10-CM | POA: Diagnosis not present

## 2024-01-31 DIAGNOSIS — E039 Hypothyroidism, unspecified: Secondary | ICD-10-CM | POA: Diagnosis not present

## 2024-01-31 DIAGNOSIS — E1165 Type 2 diabetes mellitus with hyperglycemia: Secondary | ICD-10-CM | POA: Diagnosis not present

## 2024-01-31 DIAGNOSIS — E785 Hyperlipidemia, unspecified: Secondary | ICD-10-CM | POA: Diagnosis not present

## 2024-02-03 DIAGNOSIS — H0100A Unspecified blepharitis right eye, upper and lower eyelids: Secondary | ICD-10-CM | POA: Diagnosis not present

## 2024-02-03 DIAGNOSIS — H6123 Impacted cerumen, bilateral: Secondary | ICD-10-CM | POA: Diagnosis not present

## 2024-02-03 DIAGNOSIS — E785 Hyperlipidemia, unspecified: Secondary | ICD-10-CM | POA: Diagnosis not present

## 2024-02-03 DIAGNOSIS — C719 Malignant neoplasm of brain, unspecified: Secondary | ICD-10-CM | POA: Diagnosis not present

## 2024-02-03 DIAGNOSIS — E039 Hypothyroidism, unspecified: Secondary | ICD-10-CM | POA: Diagnosis not present

## 2024-02-03 DIAGNOSIS — I10 Essential (primary) hypertension: Secondary | ICD-10-CM | POA: Diagnosis not present

## 2024-02-03 DIAGNOSIS — Z6824 Body mass index (BMI) 24.0-24.9, adult: Secondary | ICD-10-CM | POA: Diagnosis not present

## 2024-02-03 DIAGNOSIS — E1140 Type 2 diabetes mellitus with diabetic neuropathy, unspecified: Secondary | ICD-10-CM | POA: Diagnosis not present

## 2024-02-18 DIAGNOSIS — E559 Vitamin D deficiency, unspecified: Secondary | ICD-10-CM | POA: Diagnosis not present

## 2024-02-18 DIAGNOSIS — I10 Essential (primary) hypertension: Secondary | ICD-10-CM | POA: Diagnosis not present

## 2024-02-18 DIAGNOSIS — E039 Hypothyroidism, unspecified: Secondary | ICD-10-CM | POA: Diagnosis not present

## 2024-02-18 DIAGNOSIS — Z87891 Personal history of nicotine dependence: Secondary | ICD-10-CM | POA: Diagnosis not present

## 2024-02-18 DIAGNOSIS — E785 Hyperlipidemia, unspecified: Secondary | ICD-10-CM | POA: Diagnosis not present

## 2024-02-18 DIAGNOSIS — E119 Type 2 diabetes mellitus without complications: Secondary | ICD-10-CM | POA: Diagnosis not present

## 2024-02-18 DIAGNOSIS — G4733 Obstructive sleep apnea (adult) (pediatric): Secondary | ICD-10-CM | POA: Diagnosis not present

## 2024-02-18 DIAGNOSIS — C712 Malignant neoplasm of temporal lobe: Secondary | ICD-10-CM | POA: Diagnosis not present

## 2024-02-18 DIAGNOSIS — Z882 Allergy status to sulfonamides status: Secondary | ICD-10-CM | POA: Diagnosis not present

## 2024-02-18 DIAGNOSIS — Z01818 Encounter for other preprocedural examination: Secondary | ICD-10-CM | POA: Diagnosis not present

## 2024-02-18 DIAGNOSIS — Z9889 Other specified postprocedural states: Secondary | ICD-10-CM | POA: Diagnosis not present

## 2024-02-18 DIAGNOSIS — C719 Malignant neoplasm of brain, unspecified: Secondary | ICD-10-CM | POA: Diagnosis not present

## 2024-02-19 ENCOUNTER — Inpatient Hospital Stay
Admission: RE | Admit: 2024-02-19 | Discharge: 2024-02-19 | Disposition: A | Discharge status: Home / Self Care | Payer: Self-pay | Source: Ambulatory Visit | Attending: Internal Medicine

## 2024-02-19 ENCOUNTER — Other Ambulatory Visit: Payer: Self-pay | Admitting: *Deleted

## 2024-02-19 ENCOUNTER — Encounter: Payer: Self-pay | Admitting: *Deleted

## 2024-02-19 DIAGNOSIS — C719 Malignant neoplasm of brain, unspecified: Secondary | ICD-10-CM

## 2024-02-19 NOTE — Progress Notes (Signed)
 Requested that [facility] share images from MRI brain done on 02/18/24 with [facility].  Email to [employer] to link images to outside films order.

## 2024-02-20 ENCOUNTER — Telehealth: Payer: Self-pay | Admitting: *Deleted

## 2024-02-20 NOTE — Telephone Encounter (Signed)
 Left voicemail for patient's daughter, Reena - she requested phone visit with Dr Buckley to discuss results from recent MRI brain at [facility].  Informed Reena we have the images & phone visit is scheduled on 02/24/24 at 3:15.  Maryann Reena to contact our scheduling department if this appointment needs to be changed.

## 2024-02-24 ENCOUNTER — Inpatient Hospital Stay: Attending: Internal Medicine | Admitting: Internal Medicine

## 2024-02-24 DIAGNOSIS — R4182 Altered mental status, unspecified: Secondary | ICD-10-CM | POA: Insufficient documentation

## 2024-02-24 DIAGNOSIS — C712 Malignant neoplasm of temporal lobe: Secondary | ICD-10-CM | POA: Diagnosis not present

## 2024-02-24 NOTE — Progress Notes (Signed)
 I connected with Yvonne [name] on 02/24/24 at  3:15 PM EDT by telephone visit and verified that I am speaking with the correct person using two identifiers.  I discussed the limitations, risks, security and privacy concerns of performing an evaluation and management service by telemedicine and the availability of in-person appointments. I also discussed with the patient that there may be a patient responsible charge related to this service. The patient expressed understanding and agreed to proceed.   Other persons participating in the visit and their role in the encounter:  daughter  Patient's location:  Home Provider's location:  Office Chief Complaint:  Glioblastoma of temporal lobe [facility])  History of Present Ilness: Yvonne [name] and her daughter return today following her biopsy at [facility].  She underwent once atezolizumab infusion prior to the surgery.  Next is scheduled for later next week.  Does describe increased confusion, word salad, difficulty reading.  Was started on decadron  2mg  daily which has helped somewhat.  Denies seizures, headaches.   Observations: Language and cognition at baseline  Assessment and Plan: Glioblastoma of temporal lobe (HCC)  Clinically stable today.  We provided support for proceeding with clinical trial, MOAB study, at [facility].  She has now completed the biopsy portion.  MRI brain demonstrates inflammatory response, team there will consider continuing steroids or initiating avastin once she is healed from burr hole and beyond post-surgical bleed risk.  Follow Up Instructions: RTC as needed for now  I discussed the assessment and treatment plan with the patient.  The patient was provided an opportunity to ask questions and all were answered.  The patient agreed with the plan and demonstrated understanding of the instructions.    The patient was advised to call back or seek an in-person evaluation if the symptoms worsen or if the condition fails to improve as  anticipated.    Livana Yerian K Kael Keetch, MD   I provided 20 minutes of non face-to-face telephone visit time during this encounter, and > 50% was spent counseling as documented under my assessment & plan.

## 2024-03-05 DIAGNOSIS — E1165 Type 2 diabetes mellitus with hyperglycemia: Secondary | ICD-10-CM | POA: Diagnosis not present

## 2024-03-05 DIAGNOSIS — C712 Malignant neoplasm of temporal lobe: Secondary | ICD-10-CM | POA: Diagnosis not present

## 2024-03-05 DIAGNOSIS — Z5112 Encounter for antineoplastic immunotherapy: Secondary | ICD-10-CM | POA: Diagnosis not present

## 2024-03-05 DIAGNOSIS — Z87891 Personal history of nicotine dependence: Secondary | ICD-10-CM | POA: Diagnosis not present

## 2024-03-05 DIAGNOSIS — Z006 Encounter for examination for normal comparison and control in clinical research program: Secondary | ICD-10-CM | POA: Diagnosis not present

## 2024-03-05 DIAGNOSIS — E039 Hypothyroidism, unspecified: Secondary | ICD-10-CM | POA: Diagnosis not present

## 2024-03-06 ENCOUNTER — Other Ambulatory Visit: Payer: Self-pay

## 2024-03-06 ENCOUNTER — Emergency Department (HOSPITAL_COMMUNITY)
Admission: EM | Admit: 2024-03-06 | Discharge: 2024-03-06 | Disposition: A | Discharge status: Other Acute Inpatient Hospital | Attending: Emergency Medicine | Admitting: Emergency Medicine

## 2024-03-06 ENCOUNTER — Emergency Department (HOSPITAL_COMMUNITY)

## 2024-03-06 ENCOUNTER — Encounter (HOSPITAL_COMMUNITY): Payer: Self-pay | Admitting: *Deleted

## 2024-03-06 DIAGNOSIS — E785 Hyperlipidemia, unspecified: Secondary | ICD-10-CM | POA: Diagnosis not present

## 2024-03-06 DIAGNOSIS — I341 Nonrheumatic mitral (valve) prolapse: Secondary | ICD-10-CM | POA: Diagnosis not present

## 2024-03-06 DIAGNOSIS — E039 Hypothyroidism, unspecified: Secondary | ICD-10-CM | POA: Diagnosis not present

## 2024-03-06 DIAGNOSIS — I10 Essential (primary) hypertension: Secondary | ICD-10-CM | POA: Insufficient documentation

## 2024-03-06 DIAGNOSIS — R413 Other amnesia: Secondary | ICD-10-CM | POA: Diagnosis not present

## 2024-03-06 DIAGNOSIS — F419 Anxiety disorder, unspecified: Secondary | ICD-10-CM | POA: Diagnosis not present

## 2024-03-06 DIAGNOSIS — G4733 Obstructive sleep apnea (adult) (pediatric): Secondary | ICD-10-CM | POA: Diagnosis not present

## 2024-03-06 DIAGNOSIS — R791 Abnormal coagulation profile: Secondary | ICD-10-CM | POA: Insufficient documentation

## 2024-03-06 DIAGNOSIS — Z85820 Personal history of malignant melanoma of skin: Secondary | ICD-10-CM | POA: Diagnosis not present

## 2024-03-06 DIAGNOSIS — Z79899 Other long term (current) drug therapy: Secondary | ICD-10-CM | POA: Insufficient documentation

## 2024-03-06 DIAGNOSIS — R4182 Altered mental status, unspecified: Secondary | ICD-10-CM | POA: Insufficient documentation

## 2024-03-06 DIAGNOSIS — R29818 Other symptoms and signs involving the nervous system: Secondary | ICD-10-CM | POA: Diagnosis not present

## 2024-03-06 DIAGNOSIS — E119 Type 2 diabetes mellitus without complications: Secondary | ICD-10-CM | POA: Insufficient documentation

## 2024-03-06 DIAGNOSIS — G9389 Other specified disorders of brain: Secondary | ICD-10-CM | POA: Diagnosis not present

## 2024-03-06 DIAGNOSIS — C712 Malignant neoplasm of temporal lobe: Secondary | ICD-10-CM | POA: Diagnosis not present

## 2024-03-06 DIAGNOSIS — C719 Malignant neoplasm of brain, unspecified: Secondary | ICD-10-CM | POA: Diagnosis not present

## 2024-03-06 DIAGNOSIS — Z7984 Long term (current) use of oral hypoglycemic drugs: Secondary | ICD-10-CM | POA: Insufficient documentation

## 2024-03-06 DIAGNOSIS — R404 Transient alteration of awareness: Secondary | ICD-10-CM | POA: Diagnosis not present

## 2024-03-06 DIAGNOSIS — G939 Disorder of brain, unspecified: Secondary | ICD-10-CM | POA: Diagnosis not present

## 2024-03-06 LAB — COMPREHENSIVE METABOLIC PANEL WITH GFR
ALT: 18 U/L (ref 0–44)
AST: 17 U/L (ref 15–41)
Albumin: 3.9 g/dL (ref 3.5–5.0)
Alkaline Phosphatase: 53 U/L (ref 38–126)
Anion gap: 14 (ref 5–15)
BUN: 19 mg/dL (ref 8–23)
CO2: 21 mmol/L — ABNORMAL LOW (ref 22–32)
Calcium: 9.3 mg/dL (ref 8.9–10.3)
Chloride: 106 mmol/L (ref 98–111)
Creatinine, Ser: 0.62 mg/dL (ref 0.44–1.00)
GFR, Estimated: >60 mL/min (ref >60)
Glucose, Bld: 123 mg/dL — ABNORMAL HIGH (ref 70–99)
Potassium: 3.9 mmol/L (ref 3.5–5.1)
Sodium: 141 mmol/L (ref 135–145)
Total Bilirubin: 0.4 mg/dL (ref 0.0–1.2)
Total Protein: 6.6 g/dL (ref 6.5–8.1)

## 2024-03-06 LAB — CBC WITH DIFFERENTIAL/PLATELET
Abs Immature Granulocytes: 0.09 K/uL — ABNORMAL HIGH (ref 0.00–0.07)
Basophils Absolute: 0.1 K/uL (ref 0.0–0.1)
Basophils Relative: 1 %
Eosinophils Absolute: 0.3 K/uL (ref 0.0–0.5)
Eosinophils Relative: 4 %
HCT: 42.5 % (ref 36.0–46.0)
Hemoglobin: 13.3 g/dL (ref 12.0–15.0)
Immature Granulocytes: 1 %
Lymphocytes Relative: 14 %
Lymphs Abs: 1.2 K/uL (ref 0.7–4.0)
MCH: 26.7 pg (ref 26.0–34.0)
MCHC: 31.3 g/dL (ref 30.0–36.0)
MCV: 85.3 fL (ref 80.0–100.0)
Monocytes Absolute: 0.9 K/uL (ref 0.1–1.0)
Monocytes Relative: 10 %
Neutro Abs: 6.3 K/uL (ref 1.7–7.7)
Neutrophils Relative %: 70 %
Platelets: 157 K/uL (ref 150–400)
RBC: 4.98 MIL/uL (ref 3.87–5.11)
RDW: 15.1 % (ref 11.5–15.5)
WBC: 8.8 K/uL (ref 4.0–10.5)
nRBC: 0 % (ref 0.0–0.2)

## 2024-03-06 LAB — APTT: aPTT: 30 s (ref 24–36)

## 2024-03-06 LAB — AMMONIA: Ammonia: <13 umol/L (ref 9–35)

## 2024-03-06 LAB — PROTIME-INR
INR: 0.9 (ref 0.8–1.2)
Prothrombin Time: 13 s (ref 11.4–15.2)

## 2024-03-06 LAB — PHOSPHORUS: Phosphorus: 3.5 mg/dL (ref 2.5–4.6)

## 2024-03-06 LAB — MAGNESIUM: Magnesium: 2.1 mg/dL (ref 1.7–2.4)

## 2024-03-06 LAB — CK: Total CK: 26 U/L — ABNORMAL LOW (ref 38–234)

## 2024-03-06 MED ORDER — GADOBUTROL 1 MMOL/ML IV SOLN
7.0000 mL | Freq: Once | INTRAVENOUS | Status: AC | PRN
  Administered 2024-03-06: 7 mL via INTRAVENOUS

## 2024-03-06 NOTE — ED Notes (Signed)
 NT placed seizure pads on bed for prevention.

## 2024-03-06 NOTE — ED Provider Notes (Signed)
 [place] EMERGENCY DEPARTMENT AT [facility] Provider Note   CSN: [id number] Arrival date & time: 03/06/24  0930     Patient presents with: Altered Mental Status   Yvonne [name] is a 78 y.o. female.   The history is provided by the patient and medical records. No language interpreter was used.  Altered Mental Status Presenting symptoms: memory loss   Presenting symptoms: no confusion   Severity:  Severe Most recent episode:  Today Episode history:  Continuous Timing:  Constant Progression:  Unchanged Chronicity:  New Associated symptoms: no abdominal pain, no agitation, no difficulty breathing, no fever, no headaches, no light-headedness, no nausea, no palpitations, no rash, no slurred speech, no visual change, no vomiting and no weakness        Prior to Admission medications   Medication Sig Start Date End Date Taking? Authorizing Provider  acetaminophen  (TYLENOL ) 500 MG tablet Take 1,000 mg by mouth every 8 (eight) hours as needed for moderate pain.    [provider]  Cholecalciferol  (VITAMIN D ) 50 MCG (2000 UT) tablet Take 2,000 Units by mouth daily.     [provider]  dexamethasone  (DECADRON ) 4 MG tablet Take 1 tablet (4 mg total) by mouth daily. 12/30/23   Vaslow, Zachary K, MD  fluticasone  (FLONASE ) 50 MCG/ACT nasal spray Place 2 sprays into both nostrils at bedtime.    [provider]  furosemide  (LASIX ) 20 MG tablet Take 10 mg by mouth as needed for edema.    [provider]  gabapentin  (NEURONTIN ) 300 MG capsule Take 300 mg by mouth at bedtime.     Cleotilde Planas, MD  JARDIANCE 10 MG TABS tablet 1 tablet Orally Once a day 10/05/22   [provider]  levothyroxine  (SYNTHROID ) 88 MCG tablet Take 88 mcg by mouth daily before breakfast.    [provider]  metFORMIN (GLUCOPHAGE) 500 MG tablet Take 500 mg by mouth 2 (two) times daily. 09/05/22   [provider]  metoprolol  succinate (TOPROL -XL)  100 MG 24 hr tablet Take 100 mg by mouth daily. 08/21/22   [provider]  metroNIDAZOLE  (METROCREAM ) 0.75 % cream Apply 1 application  topically daily as needed. 12/21/14   [provider]  ondansetron  (ZOFRAN ) 8 MG tablet Take 1 tablet (8 mg total) by mouth every 8 (eight) hours as needed for nausea or vomiting. 10/31/23   Vaslow, Zachary K, MD  prochlorperazine  (COMPAZINE ) 10 MG tablet Take 1 tablet (10 mg total) by mouth every 6 (six) hours as needed for nausea or vomiting. 07/26/22   Vaslow, Zachary K, MD  rosuvastatin  (CRESTOR ) 5 MG tablet Take 5 mg by mouth daily.    [provider]    Allergies: Tobramycin, Sulfa antibiotics, and Erythromycin    Review of Systems  Constitutional:  Negative for chills, diaphoresis, fatigue and fever.  HENT:  Negative for congestion, ear pain and sore throat.   Eyes:  Negative for pain and visual disturbance.  Respiratory:  Negative for cough, chest tightness and shortness of breath.   Cardiovascular:  Negative for chest pain and palpitations.  Gastrointestinal:  Negative for abdominal pain, constipation, diarrhea, nausea and vomiting.  Genitourinary:  Negative for dysuria and hematuria.  Musculoskeletal:  Negative for arthralgias, back pain and neck pain.  Skin:  Negative for color change and rash.  Neurological:  Negative for dizziness, syncope, facial asymmetry, speech difficulty, weakness, light-headedness and headaches.  Psychiatric/Behavioral:  Positive for memory loss. Negative for agitation and confusion.  All other systems reviewed and are negative.   Updated Vital Signs BP (!) 195/79 (BP Location: Right Arm)   Pulse (!) 55   Temp 97.6 F (36.4 C) (Oral)   Resp 16   SpO2 100%   Physical Exam Vitals and nursing note reviewed.  Constitutional:      General: She is not in acute distress.    Appearance: She is well-developed. She is not ill-appearing, toxic-appearing or diaphoretic.  HENT:     Head:  Normocephalic and atraumatic.     Nose: Nose normal.     Mouth/Throat:     Mouth: Mucous membranes are moist.     Pharynx: No oropharyngeal exudate or posterior oropharyngeal erythema.  Eyes:     Conjunctiva/sclera: Conjunctivae normal.  Neck:     Vascular: No carotid bruit.  Cardiovascular:     Rate and Rhythm: Normal rate and regular rhythm.     Heart sounds: No murmur heard. Pulmonary:     Effort: Pulmonary effort is normal. No respiratory distress.     Breath sounds: Normal breath sounds. No wheezing, rhonchi or rales.  Chest:     Chest wall: No tenderness.  Abdominal:     Palpations: Abdomen is soft.     Tenderness: There is no abdominal tenderness. There is no guarding or rebound.  Musculoskeletal:        General: No swelling or tenderness.     Cervical back: Neck supple. No tenderness.  Skin:    General: Skin is warm and dry.     Capillary Refill: Capillary refill takes less than 2 seconds.     Findings: No erythema or rash.  Neurological:     Mental Status: She is alert.     GCS: GCS eye subscore is 4. GCS verbal subscore is 5. GCS motor subscore is 6.     Cranial Nerves: No dysarthria or facial asymmetry.     Sensory: No sensory deficit.     Motor: No tremor or abnormal muscle tone.     Coordination: Finger-Nose-Finger Test normal.     Comments: Patient not making memories and does not remember interactions just minutes before.     (all labs ordered are listed, but only abnormal results are displayed) Labs Reviewed  COMPREHENSIVE METABOLIC PANEL WITH GFR - Abnormal; Notable for the following components:      Result Value   CO2 21 (*)    Glucose, Bld 123 (*)    All other components within normal limits  CK - Abnormal; Notable for the following components:   Total CK 26 (*)    All other components within normal limits  CBC WITH DIFFERENTIAL/PLATELET - Abnormal; Notable for the following components:   Abs Immature Granulocytes 0.09 (*)    All other components  within normal limits  AMMONIA  MAGNESIUM  PHOSPHORUS  APTT  PROTIME-INR  CBC WITH DIFFERENTIAL/PLATELET  URINALYSIS, W/ REFLEX TO CULTURE (INFECTION SUSPECTED)  TSH  LEVETIRACETAM  LEVEL    EKG: EKG Interpretation Date/Time:  Friday March 06 2024 10:15:31 EDT Ventricular Rate:  58 PR Interval:  169 QRS Duration:  96 QT Interval:  428 QTC Calculation: 421 R Axis:   33  Text Interpretation: Sinus rhythm Inferior infarct, old Anterior infarct, old when compared to prior, slower rate No STEMI Confirmed by Ginger Barefoot ([id number]) on 03/06/2024 10:43:48 AM  Radiology: MR Brain W and Wo Contrast Result Date: 03/06/2024 CLINICAL DATA:  Provided history: Memory loss. Focal memory loss like transient global amnesia. Concern for focal  seizures related to recent brain biopsy and known glioblastoma. EXAM: MRI HEAD WITHOUT AND WITH CONTRAST TECHNIQUE: Multiplanar, multiecho pulse sequences of the brain and surrounding structures were obtained without and with intravenous contrast. CONTRAST:  7mL GADAVIST  GADOBUTROL  1 MMOL/ML IV SOLN COMPARISON:  Brain MRI 12/30/2023. FINDINGS: Brain: Postoperative changes from prior left pterional craniotomy with redemonstrated underlying anterior left temporal resection cavity. Adjacent chronic hemosiderin deposition within the posterior left temporal lobe, similar to the prior MRI of 12/30/2023. Focus of chronic postoperative encephalomalacia/gliosis also again demonstrated within the anterolateral left frontal lobe. Ill-defined enhancement within the posterior left temporal lobe persists, but has decreased. However, irregular foci of restricted diffusion at this site have increased in extent. 2.0 x 2.6 x 1.6 cm lesion within the left parietal lobe with central necrosis, thick peripheral enhancement and associated diffusion-weighted signal abnormality (for instance as seen on series 17, image 14) (series 18, image 23). This is new from the prior MRI. New faint focus of  parenchymal enhancement at the left frontoparietal operculum (best appreciated on series 18, image 22). Extensive T2 FLAIR hyperintense signal abnormality within the left cerebral hemisphere (temporal lobe, hippocampal/parahippocampal region, insula/frontal operculum, parietal lobe and occipital lobe) has increased. For instance, signal abnormality extends more superiorly within the left parietal lobe. As before, there is partial effacement of the left lateral ventricle with 3 mm rightward midline shift. Vascular: Maintained flow voids within the proximal large arterial vessels. Skull and upper cervical spine: No focal worrisome marrow lesion. Incompletely assessed cervical spondylosis. Facet ankylosis on the left at C4-C5. Sinuses/Orbits: No mass or acute finding within the imaged orbits. Moderate mucosal thickening within the right maxillary sinus. Other: Trace fluid within left mastoid air cells. IMPRESSION: 1. 2.0 x 2.6 cm centrally necrotic and peripherally enhancing lesion within the left parietal lobe, new from the prior MRI of 12/30/2023. New faint focus of parenchymal enhancement at the left frontoparietal operculum. These findings likely reflect tumor progression. Extensive T2 FLAIR hyperintense signal abnormality within the left cerebral hemisphere has also increased. 2. Persistent although decreased enhancement within the posterior left temporal lobe. However, foci of restricted diffusion at this site have increased in extent. These findings could reflect treatment effect (the patient is reportedly receiving Avastin therapy). However, attention is recommended on MRI follow-up. 3. Mass effect has not significantly changed. Persistent partial effacement of the left lateral ventricle and 3 mm rightward midline shift. 4. Moderate right maxillary sinus mucosal thickening. Electronically Signed   By: Rockey Childs D.O.   On: 03/06/2024 13:06   CT HEAD WO CONTRAST ( ) Result Date: 03/06/2024 CLINICAL DATA:   Neuro deficit, persistent/recurrent, CNS neoplasm suspected known L temporal glioblastoma s/p recent biopsy as part of study, rule out ICH EXAM: CT HEAD WITHOUT CONTRAST TECHNIQUE: Contiguous axial images were obtained from the base of the skull through the vertex without intravenous contrast. RADIATION DOSE REDUCTION: This exam was performed according to the departmental dose-optimization program which includes automated exposure control, adjustment of the mA and/or kV according to patient size and/or use of iterative reconstruction technique. COMPARISON:  MRI of the head dated February 18, 2024. FINDINGS: Brain: The patient is status post left pterional craniotomy for resection of lesion in left anterior temporal lobe. There is diminished attenuation present within the left temporal, occipital and parietal lobes, compatible with infiltrating residual neoplasm and vasogenic edema. There is no evidence of intracranial hemorrhage. There is left cerebral swelling with shift of midline structures to the right by approximately 5 mm. The right  cerebral hemisphere is unremarkable. Vascular: Moderate calcific atheromatous disease within the carotid siphons. Skull: Status post left pterional craniotomy. Unremarkable otherwise. Sinuses/Orbits: Mild mucosal disease within the right maxillary sinus. Normal orbits. Clear mastoid air cells. Other: None. IMPRESSION: 1. Status post left or own ule craniotomy for resection of lesion in the left anterior temporal lobe. There are findings suggestive of residual infiltrating neoplasm and edema within the left temporal, parietal and occipital lobes, similar to the prior study. There is mild shift of the midline structures to the right. Electronically Signed   By: Evalene Coho M.D.   On: 03/06/2024 11:17     Procedures   Medications Ordered in the ED  gadobutrol  (GADAVIST ) 1 MMOL/ML injection 7 mL (7 mLs Intravenous Contrast Given 03/06/24 1155)                                     Medical Decision Making Amount and/or Complexity of Data Reviewed Labs: ordered. Radiology: ordered.  Risk Prescription drug management.    Yvonne [name] is a 78 y.o. female with a past medical history significant for hypertension, hyperlipidemia, hypothyroidism, diabetes, sleep apnea, and glioblastoma status post a brain biopsy 2 weeks ago and 2 different infusion therapies yesterday who presents for neurologic complaint and memory loss.  According to family, she did well after her infusion yesterday and was last normal at about midnight walk around the house and talking on the phone.  Patient then woke up this morning and having altered mental status with repetitive questioning.  Daughter says that the patient is never had a memory problem like this and she has not remember anything today.  She does not remember what happened recently either.  Daughter says that that she has not had any other speech changes and has a baseline aphasia from her glioblastoma.  Daughter says she has not noticed any focal numbness weakness and patient denies any headache, neck pain, chest pain, shortness of breath, nausea, vomiting, constipation, diarrhea, or urinary changes.  Patient denies any congestion, cough, or other infectious symptoms.  Patient pleasantly resting but does not remember what was going on today.  On my exam, patient has focal memory loss and does not member meeting me on multiple times going into the room.  She does not remember telling me her favorite color and favorite food which are red and ice cream respectively.  She did not have any unilateral numbness weakness or difficulty with finger-nose-finger testing in extremities.  She did not have any dysarthria and has mild aphasia.  Symmetric smile.  No stridor.  No carotid bruits.  Lungs clear and chest nontender.  Abdomen nontender.  Patient otherwise resting happily.  Blood pressure is more elevated per family.  Clinically concerned about  transient global amnesia possibly caused by seizure as family does report that she had a seizure when her symptoms began that cause some of the aphasia.  Daughter says the speech is not any different otherwise.  I called and spoke with neurology who recommended getting CT head given the elevated blood pressure and the recent biopsy and then MRI brain with and without contrast.  They suspect patient will likely need admission to [facility] for seizure evaluation and EEG.  Get screening labs as well.  Anticipate reassessment and ultimately admission to [facility] for further management.  Will hold on code stroke based on symptoms and timeline.  As patient is  under care at [facility] with recent infusions and recent procedure, anticipate discussion with [facility] transfer line about management after imaging and labs are  completed here.  11:43 AM CT head did not show acute intracranial hemorrhage.  Spoke to [facility] who would like the patient ED to ED transfer to [facility] to then be admitted by the brain tumor service.  Accepting physician is Dr. [place] Komisarow.  Will fill out EMTALA documentation and make sure the images are pushed to [facility] for transfer.     Final diagnoses:  Memory loss    Clinical Impression: 1. Memory loss     Disposition: Transferred to [facility] hospital ED to ED to be admitted to their brain tumor service.  Accepting physician is Dr. [place] Komisarow.   This note was prepared with assistance of Conservation officer, historic buildings. Occasional wrong-word or sound-a-like substitutions may have occurred due to the inherent limitations of voice recognition software.       Adilson Grafton, Lonni PARAS, MD 03/06/24 1415

## 2024-03-06 NOTE — ED Triage Notes (Signed)
 Pt brought in by daughter, received IV infusions at [facility] yesterday due to Brain cancer. New infusion added yesterday. Last night normal about midnight, this morning confused.

## 2024-03-06 NOTE — Plan of Care (Addendum)
 Addendum: On my review of head CT, fairly stable since the last MRI without acute intracerebral hemorrhage  However in discussion with Dr. Buckley, given patient is in a [facility] clinical trial, do recommend reaching out to [facility]  ---------------------------------------------------------------------------------------------------------------------------------  This is a 78 year old with known history of left temporal glioblastoma getting atezolizumab, last infusion yesterday [apparently treatment plan includes oncolytic virus as well as chemotherapy; received Avastin yesterday], last known normal at midnight, confused this morning.  Also known history of seizures on Keppra  500 mg twice daily secondary to her malignancy  Last seen by Dr. Buckley 7/14 and at which time she was having increased confusion, word salad and difficulty reading which improved with starting Decadron  2 mg daily.  No history of seizures or headache at that time  MRI brain was felt to be consistent with inflammatory response and there is a plan to consider continuing steroids or initiating Avastin once healed from bur hole and beyond postsurgical bleeding risk  Conversant but very poor short-term memory does not remember for example that she just told the provider that her favorite color was red when she is asked again a few minutes later.  Otherwise preliminary examination reassuring for ED provider  Also hypertensive to systolics of 190  Recommend  Stat head CT to rule out intracerebral hemorrhage MRI brain with and without contrast Given location of lesion high risk of seizures, if imaging is unrevealing high likelihood of needing LTM EEG, which would require admission to [facility], would consider ED to ED transfer for expedited EEG hookup given bed availability at [facility] has been quite limited (would need a regular ED bed, not a hallway bed in that situation) Stat labs including CBC, CMP, magnesium,  phosphorus, CK, Keppra  level, PT/INR, PTT Okay to hold off on loading with antiepileptic at this time but would certainly give antiseizure medication if there is clear seizure activity Will make Dr. Buckley and Dr. Merrianne aware of patient  Discharge summary from 04/06/2022 also reviewed Ms. Dobbin presented to the emergency room on 04/04/2022 with sudden onset aphasia. Code stroke was activated and patient received IV thrombolytics. An EEG was also ordered which showed abundant spikes in left anterior temporal region. Therefore patient was started on Keppra  500 mg twice daily. MRI brain with and without contrast was obtained which showed 3 cm masslike signal abnormality in left temporal lobe with small foci of enhancement concerning for primary CNS neoplasm. CT chest Abdo pelvis with and without contrast did not show any other malignancy. Neurosurgery was consulted, plan for resection next week with Dr. Lanis.  H&P reviewed, apparently her presentation on 04/06/2022 was ultimately felt to be secondary to seizure, although she did receive TNK for concern for acute stroke.  Neurological deficit as documented: She has definite trouble bringing out words. She is able to name some simple objects such as my eyeglasses but had trouble naming my watch. She was able to follow some simple commands but at times that was inconsistent.  ED provider notes reports his exam today is not consistent with this kind of aphasia No, the daughter did remember that the patient had a small seizure initially that caused some of the aphasia but family says the speech is not any different and she is having no difficulty with word finding over her baseline, just is not making any memories. She did not remember meeting me when I went back to tell them the plan. She also did not remember that she told me  she liked ice cream and the color red.   These are curbside recommendations based upon the information readily available  in the chart on brief review as well as history and examination information provided to me by requesting provider and do not replace a full detailed consult

## 2024-03-06 NOTE — ED Notes (Signed)
 Call placed to [facility] hospital with report given to Jamie Johnson at this time.

## 2024-03-08 LAB — LEVETIRACETAM LEVEL: Levetiracetam Lvl: <2 ug/mL — ABNORMAL LOW (ref 10.0–40.0)

## 2024-03-09 ENCOUNTER — Telehealth: Payer: Self-pay | Admitting: *Deleted

## 2024-03-09 NOTE — Telephone Encounter (Signed)
 Returned PC to patient's daughter, Reena - she called, stated her mother  was seen in [facility] on Friday for altered mental status & transferred to [facility], she had imaging there & is home now.  Reena is requesting an appointment with Dr Buckley to discuss recent events.  Phone visit scheduled, 03/10/24 at 3:00.  [name] verbalizes understanding.

## 2024-03-10 ENCOUNTER — Inpatient Hospital Stay (HOSPITAL_BASED_OUTPATIENT_CLINIC_OR_DEPARTMENT_OTHER): Admitting: Internal Medicine

## 2024-03-10 DIAGNOSIS — C712 Malignant neoplasm of temporal lobe: Secondary | ICD-10-CM | POA: Diagnosis not present

## 2024-03-10 MED ORDER — LACOSAMIDE 50 MG PO TABS: 50.0000 mg | ORAL_TABLET | Freq: Two times a day (BID) | ORAL | 3 refills | Status: DC

## 2024-03-10 NOTE — Progress Notes (Signed)
 I connected with Manuelita CHRISTELLA Ellen on 03/10/24 at  3:00 PM EDT by telephone visit and verified that I am speaking with the correct person using two identifiers.  I discussed the limitations, risks, security and privacy concerns of performing an evaluation and management service by telemedicine and the availability of in-person appointments. I also discussed with the patient that there may be a patient responsible charge related to this service. The patient expressed understanding and agreed to proceed.   Other persons participating in the visit and their role in the encounter:  daughter  Patient's location:  Home Provider's location:  Office Chief Complaint:  Glioblastoma of temporal lobe [facility])  History of Present Ilness: KAITRIN SEYBOLD presents today to discuss recent admission for altered mental status.  Following immunotherapy and avastin infusion, she woke up on the day of 03/06/24 with altered mental status, abrupt decline in speech.  This did eventually improved back to baseline by the end of the day.  No seizure activity was witnessed by family, she did go to bed normal the night before.  Past three days she has been at prior baseline.  Observations: Language and cognition at baseline  Assessment and Plan: Glioblastoma of temporal lobe (HCC)  Clinically this is possibly a focal seizure, unwitness nocturnal, with post-ictal encephalopathy.  Discussed and recommended resuming Vimpat  50mg  BID which was tolerated well previously.  She is agreeable with this.  Follow Up Instructions: RTC as scheduled or PRN  I discussed the assessment and treatment plan with the patient.  The patient was provided an opportunity to ask questions and all were answered.  The patient agreed with the plan and demonstrated understanding of the instructions.    The patient was advised to call back or seek an in-person evaluation if the symptoms worsen or if the condition fails to improve as anticipated.    Saysha Menta  K Nil Xiong, MD   I provided 20 minutes of non face-to-face telephone visit time during this encounter, and > 50% was spent counseling as documented under my assessment & plan.

## 2024-03-23 ENCOUNTER — Emergency Department (HOSPITAL_COMMUNITY): Admission: EM | Admit: 2024-03-23 | Discharge: 2024-03-23 | Disposition: A | Discharge status: Home / Self Care

## 2024-03-23 ENCOUNTER — Emergency Department (HOSPITAL_COMMUNITY)

## 2024-03-23 ENCOUNTER — Other Ambulatory Visit: Payer: Self-pay

## 2024-03-23 ENCOUNTER — Encounter (HOSPITAL_COMMUNITY): Payer: Self-pay

## 2024-03-23 DIAGNOSIS — R220 Localized swelling, mass and lump, head: Secondary | ICD-10-CM | POA: Diagnosis not present

## 2024-03-23 DIAGNOSIS — C719 Malignant neoplasm of brain, unspecified: Secondary | ICD-10-CM | POA: Insufficient documentation

## 2024-03-23 DIAGNOSIS — S70311A Abrasion, right thigh, initial encounter: Secondary | ICD-10-CM | POA: Insufficient documentation

## 2024-03-23 DIAGNOSIS — W0110XA Fall on same level from slipping, tripping and stumbling with subsequent striking against unspecified object, initial encounter: Secondary | ICD-10-CM | POA: Diagnosis not present

## 2024-03-23 DIAGNOSIS — S00211A Abrasion of right eyelid and periocular area, initial encounter: Secondary | ICD-10-CM | POA: Insufficient documentation

## 2024-03-23 DIAGNOSIS — I10 Essential (primary) hypertension: Secondary | ICD-10-CM | POA: Diagnosis not present

## 2024-03-23 DIAGNOSIS — G936 Cerebral edema: Secondary | ICD-10-CM | POA: Diagnosis not present

## 2024-03-23 DIAGNOSIS — M4312 Spondylolisthesis, cervical region: Secondary | ICD-10-CM | POA: Diagnosis not present

## 2024-03-23 DIAGNOSIS — S199XXA Unspecified injury of neck, initial encounter: Secondary | ICD-10-CM | POA: Diagnosis not present

## 2024-03-23 DIAGNOSIS — S0990XA Unspecified injury of head, initial encounter: Secondary | ICD-10-CM | POA: Diagnosis not present

## 2024-03-23 DIAGNOSIS — W19XXXA Unspecified fall, initial encounter: Secondary | ICD-10-CM

## 2024-03-23 DIAGNOSIS — M4802 Spinal stenosis, cervical region: Secondary | ICD-10-CM | POA: Diagnosis not present

## 2024-03-23 NOTE — ED Triage Notes (Signed)
 Patient lost her balance after bending down and fell onto her head on the carpet. Abrasion to right side of her face. Currently getting treatment at [facility] for brain cancer. No blood thinners. No LOC. Has aphasia at baseline.

## 2024-03-23 NOTE — Discharge Instructions (Signed)
 Please follow-up with your primary doctor.  Return immediately for fevers, chills, sudden onset headache, vision loss facial droop, weakness on one side of your body. You may use bacitracin/Neosporin on your abrasions always use mild soap and water to keep them clean.  Return if they develop redness, foul odor, start draining pus or any new or worsening symptoms that are concerning to you

## 2024-03-23 NOTE — ED Provider Notes (Signed)
 [place] EMERGENCY DEPARTMENT AT [facility] Provider Note   CSN: [id number] Arrival date & time: 03/23/24  1057     Patient presents with: Yvonne [name] Yvonne [name] is a 78 y.o. female.   78 year old female presenting emergency department for evaluation after a fall this morning.  She was bending down attending to a bug, lost her balance fell forward and did hit her head.  No LOC.  Denies neck pain, chest pain, abdominal pain.  Family member at bedside and notes that she is at her baseline, does not take blood thinners, but does have a history of glioblastoma and is having treatment at [facility]       Prior to Admission medications   Medication Sig Start Date End Date Taking? Authorizing Provider  acetaminophen  (TYLENOL ) 500 MG tablet Take 1,000 mg by mouth every 8 (eight) hours as needed for moderate pain.    [provider]  Cholecalciferol  (VITAMIN D ) 50 MCG (2000 UT) tablet Take 2,000 Units by mouth daily.     [provider]  dexamethasone  (DECADRON ) 4 MG tablet Take 1 tablet (4 mg total) by mouth daily. 12/30/23   Vaslow, Zachary K, MD  fluticasone  (FLONASE ) 50 MCG/ACT nasal spray Place 2 sprays into both nostrils at bedtime.    [provider]  furosemide  (LASIX ) 20 MG tablet Take 10 mg by mouth as needed for edema.    [provider]  gabapentin  (NEURONTIN ) 300 MG capsule Take 300 mg by mouth at bedtime.     Cleotilde Planas, MD  JARDIANCE 10 MG TABS tablet 1 tablet Orally Once a day 10/05/22   [provider]  lacosamide  (VIMPAT ) 50 MG TABS tablet Take 1 tablet (50 mg total) by mouth 2 (two) times daily. 03/10/24   Vaslow, Zachary K, MD  levothyroxine  (SYNTHROID ) 88 MCG tablet Take 88 mcg by mouth daily before breakfast.    [provider]  metFORMIN (GLUCOPHAGE) 500 MG tablet Take 500 mg by mouth 2 (two) times daily. 09/05/22   [provider]  metoprolol  succinate (TOPROL -XL) 100 MG 24 hr tablet Take  100 mg by mouth daily. 08/21/22   [provider]  metroNIDAZOLE  (METROCREAM ) 0.75 % cream Apply 1 application  topically daily as needed. 12/21/14   [provider]  ondansetron  (ZOFRAN ) 8 MG tablet Take 1 tablet (8 mg total) by mouth every 8 (eight) hours as needed for nausea or vomiting. 10/31/23   Vaslow, Zachary K, MD  prochlorperazine  (COMPAZINE ) 10 MG tablet Take 1 tablet (10 mg total) by mouth every 6 (six) hours as needed for nausea or vomiting. 07/26/22   Vaslow, Zachary K, MD  rosuvastatin  (CRESTOR ) 5 MG tablet Take 5 mg by mouth daily.    [provider]    Allergies: Tobramycin, Sulfa antibiotics, and Erythromycin    Review of Systems  Updated Vital Signs BP (!) 170/70   Pulse 70   Temp 98 F (36.7 C)   Resp 15   Ht 5' 4 (1.626 m)   Wt 67.6 kg   SpO2 100%   BMI 25.58 kg/m   Physical Exam Vitals and nursing note reviewed.  Constitutional:      General: She is not in acute distress.    Appearance: She is not toxic-appearing.  HENT:     Head: Normocephalic.     Comments: Superficial abrasions around the right eye    Mouth/Throat:     Mouth: Mucous membranes are moist.  Eyes:  Extraocular Movements: Extraocular movements intact.     Conjunctiva/sclera: Conjunctivae normal.  Cardiovascular:     Rate and Rhythm: Normal rate and regular rhythm.  Pulmonary:     Effort: Pulmonary effort is normal.  Abdominal:     General: Abdomen is flat. There is no distension.     Tenderness: There is no abdominal tenderness. There is no guarding or rebound.  Musculoskeletal:        General: No tenderness.     Right lower leg: No edema.     Left lower leg: No edema.     Comments: Chest all stable nontender.  No midline spinal tenderness.  5 and 5 strength tricep strength, plantarflexion dorsiflexion  Skin:    General: Skin is warm.  Neurological:     Mental Status: She is alert and oriented to person, place, and time. Mental status is at baseline.   Psychiatric:        Mood and Affect: Mood normal.        Behavior: Behavior normal.     (all labs ordered are listed, but only abnormal results are displayed) Labs Reviewed - No data to display  EKG: EKG Interpretation Date/Time:  Monday March 23 2024 11:13:13 EDT Ventricular Rate:  73 PR Interval:  199 QRS Duration:  91 QT Interval:  391 QTC Calculation: 431 R Axis:   41  Text Interpretation: Sinus rhythm Inferior infarct, old Anterior infarct, old Confirmed by Neysa Clap [phone]) on 03/23/2024 12:06:32 PM  Radiology: CT Cervical Spine Wo Contrast Result Date: 03/23/2024 EXAM: CT CERVICAL SPINE WITHOUT CONTRAST 03/23/2024 11:45:00 AM TECHNIQUE: CT of the cervical spine was performed without the administration of intravenous contrast. Multiplanar reformatted images are provided for review. Automated exposure control, iterative reconstruction, and/or weight based adjustment of the mA/kV was utilized to reduce the radiation dose to as low as reasonably achievable. COMPARISON: None available. CLINICAL HISTORY: Neck trauma (Age >= 65y). Head trauma, minor (Age >= 65y); Neck trauma (Age >= 65y) FINDINGS: CERVICAL SPINE: BONES AND ALIGNMENT: No compression fracture or displaced fracture in the cervical spine. 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 Trace anterolisthesis of C6 on C7 likely related to chronic degenerative changes. No facet dislocation. Alignment otherwise maintained. DEGENERATIVE CHANGES: Facet arthrosis and vertebral hypertrophy at multiple levels. Foraminal stenosis most pronounced on the right at C3-4. There is no high-grade osseous spinal canal stenosis. SOFT TISSUES: The paraspinal soft tissues are unremarkable. No significant prevertebral edema. IMPRESSION: 1. No acute fracture or traumatic malalignment. Electronically signed by: Donnice Mania MD 03/23/2024 01:15 PM EDT RP Workstation: [id number]   CT Head Wo Contrast Result Date: 03/23/2024 EXAM: CT HEAD WITHOUT  CONTRAST 03/23/2024 11:45:00 AM TECHNIQUE: CT of the head was performed without the administration of intravenous contrast. Automated exposure control, iterative reconstruction, and/or weight based adjustment of the mA/kV was utilized to reduce the radiation dose to as low as reasonably achievable. COMPARISON: CT head and MRI head dated 03/06/2024. CLINICAL HISTORY: Head trauma, minor (Age >= 65y). Head trauma, minor (Age >= 65y); Neck trauma (Age >= 65y). FINDINGS: BRAIN AND VENTRICLES: No acute intracranial hemorrhage. There is a heterogeneous mostly hyperattenuating mass with central areas of hypoattenuation within the left parietal lobe corresponding to the centrally necrotic mass seen on prior studies. Overall there is decreased vasogenic edema within the left temporal and occipital lobes compared to prior with interval improvement in midline shift. Stable size and configuration of the ventricles. Sequelae of left pterional craniotomy and resection of lesion in the  underlying left temporal lobe. ORBITS: No acute abnormality. SINUSES: Mild mucosal thickening in the paranasal sinuses. SOFT TISSUES AND SKULL: No acute soft tissue abnormality. No skull fracture. IMPRESSION: 1. No acute intracranial hemorrhage. 2. Heterogeneous mass in the left parietal lobe, corresponding to the centrally necrotic mass seen on prior studies. 3. Decreased vasogenic edema in the left temporal and occipital lobes compared to prior study. 4. Interval improvement in midline shift. Electronically signed by: Donnice Mania MD 03/23/2024 01:07 PM EDT RP Workstation: [id number]     Procedures   Medications Ordered in the ED - No data to display  Clinical Course as of 03/23/24 1549  Mon Mar 23, 2024  1315 CT Head Wo Contrast MPRESSION: 1. No acute intracranial hemorrhage. 2. Heterogeneous mass in the left parietal lobe, corresponding to the centrally necrotic mass seen on prior studies. 3. Decreased vasogenic edema in the left  temporal and occipital lobes compared to prior study. 4. Interval improvement in midline shift.  Electronically signed by: Donnice Mania MD 03/23/2024 01:07 PM EDT RP Workstation: [id number]   [TY]    Clinical Course User Index [TY] Neysa Caron PARAS, DO                                 Medical Decision Making This is 78 year old female presenting emergency department after a fall.  Not on blood thinners per my independent chart review, but does have a history of glioblastoma, being treated at [facility].  Family member at bedside notes that she is at her baseline.  Will get CT head and C-spine.  Does have some superficial abrasions around her right thigh, but no laceration needing repair.  No other obvious injuries identified on exam and no other bony tenderness.  Amount and/or Complexity of Data Reviewed Labs:     Details: Considered labs, however reported to be a mechanical fall.  Labs unlikely to change management or disposition at this time. Radiology: ordered. Decision-making details documented in ED Course. ECG/medicine tests: ordered.  Risk Decision regarding hospitalization. Diagnosis or treatment significantly limited by social determinants of health.       Final diagnoses:  Fall, initial encounter    ED Discharge Orders     None          Neysa Caron PARAS, DO 03/23/24 1549

## 2024-03-23 NOTE — ED Notes (Signed)
 Patient transported to CT

## 2024-03-26 DIAGNOSIS — E1165 Type 2 diabetes mellitus with hyperglycemia: Secondary | ICD-10-CM | POA: Diagnosis not present

## 2024-03-26 DIAGNOSIS — C712 Malignant neoplasm of temporal lobe: Secondary | ICD-10-CM | POA: Diagnosis not present

## 2024-03-26 DIAGNOSIS — Z5112 Encounter for antineoplastic immunotherapy: Secondary | ICD-10-CM | POA: Diagnosis not present

## 2024-03-26 DIAGNOSIS — Z79899 Other long term (current) drug therapy: Secondary | ICD-10-CM | POA: Diagnosis not present

## 2024-03-26 DIAGNOSIS — Z79890 Hormone replacement therapy (postmenopausal): Secondary | ICD-10-CM | POA: Diagnosis not present

## 2024-03-26 DIAGNOSIS — Z87891 Personal history of nicotine dependence: Secondary | ICD-10-CM | POA: Diagnosis not present

## 2024-03-26 DIAGNOSIS — Z006 Encounter for examination for normal comparison and control in clinical research program: Secondary | ICD-10-CM | POA: Diagnosis not present

## 2024-04-22 ENCOUNTER — Encounter: Payer: Self-pay | Admitting: *Deleted

## 2024-04-22 ENCOUNTER — Other Ambulatory Visit: Payer: Self-pay | Admitting: *Deleted

## 2024-04-22 ENCOUNTER — Inpatient Hospital Stay
Admission: RE | Admit: 2024-04-22 | Discharge: 2024-04-22 | Disposition: A | Discharge status: Home / Self Care | Payer: Self-pay | Source: Ambulatory Visit | Attending: Internal Medicine

## 2024-04-22 DIAGNOSIS — Z006 Encounter for examination for normal comparison and control in clinical research program: Secondary | ICD-10-CM | POA: Diagnosis not present

## 2024-04-22 DIAGNOSIS — R569 Unspecified convulsions: Secondary | ICD-10-CM | POA: Diagnosis not present

## 2024-04-22 DIAGNOSIS — C719 Malignant neoplasm of brain, unspecified: Secondary | ICD-10-CM

## 2024-04-22 DIAGNOSIS — Z5112 Encounter for antineoplastic immunotherapy: Secondary | ICD-10-CM | POA: Diagnosis not present

## 2024-04-22 DIAGNOSIS — C712 Malignant neoplasm of temporal lobe: Secondary | ICD-10-CM | POA: Diagnosis not present

## 2024-04-22 DIAGNOSIS — R5383 Other fatigue: Secondary | ICD-10-CM | POA: Diagnosis not present

## 2024-04-22 DIAGNOSIS — Z9189 Other specified personal risk factors, not elsewhere classified: Secondary | ICD-10-CM | POA: Diagnosis not present

## 2024-04-22 DIAGNOSIS — Z87891 Personal history of nicotine dependence: Secondary | ICD-10-CM | POA: Diagnosis not present

## 2024-04-22 DIAGNOSIS — R4701 Aphasia: Secondary | ICD-10-CM | POA: Diagnosis not present

## 2024-04-22 DIAGNOSIS — Z79899 Other long term (current) drug therapy: Secondary | ICD-10-CM | POA: Diagnosis not present

## 2024-04-22 NOTE — Progress Notes (Addendum)
[facility]       tel [phone]  fax [phone] website: [email or web address]  Interval Evaluation  Yvonne [name] MRN: [id number] Visit Date: 04/22/2024   DOB: 03/28/1946   Age:  78 y.o. APP: REGGY NORMAN BOWMAN, NP Attending Physician:  TOYA EHRLICH, MD             Identifying Statement: Yvonne [name]  is a 78 y.o. female from [place] [address] with a  left temporal glioblastoma MGMT methylated, IDH-wildtype, CNS WHO grade 4 who presents for follow up.  Trial: [id number]: A Multicenter Trial to Identify Optimal Atezolizumab Biomarkers in the Setting of Recurrent Glioblastoma. The MOAB Trial    Treatment to be received today: Avastin 7.5mg /kg IV and atezolizumab   MRI for comparison this visit: 02/18/2024 MRI for comparison next visit: 04/22/2024  ASSESSMENT & PLAN   1. Glioblastoma of temporal lobe (CMS/HHS-HCC) -     Urinalysis Chemical with Microscopic Review; Future -     Urinalysis Chemical with Microscopic Review -     MRI brain with and without contrast; Future -     Patient is cleared by Provider to receive chemotherapy.; Standing  2. Research exam -     Patient is cleared by Provider to receive chemotherapy.; Standing   Assessment & Plan Glioblastoma of the temporal lobe Patient is radiographically progressive. Comfort care discussed, however patient would like to continue with treatment. Consulted MOAB guidelines and patient able to stay on trial with two progressive scans. Plan to stay on trial and return in 4 weeks for MRI and office visit. -PFTs ordered, plan to schedule if patient transitions to CCNU post trial   Seizures Vimpat  was previously reduced to 50mg  AM and 100mg  at night after attributing balance issues and fatigue. She has been taking 24m AM and 50mg  at night with no reported seizures. Ok to proceed at this dose. Monitor closely.  Expressive Aphasia Mild worsening over the past  couple of months. She has tapered off of decadron  and remains on bevacizumab with trial. Continue to monitor.   Follow Up: Scheduled below  The above diagnoses, treatment plan, and options were discussed with the patient/family.  They have agreed to the above plan.  They were allowed to ask questions and these were answered to their satisfaction.  There were no barriers to understanding.  A high level of complex medical decision making was utilized for the medical management of this high risk patient. The evaluation and management of this patient encompassed time counseling the patient/family (on disease symptoms, treatment/side effects and/or prognosis) and coordination of care.    ONCOLOGY HISTORY   Oncology History  Left temporal glioblastoma, IDH-wildtype, CNS WHO grade 4 (CMS-HCC)  04/04/2022 Presentation   Presented with acute-onset aphasia felt to be a seizure.   04/05/2022 Radiology Study   Brain MRI revealed 3 cm region of masslike signal abnormality in the left temporal lobe with small foci of enhancement, concerning for primary CNS neoplasm.   04/18/2022 Surgery   Awake craniotomy for cortical mapping and resection by Dr. Peggye Li at [facility] on 04/18/22. Pathology: Glioblastoma, IDH-wildtype, CNS WHO grade 4.   04/18/2022 Initial Diagnosis   Glioblastoma, IDH-wildtype, CNS WHO grade 4.   04/27/2022 Clinical Event-Other   New patient evaluation at [facility]. Recommend XRT with concurrent temozolomide  75 mg/m PO daily x 6 weeks.    05/18/2022 - 06/29/2022 Radiation   She competed 6  weeks of XRT/TMZ on 06/29/2022.    08/2022 - 01/04/2023 Chemotherapy   Starts 5-day temozolomide . Completes 6 cycles of 5-day Temodar  in May 2024.   06/26/2023 Progression   MRI on 06/26/23 reveals a small new enhancing lesion at the temporal horn of the left lateral ventricle concerning for disease progression. Dr. Esteban reached out to her neurosurgeon, Dr.  Peggye Li, for possible biopsy/resection of new enhancing lesion.    07/04/2023 Surgery   Surgical biopsy by Dr. Peggye Li: pathology mostly c/w necrosis + residual/recurrent tumor    - 12/20/2023 Chemotherapy   Completed two additional cycles of 5-day temozolomide    12/30/2023 Progression   Radiographic evidence of increased enhancing disease and T2 FLAIR, patient with progressive expressive aphasia. Presented MOAB for consideration. If not, recommend lomustine 90mg /m2 + Bev 10mg /kg IV every 2 weeks +/- SRS.   01/27/2024 -  Research Study Participant   Enrolled in [id number]: A Multicenter Trial to Identify Optimal Atezolizumab Biomarkers in the Setting of Recurrent Glioblastoma. The MOAB Trial    03/05/2024 -  Chemotherapy   OP A Brain Tumor bevacizumab 7.5 mg/kg IV 42 day (for radiation necrosis) Plan Provider: Toya Esteban, MD Treatment goal: Control Line of treatment: [No plan line of treatment]     The patient's disease status is up-to-date as described in the oncology history above.   INTERVAL HISTORY   History of Present Illness Yvonne [name] is a 78 year old female with glioblastoma who presents with progression of her tumor and mild worsening expressive aphasia.  She has been experiencing expressive aphasia, which has been consistent since the beginning of her illness and worsened around July. Despite participating in a clinical trial and receiving two to three rounds of Avastin, her aphasia has not improved.  She recently underwent a scan that showed significant progression of her glioblastoma. This progression was unexpected as a previous CT scan, conducted after a fall two to three weeks ago, indicated decreased swelling and tumor size. However, the latest scan contradicts this, showing an increase in tumor size.  Her current medication regimen includes Vimpat , which she takes at a dose of 50 mg in the morning and 50 mg at night. She previously attempted a  higher dose but experienced dizziness and imbalance, leading to a reduction in dosage. She is not currently on dexamethasone  due to side effects, including diarrhea and increased urination, which are exacerbated by her diabetes.  She has not had any recent seizures and is managing her symptoms at home, maintaining her ability to eat, drink, and walk independently. However, there is a slight balance issue noted.     HISTORY AND MEDICATIONS   Past Medical History:  Diagnosis Date  . Anxiety   . Arthritis   . Diabetes mellitus, type 2 (CMS/HHS-HCC)   . HLD (hyperlipidemia)   . HTN (hypertension)   . Hypothyroidism   . Left temporal glioblastoma, IDH-wildtype, CNS WHO grade 4 (CMS-HCC)   . Melanoma (CMS/HHS-HCC)    chest, removed in situ  . MVP (mitral valve prolapse)   . Neuropathy   . OSA (obstructive sleep apnea)   . Rosacea   . Tinnitus    Past Surgical History:  Procedure Laterality Date  . TONSILLECTOMY  1958  . ESOPHAGOGASTRODUODENOSCOPY  11/16/2021  . CRANIOTOMY W/EXCISION SUPRATENTORIAL BRAIN TUMOR Left 04/18/2022   Procedure: **AWAKE** ANTERIOR TEMPORAL CRANIOTOMY W/EXCISION SUPRATENTORIAL BRAIN TUMOR;  Surgeon: Li Peggye Barrio, MD;  Location: [facility]PERATING ROOMS;  Service: Neurosurgery;  Laterality: Left;  ANTERIOR TEMPORAL  .  MICROSURGERY Left 04/18/2022   Procedure: MICROSURGERY;  Surgeon: Saturnino Peggye Barrio, MD;  Location: [facility];  Service: Neurosurgery;  Laterality: Left;  . APPLICATION CRANIAL TONGS CALIPER/STEREOTACTIC FRAME Left 04/18/2022   Procedure: APPLICATION CRANIAL TONGS CALIPER/STEREOTACTIC FRAME;  Surgeon: Saturnino Peggye Barrio, MD;  Location: [facility];  Service: Neurosurgery;  Laterality: Left;  SABRA MAPPING CORTICAL & SUBCORTICAL FUNCTION TO IDENTIFY BRAIN STRUCTURE Left 04/18/2022   Procedure: FUNC CORTICAL AND SUBCORTICAL MAPPING AND/RECORD ELECTRODE ON BRAIN SURFACE OR DEPTH ELECTRODES, TO PROVOKE SEIZURES/ID VITAL BRAIN  STRUCTURES; INITIAL HR ATTEND BY PHYS/QUALIFIED HEALTH PROFESSIONAL;  Surgeon: Saturnino Peggye Barrio, MD;  Location: [facility];  Service: Neurosurgery;  Laterality: Left;  . FUNCTIONAL CORTICAL & SUBCORTICAL MAPPING TO IDENTIFY BRAIN STRUCTURES Left 04/18/2022   Procedure: FUNC CORTICAL AND SUBCORTICAL MAPPING AND/RECORD ELECTRODES ON BRAIN SURFACE OR DEPTH ELECTRODES, PROVOKE SEIZURES/ID VITAL BRAIN STRUCTURES; EA ADD'L HR ATTEND BY PHYS/QUALIFIED HEALTH PROFESSIONAL;  Surgeon: Saturnino Peggye Barrio, MD;  Location: [facility];  Service: Neurosurgery;  Laterality: Left;  . STERIOTACTIC BIOPSY/ASPIRATION/EXCISION INTRACRANIAL LESION Left 07/04/2023   Procedure: *AIRO 3* STERIOTACTIC BIOPSY/ASPIRATION/EXCISION INTRACRANIAL LESION;  Surgeon: Saturnino Peggye Barrio, MD;  Location: [facility];  Service: Neurosurgery;  Laterality: Left;  . APPLICATION CRANIAL TONGS CALIPER/STEREOTACTIC FRAME Left 07/04/2023   Procedure: APPLICATION CRANIAL TONGS CALIPER/STEREOTACTIC FRAME;  Surgeon: Saturnino Peggye Barrio, MD;  Location: [facility];  Service: Neurosurgery;  Laterality: Left;  . STERIOTACTIC BIOPSY/ASPIRATION/EXCISION INTRACRANIAL LESION Left 02/19/2024   Procedure: *AIRO 3* STERIOTACTIC BIOPSY/ASPIRATION/EXCISION INTRACRANIAL LESION;  Surgeon: Saturnino Peggye Barrio, MD;  Location: [facility];  Service: Neurosurgery;  Laterality: Left;  temporal  . APPLICATION CRANIAL TONGS CALIPER/STEREOTACTIC FRAME Left 02/19/2024   Procedure: APPLICATION CRANIAL TONGS CALIPER/STEREOTACTIC FRAME;  Surgeon: Saturnino Peggye Barrio, MD;  Location: [facility];  Service: Neurosurgery;  Laterality: Left;  temporal  . APPENDECTOMY    . COLONOSCOPY    . ENDOSCOPIC CARPAL TUNNEL RELEASE Bilateral   . HYSTERECTOMY    . lipoma removed lower abdomen      Family History  Problem Relation Age of Onset  . High blood pressure (Hypertension) Mother   . Heart disease Mother   .  Dementia Mother   . Heart disease Father   . High blood pressure (Hypertension) Father   . Diabetes Father   . Pancreatic cancer Brother   . Stroke Maternal Grandmother   . Heart disease Maternal Grandfather   . Throat cancer Maternal Uncle   . Brain cancer Paternal Aunt   . Ovarian cancer Cousin   . Breast cancer Cousin   . Prostate cancer Cousin   . Uterine cancer Cousin   . Throat cancer Cousin   . Stomach cancer Cousin   . Anesthesia problems Neg Hx    Social History   Socioeconomic History  . Marital status: Married    Spouse name: Sharman  Occupational History  . 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 Smoking status: Former    Current packs/day: 0.00    Average packs/day: 0.2 packs/day for 3.0 years (0.6 ttl pk-yrs)    Types: Cigarettes    Start date: 77    Quit date: 1970    Years since quitting: 55.7  . Smokeless tobacco: Never  Vaping Use  . Vaping status: Never Used  Substance and Sexual Activity  . Alcohol use: Not Currently    Comment: previously rare socially  . Drug use: Defer  . Sexual activity:  Not Currently    Partners: Male   Social Drivers of Health   Financial Resource Strain: Low Risk  (02/20/2024)   Overall Financial Resource Strain (CARDIA)   . Difficulty of Paying Living Expenses: Not hard at all  Food Insecurity: No Food Insecurity (02/20/2024)   Hunger Vital Sign   . Worried About Programme researcher, broadcasting/film/video in the Last Year: Never true   . Ran Out of Food in the Last Year: Never true  Transportation Needs: No Transportation Needs (02/20/2024)   PRAPARE - Transportation   . Lack of Transportation (Medical): No   . Lack of Transportation (Non-Medical): No  Housing Stability: Patient Declined (02/19/2024)   Housing Stability Vital Sign   . Unable to Pay for Housing in the Last Year: Patient declined   . Homeless in the Last Year: Patient declined    Birth Control Method: The current method  of family planning is previously discussed.No LMP recorded.  Allergies  Allergen Reactions  . Sulfa (Sulfonamide Antibiotics) Other (See Comments)    Other reaction(s): stomach upset  . Tobramycin Anaphylaxis  . Erythromycin Nausea    Current Outpatient Medications  Medication Sig Dispense Refill  . acetaminophen  (TYLENOL ) 325 MG tablet Take 3 tablets (975 mg total) by mouth every 6 (six) hours as needed for Pain 180 tablet   . cholecalciferol  (VITAMIN D3) 2,000 unit capsule Take 2,000 Units by mouth once daily    . empagliflozin (JARDIANCE) 10 mg tablet Take 10 mg by mouth once daily Last dose 02/18/2024    . fluticasone  propionate (FLONASE ) 50 mcg/actuation nasal spray Place 2 sprays into both nostrils at bedtime as needed    . gabapentin  (NEURONTIN ) 300 MG capsule Take 300 mg by mouth at bedtime    . lacosamide  (VIMPAT ) 50 mg tablet Take 50mg  in the morning and 100mg  at night (Patient taking differently: Take 50mg  in the morning and 50mg  at night) 90 tablet 1  . levothyroxine  (SYNTHROID ) 88 MCG tablet Take 88 mcg by mouth once daily Take on an empty stomach with a glass of water at least 30-60 minutes before breakfast.    . loperamide (IMODIUM A-D) 2 mg tablet Take 2 mg by mouth as needed for Diarrhea    . metFORMIN (GLUCOPHAGE) 500 MG tablet Take 500 mg by mouth 2 (two) times daily    . metoprolol  succinate (TOPROL -XL) 100 MG XL tablet Take 100 mg by mouth once daily    . ondansetron  (ZOFRAN ) 8 MG tablet Take 8 mg by mouth every 8 (eight) hours as needed    . rosuvastatin  (CRESTOR ) 5 MG tablet Take 5 mg by mouth at bedtime     No current facility-administered medications for this visit.   Facility-Administered Medications Ordered in Other Visits  Medication Dose Route Frequency Provider Last Rate Last Admin  . LORazepam  (ATIVAN ) 2 mg/mL injection 0.5 mg  0.5 mg Intravenous Once PRN Southerland, Cindy Cheng-Ja, NP      . LORazepam  (ATIVAN ) tablet 0.5 mg  0.5 mg Oral Once PRN  Southerland, Dorthea Canard, NP      . OK to Treat: Nurse   XX Once Southerland, Dorthea Canard, NP      . OK to Treat: Nurse   XX Once Southerland, Dorthea Canard, NP      . prochlorperazine  (COMPAZINE ) injection 5 mg  5 mg Intravenous Once PRN Alen Reggy Gee, NP      . prochlorperazine  (COMPAZINE ) tablet 5 mg  5 mg Oral Once PRN Alen Reggy Gee,  NP      . [facility] ONC atezolizumab (BTC# 113395/MOAB) 1,200 mg in sodium chloride  0.9% 250 mL (total volume) -eIRB [id number] infusion  1,200 mg Intravenous Once Esteban Hollering, MD         PHYSICAL EXAMINATION  BP (!) 161/69 (BP Location: Left upper arm, Patient Position: Sitting, BP Cuff Size: Adult)   Pulse 71   Temp 36.9 C (98.5 F) (Oral)   Resp 17   Ht 164.5 cm (5' 4.76)   Wt 70.8 kg (156 lb 1.4 oz)   SpO2 98%   BMI 26.16 kg/m  Body mass index is 26.16 kg/m. Body surface area is 1.8 meters squared. Wt Readings from Last 3 Encounters:  04/22/24 70.8 kg (156 lb 1.4 oz)  03/26/24 70.1 kg (154 lb 8.7 oz)  03/05/24 67.5 kg (148 lb 13 oz)   KPS: 70  Cares for self, unable to carry on normal activity/active work  Distress Score:  4 General:moderate distress HEENT: conjunctivae clear, sclerae anicteric, mucous membranes moist, and oropharynx clear Neck:no adenopathy and supple with normal range of motion                      Cardiovascular:regular rate and rhythm, no audible murmur, normal distal pulses Lungs / Chest:lungs clear to auscultation bilaterally, no rales, rhonchi, or wheezes, normal respiratory effort Extremities:capillary refill < 2 sec, no clubbing, no cyanosis, no edema  Neurologic Exam Mental Status: alertness: alert, orientation: person, place, time, affect: normal,  Speech: expressive aphasia. She has difficulty with naming objects.   Cranial nerves:  II: visual fields are full by confrontation,pupils equal, round, reactive to light and accommodation, no ptosis III/IV/VI: extra-ocular motions intact  bilaterally V/VII:no evidence of facial droop or weakness and facial sensation intact VIII: hearing normal IX: soft palate elevation normal midline IX,X: gag reflex deferred XI: trapezius strength symmetric,  sternocleidomastoid strength symmetric XII: tongue strength symmetric   Motor:strength symmetric 5/5, normal muscle mass and tone in all extremities and no pronator drift Sensory: intact to light touch Coordination: intact finger to nose, heel to shin and rapid alternating movements Gait: normal and tandem gait is normal.  LABS/STUDIES  Laboratory: Lab Results  Component Value Date   WBC 7.3 04/22/2024   HGB 12.7 04/22/2024   HCT 39.2 04/22/2024   PLT 194 04/22/2024   MCV 82 04/22/2024   MCH 26.5 04/22/2024   MCHC 32.4 04/22/2024   RBC 4.79 04/22/2024   MPV 9.0 04/22/2024   RDWCV 14.3 04/22/2024   NA 137 04/22/2024   K 3.7 04/22/2024   CL 101 04/22/2024   CO2 24 04/22/2024   BUN 18 04/22/2024   GLUCOSE 138 04/22/2024   CALCIUM  9.1 04/22/2024   AST 18 04/22/2024   ALT 17 04/22/2024   ALKPHOS 56 04/22/2024   ALB 3.8 04/22/2024   ANIONGAP 12 04/22/2024     Imaging: BTC MRI Interpretation: MRI from 02/18/2024  was compared to MRI from 04/22/2024  by HOLLERING ESTEBAN, MD. Findings consistent with progressive disease.  CO-MANAGING PHYSICIANS  Patient Care Team: Cleotilde Olam CROME, MD as PCP - General (Family Medicine) Buckley, Arthea Harpin, MD (Neurosurgery) Bonney Delon LABOR, RN as Case Manager  FUTURE APPOINTMENTS   Future Appointments     Date/Time Provider Department Center Visit Type   05/20/2024 9:45 AM (Arrive by 9:30 AM) [facility] MR 1 [facility] MRI [facility] BRAIN [facility]   05/20/2024 11:50 AM (Arrive by 11:35 AM) [facility] Lab Draw  Cancer Ctr LAB   05/20/2024 1:00 PM (Arrive by 12:45 PM) Esteban Hollering, MD [facility] RETURN VISIT   05/20/2024 4:00 PM (Arrive by 3:30 PM) OTC [facility]  Oncology Treatment Cancer Ctr ONCOLOGY TX 2HR   06/03/2024 7:40 AM (Arrive by 7:25 AM) LAB-CANCER CENTER [facility] Lab Draw [facility] Ctr LAB   06/03/2024 8:30 AM (Arrive by 8:15 AM) Southerland, Dorthea Canard, NP [facility] RETURN VISIT   06/03/2024 9:30 AM (Arrive by 9:00 AM) OTC [facility] Oncology Treatment [facility]tr ONCOLOGY TX 2HR   06/25/2024 7:30 AM (Arrive by 7:15 AM) [facility]ENTER [facility] Lab Draw [facility] Ctr LAB   07/02/2024 8:30 AM (Arrive by 8:15 AM) Southerland, Dorthea Canard, NP [facility] RETURN VISIT   06/13/2024 9:30 AM (Arrive by 9:00 AM) OTC [facility] Oncology Treatment [facility] Ctr ONCOLOGY TX 2HR   07/15/2024 7:40 AM (Arrive by 7:25 AM) LAB-CANCER CENTER [facility] Lab Draw [facility] Ctr LAB   07/15/2024 8:30 AM (Arrive by 8:15 AM) Southerland, Dorthea Canard, NP [facility] RETURN VISIT        I spent a total of 45 minutes in both face-to-face and non-face-to-face activities for this visit on the date of this encounter.   CLINTON TYLER HOBBS, NP  This note has been created using automated tools and reviewed for accuracy by CLINTON TYLER HOBBS   Attestation Statement:   I personally saw the patient and performed a substantive portion of the medical decision making, in conjunction with the Advanced Practice Provider for the condition/treatment of   left temporal glioblastoma.  MUSTAFA KHASRAW, MD

## 2024-04-22 NOTE — Progress Notes (Signed)
 [id number]: A Multicenter Trial to Identify Optimal Atezolizumab Biomarkers in the Setting of Recurrent Glioblastoma. The MOAB Trial  C3D1  Atezo #4  Patient reported to clinic 3-1 for MRI review and evaluation per protocol. MRI reviewed by Dr. Esteban.  Patient reports no new or worsening AEs.  Did not start Vimpat  50 mg am and 100mg  PM. Taking 50 mg BID, Quita Bowman , NP made aware. Patient to proceed to OTC for infusion.  Next infusion will be in 4 weeks with an MRI.  Patient and daughter stated they understood and agreed with plan.  Patient and daughter understand to contact the research team with questions to concerns.   Study Treatment: CYCLE  DRUG/TREATMENT ROUTE DOSE FREQUENCY  DATE STOP DATE (Last dose)  Pre-Resection  Atezolizumab  IV 1200 mg Once 01/28/2024 NA  Biopsy      02/19/2024   Post -Resectiom Atezolizumab  IV 1200 mg Every 3 weeks 03/05/2024    Concomitant  Medications: DRUG ROUTE DOSE FREQUENCY START DATE STOP DATE COMMENTS/REASON   Tylenol  PO 325 mg PRN 07/04/2023  Headaches  Vit D 3 PO 2000 u Daily 2004  Dietary supplement   Jardiance PO 10 mg Daily 01/12/2023  Diabetes   Flonase  PO 2 sprays  PRN 2004  Seasonal allergies  Gabapentin  PO 300 mg Daily 2004  Neuopathy hands and feet   Synthroid  PO 88 mcg Daily (HS) 1990  Hyperthyroid   MetFORMIN PO 500 mg BID 01/11/2022  Diabetes   TOPROL -XL PO 100 mg Daily 1995  Hypertension   CRESTOR  PO 5 mg Daily (HS)  1995  Hx of high cholesterol   Imodium PO 1 tab PRN Not started   Diarrhea   Decadron  PO 4 mg Daily X2 days 02/21/2024 02/22/2024 Word finding and headaches    Deacdron PO 2 mg Daily 02/23/2024 03/08/2024 Titration   Decadron  PO 1.5 mg Daily 03/09/2024  03/11/2024 Titration   Decadron  PO 1 mg Daily 03/12/2024 03/15/2024 Titration   Decadron  PO 0.5mg  Daily 03/16/2024 03/18/2024 Titration   Avastin IV 7.5mg /kg Dose: 5oomg Every 3 weeks 03/05/2024  Worsening symptoms   Omeprazole PO 40mg   Daily 03/05/2024 03/18/2024 Prophylaxis with  Decadron    Vimpat   PO  50mg  BID 03/18/2024  Seizure prophylaxis  Vimpat   PO 100mg  BID 03/27/2024 03/29/2024 Seizure Prphylaxis  Vimpat  PO 150mg  BID 03/30/2024 03/31/2024 Seizure Prphylaxis  Vimpat   PO 100mg  BID 04/01/2024 04/08/2024 Seizure Prphylaxis  Vimpat  PO 50mg  (AM)  Daily  04/02/2024  Seizure Porphylaxis,   Adverse Events:  CTCAE  DESCRIPTION   START  DATE   END DATE   GRADE   ATTRIBUTION   COMMENT/Treatment  Headaches Intermittent  07/04/2023  1 BASELINE Tylenol   Memory Impairment  Short term 12/30/2023  1 BASELINE  No meds  Dysphasia  Word finding 12/30/2023  1 BASELINE No meds  Cognitive Disturbance  comprehension 12/30/2023  1 BASELINE No meds  Endocrine disorders - Other,  specify Diabetes  01/11/2022  2 BASELINE Jardiance, MetFORMIN   Hypertension   1995  2 BASELINE TOPROL -XL  Hyperlipidemia  High cholesterol  1995  2 BASELINE CRESTOR   Allergic Rhinitis  Seasonal allergies  2004  1 BASELINE Flonase   Paresthesia Feet  2004  1 BASELINE Gabapentin   hypothyroidism  1990  1 BASELINE Synthroid   Diarrhea  01/29/2024 01/30/2024 1  Imodium as needed  Confusion  03/06/2024  1    Hypertension  03/06/2024  2    Fall CT scan negative  03/23/2024 03/23/2024 1  Fall , ER  CT scan Negative   Injury, poisoning and procedural complications - Other, specify  Minor scrapes to face from fall  03/23/2024  1    Gait disturbance Balance issues, requiring assistance with walking 03/30/2024  1     Karna Ronde RN, BSN

## 2024-04-22 NOTE — Progress Notes (Signed)
 Requested that images from brain MRI done on 04/22/24 be pushed to [facility].  Email to [employer] to link images to outside films order.

## 2024-04-23 ENCOUNTER — Telehealth: Payer: Self-pay | Admitting: *Deleted

## 2024-04-23 NOTE — Telephone Encounter (Signed)
 PC to patient's daughter, Reena - no answer, left VM - informed her we have received images from MRI at [facility].  Phone visit scheduled with Dr Buckley on 04/28/24 at 2:30.  Instructed Reena to call if she needs to change appointment.

## 2024-04-28 ENCOUNTER — Inpatient Hospital Stay: Attending: Internal Medicine | Admitting: Internal Medicine

## 2024-04-28 DIAGNOSIS — C712 Malignant neoplasm of temporal lobe: Secondary | ICD-10-CM | POA: Diagnosis not present

## 2024-04-28 NOTE — Progress Notes (Signed)
 I connected with Yvonne [name] on 04/28/24 at  2:30 PM EDT by telephone visit and verified that I am speaking with the correct person using two identifiers.  I discussed the limitations, risks, security and privacy concerns of performing an evaluation and management service by telemedicine and the availability of in-person appointments. I also discussed with the patient that there may be a patient responsible charge related to this service. The patient expressed understanding and agreed to proceed.   Other persons participating in the visit and their role in the encounter:  daughter  Patient's location:  Home Provider's location:  Office Chief Complaint:  Glioblastoma of temporal lobe [facility])  History of Present Ilness: Yvonne [name] reports some decline in language expression, and dropping things out the right hand since prior visit.  She has been on clinical study at [facility], [facility].  Recently underwent infusion with atezolizumab and avastin on 04/22/24.  No seizures, headaches.    Observations: Language and cognition at baseline  Assessment and Plan: Glioblastoma of temporal lobe [facility])  Patient and daughter are unsure if they want to continue on study given recent progression on MRI from 04/22/24.    We did discuss CCNU and avastin as an option if they want to look at next line therapies, off study, in local setting.  We could get started week of 9/29 to allow for washout of study medications, if interested.  Follow Up Instructions: RTC TBD pending goals of care decisions  I discussed the assessment and treatment plan with the patient.  The patient was provided an opportunity to ask questions and all were answered.  The patient agreed with the plan and demonstrated understanding of the instructions.    The patient was advised to call back or seek an in-person evaluation if the symptoms worsen or if the condition fails to improve as anticipated.    Yvonne [name] K Kaylina Cahue, MD   I provided 20  minutes of non face-to-face telephone visit time during this encounter, and > 50% was spent counseling as documented under my assessment & plan.

## 2024-04-29 NOTE — Progress Notes (Signed)
 [id number]: A Multicenter Trial to Identify Optimal Atezolizumab Biomarkers in the Setting of Recurrent Glioblastoma. The [facility] Trial  Me to Esteban Hollering, MD . Alen Reggy Gee, NP . Elvera Rima, RN . Dasie Craven, [facility] 04/29/24 12:33 PM I spoke to Yvonne's daughter. She has several requests/3 options She would like to move the MRI up a week to Oct 1. (1 week early) See what the MRI shows.  If progressive come off study, if not stay on study. If progressive start CCNU Come off study and start CCNU   I asked her if her mom's symptoms have worsened, first she said no then she said she had increase right hand weakness but working on strengthening grip. She would like a phone call from Dr. Esteban or Quita to discuss options. She wants to do everything she can to stop the cancer from growing and think sthe ATEZO is not working.  Might be hard to get an MRI 10/1 and a return visit in clinic...will have to see availability.   Please let me know your thoughts.  Thanks, Karna Manuelita Ellen to [facility] Triage Btc Team 7 Khasraw (supporting [name] Esteban, MD) (Selected Message)   04/29/24  7:51 AM Need to speak with Lexine or Nurse Practitioner this morning  Study Treatment: CYCLE  DRUG/TREATMENT ROUTE DOSE FREQUENCY  DATE STOP DATE (Last dose)  Pre-Resection  Atezolizumab  IV 1200 mg Once 01/28/2024 NA  Biopsy      02/19/2024   Post -Resectiom Atezolizumab  IV 1200 mg Every 3 weeks 03/05/2024    Concomitant  Medications: DRUG ROUTE DOSE FREQUENCY START DATE STOP DATE COMMENTS/REASON   Tylenol  PO 325 mg PRN 07/04/2023  Headaches  Vit D 3 PO 2000 u Daily 2004  Dietary supplement   Jardiance PO 10 mg Daily 01/12/2023  Diabetes   Flonase  PO 2 sprays  PRN 2004  Seasonal allergies  Gabapentin  PO 300 mg Daily 2004  Neuopathy hands and feet   Synthroid  PO 88 mcg Daily (HS) 1990  Hyperthyroid   MetFORMIN PO 500 mg BID 01/11/2022  Diabetes   TOPROL -XL PO 100 mg Daily 1995  Hypertension    CRESTOR  PO 5 mg Daily (HS)  1995  Hx of high cholesterol   Imodium PO 1 tab PRN Not started   Diarrhea   Decadron  PO 4 mg Daily X2 days 02/21/2024 02/22/2024 Word finding and headaches    Deacdron PO 2 mg Daily 02/23/2024 03/08/2024 Titration   Decadron  PO 1.5 mg Daily 03/09/2024  03/11/2024 Titration   Decadron  PO 1 mg Daily 03/12/2024 03/15/2024 Titration   Decadron  PO 0.5mg  Daily 03/16/2024 03/18/2024 Titration   Avastin IV 7.5mg /kg Dose: 5oomg Every 3 weeks 03/05/2024  Worsening symptoms   Omeprazole PO 40mg   Daily 03/05/2024 03/18/2024 Prophylaxis with Decadron    Vimpat   PO  50mg  BID 03/18/2024  Seizure prophylaxis  Vimpat   PO 100mg  BID 03/27/2024 03/29/2024 Seizure Prphylaxis  Vimpat  PO 150mg  BID 03/30/2024 03/31/2024 Seizure Prphylaxis  Vimpat   PO 100mg  BID 04/01/2024 04/08/2024 Seizure Prphylaxis  Vimpat  PO 50mg  (AM)  Daily  04/02/2024  Seizure Porphylaxis,   Adverse Events:  CTCAE  DESCRIPTION   START  DATE   END DATE   GRADE   ATTRIBUTION   COMMENT/Treatment  Headaches Intermittent  07/04/2023  1 BASELINE Tylenol   Memory Impairment  Short term 12/30/2023  1 BASELINE  No meds  Dysphasia  Word finding 12/30/2023  1 BASELINE No meds  Cognitive Disturbance  comprehension 12/30/2023  1 BASELINE No meds  Endocrine disorders - Other,  specify Diabetes  01/11/2022  2 BASELINE Jardiance, MetFORMIN   Hypertension   1995  2 BASELINE TOPROL -XL  Hyperlipidemia  High cholesterol  1995  2 BASELINE CRESTOR   Allergic Rhinitis  Seasonal allergies  2004  1 BASELINE Flonase   Paresthesia Feet  2004  1 BASELINE Gabapentin   hypothyroidism  1990  1 BASELINE Synthroid   Diarrhea  01/29/2024 01/30/2024 1  Imodium as needed  Confusion  03/06/2024  1    Hypertension  03/06/2024  2    Fall CT scan negative  03/23/2024 03/23/2024 1  Fall , ER CT scan Negative   Injury, poisoning and procedural complications - Other, specify  Minor scrapes to face from fall  03/23/2024  1    Gait disturbance Balance issues, requiring assistance with  walking 03/30/2024  1     Karna Ronde RN, BSN

## 2024-05-04 NOTE — Progress Notes (Signed)
 [id number]: A Multicenter Trial to Identify Optimal Atezolizumab Biomarkers in the Setting of Recurrent Glioblastoma. The MOAB Trial  Spoke to patients' daughter today, 05/04/2024.  requesting an antidepressant for her mom.  Reports she has been crying every day for 4 weeks. Team made aware.   Study Treatment: CYCLE  DRUG/TREATMENT ROUTE DOSE FREQUENCY  DATE STOP DATE (Last dose)  Pre-Resection  Atezolizumab  IV 1200 mg Once 01/28/2024 NA  Biopsy      02/19/2024   Post -Resectiom Atezolizumab  IV 1200 mg Every 3 weeks 03/05/2024    Concomitant  Medications: DRUG ROUTE DOSE FREQUENCY START DATE STOP DATE COMMENTS/REASON   Tylenol  PO 325 mg PRN 07/04/2023  Headaches  Vit D 3 PO 2000 u Daily 2004  Dietary supplement   Jardiance PO 10 mg Daily 01/12/2023  Diabetes   Flonase  PO 2 sprays  PRN 2004  Seasonal allergies  Gabapentin  PO 300 mg Daily 2004  Neuopathy hands and feet   Synthroid  PO 88 mcg Daily (HS) 1990  Hyperthyroid   MetFORMIN PO 500 mg BID 01/11/2022  Diabetes   TOPROL -XL PO 100 mg Daily 1995  Hypertension   CRESTOR  PO 5 mg Daily (HS)  1995  Hx of high cholesterol   Imodium PO 1 tab PRN Not started   Diarrhea   Decadron  PO 4 mg Daily X2 days 02/21/2024 02/22/2024 Word finding and headaches    Deacdron PO 2 mg Daily 02/23/2024 03/08/2024 Titration   Decadron  PO 1.5 mg Daily 03/09/2024  03/11/2024 Titration   Decadron  PO 1 mg Daily 03/12/2024 03/15/2024 Titration   Decadron  PO 0.5mg  Daily 03/16/2024 03/18/2024 Titration   Avastin IV 7.5mg /kg Dose: 5oomg Every 3 weeks 03/05/2024  Worsening symptoms   Omeprazole PO 40mg   Daily 03/05/2024 03/18/2024 Prophylaxis with Decadron    Vimpat   PO  50mg  BID 03/18/2024  Seizure prophylaxis  Vimpat   PO 100mg  BID 03/27/2024 03/29/2024 Seizure Prphylaxis  Vimpat  PO 150mg  BID 03/30/2024 03/31/2024 Seizure Prphylaxis  Vimpat   PO 100mg  BID 04/01/2024 04/08/2024 Seizure Prphylaxis  Vimpat  PO 50mg  (AM)  Daily  04/02/2024  Seizure Porphylaxis,   Adverse Events:  CTCAE   DESCRIPTION   START  DATE   END DATE   GRADE   ATTRIBUTION   COMMENT/Treatment  Headaches Intermittent  07/04/2023  1 BASELINE Tylenol   Memory Impairment  Short term 12/30/2023  1 BASELINE  No meds  Dysphasia  Word finding 12/30/2023  1 BASELINE No meds  Cognitive Disturbance  comprehension 12/30/2023  1 BASELINE No meds  Endocrine disorders - Other,  specify Diabetes  01/11/2022  2 BASELINE Jardiance, MetFORMIN   Hypertension   1995  2 BASELINE TOPROL -XL  Hyperlipidemia  High cholesterol  1995  2 BASELINE CRESTOR   Allergic Rhinitis  Seasonal allergies  2004  1 BASELINE Flonase   Paresthesia Feet  2004  1 BASELINE Gabapentin   hypothyroidism  1990  1 BASELINE Synthroid   Diarrhea  01/29/2024 01/30/2024 1  Imodium as needed  Confusion  03/06/2024  1    Hypertension  03/06/2024  2    Fall CT scan negative  03/23/2024 03/23/2024 1  Fall , ER CT scan Negative   Injury, poisoning and procedural complications - Other, specify  Minor scrapes to face from fall  03/23/2024  1    Gait disturbance Balance issues, requiring assistance with walking 03/30/2024  1    Depression  04/03/2004  1  Medication TBD    Karna Ronde RN, BSN

## 2024-05-06 ENCOUNTER — Other Ambulatory Visit: Payer: Self-pay

## 2024-05-06 ENCOUNTER — Observation Stay (HOSPITAL_COMMUNITY)

## 2024-05-06 ENCOUNTER — Encounter (HOSPITAL_COMMUNITY): Payer: Self-pay | Admitting: Hospitalist

## 2024-05-06 ENCOUNTER — Inpatient Hospital Stay (HOSPITAL_COMMUNITY)
Admission: EM | Admit: 2024-05-06 | Discharge: 2024-05-10 | DRG: 101 | Disposition: A | Discharge status: Hospice | Attending: Family Medicine | Admitting: Family Medicine

## 2024-05-06 ENCOUNTER — Emergency Department (HOSPITAL_COMMUNITY)

## 2024-05-06 DIAGNOSIS — Z85820 Personal history of malignant melanoma of skin: Secondary | ICD-10-CM

## 2024-05-06 DIAGNOSIS — Z800 Family history of malignant neoplasm of digestive organs: Secondary | ICD-10-CM

## 2024-05-06 DIAGNOSIS — Z833 Family history of diabetes mellitus: Secondary | ICD-10-CM | POA: Diagnosis not present

## 2024-05-06 DIAGNOSIS — Z79890 Hormone replacement therapy (postmenopausal): Secondary | ICD-10-CM | POA: Diagnosis not present

## 2024-05-06 DIAGNOSIS — G4733 Obstructive sleep apnea (adult) (pediatric): Secondary | ICD-10-CM | POA: Diagnosis present

## 2024-05-06 DIAGNOSIS — Z7189 Other specified counseling: Secondary | ICD-10-CM

## 2024-05-06 DIAGNOSIS — Z8052 Family history of malignant neoplasm of bladder: Secondary | ICD-10-CM | POA: Diagnosis not present

## 2024-05-06 DIAGNOSIS — G9389 Other specified disorders of brain: Secondary | ICD-10-CM | POA: Diagnosis present

## 2024-05-06 DIAGNOSIS — R29810 Facial weakness: Secondary | ICD-10-CM | POA: Diagnosis present

## 2024-05-06 DIAGNOSIS — F419 Anxiety disorder, unspecified: Secondary | ICD-10-CM | POA: Diagnosis present

## 2024-05-06 DIAGNOSIS — Z882 Allergy status to sulfonamides status: Secondary | ICD-10-CM

## 2024-05-06 DIAGNOSIS — Z803 Family history of malignant neoplasm of breast: Secondary | ICD-10-CM

## 2024-05-06 DIAGNOSIS — C719 Malignant neoplasm of brain, unspecified: Secondary | ICD-10-CM | POA: Diagnosis not present

## 2024-05-06 DIAGNOSIS — Z515 Encounter for palliative care: Secondary | ICD-10-CM

## 2024-05-06 DIAGNOSIS — Z823 Family history of stroke: Secondary | ICD-10-CM

## 2024-05-06 DIAGNOSIS — E039 Hypothyroidism, unspecified: Secondary | ICD-10-CM | POA: Diagnosis present

## 2024-05-06 DIAGNOSIS — C712 Malignant neoplasm of temporal lobe: Secondary | ICD-10-CM | POA: Diagnosis present

## 2024-05-06 DIAGNOSIS — G40119 Localization-related (focal) (partial) symptomatic epilepsy and epileptic syndromes with simple partial seizures, intractable, without status epilepticus: Secondary | ICD-10-CM

## 2024-05-06 DIAGNOSIS — Z87891 Personal history of nicotine dependence: Secondary | ICD-10-CM | POA: Diagnosis not present

## 2024-05-06 DIAGNOSIS — Z66 Do not resuscitate: Secondary | ICD-10-CM | POA: Diagnosis present

## 2024-05-06 DIAGNOSIS — R29818 Other symptoms and signs involving the nervous system: Secondary | ICD-10-CM | POA: Diagnosis not present

## 2024-05-06 DIAGNOSIS — I341 Nonrheumatic mitral (valve) prolapse: Secondary | ICD-10-CM | POA: Diagnosis not present

## 2024-05-06 DIAGNOSIS — E0500 Thyrotoxicosis with diffuse goiter without thyrotoxic crisis or storm: Secondary | ICD-10-CM | POA: Diagnosis present

## 2024-05-06 DIAGNOSIS — Z8261 Family history of arthritis: Secondary | ICD-10-CM

## 2024-05-06 DIAGNOSIS — Z801 Family history of malignant neoplasm of trachea, bronchus and lung: Secondary | ICD-10-CM

## 2024-05-06 DIAGNOSIS — Z79899 Other long term (current) drug therapy: Secondary | ICD-10-CM | POA: Diagnosis not present

## 2024-05-06 DIAGNOSIS — E1140 Type 2 diabetes mellitus with diabetic neuropathy, unspecified: Secondary | ICD-10-CM | POA: Diagnosis not present

## 2024-05-06 DIAGNOSIS — Z7984 Long term (current) use of oral hypoglycemic drugs: Secondary | ICD-10-CM | POA: Diagnosis not present

## 2024-05-06 DIAGNOSIS — E785 Hyperlipidemia, unspecified: Secondary | ICD-10-CM | POA: Diagnosis present

## 2024-05-06 DIAGNOSIS — Z90710 Acquired absence of both cervix and uterus: Secondary | ICD-10-CM

## 2024-05-06 DIAGNOSIS — G40101 Localization-related (focal) (partial) symptomatic epilepsy and epileptic syndromes with simple partial seizures, not intractable, with status epilepticus: Principal | ICD-10-CM | POA: Diagnosis present

## 2024-05-06 DIAGNOSIS — Z8249 Family history of ischemic heart disease and other diseases of the circulatory system: Secondary | ICD-10-CM

## 2024-05-06 DIAGNOSIS — I10 Essential (primary) hypertension: Secondary | ICD-10-CM | POA: Diagnosis present

## 2024-05-06 DIAGNOSIS — Z881 Allergy status to other antibiotic agents status: Secondary | ICD-10-CM

## 2024-05-06 DIAGNOSIS — C713 Malignant neoplasm of parietal lobe: Secondary | ICD-10-CM | POA: Diagnosis not present

## 2024-05-06 DIAGNOSIS — Z8049 Family history of malignant neoplasm of other genital organs: Secondary | ICD-10-CM

## 2024-05-06 DIAGNOSIS — R4701 Aphasia: Secondary | ICD-10-CM | POA: Diagnosis present

## 2024-05-06 DIAGNOSIS — R220 Localized swelling, mass and lump, head: Secondary | ICD-10-CM | POA: Diagnosis not present

## 2024-05-06 DIAGNOSIS — Z8042 Family history of malignant neoplasm of prostate: Secondary | ICD-10-CM | POA: Diagnosis not present

## 2024-05-06 DIAGNOSIS — R569 Unspecified convulsions: Principal | ICD-10-CM

## 2024-05-06 DIAGNOSIS — Z8041 Family history of malignant neoplasm of ovary: Secondary | ICD-10-CM

## 2024-05-06 DIAGNOSIS — G40909 Epilepsy, unspecified, not intractable, without status epilepticus: Secondary | ICD-10-CM | POA: Diagnosis not present

## 2024-05-06 DIAGNOSIS — Z808 Family history of malignant neoplasm of other organs or systems: Secondary | ICD-10-CM

## 2024-05-06 LAB — CBC
HCT: 43.1 % (ref 36.0–46.0)
Hemoglobin: 14.1 g/dL (ref 12.0–15.0)
MCH: 26.2 pg (ref 26.0–34.0)
MCHC: 32.7 g/dL (ref 30.0–36.0)
MCV: 80 fL (ref 80.0–100.0)
Platelets: 218 K/uL (ref 150–400)
RBC: 5.39 MIL/uL — ABNORMAL HIGH (ref 3.87–5.11)
RDW: 14 % (ref 11.5–15.5)
WBC: 8 K/uL (ref 4.0–10.5)
nRBC: 0 % (ref 0.0–0.2)

## 2024-05-06 LAB — I-STAT CHEM 8, ED
BUN: 16 mg/dL (ref 8–23)
Calcium, Ion: 1.12 mmol/L — ABNORMAL LOW (ref 1.15–1.40)
Chloride: 101 mmol/L (ref 98–111)
Creatinine, Ser: 0.6 mg/dL (ref 0.44–1.00)
Glucose, Bld: 159 mg/dL — ABNORMAL HIGH (ref 70–99)
HCT: 41 % (ref 36.0–46.0)
Hemoglobin: 13.9 g/dL (ref 12.0–15.0)
Potassium: 4 mmol/L (ref 3.5–5.1)
Sodium: 138 mmol/L (ref 135–145)
TCO2: 24 mmol/L (ref 22–32)

## 2024-05-06 LAB — DIFFERENTIAL
Abs Immature Granulocytes: 0.04 K/uL (ref 0.00–0.07)
Basophils Absolute: 0 K/uL (ref 0.0–0.1)
Basophils Relative: 1 %
Eosinophils Absolute: 0.3 K/uL (ref 0.0–0.5)
Eosinophils Relative: 4 %
Immature Granulocytes: 1 %
Lymphocytes Relative: 14 %
Lymphs Abs: 1.1 K/uL (ref 0.7–4.0)
Monocytes Absolute: 0.7 K/uL (ref 0.1–1.0)
Monocytes Relative: 9 %
Neutro Abs: 5.8 K/uL (ref 1.7–7.7)
Neutrophils Relative %: 71 %

## 2024-05-06 LAB — APTT: aPTT: 38 s — ABNORMAL HIGH (ref 24–36)

## 2024-05-06 LAB — COMPREHENSIVE METABOLIC PANEL WITH GFR
ALT: 15 U/L (ref 0–44)
AST: 21 U/L (ref 15–41)
Albumin: 3.7 g/dL (ref 3.5–5.0)
Alkaline Phosphatase: 56 U/L (ref 38–126)
Anion gap: 12 (ref 5–15)
BUN: 15 mg/dL (ref 8–23)
CO2: 23 mmol/L (ref 22–32)
Calcium: 9.2 mg/dL (ref 8.9–10.3)
Chloride: 102 mmol/L (ref 98–111)
Creatinine, Ser: 0.68 mg/dL (ref 0.44–1.00)
GFR, Estimated: >60 mL/min (ref >60)
Glucose, Bld: 157 mg/dL — ABNORMAL HIGH (ref 70–99)
Potassium: 4 mmol/L (ref 3.5–5.1)
Sodium: 137 mmol/L (ref 135–145)
Total Bilirubin: 0.7 mg/dL (ref 0.0–1.2)
Total Protein: 6.7 g/dL (ref 6.5–8.1)

## 2024-05-06 LAB — CBG MONITORING, ED: Glucose-Capillary: 142 mg/dL — ABNORMAL HIGH (ref 70–99)

## 2024-05-06 LAB — PROTIME-INR
INR: 0.9 (ref 0.8–1.2)
Prothrombin Time: 13.1 s (ref 11.4–15.2)

## 2024-05-06 LAB — ETHANOL: Alcohol, Ethyl (B): <15 mg/dL (ref <15)

## 2024-05-06 MED ORDER — LEVETIRACETAM 500 MG PO TABS: 1000.0000 mg | ORAL_TABLET | Freq: Two times a day (BID) | ORAL | Status: DC

## 2024-05-06 MED ORDER — SODIUM CHLORIDE 0.9 % IV SOLN
20.0000 mg/kg | Freq: Once | INTRAVENOUS | Status: AC
  Administered 2024-05-06: 1360 mg via INTRAVENOUS
  Filled 2024-05-06: qty 27.2

## 2024-05-06 MED ORDER — SODIUM CHLORIDE 0.9 % IV SOLN
50.0000 mg | Freq: Two times a day (BID) | INTRAVENOUS | Status: DC
  Administered 2024-05-06: 50 mg via INTRAVENOUS
  Filled 2024-05-06 (×2): qty 5

## 2024-05-06 MED ORDER — DEXAMETHASONE 4 MG PO TABS
2.0000 mg | ORAL_TABLET | Freq: Every day | ORAL | Status: DC
Start: 2024-05-07 — End: 2024-05-07

## 2024-05-06 MED ORDER — SODIUM CHLORIDE 0.9 % IV SOLN
100.0000 mg | Freq: Two times a day (BID) | INTRAVENOUS | Status: DC
  Administered 2024-05-06 – 2024-05-09 (×6): 100 mg via INTRAVENOUS
  Filled 2024-05-06 (×7): qty 10

## 2024-05-06 MED ORDER — LORAZEPAM 2 MG/ML IJ SOLN
0.5000 mg | Freq: Once | INTRAMUSCULAR | Status: AC
Start: 2024-05-06 — End: 2024-05-06
  Administered 2024-05-06: 0.5 mg via INTRAVENOUS
  Filled 2024-05-06: qty 1

## 2024-05-06 MED ORDER — LEVETIRACETAM (KEPPRA) 500 MG/5 ML ADULT IV PUSH
4000.0000 mg | Freq: Once | INTRAVENOUS | Status: AC
  Administered 2024-05-06: 4000 mg via INTRAVENOUS
  Filled 2024-05-06: qty 40

## 2024-05-06 MED ORDER — LORAZEPAM 2 MG/ML IJ SOLN
INTRAMUSCULAR | Status: AC
  Filled 2024-05-06: qty 1

## 2024-05-06 MED ORDER — LEVOTHYROXINE SODIUM 88 MCG PO TABS: 88.0000 ug | ORAL_TABLET | Freq: Every day | ORAL | Status: DC

## 2024-05-06 MED ORDER — LORAZEPAM 2 MG/ML IJ SOLN
INTRAMUSCULAR | Status: AC
  Administered 2024-05-06: 2 mg via INTRAVENOUS
  Filled 2024-05-06: qty 1

## 2024-05-06 MED ORDER — ROSUVASTATIN CALCIUM 5 MG PO TABS
5.0000 mg | ORAL_TABLET | Freq: Every day | ORAL | Status: DC
Start: 2024-05-06 — End: 2024-05-07

## 2024-05-06 MED ORDER — LORAZEPAM 2 MG/ML IJ SOLN
2.0000 mg | Freq: Once | INTRAMUSCULAR | Status: AC
  Administered 2024-05-06: 2 mg via INTRAVENOUS

## 2024-05-06 MED ORDER — DEXAMETHASONE SODIUM PHOSPHATE 10 MG/ML IJ SOLN
10.0000 mg | INTRAMUSCULAR | Status: AC
  Administered 2024-05-06: 10 mg via INTRAVENOUS
  Filled 2024-05-06: qty 1

## 2024-05-06 MED ORDER — METOPROLOL SUCCINATE ER 100 MG PO TB24
100.0000 mg | ORAL_TABLET | Freq: Every day | ORAL | Status: DC
Start: 2024-05-06 — End: 2024-05-07

## 2024-05-06 MED ORDER — LEVETIRACETAM (KEPPRA) 500 MG/5 ML ADULT IV PUSH
1000.0000 mg | Freq: Two times a day (BID) | INTRAVENOUS | Status: DC
Start: 2024-05-06 — End: 2024-05-09
  Administered 2024-05-06 – 2024-05-09 (×6): 1000 mg via INTRAVENOUS
  Filled 2024-05-06 (×6): qty 10

## 2024-05-06 MED ORDER — LACOSAMIDE 50 MG PO TABS: 50.0000 mg | ORAL_TABLET | Freq: Two times a day (BID) | ORAL | Status: DC

## 2024-05-06 NOTE — H&P (Addendum)
 History and Physical    Patient: Yvonne [name] FMW:[id number] DOB: May 20, 1946 DOA: 05/06/2024 DOS: the patient was seen and examined on 05/06/2024 PCP: Cleotilde Planas, MD  Patient coming from: Home  Chief Complaint:  Chief Complaint  Patient presents with   Seizures   Facial Droop   HPI: Yvonne [name] is a 78 y.o. 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  The patient is unable to provide medical history. HPI obtained from the son and daughter, who are at the bedside. From what I can gather, the pt was eating breakfast with her son, when he noticed a sudden drooping of her face. Given her known brain cancer, he exited the restaurant with his mother, who was able to walk with assistance, and presented to the ED. It was noted by the daughter that the patient had a prior history of seizures, which typically presented with aphasia, but not with facial drooping or weakness.   In the ED, pt hypertensive. Labs unremarkable. CTH showed mixed attenuation mass in the left parietal lobe, increased in size since the previous CT from approximately 2.6 x 2.1 cm to approximately 3.9 x 3.4 cm, now denser along its periphery with a smaller necrotic central core, and postsurgical encephalomalacia changes within the left temporal lobe. EDP consulted Neurology, who recommended 4 mg IV ativan , load of 4,000mg  IV Keppra  and load of 1360mg  IV fosphenytoin  thus far,  palliative care and requested medicine admission.   Review of Systems: As mentioned in the history of present illness. All other systems reviewed and are negative. Past Medical History:  Diagnosis Date   Anxiety    Chest pressure    Degenerative arthritis    Deviated septum    Diabetes mellitus without complication (HCC)    diet controlled   Diabetic neuropathy (HCC) 12/03/2017   DJD (degenerative joint disease) of knee    Family history of bladder cancer    Family history of brain cancer    Family history of breast cancer    Family history of ovarian cancer    Family history of prostate cancer    Family history of stomach cancer    Family history of throat cancer    Family history of uterine cancer    Graves disease    RADIOACTIVE IODINE 1983   H/O hiatal hernia    Heart palpitations    History of diastolic dysfunction    per echo in 2011   Hyperlipidemia    Hypertension    Hypothyroidism    LVH (left ventricular hypertrophy)    per echo in 2011   Mitral valve prolapse    Neuropathy, peripheral    DR. JENEL 12/15   Normal nuclear stress test 2011   OSA (obstructive sleep apnea)    Polyneuropathy in other diseases classified elsewhere 05/21/2013   Rosacea 2019   MELANOMA CHEST REMOVED IN SITU   Sleep difficulties    Tinnitus    Past Surgical History:  Procedure Laterality Date   ABDOMINAL HYSTERECTOMY  2000   TOTAL   APPENDECTOMY  1989   Brain biospy     CARDIOVASCULAR STRESS TEST  11/09/2009   EF 75%   CARPAL TUNNEL RELEASE  2002   bilateral   COLONOSCOPY WITH PROPOFOL  N/A 10/23/2018   Procedure: COLONOSCOPY WITH PROPOFOL ;  Surgeon: Teressa Toribio SQUIBB, MD;  Location: [facility];  Service: Endoscopy;  Laterality: N/A;   crainectomy     cranectomy     ESOPHAGOGASTRODUODENOSCOPY N/A 11/16/2021   Procedure: ESOPHAGOGASTRODUODENOSCOPY (EGD);  Surgeon: Teressa Toribio SQUIBB, MD;  Location: [facility];  Service: Endoscopy;  Laterality: N/A;    ESOPHAGOGASTRODUODENOSCOPY (EGD) WITH PROPOFOL  N/A 10/23/2018   Procedure: ESOPHAGOGASTRODUODENOSCOPY (EGD) WITH PROPOFOL ;  Surgeon: Teressa Toribio SQUIBB, MD;  Location: [facility];  Service: Endoscopy;  Laterality: N/A;   EUS N/A 10/23/2018   Procedure: UPPER ENDOSCOPIC ULTRASOUND (EUS) RADIAL;  Surgeon: Teressa Toribio SQUIBB, MD;  Location: [facility];  Service: Endoscopy;  Laterality: N/A;   EUS N/A 11/16/2021   Procedure: UPPER ENDOSCOPIC ULTRASOUND (EUS) RADIAL;  Surgeon: Teressa Toribio SQUIBB, MD;  Location: [facility];  Service: Endoscopy;  Laterality: N/A;   MASS EXCISION Left 06/02/2019   Procedure: EXCISION OF SUBCUTANEOUS ABDOMINAL WALL MASSES LEFT LOWER QUADRANT;  Surgeon: Belinda Cough, MD;  Location: [facility];  Service: General;  Laterality: Left;   TONSILLECTOMY  1958   TRANSTHORACIC ECHOCARDIOGRAM  11/01/2009   NORMAL LV FILLING AND DIASTOLIC DYSFUNCTION AND MILD AORTIC SCLEROSIS AND TRACE MITRAL REGURGITATION   Social History:  reports that she quit smoking about 48 years ago. Her smoking use included cigarettes. She started smoking about 58 years ago. She has a 5 pack-year smoking history. She has never used smokeless tobacco. She reports that she does not currently use alcohol. She reports that she does not use drugs.  Allergies  Allergen Reactions   Tobramycin Hives   Sulfa Antibiotics Other (See Comments)    Stomach upset   Erythromycin Nausea Only    Family History  Problem Relation Age of Onset   Hypertension Mother    Heart disease Mother    Dementia Mother    Heart disease Father    Arthritis Father    Diabetes Father    Hypertension Father    Pancreatic cancer Brother 30       pancreatic   Other Brother        automobile accident   Throat cancer Maternal Uncle    Brain cancer Paternal Aunt 64       malignant brain cancer   Stroke Maternal Grandmother    Heart disease Maternal Grandfather    Pancreatic cancer Cousin 10       pancreatic (maternal first cousin)    Pancreatic cancer Cousin 43       pancreatic, smoker (maternal first cousin)   Breast cancer Cousin 13       breast (maternal first cousin)   Ovarian cancer Cousin    Uterine cancer Cousin 63       uterine (paternal first cousin)   Cancer Cousin 76       unknown primary (paternal first cousin)   Bladder Cancer Cousin 90       (paternal first cousin)   Lung cancer Cousin    Throat cancer Cousin    Prostate cancer Cousin        (maternal first cousin)   Stomach cancer Cousin 11       (maternal first cousin)   Cancer Other 54       pancreatic neuroendocrine carcinoma   Colon cancer Neg Hx  Neuropathy Neg Hx     Prior to Admission medications   Medication Sig Start Date End Date Taking? Authorizing Provider  acetaminophen  (TYLENOL ) 500 MG tablet Take 1,000 mg by mouth every 8 (eight) hours as needed for moderate pain.    [provider]  Cholecalciferol  (VITAMIN D ) 50 MCG (2000 UT) tablet Take 2,000 Units by mouth daily.     [provider]  dexamethasone  (DECADRON ) 2 MG tablet Take 2 mg by mouth daily. 02/21/24   [provider]  dexamethasone  (DECADRON ) 4 MG tablet Take 1 tablet (4 mg total) by mouth daily. 12/30/23   Vaslow, Zachary K, MD  fluticasone  (FLONASE ) 50 MCG/ACT nasal spray Place 2 sprays into both nostrils at bedtime.    [provider]  furosemide  (LASIX ) 20 MG tablet Take 10 mg by mouth as needed for edema.    [provider]  gabapentin  (NEURONTIN ) 300 MG capsule Take 300 mg by mouth at bedtime.     Cleotilde Planas, MD  JARDIANCE 10 MG TABS tablet 1 tablet Orally Once a day 10/05/22   [provider]  lacosamide  (VIMPAT ) 50 MG TABS tablet Take 1 tablet (50 mg total) by mouth 2 (two) times daily. 03/10/24   Vaslow, Zachary K, MD  levothyroxine  (SYNTHROID ) 88 MCG tablet Take 88 mcg by mouth daily before breakfast.    [provider]  metFORMIN (GLUCOPHAGE) 500 MG tablet Take 500 mg by mouth 2 (two) times daily.  09/05/22   [provider]  metoprolol  succinate (TOPROL -XL) 100 MG 24 hr tablet Take 100 mg by mouth daily. 08/21/22   [provider]  metroNIDAZOLE  (METROCREAM ) 0.75 % cream Apply 1 application  topically daily as needed. 12/21/14   [provider]  mirtazapine (REMERON) 7.5 MG tablet Take 7.5 mg by mouth at bedtime. 05/05/24   [provider]  omeprazole (PRILOSEC) 40 MG capsule Take 40 mg by mouth daily. 03/05/24   [provider]  ondansetron  (ZOFRAN ) 8 MG tablet Take 1 tablet (8 mg total) by mouth every 8 (eight) hours as needed for nausea or vomiting. 10/31/23   Vaslow, Zachary K, MD  prochlorperazine  (COMPAZINE ) 10 MG tablet Take 1 tablet (10 mg total) by mouth every 6 (six) hours as needed for nausea or vomiting. 07/26/22   Vaslow, Zachary K, MD  rosuvastatin  (CRESTOR ) 5 MG tablet Take 5 mg by mouth daily.    [provider]    Physical Exam: Vitals:   05/06/24 0930 05/06/24 1100 05/06/24 1110 05/06/24 1120  BP: (!) 164/152 (!) 149/67 126/67 116/60  Pulse: 80 72 71 71  Resp: 18 20 18 17   SpO2: 100% 95% 95% 95%  Weight:      Height:       Respiratory: Lungs clear to auscultation bilaterally with normal respiratory effort; no w/r/r Cardiovascular: Regular rate and rhythm w/o m/r/g Neurologic: Sedated and intermittently opens eyes while EEG probes are being applied Psychiatric: Appropriate mood and affect, conversational and cooperative   Data Reviewed:  Lab Results  Component Value Date   WBC 8.0 05/06/2024   HGB 13.9 05/06/2024   HCT 41.0 05/06/2024   MCV 80.0 05/06/2024   PLT 218 05/06/2024   Lab Results  Component Value Date   GLUCOSE 159 (H) 05/06/2024   CALCIUM  9.2 05/06/2024   NA 138 05/06/2024   K 4.0 05/06/2024   CO2 23 05/06/2024   CL 101 05/06/2024   BUN 16 05/06/2024   CREATININE 0.60 05/06/2024   Lab Results  Component Value Date   ALT 15 05/06/2024   AST 21 05/06/2024   ALKPHOS 56 05/06/2024    BILITOT 0.7 05/06/2024   Lab Results  Component Value Date   INR 0.9 05/06/2024   INR 0.9 03/06/2024   INR 1.1 04/04/2022   Radiology: CT HEAD CODE STROKE WO CONTRAST Result Date: 05/06/2024 EXAM: CT HEAD WITHOUT CONTRAST 05/06/2024 09:34:40 AM TECHNIQUE: CT of the head was performed without the administration of intravenous contrast. Automated exposure control, iterative reconstruction, and/or weight based adjustment of the mA/kV was utilized to reduce the radiation dose to as low as reasonably achievable. COMPARISON: CT of the head dated 03/23/2024 and MRI of the brain dated 04/22/2024. CLINICAL HISTORY: Neuro deficit, acute, stroke suspected. RIGHT SIDE FACIAL DROOP; SLURRED SPEECH; LKW 0850; Neuro- Kirkpatrick. FINDINGS: BRAIN AND VENTRICLES: No acute hemorrhage. No evidence of acute infarct. No hydrocephalus. No extra-axial collection. No mass effect or midline shift. Postsurgical encephalomalacia changes are again demonstrated within the left temporal lobe. There is a mixed attenuation mass present within the left parietal lobe which has increased in size since the previous CT from approximately 2.6 x 2.1 cm to approximately 3.9 x 3.4 cm. The mass is now denser along its periphery with a smaller necrotic central core. [place] stroke program early CT (ASPECT) score: Ganglionic (caudate, ic, Lentiform Nucleus, insula, M1-m3): 7. Supraganglionic (m4-m6): 3. Total: 10. ORBITS: No acute abnormality. SINUSES: No acute abnormality. SOFT TISSUES AND SKULL: No acute soft tissue abnormality. No skull fracture. The above findings were communicated to Dr. Michaela at 09:40 AM 05/06/2024 via Amion. IMPRESSION: 1. Mixed attenuation mass in the left parietal lobe, increased in size since the previous CT from approximately 2.6 x 2.1 cm to approximately 3.9 x 3.4 cm, now denser along its periphery with a smaller necrotic central core. 2. Postsurgical encephalomalacia changes within the left temporal lobe. 3.  ASPECT score: 10. 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  Seizure Facial droop LE weakness H/o GBM -Neurology consulted; apprec eval/recs -Palliative care consulted; apprec eval/recs -Oncology consulted; apprec eval/recs -PTA Vimpat  50mg  BID -PTA dexamethasone  2mg  daily -Seizure precautions  Hypothyroidism -PTA synthroid  once able to tol PO  HTN -PTA metoprolol  once able to tol PO  HLD -PTA Crestor  once able to tol PO    Advance Care Planning:   Code Status: Do not attempt resuscitation (DNR) PRE-ARREST INTERVENTIONS DESIRED   Consults: Palliative care and Neurology  Family Communication: Son and daughter  Severity of Illness: The appropriate patient status for this patient is INPATIENT. Inpatient status is judged to be reasonable and necessary in order to provide the required intensity of service to ensure the patient's safety. The patient's presenting symptoms, physical exam findings, and initial radiographic and laboratory data in the context of their chronic comorbidities is felt to place them at high risk for further clinical deterioration. Furthermore, it is not anticipated that the patient will be medically stable for discharge from the hospital within 2 midnights of  admission.   * I certify that at the point of admission it is my clinical judgment  that the patient will require inpatient hospital care spanning beyond 2 midnights from the point of admission due to high intensity of service, high risk for further deterioration and high frequency of surveillance required.*   ------- I spent 55 minutes reviewing previous notes, at the bedside counseling/discussing the treatment plan, and performing clinical documentation.  Author: Marsha Ada, MD 05/06/2024 12:52 PM  For on call review [email or web address].

## 2024-05-06 NOTE — ED Triage Notes (Signed)
 Pt POV from breakfast with family where family noticed obvious left facial droop. Pt has hx of glioblastoma x 2 years, seizures, and aphasia at baseline. On arrival at bridge pt noted to be foamy at mouth, twitching, gazing at right. Pt not on Keppra  at the moment.

## 2024-05-06 NOTE — Progress Notes (Signed)
 Dr. Aisha Seals, called the on-call at 6:06 p.m. to inform us  that the patient was admitted to the hospital with status epilepticus. The patient is now improved. He was loaded with Dilantin , and they plan to continue Vimpat  and Keppra  for seizure management.   This call was solely to update us  on the patient's status.  Jungyoung Kim DNP

## 2024-05-06 NOTE — Consult Note (Signed)
 Palliative Medicine Inpatient Consult Note  Consulting Provider: Levander Houston, MD   Reason for consult:   Palliative Care Consult Services Palliative Medicine Consult  Reason for Consult? glioblastoma recurrent   05/06/2024  HPI:  Per intake H&P --> Yvonne [name] is a 78 y.o. female with past medical hx of glioglastoma, DM, graves disease, HTN, HLD. Yvonne [name] was brought from [facility] where she was eating breakfast with family and now complaining of what son in law reported as worsening aphasia and facial droop as well as R gaze.  Palliative care asked to support additional goals of care conversations.   Clinical Assessment/Goals of Care:  *Please note that this is a verbal dictation therefore any spelling or grammatical errors are due to the Dragon Medical One system interpretation.  I have reviewed medical records including EPIC notes, labs and imaging, received report from bedside RN, assessed the patient who is lying in bed with EEG monitoring in place.    I met with patients spouse, Yvonne [name], daughter, Yvonne [name], and son in law to further discuss diagnosis prognosis, GOC, EOL wishes, disposition and options.   I introduced Palliative Medicine as specialized medical care for people living with serious illness. It focuses on providing relief from the symptoms and stress of a serious illness. The goal is to improve quality of life for both the patient and the family.  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 She has been on clinical study at [facility], [facility]. Recently  underwent infusion with atezolizumab and avastin on 04/22/24.   Social History:  Yvonne [name] lives in the home with her husband of more than 41 years. Yvonne [name] is her only biological child. 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 Patient also has 2 stepchildren and 2 grandchildren. She is a retired Programmer, systems. Enjoyed playing tennis. Christian faith.   Functional and Nutritional State:  Prior to admission, Yvonne [name] was fully able to attend to bADLs and recently attended a volleyball game.   Advance Directives:  A detailed discussion was had today regarding advanced directives.  Patient has created advanced directives.   Code Status:  Concepts specific to code status, artifical feeding and hydration, continued IV antibiotics and rehospitalization was had.  The difference between a aggressive medical intervention path  and a palliative comfort care path for this patient at this time was had.   As of presently patient is a DNAR, her daughter is struggling with whether or not she would desire intubation if it allowed more time to control seizures. She continues to consider this.   Discussion:  We discussed Palliative care and role in the hospital setting. We reviewed at this time the goal is to discuss where Yvonne [name] is at in her disease and better determine the best path moving forward.   We reviewed the long and complicated path that Yvonne [name] has been on since her glioblastoma diagnosis in 2023.   Yvonne [name] shares that she has recently spoken to Dr. Buckley and the determination at that time is that Yvonne [name] has limited time left < 3 months likely. She however did want to continue treatment(s) as offered inclusive of CCNU and avastin.   Patients daughter, spouse, and  SIL all note their respect and trust in Dr. Buckley. They are hopeful to hear his recommendations on the best course of action for Yvonne [name]'s current disease burden.   We reviewed best case and worst case scenarios in the setting of Yvonne [name]'s current disease burden,  best case being control of the seizures and improvement in LOC so patient could maintain quality in her life and allow meaningful interaction. Worst case being she continues to decline despite present intervention(s) and ends up requiring increased antiepileptics. We discussed if the latter is that case the need to determine if a comfort path is the best option.  We discussed that at end of life Yvonne [name] has expressed the desire to be home. I shared this would be something which could be pursued with hospice.   Plan to allow present measures and for further conversations with Dr. Buckley.   Discussed the importance of continued conversation with family and their  medical providers regarding overall plan of care and treatment options, ensuring decisions are within the context of the patients values and GOCs.  Decision Maker: Yvonne [name] (Daughter): [phone] ([place])   SUMMARY OF RECOMMENDATIONS   DNAR  Allow time for outcomes  Patients family would like to hear from Dr. Buckley in regards to his recommendations moving forward  Best case and worst case scenarios reviewed  Palliative care and Hospice care discussed  Ongoing PMT support  Code Status/Advance Care Planning: DNAR  Palliative Prophylaxis:  Aspiration, Bowel Regimen, Delirium Protocol, Frequent Pain Assessment, Oral Care, Palliative Wound Care, and Turn Reposition  Additional Recommendations (Limitations, Scope, Preferences): Continue present care  Psycho-social/Spiritual:  Desire for further Chaplaincy support: Not presently.  Additional Recommendations: Discussion related to glioblastoma and end stage disease   Prognosis: Limited.   Discharge Planning: To be determined.   Vitals:   05/06/24 1110 05/06/24 1120  BP: 126/67 116/60  Pulse: 71 71  Resp: 18 17  SpO2: 95% 95%    Intake/Output Summary (Last 24 hours) at 05/06/2024 1210 Last data filed at 05/06/2024 1149 Gross per 24 hour  Intake 50 ml  Output  --  Net 50 ml   Last Weight  Most recent update: 05/06/2024  9:31 AM    Weight  68 kg (149 lb 14.6 oz)             LABS: CBC:    Component Value Date/Time   WBC 8.0 05/06/2024 0923   HGB 13.9 05/06/2024 0930   HGB 14.2 12/30/2023 1127   HCT 41.0 05/06/2024 0930   PLT 218 05/06/2024 0923   PLT 112 (L) 12/30/2023 1127   MCV 80.0 05/06/2024 0923   NEUTROABS 5.8 05/06/2024 0923   LYMPHSABS 1.1 05/06/2024 0923   MONOABS 0.7 05/06/2024 0923   EOSABS 0.3 05/06/2024 0923   BASOSABS 0.0 05/06/2024 0923   Comprehensive Metabolic Panel:    Component Value Date/Time   NA 138 05/06/2024 0930   K 4.0 05/06/2024 0930   CL 101 05/06/2024 0930   CO2 23 05/06/2024 0923   BUN 16 05/06/2024 0930   CREATININE 0.60 05/06/2024 0930   CREATININE 0.76 12/30/2023 1127   CREATININE 0.71 01/04/2016 0739   GLUCOSE 159 (H) 05/06/2024 0930   CALCIUM  9.2 05/06/2024 0923   AST 21 05/06/2024 0923   AST 13 (L) 12/30/2023 1127   ALT 15 05/06/2024 0923   ALT 15 12/30/2023 1127   ALKPHOS 56 05/06/2024 0923   BILITOT 0.7 05/06/2024 0923   BILITOT 0.5 12/30/2023 1127   PROT 6.7 05/06/2024  0923   PROT 6.7 10/03/2021 0918   ALBUMIN 3.7 05/06/2024 9076   Gen:  Elderly Caucasian F chronically ill appearing HEENT: moist mucous membranes CV: Regular rate and rhythm  PULM:  Breathing is nonlabored ABD: soft/nontender  EXT: No edema  Neuro:  somnolent  PPS: 10-20%   This conversation/these recommendations were discussed with patient primary care team, Dr. Georgina ______________________________________________________ [facility] Palliative Medicine Team Team Cell Phone: [phone] Please utilize secure chat with additional questions, if there is no response within 30 minutes please call the above phone number  Total Time: 75 Billing based on MDM: High  Palliative Medicine Team providers are available by phone from 7am to 7pm daily and can be reached through the team cell  phone.  Should this patient require assistance outside of these hours, please call the patient's attending physician.

## 2024-05-06 NOTE — Progress Notes (Signed)
 Routine EEG complete. Results pending.

## 2024-05-06 NOTE — ED Notes (Addendum)
 Per verbal order from MD Michaela, Cerebryx drip to run over 30 minutes and check vital signs every 10 minutes. MD says decrease rate if patient's BP drops.

## 2024-05-06 NOTE — Procedures (Signed)
 Patient Name: Yvonne [name]  MRN: [id number]  Epilepsy Attending: Arlin MALVA Krebs  Referring Physician/Provider: Michaela Aisha SQUIBB, MD  Date: 05/06/2024 Duration: 23.14 mins  Patient history: 78 y.o. female hx of anxiety, left glioblastoma who presents for acute onset of right sided facial droop, and slurred speech. Patient appears to be having a focal seizure with right gaze preference with eyes and facial twitching. EEG to evaluate for seizure.  Level of alertness: lethargic, asleep  AEDs during EEG study: LEV, LCM, PHT, ativan   Technical aspects: This EEG study was done with scalp electrodes positioned according to the 10-20 International system of electrode placement. Electrical activity was reviewed with band pass filter of 1-70Hz , sensitivity of 7 uV/mm, display speed of 43mm/sec with a 60Hz  notched filter applied as appropriate. EEG data were recorded continuously and digitally stored.  Video monitoring was available and reviewed as appropriate.  Description: EEG showed continuous generalized and lateralized left hemisphere 3 to 6 Hz theta-delta slowing admixed with 15 to 18 Hz beta activity distributed symmetrically and diffusely. Sleep was characterized by sleep spindles (12 to 14 Hz), maximal frontocentral region. Hyperventilation and photic stimulation were not performed.     ABNORMALITY - Continuous slow, generalized  and lateralized left hemisphere  IMPRESSION: This study is suggestive of cortical dysfunction arising from left hemisphere likely secondary to underlying structural abnormality/ tumor. Additionally there is moderate diffuse encephalopathy. No seizures or epileptiform discharges were seen throughout the recording.  Of note, focal motor seizures may not seen on scalp EEG. Clinical correlation is recommended.   Zoraida Havrilla O Tyshae Stair

## 2024-05-06 NOTE — ED Notes (Signed)
 EEG @ bedside.

## 2024-05-06 NOTE — Progress Notes (Signed)
 vLTM setup  all impedances below 10kohm.  ED pt [facility] not monitoring pt at this time

## 2024-05-06 NOTE — Consult Note (Signed)
 NEUROLOGY CONSULT NOTE   Date of service: May 06, 2024 Patient Name: Yvonne [name] MRN:  [id number] DOB:  1945/10/28 Chief Complaint: code stroke  Requesting Provider: Levander Houston, MD  History of Present Illness  Yvonne [name] is a 78 y.o. female with hx of anxiety, left glioblastoma on Vimpat  50mg  BID at home, DM< hx of graves disease, HTN, HLD, hypothyroidism, diabetic neuropathy, osa, who presents to [facility] via private vehicle for acute onset of right sided facial droop, and slurred speech. Per family, she was at breakfast, walked into the restaurant and was in her normal state of health (has baseline speech difficulties) when she suddenly started having right facial twitching, right gaze  and drooling from her mouth. Code stroke was activated on arrival. She was given a total of 4 mg IV ativan . Ct head with mixed attenuation mass in the left parietal lobe, increased in size since the previous CT from approximately 2.6 x 2.1 cm to approximately 3.9 x 3.4 cm, now denser along its periphery with a smaller necrotic central core. She was given 4,000 mg IV Keppra  x 1 as well as 1360 mg IV of fosphenytoin . Daughter confirms patient has been taking her Vimpat  as scheduled and has not missed any doses. No recent illness Code stroke was cancelled by Dr. Michaela COZIER: [phone] Modified rankin score: 2-Slight disability-UNABLE to perform all activities but does not need assistance IV Thrombolysis:  No not acute stroke EVT: No LVO  NIHSS components Score: Comment  1a Level of Conscious 0[x]  1[]  2[]  3[]      1b LOC Questions 0[]  1[]  2[x]       1c LOC Commands 0[x]  1[]  2[]       2 Best Gaze 0[]  1[x]  2[]       3 Visual 0[x]  1[]  2[]  3[]      4 Facial Palsy 0[x]  1[]  2[]  3[]      5a Motor Arm - left 0[x]  1[]  2[]  3[]  4[]  UN[]    5b Motor Arm - Right 0[]  1[x]  2[]  3[]  4[]  UN[]    6a Motor Leg - Left 0[x]  1[]  2[]  3[]  4[]  UN[]    6b Motor Leg - Right 0[x]  1[]  2[]  3[]  4[]  UN[]    7 Limb Ataxia 0[x]  1[]  2[]   UN[]      8 Sensory 0[x]  1[]  2[]  UN[]      9 Best Language 0[]  1[]  2[x]  3[]      10 Dysarthria 0[]  1[]  2[x]  UN[]      11 Extinct. and Inattention 0[x]  1[]  2[]       TOTAL: 8      ROS   Comprehensive ROS Unable to ascertain due to aphasia   Past History   Past Medical History:  Diagnosis Date   Anxiety    Chest pressure    Degenerative arthritis    Deviated septum    Diabetes mellitus without complication (HCC)    diet controlled   Diabetic neuropathy (HCC) 12/03/2017   DJD (degenerative joint disease) of knee    Family history of bladder cancer    Family history of brain cancer    Family history of breast cancer    Family history of ovarian cancer    Family history of prostate cancer    Family history of stomach cancer    Family history of throat cancer    Family history of uterine cancer    Graves disease    RADIOACTIVE IODINE 1983   H/O hiatal hernia    Heart palpitations    History of diastolic dysfunction  per echo in 2011   Hyperlipidemia    Hypertension    Hypothyroidism    LVH (left ventricular hypertrophy)    per echo in 2011   Mitral valve prolapse    Neuropathy, peripheral    DR. JENEL 12/15   Normal nuclear stress test 2011   OSA (obstructive sleep apnea)    Polyneuropathy in other diseases classified elsewhere 05/21/2013   Rosacea 2019   MELANOMA CHEST REMOVED IN SITU   Sleep difficulties    Tinnitus     Past Surgical History:  Procedure Laterality Date   ABDOMINAL HYSTERECTOMY  2000   TOTAL   APPENDECTOMY  1989   Brain biospy     CARDIOVASCULAR STRESS TEST  11/09/2009   EF 75%   CARPAL TUNNEL RELEASE  2002   bilateral   COLONOSCOPY WITH PROPOFOL  N/A 10/23/2018   Procedure: COLONOSCOPY WITH PROPOFOL ;  Surgeon: Teressa Toribio SQUIBB, MD;  Location: [facility];  Service: Endoscopy;  Laterality: N/A;   crainectomy     cranectomy     ESOPHAGOGASTRODUODENOSCOPY N/A 11/16/2021   Procedure: ESOPHAGOGASTRODUODENOSCOPY (EGD);  Surgeon: Teressa Toribio SQUIBB, MD;  Location: [facility];  Service: Endoscopy;  Laterality: N/A;   ESOPHAGOGASTRODUODENOSCOPY (EGD) WITH PROPOFOL  N/A 10/23/2018   Procedure: ESOPHAGOGASTRODUODENOSCOPY (EGD) WITH PROPOFOL ;  Surgeon: Teressa Toribio SQUIBB, MD;  Location: [facility];  Service: Endoscopy;  Laterality: N/A;   EUS N/A 10/23/2018   Procedure: UPPER ENDOSCOPIC ULTRASOUND (EUS) RADIAL;  Surgeon: Teressa Toribio SQUIBB, MD;  Location: [facility];  Service: Endoscopy;  Laterality: N/A;   EUS N/A 11/16/2021   Procedure: UPPER ENDOSCOPIC ULTRASOUND (EUS) RADIAL;  Surgeon: Teressa Toribio SQUIBB, MD;  Location: [facility];  Service: Endoscopy;  Laterality: N/A;   MASS EXCISION Left 06/02/2019   Procedure: EXCISION OF SUBCUTANEOUS ABDOMINAL WALL MASSES LEFT LOWER QUADRANT;  Surgeon: Belinda Cough, MD;  Location: [facility];  Service: General;  Laterality: Left;   TONSILLECTOMY  1958   TRANSTHORACIC ECHOCARDIOGRAM  11/01/2009   NORMAL LV FILLING AND DIASTOLIC DYSFUNCTION AND MILD AORTIC SCLEROSIS AND TRACE MITRAL REGURGITATION    Family History: Family History  Problem Relation Age of Onset   Hypertension Mother    Heart disease Mother    Dementia Mother    Heart disease Father    Arthritis Father    Diabetes Father    Hypertension Father    Pancreatic cancer Brother 77       pancreatic   Other Brother        automobile accident   Throat cancer Maternal Uncle    Brain cancer Paternal Aunt 50       malignant brain cancer   Stroke Maternal Grandmother    Heart disease Maternal Grandfather    Pancreatic cancer Cousin 71       pancreatic (maternal first cousin)   Pancreatic cancer Cousin 1       pancreatic, smoker (maternal first cousin)   Breast cancer Cousin 24       breast (maternal first cousin)   Ovarian cancer Cousin    Uterine cancer Cousin 1       uterine (paternal first cousin)   Cancer Cousin 4       unknown primary (paternal first cousin)   Bladder Cancer Cousin 24       (paternal first cousin)    Lung cancer Cousin    Throat cancer Cousin    Prostate cancer Cousin        (maternal first cousin)   Stomach cancer  Cousin 38       (maternal first cousin)   Cancer Other 58       pancreatic neuroendocrine carcinoma   Colon cancer Neg Hx    Neuropathy Neg Hx     Social History  reports that she quit smoking about 48 years ago. Her smoking use included cigarettes. She started smoking about 58 years ago. She has a 5 pack-year smoking history. She has never used smokeless tobacco. She reports that she does not currently use alcohol. She reports that she does not use drugs.  Allergies  Allergen Reactions   Tobramycin Hives   Sulfa Antibiotics     Stomach upset   Erythromycin Nausea Only    Medications  No current facility-administered medications for this encounter.  Current Outpatient Medications:    acetaminophen  (TYLENOL ) 500 MG tablet, Take 1,000 mg by mouth every 8 (eight) hours as needed for moderate pain., Disp: , Rfl:    Cholecalciferol  (VITAMIN D ) 50 MCG (2000 UT) tablet, Take 2,000 Units by mouth daily. , Disp: , Rfl:    dexamethasone  (DECADRON ) 4 MG tablet, Take 1 tablet (4 mg total) by mouth daily., Disp: 30 tablet, Rfl: 1   fluticasone  (FLONASE ) 50 MCG/ACT nasal spray, Place 2 sprays into both nostrils at bedtime., Disp: , Rfl:    furosemide  (LASIX ) 20 MG tablet, Take 10 mg by mouth as needed for edema., Disp: , Rfl:    gabapentin  (NEURONTIN ) 300 MG capsule, Take 300 mg by mouth at bedtime. , Disp: , Rfl:    JARDIANCE 10 MG TABS tablet, 1 tablet Orally Once a day, Disp: , Rfl:    lacosamide  (VIMPAT ) 50 MG TABS tablet, Take 1 tablet (50 mg total) by mouth 2 (two) times daily., Disp: 60 tablet, Rfl: 3   levothyroxine  (SYNTHROID ) 88 MCG tablet, Take 88 mcg by mouth daily before breakfast., Disp: , Rfl:    metFORMIN (GLUCOPHAGE) 500 MG tablet, Take 500 mg by mouth 2 (two) times daily., Disp: , Rfl:    metoprolol  succinate (TOPROL -XL) 100 MG 24 hr tablet, Take 100 mg by  mouth daily., Disp: , Rfl:    metroNIDAZOLE  (METROCREAM ) 0.75 % cream, Apply 1 application  topically daily as needed., Disp: , Rfl:    ondansetron  (ZOFRAN ) 8 MG tablet, Take 1 tablet (8 mg total) by mouth every 8 (eight) hours as needed for nausea or vomiting., Disp: 30 tablet, Rfl: 1   prochlorperazine  (COMPAZINE ) 10 MG tablet, Take 1 tablet (10 mg total) by mouth every 6 (six) hours as needed for nausea or vomiting., Disp: 30 tablet, Rfl: 0   rosuvastatin  (CRESTOR ) 5 MG tablet, Take 5 mg by mouth daily., Disp: , Rfl:   Facility-Administered Medications Ordered in Other Encounters:    influenza vaccine adjuvanted (FLUAD) injection 0.5 mL, 0.5 mL, Intramuscular, Once, Vaslow, Zachary K, MD  Vitals   Vitals:   05/06/24 0900 05/06/24 0930  BP:  (!) 164/152  Pulse:  80  Resp:  18  SpO2:  100%  Weight: 68 kg   Height: 5' 4 (1.626 m)     Body mass index is 25.73 kg/m.   Physical Exam   Constitutional: Appears well-developed and well-nourished.   Psych: Affect appropriate to situation.   Eyes: No scleral injection.   HENT: No OP obstruction.   Head: Normocephalic.   Cardiovascular: Normal rate and regular rhythm.   Respiratory: Effort normal, non-labored breathing.   GI: Soft.  No distension. There is no tenderness.   Skin: WDI.  Neurologic Examination   Mental Status -  She is awake and alert, can follow commands. Garbled speech   Cranial Nerves II - XII - II - Visual field intact to blink to threat bilaterally  III, IV, VI - right gaze but can cross midline  V - Facial sensation intact bilaterally . VII - Facial movement intact bilaterally .twitching of right face and eye VIII - Hearing & vestibular intact bilaterally . X - Palate elevates symmetrically . XI - Chin turning & shoulder shrug intact bilaterally . XII - Tongue protrusion intact .  Motor Strength - The patient's strength was normal in left upper, and bilateral lowers, right upper with slight drift  Bulk  was normal and fasciculations were absent .   Motor Tone - Muscle tone was assessed at the neck and appendages and was normal . Sensory - symmetric bilaterally to light touch  Coordination - The patient had normal movements in the hands and feet with no ataxia or dysmetria.  Tremor was absent. Gait and Station - deferred.  Labs/Imaging/Neurodiagnostic studies   CBC:  Recent Labs  Lab 06/01/24 0923 06/01/2024 0930  WBC 8.0  --   NEUTROABS 5.8  --   HGB 14.1 13.9  HCT 43.1 41.0  MCV 80.0  --   PLT 218  --    Basic Metabolic Panel:  Lab Results  Component Value Date   NA 138 2024-06-01   K 4.0 06-01-2024   CO2 21 (L) 03/06/2024   GLUCOSE 159 (H) 2024-06-01   BUN 16 2024/06/01   CREATININE 0.60 06-01-24   CALCIUM  9.3 03/06/2024   GFRNONAA >60 03/06/2024   GFRAA >60 10/23/2019   Lipid Panel:  Lab Results  Component Value Date   LDLCALC 66 04/05/2022   HgbA1c:  Lab Results  Component Value Date   HGBA1C 7.6 (H) 06/28/2022   Urine Drug Screen:     Component Value Date/Time   LABOPIA NONE DETECTED 04/05/2022 1918   COCAINSCRNUR NONE DETECTED 04/05/2022 1918   LABBENZ NONE DETECTED 04/05/2022 1918   AMPHETMU NONE DETECTED 04/05/2022 1918   THCU NONE DETECTED 04/05/2022 1918   LABBARB NONE DETECTED 04/05/2022 1918    Alcohol Level     Component Value Date/Time   ETH <10 04/04/2022 1714   INR  Lab Results  Component Value Date   INR 0.9 Jun 01, 2024   APTT  Lab Results  Component Value Date   APTT 38 (H) 06-01-2024   AED levels:  Lab Results  Component Value Date   LEVETIRACETA <2.0 (L) 03/06/2024    CT Head without contrast(Personally reviewed): 1. Mixed attenuation mass in the left parietal lobe, increased in size since the previous CT from approximately 2.6 x 2.1 cm to approximately 3.9 x 3.4 cm, now denser along its periphery with a smaller necrotic central core. 2. Postsurgical encephalomalacia changes within the left temporal lobe. 3. ASPECT  score: 10.  ASSESSMENT   Yvonne [name] is a 78 y.o. female hx of anxiety, left glioblastoma (follows with Dr. Buckley and Dr. Esteban @ [facility]), DM, hx of graves disease, HTN, HLD, hypothyroidism, diabetic neuropathy, osa, who presents to [facility] via private vehicle for acute onset of right sided facial droop, and slurred speech. Patient appears to be having a focal seizure with right gaze preference with eyes and facial twitching . She has received 4 mg IV ativan , load of 4,000mg  IV Keppra  and load of 1360mg  IV fosphenytoin  thus far.   During the fosphenytoin  load, she did stop  seizing.  I had a long discussion with the patient and family regarding CODE STATUS, they are going to continue to think about it at this time and palliative care is going to visit with the patient and family.  In the setting of focal status epilepticus, I am very hesitant to proceed with aggressive measures such as intubation, and especially so in someone with a fairly poor prognosis.  Luckily, at this time it appears that she has stopped following fosphenytoin .  She is on lacosamide  at home, and therefore rather than combining lacosamide  and phenytoin  I will use Keppra  in addition to Vimpat  and increase the dose of the Vimpat .  RECOMMENDATIONS  Seizure precautions Continue Keppra  1g bid Continue Vimpat  100 mg twice daily ______________________________________________________________________   Signed, Karna DELENA Geralds, NP [facility]  I have seen the patient and reviewed the above note.  She presented in refractory partial status epilepticus manifesting as right facial twitching and language dysfunction.  Her mental status seemed relatively well preserved on arrival, but she was quite aphasic.  Following treatment with Ativan , Keppra , fosphenytoin , her mental status did decline, which I suspect is iatrogenic.  Luckily, there is no ongoing seizure on EEG.  I think palliative care is going to play a large role in  this hospitalization, as the patient has a very aggressive glioblastoma with a very poor prognosis though she is still receiving chemotherapy as part of a clinical trial.  Continue antiepileptics as above and continue EEG for now.  Neurology will follow.  This patient is critically ill and at significant risk of neurological worsening, death and care requires constant monitoring of vital signs, hemodynamics,respiratory and cardiac monitoring, neurological assessment, discussion with family, other specialists and medical decision making of high complexity. I spent 65 minutes of neurocritical care time  in the care of  this patient. This was time spent independent of any time provided by nurse practitioner or PA.  Aisha Seals, MD [facility]   If 7pm- 7am, please page neurology on call as listed in AMION. 05/06/2024  6:44 PM

## 2024-05-06 NOTE — ED Provider Notes (Signed)
 [place] EMERGENCY DEPARTMENT AT [facility] Provider Note   CSN: [id number] Arrival date & time: 05/06/24  9080  An emergency department physician performed an initial assessment on this suspected stroke patient at [phone].  Patient presents with: Seizures and Facial Droop   Yvonne [name] is a 78 y.o. female.   HPI 78 year old female presents today as code stroke.  Patient was with her son-in-law about 30 minutes prior to evaluation when she began having drooling and spasms of her face with asymmetry.  Son-in-law states that she has a word salad at baseline.  He reports that they were out to eat breakfast.  He was able to walk her with assistance from the restaurant to his truck and then brought her to the ED.  Here she was seen and initiated as code stroke.  I initially evaluated her at the bridge as a code stroke.  She had rhythmic movement of her face and gazing to the right consistent with seizure.  He was unable to cooperate with the remainder of the exam but was sitting upright with good tone in a wheelchair.    Prior to Admission medications   Medication Sig Start Date End Date Taking? Authorizing Provider  acetaminophen  (TYLENOL ) 500 MG tablet Take 1,000 mg by mouth every 8 (eight) hours as needed for moderate pain.    [provider]  Cholecalciferol  (VITAMIN D ) 50 MCG (2000 UT) tablet Take 2,000 Units by mouth daily.     [provider]  dexamethasone  (DECADRON ) 2 MG tablet Take 2 mg by mouth daily. 02/21/24   [provider]  dexamethasone  (DECADRON ) 4 MG tablet Take 1 tablet (4 mg total) by mouth daily. 12/30/23   Vaslow, Zachary K, MD  fluticasone  (FLONASE ) 50 MCG/ACT nasal spray Place 2 sprays into both nostrils at bedtime.    [provider]  furosemide  (LASIX ) 20 MG tablet Take 10 mg by mouth as needed for edema.    [provider]  gabapentin  (NEURONTIN ) 300 MG capsule Take 300 mg by mouth at bedtime.     Cleotilde Planas, MD  JARDIANCE 10 MG TABS tablet 1 tablet Orally Once a day 10/05/22   [provider]  lacosamide  (VIMPAT ) 50 MG TABS tablet Take 1 tablet (50 mg total) by mouth 2 (two) times daily. 03/10/24   Vaslow, Zachary K, MD  levothyroxine  (SYNTHROID ) 88 MCG tablet Take 88 mcg by mouth daily before breakfast.    [provider]  metFORMIN (GLUCOPHAGE) 500 MG tablet Take 500 mg by mouth 2 (two) times daily. 09/05/22   [provider]  metoprolol  succinate (TOPROL -XL) 100 MG 24 hr tablet Take 100 mg by mouth daily. 08/21/22   [provider]  metroNIDAZOLE  (METROCREAM ) 0.75 % cream Apply 1 application  topically daily as needed. 12/21/14   [provider]  mirtazapine (REMERON) 7.5 MG tablet Take 7.5 mg by mouth at bedtime. 05/05/24   [provider]  omeprazole (PRILOSEC) 40 MG capsule Take 40 mg by mouth daily. 03/05/24   [provider]  ondansetron  (ZOFRAN ) 8 MG tablet Take 1 tablet (8 mg total) by mouth every 8 (eight) hours as needed for nausea or vomiting. 10/31/23   Vaslow, Zachary K, MD  prochlorperazine  (COMPAZINE ) 10 MG tablet Take 1 tablet (10 mg total) by mouth every 6 (six) hours as needed for nausea or vomiting. 07/26/22   Vaslow, Zachary K, MD  rosuvastatin  (CRESTOR ) 5 MG tablet Take 5 mg by mouth daily.  [provider]    Allergies: Tobramycin, Sulfa antibiotics, and Erythromycin    Review of Systems  Updated Vital Signs BP 116/60   Pulse 71   Resp 17   Ht 1.626 m (5' 4)   Wt 68 kg   SpO2 95%   BMI 25.73 kg/m   Physical Exam Vitals reviewed.  HENT:     Head: Normocephalic.     Right Ear: External ear normal.     Left Ear: External ear normal.     Nose: Nose normal.     Mouth/Throat:     Pharynx: Oropharynx is clear.  Eyes:     Pupils: Pupils are equal, round, and reactive to light.  Cardiovascular:     Rate and Rhythm: Normal rate and regular rhythm.     Pulses: Normal pulses.     Heart sounds:  Normal heart sounds.  Pulmonary:     Effort: Pulmonary effort is normal.  Abdominal:     General: Abdomen is flat. Bowel sounds are normal.  Musculoskeletal:        General: Normal range of motion.     Cervical back: Normal range of motion.  Skin:    General: Skin is warm.     Capillary Refill: Capillary refill takes less than 2 seconds.  Neurological:     Mental Status: She is alert.     Comments: Facial asymmetry with left facial rhythmic movement Patient unable to follow instructions right upper extremity is not completely flaccid as remainder of extremities are.  There appears to be some rigidity in the right upper extremity  Psychiatric:        Mood and Affect: Mood normal.     (all labs ordered are listed, but only abnormal results are displayed) Labs Reviewed  APTT - Abnormal; Notable for the following components:      Result Value   aPTT 38 (*)    All other components within normal limits  CBC - Abnormal; Notable for the following components:   RBC 5.39 (*)    All other components within normal limits  COMPREHENSIVE METABOLIC PANEL WITH GFR - Abnormal; Notable for the following components:   Glucose, Bld 157 (*)    All other components within normal limits  I-STAT CHEM 8, ED - Abnormal; Notable for the following components:   Glucose, Bld 159 (*)    Calcium , Ion 1.12 (*)    All other components within normal limits  CBG MONITORING, ED - Abnormal; Notable for the following components:   Glucose-Capillary 142 (*)    All other components within normal limits  ETHANOL  PROTIME-INR  DIFFERENTIAL  RAPID URINE DRUG SCREEN, HOSP PERFORMED  MAGNESIUM    EKG: None  Radiology: CT HEAD CODE STROKE WO CONTRAST Result Date: 05/06/2024 EXAM: CT HEAD WITHOUT CONTRAST 05/06/2024 09:34:40 AM TECHNIQUE: CT of the head was performed without the administration of intravenous contrast. Automated exposure control, iterative reconstruction, and/or weight based adjustment of the  mA/kV was utilized to reduce the radiation dose to as low as reasonably achievable. COMPARISON: CT of the head dated 03/23/2024 and MRI of the brain dated 04/22/2024. CLINICAL HISTORY: Neuro deficit, acute, stroke suspected. RIGHT SIDE FACIAL DROOP; SLURRED SPEECH; LKW 0850; Neuro- Kirkpatrick. FINDINGS: BRAIN AND VENTRICLES: No acute hemorrhage. No evidence of acute infarct. No hydrocephalus. No extra-axial collection. No mass effect or midline shift. Postsurgical encephalomalacia changes are again demonstrated within the left temporal lobe. There is a mixed attenuation mass present within the left parietal lobe  which has increased in size since the previous CT from approximately 2.6 x 2.1 cm to approximately 3.9 x 3.4 cm. The mass is now denser along its periphery with a smaller necrotic central core. [place] stroke program early CT (ASPECT) score: Ganglionic (caudate, ic, Lentiform Nucleus, insula, M1-m3): 7. Supraganglionic (m4-m6): 3. Total: 10. ORBITS: No acute abnormality. SINUSES: No acute abnormality. SOFT TISSUES AND SKULL: No acute soft tissue abnormality. No skull fracture. The above findings were communicated to Dr. Michaela at 09:40 AM 05/06/2024 via Amion. IMPRESSION: 1. Mixed attenuation mass in the left parietal lobe, increased in size since the previous CT from approximately 2.6 x 2.1 cm to approximately 3.9 x 3.4 cm, now denser along its periphery with a smaller necrotic central core. 2. Postsurgical encephalomalacia changes within the left temporal lobe. 3. ASPECT score: 10. Electronically signed by: Evalene Coho MD 05/06/2024 09:46 AM EDT RP Workstation: [id number]     .Critical Care  Performed by: Levander Houston, MD Authorized by: Levander Houston, MD   Critical care provider statement:    Critical care time (minutes):  60   Critical care end time:  05/06/2024 11:37 AM   Critical care time was exclusive of:  Separately billable procedures and treating other patients and teaching  time   Critical care was time spent personally by me on the following activities:  Development of treatment plan with patient or surrogate, discussions with consultants, evaluation of patient's response to treatment, examination of patient, ordering and review of laboratory studies, ordering and review of radiographic studies, ordering and performing treatments and interventions, pulse oximetry, re-evaluation of patient's condition and review of old charts    Medications Ordered in the ED  LORazepam  (ATIVAN ) 2 MG/ML injection (has no administration in time range)  dexamethasone  (DECADRON ) injection 10 mg (has no administration in time range)  LORazepam  (ATIVAN ) injection 0.5 mg (has no administration in time range)  LORazepam  (ATIVAN ) 2 MG/ML injection (2 mg  Given 05/06/24 0932)  levETIRAcetam  (KEPPRA ) undiluted injection 4,000 mg (4,000 mg Intravenous Given 05/06/24 0940)  fosPHENYtoin  (CEREBYX ) 1,360 mg PE in sodium chloride  0.9 % 50 mL IVPB (1,360 mg PE Intravenous New Bag/Given 05/06/24 1049)  LORazepam  (ATIVAN ) injection 2 mg (2 mg Intravenous Given 05/06/24 0957)    Clinical Course as of 05/06/24 1137  Wed May 06, 2024  1025 Dr. Michaela reports conversation in discussion with family re DNR/DNI  [DR]  1132 CT head significant for mixed attenuation mass in left parietal lobe increased in size since previous CT scan with postsurgical encephalomalacia [DR]  1133 CBC reviewed interpreted and normal limits Complete metabolic panel reviewed interpreted significant for hyperglycemia at 157 otherwise within normal limits [DR]    Clinical Course User Index [DR] Levander Houston, MD                                 Medical Decision Making Amount and/or Complexity of Data Reviewed Labs: ordered. Radiology: ordered.  Risk Prescription drug management. Decision regarding hospitalization.   Patient presents with altered mental status, right facial droop, and rhythmic movement of face.   Differential diagnosis includes but is not limited to seizure, mass effect from known tumor, bleeding, electrolyte abnormalities, infection Patient evaluated with CT of head consistent with her known glioblastoma. Patient received Ativan  1 mg. Patient is being seen with Dr. Michaela.  He is adding Keppra . Long conversation with daughter regarding goals of care.  Currently she does not  wish to have chest compressions, shocks, or other interventions at the patient was going to go into a lethal rhythm.  She is trying to decide currently about intubation status. She has requested hospice care.  However, she does want her mother to recover from seizures enough to have a meaningful end-of-life and talk to family. Care discussed with Dr. Georgina, on-call for hospitalist who will see for admission. Awaiting consult from palliative care     Final diagnoses:  Seizure-like activity [facility])  Glioblastoma [facility])    ED Discharge Orders     None          Levander Houston, MD 05/06/24 1137

## 2024-05-06 NOTE — Progress Notes (Signed)
 Moved pt to 40 ED. Troubleshoot camera IP with IT.

## 2024-05-06 NOTE — Code Documentation (Signed)
 Stroke Response Nurse Documentation Code Documentation  KENECIA BARREN is a 78 y.o. female arriving to [facility]  via [employer] on 9/24 with past medical hx of glioglastoma, DM, graves disease, HTN, HLD. On No antithrombotic. Code stroke was activated by ED.   Patient from restaurant where she was eating breakfast with family and now complaining of what son in law reported as worsening aphasia and facial droop as well as R gaze. Brought to ED and code stroke activated on her arrival.     Stroke team at the bedside on patient arrival. Labs drawn and patient cleared for CT by Dr. Levander. Patient to CT with team. NIHSS 8, see documentation for details and code stroke times. Patient with disoriented, right gaze preference , right arm weakness, Expressive aphasia , and dysarthria  on exam. The following imaging was completed:  CT Head. Patient is not a candidate for IV Thrombolytic due to stroke not suspected. Patient is not a candidate for IR due to LVO not suspected.   Care Plan: AEDs, cancel code stroke.   Bedside handoff with ED RN Mitzie.    Lauraine LITTIE Searle  Stroke Response RN

## 2024-05-07 ENCOUNTER — Inpatient Hospital Stay (HOSPITAL_COMMUNITY)

## 2024-05-07 ENCOUNTER — Observation Stay (HOSPITAL_COMMUNITY)

## 2024-05-07 ENCOUNTER — Encounter (HOSPITAL_COMMUNITY): Payer: Self-pay | Admitting: Hospitalist

## 2024-05-07 DIAGNOSIS — I10 Essential (primary) hypertension: Secondary | ICD-10-CM | POA: Diagnosis present

## 2024-05-07 DIAGNOSIS — Z8042 Family history of malignant neoplasm of prostate: Secondary | ICD-10-CM | POA: Diagnosis not present

## 2024-05-07 DIAGNOSIS — E0500 Thyrotoxicosis with diffuse goiter without thyrotoxic crisis or storm: Secondary | ICD-10-CM | POA: Diagnosis present

## 2024-05-07 DIAGNOSIS — F419 Anxiety disorder, unspecified: Secondary | ICD-10-CM | POA: Diagnosis present

## 2024-05-07 DIAGNOSIS — C713 Malignant neoplasm of parietal lobe: Secondary | ICD-10-CM | POA: Diagnosis not present

## 2024-05-07 DIAGNOSIS — R29810 Facial weakness: Secondary | ICD-10-CM | POA: Diagnosis present

## 2024-05-07 DIAGNOSIS — Z79890 Hormone replacement therapy (postmenopausal): Secondary | ICD-10-CM | POA: Diagnosis not present

## 2024-05-07 DIAGNOSIS — R4701 Aphasia: Secondary | ICD-10-CM | POA: Diagnosis present

## 2024-05-07 DIAGNOSIS — Z515 Encounter for palliative care: Secondary | ICD-10-CM | POA: Diagnosis not present

## 2024-05-07 DIAGNOSIS — G40909 Epilepsy, unspecified, not intractable, without status epilepticus: Secondary | ICD-10-CM

## 2024-05-07 DIAGNOSIS — G4733 Obstructive sleep apnea (adult) (pediatric): Secondary | ICD-10-CM | POA: Diagnosis present

## 2024-05-07 DIAGNOSIS — Z87891 Personal history of nicotine dependence: Secondary | ICD-10-CM | POA: Diagnosis not present

## 2024-05-07 DIAGNOSIS — R569 Unspecified convulsions: Secondary | ICD-10-CM | POA: Diagnosis present

## 2024-05-07 DIAGNOSIS — Z8249 Family history of ischemic heart disease and other diseases of the circulatory system: Secondary | ICD-10-CM | POA: Diagnosis not present

## 2024-05-07 DIAGNOSIS — I341 Nonrheumatic mitral (valve) prolapse: Secondary | ICD-10-CM | POA: Diagnosis present

## 2024-05-07 DIAGNOSIS — Z8052 Family history of malignant neoplasm of bladder: Secondary | ICD-10-CM | POA: Diagnosis not present

## 2024-05-07 DIAGNOSIS — E1140 Type 2 diabetes mellitus with diabetic neuropathy, unspecified: Secondary | ICD-10-CM | POA: Diagnosis present

## 2024-05-07 DIAGNOSIS — Z833 Family history of diabetes mellitus: Secondary | ICD-10-CM | POA: Diagnosis not present

## 2024-05-07 DIAGNOSIS — C712 Malignant neoplasm of temporal lobe: Secondary | ICD-10-CM | POA: Diagnosis present

## 2024-05-07 DIAGNOSIS — G9389 Other specified disorders of brain: Secondary | ICD-10-CM | POA: Diagnosis present

## 2024-05-07 DIAGNOSIS — E785 Hyperlipidemia, unspecified: Secondary | ICD-10-CM | POA: Diagnosis present

## 2024-05-07 DIAGNOSIS — E039 Hypothyroidism, unspecified: Secondary | ICD-10-CM | POA: Diagnosis present

## 2024-05-07 DIAGNOSIS — Z66 Do not resuscitate: Secondary | ICD-10-CM | POA: Diagnosis present

## 2024-05-07 DIAGNOSIS — Z7189 Other specified counseling: Secondary | ICD-10-CM | POA: Diagnosis not present

## 2024-05-07 DIAGNOSIS — Z7984 Long term (current) use of oral hypoglycemic drugs: Secondary | ICD-10-CM | POA: Diagnosis not present

## 2024-05-07 DIAGNOSIS — Z85820 Personal history of malignant melanoma of skin: Secondary | ICD-10-CM | POA: Diagnosis not present

## 2024-05-07 DIAGNOSIS — Z79899 Other long term (current) drug therapy: Secondary | ICD-10-CM | POA: Diagnosis not present

## 2024-05-07 DIAGNOSIS — G40101 Localization-related (focal) (partial) symptomatic epilepsy and epileptic syndromes with simple partial seizures, not intractable, with status epilepticus: Secondary | ICD-10-CM | POA: Diagnosis present

## 2024-05-07 LAB — COMPREHENSIVE METABOLIC PANEL WITH GFR
ALT: 16 U/L (ref 0–44)
AST: 19 U/L (ref 15–41)
Albumin: 3.6 g/dL (ref 3.5–5.0)
Alkaline Phosphatase: 56 U/L (ref 38–126)
Anion gap: 16 — ABNORMAL HIGH (ref 5–15)
BUN: 19 mg/dL (ref 8–23)
CO2: 20 mmol/L — ABNORMAL LOW (ref 22–32)
Calcium: 9.1 mg/dL (ref 8.9–10.3)
Chloride: 104 mmol/L (ref 98–111)
Creatinine, Ser: 0.76 mg/dL (ref 0.44–1.00)
GFR, Estimated: >60 mL/min (ref >60)
Glucose, Bld: 142 mg/dL — ABNORMAL HIGH (ref 70–99)
Potassium: 3.8 mmol/L (ref 3.5–5.1)
Sodium: 140 mmol/L (ref 135–145)
Total Bilirubin: 1.1 mg/dL (ref 0.0–1.2)
Total Protein: 6.7 g/dL (ref 6.5–8.1)

## 2024-05-07 LAB — CBC WITH DIFFERENTIAL/PLATELET
Abs Immature Granulocytes: 0.05 K/uL (ref 0.00–0.07)
Basophils Absolute: 0 K/uL (ref 0.0–0.1)
Basophils Relative: 0 %
Eosinophils Absolute: 0 K/uL (ref 0.0–0.5)
Eosinophils Relative: 0 %
HCT: 42.1 % (ref 36.0–46.0)
Hemoglobin: 13.7 g/dL (ref 12.0–15.0)
Immature Granulocytes: 1 %
Lymphocytes Relative: 9 %
Lymphs Abs: 1 K/uL (ref 0.7–4.0)
MCH: 25.9 pg — ABNORMAL LOW (ref 26.0–34.0)
MCHC: 32.5 g/dL (ref 30.0–36.0)
MCV: 79.7 fL — ABNORMAL LOW (ref 80.0–100.0)
Monocytes Absolute: 0.9 K/uL (ref 0.1–1.0)
Monocytes Relative: 9 %
Neutro Abs: 8.4 K/uL — ABNORMAL HIGH (ref 1.7–7.7)
Neutrophils Relative %: 81 %
Platelets: 248 K/uL (ref 150–400)
RBC: 5.28 MIL/uL — ABNORMAL HIGH (ref 3.87–5.11)
RDW: 14.1 % (ref 11.5–15.5)
WBC: 10.4 K/uL (ref 4.0–10.5)
nRBC: 0 % (ref 0.0–0.2)

## 2024-05-07 LAB — RAPID URINE DRUG SCREEN, HOSP PERFORMED
Amphetamines: NOT DETECTED
Barbiturates: NOT DETECTED
Benzodiazepines: NOT DETECTED
Cocaine: NOT DETECTED
Opiates: NOT DETECTED
Tetrahydrocannabinol: NOT DETECTED

## 2024-05-07 LAB — MAGNESIUM: Magnesium: 2 mg/dL (ref 1.7–2.4)

## 2024-05-07 MED ORDER — LORAZEPAM 2 MG/ML IJ SOLN: 0.2500 mg | INTRAMUSCULAR | Status: DC | PRN

## 2024-05-07 MED ORDER — LORAZEPAM 2 MG/ML IJ SOLN
0.2500 mg | INTRAMUSCULAR | Status: DC | PRN
  Administered 2024-05-07: 0.5 mg via INTRAVENOUS
  Filled 2024-05-07 (×2): qty 1

## 2024-05-07 MED ORDER — ACETAMINOPHEN 10 MG/ML IV SOLN
1000.0000 mg | INTRAVENOUS | Status: AC
  Administered 2024-05-07 – 2024-05-08 (×4): 1000 mg via INTRAVENOUS
  Filled 2024-05-07 (×6): qty 100

## 2024-05-07 MED ORDER — CHLORHEXIDINE GLUCONATE CLOTH 2 % EX PADS
6.0000 | MEDICATED_PAD | Freq: Every day | CUTANEOUS | Status: DC
  Administered 2024-05-07 – 2024-05-09 (×3): 6 via TOPICAL

## 2024-05-07 MED ORDER — SODIUM CHLORIDE 0.9 % IV SOLN: INTRAVENOUS | Status: AC

## 2024-05-07 MED ORDER — DEXAMETHASONE SODIUM PHOSPHATE 4 MG/ML IJ SOLN
4.0000 mg | Freq: Two times a day (BID) | INTRAMUSCULAR | Status: DC
  Administered 2024-05-07 – 2024-05-09 (×5): 4 mg via INTRAVENOUS
  Filled 2024-05-07 (×5): qty 1

## 2024-05-07 MED ORDER — LORAZEPAM 2 MG/ML IJ SOLN
0.2500 mg | Freq: Once | INTRAMUSCULAR | Status: AC
  Administered 2024-05-07: 0.25 mg via INTRAVENOUS
  Filled 2024-05-07: qty 1

## 2024-05-07 NOTE — Consult Note (Signed)
 [facility] Neuro-Oncology Consult Note  Patient Care Team: Cleotilde Planas, MD as PCP - General (Family Medicine) Verlin Lonni BIRCH, MD as PCP - Cardiology (Cardiology) Verta Royden DASEN, [place] as Consulting Physician (Podiatry) Teressa Toribio SQUIBB, MD (Inactive) as Attending Physician (Gastroenterology)  CHIEF COMPLAINTS/PURPOSE OF CONSULTATION:  Glioblastoma Status Epilepticus  HISTORY OF PRESENTING ILLNESS:  Yvonne [name] 78 y.o. female presented with ~2 hours of right sided facial, arm twitching consistent with status epilepticus. Seizures resolved with ativan  and AEDs in the ED.  She has been on EEG monitoring in the interim without any breakthrough events.  Prior to seizure she was in normal state of health, at breakfast with family.  Currently altered and history is provided by family.  MEDICAL HISTORY:  Past Medical History:  Diagnosis Date   Anxiety    Chest pressure    Degenerative arthritis    Deviated septum    Diabetes mellitus without complication (HCC)    diet controlled   Diabetic neuropathy (HCC) 12/03/2017   DJD (degenerative joint disease) of knee    Family history of bladder cancer    Family history of brain cancer    Family history of breast cancer    Family history of ovarian cancer    Family history of prostate cancer    Family history of stomach cancer    Family history of throat cancer    Family history of uterine cancer    Graves disease    RADIOACTIVE IODINE 1983   H/O hiatal hernia    Heart palpitations    History of diastolic dysfunction    per echo in 2011   Hyperlipidemia    Hypertension    Hypothyroidism    LVH (left ventricular hypertrophy)    per echo in 2011   Mitral valve prolapse    Neuropathy, peripheral    DR. JENEL 12/15   Normal nuclear stress test 2011   OSA (obstructive sleep apnea)    Polyneuropathy in other diseases classified elsewhere 05/21/2013   Rosacea 2019   MELANOMA CHEST REMOVED IN SITU   Sleep  difficulties    Tinnitus     SURGICAL HISTORY: Past Surgical History:  Procedure Laterality Date   ABDOMINAL HYSTERECTOMY  2000   TOTAL   APPENDECTOMY  1989   Brain biospy     CARDIOVASCULAR STRESS TEST  11/09/2009   EF 75%   CARPAL TUNNEL RELEASE  2002   bilateral   COLONOSCOPY WITH PROPOFOL  N/A 10/23/2018   Procedure: COLONOSCOPY WITH PROPOFOL ;  Surgeon: Teressa Toribio SQUIBB, MD;  Location: [facility];  Service: Endoscopy;  Laterality: N/A;   crainectomy     cranectomy     ESOPHAGOGASTRODUODENOSCOPY N/A 11/16/2021   Procedure: ESOPHAGOGASTRODUODENOSCOPY (EGD);  Surgeon: Teressa Toribio SQUIBB, MD;  Location: [facility];  Service: Endoscopy;  Laterality: N/A;   ESOPHAGOGASTRODUODENOSCOPY (EGD) WITH PROPOFOL  N/A 10/23/2018   Procedure: ESOPHAGOGASTRODUODENOSCOPY (EGD) WITH PROPOFOL ;  Surgeon: Teressa Toribio SQUIBB, MD;  Location: [facility];  Service: Endoscopy;  Laterality: N/A;   EUS N/A 10/23/2018   Procedure: UPPER ENDOSCOPIC ULTRASOUND (EUS) RADIAL;  Surgeon: Teressa Toribio SQUIBB, MD;  Location: [facility];  Service: Endoscopy;  Laterality: N/A;   EUS N/A 11/16/2021   Procedure: UPPER ENDOSCOPIC ULTRASOUND (EUS) RADIAL;  Surgeon: Teressa Toribio SQUIBB, MD;  Location: [facility];  Service: Endoscopy;  Laterality: N/A;   MASS EXCISION Left 06/02/2019   Procedure: EXCISION OF SUBCUTANEOUS ABDOMINAL WALL MASSES LEFT LOWER QUADRANT;  Surgeon: Belinda Cough, MD;  [facility];  Service: General;  Laterality: Left;   TONSILLECTOMY  1958   TRANSTHORACIC ECHOCARDIOGRAM  11/01/2009   NORMAL LV FILLING AND DIASTOLIC DYSFUNCTION AND MILD AORTIC SCLEROSIS AND TRACE MITRAL REGURGITATION    SOCIAL HISTORY: Social History   Socioeconomic History   Marital status: Married    Spouse name: Sharman   Number of children: 1   Years of education: COLLEGE   Highest education level: Not on file  Occupational History   Occupation: Retired  Tobacco Use   Smoking status: Former    Current packs/day: 0.00     Average packs/day: 0.5 packs/day for 10.0 years (5.0 ttl pk-yrs)    Types: Cigarettes    Start date: 08/13/1965    Quit date: 08/14/1975    Years since quitting: 48.7   Smokeless tobacco: Never  Vaping Use   Vaping status: Never Used  Substance and Sexual Activity   Alcohol use: Not Currently    Comment: one glass of wine a week   Drug use: No   Sexual activity: Not on file  Other Topics Concern   Not on file  Social History Narrative   Lives at home w/ her husband, Sharman.   Right-handed.   Drinks no caffeine but does eat chocolate occasionally.   epworth sleepiness scale = 3 (01/10/16)   Social Drivers of Health   Financial Resource Strain: Low Risk  (02/20/2024)   Received from [facility]   Overall Financial Resource Strain (CARDIA)    Difficulty of Paying Living Expenses: Not hard at all  Food Insecurity: No Food Insecurity (02/20/2024)   Received from [facility]   Hunger Vital Sign    Within the past 12 months, you worried that your food would run out before you got the money to buy more.: Never true    Within the past 12 months, the food you bought just didn't last and you didn't have money to get more.: Never true  Transportation Needs: No Transportation Needs (02/20/2024)   Received from [facility] - Transportation    In the past 12 months, has lack of transportation kept you from medical appointments or from getting medications?: No    Lack of Transportation (Non-Medical): No  Physical Activity: Not on file  Stress: Not on file  Social Connections: Not on file  Intimate Partner Violence: Not At Risk (06/15/2022)   Humiliation, Afraid, Rape, and Kick questionnaire    Fear of Current or Ex-Partner: No    Emotionally Abused: No    Physically Abused: No    Sexually Abused: No    FAMILY HISTORY: Family History  Problem Relation Age of Onset   Hypertension Mother    Heart disease Mother    Dementia  Mother    Heart disease Father    Arthritis Father    Diabetes Father    Hypertension Father    Pancreatic cancer Brother 24       pancreatic   Other Brother        automobile accident   Throat cancer Maternal Uncle    Brain cancer Paternal Aunt 62       malignant brain cancer   Stroke Maternal Grandmother    Heart disease Maternal Grandfather    Pancreatic cancer Cousin 17       pancreatic (maternal first cousin)   Pancreatic cancer Cousin 28       pancreatic, smoker (maternal first cousin)   Breast cancer Cousin 70  breast (maternal first cousin)   Ovarian cancer Cousin    Uterine cancer Cousin 94       uterine (paternal first cousin)   Cancer Cousin 71       unknown primary (paternal first cousin)   Bladder Cancer Cousin 11       (paternal first cousin)   Lung cancer Cousin    Throat cancer Cousin    Prostate cancer Cousin        (maternal first cousin)   Stomach cancer Cousin 59       (maternal first cousin)   Cancer Other 75       pancreatic neuroendocrine carcinoma   Colon cancer Neg Hx    Neuropathy Neg Hx     ALLERGIES:  is allergic to tobramycin, sulfa antibiotics, and erythromycin.  MEDICATIONS:  Current Facility-Administered Medications  Medication Dose Route Frequency Provider Last Rate Last Admin   0.9 %  sodium chloride  infusion   Intravenous Continuous Samtani, Jai-Gurmukh, MD 40 mL/hr at 05/07/24 0833 New Bag at 05/07/24 9166   acetaminophen  (OFIRMEV ) IV 1,000 mg  1,000 mg Intravenous Q4H Samtani, Jai-Gurmukh, MD   Stopped at 05/07/24 1400   Chlorhexidine  Gluconate Cloth 2 % PADS 6 each  6 each Topical Daily Samtani, Jai-Gurmukh, MD       dexamethasone  (DECADRON ) injection 4 mg  4 mg Intravenous Q12H Samtani, Jai-Gurmukh, MD   4 mg at 05/07/24 0941   lacosamide  (VIMPAT ) 100 mg in sodium chloride  0.9 % 25 mL IVPB  100 mg Intravenous Q12H Michaela Aisha SQUIBB, MD   Stopped at 05/07/24 1014   levETIRAcetam  (KEPPRA ) undiluted injection 1,000 mg   1,000 mg Intravenous Q12H Michaela Aisha SQUIBB, MD   1,000 mg at 05/07/24 0941   LORazepam  (ATIVAN ) injection 0.25-0.5 mg  0.25-0.5 mg Intravenous Q2H PRN Samtani, Jai-Gurmukh, MD   0.5 mg at 05/07/24 1344   metoprolol  succinate (TOPROL -XL) 24 hr tablet 100 mg  100 mg Oral Daily Georgina Basket, MD       Current Outpatient Medications  Medication Sig Dispense Refill   acetaminophen  (TYLENOL ) 500 MG tablet Take 1,000 mg by mouth every 8 (eight) hours as needed for moderate pain.     Cholecalciferol  (VITAMIN D ) 50 MCG (2000 UT) tablet Take 2,000 Units by mouth in the morning.     fluticasone  (FLONASE ) 50 MCG/ACT nasal spray Place 2 sprays into both nostrils at bedtime as needed for allergies.     gabapentin  (NEURONTIN ) 300 MG capsule Take 300 mg by mouth at bedtime.      JARDIANCE 10 MG TABS tablet Take 10 mg by mouth in the morning.     lacosamide  (VIMPAT ) 50 MG TABS tablet Take 1 tablet (50 mg total) by mouth 2 (two) times daily. 60 tablet 3   levothyroxine  (SYNTHROID ) 88 MCG tablet Take 88 mcg by mouth daily before breakfast.     metFORMIN (GLUCOPHAGE) 500 MG tablet Take 500 mg by mouth 2 (two) times daily.     metoprolol  succinate (TOPROL -XL) 100 MG 24 hr tablet Take 100 mg by mouth in the morning.     ondansetron  (ZOFRAN ) 8 MG tablet Take 1 tablet (8 mg total) by mouth every 8 (eight) hours as needed for nausea or vomiting. 30 tablet 1   prochlorperazine  (COMPAZINE ) 10 MG tablet Take 1 tablet (10 mg total) by mouth every 6 (six) hours as needed for nausea or vomiting. 30 tablet 0   rosuvastatin  (CRESTOR ) 5 MG tablet Take 5 mg by mouth at  bedtime.     mirtazapine (REMERON) 7.5 MG tablet Take 7.5 mg by mouth at bedtime. (Patient not taking: Reported on 05/06/2024)     Facility-Administered Medications Ordered in Other Encounters  Medication Dose Route Frequency Provider Last Rate Last Admin   influenza vaccine adjuvanted (FLUAD) injection 0.5 mL  0.5 mL Intramuscular Once Rynn Markiewicz K, MD         REVIEW OF SYSTEMS:   Constitutional: Denies fevers, chills or abnormal weight loss Eyes: Denies blurriness of vision Ears, nose, mouth, throat, and face: Denies mucositis or sore throat Respiratory: Denies cough, dyspnea or wheezes Cardiovascular: Denies palpitation, chest discomfort or lower extremity swelling Gastrointestinal:  Denies nausea, constipation, diarrhea GU: Denies dysuria or incontinence Skin: Denies abnormal skin rashes Neurological: Per HPI Musculoskeletal: Denies joint pain, back or neck discomfort. No decrease in ROM Behavioral/Psych: Denies anxiety, disturbance in thought content, and mood instability   PHYSICAL EXAMINATION: Vitals:   05/07/24 1314 05/07/24 1430  BP:  123/68  Pulse: (!) 108 88  Resp: 18   Temp:  98 F (36.7 C)  SpO2: 96% 94%   KPS: 50. General: Alert, cooperative, pleasant, in no acute distress Head: Normal EENT: No conjunctival injection or scleral icterus. Oral mucosa moist Lungs: Resp effort normal Cardiac: Regular rate and rhythm Abdomen: Soft, non-distended abdomen Skin: No rashes cyanosis or petechiae. Extremities: No clubbing or edema  NEUROLOGIC EXAM: Mental Status: Eyes closed, moving independently in restless manner, arouses purposefully to noxious stimulus with grimace and right greater than left arm. Cranial Nerves: Limited exam, intact to threat.  Motor: All limbs life spontaneously antivgravity, L>R    LABORATORY DATA:  I have reviewed the data as listed Lab Results  Component Value Date   WBC 10.4 05/07/2024   HGB 13.7 05/07/2024   HCT 42.1 05/07/2024   MCV 79.7 (L) 05/07/2024   PLT 248 05/07/2024   Recent Labs    03/06/24 1020 05/06/24 0923 05/06/24 0930 05/07/24 1028  NA 141 137 138 140  K 3.9 4.0 4.0 3.8  CL 106 102 101 104  CO2 21* 23  --  20*  GLUCOSE 123* 157* 159* 142*  BUN 19 15 16 19   CREATININE 0.62 0.68 0.60 0.76  CALCIUM  9.3 9.2  --  9.1  GFRNONAA >60 >60  --  >60  PROT 6.6 6.7   --  6.7  ALBUMIN 3.9 3.7  --  3.6  AST 17 21  --  19  ALT 18 15  --  16  ALKPHOS 53 56  --  56  BILITOT 0.4 0.7  --  1.1    RADIOGRAPHIC STUDIES: I have personally reviewed the radiological images as listed and agreed with the findings in the report. Overnight EEG with video Result Date: 05/07/2024 Shelton Arlin KIDD, MD     05/07/2024  9:06 AM Patient Name: SHAQUANDRA GALANO MRN: [id number] Epilepsy Attending: Arlin KIDD Shelton Referring Physician/Provider: Michaela Aisha SQUIBB, MD Duration: 2 05/06/2024 1209 to 05/07/2024 0900  Patient history: 78 y.o. female hx of anxiety, left glioblastoma who presents for acute onset of right sided facial droop, and slurred speech. Patient appears to be having a focal seizure with right gaze preference with eyes and facial twitching. EEG to evaluate for seizure.  Level of alertness: lethargic, asleep  AEDs during EEG study: LEV, LCM, PHT, ativan   Technical aspects: This EEG study was done with scalp electrodes positioned according to the 10-20 International system of electrode placement. Electrical activity was reviewed with band  pass filter of 1-70Hz , sensitivity of 7 uV/mm, display speed of 59mm/sec with a 60Hz  notched filter applied as appropriate. EEG data were recorded continuously and digitally stored.  Video monitoring was available and reviewed as appropriate.  Description: EEG showed continuous generalized and lateralized left hemisphere 3 to 6 Hz theta-delta slowing admixed with 15 to 18 Hz beta activity distributed symmetrically and diffusely. Sleep was characterized by vertex waves, sleep spindles (12 to 14 Hz), maximal frontocentral region. Spikes were noted in left fronto-centro-temporal region. Hyperventilation and photic stimulation were not performed.    ABNORMALITY - Spikes, left fronto-centro-temporal region. - Continuous slow, generalized  and lateralized left hemisphere  IMPRESSION: This study showed evidence of epileptogenicity arising from   left  fronto-centro-temporal region. Additionally there is cortical dysfunction arising from left hemisphere likely secondary to underlying structural abnormality/ tumor. Additionally there is moderate diffuse encephalopathy. No seizures were seen throughout the recording.  Of note, focal motor seizures may not seen on scalp EEG. Clinical correlation is recommended.   Arlin MALVA Krebs   EEG adult Result Date: 05/06/2024 Krebs Arlin MALVA, MD     05/07/2024  9:07 AM Patient Name: DANEL STUDZINSKI MRN: [id number] Epilepsy Attending: Arlin MALVA Krebs Referring Physician/Provider: Michaela Aisha SQUIBB, MD Date: 05/06/2024 Duration: 23.14 mins Patient history: 78 y.o. female hx of anxiety, left glioblastoma who presents for acute onset of right sided facial droop, and slurred speech. Patient appears to be having a focal seizure with right gaze preference with eyes and facial twitching. EEG to evaluate for seizure. Level of alertness: lethargic, asleep AEDs during EEG study: LEV, LCM, PHT, ativan  Technical aspects: This EEG study was done with scalp electrodes positioned according to the 10-20 International system of electrode placement. Electrical activity was reviewed with band pass filter of 1-70Hz , sensitivity of 7 uV/mm, display speed of 18mm/sec with a 60Hz  notched filter applied as appropriate. EEG data were recorded continuously and digitally stored.  Video monitoring was available and reviewed as appropriate. Description: EEG showed continuous generalized and lateralized left hemisphere 3 to 6 Hz theta-delta slowing admixed with 15 to 18 Hz beta activity distributed symmetrically and diffusely. Sleep was characterized by sleep spindles (12 to 14 Hz), maximal frontocentral region. Hyperventilation and photic stimulation were not performed.   ABNORMALITY - Continuous slow, generalized  and lateralized left hemisphere IMPRESSION: This study is suggestive of cortical dysfunction arising from left hemisphere likely  secondary to underlying structural abnormality/ tumor. Additionally there is moderate diffuse encephalopathy. No seizures or epileptiform discharges were seen throughout the recording. Of note, focal motor seizures may not seen on scalp EEG. Clinical correlation is recommended. Arlin MALVA Krebs   CT HEAD CODE STROKE WO CONTRAST Result Date: 05/06/2024 EXAM: CT HEAD WITHOUT CONTRAST 05/06/2024 09:34:40 AM TECHNIQUE: CT of the head was performed without the administration of intravenous contrast. Automated exposure control, iterative reconstruction, and/or weight based adjustment of the mA/kV was utilized to reduce the radiation dose to as low as reasonably achievable. COMPARISON: CT of the head dated 03/23/2024 and MRI of the brain dated 04/22/2024. CLINICAL HISTORY: Neuro deficit, acute, stroke suspected. RIGHT SIDE FACIAL DROOP; SLURRED SPEECH; LKW 0850; Neuro- Kirkpatrick. FINDINGS: BRAIN AND VENTRICLES: No acute hemorrhage. No evidence of acute infarct. No hydrocephalus. No extra-axial collection. No mass effect or midline shift. Postsurgical encephalomalacia changes are again demonstrated within the left temporal lobe. There is a mixed attenuation mass present within the left parietal lobe which has increased in size since the previous CT from approximately 2.6  x 2.1 cm to approximately 3.9 x 3.4 cm. The mass is now denser along its periphery with a smaller necrotic central core. [place] stroke program early CT (ASPECT) score: Ganglionic (caudate, ic, Lentiform Nucleus, insula, M1-m3): 7. Supraganglionic (m4-m6): 3. Total: 10. ORBITS: No acute abnormality. SINUSES: No acute abnormality. SOFT TISSUES AND SKULL: No acute soft tissue abnormality. No skull fracture. The above findings were communicated to Dr. Michaela at 09:40 AM 05/06/2024 via Amion. IMPRESSION: 1. Mixed attenuation mass in the left parietal lobe, increased in size since the previous CT from approximately 2.6 x 2.1 cm to approximately 3.9 x  3.4 cm, now denser along its periphery with a smaller necrotic central core. 2. Postsurgical encephalomalacia changes within the left temporal lobe. 3. ASPECT score: 10. Electronically signed by: Evalene Coho MD 05/06/2024 09:46 AM EDT RP Workstation: [id number]    ASSESSMENT & PLAN:  Glioblastoma Status Epilepticus  MEILYN HEINDL presents today with post-ictal encephalopathic state.  She has stabilized now on Keppra  1000mg  BID and Vimpat  100mg  BID.  We had an extensive goals of care discussion at bedside with the family.  We explained that a hospice disposition would have been reasonable even before this latest setback, given her recent progression of Glioblastoma.  On the other hand, on paper she could recover from this encephalopathy and reach prior baseline; they understand this could take a number of days or even weeks.  They also understand that some brain tumor patients do not make a full cognitive recovery from prolonged seizure/status like this.   Family at this time is leaning towards giving her another couple of days, in order assess potential recovery from the encephalopathy.  We think this would be reasonable.  A hospice disposition remains a strong option if she does not recover meaningful function.    Can continue to follow remotely, or with med-onc team here in person going forward if needed.    All questions were answered. The patient knows to call the clinic with any problems, questions or concerns.  The total time spent in the encounter was 60 minutes and more than 50% was on counseling and review of test results     Arthea MARLA Manns, MD 05/07/2024 3:23 PM

## 2024-05-07 NOTE — Plan of Care (Signed)
   Problem: Coping: Goal: Level of anxiety will decrease Outcome: Progressing   Problem: Pain Managment: Goal: General experience of comfort will improve and/or be controlled Outcome: Progressing

## 2024-05-07 NOTE — Progress Notes (Addendum)
 [facility]   ROUNDING   NOTE LASHAUNDA SCHILD FMW:[id number]  DOB: June 30, 1946  DOA: 05/06/2024  PCP: Cleotilde Planas, MD  05/07/2024,7:17 AM  LOS: 0 days    Code Status: DNR currently     from: Home   78 year old female WHO stage IV glioblastoma temporal lobe on prior Atezolizumab/Avastin-recent infusion 04/22/2024-follows also with [facility] clinical study [facility] --Dr. Esteban Recent progression on MRI 04/22/2024 On prophylactic Vimpat  50 twice daily at home Diabetes, Graves' disease, diabetic neuropathy, OSA  9/25 [facility] ED eval for refractory partial status-acute right-sided facial droop slurred speech at the restaurant that she was at with her family sudden right facial twisting right gaze drooling code stroke activated-Ativan  given CT = mixed attenuation mass left parietal lobe increased in size from prior CT Keppra  4 g load +1.36 g fosphenytoin --patient stopped seizing during fosphenytoin  load Also loaded with Decadron  10 now on 2 mg orally daily Decision made to pursue EEG  made DNR in ED Further conversations ongoing with palliative care/neuro oncologist   Labs on admit Sodium 137 potassium 4.0 chloride 102 BUN/creatinine 15/0.6 LFTs normal WBC 8.0 hemoglobin 14 platelet 218   Imaging CT head code stroke mixed attenuation mass left parietal lobe = now 3.9 X3.4 (previous 2.6 X2.1) denser along periphery with smaller necrotic central core-postsurgical encephalomalacia left temporal lobe EEG suggestive cortical dysfunction left hemisphere secondary to structural abnormality/tumor.     Assessment  & Plan :     Refractory status on admission now controlled secondary to glioblastoma Decadron  4 mg every 12, Continue suppressive Vimpat  100 IV twice daily Keppra  1 g twice daily IV Unsafe to eat-NS 40 cc/H For agitation would use IV Tylenol  and intermittent lorazepam  0.25 every 4 as needed-WOULD NOT SEDATE completely--- goal is for patient to be more interactive to voice her wishes if able Patient is not  safe to eat and will require maximal cues and SLP reevaluation Follow EEG and neuro advise going forward High-grade glioblastoma poor prognosis overall Has been told less than 3 months prognosis previously On clinic at [facility] and recent treatments here locally as per neuro-oncology notes Allowing natural history/timeframe for improvement over the next several days under guidance of neuro oncologist/palliative care Appreciative of oncology expertise Diabetes mellitus with neuropathy Would not cover sugars unless elevated on be met-expect sugars will be a little high because is on steroids Jardiance 10 metformin 500 twice daily on hold Gabapentin  300 at bedtime on hold OSA not on CPAP HTN Unsafe to eat-hold metoprolol  XL for now Graves' disease Holding levothyroxine  for now   Disposition Complicated-see former notes from neuro oncologist palliative care and neurology Patient is now DNR We are attempting to see how well patient awakens to be able to interact and meaningfully help us  direct her own goals of care In the setting of very poor prognosis however regardless of what we do family/daughter 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, 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 Appreciate all consultant input   Data Reviewed:   Sodium 140 potassium 3.8 BUN/creatinine 19/0.7 LFTs normal WBC 10.4 hemoglobin 13.7 platelet 248 Drug screen negative  DVT prophylaxis: SCD  Status is: Observation The patient will require care spanning > 2 midnights and should be moved to inpatient because:   Awaiting improvement in mental state     Current Dispo: Unclear     Subjective:   Unresponsive this morning when I saw her more responsive and moving in the afternoon at the bedside Cannot orient or awaken enough to eat  Long discussions with family this morning in the emergency room and then subsequently upstairs Patient seems comfortable but intermittently agitated    Objective + exam Vitals:   05/07/24  0445 05/07/24 0530 05/07/24 0600 05/07/24 0700  BP: (!) 140/62 121/69 131/65 127/64  Pulse: 80 74 79 79  Resp: 18 17 18 20   Temp:      TempSrc:      SpO2: 96% 95% 96% 97%  Weight:      Height:       Filed Weights   05/06/24 0900  Weight: 68 kg     Examination: Cachectic white female no distress postop changes noted to head S1-S2 tachycardic Abdomen soft no rebound Pupils are about 3 to 4 mm equally reactive bilaterally No icterus Abdomen is soft no rebound No lower extremity edema Cannot assess psych given decreased sensorium     Scheduled Meds:  dexamethasone   2 mg Oral Daily   levETIRAcetam   1,000 mg Intravenous Q12H   levothyroxine   88 mcg Oral Q0600   metoprolol  succinate  100 mg Oral Daily   rosuvastatin   5 mg Oral Daily   Continuous Infusions:  lacosamide  (VIMPAT ) IV Stopped (05/06/24 2219)    Time 75 minutes  Colen Grimes, MD  [facility]

## 2024-05-07 NOTE — Progress Notes (Signed)
 Ativan  pulled from pyxis to give pt for agitation per family, upon going into the room pt was still and resting. Pt's primary nurse Will, performed oral care, pt then became mildly restless but did not warrant getting IV ativan  at this time. Both this nurse and pt's primary nurse agreed that pt did not need the ativan  at this time, as well primary nurse reports MD would like to see how pt functions without sedation unless necessary.  The Ativan  removed from the pyxis was returned,  full 2mg  vial unopened, 1.5mg  was not wasted as first indicated in the pyxis when it was first removed.   Madelin Barefoot, RN SWOT

## 2024-05-07 NOTE — Progress Notes (Unsigned)
   I have met with the pt and family at bedside to discuss hospice services and comfort care approach. They are in agreement with proceeding toward d/c with hospice care. What is uncertain at this time is the d/c disposition. If she wakes up some and is able to take meds by mouth and go home that is the pt's preference. However, if she continues to show no improvement and is unable to swallow medications they would like for her to be evaluated for the in patient facility in [facility]. They would like the next 24-48 hours to see how this plays out before deciding.   Magdalena Berber RN [phone]

## 2024-05-07 NOTE — Procedures (Signed)
 Patient Name: Yvonne [name]  MRN: [id number]  Epilepsy Attending: Arlin MALVA Krebs  Referring Physician/Provider: Michaela Aisha SQUIBB, MD  Duration:  05/07/2024 1209 to 05/07/2024 1419   Patient history: 78 y.o. female hx of anxiety, left glioblastoma who presents for acute onset of right sided facial droop, and slurred speech. Patient appears to be having a focal seizure with right gaze preference with eyes and facial twitching. EEG to evaluate for seizure.   Level of alertness: lethargic, asleep   AEDs during EEG study: LEV, LCM, PHT, ativan    Technical aspects: This EEG study was done with scalp electrodes positioned according to the 10-20 International system of electrode placement. Electrical activity was reviewed with band pass filter of 1-70Hz , sensitivity of 7 uV/mm, display speed of 7mm/sec with a 60Hz  notched filter applied as appropriate. EEG data were recorded continuously and digitally stored.  Video monitoring was available and reviewed as appropriate.   Description: EEG showed continuous generalized and lateralized left hemisphere 3 to 6 Hz theta-delta slowing admixed with 15 to 18 Hz beta activity distributed symmetrically and diffusely. Sleep was characterized by vertex waves, sleep spindles (12 to 14 Hz), maximal frontocentral region. Spikes were noted in left fronto-centro-temporal region. Hyperventilation and photic stimulation were not performed.      ABNORMALITY - Spikes, left fronto-centro-temporal region.  - Continuous slow, generalized  and lateralized left hemisphere   IMPRESSION: This study showed evidence of epileptogenicity arising from   left fronto-centro-temporal region. Additionally there is cortical dysfunction arising from left hemisphere likely secondary to underlying structural abnormality/ tumor. Additionally there is moderate diffuse encephalopathy. No seizures were seen throughout the recording.   [name] O Karizma Cheek

## 2024-05-07 NOTE — Progress Notes (Signed)
 SLP Cancellation Note  Patient Details Name: Yvonne [name] MRN: [id number] DOB: 1946/05/07   Cancelled treatment:       Reason Eval/Treat Not Completed: Fatigue/lethargy limiting ability to participate- D/W nurse and family; SLP will return in am for swallowing assessment.  [name] L. Vona, MA CCC/SLP Clinical Specialist - Acute Care SLP Acute Rehabilitation Services Office number [phone]    [name] 05/07/2024, 4:04 PM

## 2024-05-07 NOTE — Progress Notes (Addendum)
 Palliative Medicine Inpatient Follow Up Note HPI: Yvonne [name] is a 78 y.o. female with past medical hx of glioglastoma, DM, graves disease, HTN, HLD. Yvonne [name] was brought from [employer] where she was eating breakfast with family and now complaining of what son in law reported as worsening aphasia and facial droop as well as R gaze.  Palliative care asked to support additional goals of care conversations.   Today's Discussion 05/07/2024  *Please note that this is a verbal dictation therefore any spelling or grammatical errors are due to the Dragon Medical One system interpretation.  Chart reviewed inclusive of vital signs, progress notes, laboratory results, and diagnostic images - CT (+)left parietal lobe mass.   I met with Yvonne [name], her daughter, SIL, and three close friends at bedside this morning.  Created space and opportunity for patients family to explore thoughts feelings and fears regarding current medical situation. Patients daughter shares that she met with Dr. Samtani this morning. She feels irregardless of Yvonne [name]'s present stay she recognizes the need for hospice support. She and I discussed options and she would like to meet with [facility]. Ideally she shares he mother would like to be home for her final phases of life.   We reviewed that this morning Yvonne [name] is having an increase in agitation episodes, I shared that I would reach out to the neurology team to gain insights on medications to give to provide symptom relief and also not overly sedate Yvonne [name].   Patient predominately seems upset about her inability to get up to the bathroom. It has been restated that there is a purewick on. She however is confused at the time of assessment.  We discussed options such as straight cath or foley catheter placement if agitation persists.   Discussed that Dr. Buckley plans to visit today between his clinic visits.   Questions and concerns addressed/Palliative Support Provided.    Objective Assessment: Vital Signs Vitals:   05/07/24 1000 05/07/24 1129  BP: 125/72 (!) 137/104  Pulse: 83   Resp: (!) 23 12  Temp:  98.3 F (36.8 C)  SpO2: 94% 95%    Intake/Output Summary (Last 24 hours) at 05/07/2024 1151 Last data filed at 05/07/2024 1149 Gross per 24 hour  Intake 25 ml  Output 111 ml  Net -86 ml   Last Weight  Most recent update: 05/06/2024  9:31 AM    Weight  68 kg (149 lb 14.6 oz)            Gen:  Elderly Caucasian F chronically ill appearing HEENT: moist mucous membranes CV: Regular rate and rhythm  PULM:  Breathing is nonlabored ABD: soft/nontender  EXT: No edema  Neuro:  Awake - disoriented  SUMMARY OF RECOMMENDATIONS   DNAR   Patients family would like to hear from Dr. Buckley in regards to his recommendations moving forward  Patients daughter is in agreement with meeting with [facility]  Agitation episodes - have reached out to neurology written for 1x dose of ativan  IVP awaiting further insights   Ongoing PMT support ______________________________________________________________________________________ Rosaline Becton [facility] Palliative Medicine Team Team Cell Phone: [phone] Please utilize secure chat with additional questions, if there is no response within 30 minutes please call the above phone number  Billing based on MDM: High  Palliative Medicine Team providers are available by phone from 7am to 7pm daily and can be reached through the team cell phone.  Should this patient require assistance outside of these  hours, please call the patient's attending physician.

## 2024-05-07 NOTE — Procedures (Addendum)
 Patient Name: Yvonne [name]  MRN: [id number]  Epilepsy Attending: Arlin MALVA Krebs  Referring Physician/Provider: Michaela Aisha SQUIBB, MD  Duration: 05/06/2024 1209 to 05/07/2024 1209   Patient history: 78 y.o. female hx of anxiety, left glioblastoma who presents for acute onset of right sided facial droop, and slurred speech. Patient appears to be having a focal seizure with right gaze preference with eyes and facial twitching. EEG to evaluate for seizure.   Level of alertness: lethargic, asleep   AEDs during EEG study: LEV, LCM, PHT, ativan    Technical aspects: This EEG study was done with scalp electrodes positioned according to the 10-20 International system of electrode placement. Electrical activity was reviewed with band pass filter of 1-70Hz , sensitivity of 7 uV/mm, display speed of 78mm/sec with a 60Hz  notched filter applied as appropriate. EEG data were recorded continuously and digitally stored.  Video monitoring was available and reviewed as appropriate.   Description: EEG showed continuous generalized and lateralized left hemisphere 3 to 6 Hz theta-delta slowing admixed with 15 to 18 Hz beta activity distributed symmetrically and diffusely. Sleep was characterized by vertex waves, sleep spindles (12 to 14 Hz), maximal frontocentral region. Spikes were noted in left fronto-centro-temporal region. Hyperventilation and photic stimulation were not performed.      ABNORMALITY - Spikes, left fronto-centro-temporal region.  - Continuous slow, generalized  and lateralized left hemisphere   IMPRESSION: This study showed evidence of epileptogenicity arising from   left fronto-centro-temporal region. Additionally there is cortical dysfunction arising from left hemisphere likely secondary to underlying structural abnormality/ tumor. Additionally there is moderate diffuse encephalopathy. No seizures were seen throughout the recording.   Of note, focal motor seizures may not seen on scalp  EEG. Clinical correlation is recommended.     [name]

## 2024-05-07 NOTE — Progress Notes (Signed)
 LTM VIDEO EEG discontinued - no skin breakdown at [facility].

## 2024-05-07 NOTE — Progress Notes (Signed)
 NEUROLOGY CONSULT FOLLOW UP NOTE   Date of service: May 07, 2024 Patient Name: Yvonne [name] MRN:  [id number] DOB:  [birth year]  Interval Hx/subjective   She is slightly improved from yesterday, reportedly mouthed I love you but I am unable to get her to speak   Vitals   Vitals:   05/07/24 1300 05/07/24 1314 05/07/24 1430 05/07/24 1551  BP: 138/71  123/68 128/69  Pulse: 93 (!) 108 88 86  Resp: (!) 25 18  19   Temp:   98 F (36.7 C) 98 F (36.7 C)  TempSrc:   Oral Oral  SpO2: 93% 96% 94% 94%  Weight:      Height:         Body mass index is 25.73 kg/m.  Physical Exam   Constitutional: Appears well-developed and well-nourished.  Neurologic Examination    MS: Eyes are open, she fixates and follows with her daughter, she does not reliably follow commands CN: Pupils are reactive, she crosses midline in both directions Motor: She withdraws to noxious stimulation in all four extremities Sensory: As above  Medications  Current Facility-Administered Medications:    0.9 %  sodium chloride  infusion, , Intravenous, Continuous, Yvonne [name], Jai-Gurmukh, MD, Last Rate: 40 mL/hr at 05/07/24 1739, Infusion Verify at 05/07/24 1739   acetaminophen  (OFIRMEV ) IV 1,000 mg, 1,000 mg, Intravenous, Q4H, Yvonne [name], Jai-Gurmukh, MD, Stopped at 05/07/24 1708   Chlorhexidine  Gluconate Cloth 2 % PADS 6 each, 6 each, Topical, Daily, Yvonne [name], Jai-Gurmukh, MD, 6 each at 05/07/24 1600   dexamethasone  (DECADRON ) injection 4 mg, 4 mg, Intravenous, Q12H, Yvonne [name], Jai-Gurmukh, MD, 4 mg at 05/07/24 0941   lacosamide  (VIMPAT ) 100 mg in sodium chloride  0.9 % 25 mL IVPB, 100 mg, Intravenous, Q12H, Yvonne Yvonne SQUIBB, MD, Stopped at 05/07/24 1010   levETIRAcetam  (KEPPRA ) undiluted injection 1,000 mg, 1,000 mg, Intravenous, Q12H, Yvonne Yvonne SQUIBB, MD, 1,000 mg at 05/07/24 0941   LORazepam  (ATIVAN ) injection 0.25 mg, 0.25 mg, Intravenous, Q4H PRN, Yvonne [name], Jai-Gurmukh, MD  Facility-Administered  Medications Ordered in Other Encounters:    influenza vaccine adjuvanted (FLUAD) injection 0.5 mL, 0.5 mL, Intramuscular, Once, Yvonne [name], Yvonne K, MD  Labs and Diagnostic Imaging   Creatinine 0.76 Glucose 142  Imaging(Personally reviewed): CT head-tumor in the left parietal region  Assessment   Yvonne [name] is a 78 y.o. female with glioblastoma who presented in status epilepticus.  She broke following Keppra  and Dilantin , I am continuing Keppra  and Vimpat  since she was already on the Vimpat  at home.  Family is leaning towards hospice but wish to discuss it with Yvonne [name].  Her scan did not reveal much in the way of edema, she is on a low-dose of steroid and I would continue this but not sure we would get much benefit from high-dose.  Recommendations  Continue Keppra  1 g twice daily Continue Vimpat  100 mg twice daily Continue Decadron  Neurology will follow ______________________________________________________________________   Signed, Yvonne Michaela, MD [facility]

## 2024-05-07 NOTE — Progress Notes (Signed)
 LTM maint complete - no skin breakdown seen Serviced P3 , repositioned Cart to see the patient fully.  Not monitored by [facility] while in the ER.

## 2024-05-08 DIAGNOSIS — C713 Malignant neoplasm of parietal lobe: Secondary | ICD-10-CM | POA: Diagnosis not present

## 2024-05-08 DIAGNOSIS — G40909 Epilepsy, unspecified, not intractable, without status epilepticus: Secondary | ICD-10-CM | POA: Diagnosis not present

## 2024-05-08 DIAGNOSIS — R569 Unspecified convulsions: Secondary | ICD-10-CM | POA: Diagnosis not present

## 2024-05-08 MED ORDER — ACETAMINOPHEN 10 MG/ML IV SOLN
1000.0000 mg | Freq: Once | INTRAVENOUS | Status: DC
Start: 2024-05-08 — End: 2024-05-09
  Filled 2024-05-08: qty 100

## 2024-05-08 MED ORDER — ACETAMINOPHEN 325 MG PO TABS: 650.0000 mg | ORAL_TABLET | Freq: Four times a day (QID) | ORAL | Status: DC | PRN

## 2024-05-08 MED ORDER — SODIUM CHLORIDE 0.9 % IV SOLN: INTRAVENOUS | Status: DC

## 2024-05-08 NOTE — Evaluation (Signed)
 Occupational Therapy Evaluation Patient Details Name: Yvonne [name] MRN: [id number] DOB: 1946/07/25 Today's Date: 05/08/2024   History of Present Illness   Pt 78 y.o. female presents to [facility] 05/06/24 with R sided facial droop, worsening aphasia, and R gaze. CT head showed mixed attenuation mass L parietal lobe, hx of GMB. EEG showed epileptogenicity from L fronto-centro-temporal region and cortical dysfunction in L hemisphere likely 2/2 tumor as well as moderate diffuse encephalopathy. 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     Clinical Impressions PTA patient independent with ADLs and mobility.  Admitted for above and presents with problem list below.  Pt currently requires max-total assist +2 for bed mobility, max-total assist for ADLs and mod assist +2 to transition into standing.  She has limited verbalizations, but family reports baseline aphasia.  Pt unable to state birthday or place, appears confused on top of worsening aphasia.  Pt able to follow 1 step commands with increased time but inconsistent, responds better when her name is stated first.  She is not using Ues functionally, but moving L UE more than R UE.  Will follow acutely and determine dc recommendations pending progress.       If plan is discharge home, recommend the following:   Two people to help with walking and/or transfers;Two people to help with bathing/dressing/bathroom;Assistance with cooking/housework;Direct supervision/assist for medications management;Direct supervision/assist for financial management;Help with stairs or ramp for entrance;Assist for  transportation;Supervision due to cognitive status;Assistance with feeding     Functional Status Assessment   Patient has had a recent decline in their functional status and demonstrates the ability to make significant improvements in function in a reasonable and predictable amount of time.     Equipment Recommendations   BSC/3in1;Wheelchair (measurements OT);Wheelchair cushion (measurements OT)     Recommendations for Other Services         Precautions/Restrictions   Precautions Precautions: Fall Recall of Precautions/Restrictions: Impaired Restrictions Weight Bearing Restrictions Per Provider Order: No     Mobility Bed Mobility Overal bed mobility: Needs Assistance Bed Mobility: Supine to Sit, Sit to Supine     Supine to sit: Max assist, +2 for physical assistance Sit to supine: Total assist, +2 for physical assistance   General bed mobility comments: pt assisting with LB minimally but overall needng max assist +2 for LB, trunk and scooting forward    Transfers Overall transfer level: Needs assistance Equipment used: 2 person hand held assist Transfers: Sit to/from Stand Sit to Stand: Mod assist, +2 physical assistance           General transfer comment: posterior bias      Balance Overall balance assessment: Needs assistance Sitting-balance support: No upper extremity supported, Feet supported Sitting balance-Leahy Scale: Poor Sitting balance - Comments: post/R lateral lean Postural control: Posterior lean, Right lateral lean Standing balance support: Bilateral upper extremity supported, During functional activity Standing balance-Leahy Scale: Poor Standing balance comment: posterior and R lateral lean, needing UE and external support                           ADL either performed or assessed with clinical judgement   ADL Overall ADL's : Needs assistance/impaired  Grooming: Sitting;Maximal assistance Grooming Details (indicate  cue type and reason): hand over hand assisted with L UE         Upper Body Dressing : Total assistance;Sitting   Lower Body Dressing: Total assistance;+2 for physical assistance;Sit to/from Market researcher Details (indicate cue type and reason): deferred         Functional mobility during ADLs: Total assistance;Moderate assistance;+2 for physical assistance;Maximal assistance General ADL Comments: pt stood at EOB, marched in place and then layed back down     Vision   Vision Assessment?: No apparent visual deficits Additional Comments: pt able to open eyes but difficult to assess due to cognition. assess further as needed     Perception         Praxis         Pertinent Vitals/Pain Pain Assessment Pain Assessment: Faces Faces Pain Scale: No hurt Pain Intervention(s): Monitored during session     Extremity/Trunk Assessment Upper Extremity Assessment Upper Extremity Assessment: RUE deficits/detail;LUE deficits/detail;Difficult to assess due to impaired cognition;Right hand dominant RUE Deficits / Details: not using UE functionally, family reports slowly decreased functional use of R UE and at times increased tone?  Increased rigitidy noted, but hand relaxes at end of session.  difficult to assess. RUE Coordination: decreased fine motor;decreased gross motor LUE Deficits / Details: pt able to raise arm to shoulder level, grossly 3+/5 but not using functionally. difficult to assess LUE Coordination: decreased gross motor;decreased fine motor   Lower Extremity Assessment Lower Extremity Assessment: Defer to PT evaluation       Communication Communication Communication: Impaired Factors Affecting Communication: Reduced clarity of speech;Difficulty expressing self   Cognition Arousal: Lethargic Behavior During Therapy: Flat affect Cognition: Difficult to assess Difficult to assess due to: Impaired communication           OT - Cognition Comments: pt  with hx of aphasia per family, pt alert but lethargic.  She follows some simple 1 step commands with incresaed time. Poor initation. Does better following commands with her name stated first.                 Following commands: Impaired Following commands impaired: Follows one step commands inconsistently, Follows one step commands with increased time     Cueing  General Comments   Cueing Techniques: Verbal cues;Visual cues;Tactile cues;Gestural cues  family at side and supportive   Exercises     Shoulder Instructions      Home Living Family/patient expects to be discharged to:: Private residence Living Arrangements: Spouse/significant other Available Help at Discharge: Friend(s);Family;Available PRN/intermittently Type of Home: House Home Access: Ramped entrance     Home Layout: One level     Bathroom Shower/Tub: Tub/shower unit;Walk-in shower   Bathroom Toilet: Standard     Home Equipment: Shower seat          Prior Functioning/Environment Prior Level of Function : Independent/Modified Independent;History of Falls (last six months)             Mobility Comments: Ind with no AD, maybe 2 falls ADLs Comments: Ind    OT Problem List: Decreased strength;Decreased range of motion;Decreased activity tolerance;Decreased coordination;Decreased safety awareness;Decreased knowledge of use of DME or AE;Pain;Decreased knowledge of precautions;Impaired sensation;Impaired tone;Impaired UE functional use;Decreased cognition;Impaired vision/perception;Impaired balance (sitting and/or standing)   OT Treatment/Interventions: Self-care/ADL training;Therapeutic exercise;DME and/or AE instruction;Therapeutic activities;Cognitive remediation/compensation;Patient/family education;Visual/perceptual remediation/compensation;Balance training;Splinting;Neuromuscular education;Energy conservation      OT Goals(Current goals can be found in the care  plan section)   Acute Rehab OT  Goals Patient Stated Goal: none stated OT Goal Formulation: Patient unable to participate in goal setting Time For Goal Achievement: 05/22/24 Potential to Achieve Goals: Fair   OT Frequency:  Min 2X/week    Co-evaluation PT/OT/SLP Co-Evaluation/Treatment: Yes Reason for Co-Treatment: For patient/therapist safety;To address functional/ADL transfers;Necessary to address cognition/behavior during functional activity PT goals addressed during session: Mobility/safety with mobility OT goals addressed during session: ADL's and self-care      AM-PAC OT 6 Clicks Daily Activity     Outcome Measure Help from another person eating meals?: Total Help from another person taking care of personal grooming?: Total Help from another person toileting, which includes using toliet, bedpan, or urinal?: Total Help from another person bathing (including washing, rinsing, drying)?: Total Help from another person to put on and taking off regular upper body clothing?: Total Help from another person to put on and taking off regular lower body clothing?: Total 6 Click Score: 6   End of Session Nurse Communication: Mobility status  Activity Tolerance: Patient limited by fatigue Patient left: in bed;with call bell/phone within reach;with bed alarm set;with family/visitor present  OT Visit Diagnosis: Other abnormalities of gait and mobility (R26.89);Muscle weakness (generalized) (M62.81);Other symptoms and signs involving the nervous system (R29.898);Other symptoms and signs involving cognitive function;Cognitive communication deficit (R41.841) Symptoms and signs involving cognitive functions: Other cerebrovascular disease                Time: 9068-9046 OT Time Calculation (min): 22 min Charges:  OT General Charges $OT Visit: 1 Visit OT Evaluation $OT Eval Moderate Complexity: 1 Mod  Etta NOVAK, OT Acute Rehabilitation Services Office [phone] Secure Chat Preferred    Etta GORMAN Hope 05/08/2024, 10:48 AM

## 2024-05-08 NOTE — Evaluation (Signed)
 Clinical/Bedside Swallow Evaluation Patient Details  Name: PEONY BARNER MRN: [id number] Date of Birth: 12/09/1945  Today's Date: 05/08/2024 Time: SLP Start Time (ACUTE ONLY): 9163 SLP Stop Time (ACUTE ONLY): 0853 SLP Time Calculation (min) (ACUTE ONLY): 17 min  Past Medical History:  Past Medical History:  Diagnosis Date   Anxiety    Chest pressure    Degenerative arthritis    Deviated septum    Diabetes mellitus without complication (HCC)    diet controlled   Diabetic neuropathy (HCC) 12/03/2017   DJD (degenerative joint disease) of knee    Family history of bladder cancer    Family history of brain cancer    Family history of breast cancer    Family history of ovarian cancer    Family history of prostate cancer    Family history of stomach cancer    Family history of throat cancer    Family history of uterine cancer    Graves disease    RADIOACTIVE IODINE 1983   H/O hiatal hernia    Heart palpitations    History of diastolic dysfunction    per echo in 2011   Hyperlipidemia    Hypertension    Hypothyroidism    LVH (left ventricular hypertrophy)    per echo in 2011   Mitral valve prolapse    Neuropathy, peripheral    DR. JENEL 12/15   Normal nuclear stress test 2011   OSA (obstructive sleep apnea)    Polyneuropathy in other diseases classified elsewhere 05/21/2013   Rosacea 2019   MELANOMA CHEST REMOVED IN SITU   Sleep difficulties    Tinnitus    Past Surgical History:  Past Surgical History:  Procedure Laterality Date   ABDOMINAL HYSTERECTOMY  2000   TOTAL   APPENDECTOMY  1989   Brain biospy     CARDIOVASCULAR STRESS TEST  11/09/2009   EF 75%   CARPAL TUNNEL RELEASE  2002   bilateral   COLONOSCOPY WITH PROPOFOL  N/A 10/23/2018   Procedure: COLONOSCOPY WITH PROPOFOL ;  Surgeon: Teressa Toribio SQUIBB, MD;  Location: [facility];  Service: Endoscopy;  Laterality: N/A;   crainectomy     cranectomy     ESOPHAGOGASTRODUODENOSCOPY N/A 11/16/2021   Procedure:  ESOPHAGOGASTRODUODENOSCOPY (EGD);  Surgeon: Teressa Toribio SQUIBB, MD;  Location: [facility];  Service: Endoscopy;  Laterality: N/A;   ESOPHAGOGASTRODUODENOSCOPY (EGD) WITH PROPOFOL  N/A 10/23/2018   Procedure: ESOPHAGOGASTRODUODENOSCOPY (EGD) WITH PROPOFOL ;  Surgeon: Teressa Toribio SQUIBB, MD;  Location: [facility];  Service: Endoscopy;  Laterality: N/A;   EUS N/A 10/23/2018   Procedure: UPPER ENDOSCOPIC ULTRASOUND (EUS) RADIAL;  Surgeon: Teressa Toribio SQUIBB, MD;  Location: [facility];  Service: Endoscopy;  Laterality: N/A;   EUS N/A 11/16/2021   Procedure: UPPER ENDOSCOPIC ULTRASOUND (EUS) RADIAL;  Surgeon: Teressa Toribio SQUIBB, MD;  Location: [facility];  Service: Endoscopy;  Laterality: N/A;   MASS EXCISION Left 06/02/2019   Procedure: EXCISION OF SUBCUTANEOUS ABDOMINAL WALL MASSES LEFT LOWER QUADRANT;  Surgeon: Belinda Cough, MD;  Location: [facility];  Service: General;  Laterality: Left;   TONSILLECTOMY  1958   TRANSTHORACIC ECHOCARDIOGRAM  11/01/2009   NORMAL LV FILLING AND DIASTOLIC DYSFUNCTION AND MILD AORTIC SCLEROSIS AND TRACE MITRAL REGURGITATION   HPI:  Tala Eber is a 78 y.o. female who presented to ED with right facial asymmetry and concern for seizure. PMHx left temporal glioblastoma, crani/resection 04/18/22 with subsequent chemo/radiation, Graves dz, DM, aphasia with prior OP speech therapy Jan-March of 2024.    Assessment / Plan /  Recommendation  Clinical Impression  Pt drowsy but awake exhibitng significant cognitive and possible motor planning impairments in post-ictal state preventing pt from transiting po's at present. She held mouth open when spoon, cup presented to mouth and unable to close with max cues with all material spilling from oral cavity  and oral cavity cleaned. She was able to take one sip via straw sip water without s/s aspiration. Daughter states pt eating regular texture/thin prior to admit. Acute impairments likely due to post-ictal state. Should stay NPO with oral  care and ST will continue to see. Daughter (is an Charity fundraiser) stated once she is able to take po's she is willing to accept aspiration risk if found to have and would like pt to have home Hospice services. SLP Visit Diagnosis: Dysphagia, unspecified (R13.10)    Aspiration Risk  Mild aspiration risk    Diet Recommendation NPO         Other  Recommendations Oral Care Recommendations: Oral care QID     Assistance Recommended at Discharge    Functional Status Assessment Patient has had a recent decline in their functional status and demonstrates the ability to make significant improvements in function in a reasonable and predictable amount of time.  Frequency and Duration min 2x/week  2 weeks       Prognosis Prognosis for improved oropharyngeal function: Fair Barriers to Reach Goals: Cognitive deficits      Swallow Study   General Date of Onset: 05/08/24 HPI: Elif Yonts is a 78 y.o. female who presented to ED with right facial asymmetry and concern for seizure. PMHx left temporal glioblastoma, crani/resection 04/18/22 with subsequent chemo/radiation, Graves dz, DM, aphasia with prior OP speech therapy Jan-March of 2024. Type of Study: Bedside Swallow Evaluation Previous Swallow Assessment:  (none) Diet Prior to this Study: NPO Temperature Spikes Noted: No Respiratory Status: Room air History of Recent Intubation: No Behavior/Cognition: Cooperative;Pleasant mood;Requires cueing (little drowsy but awake) Oral Cavity Assessment: Dry Oral Care Completed by SLP: Yes Oral Cavity - Dentition: Adequate natural dentition Vision: Functional for self-feeding Self-Feeding Abilities: Needs assist Patient Positioning: Upright in bed Baseline Vocal Quality: Low vocal intensity Volitional Cough: Weak    Oral/Motor/Sensory Function Overall Oral Motor/Sensory Function: Mild impairment Facial ROM: Reduced right;Suspected CN VII (facial) dysfunction Facial Symmetry: Abnormal symmetry right;Suspected  CN VII (facial) dysfunction Facial Strength: Reduced right;Suspected CN VII (facial) dysfunction Lingual ROM:  (decreased protrusion) Lingual Symmetry:  (appears midline)   Ice Chips Ice chips: Not tested   Thin Liquid Thin Liquid: Impaired Presentation: Cup;Spoon;Straw Oral Phase Impairments: Poor awareness of bolus;Reduced labial seal Oral Phase Functional Implications: Left anterior spillage;Right anterior spillage Pharyngeal  Phase Impairments:  (one swallow with straw without s/s aspiration)    Nectar Thick Nectar Thick Liquid: Not tested   Honey Thick Honey Thick Liquid: Not tested   Puree Puree: Impaired Presentation: Spoon Oral Phase Impairments: Reduced labial seal;Poor awareness of bolus Oral Phase Functional Implications: Oral holding Pharyngeal Phase Impairments:  (no swallow ad po removed)   Solid     Solid: Not tested      Dustin Olam Bull 05/08/2024,9:19 AM

## 2024-05-08 NOTE — Progress Notes (Signed)
 NEUROLOGY CONSULT FOLLOW UP NOTE   Date of service: May 08, 2024 Patient Name: ADDA STOKES MRN:  [id number] DOB:  1946/04/30  Interval Hx/subjective   She is more alert today, able to say few words   Vitals   Vitals:   05/07/24 2003 05/07/24 2338 05/08/24 0340 05/08/24 0757  BP: 132/71 134/64 (!) 157/81 (!) 164/73  Pulse: 79 69 83 84  Resp: 18 18 18 18   Temp: 97.8 F (36.6 C) 98 F (36.7 C) 98.1 F (36.7 C) 97.6 F (36.4 C)  TempSrc: Oral Oral Oral Oral  SpO2: 96% 97% 95% 95%  Weight:      Height:         Body mass index is 25.73 kg/m.  Physical Exam   Constitutional: Appears well-developed and well-nourished.  Neurologic Examination    MS: Eyes are open, she follows commands, but requires repeated prompting able to say a few words and does engage with the examiner CN: Pupils are reactive, she crosses midline in both directions Motor: She moves all extremities spontaneously and to command Sensory: As above  Medications  Current Facility-Administered Medications:    acetaminophen  (OFIRMEV ) IV 1,000 mg, 1,000 mg, Intravenous, Q4H, Samtani, Jai-Gurmukh, MD, Last Rate: 400 mL/hr at 05/08/24 0613, 1,000 mg at 05/08/24 9386   Chlorhexidine  Gluconate Cloth 2 % PADS 6 each, 6 each, Topical, Daily, Samtani, Jai-Gurmukh, MD, 6 each at 05/07/24 1600   dexamethasone  (DECADRON ) injection 4 mg, 4 mg, Intravenous, Q12H, Samtani, Jai-Gurmukh, MD, 4 mg at 05/07/24 2155   lacosamide  (VIMPAT ) 100 mg in sodium chloride  0.9 % 25 mL IVPB, 100 mg, Intravenous, Q12H, Michaela Aisha SQUIBB, MD, Last Rate: 70 mL/hr at 05/07/24 2215, 100 mg at 05/07/24 2215   levETIRAcetam  (KEPPRA ) undiluted injection 1,000 mg, 1,000 mg, Intravenous, Q12H, Michaela Aisha SQUIBB, MD, 1,000 mg at 05/07/24 2154   LORazepam  (ATIVAN ) injection 0.25 mg, 0.25 mg, Intravenous, Q4H PRN, Samtani, Jai-Gurmukh, MD  Facility-Administered Medications Ordered in Other Encounters:    influenza vaccine  adjuvanted (FLUAD) injection 0.5 mL, 0.5 mL, Intramuscular, Once, Vaslow, Zachary K, MD  Labs and Diagnostic Imaging    Imaging(Personally reviewed): CT head-tumor in the left parietal region  Assessment   MARKASIA CARROL is a 78 y.o. female with glioblastoma who presented in status epilepticus.  She broke following Keppra  and Dilantin , I am continuing Keppra  and Vimpat  since she was already on the Vimpat  at home.  Gradually improving, but her prognosis is still rather poor.  I would continue discussions with palliative care and given how difficult it was to stop her seizure, I would continue her antiepileptics at the current doses even as a move towards comfort care.  Recommendations  Continue Keppra  1 g twice daily Continue Vimpat  100 mg twice daily Continue Decadron  Neurology will follow ______________________________________________________________________   Signed, Aisha Michaela, MD [facility]

## 2024-05-08 NOTE — Plan of Care (Signed)
 Family said they are very pleased that she has been awake and talking much of the day with family and friends.  She is sitting up in the bed.  No signs of distress.    Problem: Coping: Goal: Level of anxiety will decrease Outcome: Progressing   Problem: Elimination: Goal: Will not experience complications related to urinary retention Outcome: Progressing   Problem: Elimination: Goal: Will not experience complications related to bowel motility Outcome: Progressing   Problem: Pain Managment: Goal: General experience of comfort will improve and/or be controlled Outcome: Progressing   Problem: Safety: Goal: Ability to remain free from injury will improve Outcome: Progressing

## 2024-05-08 NOTE — Progress Notes (Signed)
   Met at bedside yesterday with the pt and her family. Discussed hospice services and how we would be able to assist at home or at our inpatient facility. They are hoping for another 24/48 hours to see how the pt responds. If she is able to eat and take meds by mouth they are hoping to be able to take her home with hospice care.   We will continue to follow and assist with d/c plan once we see how pt does over next 24-48 hours.   Magdalena Berber RN [phone]

## 2024-05-08 NOTE — TOC CM/SW Note (Signed)
 Transition of Care [facility]) - Inpatient Brief Assessment   Patient Details  Name: Yvonne [name] MRN: [id number] Date of Birth: April 28, 1946  [facility] CM/SW Contact:    Andrez JULIANNA George, RN Phone Number: 05/08/2024, 11:19 AM   Clinical Narrative:  Yvonne [name] is a 78 y.o. 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  She is from home with her spouse. Palliative care following.  Plan is to give her a few days to she how she progresses.  [facility] on board and following.   IP care management following.  Transition of Care Asessment: Insurance and Status: Insurance coverage has been reviewed Patient has primary care physician: Yes Home environment has been reviewed: home with spouse   Prior/Current Home Services: No current home services Social Drivers of Health Review: SDOH reviewed no interventions necessary Readmission risk has been reviewed: Yes Transition of care needs: transition of care needs identified, TOC will continue to follow

## 2024-05-08 NOTE — Progress Notes (Signed)
 [facility]   ROUNDING   NOTE Yvonne [name] FMW:[id number]  DOB: Dec 23, 1945  DOA: 05/06/2024  PCP: Yvonne Planas, MD  05/08/2024,1:46 PM  LOS: 1 day    Code Status: DNR currently     from: Home   78 year old female WHO stage IV glioblastoma temporal lobe on prior Atezolizumab/Avastin-recent infusion 04/22/2024-follows also with Yvonne [name] clinical study MO AB --Yvonne [name] Recent progression on MRI 04/22/2024 On prophylactic Vimpat  50 twice daily at home Diabetes, Graves' disease, diabetic neuropathy, OSA  9/25 [facility] ED eval for refractory partial status-acute right-sided facial droop slurred speech at the restaurant that she was at with her family sudden right facial twisting right gaze drooling code stroke activated-Ativan  given CT = mixed attenuation mass left parietal lobe increased in size from prior CT Keppra  4 g load +1.36 g fosphenytoin --patient stopped seizing during fosphenytoin  load Also loaded with Decadron  10 now on 2 mg orally daily Decision made to pursue EEG  made DNR in ED Further conversations ongoing with palliative care/neuro oncologist It seems family is interested in seeing clinical status over the next several days and then we will make a decision about disposition i.e. hospice at facility versus home hospice vs circumscribe treatment options  Labs on admit Sodium 137 potassium 4.0 chloride 102 BUN/creatinine 15/0.6 LFTs normal WBC 8.0 hemoglobin 14 platelet 218   Imaging CT head code stroke mixed attenuation mass left parietal lobe = now 3.9 X3.4 (previous 2.6 X2.1) denser along periphery with smaller necrotic central core-postsurgical encephalomalacia left temporal lobe EEG suggestive cortical dysfunction left hemisphere secondary to structural abnormality/tumor.   LT EEG suggestive epileptogenicity left frontal central temporal region with cortical dysfunction from left hemisphere   Assessment  & Plan :     Refractory status on admission now controlled secondary to  glioblastoma Decadron  4 mg iv every 12, suppressive Vimpat  100 IV twice daily Keppra  1 g twice daily IV--defer transition to orals until able to eat and will need to continue these medications lifelong Unsafe to eat-SLP eval 9/26 reveals n.p.o. for now-continue NS 40 cc/H WOULD NOT SEDATE completely--- goal is for patient to be more interactive to voice her wishes if able Follow EEG and neuro advise going forward High-grade glioblastoma poor prognosis overall Daughter HCPOA confirms intent likely for hospice but is willing to consider/reconsider oral chemo if she comes back much closer to premorbid baseline Allowing natural history/timeframe for improvement over the next several days under guidance of neuro oncologist/palliative care By 9/28 we should have a clear idea based on my discussion with her in terms of how she is faring Diabetes mellitus with neuropathy Would not cover sugars unless elevated on be met-expect sugars will be a little high because is on steroids Jardiance 10 metformin 500 twice daily on hold Gabapentin  300 at bedtime on hold Would not check sugars aggressively for now OSA not on CPAP HTN Unsafe to eat-hold metoprolol  XL for now Graves' disease Holding levothyroxine  for now   Discussed with several family members including HCPOA Yvonne [name] [phone] and patient's spouse Yvonne [name] [phone]  Disposition Unclear at this time  Data Reviewed:   No labs today  DVT prophylaxis: SCD  Status is: Observation The patient will require care spanning > 2 midnights and should be moved to inpatient because:   Awaiting improvement in mental state     Current Dispo: Unclear     Subjective:   More responsive interactive says several words can recognize her daughter I have reviewed PT OT notes and nurse  notes    Objective + exam Vitals:   05/07/24 2338 05/08/24 0340 05/08/24 0757 05/08/24 1220  BP: 134/64 (!) 157/81 (!) 164/73 132/63  Pulse: 69 83  84 77  Resp: 18 18 18 20   Temp: 98 F (36.7 C) 98.1 F (36.7 C) 97.6 F (36.4 C) 97.6 F (36.4 C)  TempSrc: Oral Oral Oral   SpO2: 97% 95% 95% 95%  Weight:      Height:       Filed Weights   05/06/24 0900  Weight: 68 kg    More awake alert No icterus no pallor Pupils reactive Slightly slurred speech Abdomen soft no rebound No lower extremity edema reflexes 2/3 S1-S2 no murmur   Scheduled Meds:  Chlorhexidine  Gluconate Cloth  6 each Topical Daily   dexamethasone  (DECADRON ) injection  4 mg Intravenous Q12H   levETIRAcetam   1,000 mg Intravenous Q12H   Continuous Infusions:  lacosamide  (VIMPAT ) IV 100 mg (05/08/24 1009)    Time 20 minutes  Yvonne Grimes, MD  [facility]

## 2024-05-08 NOTE — Evaluation (Signed)
 Physical Therapy Evaluation Patient Details Name: Yvonne [name] MRN: [id number] DOB: 04-30-1946 Today's Date: 05/08/2024  History of Present Illness  Pt 78 y.o. female presents to [facility] 05/06/24 with R sided facial droop, worsening aphasia, and R gaze. CT head showed mixed attenuation mass L parietal lobe, hx of GMB. EEG showed epileptogenicity from L fronto-centro-temporal region and cortical dysfunction in L hemisphere likely 2/2 tumor as well as moderate diffuse encephalopathy. 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   Clinical Impression  History and home-setup taken from family as pt was oriented to self and unable to answer questions. PTA pt was independent for mobility with no AD with hx of at least two falls. In today's session, pt required MaxA-TotalAx2 for bed mobility and ModAx2 to stand with [facility]. Pt was able to weight shift with assist and slightly lift LE's off the ground. When sitting, pt had a right lateral and posterior lean requiring MaxA to correct. Will continue to follow and determine dc recommendations based goals of care and progress.         If plan is discharge home, recommend the following: A lot of help with walking and/or transfers;A lot of help with bathing/dressing/bathroom;Direct supervision/assist for medications management;Direct supervision/assist for financial management;Assist for transportation;Help with stairs or ramp for entrance;Assistance with cooking/housework   Can travel by private vehicle    No    Equipment Recommendations BSC/3in1;Wheelchair (measurements PT);Wheelchair cushion (measurements  PT);Hospital bed;Hoyer lift     Functional Status Assessment Patient has had a recent decline in their functional status and demonstrates the ability to make significant improvements in function in a reasonable and predictable amount of time.     Precautions / Restrictions Precautions Precautions: Fall Recall of Precautions/Restrictions: Impaired Restrictions Weight Bearing Restrictions Per Provider Order: No      Mobility  Bed Mobility Overal bed mobility: Needs Assistance Bed Mobility: Supine to Sit, Sit to Supine    Supine to sit: Max assist, +2 for physical assistance Sit to supine: Total assist, +2 for physical assistance   General bed mobility comments: pt assisting with LB minimally but overall needng max assist +2 for LB, trunk and scooting forward    Transfers Overall transfer level: Needs assistance Equipment used: 2 person hand held assist Transfers: Sit to/from Stand Sit to Stand: Mod assist, +2 physical assistance    General transfer comment: posterior bias, able to slightly lift LE's off the ground       Balance Overall balance assessment: Needs assistance Sitting-balance support: No upper extremity supported, Feet supported Sitting balance-Leahy Scale: Poor Sitting balance - Comments: post/R lateral lean Postural control: Posterior lean, Right lateral lean Standing balance support: Bilateral upper extremity supported, During functional activity Standing balance-Leahy Scale: Poor Standing balance comment: posterior and R lateral lean, needing UE and external support        Pertinent Vitals/Pain Pain Assessment Pain Assessment: No/denies pain Faces Pain Scale: No hurt Pain Intervention(s): Monitored during session    Home Living Family/patient expects to be discharged to:: Private residence Living Arrangements: Spouse/significant other Available Help at Discharge: Friend(s);Family;Available PRN/intermittently Type of Home: House Home Access:  Ramped entrance    Home Layout: One level Home Equipment: Shower seat  Prior Function Prior Level of Function : Independent/Modified Independent;History of Falls (last six months)    Mobility Comments: Ind with no AD, about two falls due to LOB ADLs Comments: Ind     Extremity/Trunk Assessment   Upper Extremity Assessment Upper Extremity Assessment: Defer to OT evaluation RUE Deficits / Details: not using UE functionally, family reports slowly decreased functional use of R UE and at times increased tone?  Increased rigitidy noted, but hand relaxes at end of session.  difficult to assess. RUE Coordination: decreased fine motor;decreased gross motor LUE Deficits / Details: pt able to raise arm to shoulder level, grossly 3+/5 but not using functionally. difficult to assess LUE Coordination: decreased gross motor;decreased fine motor    Lower Extremity Assessment Lower Extremity Assessment: Difficult to assess due to impaired cognition (WFL AROM, at least 3/5)       Communication   Communication Communication: Impaired Factors Affecting Communication: Reduced clarity of speech;Difficulty expressing self    Cognition Arousal: Lethargic Behavior During Therapy: Flat affect   PT - Cognitive impairments: Difficult to assess, Orientation Difficult to assess due to: Impaired communication Orientation impairments: Place, Time, Situation    PT - Cognition Comments: A&Ox1, occasionally able to respond but primarily nonverbal. Intermittent following of commands Following commands: Impaired Following commands impaired: Follows one step commands inconsistently, Follows one step commands with increased time     Cueing Cueing Techniques: Verbal cues, Visual cues, Tactile cues, Gestural cues     General Comments General comments (skin integrity, edema, etc.): Family present and supportive     PT Assessment Patient needs continued PT services  PT Problem List Decreased  strength;Decreased activity tolerance;Decreased balance;Decreased mobility;Decreased cognition;Decreased knowledge of use of DME;Decreased safety awareness;Decreased knowledge of precautions       PT Treatment Interventions DME instruction;Gait training;Functional mobility training;Therapeutic activities;Therapeutic exercise;Balance training;Neuromuscular re-education;Patient/family education    PT Goals (Current goals can be found in the Care Plan section)  Acute Rehab PT Goals Patient Stated Goal: unable to state goal PT Goal Formulation: Patient unable to participate in goal setting Time For Goal Achievement: 05/22/24 Potential to Achieve Goals: Fair    Frequency Min 2X/week     Co-evaluation   Reason for Co-Treatment: For patient/therapist safety;To address functional/ADL transfers;Necessary to address cognition/behavior during functional activity PT goals addressed during session: Mobility/safety with mobility OT goals addressed during session: ADL's and self-care       AM-PAC PT 6 Clicks Mobility  Outcome Measure Help needed turning from your back to your side while in a flat bed without using bedrails?: Total Help needed moving from lying on your back to sitting on the side of a flat bed without using bedrails?: Total Help needed moving to and from a bed to a chair (including a wheelchair)?: Total Help needed standing up from a chair using your arms (e.g., wheelchair or bedside chair)?: Total Help needed to walk in hospital room?: Total Help needed climbing 3-5 steps with a railing? : Total 6 Click Score: 6    End of Session   Activity Tolerance: Patient limited by fatigue Patient left: in bed;with call bell/phone within reach;with bed alarm set;with family/visitor present Nurse Communication: Mobility status PT Visit Diagnosis: Unsteadiness on feet (R26.81);Other abnormalities of gait and mobility (R26.89);Muscle weakness (generalized) (M62.81);History of falling  (Z91.81)    Time: 9068-9046 PT Time Calculation (min) (ACUTE ONLY): 22 min   Charges:   PT Evaluation $PT Eval Moderate Complexity: 1 Mod   PT General Charges $$ ACUTE PT VISIT:  1 Visit       Kate ORN, PT, DPT Secure Chat Preferred  Rehab Office [phone]   Kate BRAVO Wendolyn 05/08/2024, 11:28 AM

## 2024-05-09 DIAGNOSIS — G40909 Epilepsy, unspecified, not intractable, without status epilepticus: Secondary | ICD-10-CM | POA: Diagnosis not present

## 2024-05-09 DIAGNOSIS — R569 Unspecified convulsions: Secondary | ICD-10-CM | POA: Diagnosis not present

## 2024-05-09 DIAGNOSIS — C713 Malignant neoplasm of parietal lobe: Secondary | ICD-10-CM | POA: Diagnosis not present

## 2024-05-09 LAB — GLUCOSE, CAPILLARY
Glucose-Capillary: 205 mg/dL — ABNORMAL HIGH (ref 70–99)
Glucose-Capillary: 163 mg/dL — ABNORMAL HIGH (ref 70–99)
Glucose-Capillary: 207 mg/dL — ABNORMAL HIGH (ref 70–99)

## 2024-05-09 LAB — BASIC METABOLIC PANEL WITH GFR
Anion gap: 12 (ref 5–15)
BUN: 17 mg/dL (ref 8–23)
CO2: 19 mmol/L — ABNORMAL LOW (ref 22–32)
Calcium: 8.4 mg/dL — ABNORMAL LOW (ref 8.9–10.3)
Chloride: 106 mmol/L (ref 98–111)
Creatinine, Ser: 0.77 mg/dL (ref 0.44–1.00)
GFR, Estimated: >60 mL/min (ref >60)
Glucose, Bld: 195 mg/dL — ABNORMAL HIGH (ref 70–99)
Potassium: 3.3 mmol/L — ABNORMAL LOW (ref 3.5–5.1)
Sodium: 137 mmol/L (ref 135–145)

## 2024-05-09 MED ORDER — EMPAGLIFLOZIN 10 MG PO TABS
10.0000 mg | ORAL_TABLET | Freq: Every morning | ORAL | Status: DC
Start: 2024-05-10 — End: 2024-05-10
  Administered 2024-05-10: 10 mg via ORAL
  Filled 2024-05-09: qty 1

## 2024-05-09 MED ORDER — LEVOTHYROXINE SODIUM 88 MCG PO TABS
88.0000 ug | ORAL_TABLET | Freq: Every day | ORAL | Status: DC
  Administered 2024-05-10: 88 ug via ORAL
  Filled 2024-05-09: qty 1

## 2024-05-09 MED ORDER — LACOSAMIDE 50 MG PO TABS
100.0000 mg | ORAL_TABLET | Freq: Two times a day (BID) | ORAL | Status: DC
  Administered 2024-05-09 – 2024-05-10 (×2): 100 mg via ORAL
  Filled 2024-05-09 (×2): qty 2

## 2024-05-09 MED ORDER — LEVETIRACETAM 500 MG PO TABS
1000.0000 mg | ORAL_TABLET | Freq: Two times a day (BID) | ORAL | Status: DC
  Administered 2024-05-09 – 2024-05-10 (×2): 1000 mg via ORAL
  Filled 2024-05-09 (×2): qty 2

## 2024-05-09 MED ORDER — DEXAMETHASONE 2 MG PO TABS
2.0000 mg | ORAL_TABLET | Freq: Every day | ORAL | Status: DC
Start: 2024-05-10 — End: 2024-05-10
  Administered 2024-05-10: 2 mg via ORAL
  Filled 2024-05-09: qty 1

## 2024-05-09 NOTE — Progress Notes (Signed)
 [facility]   ROUNDING   NOTE Yvonne [name] FMW:[id number]  DOB: 24-Aug-1945  DOA: 05/06/2024  PCP: Cleotilde Planas, MD  05/09/2024,11:15 AM  LOS: 2 days    Code Status: DNR currently     from: Home   78 year old female WHO stage IV glioblastoma temporal lobe on prior Atezolizumab/Avastin-recent infusion 04/22/2024-follows also with [facility] clinical study MO AB --Dr. Esteban Recent progression on MRI 04/22/2024 On prophylactic Vimpat  50 twice daily at home Diabetes, Graves' disease, diabetic neuropathy, OSA  9/25 [facility] ED eval for refractory partial status-acute right-sided facial droop slurred speech at the restaurant that she was at with her family sudden right facial twisting right gaze drooling code stroke activated-Ativan  given CT = mixed attenuation mass left parietal lobe increased in size from prior CT Keppra  4 g load +1.36 g fosphenytoin --patient stopped seizing during fosphenytoin  load Also loaded with Decadron  10 now on 2 mg orally daily Decision made to pursue EEG  made DNR in ED Further conversations ongoing with palliative care/neuro oncologist It seems family is interested in seeing clinical status over the next several days and then we will make a decision about disposition i.e. hospice at facility versus home hospice vs circumscribe treatment options  Labs on admit Sodium 137 potassium 4.0 chloride 102 BUN/creatinine 15/0.6 LFTs normal WBC 8.0 hemoglobin 14 platelet 218   Imaging CT head code stroke mixed attenuation mass left parietal lobe = now 3.9 X3.4 (previous 2.6 X2.1) denser along periphery with smaller necrotic central core-postsurgical encephalomalacia left temporal lobe EEG suggestive cortical dysfunction left hemisphere secondary to structural abnormality/tumor.   LT EEG suggestive epileptogenicity left frontal central temporal region with cortical dysfunction from left hemisphere   Assessment  & Plan :    Refractory status on admission now controlled secondary to  glioblastoma Decadron  4 mg iv every 12--stop per Neuro to 2 mg every day orall Change AEDs to Vimpat  100 oral, Keppra  1 g oral twice daily Full diet DC saline High-grade glioblastoma poor prognosis overall Daughter HCPOA confirms intent likely for hospice  Engage hospice liaison to discuss planning Diabetes mellitus with neuropathy Would not cover sugars unless elevated on be met-expect sugars will be a little high because is on steroids Jardiance 10 metformin 500 twice daily on hold-would not resume Gabapentin  300 at bedtime on hold Would not check sugars aggressively going forward OSA not on CPAP HTN Resume metoprolol  may be low-dose as an outpatient resume Graves' disease Synthroid  resumed   Discussed with several family members including HCPOA [name] [phone] at bedside along with neurologist  Likely in 24 hours  Data Reviewed:   Await in a.m. labs  DVT prophylaxis: SCD  Status is: Observation The patient will require care spanning > 2 midnights and should be moved to inpatient because:   Awaiting improvement in mental state     Current Dispo: Unclear     Subjective:    Much more interactive coherent ate half an omelette verbalizing who she sees around her in the room and names them correctly No pain no fever Still could you check in with the daughter please has catheter   Objective + exam Vitals:   05/08/24 2059 05/09/24 0006 05/09/24 0357 05/09/24 0900  BP: (!) 132/57 137/66 123/67 139/65  Pulse: 80 77 82 81  Resp:  16 16 18   Temp: 98.3 F (36.8 C) 98.2 F (36.8 C) 97.8 F (36.6 C) 98.4 F (36.9 C)  TempSrc: Oral Axillary Axillary Oral  SpO2: 92% 94% 94% 95%  Weight:  Height:       Filed Weights   05/06/24 0900  Weight: 68 kg    Wake alert coherent x 2 Power 5/5 bilaterally S1-S2 no murmur Chest clear Abdomen soft no rebound Foley in place   Scheduled Meds:  Chlorhexidine  Gluconate Cloth  6 each Topical Daily    dexamethasone   2 mg Oral Daily   lacosamide   100 mg Oral BID   levETIRAcetam   1,000 mg Oral BID   [START ON 05/10/2024] levothyroxine   88 mcg Oral QAC breakfast   Continuous Infusions:    Time 20 minutes  Jai-Gurmukh Tylin Force, MD  [facility]

## 2024-05-09 NOTE — Progress Notes (Signed)
 Foley clamped per MD.   Education given to daughter at bedside who happens to be a Engineer, civil (consulting). Verb understanding.

## 2024-05-09 NOTE — Plan of Care (Signed)
   Problem: Education: Goal: Knowledge of General Education information will improve Description Including pain rating scale, medication(s)/side effects and non-pharmacologic comfort measures Outcome: Progressing

## 2024-05-09 NOTE — Plan of Care (Signed)

## 2024-05-09 NOTE — Plan of Care (Signed)

## 2024-05-09 NOTE — Progress Notes (Addendum)
 Unclamped foley after clamping trial. Pt had 135 cc residual and did not have the urge to void or complained of abd pain.

## 2024-05-09 NOTE — Progress Notes (Signed)
 NEUROLOGY CONSULT FOLLOW UP NOTE   Date of service: May 09, 2024 Patient Name: SAPPHIRE TYGART MRN:  [id number] DOB:  Jul 13, 1946  Interval Hx/subjective   She i continues to improve  Vitals   Vitals:   05/09/24 0006 05/09/24 0357 05/09/24 0900 05/09/24 1204  BP: 137/66 123/67 139/65 (!) 141/60  Pulse: 77 82 81 87  Resp: 16 16 18 16   Temp: 98.2 F (36.8 C) 97.8 F (36.6 C) 98.4 F (36.9 C) (!) 97.5 F (36.4 C)  TempSrc: Axillary Axillary Oral Oral  SpO2: 94% 94% 95% 93%  Weight:      Height:         Body mass index is 25.73 kg/m.  Physical Exam   Constitutional: Appears well-developed and well-nourished.  Neurologic Examination    MS: Eyes are open, she follows commands, but requires repeated prompting able to say a few words and does engage with the examiner CN: Pupils are reactive, she crosses midline in both directions Motor: She moves all extremities spontaneously and to command Sensory: As above  Medications  Current Facility-Administered Medications:    acetaminophen  (TYLENOL ) tablet 650 mg, 650 mg, Oral, Q6H PRN, Debby Camila LABOR, MD   Chlorhexidine  Gluconate Cloth 2 % PADS 6 each, 6 each, Topical, Daily, Samtani, Jai-Gurmukh, MD, 6 each at 05/09/24 0811   [START ON 05/10/2024] dexamethasone  (DECADRON ) tablet 2 mg, 2 mg, Oral, Daily, Samtani, Jai-Gurmukh, MD   lacosamide  (VIMPAT ) tablet 100 mg, 100 mg, Oral, BID, Samtani, Jai-Gurmukh, MD   levETIRAcetam  (KEPPRA ) tablet 1,000 mg, 1,000 mg, Oral, BID, Samtani, Jai-Gurmukh, MD   [START ON 05/10/2024] levothyroxine  (SYNTHROID ) tablet 88 mcg, 88 mcg, Oral, QAC breakfast, Samtani, Jai-Gurmukh, MD  Facility-Administered Medications Ordered in Other Encounters:    influenza vaccine adjuvanted (FLUAD) injection 0.5 mL, 0.5 mL, Intramuscular, Once, Vaslow, Zachary K, MD  Labs and Diagnostic Imaging    Imaging(Personally reviewed): CT head-tumor in the left parietal region  Assessment   TAHISHA HAKIM is a 78 y.o. female with glioblastoma who presented in status epilepticus.  She broke following Keppra  and Dilantin , I am continuing Keppra  and Vimpat  since she was already on the Vimpat  at home.  Gradually improving, but her prognosis is still rather poor.  I would continue discussions with palliative care and given how difficult it was to stop her seizure, I would continue her antiepileptics at the current doses even as a move towards comfort care.    Recommendations  Continue Keppra  1 g twice daily Continue Vimpat  100 mg twice daily Continue Decadron , there is not a lot of edema, and I think we can scale back fairly quickly. Change all medications to p.o. Family is still deciding on hospice, and leaning towards this.  Neurology will continue to be available as needed, please call if further questions or concerns. ______________________________________________________________________   Bonney Aisha Seals, MD [facility]

## 2024-05-09 NOTE — Progress Notes (Signed)
 Speech Language Pathology Treatment: Dysphagia  Patient Details Name: ANISE HARBIN MRN: [id number] DOB: [birth year] Today's Date: 05/09/2024 Time: 9194-9164 SLP Time Calculation (min) (ACUTE ONLY): 30 min  Assessment / Plan / Recommendation Clinical Impression  Pt needed assistance to eat today - she had symptoms of agnosia - not recognizing spoon for self feeding. Pt willing to accept po today and consumed approx 1 ounce applesauce, 3 graham crackers, 3 ounces juice and 3 ounces water - self feeding - with assist. She is right handed and having difficulty getting spoon to her mouth - but not transitioning to left hand. Pt consumed po rapidly - but is hungry given NPO since yesterday. Her mastication was timely with full oral clearance of all PO. No s/s of aspiration across all po noted and pt consumed po at rapid rate.   Pt had difficulty holding spoon with right hand and is not on OT caseload today - thus provided her with foam buildup for self feeding - which was very effective to improve abilities.   Recommend regular/thin diet with general precautions including assuring full clearance of right oral cavity and helping pt to eat at slow rate, educated pt and famliy to recommendations.   Suspect this acute dysphagia was due to her seizure as she is eating regular/thin diet at home without issues.  No SLP follow up needed.     HPI HPI: David Towson is a 78 y.o. female who presented to ED with right facial asymmetry and concern for seizure. PMHx left temporal glioblastoma, crani/resection 04/18/22 with subsequent chemo/radiation, Graves dz, DM, aphasia with prior OP speech therapy Jan-March of 2024.  Swallow eval ordered upon admit but pt found to unsafe with po intake at that time.  Follow up indicated for dysphagia eval.      SLP Plan  All goals met          Recommendations  Diet recommendations: Regular;Thin liquid Liquids provided via: Cup;Straw Medication Administration: Whole  meds with liquid Supervision: Trained caregiver to feed patient Compensations: Slow rate;Small sips/bites Postural Changes and/or Swallow Maneuvers: Seated upright 90 degrees;Upright 30-60 min after meal                  Oral care BID   None Dysphagia, oral phase (R13.11)     All goals met    Madelin POUR, MS [facility] SLP Acute Rehab Services Office [phone]  Nicolas Emmie Caldron  05/09/2024, 9:18 AM

## 2024-05-09 NOTE — Progress Notes (Signed)
 Occupational Therapy Treatment Patient Details Name: Yvonne [name] MRN: [id number] DOB: 12-31-1945 Today's Date: 05/09/2024   History of present illness Pt 78 y.o. female presents to [facility] 05/06/24 with R sided facial droop, worsening aphasia, and R gaze. CT head showed mixed attenuation mass L parietal lobe, hx of GMB. EEG showed epileptogenicity from L fronto-centro-temporal region and cortical dysfunction in L hemisphere likely 2/2 tumor as well as moderate diffuse encephalopathy. 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   OT comments  Patient demonstrating good gains with OT treatment from yesterday's visit. Patient able to get to EOB with min assist and able to perform mobility and transfers with min to mod assist due to assistance to stand and with balance. Patient able to stand at sink for grooming tasks with min assist and cues for sequencing. Patient able to perform mobility in hallway with assistance for balance and managing RW.  Discharge recommendations continue to be appropriate.  Acute OT to continue to follow to address established goals to facilitate DC to next venue of care.        If plan is discharge home, recommend the following:  A lot of help with walking and/or transfers;A lot of help with bathing/dressing/bathroom;Assistance with cooking/housework;Assistance with feeding;Direct supervision/assist for medications management;Direct supervision/assist for financial management;Assist for transportation;Help with stairs or ramp for entrance;Supervision due to cognitive status   Equipment Recommendations  BSC/3in1;Wheelchair  (measurements OT);Wheelchair cushion (measurements OT)    Recommendations for Other Services      Precautions / Restrictions Precautions Precautions: Fall Recall of Precautions/Restrictions: Impaired       Mobility Bed Mobility Overal bed mobility: Needs Assistance Bed Mobility: Supine to Sit     Supine to sit: Min assist     General bed mobility comments: patient able to bring BLEs to EOB and required min assist with trunk and scooting towards EOB    Transfers Overall transfer level: Needs assistance Equipment used: Rolling walker (2 wheels) Transfers: Sit to/from Stand Sit to Stand: Min assist, Mod assist           General transfer comment: cues for hand placement and min to mod assist for sit to stand and for balance     Balance Overall balance assessment: Needs assistance Sitting-balance support: No upper extremity supported, Feet supported Sitting balance-Leahy Scale: Poor Sitting balance - Comments: CGA to supervision on EOB   Standing balance support: Single extremity supported, Bilateral upper extremity supported, During functional activity Standing balance-Leahy Scale: Poor Standing balance comment: reliant on RW or sink for support                           ADL either performed or assessed with clinical judgement   ADL Overall ADL's : Needs assistance/impaired   Eating/Feeding Details (indicate cue type and reason): ST provided patient and family with red foam to assist with self feeding and family states she was able to feed self Grooming: Wash/dry hands;Wash/dry face;Oral care;Minimal assistance;Standing;Cueing for sequencing Grooming Details (indicate cue type and reason): required assistance for toothpaste and cues for sequencing         Upper Body Dressing : Minimal assistance;Sitting Upper Body Dressing Details (indicate cue  type and reason): gown change Lower Body Dressing: Maximal assistance;Sitting/lateral leans                Functional mobility during ADLs: Minimal assistance;Moderate assistance;Rolling walker (2 wheels) General ADL Comments: min to mod assist to stand and for balance with mobility    Extremity/Trunk Assessment              Vision       Perception     Praxis     Communication Communication Communication: Impaired Factors Affecting Communication: Reduced clarity of speech;Difficulty expressing self   Cognition Arousal: Alert Behavior During Therapy: Impulsive Cognition: Cognition impaired Difficult to assess due to: Impaired communication   Awareness: Intellectual awareness impaired, Online awareness impaired Memory impairment (select all impairments): Short-term memory Attention impairment (select first level of impairment): Sustained attention Executive functioning impairment (select all impairments): Initiation, Sequencing, Problem solving OT - Cognition Comments: patient more alert but requiring cues for sequencing and safety                 Following commands: Impaired Following commands impaired: Follows one step commands inconsistently, Follows one step commands with increased time      Cueing   Cueing Techniques: Verbal cues, Tactile cues  Exercises      Shoulder Instructions       General Comments Patient's daughter present during visit, who is a Engineer, civil (consulting), and was supportive    Pertinent Vitals/ Pain       Pain Assessment Pain Assessment: Faces Faces Pain Scale: No hurt Pain Intervention(s): Monitored during session  Home Living                                          Prior Functioning/Environment              Frequency  Min 2X/week        Progress Toward Goals  OT Goals(current goals can now be found in the care plan section)  Progress towards OT goals: Progressing toward goals  Acute Rehab OT Goals Patient Stated Goal: to move more OT Goal Formulation: With family Time For Goal Achievement:  05/22/24 Potential to Achieve Goals: Fair ADL Goals Pt Will Perform Grooming: with mod assist;sitting Pt Will Perform Upper Body Bathing: with mod assist;sitting Pt Will Transfer to Toilet: with mod assist;stand pivot transfer;bedside commode Additional ADL Goal #1: Pt will follow 1 step commands with increased time with 90% consistency during ADL routines. Additional ADL Goal #2: Pt will complete bed mobility with min assist as precursor to ADLs.  Plan      Co-evaluation                 AM-PAC OT 6 Clicks Daily Activity     Outcome Measure   Help from another person eating meals?: A Little Help from another person taking care of personal grooming?: A Little Help from another person toileting, which includes using toliet, bedpan, or urinal?: A Lot Help from another person bathing (including washing, rinsing, drying)?: A Lot Help from another person to put on and taking off regular upper body clothing?: A Lot Help from another person to put on and taking off regular lower body clothing?: A Lot 6 Click Score: 14    End of Session Equipment Utilized During Treatment: Gait belt;Rolling walker (2 wheels)  OT Visit Diagnosis: Other abnormalities of gait and mobility (R26.89);Muscle weakness (  generalized) (M62.81);Other symptoms and signs involving the nervous system (R29.898);Other symptoms and signs involving cognitive function;Cognitive communication deficit (R41.841) Symptoms and signs involving cognitive functions: Other cerebrovascular disease   Activity Tolerance Patient tolerated treatment well   Patient Left in chair;with call bell/phone within reach;with chair alarm set;with family/visitor present   Nurse Communication Mobility status        Time: 8871-8840 OT Time Calculation (min): 31 min  Charges: OT General Charges $OT Visit: 1 Visit OT Treatments $Self Care/Home Management : 8-22 mins $Therapeutic Activity: 8-22 mins  Dick [name], OTA Acute  Rehabilitation Services  Office [phone]   Yvonne [name] 05/09/2024, 1:55 PM

## 2024-05-10 ENCOUNTER — Other Ambulatory Visit (HOSPITAL_COMMUNITY): Payer: Self-pay

## 2024-05-10 ENCOUNTER — Encounter: Payer: Self-pay | Admitting: Internal Medicine

## 2024-05-10 DIAGNOSIS — Z7189 Other specified counseling: Secondary | ICD-10-CM | POA: Diagnosis not present

## 2024-05-10 DIAGNOSIS — Z515 Encounter for palliative care: Secondary | ICD-10-CM | POA: Diagnosis not present

## 2024-05-10 DIAGNOSIS — R569 Unspecified convulsions: Secondary | ICD-10-CM | POA: Diagnosis not present

## 2024-05-10 LAB — GLUCOSE, CAPILLARY: Glucose-Capillary: 140 mg/dL — ABNORMAL HIGH (ref 70–99)

## 2024-05-10 MED ORDER — LACOSAMIDE 100 MG PO TABS
100.0000 mg | ORAL_TABLET | Freq: Two times a day (BID) | ORAL | 0 refills | Status: DC
  Filled 2024-05-10: qty 60, 30d supply, fill #0

## 2024-05-10 MED ORDER — LEVETIRACETAM 1000 MG PO TABS
1000.0000 mg | ORAL_TABLET | Freq: Two times a day (BID) | ORAL | 0 refills | Status: DC
  Filled 2024-05-10: qty 60, 30d supply, fill #0

## 2024-05-10 MED ORDER — DEXAMETHASONE 2 MG PO TABS
2.0000 mg | ORAL_TABLET | Freq: Every day | ORAL | 0 refills | Status: DC
  Filled 2024-05-10: qty 30, 30d supply, fill #0

## 2024-05-10 MED ORDER — DIPHENHYDRAMINE-ZINC ACETATE 2-0.1 % EX CREA: TOPICAL_CREAM | Freq: Two times a day (BID) | CUTANEOUS | 0 refills | Status: DC | PRN

## 2024-05-10 MED ORDER — DIPHENHYDRAMINE-ZINC ACETATE 2-0.1 % EX CREA: TOPICAL_CREAM | Freq: Two times a day (BID) | CUTANEOUS | Status: DC | PRN

## 2024-05-10 NOTE — Discharge Summary (Signed)
 Physician Discharge Summary  Yvonne [name] FMW:[id number] DOB: 12-Dec-1945 DOA: 05/06/2024  PCP: Cleotilde Planas, MD  Admit date: 05/06/2024 Discharge date: 05/10/2024  Time spent: 60 minutes  Recommendations for Outpatient Follow-up:  Recommend ongoing discussions with [facility] cancer specialists, Dr. Buckley and hospice regarding further goals of care-going home with hospice of the [name] after equipment has been delivered Suggest simplification of meds as per below-would not aggressively controlled glycemia and or other long-term longevity goals  Discharge Diagnoses:  MAIN problem for hospitalization   Refractory status epilepticus secondary to nidus being glioblastoma now controlled Encounter for palliative care  Please see below for itemized issues addressed in HOpsital- refer to other progress notes for clarity if needed  Discharge Condition: Guarded  Diet recommendation: Comfort  Filed Weights   05/06/24 0900  Weight: 68 kg    History of present illness:  78 year old female WHO stage IV glioblastoma temporal lobe on prior Atezolizumab/Avastin-recent infusion 04/22/2024-follows also with [facility] clinical study [facility] --Dr. Esteban Recent progression on MRI 04/22/2024 On prophylactic Vimpat  50 twice daily at home Diabetes, Graves' disease, diabetic neuropathy, OSA   9/25 Yvonne [name] ED eval for refractory partial status-acute right-sided facial droop slurred speech at the restaurant that she was at with her family sudden right facial twisting right gaze drooling code stroke activated-Ativan  given CT = mixed attenuation mass left parietal lobe increased in size from prior CT Keppra  4 g load +1.36 g fosphenytoin --patient stopped seizing during fosphenytoin  load Also loaded with Decadron  10 now on 2 mg orally daily Decision made to pursue EEG   made DNR in ED Further conversations ongoing with palliative care/neuro oncologist It seems family is interested in seeing clinical status once he goes  home and then will decide on further plans as per previous extensive discussions   Labs on admit Sodium 137 potassium 4.0 chloride 102 BUN/creatinine 15/0.6 LFTs normal WBC 8.0 hemoglobin 14 platelet 218     Imaging CT head code stroke mixed attenuation mass left parietal lobe = now 3.9 X3.4 (previous 2.6 X2.1) denser along periphery with smaller necrotic central core-postsurgical encephalomalacia left temporal lobe EEG suggestive cortical dysfunction left hemisphere secondary to structural abnormality/tumor.   LT EEG suggestive epileptogenicity left frontal central temporal region with cortical dysfunction from left hemisphere  [name] Course:      Refractory status on admission now controlled secondary to glioblastoma cEEG as above showed epileptogenicity but no overt seizure but this was done after had been loaded with several AEDs Initially was on high-dose steroids as well as IV Keppra  and Vimpat  which attenuated seizures and controlled the same and was initially quite sleepy unsafe to eat-she is much improved-no further seizures Seems to be much closer to her premorbid baseline although still is a bit confused Discussed with neurology on several days in detail-can go home on Decadron  2 mg and oral Vimpat  100 twice daily as well as Keppra  twice daily 1 g and can continue full diet if awake and coherent See below High-grade glioblastoma poor prognosis overall Daughter HCPOA confirms intent likely for hospice but is willing to consider/reconsider oral chemo if she comes back much closer to premorbid baseline She seems improved over the past several days, patient is a little incoherent but seems to be more able to verbalize and ambulate with therapy Family will take the patient home with hospice of the [place] Equipment will be delivered including [name] bed etc.-we will get one-time therapy eval for stairs to ensure she is able to do the same  Will CC Dr. Buckley and he can coordinate  with [name] if family continues to be interested in trial therapy for glioblastoma  Diabetes mellitus with neuropathy Would not cover sugars aggressively unless sugars are above 250 consistently on checks at home-I have resumed Jardiance but held metformin and gabapentin  from prior to admission given hospice philosophy Can be discussed with hospice as an outpatient OSA not on CPAP HTN Normotensive off of metoprolol  XL and would hold going forward Graves' disease Continue Synthroid  for now   Discharge Exam: Vitals:   05/10/24 0332 05/10/24 0741  BP: (!) 145/66 (!) 148/70  Pulse: 73 74  Resp: 18 16  Temp: 98.7 F (37.1 C) 98.2 F (36.8 C)  SpO2: 95% 96%    Subj on day of d/c   More awake coherent pleasant looks well feels fair she is a little incoherent to place person but is pleasant and closer for daughter to how she normally has been over the several weeks She is interactive and eating well She worked with therapy and walked to the nursing station yesterday but has not done stairs yet  General Exam on discharge  EOMI NCAT no focal deficit to exam extraocular movements intact Neck soft supple Arm raise as well as leg raise bilaterally equal good strength Reflexes deferred Abdomen soft S1-S2 no murmur Chest is clear  Discharge Instructions   Discharge Instructions     Diet - low sodium heart healthy   Complete by: As directed    Discharge instructions   Complete by: As directed    We will get hospice to discuss with you a one-time therapy eval at home I have discontinued meds that I feel are not really within the scope of hospice-are more in keeping with long term longgevity-please see list carefully Would not worry too much about blood sugars unless they go above 250 or 260 and then on your own you may be able to resume metformin Please ensure that she takes Decadron  daily and Keppra  as well as Vimpat  which we have ordered for her to prevent seizures Equipment should  be delivered later today based on my discussion with hospice   Increase activity slowly   Complete by: As directed       Allergies as of 05/10/2024       Reactions   Tobramycin Hives   Sulfa Antibiotics Other (See Comments)   Stomach upset   Erythromycin Nausea Only        Medication List     STOP taking these medications    gabapentin  300 MG capsule Commonly known as: NEURONTIN    metFORMIN 500 MG tablet Commonly known as: GLUCOPHAGE   metoprolol  succinate 100 MG 24 hr tablet Commonly known as: TOPROL -XL   mirtazapine 7.5 MG tablet Commonly known as: REMERON   rosuvastatin  5 MG tablet Commonly known as: CRESTOR    Vitamin D  50 MCG (2000 UT) tablet       TAKE these medications    acetaminophen  500 MG tablet Commonly known as: TYLENOL  Take 1,000 mg by mouth every 8 (eight) hours as needed for moderate pain.   dexamethasone  2 MG tablet Commonly known as: DECADRON  Take 1 tablet (2 mg total) by mouth daily.   fluticasone  50 MCG/ACT nasal spray Commonly known as: FLONASE  Place 2 sprays into both nostrils at bedtime as needed for allergies.   Jardiance 10 MG Tabs tablet Generic drug: empagliflozin Take 10 mg by mouth in the morning.   Lacosamide  100 MG Tabs Take 1 tablet (  100 mg total) by mouth 2 (two) times daily. What changed:  medication strength how much to take   levETIRAcetam  1000 MG tablet Commonly known as: KEPPRA  Take 1 tablet (1,000 mg total) by mouth 2 (two) times daily.   levothyroxine  88 MCG tablet Commonly known as: SYNTHROID  Take 88 mcg by mouth daily before breakfast.   ondansetron  8 MG tablet Commonly known as: ZOFRAN  Take 1 tablet (8 mg total) by mouth every 8 (eight) hours as needed for nausea or vomiting.   prochlorperazine  10 MG tablet Commonly known as: COMPAZINE  Take 1 tablet (10 mg total) by mouth every 6 (six) hours as needed for nausea or vomiting.       Allergies  Allergen Reactions   Tobramycin Hives   Sulfa  Antibiotics Other (See Comments)    Stomach upset   Erythromycin Nausea Only      The results of significant diagnostics from this hospitalization (including imaging, microbiology, ancillary and laboratory) are listed below for reference.    Significant Diagnostic Studies: Overnight EEG with video Result Date: 05/07/2024 Shelton Arlin KIDD, MD     05/07/2024  7:45 PM Patient Name: ANAIH BRANDER MRN: [id number] Epilepsy Attending: Arlin KIDD Shelton Referring Physician/Provider: Michaela Aisha SQUIBB, MD Duration: 05/06/2024 1209 to 05/07/2024 1209  Patient history: 78 y.o. female hx of anxiety, left glioblastoma who presents for acute onset of right sided facial droop, and slurred speech. Patient appears to be having a focal seizure with right gaze preference with eyes and facial twitching. EEG to evaluate for seizure.  Level of alertness: lethargic, asleep  AEDs during EEG study: LEV, LCM, PHT, ativan   Technical aspects: This EEG study was done with scalp electrodes positioned according to the 10-20 International system of electrode placement. Electrical activity was reviewed with band pass filter of 1-70Hz , sensitivity of 7 uV/mm, display speed of 27mm/sec with a 60Hz  notched filter applied as appropriate. EEG data were recorded continuously and digitally stored.  Video monitoring was available and reviewed as appropriate.  Description: EEG showed continuous generalized and lateralized left hemisphere 3 to 6 Hz theta-delta slowing admixed with 15 to 18 Hz beta activity distributed symmetrically and diffusely. Sleep was characterized by vertex waves, sleep spindles (12 to 14 Hz), maximal frontocentral region. Spikes were noted in left fronto-centro-temporal region. Hyperventilation and photic stimulation were not performed.    ABNORMALITY - Spikes, left fronto-centro-temporal region. - Continuous slow, generalized  and lateralized left hemisphere  IMPRESSION: This study showed evidence of epileptogenicity  arising from   left fronto-centro-temporal region. Additionally there is cortical dysfunction arising from left hemisphere likely secondary to underlying structural abnormality/ tumor. Additionally there is moderate diffuse encephalopathy. No seizures were seen throughout the recording.  Of note, focal motor seizures may not seen on scalp EEG. Clinical correlation is recommended.   Arlin KIDD Shelton   EEG adult Result Date: 05/06/2024 Shelton Arlin KIDD, MD     05/07/2024  9:07 AM Patient Name: ANNETE AYUSO MRN: [id number] Epilepsy Attending: Arlin KIDD Shelton Referring Physician/Provider: Michaela Aisha SQUIBB, MD Date: 05/06/2024 Duration: 23.14 mins Patient history: 78 y.o. female hx of anxiety, left glioblastoma who presents for acute onset of right sided facial droop, and slurred speech. Patient appears to be having a focal seizure with right gaze preference with eyes and facial twitching. EEG to evaluate for seizure. Level of alertness: lethargic, asleep AEDs during EEG study: LEV, LCM, PHT, ativan  Technical aspects: This EEG study was done with scalp electrodes positioned according to the 10-20  International system of electrode placement. Electrical activity was reviewed with band pass filter of 1-70Hz , sensitivity of 7 uV/mm, display speed of 9mm/sec with a 60Hz  notched filter applied as appropriate. EEG data were recorded continuously and digitally stored.  Video monitoring was available and reviewed as appropriate. Description: EEG showed continuous generalized and lateralized left hemisphere 3 to 6 Hz theta-delta slowing admixed with 15 to 18 Hz beta activity distributed symmetrically and diffusely. Sleep was characterized by sleep spindles (12 to 14 Hz), maximal frontocentral region. Hyperventilation and photic stimulation were not performed.   ABNORMALITY - Continuous slow, generalized  and lateralized left hemisphere IMPRESSION: This study is suggestive of cortical dysfunction arising from left  hemisphere likely secondary to underlying structural abnormality/ tumor. Additionally there is moderate diffuse encephalopathy. No seizures or epileptiform discharges were seen throughout the recording. Of note, focal motor seizures may not seen on scalp EEG. Clinical correlation is recommended. Arlin MALVA Krebs   CT HEAD CODE STROKE WO CONTRAST Result Date: 05/06/2024 EXAM: CT HEAD WITHOUT CONTRAST 05/06/2024 09:34:40 AM TECHNIQUE: CT of the head was performed without the administration of intravenous contrast. Automated exposure control, iterative reconstruction, and/or weight based adjustment of the mA/kV was utilized to reduce the radiation dose to as low as reasonably achievable. COMPARISON: CT of the head dated 03/23/2024 and MRI of the brain dated 04/22/2024. CLINICAL HISTORY: Neuro deficit, acute, stroke suspected. RIGHT SIDE FACIAL DROOP; SLURRED SPEECH; LKW 0850; Neuro- Kirkpatrick. FINDINGS: BRAIN AND VENTRICLES: No acute hemorrhage. No evidence of acute infarct. No hydrocephalus. No extra-axial collection. No mass effect or midline shift. Postsurgical encephalomalacia changes are again demonstrated within the left temporal lobe. There is a mixed attenuation mass present within the left parietal lobe which has increased in size since the previous CT from approximately 2.6 x 2.1 cm to approximately 3.9 x 3.4 cm. The mass is now denser along its periphery with a smaller necrotic central core. [place] stroke program early CT (ASPECT) score: Ganglionic (caudate, ic, Lentiform Nucleus, insula, M1-m3): 7. Supraganglionic (m4-m6): 3. Total: 10. ORBITS: No acute abnormality. SINUSES: No acute abnormality. SOFT TISSUES AND SKULL: No acute soft tissue abnormality. No skull fracture. The above findings were communicated to Dr. Michaela at 09:40 AM 05/06/2024 via Amion. IMPRESSION: 1. Mixed attenuation mass in the left parietal lobe, increased in size since the previous CT from approximately 2.6 x 2.1 cm to  approximately 3.9 x 3.4 cm, now denser along its periphery with a smaller necrotic central core. 2. Postsurgical encephalomalacia changes within the left temporal lobe. 3. ASPECT score: 10. Electronically signed by: Evalene Coho MD 05/06/2024 09:46 AM EDT RP Workstation: [id number]    Microbiology: No results found for this or any previous visit (from the past 240 hours).   Labs: Basic Metabolic Panel: Recent Labs  Lab 05/06/24 0923 05/06/24 0930 05/07/24 1028 05/09/24 1101  NA 137 138 140 137  K 4.0 4.0 3.8 3.3*  CL 102 101 104 106  CO2 23  --  20* 19*  GLUCOSE 157* 159* 142* 195*  BUN 15 16 19 17   CREATININE 0.68 0.60 0.76 0.77  CALCIUM  9.2  --  9.1 8.4*  MG  --   --  2.0  --    Liver Function Tests: Recent Labs  Lab 05/06/24 0923 05/07/24 1028  AST 21 19  ALT 15 16  ALKPHOS 56 56  BILITOT 0.7 1.1  PROT 6.7 6.7  ALBUMIN 3.7 3.6   No results for input(s): LIPASE, AMYLASE in the last 168  hours. No results for input(s): AMMONIA in the last 168 hours. CBC: Recent Labs  Lab 05/06/24 0923 05/06/24 0930 05/07/24 1028  WBC 8.0  --  10.4  NEUTROABS 5.8  --  8.4*  HGB 14.1 13.9 13.7  HCT 43.1 41.0 42.1  MCV 80.0  --  79.7*  PLT 218  --  248   Cardiac Enzymes: No results for input(s): CKTOTAL, CKMB, CKMBINDEX, TROPONINI in the last 168 hours. BNP: BNP (last 3 results) No results for input(s): BNP in the last 8760 hours.  ProBNP (last 3 results) No results for input(s): PROBNP in the last 8760 hours.  CBG: Recent Labs  Lab 05/06/24 0922 05/09/24 1202 05/09/24 1610 05/09/24 2152 05/10/24 0624  GLUCAP 142* 205* 163* 207* 140*    Signed:  Colen Grimes MD   [facility] 05/10/2024, 8:55 AM

## 2024-05-10 NOTE — Progress Notes (Signed)
 Physical Therapy Treatment Patient Details Name: Yvonne [name] MRN: [id number] DOB: [birth year] Today's Date: 05/10/2024   History of Present Illness Pt 78 y.o. female presents to [facility] 05/06/24 with R sided facial droop, worsening aphasia, and R gaze. CT head showed mixed attenuation mass L parietal lobe, hx of GMB. EEG showed epileptogenicity from L fronto-centro-temporal region and cortical dysfunction in L hemisphere likely 2/2 tumor as well as moderate diffuse encephalopathy. 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    PT Comments  Patient plans to go to daughter's home with 4 steps to enter with rt and left rails. Daughter present for education in all mobility with use of gait belt (issued), including stairs. Pt with one instance of imbalance when descending step forwards and caught her left heel on the step--required min assist to recover. Stepping down sideways she required CGA, however ended with narrow base of support. Educated daughter on up/down stairs with pt in wheelchair with 2 person assist. Pt/daughter with no further questions.     If plan is discharge home, recommend the following: Direct supervision/assist for medications management;Direct supervision/assist for financial management;Assist for transportation;Help with stairs or ramp for entrance;Assistance with cooking/housework;A little help with walking and/or transfers;A little help with bathing/dressing/bathroom;Supervision due to cognitive status   Can travel by private vehicle        Equipment Recommendations  BSC/3in1;Wheelchair (measurements PT);Wheelchair cushion  (measurements PT);Hospital bed;Hoyer lift;Rolling walker (2 wheels)    Recommendations for Other Services       Precautions / Restrictions Precautions Precautions: Fall Recall of Precautions/Restrictions: Impaired Restrictions Weight Bearing Restrictions Per Provider Order: No     Mobility  Bed Mobility Overal bed mobility: Needs Assistance Bed Mobility: Supine to Sit     Supine to sit: Contact guard, HOB elevated          Transfers Overall transfer level: Needs assistance Equipment used: Rolling walker (2 wheels) Transfers: Sit to/from Stand Sit to Stand: Contact guard assist           General transfer comment: vc for hand placement; repeated x 2    Ambulation/Gait Ambulation/Gait assistance: Min assist Gait Distance (Feet): 100 Feet Assistive device: Rolling walker (2 wheels) Gait Pattern/deviations: Step-through pattern, Decreased stride length, Trunk flexed, Drifts right/left   Gait velocity interpretation: 1.31 - 2.62 ft/sec, indicative of limited community ambulator   General Gait Details: drifting to her right and required assist not to run into objects on her right   Stairs Stairs: Yes Stairs assistance: Min assist Stair Management: One rail Right, One rail Left, Step to pattern, Sideways, Forwards Number of Stairs: 1 (x 2 reps) General stair comments: use of foot board as rail (with bed elevated); up facing forwards x 2 leading with RLE; descended x1 sideways with both hands on the rail, but very narrow base of support; down forward x1 with pt catching left heel as advancing LLE and required min assist for Psychiatrist Wheelchair mobility:  (educated daughter in up/down steps with wheelchair as another option (especially for descending); handout provided)   Tilt Bed    Modified Rankin (Stroke Patients Only)       Balance Overall balance assessment: Needs assistance Sitting-balance support:  No upper  extremity supported, Feet supported Sitting balance-Leahy Scale: Fair     Standing balance support: Bilateral upper extremity supported, During functional activity Standing balance-Leahy Scale: Poor Standing balance comment: reliant on RW or rail for support                            Communication Communication Communication: Impaired Factors Affecting Communication: Reduced clarity of speech;Difficulty expressing self  Cognition Arousal: Alert Behavior During Therapy: Impulsive   PT - Cognitive impairments: Difficult to assess Difficult to assess due to: Impaired communication                     PT - Cognition Comments: A&Ox1, occasionally able to respond but primarily nonverbal. Intermittent following of commands Following commands: Impaired Following commands impaired: Follows one step commands inconsistently, Follows one step commands with increased time    Cueing Cueing Techniques: Verbal cues, Tactile cues  Exercises      General Comments        Pertinent Vitals/Pain Pain Assessment Pain Assessment: Faces Faces Pain Scale: No hurt    Home Living Family/patient expects to be discharged to:: Private residence Living Arrangements: Children (going to daughter's home) Available Help at Discharge: Family;Friend(s);Available 24 hours/day Type of Home: House Home Access: Stairs to enter Entrance Stairs-Rails: Doctor, general practice of Steps: 4   Home Layout: One level Home Equipment: None Additional Comments: information updated per daughter    Prior Function            PT Goals (current goals can now be found in the care plan section) Acute Rehab PT Goals Patient Stated Goal: unable to state goal Time For Goal Achievement: 05/22/24 Potential to Achieve Goals: Fair Progress towards PT goals: Goals met/education completed, patient discharged from PT    Frequency    Min 2X/week      PT Plan      Co-evaluation               AM-PAC PT 6 Clicks Mobility   Outcome Measure  Help needed turning from your back to your side while in a flat bed without using bedrails?: None Help needed moving from lying on your back to sitting on the side of a flat bed without using bedrails?: A Little Help needed moving to and from a bed to a chair (including a wheelchair)?: A Little Help needed standing up from a chair using your arms (e.g., wheelchair or bedside chair)?: A Little Help needed to walk in hospital room?: A Little Help needed climbing 3-5 steps with a railing? : A Little 6 Click Score: 19    End of Session Equipment Utilized During Treatment: Gait belt (issued pt a belt) Activity Tolerance: Patient tolerated treatment well Patient left: with call bell/phone within reach;with family/visitor present;in chair Nurse Communication: Mobility status;Other (comment) (ok to discharge) PT Visit Diagnosis: Unsteadiness on feet (R26.81);Other abnormalities of gait and mobility (R26.89);Muscle weakness (generalized) (M62.81);History of falling (Z91.81)     Time: 8995-8978 PT Time Calculation (min) (ACUTE ONLY): 17 min  Charges:    $Gait Training: 8-22 mins PT General Charges $$ ACUTE PT VISIT: 1 Visit                      Macario RAMAN, PT Acute Rehabilitation Services  Office [phone]    Macario SHAUNNA Soja 05/10/2024, 10:38 AM

## 2024-05-10 NOTE — Progress Notes (Signed)
   Palliative Medicine Inpatient Follow Up Note HPI: Yvonne [name] is a 78 y.o. female with past medical hx of glioglastoma, DM, graves disease, HTN, HLD. Yvonne [name] was brought from Yvonne [name] where she was eating breakfast with family and now complaining of what son in law reported as worsening aphasia and facial droop as well as R gaze.  Palliative care asked to support additional goals of care conversations.   Today's Discussion 05/10/2024  *Please note that this is a verbal dictation therefore any spelling or grammatical errors are due to the Dragon Medical One system interpretation.  Notes of Dr. Buckley, Dr. Royal,  [facility], and Hospice liaison reviewed. Checked recent laboratory results, and diagnostic images.  I met with Yvonne [name] and her daughter Yvonne [name] this morning. Yvonne [name] is sitting up in bed awake and alert. She shares that she is feeling much improved. I was able to introduce myself and my role on the PMT to Yvonne [name] specifically.  We discussed that at this juncture the plan will be for Yvonne [name] to transition home with hospice support through [facility].  Multidisciplinary team has been communicated with via secure chat. Plan for transition to home today if all DME are delivered.   Questions and concerns addressed/Palliative Support Provided.   Objective Assessment: Vital Signs Vitals:   05/10/24 0332 05/10/24 0741  BP: (!) 145/66 (!) 148/70  Pulse: 73 74  Resp: 18 16  Temp: 98.7 F (37.1 C) 98.2 F (36.8 C)  SpO2: 95% 96%    Intake/Output Summary (Last 24 hours) at 05/10/2024 0940 Last data filed at 05/09/2024 2000 Gross per 24 hour  Intake 738.54 ml  Output 985 ml  Net -246.46 ml   Last Weight  Most recent update: 05/06/2024  9:31 AM    Weight  68 kg (149 lb 14.6 oz)            Gen:  Elderly Caucasian F chronically ill appearing HEENT: moist mucous membranes CV: Regular rate and rhythm  PULM:  Breathing is nonlabored ABD: soft/nontender  EXT: No  edema  Neuro:  Awake - disoriented  SUMMARY OF RECOMMENDATIONS   DNAR   Yvonne [name] is greatly improved  Plan for transition home with [facility] [place] ______________________________________________________________________________________ Yvonne [name] Palliative Medicine Team Team Cell Phone: [phone] Please utilize secure chat with additional questions, if there is no response within 30 minutes please call the above phone number  Billing based on MDM: Moderate   Palliative Medicine Team providers are available by phone from 7am to 7pm daily and can be reached through the team cell phone.  Should this patient require assistance outside of these hours, please call the patient's attending physician.

## 2024-05-12 ENCOUNTER — Encounter: Payer: Self-pay | Admitting: Internal Medicine

## 2024-05-12 ENCOUNTER — Other Ambulatory Visit (HOSPITAL_BASED_OUTPATIENT_CLINIC_OR_DEPARTMENT_OTHER): Payer: Self-pay

## 2024-05-12 MED ORDER — MORPHINE SULFATE (CONCENTRATE) 10 MG /0.5 ML PO SOLN
ORAL | 0 refills | Status: DC
  Filled 2024-05-12: qty 30, 20d supply, fill #0

## 2024-05-12 MED ORDER — HYDROCORTISONE 1 % EX CREA
TOPICAL_CREAM | CUTANEOUS | 2 refills | Status: DC
  Filled 2024-05-12: qty 28.35, 30d supply, fill #0

## 2024-05-12 MED ORDER — MILK OF MAGNESIA 1200 MG/15ML PO SUSP
30.0000 mL | Freq: Every day | ORAL | 1 refills | Status: DC | PRN
  Filled 2024-05-12: qty 473, 15d supply, fill #0

## 2024-05-14 ENCOUNTER — Telehealth: Payer: Self-pay | Admitting: *Deleted

## 2024-05-14 NOTE — Telephone Encounter (Signed)
 Received PC from patient's daughter, Reena - she is requesting phone visit to discuss events since recent hospitalization with Dr Buckley.  Patient is home with hospice.  Phone appointment scheduled on 05/18/44 at 11:45.

## 2024-05-18 ENCOUNTER — Ambulatory Visit: Admitting: Internal Medicine

## 2024-05-18 ENCOUNTER — Other Ambulatory Visit (HOSPITAL_BASED_OUTPATIENT_CLINIC_OR_DEPARTMENT_OTHER): Payer: Self-pay

## 2024-05-18 ENCOUNTER — Other Ambulatory Visit: Payer: Self-pay

## 2024-05-18 ENCOUNTER — Inpatient Hospital Stay: Admitting: Internal Medicine

## 2024-05-18 ENCOUNTER — Other Ambulatory Visit (HOSPITAL_COMMUNITY): Payer: Self-pay

## 2024-05-18 MED ORDER — JARDIANCE 10 MG PO TABS
10.0000 mg | ORAL_TABLET | Freq: Every day | ORAL | 0 refills | Status: DC
  Filled 2024-05-18 (×2): qty 30, 30d supply, fill #0

## 2024-05-19 ENCOUNTER — Other Ambulatory Visit (HOSPITAL_BASED_OUTPATIENT_CLINIC_OR_DEPARTMENT_OTHER): Payer: Self-pay

## 2024-05-19 ENCOUNTER — Encounter: Payer: Self-pay | Admitting: Internal Medicine

## 2024-05-19 MED ORDER — SENNA-DOCUSATE SODIUM 8.6-50 MG PO TABS
ORAL_TABLET | Freq: Two times a day (BID) | ORAL | 0 refills | Status: DC | PRN
  Filled 2024-05-19: qty 30, 7d supply, fill #0

## 2024-05-20 ENCOUNTER — Other Ambulatory Visit: Payer: Self-pay | Admitting: *Deleted

## 2024-05-20 ENCOUNTER — Telehealth: Payer: Self-pay | Admitting: *Deleted

## 2024-05-20 ENCOUNTER — Inpatient Hospital Stay
Admission: RE | Admit: 2024-05-20 | Discharge: 2024-05-20 | Disposition: A | Discharge status: Home / Self Care | Payer: Self-pay | Source: Ambulatory Visit | Attending: Internal Medicine | Admitting: Internal Medicine

## 2024-05-20 DIAGNOSIS — C712 Malignant neoplasm of temporal lobe: Secondary | ICD-10-CM | POA: Diagnosis not present

## 2024-05-20 NOTE — Telephone Encounter (Signed)
 Orders placed for outside MRI, email message to [facility] to push images to [facility]. Fax to [facility] radiology with patient demographics requesting that they push today's MRI to [facility].

## 2024-05-22 ENCOUNTER — Other Ambulatory Visit (HOSPITAL_BASED_OUTPATIENT_CLINIC_OR_DEPARTMENT_OTHER): Payer: Self-pay

## 2024-05-22 MED ORDER — NITROFURANTOIN MONOHYD MACRO 100 MG PO CAPS
100.0000 mg | ORAL_CAPSULE | Freq: Two times a day (BID) | ORAL | 0 refills | Status: DC
  Filled 2024-05-22: qty 14, 7d supply, fill #0

## 2024-05-25 ENCOUNTER — Encounter: Payer: Self-pay | Admitting: *Deleted

## 2024-05-25 NOTE — Progress Notes (Signed)
 Requested that [facility] MRI 05/20/2024 to [facility].

## 2024-05-29 ENCOUNTER — Inpatient Hospital Stay (HOSPITAL_COMMUNITY)
Admission: EM | Admit: 2024-05-29 | Discharge: 2024-06-01 | DRG: 055 | Disposition: A | Discharge status: Hospice | Attending: Family Medicine | Admitting: Family Medicine

## 2024-05-29 ENCOUNTER — Emergency Department (HOSPITAL_COMMUNITY)

## 2024-05-29 ENCOUNTER — Other Ambulatory Visit: Payer: Self-pay

## 2024-05-29 ENCOUNTER — Encounter (HOSPITAL_COMMUNITY): Payer: Self-pay | Admitting: Internal Medicine

## 2024-05-29 DIAGNOSIS — Z823 Family history of stroke: Secondary | ICD-10-CM

## 2024-05-29 DIAGNOSIS — G40909 Epilepsy, unspecified, not intractable, without status epilepticus: Secondary | ICD-10-CM | POA: Diagnosis not present

## 2024-05-29 DIAGNOSIS — F419 Anxiety disorder, unspecified: Secondary | ICD-10-CM | POA: Diagnosis present

## 2024-05-29 DIAGNOSIS — C712 Malignant neoplasm of temporal lobe: Principal | ICD-10-CM | POA: Diagnosis present

## 2024-05-29 DIAGNOSIS — G40901 Epilepsy, unspecified, not intractable, with status epilepticus: Secondary | ICD-10-CM | POA: Diagnosis not present

## 2024-05-29 DIAGNOSIS — Z8249 Family history of ischemic heart disease and other diseases of the circulatory system: Secondary | ICD-10-CM

## 2024-05-29 DIAGNOSIS — Z23 Encounter for immunization: Secondary | ICD-10-CM

## 2024-05-29 DIAGNOSIS — E1142 Type 2 diabetes mellitus with diabetic polyneuropathy: Secondary | ICD-10-CM | POA: Diagnosis present

## 2024-05-29 DIAGNOSIS — Z85820 Personal history of malignant melanoma of skin: Secondary | ICD-10-CM

## 2024-05-29 DIAGNOSIS — E785 Hyperlipidemia, unspecified: Secondary | ICD-10-CM | POA: Diagnosis present

## 2024-05-29 DIAGNOSIS — G4733 Obstructive sleep apnea (adult) (pediatric): Secondary | ICD-10-CM | POA: Diagnosis present

## 2024-05-29 DIAGNOSIS — C719 Malignant neoplasm of brain, unspecified: Principal | ICD-10-CM

## 2024-05-29 DIAGNOSIS — Z87891 Personal history of nicotine dependence: Secondary | ICD-10-CM

## 2024-05-29 DIAGNOSIS — Z881 Allergy status to other antibiotic agents status: Secondary | ICD-10-CM

## 2024-05-29 DIAGNOSIS — Z90710 Acquired absence of both cervix and uterus: Secondary | ICD-10-CM

## 2024-05-29 DIAGNOSIS — R059 Cough, unspecified: Secondary | ICD-10-CM | POA: Diagnosis not present

## 2024-05-29 DIAGNOSIS — Z8049 Family history of malignant neoplasm of other genital organs: Secondary | ICD-10-CM

## 2024-05-29 DIAGNOSIS — N390 Urinary tract infection, site not specified: Secondary | ICD-10-CM | POA: Diagnosis present

## 2024-05-29 DIAGNOSIS — R569 Unspecified convulsions: Secondary | ICD-10-CM

## 2024-05-29 DIAGNOSIS — G40101 Localization-related (focal) (partial) symptomatic epilepsy and epileptic syndromes with simple partial seizures, not intractable, with status epilepticus: Secondary | ICD-10-CM | POA: Diagnosis present

## 2024-05-29 DIAGNOSIS — Z515 Encounter for palliative care: Secondary | ICD-10-CM

## 2024-05-29 DIAGNOSIS — Z79899 Other long term (current) drug therapy: Secondary | ICD-10-CM

## 2024-05-29 DIAGNOSIS — I119 Hypertensive heart disease without heart failure: Secondary | ICD-10-CM | POA: Diagnosis present

## 2024-05-29 DIAGNOSIS — E039 Hypothyroidism, unspecified: Secondary | ICD-10-CM | POA: Diagnosis present

## 2024-05-29 DIAGNOSIS — Z882 Allergy status to sulfonamides status: Secondary | ICD-10-CM

## 2024-05-29 DIAGNOSIS — H9193 Unspecified hearing loss, bilateral: Secondary | ICD-10-CM | POA: Diagnosis present

## 2024-05-29 DIAGNOSIS — Z1152 Encounter for screening for COVID-19: Secondary | ICD-10-CM

## 2024-05-29 DIAGNOSIS — Z8673 Personal history of transient ischemic attack (TIA), and cerebral infarction without residual deficits: Secondary | ICD-10-CM

## 2024-05-29 DIAGNOSIS — Z7984 Long term (current) use of oral hypoglycemic drugs: Secondary | ICD-10-CM

## 2024-05-29 DIAGNOSIS — Z808 Family history of malignant neoplasm of other organs or systems: Secondary | ICD-10-CM

## 2024-05-29 DIAGNOSIS — Z79890 Hormone replacement therapy (postmenopausal): Secondary | ICD-10-CM

## 2024-05-29 DIAGNOSIS — E1140 Type 2 diabetes mellitus with diabetic neuropathy, unspecified: Secondary | ICD-10-CM | POA: Diagnosis present

## 2024-05-29 DIAGNOSIS — C713 Malignant neoplasm of parietal lobe: Secondary | ICD-10-CM | POA: Diagnosis not present

## 2024-05-29 DIAGNOSIS — Z833 Family history of diabetes mellitus: Secondary | ICD-10-CM

## 2024-05-29 DIAGNOSIS — R079 Chest pain, unspecified: Secondary | ICD-10-CM | POA: Diagnosis not present

## 2024-05-29 DIAGNOSIS — G9389 Other specified disorders of brain: Secondary | ICD-10-CM | POA: Diagnosis not present

## 2024-05-29 DIAGNOSIS — Z66 Do not resuscitate: Secondary | ICD-10-CM | POA: Diagnosis present

## 2024-05-29 LAB — COMPREHENSIVE METABOLIC PANEL WITH GFR
ALT: 21 U/L (ref 0–44)
AST: 22 U/L (ref 15–41)
Albumin: 3.1 g/dL — ABNORMAL LOW (ref 3.5–5.0)
Alkaline Phosphatase: 49 U/L (ref 38–126)
Anion gap: 12 (ref 5–15)
BUN: 13 mg/dL (ref 8–23)
CO2: 25 mmol/L (ref 22–32)
Calcium: 8.7 mg/dL — ABNORMAL LOW (ref 8.9–10.3)
Chloride: 99 mmol/L (ref 98–111)
Creatinine, Ser: 0.69 mg/dL (ref 0.44–1.00)
GFR, Estimated: >60 mL/min (ref >60)
Glucose, Bld: 151 mg/dL — ABNORMAL HIGH (ref 70–99)
Potassium: 3.2 mmol/L — ABNORMAL LOW (ref 3.5–5.1)
Sodium: 136 mmol/L (ref 135–145)
Total Bilirubin: 0.3 mg/dL (ref 0.0–1.2)
Total Protein: 5.5 g/dL — ABNORMAL LOW (ref 6.5–8.1)

## 2024-05-29 LAB — URINALYSIS, W/ REFLEX TO CULTURE (INFECTION SUSPECTED)
Bilirubin Urine: NEGATIVE
Glucose, UA: >=500 mg/dL — AB
Ketones, ur: NEGATIVE mg/dL
Nitrite: NEGATIVE
Protein, ur: NEGATIVE mg/dL
RBC / HPF: >50 RBC/hpf (ref 0–5)
Specific Gravity, Urine: 1.025 (ref 1.005–1.030)
WBC, UA: >50 WBC/hpf (ref 0–5)
pH: 5 (ref 5.0–8.0)

## 2024-05-29 LAB — CBC WITH DIFFERENTIAL/PLATELET
Abs Immature Granulocytes: 0.05 K/uL (ref 0.00–0.07)
Basophils Absolute: 0 K/uL (ref 0.0–0.1)
Basophils Relative: 1 %
Eosinophils Absolute: 0.3 K/uL (ref 0.0–0.5)
Eosinophils Relative: 4 %
HCT: 38.2 % (ref 36.0–46.0)
Hemoglobin: 12.5 g/dL (ref 12.0–15.0)
Immature Granulocytes: 1 %
Lymphocytes Relative: 9 %
Lymphs Abs: 0.7 K/uL (ref 0.7–4.0)
MCH: 25.3 pg — ABNORMAL LOW (ref 26.0–34.0)
MCHC: 32.7 g/dL (ref 30.0–36.0)
MCV: 77.3 fL — ABNORMAL LOW (ref 80.0–100.0)
Monocytes Absolute: 0.7 K/uL (ref 0.1–1.0)
Monocytes Relative: 9 %
Neutro Abs: 6.2 K/uL (ref 1.7–7.7)
Neutrophils Relative %: 76 %
Platelets: 158 K/uL (ref 150–400)
RBC: 4.94 MIL/uL (ref 3.87–5.11)
RDW: 14.5 % (ref 11.5–15.5)
WBC: 8 K/uL (ref 4.0–10.5)
nRBC: 0 % (ref 0.0–0.2)

## 2024-05-29 LAB — GLUCOSE, CAPILLARY
Glucose-Capillary: 107 mg/dL — ABNORMAL HIGH (ref 70–99)
Glucose-Capillary: 131 mg/dL — ABNORMAL HIGH (ref 70–99)
Glucose-Capillary: 171 mg/dL — ABNORMAL HIGH (ref 70–99)

## 2024-05-29 LAB — CBG MONITORING, ED
Glucose-Capillary: 192 mg/dL — ABNORMAL HIGH (ref 70–99)
Glucose-Capillary: 176 mg/dL — ABNORMAL HIGH (ref 70–99)

## 2024-05-29 LAB — RESP PANEL BY RT-PCR (RSV, FLU A&B, COVID)  RVPGX2
Influenza A by PCR: NEGATIVE
Influenza B by PCR: NEGATIVE
Resp Syncytial Virus by PCR: NEGATIVE
SARS Coronavirus 2 by RT PCR: NEGATIVE

## 2024-05-29 LAB — PHENYTOIN LEVEL, TOTAL: Phenytoin Lvl: 16.8 ug/mL (ref 10.0–20.0)

## 2024-05-29 LAB — HEMOGLOBIN A1C
Hgb A1c MFr Bld: 7.7 % — ABNORMAL HIGH (ref 4.8–5.6)
Mean Plasma Glucose: 174.29 mg/dL

## 2024-05-29 LAB — MAGNESIUM: Magnesium: 1.7 mg/dL (ref 1.7–2.4)

## 2024-05-29 MED ORDER — DEXAMETHASONE SODIUM PHOSPHATE 4 MG/ML IJ SOLN
4.0000 mg | Freq: Two times a day (BID) | INTRAMUSCULAR | Status: DC
  Administered 2024-05-29 – 2024-06-01 (×6): 4 mg via INTRAVENOUS
  Filled 2024-05-29 (×6): qty 1

## 2024-05-29 MED ORDER — DEXAMETHASONE SOD PHOSPHATE PF 10 MG/ML IJ SOLN: 10.0000 mg | Freq: Two times a day (BID) | INTRAMUSCULAR | Status: DC

## 2024-05-29 MED ORDER — INSULIN ASPART 100 UNIT/ML IJ SOLN
0.0000 [IU] | INTRAMUSCULAR | Status: DC
  Administered 2024-05-29: 2 [IU] via SUBCUTANEOUS
  Administered 2024-05-29: 1 [IU] via SUBCUTANEOUS
  Administered 2024-05-29: 3 [IU] via SUBCUTANEOUS
  Administered 2024-05-30: 1 [IU] via SUBCUTANEOUS
  Administered 2024-05-31: 5 [IU] via SUBCUTANEOUS
  Administered 2024-05-31: 2 [IU] via SUBCUTANEOUS
  Administered 2024-05-31: 3 [IU] via SUBCUTANEOUS
  Administered 2024-06-01: 1 [IU] via SUBCUTANEOUS
  Administered 2024-06-01: 3 [IU] via SUBCUTANEOUS
  Administered 2024-06-01: 1 [IU] via SUBCUTANEOUS

## 2024-05-29 MED ORDER — LEVETIRACETAM (KEPPRA) 500 MG/5 ML ADULT IV PUSH
1500.0000 mg | Freq: Once | INTRAVENOUS | Status: DC
  Filled 2024-05-29: qty 15

## 2024-05-29 MED ORDER — ACETAMINOPHEN 325 MG PO TABS: 650.0000 mg | ORAL_TABLET | ORAL | Status: DC | PRN

## 2024-05-29 MED ORDER — SODIUM CHLORIDE 0.9 % IV SOLN
200.0000 mg | Freq: Two times a day (BID) | INTRAVENOUS | Status: DC
  Administered 2024-05-29 – 2024-05-31 (×5): 200 mg via INTRAVENOUS
  Filled 2024-05-29 (×7): qty 20

## 2024-05-29 MED ORDER — DEXAMETHASONE SOD PHOSPHATE PF 10 MG/ML IJ SOLN
10.0000 mg | Freq: Once | INTRAMUSCULAR | Status: AC
  Administered 2024-05-29: 10 mg via INTRAVENOUS

## 2024-05-29 MED ORDER — ONDANSETRON HCL 4 MG PO TABS: 4.0000 mg | ORAL_TABLET | Freq: Four times a day (QID) | ORAL | Status: DC | PRN

## 2024-05-29 MED ORDER — LEVETIRACETAM (KEPPRA) 500 MG/5 ML ADULT IV PUSH: 1000.0000 mg | Freq: Two times a day (BID) | INTRAVENOUS | Status: DC

## 2024-05-29 MED ORDER — POTASSIUM CHLORIDE 10 MEQ/100ML IV SOLN
10.0000 meq | Freq: Once | INTRAVENOUS | Status: AC
  Administered 2024-05-29: 10 meq via INTRAVENOUS
  Filled 2024-05-29: qty 100

## 2024-05-29 MED ORDER — POLYETHYLENE GLYCOL 3350 17 G PO PACK: 17.0000 g | PACK | Freq: Every day | ORAL | Status: DC | PRN

## 2024-05-29 MED ORDER — SODIUM CHLORIDE 0.9 % IV SOLN
1.0000 g | Freq: Once | INTRAVENOUS | Status: AC
  Administered 2024-05-29: 1 g via INTRAVENOUS
  Filled 2024-05-29: qty 10

## 2024-05-29 MED ORDER — SODIUM CHLORIDE 0.9 % IV SOLN
150.0000 mg | Freq: Two times a day (BID) | INTRAVENOUS | Status: DC
  Administered 2024-05-29: 150 mg via INTRAVENOUS
  Filled 2024-05-29 (×4): qty 3

## 2024-05-29 MED ORDER — LEVETIRACETAM (KEPPRA) 500 MG/5 ML ADULT IV PUSH
4000.0000 mg | Freq: Once | INTRAVENOUS | Status: AC
  Administered 2024-05-29: 4000 mg via INTRAVENOUS

## 2024-05-29 MED ORDER — LEVETIRACETAM (KEPPRA) 500 MG/5 ML ADULT IV PUSH
1500.0000 mg | Freq: Two times a day (BID) | INTRAVENOUS | Status: DC
  Administered 2024-05-29 – 2024-05-31 (×5): 1500 mg via INTRAVENOUS
  Filled 2024-05-29 (×5): qty 15

## 2024-05-29 MED ORDER — CHLORHEXIDINE GLUCONATE CLOTH 2 % EX PADS
6.0000 | MEDICATED_PAD | Freq: Every day | CUTANEOUS | Status: DC
  Administered 2024-05-30 – 2024-06-01 (×3): 6 via TOPICAL

## 2024-05-29 MED ORDER — LORAZEPAM 2 MG/ML IJ SOLN: 4.0000 mg | INTRAMUSCULAR | Status: DC | PRN

## 2024-05-29 MED ORDER — SODIUM CHLORIDE 0.9 % IV SOLN
20.0000 mg/kg | Freq: Once | INTRAVENOUS | Status: AC
  Administered 2024-05-29: 1360 mg via INTRAVENOUS
  Filled 2024-05-29: qty 27.2

## 2024-05-29 MED ORDER — SODIUM CHLORIDE 0.9 % IV SOLN: INTRAVENOUS | Status: DC

## 2024-05-29 MED ORDER — ACETAMINOPHEN 650 MG RE SUPP: 650.0000 mg | RECTAL | Status: DC | PRN

## 2024-05-29 MED ORDER — SODIUM CHLORIDE 0.9 % IV SOLN
100.0000 mg | Freq: Two times a day (BID) | INTRAVENOUS | Status: DC
  Filled 2024-05-29 (×3): qty 10

## 2024-05-29 MED ORDER — LORAZEPAM 2 MG/ML IJ SOLN
1.0000 mg | Freq: Once | INTRAMUSCULAR | Status: AC
Start: 2024-05-29 — End: 2024-05-29
  Administered 2024-05-29: 1 mg via INTRAVENOUS
  Filled 2024-05-29: qty 1

## 2024-05-29 MED ORDER — ORAL CARE MOUTH RINSE: 15.0000 mL | OROMUCOSAL | Status: DC | PRN

## 2024-05-29 MED ORDER — ONDANSETRON HCL 4 MG/2ML IJ SOLN: 4.0000 mg | Freq: Four times a day (QID) | INTRAMUSCULAR | Status: DC | PRN

## 2024-05-29 NOTE — Consult Note (Signed)
 NEUROLOGY CONSULT NOTE   Date of service: May 29, 2024 Patient Name: Yvonne [name] MRN:  [id number] DOB:  11-10-1945 Chief Complaint: Breakthrough seizure Requesting Provider: Tobie Mario GAILS, MD  History of Present Illness  Yvonne [name] is a 78 y.o. female with hx of GBM (L parietal lobe) c/b progression & seizures, currently in hospice, who presents with breakthrough seizure.   Presented in status to ED and was aborted with fosphenytoin  20mg /kg load. Currently on Keppra  1g BID, Vimpat  100mg  BID. Of note, was hospitalized three weeks ago for status. At that time, had only been on Keppra . Status also aborted with fosphenytoin , however discharged on Vimpat . Since then, imaging has shown progression with increase in size of lesion. Only on Decadron  2mg  daily due to minimal edema noted in September imaging.  Semiology: R face and arm jerking      ROS  Unable to ascertain due to mental status  Past History   Past Medical History:  Diagnosis Date   Anxiety    Chest pressure    Degenerative arthritis    Deviated septum    Diabetes mellitus without complication (HCC)    diet controlled   Diabetic neuropathy (HCC) 12/03/2017   DJD (degenerative joint disease) of knee    Family history of bladder cancer    Family history of brain cancer    Family history of breast cancer    Family history of ovarian cancer    Family history of prostate cancer    Family history of stomach cancer    Family history of throat cancer    Family history of uterine cancer    Graves disease    RADIOACTIVE IODINE 1983   H/O hiatal hernia    Heart palpitations    History of diastolic dysfunction    per echo in 2011   Hyperlipidemia    Hypertension    Hypothyroidism    LVH (left ventricular hypertrophy)    per echo in 2011   Mitral valve prolapse    Neuropathy, peripheral    DR. JENEL 12/15   Normal nuclear stress test 2011   OSA (obstructive sleep apnea)    Polyneuropathy in other  diseases classified elsewhere 05/21/2013   Rosacea 2019   MELANOMA CHEST REMOVED IN SITU   Sleep difficulties    Tinnitus     Past Surgical History:  Procedure Laterality Date   ABDOMINAL HYSTERECTOMY  2000   TOTAL   APPENDECTOMY  1989   Brain biospy     CARDIOVASCULAR STRESS TEST  11/09/2009   EF 75%   CARPAL TUNNEL RELEASE  2002   bilateral   COLONOSCOPY WITH PROPOFOL  N/A 10/23/2018   Procedure: COLONOSCOPY WITH PROPOFOL ;  Surgeon: Teressa Toribio SQUIBB, MD;  Location: [facility];  Service: Endoscopy;  Laterality: N/A;   crainectomy     cranectomy     ESOPHAGOGASTRODUODENOSCOPY N/A 11/16/2021   Procedure: ESOPHAGOGASTRODUODENOSCOPY (EGD);  Surgeon: Teressa Toribio SQUIBB, MD;  Location: [facility];  Service: Endoscopy;  Laterality: N/A;   ESOPHAGOGASTRODUODENOSCOPY (EGD) WITH PROPOFOL  N/A 10/23/2018   Procedure: ESOPHAGOGASTRODUODENOSCOPY (EGD) WITH PROPOFOL ;  Surgeon: Teressa Toribio SQUIBB, MD;  Location: [facility];  Service: Endoscopy;  Laterality: N/A;   EUS N/A 10/23/2018   Procedure: UPPER ENDOSCOPIC ULTRASOUND (EUS) RADIAL;  Surgeon: Teressa Toribio SQUIBB, MD;  Location: [facility];  Service: Endoscopy;  Laterality: N/A;   EUS N/A 11/16/2021   Procedure: UPPER ENDOSCOPIC ULTRASOUND (EUS) RADIAL;  Surgeon: Teressa Toribio SQUIBB, MD;  Location: [facility];  Service:  Endoscopy;  Laterality: N/A;   MASS EXCISION Left 06/02/2019   Procedure: EXCISION OF SUBCUTANEOUS ABDOMINAL WALL MASSES LEFT LOWER QUADRANT;  Surgeon: Belinda Cough, MD;  Location: [facility] OR;  Service: General;  Laterality: Left;   TONSILLECTOMY  1958   TRANSTHORACIC ECHOCARDIOGRAM  11/01/2009   NORMAL LV FILLING AND DIASTOLIC DYSFUNCTION AND MILD AORTIC SCLEROSIS AND TRACE MITRAL REGURGITATION    Family History: Family History  Problem Relation Age of Onset   Hypertension Mother    Heart disease Mother    Dementia Mother    Heart disease Father    Arthritis Father    Diabetes Father    Hypertension Father    Pancreatic  cancer Brother 6       pancreatic   Other Brother        automobile accident   Throat cancer Maternal Uncle    Brain cancer Paternal Aunt 61       malignant brain cancer   Stroke Maternal Grandmother    Heart disease Maternal Grandfather    Pancreatic cancer Cousin 59       pancreatic (maternal first cousin)   Pancreatic cancer Cousin 11       pancreatic, smoker (maternal first cousin)   Breast cancer Cousin 32       breast (maternal first cousin)   Ovarian cancer Cousin    Uterine cancer Cousin 82       uterine (paternal first cousin)   Cancer Cousin 108       unknown primary (paternal first cousin)   Bladder Cancer Cousin 63       (paternal first cousin)   Lung cancer Cousin    Throat cancer Cousin    Prostate cancer Cousin        (maternal first cousin)   Stomach cancer Cousin 54       (maternal first cousin)   Cancer Other 28       pancreatic neuroendocrine carcinoma   Colon cancer Neg Hx    Neuropathy Neg Hx     Social History  reports that she quit smoking about 48 years ago. Her smoking use included cigarettes. She started smoking about 58 years ago. She has a 5 pack-year smoking history. She has never used smokeless tobacco. She reports that she does not currently use alcohol. She reports that she does not use drugs.  Allergies  Allergen Reactions   Tobramycin Hives   Sulfa Antibiotics Other (See Comments)    Stomach upset   Erythromycin Nausea Only    Medications   Current Facility-Administered Medications:    0.9 %  sodium chloride  infusion, , Intravenous, Continuous, Tobie Mario GAILS, MD, Last Rate: 75 mL/hr at 05/29/24 2030, New Bag at 05/29/24 2030   acetaminophen  (TYLENOL ) tablet 650 mg, 650 mg, Oral, Q4H PRN **OR** acetaminophen  (TYLENOL ) suppository 650 mg, 650 mg, Rectal, Q4H PRN, Tobie Mario GAILS, MD   dexamethasone  (DECADRON ) injection 4 mg, 4 mg, Intravenous, Q12H, Taj Arteaga M, MD   insulin  aspart (novoLOG ) injection 0-9 Units, 0-9 Units, Subcutaneous,  Q4H, Tobie Mario GAILS, MD, 1 Units at 05/29/24 2031   lacosamide  (VIMPAT ) 200 mg in sodium chloride  0.9 % 25 mL IVPB, 200 mg, Intravenous, Q12H, Britany Callicott M, MD   levETIRAcetam  (KEPPRA ) undiluted injection 1,500 mg, 1,500 mg, Intravenous, Q12H, Kaedance Magos M, MD   LORazepam  (ATIVAN ) injection 4 mg, 4 mg, Intravenous, Q5 Min x 2 PRN, Tobie Mario GAILS, MD   ondansetron  (ZOFRAN ) tablet 4 mg, 4 mg, Oral, Q6H  PRN **OR** ondansetron  (ZOFRAN ) injection 4 mg, 4 mg, Intravenous, Q6H PRN, Tobie Mario GAILS, MD   Oral care mouth rinse, 15 mL, Mouth Rinse, PRN, Tobie Mario GAILS, MD   phenytoin  (DILANTIN ) 150 mg in sodium chloride  0.9 % 25 mL IVPB, 150 mg, Intravenous, BID, Baker Kogler M, MD   polyethylene glycol (MIRALAX / GLYCOLAX) packet 17 g, 17 g, Oral, Daily PRN, Patel, Ekta V, MD  Facility-Administered Medications Ordered in Other Encounters:    influenza vaccine adjuvanted (FLUAD) injection 0.5 mL, 0.5 mL, Intramuscular, Once, Vaslow, Zachary K, MD  Vitals   Vitals:   12-Jun-2024 1530 12-Jun-2024 1604 2024/06/12 1719 06-12-24 2017  BP: 122/65  106/61 138/74  Pulse: 60  61 66  Resp: 18  18 18   Temp:  97.9 F (36.6 C)  (!) 97.5 F (36.4 C)  TempSrc:  Oral  Oral  SpO2: 97%  96%   Weight:      Height:        Body mass index is 22.81 kg/m.   Physical Exam   Constitutional: Sleeping Eyes: No scleral injection.  HENT: No OP obstruction.  Head: Normocephalic.  Cardiovascular: Normal rate and regular rhythm.  Respiratory: Effort normal, non-labored breathing. On RA. GI: Soft.  No distension.  Skin: WDI.   Neurologic Examination   Mental status: Sleeping. Cranial nerves: R facial droop. Motor: Spontaneous LUE antigravity. Sensory: Deferred noxious Reflexes: DTR's deferred.  Coordination: Deferred Gait: Deferred   Labs/Imaging/Neurodiagnostic studies   CBC:  Recent Labs  Lab 06-12-2024 1028  WBC 8.0  NEUTROABS 6.2  HGB 12.5  HCT 38.2  MCV 77.3*  PLT 158   Basic Metabolic Panel:  Lab Results   Component Value Date   NA 136 2024/06/12   K 3.2 (L) 2024/06/12   CO2 25 2024/06/12   GLUCOSE 151 (H) 2024/06/12   BUN 13 2024-06-12   CREATININE 0.69 2024-06-12   CALCIUM  8.7 (L) 2024-06-12   GFRNONAA >60 2024/06/12   GFRAA >60 10/23/2019   Lipid Panel:  Lab Results  Component Value Date   LDLCALC 66 04/05/2022   HgbA1c:  Lab Results  Component Value Date   HGBA1C 7.7 (H) 2024-06-12   Urine Drug Screen:     Component Value Date/Time   LABOPIA NONE DETECTED 05/07/2024 0809   COCAINSCRNUR NONE DETECTED 05/07/2024 0809   LABBENZ NONE DETECTED 05/07/2024 0809   AMPHETMU NONE DETECTED 05/07/2024 0809   THCU NONE DETECTED 05/07/2024 0809   LABBARB NONE DETECTED 05/07/2024 0809    Alcohol Level     Component Value Date/Time   ETH <15 05/06/2024 0923   INR  Lab Results  Component Value Date   INR 0.9 05/06/2024   APTT  Lab Results  Component Value Date   APTT 38 (H) 05/06/2024   AED levels:  Lab Results  Component Value Date   PHENYTOIN  16.8 06-12-2024   LEVETIRACETA <2.0 (L) 03/06/2024    CT Head without contrast(Personally reviewed): L parietal lesion increased in size from 3.9 x 3.4cm -> 4.3 x 3.9cm.  ASSESSMENT   Yvonne [name] is a 78 y.o. female with hx of GBM (L parietal lobe) c/b progression & seizures, currently in hospice, who presents with breakthrough seizure (focal seizure: R face and arm jerking). CTH with progression, likely trigger of seizure. Discussed case with Dr. Buckley.   RECOMMENDATIONS  Maximize current ASMs: Keppra  1g -> 1.5g BID, Vimpat  100mg  -> 200mg  BID Start dilantin  maintenance 150mg  BID & observe if overly sedating Increase home Decadron   2mg  to 4mg  BID (s/p 10mg  load)  ______________________________________________________________________    Signed, Normie CHRISTELLA Blower, MD [facility]

## 2024-05-29 NOTE — ED Provider Notes (Signed)
 [place] EMERGENCY DEPARTMENT AT [facility] Provider Note  CSN: [id number] Arrival date & time: 05/29/24 1007  Chief Complaint(s) Seizures  HPI BRET STAMOUR is a 78 y.o. female with history of glioblastoma currently in hospice care, recurrent seizures on lacosamide  and Keppra  at home, compliant with medications.  Daughter at bedside.  Patient was doing well this morning, ate breakfast.  Family began to notice some right-sided facial twitching.   Past Medical History Past Medical History:  Diagnosis Date   Anxiety    Chest pressure    Degenerative arthritis    Deviated septum    Diabetes mellitus without complication (HCC)    diet controlled   Diabetic neuropathy (HCC) 12/03/2017   DJD (degenerative joint disease) of knee    Family history of bladder cancer    Family history of brain cancer    Family history of breast cancer    Family history of ovarian cancer    Family history of prostate cancer    Family history of stomach cancer    Family history of throat cancer    Family history of uterine cancer    Graves disease    RADIOACTIVE IODINE 1983   H/O hiatal hernia    Heart palpitations    History of diastolic dysfunction    per echo in 2011   Hyperlipidemia    Hypertension    Hypothyroidism    LVH (left ventricular hypertrophy)    per echo in 2011   Mitral valve prolapse    Neuropathy, peripheral    DR. JENEL 12/15   Normal nuclear stress test 2011   OSA (obstructive sleep apnea)    Polyneuropathy in other diseases classified elsewhere 05/21/2013   Rosacea 2019   MELANOMA CHEST REMOVED IN SITU   Sleep difficulties    Tinnitus    Patient Active Problem List   Diagnosis Date Noted   Seizure (HCC) 05/06/2024   Glioblastoma of temporal lobe (HCC) 08/17/2022   Chronic serous otitis media of left ear 06/26/2022   Eustachian tube dysfunction, left 06/26/2022   Glioblastoma, IDH-wildtype (HCC) 05/03/2022   Brain lesion 04/10/2022   Acute  ischemic stroke (HCC) 04/04/2022   Osteoarthritis of left knee 11/24/2021   Osteoarthritis of right knee 11/24/2021   Excessive cerumen in ear canal, right 05/29/2021   Sensorineural hearing loss (SNHL), bilateral 05/29/2021   Family history of brain cancer    Family history of throat cancer    Family history of breast cancer    Family history of ovarian cancer    Family history of prostate cancer    Family history of stomach cancer    Family history of bladder cancer    Family history of uterine cancer    Anxiety 10/27/2020   Daytime somnolence 10/27/2020   Diabetic peripheral neuropathy associated with type 2 diabetes mellitus (HCC) 10/27/2020   Excessive sleepiness 10/27/2020   Hypertension 10/27/2020   Obstructive sleep apnea syndrome 10/27/2020   Peripheral neuropathy 10/27/2020   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 10/27/2020   Rosacea 10/27/2020   Vitamin D  deficiency 10/27/2020   Deviated septum 05/09/2020   Trochanteric bursitis of left hip 12/30/2019   Special screening for malignant neoplasms, colon    Diverticulosis of colon without hemorrhage    Family history of pancreatic cancer    Pancreatic cyst    Osteoarthritis of knee 12/04/2017   Diabetic neuropathy (HCC) 12/03/2017   Newly diagnosed diabetes (HCC) 06/20/2017   Bilateral hearing loss 05/28/2017  Bilateral impacted cerumen 05/28/2017   Seasonal allergic rhinitis 05/28/2017   Tinnitus, bilateral 05/28/2017   Genetic testing 10/15/2016   DM2 (diabetes mellitus, type 2) (HCC) 01/10/2016   Polyneuropathy in other diseases classified elsewhere 05/21/2013   History of hyperglycemia 04/01/2012   Atypical chest pain 12/28/2011   Benign hypertensive heart disease without heart failure 02/05/2011   Hyperlipidemia 02/05/2011   Hypothyroidism 02/05/2011   Palpitations 02/05/2011   Home Medication(s) Prior to Admission medications   Medication Sig Start Date End Date Taking? Authorizing Provider   acetaminophen  (TYLENOL ) 500 MG tablet Take 1,000 mg by mouth every 8 (eight) hours as needed for moderate pain.    [provider]  dexamethasone  (DECADRON ) 2 MG tablet Take 1 tablet (2 mg total) by mouth daily. 05/10/24   Samtani, Jai-Gurmukh, MD  diphenhydrAMINE-zinc acetate (BENADRYL) cream Apply topically 2 (two) times daily as needed for itching. 05/10/24   Samtani, Jai-Gurmukh, MD  empagliflozin (JARDIANCE) 10 MG TABS tablet Take 1 tablet by mouth once a day 05/18/24     fluticasone  (FLONASE ) 50 MCG/ACT nasal spray Place 2 sprays into both nostrils at bedtime as needed for allergies.    [provider]  hydrocortisone cream 1 % Apply small amount to affected area 2-3 daily as needed for itching 05/12/24     JARDIANCE 10 MG TABS tablet Take 10 mg by mouth in the morning. 10/05/22   [provider]  Lacosamide  100 MG TABS Take 1 tablet (100 mg total) by mouth 2 (two) times daily. 05/10/24   Samtani, Jai-Gurmukh, MD  levETIRAcetam  (KEPPRA ) 1000 MG tablet Take 1 tablet (1,000 mg total) by mouth 2 (two) times daily. 05/10/24   Samtani, Jai-Gurmukh, MD  levothyroxine  (SYNTHROID ) 88 MCG tablet Take 88 mcg by mouth daily before breakfast.    [provider]  magnesium hydroxide (MILK OF MAGNESIA) 400 MG/5ML suspension Take 30 mLs by mouth daily as needed for constipation. 05/12/24     Morphine Sulfate (MORPHINE CONCENTRATE) 10 mg / 0.5 ml concentrated solution Take 0.25ml by mouth every four hours as needed for pain or shortness of breath. 05/12/24     nitrofurantoin, macrocrystal-monohydrate, (MACROBID) 100 MG capsule Take 1 capsule (100 mg total) by mouth 2 (two) times daily for 7 days. 05/22/24 05/29/24    ondansetron  (ZOFRAN ) 8 MG tablet Take 1 tablet (8 mg total) by mouth every 8 (eight) hours as needed for nausea or vomiting. 10/31/23   Vaslow, Zachary K, MD  prochlorperazine  (COMPAZINE ) 10 MG tablet Take 1 tablet (10 mg total) by mouth every 6 (six) hours as needed for  nausea or vomiting. 07/26/22   Vaslow, Zachary K, MD  sennosides-docusate sodium  (SENOKOT-S) 8.6-50 MG tablet Take 1-4 tablets by mouth twice daily as needed for constipation. 05/19/24  Past Surgical History Past Surgical History:  Procedure Laterality Date   ABDOMINAL HYSTERECTOMY  2000   TOTAL   APPENDECTOMY  1989   Brain biospy     CARDIOVASCULAR STRESS TEST  11/09/2009   EF 75%   CARPAL TUNNEL RELEASE  2002   bilateral   COLONOSCOPY WITH PROPOFOL  N/A 10/23/2018   Procedure: COLONOSCOPY WITH PROPOFOL ;  Surgeon: Teressa Toribio SQUIBB, MD;  Location: [facility];  Service: Endoscopy;  Laterality: N/A;   crainectomy     cranectomy     ESOPHAGOGASTRODUODENOSCOPY N/A 11/16/2021   Procedure: ESOPHAGOGASTRODUODENOSCOPY (EGD);  Surgeon: Teressa Toribio SQUIBB, MD;  Location: [facility];  Service: Endoscopy;  Laterality: N/A;   ESOPHAGOGASTRODUODENOSCOPY (EGD) WITH PROPOFOL  N/A 10/23/2018   Procedure: ESOPHAGOGASTRODUODENOSCOPY (EGD) WITH PROPOFOL ;  Surgeon: Teressa Toribio SQUIBB, MD;  Location: [facility];  Service: Endoscopy;  Laterality: N/A;   EUS N/A 10/23/2018   Procedure: UPPER ENDOSCOPIC ULTRASOUND (EUS) RADIAL;  Surgeon: Teressa Toribio SQUIBB, MD;  Location: [facility];  Service: Endoscopy;  Laterality: N/A;   EUS N/A 11/16/2021   Procedure: UPPER ENDOSCOPIC ULTRASOUND (EUS) RADIAL;  Surgeon: Teressa Toribio SQUIBB, MD;  Location: [facility];  Service: Endoscopy;  Laterality: N/A;   MASS EXCISION Left 06/02/2019   Procedure: EXCISION OF SUBCUTANEOUS ABDOMINAL WALL MASSES LEFT LOWER QUADRANT;  Surgeon: Belinda Cough, MD;  Location: [facility];  Service: General;  Laterality: Left;   TONSILLECTOMY  1958   TRANSTHORACIC ECHOCARDIOGRAM  11/01/2009   NORMAL LV FILLING AND DIASTOLIC DYSFUNCTION AND MILD AORTIC SCLEROSIS AND TRACE MITRAL REGURGITATION   Family History Family  History  Problem Relation Age of Onset   Hypertension Mother    Heart disease Mother    Dementia Mother    Heart disease Father    Arthritis Father    Diabetes Father    Hypertension Father    Pancreatic cancer Brother 29       pancreatic   Other Brother        automobile accident   Throat cancer Maternal Uncle    Brain cancer Paternal Aunt 60       malignant brain cancer   Stroke Maternal Grandmother    Heart disease Maternal Grandfather    Pancreatic cancer Cousin 75       pancreatic (maternal first cousin)   Pancreatic cancer Cousin 27       pancreatic, smoker (maternal first cousin)   Breast cancer Cousin 34       breast (maternal first cousin)   Ovarian cancer Cousin    Uterine cancer Cousin 30       uterine (paternal first cousin)   Cancer Cousin 69       unknown primary (paternal first cousin)   Bladder Cancer Cousin 5       (paternal first cousin)   Lung cancer Cousin    Throat cancer Cousin    Prostate cancer Cousin        (maternal first cousin)   Stomach cancer Cousin 65       (maternal first cousin)   Cancer Other 80       pancreatic neuroendocrine carcinoma   Colon cancer Neg Hx    Neuropathy Neg Hx     Social History Social History   Tobacco Use   Smoking status: Former    Current packs/day: 0.00    Average packs/day: 0.5 packs/day for 10.0 years (5.0 ttl pk-yrs)    Types: Cigarettes    Start date: 08/13/1965    Quit date: 08/14/1975  Years since quitting: 48.8   Smokeless tobacco: Never  Vaping Use   Vaping status: Never Used  Substance Use Topics   Alcohol use: Not Currently    Comment: one glass of wine a week   Drug use: No   Allergies Tobramycin, Sulfa antibiotics, and Erythromycin  Review of Systems Review of Systems  Physical Exam Vital Signs  I have reviewed the triage vital signs BP 137/68   Pulse 61   Resp 19   Ht 5' 8 (1.727 m)   Wt 68 kg   SpO2 100%   BMI 22.81 kg/m   Physical Exam Vitals and nursing note  reviewed.  Constitutional:      Appearance: Normal appearance.  HENT:     Mouth/Throat:     Mouth: Mucous membranes are moist.  Eyes:     Pupils: Pupils are equal, round, and reactive to light.  Pulmonary:     Effort: Pulmonary effort is normal.  Abdominal:     General: Abdomen is flat.  Musculoskeletal:        General: No swelling or deformity.     Cervical back: Normal range of motion.  Neurological:     Mental Status: She is alert.     Comments: Patient with persistent right sided facial twitching.     ED Results and Treatments Labs (all labs ordered are listed, but only abnormal results are displayed) Labs Reviewed  COMPREHENSIVE METABOLIC PANEL WITH GFR - Abnormal; Notable for the following components:      Result Value   Potassium 3.2 (*)    Glucose, Bld 151 (*)    Calcium  8.7 (*)    Total Protein 5.5 (*)    Albumin 3.1 (*)    All other components within normal limits  CBC WITH DIFFERENTIAL/PLATELET - Abnormal; Notable for the following components:   MCV 77.3 (*)    MCH 25.3 (*)    All other components within normal limits  URINALYSIS, W/ REFLEX TO CULTURE (INFECTION SUSPECTED) - Abnormal; Notable for the following components:   APPearance TURBID (*)    Glucose, UA >=500 (*)    Hgb urine dipstick MODERATE (*)    Leukocytes,Ua MODERATE (*)    Bacteria, UA RARE (*)    All other components within normal limits  RESP PANEL BY RT-PCR (RSV, FLU A&B, COVID)  RVPGX2  URINE CULTURE  MAGNESIUM                                                                                                                          Radiology DG Chest Portable 1 View Result Date: 05/29/2024 EXAM: 1 VIEW(S) XRAY OF THE CHEST 05/29/2024 11:32:00 AM COMPARISON: 09/27/2017 CLINICAL HISTORY: cough. Reason for exam: cough ; Per triage notes: Patient presents to the ER via EMS from home with seizures. Patient has a hx of seizure and is on Keppra  at home. Patient normally awake, alert and  orientated. Patient responsive to pain due to the 5mg  Versed  per  EMS. Patient is a ; DNR. FINDINGS: LUNGS AND PLEURA: No focal pulmonary opacity. No pulmonary edema. No pleural effusion. No pneumothorax. HEART AND MEDIASTINUM: No acute abnormality of the cardiac and mediastinal silhouettes. BONES AND SOFT TISSUES: No acute osseous abnormality. IMPRESSION: 1. No acute cardiopulmonary abnormality. Electronically signed by: Lynwood Seip MD 05/29/2024 11:51 AM EDT RP Workstation: [id number]   CT Head Wo Contrast Result Date: 05/29/2024 EXAM: CT HEAD WITHOUT CONTRAST 05/29/2024 11:21:00 AM TECHNIQUE: CT of the head was performed without the administration of intravenous contrast. Automated exposure control, iterative reconstruction, and/or weight based adjustment of the mA/kV was utilized to reduce the radiation dose to as low as reasonably achievable. COMPARISON: CT of the head dated 05/06/2024. CLINICAL HISTORY: Seizure disorder, clinical change. Chief complaints; Seizures. FINDINGS: BRAIN AND VENTRICLES: No acute hemorrhage. No evidence of acute infarct. No hydrocephalus. No extra-axial collection. No mass effect or midline shift. Mixed attenuation lesion within the left parietal lobe has mildly increased in size in the interim from approximately 3.9 x 3.4 cm to approximately 4.3 x 3.9 cm. It has decreased in density in the interim along its anteromedial margin. There are chronic encephalomalacia changes again demonstrated within the left temporal lobe and an overlying craniotomy. ORBITS: No acute abnormality. SINUSES: Moderate opacification of the sphenoid sinus. Mild opacification within the maxillary sinuses bilaterally. SOFT TISSUES AND SKULL: No acute soft tissue abnormality. No skull fracture. Overlying craniotomy. IMPRESSION: 1. Mixed attenuation lesion within the left parietal lobe has mildly increased in size in the interim from approximately 3.9 x 3.4 cm to approximately 4.3 x 3.9 cm, with decreased  density along its anteromedial margin, presumably secondary to focal infarct/necrosis . 2. Chronic encephalomalacia changes within the left temporal lobe and an overlying craniotomy. Electronically signed by: Evalene Coho MD 05/29/2024 11:39 AM EDT RP Workstation: [id number]    Pertinent labs & imaging results that were available during my care of the patient were reviewed by me and considered in my medical decision making (see MDM for details).  Medications Ordered in ED Medications  potassium chloride  10 mEq in 100 mL IVPB (has no administration in time range)  cefTRIAXone (ROCEPHIN) 1 g in sodium chloride  0.9 % 100 mL IVPB (has no administration in time range)  LORazepam  (ATIVAN ) injection 1 mg (1 mg Intravenous Given 05/29/24 1022)  dexamethasone  (DECADRON ) injection 10 mg (10 mg Intravenous Given 05/29/24 1029)  fosPHENYtoin  (CEREBYX ) 1,360 mg PE in sodium chloride  0.9 % 50 mL IVPB (0 mg PE Intravenous Stopped 05/29/24 1202)  levETIRAcetam  (KEPPRA ) undiluted injection 4,000 mg (4,000 mg Intravenous Given 05/29/24 1030)                                                                                                                                     Procedures .Critical Care  Performed by: Mannie Fairy DASEN, DO Authorized by: Mannie Fairy DASEN, DO   Critical care provider statement:  Critical care time (minutes):  79   Critical care was necessary to treat or prevent imminent or life-threatening deterioration of the following conditions:  CNS failure or compromise   Critical care was time spent personally by me on the following activities:  Development of treatment plan with patient or surrogate, discussions with consultants, evaluation of patient's response to treatment, examination of patient, ordering and review of laboratory studies, ordering and review of radiographic studies, ordering and performing treatments and interventions, pulse oximetry, re-evaluation of patient's  condition and review of old charts   (including critical care time)  Medical Decision Making / ED Course   This patient presents to the ED for concern of focal seizure, this involves an extensive number of treatment options, and is a complaint that carries with it a high risk of complications and morbidity.  The differential diagnosis includes focal seizure, mass effect, glioblastoma, cerebral hemorrhage.  MDM: Unfortunately, patient's glioblastoma likely the primary driver of this recurrent focal seizure.  Her daughter is at bedside, has a very realistic expectation of the patient's long-term prognosis.  Daughter and patient are unsure about intubation.  Patient's goal reportedly is to pass away at home.  Daughter is hoping to get more quality time with the patient.  If the patient were to require hospitalization, daughter would be okay with that if it meant a better quality of life with her remaining time.  Patient responded well previously to Keppra  load and fosphenytoin .  Will provide those medications as well as small dose of Ativan .  I think the only real harm that we may cause this patient is reducing her respiratory drive with excessive benzodiazepines.  Will obtain CT imaging of the head.  Also provide steroids.  Reassessment 12 PM-patient's chest x-ray negative for acute process.  CT imaging of the head shows mild increase in size of glioblastoma.  There is no obvious intracranial hemorrhage or mass effect.  Patient has responded well to fosphenytoin .  She is not postictal.  Potassium a bit low, will begin to replete.  Her urine is questionable for infection.  She had been having a Foley catheter, this may be colonized.  Replace patient's Foley catheter and will treat with Rocephin.  Admit patient to [facility] medicine teaching service.   Additional history obtained: -Additional history obtained from daughter about -External records from outside source obtained and reviewed including:  Chart review including previous notes, labs, imaging, consultation notes   Lab Tests: -I ordered, reviewed, and interpreted labs.   The pertinent results include:   Labs Reviewed  COMPREHENSIVE METABOLIC PANEL WITH GFR - Abnormal; Notable for the following components:      Result Value   Potassium 3.2 (*)    Glucose, Bld 151 (*)    Calcium  8.7 (*)    Total Protein 5.5 (*)    Albumin 3.1 (*)    All other components within normal limits  CBC WITH DIFFERENTIAL/PLATELET - Abnormal; Notable for the following components:   MCV 77.3 (*)    MCH 25.3 (*)    All other components within normal limits  URINALYSIS, W/ REFLEX TO CULTURE (INFECTION SUSPECTED) - Abnormal; Notable for the following components:   APPearance TURBID (*)    Glucose, UA >=500 (*)    Hgb urine dipstick MODERATE (*)    Leukocytes,Ua MODERATE (*)    Bacteria, UA RARE (*)    All other components within normal limits  RESP PANEL BY RT-PCR (RSV, FLU A&B, COVID)  RVPGX2  URINE CULTURE  MAGNESIUM      Imaging Studies ordered: I ordered imaging studies including CT head, chest x-ray I independently visualized and interpreted imaging. I agree with the radiologist interpretation   Medicines ordered and prescription drug management: Meds ordered this encounter  Medications   DISCONTD: levETIRAcetam  (KEPPRA ) undiluted injection 1,500 mg   LORazepam  (ATIVAN ) injection 1 mg   dexamethasone  (DECADRON ) injection 10 mg   fosPHENYtoin  (CEREBYX ) 1,360 mg PE in sodium chloride  0.9 % 50 mL IVPB   levETIRAcetam  (KEPPRA ) undiluted injection 4,000 mg   potassium chloride  10 mEq in 100 mL IVPB   cefTRIAXone (ROCEPHIN) 1 g in sodium chloride  0.9 % 100 mL IVPB    Antibiotic Indication::   UTI    -I have reviewed the patients home medicines and have made adjustments as needed  Critical interventions Management of status epilepticus   Cardiac Monitoring: The patient was maintained on a cardiac monitor.  I personally viewed and  interpreted the cardiac monitored which showed an underlying rhythm of: Normal sinus rhythm  Social Determinants of Health:  Factors impacting patients care include: Multiple medical comorbidities including glioblastoma   Reevaluation: After the interventions noted above, I reevaluated the patient and found that they have :improved  Co morbidities that complicate the patient evaluation  Past Medical History:  Diagnosis Date   Anxiety    Chest pressure    Degenerative arthritis    Deviated septum    Diabetes mellitus without complication (HCC)    diet controlled   Diabetic neuropathy (HCC) 12/03/2017   DJD (degenerative joint disease) of knee    Family history of bladder cancer    Family history of brain cancer    Family history of breast cancer    Family history of ovarian cancer    Family history of prostate cancer    Family history of stomach cancer    Family history of throat cancer    Family history of uterine cancer    Graves disease    RADIOACTIVE IODINE 1983   H/O hiatal hernia    Heart palpitations    History of diastolic dysfunction    per echo in 2011   Hyperlipidemia    Hypertension    Hypothyroidism    LVH (left ventricular hypertrophy)    per echo in 2011   Mitral valve prolapse    Neuropathy, peripheral    DR. JENEL 12/15   Normal nuclear stress test 2011   OSA (obstructive sleep apnea)    Polyneuropathy in other diseases classified elsewhere 05/21/2013   Rosacea 2019   MELANOMA CHEST REMOVED IN SITU   Sleep difficulties    Tinnitus       Final Clinical Impression(s) / ED Diagnoses Final diagnoses:  Glioblastoma (HCC)  Status epilepticus (HCC)     @PCDICTATION @    Mannie Pac T, DO 05/29/24 1212

## 2024-05-29 NOTE — H&P (Signed)
 History and Physical    Patient: Yvonne [name] FMW:[id number] DOB: 30-Jun-1946 DOA: 05/29/2024 DOS: the patient was seen and examined on 05/29/2024 . PCP: Cleotilde Planas, MD  Patient coming from: Home Chief complaint: Chief Complaint  Patient presents with   Seizures   HPI:  Yvonne [name] is a 78 y.o. female with past medical history  of  stage IV glioblastoma temporal lobe on prior Atezolizumab/Avastin-recent infusion 04/22/2024-follows also with [facility] clinical study [facility] --Dr. Esteban Recent progression on MRI 04/22/2024, On prophylactic Vimpat  50 twice daily at home, Diabetes, Graves' disease, diabetic neuropathy, OSA, presenting with seizures. Code status is unclear. Will d/w family.  Patient was recently admitted on 24 September and discharged on 28 September with same presentation.  Patient has a indwelling Foley.   ED Course:  Vital signs in the ED were notable for the following:  Vitals:   05/29/24 1200 05/29/24 1203 05/29/24 1230 05/29/24 1330  BP: 125/82  (!) 141/76 128/72  Pulse: 66  (!) 58 60  Temp: 98 F (36.7 C)     Resp: 16  13 14   Height:      Weight:      SpO2: 97% 98% 99% 96%  BMI (Calculated):       >>ED evaluation thus far shows: CMP shows glucose 151 potassium 3.2 replaced in the ED, LFTs within normal limits.   CBC shows normal white count hemoglobin and platelets 158 MCV of 77.12. Urinalysis today showed turbid urine with glucose moderate hemoglobin and moderate leukocyte esterase more than 50 WBC and RBC. Head ct shows :increase in size of GBM.   >>While in the ED patient received the following: Medications  potassium chloride  10 mEq in 100 mL IVPB (has no administration in time range)  cefTRIAXone (ROCEPHIN) 1 g in sodium chloride  0.9 % 100 mL IVPB (has no administration in time range)  LORazepam  (ATIVAN ) injection 1 mg (1 mg Intravenous Given 05/29/24 1022)  dexamethasone  (DECADRON ) injection 10 mg (10 mg Intravenous Given 05/29/24 1029)   fosPHENYtoin  (CEREBYX ) 1,360 mg PE in sodium chloride  0.9 % 50 mL IVPB (0 mg PE Intravenous Stopped 05/29/24 1202)  levETIRAcetam  (KEPPRA ) undiluted injection 4,000 mg (4,000 mg Intravenous Given 05/29/24 1030)   ROS Past Medical History:  Diagnosis Date   Anxiety    Chest pressure    Degenerative arthritis    Deviated septum    Diabetes mellitus without complication (HCC)    diet controlled   Diabetic neuropathy (HCC) 12/03/2017   DJD (degenerative joint disease) of knee    Family history of bladder cancer    Family history of brain cancer    Family history of breast cancer    Family history of ovarian cancer    Family history of prostate cancer    Family history of stomach cancer    Family history of throat cancer    Family history of uterine cancer    Graves disease    RADIOACTIVE IODINE 1983   H/O hiatal hernia    Heart palpitations    History of diastolic dysfunction    per echo in 2011   Hyperlipidemia    Hypertension    Hypothyroidism    LVH (left ventricular hypertrophy)    per echo in 2011   Mitral valve prolapse    Neuropathy, peripheral    DR. JENEL 12/15   Normal nuclear stress test 2011   OSA (obstructive sleep apnea)    Polyneuropathy in other diseases classified elsewhere 05/21/2013  Rosacea 2019   MELANOMA CHEST REMOVED IN SITU   Sleep difficulties    Tinnitus    Past Surgical History:  Procedure Laterality Date   ABDOMINAL HYSTERECTOMY  2000   TOTAL   APPENDECTOMY  1989   Brain biospy     CARDIOVASCULAR STRESS TEST  11/09/2009   EF 75%   CARPAL TUNNEL RELEASE  2002   bilateral   COLONOSCOPY WITH PROPOFOL  N/A 10/23/2018   Procedure: COLONOSCOPY WITH PROPOFOL ;  Surgeon: Teressa Toribio SQUIBB, MD;  Location: [facility];  Service: Endoscopy;  Laterality: N/A;   crainectomy     cranectomy     ESOPHAGOGASTRODUODENOSCOPY N/A 11/16/2021   Procedure: ESOPHAGOGASTRODUODENOSCOPY (EGD);  Surgeon: Teressa Toribio SQUIBB, MD;  Location: [facility];  Service:  Endoscopy;  Laterality: N/A;   ESOPHAGOGASTRODUODENOSCOPY (EGD) WITH PROPOFOL  N/A 10/23/2018   Procedure: ESOPHAGOGASTRODUODENOSCOPY (EGD) WITH PROPOFOL ;  Surgeon: Teressa Toribio SQUIBB, MD;  Location: [facility];  Service: Endoscopy;  Laterality: N/A;   EUS N/A 10/23/2018   Procedure: UPPER ENDOSCOPIC ULTRASOUND (EUS) RADIAL;  Surgeon: Teressa Toribio SQUIBB, MD;  Location: [facility];  Service: Endoscopy;  Laterality: N/A;   EUS N/A 11/16/2021   Procedure: UPPER ENDOSCOPIC ULTRASOUND (EUS) RADIAL;  Surgeon: Teressa Toribio SQUIBB, MD;  Location: [facility];  Service: Endoscopy;  Laterality: N/A;   MASS EXCISION Left 06/02/2019   Procedure: EXCISION OF SUBCUTANEOUS ABDOMINAL WALL MASSES LEFT LOWER QUADRANT;  Surgeon: Belinda Cough, MD;  Location: [facility];  Service: General;  Laterality: Left;   TONSILLECTOMY  1958   TRANSTHORACIC ECHOCARDIOGRAM  11/01/2009   NORMAL LV FILLING AND DIASTOLIC DYSFUNCTION AND MILD AORTIC SCLEROSIS AND TRACE MITRAL REGURGITATION    reports that she quit smoking about 48 years ago. Her smoking use included cigarettes. She started smoking about 58 years ago. She has a 5 pack-year smoking history. She has never used smokeless tobacco. She reports that she does not currently use alcohol. She reports that she does not use drugs. Allergies  Allergen Reactions   Tobramycin Hives   Sulfa Antibiotics Other (See Comments)    Stomach upset   Erythromycin Nausea Only   Family History  Problem Relation Age of Onset   Hypertension Mother    Heart disease Mother    Dementia Mother    Heart disease Father    Arthritis Father    Diabetes Father    Hypertension Father    Pancreatic cancer Brother 39       pancreatic   Other Brother        automobile accident   Throat cancer Maternal Uncle    Brain cancer Paternal Aunt 56       malignant brain cancer   Stroke Maternal Grandmother    Heart disease Maternal Grandfather    Pancreatic cancer Cousin 51       pancreatic (maternal  first cousin)   Pancreatic cancer Cousin 33       pancreatic, smoker (maternal first cousin)   Breast cancer Cousin 57       breast (maternal first cousin)   Ovarian cancer Cousin    Uterine cancer Cousin 53       uterine (paternal first cousin)   Cancer Cousin 76       unknown primary (paternal first cousin)   Bladder Cancer Cousin 78       (paternal first cousin)   Lung cancer Cousin    Throat cancer Cousin    Prostate cancer Cousin        (maternal first  cousin)   Stomach cancer Cousin 52       (maternal first cousin)   Cancer Other 89       pancreatic neuroendocrine carcinoma   Colon cancer Neg Hx    Neuropathy Neg Hx    Prior to Admission medications   Medication Sig Start Date End Date Taking? Authorizing Provider  acetaminophen  (TYLENOL ) 500 MG tablet Take 1,000 mg by mouth every 8 (eight) hours as needed for moderate pain.    [provider]  dexamethasone  (DECADRON ) 2 MG tablet Take 1 tablet (2 mg total) by mouth daily. 05/10/24   Samtani, Jai-Gurmukh, MD  diphenhydrAMINE-zinc acetate (BENADRYL) cream Apply topically 2 (two) times daily as needed for itching. 05/10/24   Samtani, Jai-Gurmukh, MD  empagliflozin (JARDIANCE) 10 MG TABS tablet Take 1 tablet by mouth once a day 05/18/24     fluticasone  (FLONASE ) 50 MCG/ACT nasal spray Place 2 sprays into both nostrils at bedtime as needed for allergies.    [provider]  hydrocortisone cream 1 % Apply small amount to affected area 2-3 daily as needed for itching 05/12/24     JARDIANCE 10 MG TABS tablet Take 10 mg by mouth in the morning. 10/05/22   [provider]  Lacosamide  100 MG TABS Take 1 tablet (100 mg total) by mouth 2 (two) times daily. 05/10/24   Samtani, Jai-Gurmukh, MD  levETIRAcetam  (KEPPRA ) 1000 MG tablet Take 1 tablet (1,000 mg total) by mouth 2 (two) times daily. 05/10/24   Samtani, Jai-Gurmukh, MD  levothyroxine  (SYNTHROID ) 88 MCG tablet Take 88 mcg by mouth daily before breakfast.     [provider]  magnesium hydroxide (MILK OF MAGNESIA) 400 MG/5ML suspension Take 30 mLs by mouth daily as needed for constipation. 05/12/24     Morphine Sulfate (MORPHINE CONCENTRATE) 10 mg / 0.5 ml concentrated solution Take 0.25ml by mouth every four hours as needed for pain or shortness of breath. 05/12/24     nitrofurantoin, macrocrystal-monohydrate, (MACROBID) 100 MG capsule Take 1 capsule (100 mg total) by mouth 2 (two) times daily for 7 days. 05/22/24 05/29/24    ondansetron  (ZOFRAN ) 8 MG tablet Take 1 tablet (8 mg total) by mouth every 8 (eight) hours as needed for nausea or vomiting. 10/31/23   Vaslow, Zachary K, MD  prochlorperazine  (COMPAZINE ) 10 MG tablet Take 1 tablet (10 mg total) by mouth every 6 (six) hours as needed for nausea or vomiting. 07/26/22   Vaslow, Zachary K, MD  sennosides-docusate sodium  (SENOKOT-S) 8.6-50 MG tablet Take 1-4 tablets by mouth twice daily as needed for constipation. 05/19/24                                                                                    Vitals:   05/29/24 1200 05/29/24 1203 05/29/24 1230 05/29/24 1330  BP: 125/82  (!) 141/76 128/72  Pulse: 66  (!) 58 60  Resp: 16  13 14   Temp: 98 F (36.7 C)     SpO2: 97% 98% 99% 96%  Weight:      Height:       Physical Exam  Labs on Admission: I have personally reviewed following labs and imaging studies  CBC: Recent Labs  Lab 05/29/24 1028  WBC 8.0  NEUTROABS 6.2  HGB 12.5  HCT 38.2  MCV 77.3*  PLT 158   Basic Metabolic Panel: Recent Labs  Lab 05/29/24 1028  NA 136  K 3.2*  CL 99  CO2 25  GLUCOSE 151*  BUN 13  CREATININE 0.69  CALCIUM  8.7*  MG 1.7   GFR: Estimated Creatinine Clearance: 60.4 mL/min (by C-G formula based on SCr of 0.69 mg/dL). Liver Function Tests: Recent Labs  Lab 05/29/24 1028  AST 22  ALT 21  ALKPHOS 49  BILITOT 0.3  PROT 5.5*  ALBUMIN 3.1*   No results for input(s): LIPASE, AMYLASE in the last 168 hours. No results for input(s):  AMMONIA in the last 168 hours. Recent Labs    08/12/23 1201 09/19/23 0834 12/02/23 0906 12/30/23 1127 03/06/24 1020 05/06/24 0923 05/06/24 0930 05/07/24 1028 05/09/24 1101 05/29/24 1028  BUN 23 19 16 19 19 15 16 19 17 13   CREATININE 0.74 0.73 0.70 0.76 0.62 0.68 0.60 0.76 0.77 0.69    Cardiac Enzymes: No results for input(s): CKTOTAL, CKMB, CKMBINDEX, TROPONINI in the last 168 hours. BNP (last 3 results) No results for input(s): PROBNP in the last 8760 hours. HbA1C: No results for input(s): HGBA1C in the last 72 hours. CBG: No results for input(s): GLUCAP in the last 168 hours. Lipid Profile: No results for input(s): CHOL, HDL, LDLCALC, TRIG, CHOLHDL, LDLDIRECT in the last 72 hours. Thyroid  Function Tests: No results for input(s): TSH, T4TOTAL, FREET4, T3FREE, THYROIDAB in the last 72 hours. Anemia Panel: No results for input(s): VITAMINB12, FOLATE, FERRITIN, TIBC, IRON, RETICCTPCT in the last 72 hours. Urine analysis:    Component Value Date/Time   COLORURINE YELLOW 05/29/2024 1026   APPEARANCEUR TURBID (A) 05/29/2024 1026   LABSPEC 1.025 05/29/2024 1026   PHURINE 5.0 05/29/2024 1026   GLUCOSEU >=500 (A) 05/29/2024 1026   HGBUR MODERATE (A) 05/29/2024 1026   BILIRUBINUR NEGATIVE 05/29/2024 1026   KETONESUR NEGATIVE 05/29/2024 1026   PROTEINUR NEGATIVE 05/29/2024 1026   NITRITE NEGATIVE 05/29/2024 1026   LEUKOCYTESUR MODERATE (A) 05/29/2024 1026   Radiological Exams on Admission: DG Chest Portable 1 View Result Date: 05/29/2024 EXAM: 1 VIEW(S) XRAY OF THE CHEST 05/29/2024 11:32:00 AM COMPARISON: 09/27/2017 CLINICAL HISTORY: cough. Reason for exam: cough ; Per triage notes: Patient presents to the ER via EMS from home with seizures. Patient has a hx of seizure and is on Keppra  at home. Patient normally awake, alert and orientated. Patient responsive to pain due to the 5mg  Versed  per EMS. Patient is a ; DNR. FINDINGS:  LUNGS AND PLEURA: No focal pulmonary opacity. No pulmonary edema. No pleural effusion. No pneumothorax. HEART AND MEDIASTINUM: No acute abnormality of the cardiac and mediastinal silhouettes. BONES AND SOFT TISSUES: No acute osseous abnormality. IMPRESSION: 1. No acute cardiopulmonary abnormality. Electronically signed by: Lynwood Seip MD 05/29/2024 11:51 AM EDT RP Workstation: [id number]   CT Head Wo Contrast Result Date: 05/29/2024 EXAM: CT HEAD WITHOUT CONTRAST 05/29/2024 11:21:00 AM TECHNIQUE: CT of the head was performed without the administration of intravenous contrast. Automated exposure control, iterative reconstruction, and/or weight based adjustment of the mA/kV was utilized to reduce the radiation dose to as low as reasonably achievable. COMPARISON: CT of the head dated 05/06/2024. CLINICAL HISTORY: Seizure disorder, clinical change. Chief complaints; Seizures. FINDINGS: BRAIN AND VENTRICLES: No acute hemorrhage. No evidence of acute infarct. No hydrocephalus. No extra-axial collection. No mass effect or midline shift. Mixed attenuation lesion within the  left parietal lobe has mildly increased in size in the interim from approximately 3.9 x 3.4 cm to approximately 4.3 x 3.9 cm. It has decreased in density in the interim along its anteromedial margin. There are chronic encephalomalacia changes again demonstrated within the left temporal lobe and an overlying craniotomy. ORBITS: No acute abnormality. SINUSES: Moderate opacification of the sphenoid sinus. Mild opacification within the maxillary sinuses bilaterally. SOFT TISSUES AND SKULL: No acute soft tissue abnormality. No skull fracture. Overlying craniotomy. IMPRESSION: 1. Mixed attenuation lesion within the left parietal lobe has mildly increased in size in the interim from approximately 3.9 x 3.4 cm to approximately 4.3 x 3.9 cm, with decreased density along its anteromedial margin, presumably secondary to focal infarct/necrosis . 2. Chronic  encephalomalacia changes within the left temporal lobe and an overlying craniotomy. Electronically signed by: Evalene Coho MD 05/29/2024 11:39 AM EDT RP Workstation: [id number]   Data Reviewed: Relevant notes from primary care and specialist visits, past discharge summaries as available in EHR, including Care Everywhere . Prior diagnostic testing as pertinent to current admission diagnoses, Updated medications and problem lists for reconciliation .ED course, including vitals, labs, imaging, treatment and response to treatment,Triage notes, nursing and pharmacy notes and ED provider's notes.Notable results as noted in HPI.Discussed case with EDMD/ ED APP/ or Specialty MD on call and as needed.  Assessment & Plan  >> Seizure/secondary to history of glioblastoma multiforme: Will admit to med/tele unit with cardiac monitoring and pulse oximetry. Appreciate neurology consult.  Will continue with Decadron  Q12, antiepileptics with Keppra .  Continue patient's Vimpat .  Aspiration and seizure and fall precautions.  Neurochecks every 4 hours.   >> Hypothyroidism: Continue Synthroid .  >> Essential hypertension: Will continue patient's metoprolol .  >> Diabetes mellitus type 2: Glycemic protocol.  Diet as tolerated will verify patient wants to be on a carb consistent diet or her preference.   >> UTI: Will continue with Rocephin. And follow culture sensitivity.  DVT prophylaxis:  SCDs Consults:  Neurology  Advance Care Planning:    Code Status: Full Code   Family Communication:  Daughter Disposition Plan:  Home Severity of Illness: The appropriate patient status for this patient is OBSERVATION. Observation status is judged to be reasonable and necessary in order to provide the required intensity of service to ensure the patient's safety. The patient's presenting symptoms, physical exam findings, and initial radiographic and laboratory data in the context of their medical condition is felt  to place them at decreased risk for further clinical deterioration. Furthermore, it is anticipated that the patient will be medically stable for discharge from the hospital within 2 midnights of admission.   Unresulted Labs (From admission, onward)     Start     Ordered   05/30/24 0500  Comprehensive metabolic panel  Tomorrow morning,   R        05/29/24 1245   05/30/24 0500  CBC  Tomorrow morning,   R        05/29/24 1245   05/29/24 1325  Hemoglobin A1c  Once,   R       Comments: To assess prior glycemic control    05/29/24 1324   05/29/24 1026  Urine Culture  Once,   R        05/29/24 1026            Meds ordered this encounter  Medications   DISCONTD: levETIRAcetam  (KEPPRA ) undiluted injection 1,500 mg   LORazepam  (ATIVAN ) injection 1 mg   dexamethasone  (DECADRON ) injection  10 mg   fosPHENYtoin  (CEREBYX ) 1,360 mg PE in sodium chloride  0.9 % 50 mL IVPB   levETIRAcetam  (KEPPRA ) undiluted injection 4,000 mg   potassium chloride  10 mEq in 100 mL IVPB   cefTRIAXone (ROCEPHIN) 1 g in sodium chloride  0.9 % 100 mL IVPB    Antibiotic Indication::   UTI   dexamethasone  (DECADRON ) injection 10 mg   Oral care mouth rinse   OR Linked Order Group    acetaminophen  (TYLENOL ) tablet 650 mg    acetaminophen  (TYLENOL ) suppository 650 mg   polyethylene glycol (MIRALAX / GLYCOLAX) packet 17 g   OR Linked Order Group    ondansetron  (ZOFRAN ) tablet 4 mg    ondansetron  (ZOFRAN ) injection 4 mg   LORazepam  (ATIVAN ) injection 4 mg   lacosamide  (VIMPAT ) 100 mg in sodium chloride  0.9 % 25 mL IVPB   levETIRAcetam  (KEPPRA ) undiluted injection 1,000 mg   insulin  aspart (novoLOG ) injection 0-9 Units    Correction coverage::   Sensitive (thin, NPO, renal)    CBG < 70::   Implement Hypoglycemia Standing Orders and refer to Hypoglycemia Standing Orders sidebar report    CBG 70 - 120::   0 units    CBG 121 - 150::   1 unit    CBG 151 - 200::   2 units    CBG 201 - 250::   3 units    CBG 251 - 300::    5 units    CBG 301 - 350::   7 units    CBG 351 - 400:   9 units    CBG > 400:   call MD and obtain STAT lab verification     Orders Placed This Encounter  Procedures   Critical Care   Resp panel by RT-PCR (RSV, Flu A&B, Covid) Anterior Nasal Swab   Urine Culture   CT Head Wo Contrast   DG Chest Portable 1 View   Comprehensive metabolic panel   CBC with Differential   Magnesium   Urinalysis, w/ Reflex to Culture (Infection Suspected) -Urine, Catheterized; In and out catheter   Comprehensive metabolic panel   CBC   Hemoglobin A1c   Diet regular Room service appropriate? Yes; Fluid consistency: Thin   Vital signs   Neuro checks   Cardiac monitoring   Activity as tolerated   Notify physician (specify)   Maintain IV access   Intake and output   Apply Seizure Disorder Care Plan   Do not place and if present remove PureWick   Initiate Oral Care Protocol   Initiate Carrier Fluid Protocol   Brush teeth with suction toothbrush 4 times daily   Apply Diabetes Mellitus Care Plan   STAT CBG when hypoglycemia is suspected. If treated, recheck every 15 minutes after each treatment until CBG >/= 70 mg/dl   Refer to Hypoglycemia Protocol Sidebar Report for treatment of CBG < 70 mg/dl   Full code   Consult to family practice   Consult to hospitalist   EKG 12-Lead   Place in observation (patient's expected length of stay will be less than 2 midnights)   Seizure precautions   Aspiration precautions   Fall precautions   Seizure precautions    Author: Mario LULLA Blanch, MD 12 pm- 8 pm. [facility]. 05/29/2024 2:01 PM Please note for any communication after hours contact [facility] Assigned provider on call on Amion.

## 2024-05-29 NOTE — ED Triage Notes (Signed)
 Patient presents to the ER via EMS from home with seizures. Patient has a hx of seizure and is on Keppra  at home. Patient normally awake, alert and orientated. Patient responsive to pain due to the 5mg  Versed  per EMS. Patient is a DNR.

## 2024-05-29 NOTE — Progress Notes (Signed)
   This pt is active with [facility] for primary diagnosis of glioblastoma. Our Nurse was in home with pt and daughter when pt was noted to have twitching in her face. There were two doses of ativan  1mg  given with no change after 35-40 minutes. Daughter was concerned that last time this happen it took a couple hours to get it to stop  seizing and felt she needed to go to the Hospital for IV meds to get them under control.   Pt is on dexamethasone  at home 2mg  daily, Keppra  1000mg  BID, Vimpact 100mg  BID, she does have PRN ativan  1mg  as well and morphine concentrate in home for sx management needs.   This is a related hospitalization. We will follow pt while in hospital and assist with d/c plan back home with hospice care.   Magdalena Berber RN [phone]

## 2024-05-30 DIAGNOSIS — G40909 Epilepsy, unspecified, not intractable, without status epilepticus: Secondary | ICD-10-CM | POA: Diagnosis not present

## 2024-05-30 DIAGNOSIS — E1142 Type 2 diabetes mellitus with diabetic polyneuropathy: Secondary | ICD-10-CM | POA: Diagnosis present

## 2024-05-30 DIAGNOSIS — G40109 Localization-related (focal) (partial) symptomatic epilepsy and epileptic syndromes with simple partial seizures, not intractable, without status epilepticus: Secondary | ICD-10-CM | POA: Diagnosis not present

## 2024-05-30 DIAGNOSIS — C712 Malignant neoplasm of temporal lobe: Secondary | ICD-10-CM | POA: Diagnosis present

## 2024-05-30 DIAGNOSIS — C713 Malignant neoplasm of parietal lobe: Secondary | ICD-10-CM | POA: Diagnosis not present

## 2024-05-30 DIAGNOSIS — Z515 Encounter for palliative care: Secondary | ICD-10-CM | POA: Diagnosis not present

## 2024-05-30 DIAGNOSIS — Z882 Allergy status to sulfonamides status: Secondary | ICD-10-CM | POA: Diagnosis not present

## 2024-05-30 DIAGNOSIS — Z881 Allergy status to other antibiotic agents status: Secondary | ICD-10-CM | POA: Diagnosis not present

## 2024-05-30 DIAGNOSIS — E1140 Type 2 diabetes mellitus with diabetic neuropathy, unspecified: Secondary | ICD-10-CM | POA: Diagnosis present

## 2024-05-30 DIAGNOSIS — G4733 Obstructive sleep apnea (adult) (pediatric): Secondary | ICD-10-CM | POA: Diagnosis present

## 2024-05-30 DIAGNOSIS — G40101 Localization-related (focal) (partial) symptomatic epilepsy and epileptic syndromes with simple partial seizures, not intractable, with status epilepticus: Secondary | ICD-10-CM | POA: Diagnosis present

## 2024-05-30 DIAGNOSIS — Z79890 Hormone replacement therapy (postmenopausal): Secondary | ICD-10-CM | POA: Diagnosis not present

## 2024-05-30 DIAGNOSIS — Z87891 Personal history of nicotine dependence: Secondary | ICD-10-CM | POA: Diagnosis not present

## 2024-05-30 DIAGNOSIS — Z7984 Long term (current) use of oral hypoglycemic drugs: Secondary | ICD-10-CM | POA: Diagnosis not present

## 2024-05-30 DIAGNOSIS — R569 Unspecified convulsions: Secondary | ICD-10-CM | POA: Diagnosis not present

## 2024-05-30 DIAGNOSIS — N390 Urinary tract infection, site not specified: Secondary | ICD-10-CM | POA: Diagnosis present

## 2024-05-30 DIAGNOSIS — Z23 Encounter for immunization: Secondary | ICD-10-CM | POA: Diagnosis not present

## 2024-05-30 DIAGNOSIS — E785 Hyperlipidemia, unspecified: Secondary | ICD-10-CM | POA: Diagnosis present

## 2024-05-30 DIAGNOSIS — I119 Hypertensive heart disease without heart failure: Secondary | ICD-10-CM | POA: Diagnosis present

## 2024-05-30 DIAGNOSIS — Z85820 Personal history of malignant melanoma of skin: Secondary | ICD-10-CM | POA: Diagnosis not present

## 2024-05-30 DIAGNOSIS — G40901 Epilepsy, unspecified, not intractable, with status epilepticus: Secondary | ICD-10-CM | POA: Diagnosis present

## 2024-05-30 DIAGNOSIS — Z66 Do not resuscitate: Secondary | ICD-10-CM | POA: Diagnosis present

## 2024-05-30 DIAGNOSIS — Z79899 Other long term (current) drug therapy: Secondary | ICD-10-CM | POA: Diagnosis not present

## 2024-05-30 DIAGNOSIS — E039 Hypothyroidism, unspecified: Secondary | ICD-10-CM | POA: Diagnosis present

## 2024-05-30 DIAGNOSIS — F419 Anxiety disorder, unspecified: Secondary | ICD-10-CM | POA: Diagnosis present

## 2024-05-30 DIAGNOSIS — Z833 Family history of diabetes mellitus: Secondary | ICD-10-CM | POA: Diagnosis not present

## 2024-05-30 DIAGNOSIS — H9193 Unspecified hearing loss, bilateral: Secondary | ICD-10-CM | POA: Diagnosis present

## 2024-05-30 DIAGNOSIS — Z8249 Family history of ischemic heart disease and other diseases of the circulatory system: Secondary | ICD-10-CM | POA: Diagnosis not present

## 2024-05-30 DIAGNOSIS — Z1152 Encounter for screening for COVID-19: Secondary | ICD-10-CM | POA: Diagnosis not present

## 2024-05-30 LAB — CBC
HCT: 39.6 % (ref 36.0–46.0)
HCT: 38.6 % (ref 36.0–46.0)
Hemoglobin: 13.1 g/dL (ref 12.0–15.0)
Hemoglobin: 12.7 g/dL (ref 12.0–15.0)
MCH: 25.3 pg — ABNORMAL LOW (ref 26.0–34.0)
MCH: 25.7 pg — ABNORMAL LOW (ref 26.0–34.0)
MCHC: 33.1 g/dL (ref 30.0–36.0)
MCHC: 32.9 g/dL (ref 30.0–36.0)
MCV: 76.4 fL — ABNORMAL LOW (ref 80.0–100.0)
MCV: 78 fL — ABNORMAL LOW (ref 80.0–100.0)
Platelets: 151 K/uL (ref 150–400)
Platelets: 158 K/uL (ref 150–400)
RBC: 5.18 MIL/uL — ABNORMAL HIGH (ref 3.87–5.11)
RBC: 4.95 MIL/uL (ref 3.87–5.11)
RDW: 14.3 % (ref 11.5–15.5)
RDW: 14.5 % (ref 11.5–15.5)
WBC: 11.1 K/uL — ABNORMAL HIGH (ref 4.0–10.5)
WBC: 13.1 K/uL — ABNORMAL HIGH (ref 4.0–10.5)
nRBC: 0 % (ref 0.0–0.2)
nRBC: 0 % (ref 0.0–0.2)

## 2024-05-30 LAB — COMPREHENSIVE METABOLIC PANEL WITH GFR
ALT: 17 U/L (ref 0–44)
ALT: 18 U/L (ref 0–44)
AST: 11 U/L — ABNORMAL LOW (ref 15–41)
AST: 19 U/L (ref 15–41)
Albumin: 3.1 g/dL — ABNORMAL LOW (ref 3.5–5.0)
Albumin: 3.2 g/dL — ABNORMAL LOW (ref 3.5–5.0)
Alkaline Phosphatase: 50 U/L (ref 38–126)
Alkaline Phosphatase: 52 U/L (ref 38–126)
Anion gap: 12 (ref 5–15)
Anion gap: 14 (ref 5–15)
BUN: 13 mg/dL (ref 8–23)
BUN: 14 mg/dL (ref 8–23)
CO2: 22 mmol/L (ref 22–32)
CO2: 16 mmol/L — ABNORMAL LOW (ref 22–32)
Calcium: 8.3 mg/dL — ABNORMAL LOW (ref 8.9–10.3)
Calcium: 8.3 mg/dL — ABNORMAL LOW (ref 8.9–10.3)
Chloride: 105 mmol/L (ref 98–111)
Chloride: 106 mmol/L (ref 98–111)
Creatinine, Ser: 0.52 mg/dL (ref 0.44–1.00)
Creatinine, Ser: 0.59 mg/dL (ref 0.44–1.00)
GFR, Estimated: >60 mL/min (ref >60)
GFR, Estimated: >60 mL/min (ref >60)
Glucose, Bld: 109 mg/dL — ABNORMAL HIGH (ref 70–99)
Glucose, Bld: 109 mg/dL — ABNORMAL HIGH (ref 70–99)
Potassium: 3.5 mmol/L (ref 3.5–5.1)
Potassium: 4.1 mmol/L (ref 3.5–5.1)
Sodium: 139 mmol/L (ref 135–145)
Sodium: 136 mmol/L (ref 135–145)
Total Bilirubin: 0.6 mg/dL (ref 0.0–1.2)
Total Bilirubin: 0.9 mg/dL (ref 0.0–1.2)
Total Protein: 5.7 g/dL — ABNORMAL LOW (ref 6.5–8.1)
Total Protein: 5.9 g/dL — ABNORMAL LOW (ref 6.5–8.1)

## 2024-05-30 LAB — GLUCOSE, CAPILLARY
Glucose-Capillary: 126 mg/dL — ABNORMAL HIGH (ref 70–99)
Glucose-Capillary: 98 mg/dL (ref 70–99)
Glucose-Capillary: 119 mg/dL — ABNORMAL HIGH (ref 70–99)
Glucose-Capillary: 107 mg/dL — ABNORMAL HIGH (ref 70–99)

## 2024-05-30 MED ORDER — SODIUM CHLORIDE 0.9 % IV SOLN
1.0000 g | INTRAVENOUS | Status: DC
  Administered 2024-05-30: 1 g via INTRAVENOUS
  Filled 2024-05-30: qty 10

## 2024-05-30 MED ORDER — GLUCERNA SHAKE PO LIQD
237.0000 mL | Freq: Three times a day (TID) | ORAL | Status: DC
  Administered 2024-05-31 – 2024-06-01 (×4): 237 mL via ORAL

## 2024-05-30 MED ORDER — VALPROATE SODIUM 100 MG/ML IV SOLN
10.0000 mg/kg/d | Freq: Two times a day (BID) | INTRAVENOUS | Status: DC
Start: 2024-05-30 — End: 2024-05-31
  Administered 2024-05-30 – 2024-05-31 (×2): 340 mg via INTRAVENOUS
  Filled 2024-05-30 (×3): qty 3.4

## 2024-05-30 NOTE — Plan of Care (Signed)
  Problem: Coping: Goal: Ability to adjust to condition or change in health will improve Outcome: Not Progressing   Problem: Metabolic: Goal: Ability to maintain appropriate glucose levels will improve Outcome: Not Progressing   Problem: Nutritional: Goal: Maintenance of adequate nutrition will improve Outcome: Not Progressing Goal: Progress toward achieving an optimal weight will improve Outcome: Not Progressing   Problem: Tissue Perfusion: Goal: Adequacy of tissue perfusion will improve Outcome: Not Progressing   Problem: Elimination: Goal: Will not experience complications related to urinary retention Outcome: Not Progressing

## 2024-05-30 NOTE — Care Management Obs Status (Signed)
 MEDICARE OBSERVATION STATUS NOTIFICATION   Patient Details  Name: Yvonne [name] MRN: [id number] Date of Birth: 12/14/1945   Medicare Observation Status Notification Given:  Yes    Angelito Hopping G., RN 05/30/2024, 9:05 AM

## 2024-05-30 NOTE — Plan of Care (Signed)
 Family at bedside. Swallow eval per speech ordered. Spouse became ill in room.    Problem: Education: Goal: Ability to describe self-care measures that may prevent or decrease complications (Diabetes Survival Skills Education) will improve Outcome: Progressing   Problem: Nutritional: Goal: Maintenance of adequate nutrition will improve Outcome: Progressing   Problem: Skin Integrity: Goal: Risk for impaired skin integrity will decrease Outcome: Progressing   Problem: Skin Integrity: Goal: Risk for impaired skin integrity will decrease Outcome: Progressing

## 2024-05-30 NOTE — Progress Notes (Addendum)
 Progress Note   Patient: Yvonne [name] FMW:[id number] DOB: 1946/03/06 DOA: 05/29/2024     0 DOS: the patient was seen and examined on 05/30/2024    Brief hospital course: Yvonne [name] is a 78 y.o. female with PMH stage IV glioblastoma temporal lobe, diabetes, Graves' disease, diabetic neuropathy, OSA.  Presented to [facility] 10/17 with status epilepticus, aborted with fosphenytoin  20 mg/kg load. Patient was recently admitted on 24 September and discharged on 28 September with same presentation.   Assessment and Plan:  Seizure  Stage IV glioblastoma multiforme: Patient still in confused postictal state.  Known progression with increase in size of lesion.  Neurology consulted, recommend maximizing Keppra  and Vimpat , starting Dilantin , and increasing home Decadron . - SLP for swallow eval - Aspiration, seizure, and fall precautions - Neurochecks every 4 hours - Keppra  1500 mg IV every 12 hours starting 10/18 - Vimpat  200 mg IV every 12 hours starting 10/18 - Phenytoin  IV 150 mg twice daily, new med starting 10/18 AM - Decadron  IV 4 mg every 12 starting 10/18  Addendum 10/18 @2 :28pm: Per neurology Dr. Sallyann, daughter did not want phenytoin  so neurology ordered valproate instead, 340 mg IV every 12, new meds starting 10/18 AM.  UTI Urine cultured not yet resulted, will follow culture sensitivity.  -Continue Rocephin until able to narrow and convert to p.o.  Hypothyroidism: Continue Synthroid .   Essential hypertension: Stable. Continue patient's metoprolol .   Diabetes mellitus type 2: Stable. Continue sliding scale glycemic protocol.  After SLP swallow test, plan for diabetic diet.    Subjective:  Found Yvonne [name] laying in bed with daughter at bedside.  Patient preoccupied with blankets on bed, removing them from her legs and then covering herself back up again.  Has been saying she is cold.  Daughter at bedside reports we're a little delirious and states her mother has not yet  fully come out of her postictal state.  Patient pleasant and cooperative with physical exam.  Will smile and nod during conversation.  Physical Exam: BP (!) 149/71 (BP Location: Right Arm)   Pulse 64   Temp 98.4 F (36.9 C)   Resp 16   Ht 5' 8 (1.727 m)   Wt 68 kg   SpO2 96%   BMI 22.81 kg/m    General appearance: Alert and in no distress.  Responds appropriately to questions.  Eye contact, dress and hygiene appropriate. HEENT: Anicteric, conjunctivae and sclerae normal without injection or icterus, lids and lashes normal.   Cor: RRR, without murmurs, rubs.  No LE edema Resp:  Normal respiratory rate and rhythm.  CTAB without rales or wheezes. Abd:  No TTP or rebound all quadrants.  No masses or organomegaly.  MSK: Symmetrical without gross deformities of the hands, large joints, or legs. Skin:  Skin intact without significant rashes or lesions. Neuro: Speech is dysphasic and intermittently difficult to understand. Psych: Limited attention span, attention drifts.  Alert but unable to state her name, year, location, or president.  Data Reviewed: Urine culture-not yet resulted Respiratory panel negative CXR unremarkable CT head without contrast showing increase in lesion size with chronic encephalomalacia CBC with WBC 11.1  Recent Labs  Lab 05/29/24 1028 05/30/24 0539  NA 136 139  K 3.2* 3.5  CO2 25 22  BUN 13 13  CREATININE 0.69 0.52  MG 1.7  --   WBC 8.0 11.1*  HGB 12.5 13.1  PLT 158 151   Family Communication: Daughter Yvonne [name] at bedside  Disposition: Status is:  Observation The patient remains OBS appropriate and will d/c before 2 midnights.  Author: Betsey CHRISTELLA Helling, MD 05/30/2024 11:26 AM  For on call review [email or web address].

## 2024-05-30 NOTE — Progress Notes (Signed)
 SLP Cancellation Note  Patient Details Name: Yvonne [name] MRN: [id number] DOB: 1945-08-30   Cancelled treatment:        SLP attempted to see this pt for a bedside swallow exam, her family refused as she has had a bad day and needs to rest and asked to f/u in the morning.   Manuelita Blew M.S. CCC-SLP

## 2024-05-30 NOTE — Plan of Care (Signed)
  Problem: Coping: Goal: Ability to adjust to condition or change in health will improve Outcome: Progressing   Problem: Fluid Volume: Goal: Ability to maintain a balanced intake and output will improve Outcome: Progressing   Problem: Metabolic: Goal: Ability to maintain appropriate glucose levels will improve Outcome: Progressing   Problem: Skin Integrity: Goal: Risk for impaired skin integrity will decrease Outcome: Progressing   Problem: Tissue Perfusion: Goal: Adequacy of tissue perfusion will improve Outcome: Progressing   Problem: Education: Goal: Knowledge of General Education information will improve Description: Including pain rating scale, medication(s)/side effects and non-pharmacologic comfort measures Outcome: Progressing   Problem: Clinical Measurements: Goal: Ability to maintain clinical measurements within normal limits will improve Outcome: Progressing Goal: Will remain free from infection Outcome: Progressing Goal: Diagnostic test results will improve Outcome: Progressing Goal: Respiratory complications will improve Outcome: Progressing Goal: Cardiovascular complication will be avoided Outcome: Progressing   Problem: Elimination: Goal: Will not experience complications related to bowel motility Outcome: Progressing Goal: Will not experience complications related to urinary retention Outcome: Progressing   Problem: Pain Managment: Goal: General experience of comfort will improve and/or be controlled Outcome: Progressing   Problem: Safety: Goal: Ability to remain free from injury will improve Outcome: Progressing   Problem: Skin Integrity: Goal: Risk for impaired skin integrity will decrease Outcome: Progressing   Problem: Education: Goal: Expressions of having a comfortable level of knowledge regarding the disease process will increase Outcome: Progressing   Problem: Coping: Goal: Ability to identify appropriate support needs will  improve Outcome: Progressing   Problem: Health Behavior/Discharge Planning: Goal: Compliance with prescribed medication regimen will improve Outcome: Progressing   Problem: Medication: Goal: Risk for medication side effects will decrease Outcome: Progressing   Problem: Clinical Measurements: Goal: Diagnostic test results will improve Outcome: Progressing   Problem: Self-Concept: Goal: Level of anxiety will decrease Outcome: Progressing Goal: Ability to verbalize feelings about condition will improve Outcome: Progressing

## 2024-05-30 NOTE — Progress Notes (Signed)
 NEUROLOGY CONSULT FOLLOW UP NOTE   Date of service: May 30, 2024 Patient Name: Yvonne [name] MRN:  [id number] DOB:  10-Jul-1946  Interval Hx/subjective   No further seizures. Very drowsy in the AM.  As per daughter Yvonne [name]), patient was lucid prior to presentation.   Vitals   Vitals:   05/30/24 0334 05/30/24 0756 05/30/24 1545 05/30/24 2028  BP: (!) 153/67 (!) 149/71 (!) 160/73 (!) 148/63  Pulse: 61 64 66 60  Resp: 18 16 16 17   Temp: 97.7 F (36.5 C) 98.4 F (36.9 C) 98.5 F (36.9 C) 97.8 F (36.6 C)  TempSrc: Axillary  Oral Oral  SpO2: 96% 96% 95% 97%  Weight:      Height:         Body mass index is 22.81 kg/m.  Physical Exam   Constitutional: Appears chronically ill. Eyes: No scleral injection.  HENT: No OP obstrucion.  Head: Normocephalic.  Cardiovascular: Warm and well perfused. Respiratory: Effort normal, non-labored breathing.  GI: Soft.  No distension. There is no tenderness.  Skin: WDI.   Neurologic Examination   Mental status: alert. Speech: expressive aphasia. Cranial nerves: PERRL EOMI Corneals intact. Motor: Normal bulk and tone. All 4 limbs at least anti-gravity. Sensory: Deferred Reflexes: Deferred Coordination: Deferred Gait: deferred   Medications  Current Facility-Administered Medications:    0.9 %  sodium chloride  infusion, , Intravenous, Continuous, Tobie Mario GAILS, MD, Last Rate: 75 mL/hr at 05/30/24 1004, New Bag at 05/30/24 1004   acetaminophen  (TYLENOL ) tablet 650 mg, 650 mg, Oral, Q4H PRN **OR** acetaminophen  (TYLENOL ) suppository 650 mg, 650 mg, Rectal, Q4H PRN, Tobie Mario GAILS, MD   Chlorhexidine  Gluconate Cloth 2 % PADS 6 each, 6 each, Topical, Daily, Tobie Mario GAILS, MD, 6 each at 05/30/24 1000   dexamethasone  (DECADRON ) injection 4 mg, 4 mg, Intravenous, Q12H, Vicki Pasqual M, MD, 4 mg at 05/30/24 1004   feeding supplement (GLUCERNA SHAKE) (GLUCERNA SHAKE) liquid 237 mL, 237 mL, Oral, TID BM, Macario Dorothyann CHRISTELLA, MD    insulin  aspart (novoLOG ) injection 0-9 Units, 0-9 Units, Subcutaneous, Q4H, Tobie Mario GAILS, MD, 1 Units at 05/30/24 9662   lacosamide  (VIMPAT ) 200 mg in sodium chloride  0.9 % 25 mL IVPB, 200 mg, Intravenous, Q12H, Yezenia Fredrick M, MD, Last Rate: 90 mL/hr at 05/30/24 2021, 200 mg at 05/30/24 2021   levETIRAcetam  (KEPPRA ) undiluted injection 1,500 mg, 1,500 mg, Intravenous, Q12H, Carston Riedl M, MD, 1,500 mg at 05/30/24 1004   LORazepam  (ATIVAN ) injection 4 mg, 4 mg, Intravenous, Q5 Min x 2 PRN, Tobie Mario GAILS, MD   ondansetron  (ZOFRAN ) tablet 4 mg, 4 mg, Oral, Q6H PRN **OR** ondansetron  (ZOFRAN ) injection 4 mg, 4 mg, Intravenous, Q6H PRN, Tobie Mario GAILS, MD   Oral care mouth rinse, 15 mL, Mouth Rinse, PRN, Tobie, Mario GAILS, MD   polyethylene glycol (MIRALAX / GLYCOLAX) packet 17 g, 17 g, Oral, Daily PRN, Patel, Ekta V, MD   valproate (DEPACON) 340 mg in dextrose  5 % 50 mL IVPB, 10 mg/kg/day, Intravenous, Q12H, Audra Kagel M, MD, Last Rate: 53.4 mL/hr at 05/30/24 1617, 340 mg at 05/30/24 1617  Facility-Administered Medications Ordered in Other Encounters:    influenza vaccine adjuvanted (FLUAD) injection 0.5 mL, 0.5 mL, Intramuscular, Once, Buckley Arthea POUR, MD  Labs and Diagnostic Imaging   CBC:  Recent Labs  Lab 05/29/24 1028 05/30/24 0539 05/30/24 1427  WBC 8.0 11.1* 13.1*  NEUTROABS 6.2  --   --   HGB 12.5 13.1 12.7  HCT 38.2 39.6 38.6  MCV 77.3* 76.4* 78.0*  PLT 158 151 158    Basic Metabolic Panel:  Lab Results  Component Value Date   NA 136 05/30/2024   K 4.1 05/30/2024   CO2 16 (L) 05/30/2024   GLUCOSE 109 (H) 05/30/2024   BUN 14 05/30/2024   CREATININE 0.59 05/30/2024   CALCIUM  8.3 (L) 05/30/2024   GFRNONAA >60 05/30/2024   GFRAA >60 10/23/2019   Lipid Panel:  Lab Results  Component Value Date   LDLCALC 66 04/05/2022   HgbA1c:  Lab Results  Component Value Date   HGBA1C 7.7 (H) 05/29/2024   Urine Drug Screen:     Component Value Date/Time   LABOPIA NONE DETECTED 05/07/2024  0809   COCAINSCRNUR NONE DETECTED 05/07/2024 0809   LABBENZ NONE DETECTED 05/07/2024 0809   AMPHETMU NONE DETECTED 05/07/2024 0809   THCU NONE DETECTED 05/07/2024 0809   LABBARB NONE DETECTED 05/07/2024 0809    Alcohol Level     Component Value Date/Time   ETH <15 05/06/2024 0923   INR  Lab Results  Component Value Date   INR 0.9 05/06/2024   APTT  Lab Results  Component Value Date   APTT 38 (H) 05/06/2024   AED levels:  Lab Results  Component Value Date   PHENYTOIN  16.8 05/29/2024   LEVETIRACETA <2.0 (L) 03/06/2024    CT Head without contrast(Personally reviewed): L parietal lesion increased in size from 3.9 x 3.4cm -> 4.3 x 3.9cm.   Assessment   Yvonne [name] is a 78 y.o. female with hx of GBM (L parietal lobe) c/b progression & seizures, currently in hospice, who presents with breakthrough seizures in focal status epilepticus (right face and arm jerking). CTH with progression and likely trigger for breakthrough. S/p fosphenytoin  load in ED. After discussion with daughter Yvonne who is her POA, loaded with VPA and started on maintenance instead to avoid over-sedation.  Recommendations  - VPA load and maintenance of 10mg /kg/day  - Continue maxed Keppra  1500mg  BID - Continue maxed Vimpat  200mg  BID ______________________________________________________________________   Signed, Haileigh Pitz M Persephanie Laatsch, MD [facility]

## 2024-05-31 DIAGNOSIS — R569 Unspecified convulsions: Secondary | ICD-10-CM

## 2024-05-31 LAB — GLUCOSE, CAPILLARY
Glucose-Capillary: 119 mg/dL — ABNORMAL HIGH (ref 70–99)
Glucose-Capillary: 94 mg/dL (ref 70–99)
Glucose-Capillary: 284 mg/dL — ABNORMAL HIGH (ref 70–99)
Glucose-Capillary: 246 mg/dL — ABNORMAL HIGH (ref 70–99)
Glucose-Capillary: 179 mg/dL — ABNORMAL HIGH (ref 70–99)

## 2024-05-31 LAB — URINE CULTURE: Culture: >=100000 — AB

## 2024-05-31 MED ORDER — VALPROATE SODIUM 100 MG/ML IV SOLN
250.0000 mg | Freq: Three times a day (TID) | INTRAVENOUS | Status: DC
Start: 2024-05-31 — End: 2024-06-01
  Administered 2024-05-31 (×3): 250 mg via INTRAVENOUS
  Filled 2024-05-31 (×4): qty 2.5

## 2024-05-31 NOTE — Plan of Care (Signed)
 Ambulating with therapy in halls today.    Problem: Education: Goal: Ability to describe self-care measures that may prevent or decrease complications (Diabetes Survival Skills Education) will improve Outcome: Progressing   Problem: Skin Integrity: Goal: Risk for impaired skin integrity will decrease Outcome: Progressing   Problem: Activity: Goal: Risk for activity intolerance will decrease Outcome: Progressing   Problem: Nutrition: Goal: Adequate nutrition will be maintained Outcome: Progressing

## 2024-05-31 NOTE — Evaluation (Signed)
 Occupational Therapy Evaluation Patient Details Name: Yvonne [name] MRN: [id number] DOB: 1945/08/17 Today's Date: 05/31/2024   History of Present Illness   Pt is a 78 y.o. female presenting 10/17 from home with breakthrough seizures. CTH shows increased size of glioblastoma.   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     Clinical Impressions Upon eval, pt with garbled speech, but close family friends/care takers report that this is her baseline. History taken from members in room. Pt typically ambulatory with SBA, receives assist from aide with bathing 3x/week. Upon eval, pt with R inattention friends report is not her baseline; needing mod-max cues for attention to R side of body and environment at times. Pt currently min-max A for BADL and needing min A +2 for ambulation with RW with pt observed to push RW out in front of her away from body with difficulty following commands to stay within RW. Pt has 24/7 assist at home between aides and family friends, but could benefit from more hours with aide to reduce caregiver burden in light of pt with decline in functional status. Will continue to follow to to optimize pt independence and reduce caregiver burden, but no OT follow up recommended after discharge as pt on hospice.      If plan is discharge home, recommend the following:   A lot of help with walking and/or transfers;A lot of help with bathing/dressing/bathroom;Assistance with cooking/housework;Assistance with feeding;Direct supervision/assist for medications management;Direct supervision/assist for  financial management;Assist for transportation;Help with stairs or ramp for entrance;Supervision due to cognitive status     Functional Status Assessment   Patient has had a recent decline in their functional status and demonstrates the ability to make significant improvements in function in a reasonable and predictable amount of time.     Equipment Recommendations   None recommended by OT     Recommendations for Other Services         Precautions/Restrictions   Precautions Precautions: Fall Recall of Precautions/Restrictions: Impaired Restrictions Weight Bearing Restrictions Per Provider Order: No     Mobility Bed Mobility Overal bed mobility: Needs Assistance Bed Mobility: Rolling, Sidelying to Sit, Sit to Supine Rolling: Mod assist Sidelying to sit: Mod assist, +2 for physical assistance, +2 for safety/equipment   Sit to supine: Min assist   General bed mobility comments: pt initiating rolling to the right, assist to  bring LE's off EOB and to raise trunk. Assist to scoot hips forward using bed pad. Able to return to supine with slight assist for LE's    Transfers Overall transfer level: Needs assistance Equipment used: None Transfers: Sit to/from Stand Sit to Stand: Contact guard assist           General transfer comment: Impulsive with pt quickly rising without RW close by. CGA for safety      Balance Overall balance assessment: Needs assistance Sitting-balance support: No upper extremity supported, Feet supported Sitting balance-Leahy Scale: Poor Sitting balance - Comments: slight R lateral lean with MinA to CGA Postural control: Right lateral lean Standing balance support: Bilateral upper extremity supported, During functional activity Standing balance-Leahy Scale: Poor Standing balance comment: reliant on UE support with R lateral lean  ADL either performed or assessed with clinical judgement   ADL  Overall ADL's : Needs assistance/impaired Eating/Feeding: Sitting;Bed level;Minimal assistance   Grooming: Oral care;Minimal assistance;Moderate assistance;Standing               Lower Body Dressing: Maximal assistance;Sitting/lateral leans   Toilet Transfer: Minimal assistance;Ambulation;Rolling walker (2 wheels)           Functional mobility during ADLs: Minimal assistance;Rolling walker (2 wheels) General ADL Comments: min A (+2 with fatigue) with RW; mod A +2 HHA     Vision   Vision Assessment?: Vision impaired- to be further tested in functional context Additional Comments: decreased visual attention to R side of environment needing cues to locate grooming items on R as well as people. able to do with increased time. Difficulty following command for formal visual fields assessment     Perception         Praxis         Pertinent Vitals/Pain Pain Assessment Pain Assessment: No/denies pain     Extremity/Trunk Assessment Upper Extremity Assessment Upper Extremity Assessment: RUE deficits/detail RUE Deficits / Details: inattention to R side of body, generally weak grip, undershoots with reaching tasks when she will attend to task on R   Lower Extremity Assessment Lower Extremity Assessment: Defer to PT evaluation RLE Deficits / Details: decreased hip flexion/knee flexion noted in supine when compared to left. Unable to formally assess due to cognition   Cervical / Trunk Assessment Cervical / Trunk Assessment: Kyphotic   Communication Communication Communication: Impaired Factors Affecting Communication: Reduced clarity of speech;Difficulty expressing self   Cognition Arousal: Alert Behavior During Therapy: Impulsive, Lability Cognition: Cognition impaired, Difficult to assess Difficult to assess due to: Impaired communication   Awareness: Intellectual awareness impaired, Online awareness impaired   Attention impairment (select first level of impairment):  Sustained attention Executive functioning impairment (select all impairments): Initiation, Sequencing, Problem solving OT - Cognition Comments: pt with decr attention to R side of body. Needs cues for problem solving intermittently. Follows one step commands with increased time                 Following commands: Impaired Following commands impaired: Follows one step commands inconsistently, Follows one step commands with increased time     Cueing  General Comments   Cueing Techniques: Verbal cues;Tactile cues;Gestural cues;Visual cues  educated pt family friends Donny and Rosina RE: use of gait belt, bringing attention to R side   Exercises     Shoulder Instructions      Home Living Family/patient expects to be discharged to:: Private residence Living Arrangements: Children (daughter) Available Help at Discharge: Family;Friend(s);Available 24 hours/day;Personal care attendant Type of Home: House Home Access: Stairs to enter [employer] of Steps: 4 Entrance Stairs-Rails: Right;Left       Bathroom Shower/Tub: Chief Strategy Officer: Standard     Home Equipment: Hospital bed;Rolling Walker (2 wheels);Tub bench;Wheelchair - manual;BSC/3in1          Prior Functioning/Environment Prior Level of Function : Needs assist             Mobility Comments: close supervision with RW ADLs Comments: bathing 2-3 times per week, hospice aide 3 times/week for one hour.  private pay for PCA 3 days/week assist with feeding, ambulation, home making, sitter    OT Problem List: Decreased strength;Decreased range of motion;Decreased activity tolerance;Decreased coordination;Decreased safety awareness;Decreased knowledge of use of DME or AE;Pain;Decreased knowledge of precautions;Impaired sensation;Impaired tone;Impaired UE functional use;Decreased  cognition;Impaired vision/perception;Impaired balance (sitting and/or standing)   OT Treatment/Interventions:  Self-care/ADL training;Therapeutic exercise;DME and/or AE instruction;Therapeutic activities;Cognitive remediation/compensation;Patient/family education;Visual/perceptual remediation/compensation;Balance training;Splinting;Neuromuscular education;Energy conservation      OT Goals(Current goals can be found in the care plan section)   Acute Rehab OT Goals Patient Stated Goal: unable OT Goal Formulation: Patient unable to participate in goal setting Time For Goal Achievement: 06/14/24 Potential to Achieve Goals: Fair ADL Goals Pt Will Perform Grooming: with contact guard assist;standing Pt Will Transfer to Toilet: with contact guard assist;ambulating Pt/caregiver will Perform Home Exercise Program: Right Upper extremity;Increased strength Additional ADL Goal #1: pt will locate 4/5 items on R side of environment min cues.   OT Frequency:  Min 2X/week    Co-evaluation PT/OT/SLP Co-Evaluation/Treatment: Yes Reason for Co-Treatment: Necessary to address cognition/behavior during functional activity;For patient/therapist safety;To address functional/ADL transfers PT goals addressed during session: Mobility/safety with mobility;Proper use of DME;Balance OT goals addressed during session: ADL's and self-care      AM-PAC OT 6 Clicks Daily Activity     Outcome Measure Help from another person eating meals?: A Little Help from another person taking care of personal grooming?: A Lot Help from another person toileting, which includes using toliet, bedpan, or urinal?: A Lot Help from another person bathing (including washing, rinsing, drying)?: A Lot Help from another person to put on and taking off regular upper body clothing?: A Lot Help from another person to put on and taking off regular lower body clothing?: A Lot 6 Click Score: 13   End of Session Equipment Utilized During Treatment: Gait belt;Rolling walker (2 wheels) Nurse Communication: Mobility status  Activity Tolerance: Patient  tolerated treatment well Patient left: in bed;with call bell/phone within reach;with bed alarm set;with family/visitor present  OT Visit Diagnosis: Other abnormalities of gait and mobility (R26.89);Muscle weakness (generalized) (M62.81);Other symptoms and signs involving the nervous system (R29.898);Other symptoms and signs involving cognitive function;Cognitive communication deficit (R41.841) Symptoms and signs involving cognitive functions: Other cerebrovascular disease                Time: 1430-1501 OT Time Calculation (min): 31 min Charges:  OT General Charges $OT Visit: 1 Visit OT Evaluation $OT Eval Moderate Complexity: 1 Mod  Elma JONETTA Lebron FREDERICK, OTR/L [facility] Acute Rehabilitation Office: [phone]   Elma JONETTA Lebron 05/31/2024, 4:54 PM

## 2024-05-31 NOTE — Evaluation (Addendum)
 Physical Therapy Evaluation Patient Details Name: Yvonne [name] MRN: [id number] DOB: 12/31/1945 Today's Date: 05/31/2024  History of Present Illness  Pt is a 78 y.o. female presenting 10/17 from home with breakthrough seizures. CTH shows increased size of glioblastoma.   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   Clinical Impression  Home set-up and history taken from family as pt was pleasantly confused during eval. Family reports pt is at cognitive baseline with pt able to intermittently follow commands and occasionally communicate with short phrases. Pt had decreased attention to her R side with possible R sided weakness, though difficult to assess due to cognition. In today's session, pt was able to stand with no AD and CGA for safety. Upon initial ambulation, pt required MinA with RW. Assist needed to manage RW as pt tended to drift to the right and push RW anterior to self. Unable to follow corrections for gait pattern or for safety with RW. Attempted gait with [facility] with pt requiring ModAx2 due to R lateral lean and instability. Discussed recommendation for gait belt and for safety when ambulating with a RW. Family reports having 24/7 assist at home between family, friends, and aides. Will continue to follow acutely for caregiver education and to decrease burden of care. Recommending return home when medically stable with current level of support.         If plan is discharge home, recommend the following: Direct supervision/assist for medications management;Direct supervision/assist for financial  management;Assist for transportation;Help with stairs or ramp for entrance;Assistance with cooking/housework;Supervision due to cognitive status;A lot of help with walking and/or transfers;A lot of help with bathing/dressing/bathroom   Can travel by private vehicle    Yes    Equipment Recommendations None recommended by PT (owns equipment)     Functional Status Assessment Patient has had a recent decline in their functional status and demonstrates the ability to make significant improvements in function in a reasonable and predictable amount of time.     Precautions / Restrictions Precautions Precautions: Fall Recall of Precautions/Restrictions: Impaired Restrictions Weight Bearing Restrictions Per Provider Order: No      Mobility  Bed Mobility Overal bed mobility: Needs Assistance Bed Mobility: Rolling, Sidelying to Sit, Sit to Supine Rolling: Mod assist Sidelying to sit: Mod assist, +2 for physical assistance, +2 for safety/equipment   Sit to supine: Min assist   General bed mobility comments: pt initiating rolling to the right, assist to  bring LE's off EOB and to raise trunk. Assist to scoot hips forward using bed pad. Able to return to supine with slight assist for LE's    Transfers Overall transfer level: Needs assistance Equipment used: None Transfers: Sit to/from Stand Sit to Stand: Contact guard assist    General transfer comment: Impulsive with pt quickly rising without RW close by. CGA for safety    Ambulation/Gait Ambulation/Gait assistance: Min assist, +2 safety/equipment, Mod assist, +2 physical assistance Gait Distance (Feet): 250 Feet Assistive device: Rolling walker (2 wheels), 2 person hand held assist Gait Pattern/deviations: Step-through pattern, Decreased stride length, Trunk flexed, Drifts right/left   Gait velocity interpretation: 1.31 - 2.62 ft/sec, indicative of limited community ambulator   General Gait Details:  Forward flexed posture with pt  pushing RW anterior to self. Quick gait speed with assist needed to control RW and for RW proximity. Tendency to drift to the right. Attempted ambulating with no AD with pt needing 2HH to maintain balance due to R lateral lean    Balance Overall balance assessment: Needs assistance Sitting-balance support: No upper extremity supported, Feet supported Sitting balance-Leahy Scale: Poor Sitting balance - Comments: slight R lateral lean with MinA to CGA Postural control: Right lateral lean Standing balance support: Bilateral upper extremity supported, During functional activity Standing balance-Leahy Scale: Poor Standing balance comment: reliant on UE support with R lateral lean       Pertinent Vitals/Pain Pain Assessment Pain Assessment: No/denies pain    Home Living Family/patient expects to be discharged to:: Private residence Living Arrangements: Children (daughter) Available Help at Discharge: Family;Friend(s);Available 24 hours/day;Personal care attendant Type of Home: House Home Access: Stairs to enter Entrance Stairs-Rails: Right;Left Entrance Stairs-Number of Steps: 4     Home Equipment: Hospital bed;Rolling Walker (2 wheels);Tub bench;Wheelchair - manual;BSC/3in1      Prior Function Prior Level of Function : Needs assist  Mobility Comments: close supervision with RW ADLs Comments: bathing 2-3 times per week, hospice aide 3 times/week for one hour.  private pay for PCA 3 days/week assist with feeding, ambulation, home making, sitter     Extremity/Trunk Assessment   Upper Extremity Assessment Upper Extremity Assessment: Defer to OT evaluation    Lower Extremity Assessment Lower Extremity Assessment: Difficult to assess due to impaired cognition;RLE deficits/detail RLE Deficits / Details: decreased hip flexion/knee flexion noted in supine when compared to left. Unable to formally assess due to cognition    Cervical / Trunk Assessment Cervical / Trunk Assessment:  Kyphotic  Communication   Communication Communication: Impaired Factors Affecting Communication: Reduced clarity of speech;Difficulty expressing self    Cognition Arousal: Alert Behavior During Therapy: Impulsive, Lability   PT - Cognitive impairments: Difficult to assess Difficult to assess due to: Impaired communication    PT - Cognition Comments: Pleasantly confused with decreased attention to R side. Difficult to understand speech with pt occasionally communicating in short phrases Following commands: Impaired Following commands impaired: Follows one step commands inconsistently, Follows one step commands with increased time     Cueing Cueing Techniques: Verbal cues, Tactile cues, Gestural cues, Visual cues      PT Assessment Patient needs continued PT services  PT Problem List Decreased strength;Decreased activity tolerance;Decreased balance;Decreased mobility;Decreased cognition;Decreased knowledge of use of DME;Decreased safety awareness;Decreased knowledge of precautions       PT Treatment Interventions DME instruction;Gait training;Functional mobility training;Therapeutic activities;Therapeutic exercise;Balance training;Neuromuscular re-education;Patient/family education;Stair training    PT Goals (Current goals can be found in the Care Plan section)  Acute Rehab PT Goals Patient Stated Goal: unable to state goal PT Goal Formulation: Patient unable to participate in goal setting Time For Goal Achievement: 06/14/24 Potential to Achieve Goals: Fair    Frequency Min 1X/week        AM-PAC PT 6 Clicks Mobility  Outcome Measure Help needed turning from your back to your side while in a flat bed without using bedrails?: A Lot Help needed moving from lying on your back to sitting on the side of a flat bed without using bedrails?: A Lot Help needed moving to and from a bed to a chair (including a wheelchair)?: A Little Help needed standing up from a chair using your  arms (e.g., wheelchair or bedside chair)?: A Little Help needed  to walk in hospital room?: A Little Help needed climbing 3-5 steps with a railing? : A Lot 6 Click Score: 15    End of Session Equipment Utilized During Treatment: Gait belt Activity Tolerance: Patient tolerated treatment well Patient left: in bed;with call bell/phone within reach;with family/visitor present Nurse Communication: Mobility status PT Visit Diagnosis: Unsteadiness on feet (R26.81);Other abnormalities of gait and mobility (R26.89);Muscle weakness (generalized) (M62.81);History of falling (Z91.81)    Time: 1430-1501 PT Time Calculation (min) (ACUTE ONLY): 31 min   Charges:   PT Evaluation $PT Eval Moderate Complexity: 1 Mod   PT General Charges $$ ACUTE PT VISIT: 1 Visit       Kate ORN, PT, DPT Secure Chat Preferred  Rehab Office [phone]   Kate BRAVO Wendolyn 05/31/2024, 3:38 PM

## 2024-05-31 NOTE — Evaluation (Signed)
 Clinical/Bedside Swallow Evaluation Patient Details  Name: Yvonne [name] MRN: [id number] Date of Birth: 17-Dec-1945  Today's Date: 05/31/2024 Time: SLP Start Time (ACUTE ONLY): 9146 SLP Stop Time (ACUTE ONLY): 0904 SLP Time Calculation (min) (ACUTE ONLY): 11 min  Past Medical History:  Past Medical History:  Diagnosis Date   Anxiety    Chest pressure    Degenerative arthritis    Deviated septum    Diabetes mellitus without complication (HCC)    diet controlled   Diabetic neuropathy (HCC) 12/03/2017   DJD (degenerative joint disease) of knee    Family history of bladder cancer    Family history of brain cancer    Family history of breast cancer    Family history of ovarian cancer    Family history of prostate cancer    Family history of stomach cancer    Family history of throat cancer    Family history of uterine cancer    Graves disease    RADIOACTIVE IODINE 1983   H/O hiatal hernia    Heart palpitations    History of diastolic dysfunction    per echo in 2011   Hyperlipidemia    Hypertension    Hypothyroidism    LVH (left ventricular hypertrophy)    per echo in 2011   Mitral valve prolapse    Neuropathy, peripheral    DR. JENEL 12/15   Normal nuclear stress test 2011   OSA (obstructive sleep apnea)    Polyneuropathy in other diseases classified elsewhere 05/21/2013   Rosacea 2019   MELANOMA CHEST REMOVED IN SITU   Sleep difficulties    Tinnitus    Past Surgical History:  Past Surgical History:  Procedure Laterality Date   ABDOMINAL HYSTERECTOMY  2000   TOTAL   APPENDECTOMY  1989   Brain biospy     CARDIOVASCULAR STRESS TEST  11/09/2009   EF 75%   CARPAL TUNNEL RELEASE  2002   bilateral   COLONOSCOPY WITH PROPOFOL  N/A 10/23/2018   Procedure: COLONOSCOPY WITH PROPOFOL ;  Surgeon: Teressa Toribio SQUIBB, MD;  Location: [facility];  Service: Endoscopy;  Laterality: N/A;   crainectomy     cranectomy     ESOPHAGOGASTRODUODENOSCOPY N/A 11/16/2021   Procedure:  ESOPHAGOGASTRODUODENOSCOPY (EGD);  Surgeon: Teressa Toribio SQUIBB, MD;  Location: [facility];  Service: Endoscopy;  Laterality: N/A;   ESOPHAGOGASTRODUODENOSCOPY (EGD) WITH PROPOFOL  N/A 10/23/2018   Procedure: ESOPHAGOGASTRODUODENOSCOPY (EGD) WITH PROPOFOL ;  Surgeon: Teressa Toribio SQUIBB, MD;  Location: [facility];  Service: Endoscopy;  Laterality: N/A;   EUS N/A 10/23/2018   Procedure: UPPER ENDOSCOPIC ULTRASOUND (EUS) RADIAL;  Surgeon: Teressa Toribio SQUIBB, MD;  Location: [facility];  Service: Endoscopy;  Laterality: N/A;   EUS N/A 11/16/2021   Procedure: UPPER ENDOSCOPIC ULTRASOUND (EUS) RADIAL;  Surgeon: Teressa Toribio SQUIBB, MD;  Location: [facility];  Service: Endoscopy;  Laterality: N/A;   MASS EXCISION Left 06/02/2019   Procedure: EXCISION OF SUBCUTANEOUS ABDOMINAL WALL MASSES LEFT LOWER QUADRANT;  Surgeon: Belinda Cough, MD;  Location: [facility];  Service: General;  Laterality: Left;   TONSILLECTOMY  1958   TRANSTHORACIC ECHOCARDIOGRAM  11/01/2009   NORMAL LV FILLING AND DIASTOLIC DYSFUNCTION AND MILD AORTIC SCLEROSIS AND TRACE MITRAL REGURGITATION   HPI:  Yvonne [name] is a 78 y.o. female with PMH stage IV glioblastoma temporal lobe, diabetes, Graves' disease, diabetic neuropathy, OSA.  Presented to [facility] 10/17 with status epilepticus, aborted with fosphenytoin  20 mg/kg load. Patient was recently admitted on 24 September and discharged on 28  September with same presentation. BSE 9/26 rec for NPO and unable to transit po's due to post-ictal state; f/u following day and pt with improvement, reg/thin recommended.    Assessment / Plan / Recommendation  Clinical Impression  Clinical presentation much improved from prior admission and results of BSE at that time with dtr and friend present. Today she is alert, follows commands showing minimal right sided facial weakness; dentition is intact. Oral transit adequate with puree and thin. Timely mastication with regular texture  and able to clear all  trials completely. There were no s/s aspiration across trials and multiple sips thin via straw. Family states she has been taking  pills with thin without difficulty. Recommend continue regular/thin, pills with thin and general aspiration precautions. Educated that swallow appeared functional at present however with dx glioblastoma her swallow could decline. Family requeating foam handles for utensils for self feeding and SLP was able to provide this. No further ST recommended at this time and family in agreement. SLP Visit Diagnosis: Dysphagia, unspecified (R13.10)    Aspiration Risk  Mild aspiration risk (given hx glioblsatoma)    Diet Recommendation Regular;Thin liquid    Liquid Administration via: Cup;Straw Medication Administration: Whole meds with liquid Supervision: Patient able to self feed (used foam grips on utensils) Postural Changes: Seated upright at 90 degrees    Other  Recommendations Oral Care Recommendations: Oral care BID     Assistance Recommended at Discharge    Functional Status Assessment Patient has not had a recent decline in their functional status  Frequency and Duration            Prognosis        Swallow Study   General Date of Onset: 05/29/24 HPI: Yvonne [name] is a 78 y.o. female with PMH stage IV glioblastoma temporal lobe, diabetes, Graves' disease, diabetic neuropathy, OSA.  Presented to [facility] 10/17 with status epilepticus, aborted with fosphenytoin  20 mg/kg load. Patient was recently admitted on 24 September and discharged on 28 September with same presentation. BSE 9/26 rec for NPO and unable to transit po's due to post-ictal state; f/u following day and pt with improvement, reg/thin recommended. Type of Study: Bedside Swallow Evaluation Previous Swallow Assessment:  (see HPI) Diet Prior to this Study: Regular;Thin liquids (Level 0) Temperature Spikes Noted: No Respiratory Status: Room air History of Recent Intubation:  No Behavior/Cognition: Alert;Cooperative;Pleasant mood Oral Cavity Assessment:  (lingual candidias) Oral Care Completed by SLP: No Oral Cavity - Dentition: Adequate natural dentition Vision: Functional for self-feeding Self-Feeding Abilities: Able to feed self Patient Positioning: Upright in bed Baseline Vocal Quality: Normal Volitional Cough: Weak Volitional Swallow: Able to elicit    Oral/Motor/Sensory Function Overall Oral Motor/Sensory Function: Mild impairment Facial ROM: Reduced right;Suspected CN VII (facial) dysfunction Facial Symmetry: Abnormal symmetry right Lingual ROM: Within Functional Limits Lingual Symmetry: Within Functional Limits   Ice Chips Ice chips: Not tested   Thin Liquid Thin Liquid: Within functional limits Presentation: Cup;Straw Oral Phase Impairments:  (none) Oral Phase Functional Implications:  (none) Pharyngeal  Phase Impairments:  (none)    Nectar Thick Nectar Thick Liquid: Not tested   Honey Thick Honey Thick Liquid: Not tested   Puree Puree: Within functional limits Oral Phase Impairments:  (none) Oral Phase Functional Implications:  (none) Pharyngeal Phase Impairments:  (none)   Solid     Solid: Within functional limits      Dustin Olam Bull 05/31/2024,9:25 AM

## 2024-05-31 NOTE — Progress Notes (Signed)
 Progress Note   Patient: Yvonne [name] FMW:[id number] DOB: 02/18/1946 DOA: 05/29/2024     1 DOS: the patient was seen and examined on 05/31/2024    Brief hospital course: Yvonne [name] is a 78 y.o. female with PMH stage IV glioblastoma temporal lobe, diabetes, Graves' disease, diabetic neuropathy, OSA.  Presented to [facility] 10/17 with status epilepticus, aborted with fosphenytoin  20 mg/kg load. Patient was recently admitted on 24 September and discharged on 28 September with same presentation.   Assessment and Plan:  Seizure  Stage IV glioblastoma multiforme: Patient still in confused postictal state.  Known progression with increase in size of lesion.  Neurology consulted, recommend maximizing Keppra  and Vimpat , starting Dilantin , and increasing home Decadron . - SLP for swallow eval - Aspiration, seizure, and fall precautions - Neurochecks every 4 hours - Keppra  1500 mg IV every 12 hours starting 10/18 - Vimpat  200 mg IV every 12 hours starting 10/18 - Phenytoin  IV 150 mg twice daily, new med starting 10/18 AM - Decadron  IV 4 mg every 12 starting 10/18  Addendum 10/18 @2 :28pm: Per neurology Dr. Sallyann, daughter did not want phenytoin  so neurology ordered valproate instead, 340 mg IV every 12, new meds starting 10/18 AM.  UTI Urine cultured not yet resulted, will follow culture sensitivity.  -Continue Rocephin until able to narrow and convert to p.o.  Hypothyroidism: Continue Synthroid .   Essential hypertension: Stable. Continue patient's metoprolol .   Diabetes mellitus type 2: Stable. Continue sliding scale glycemic protocol.  After SLP swallow test, plan for diabetic diet.    Subjective:  Found Yvonne [name] laying in bed with family at bedside.  Patient was sleeping but easily arousable. She had no new complaints. Family requesting for PT today and also stated she had not has a BM since admission.  Physical Exam: BP (!) 138/97 (BP Location: Right Arm)   Pulse 64   Temp  98.6 F (37 C) (Oral)   Resp 19   Ht 5' 8 (1.727 m)   Wt 68 kg   SpO2 97%   BMI 22.81 kg/m    General appearance: Alert and in no distress.  Responds appropriately to questions.  Eye contact, dress and hygiene appropriate. HEENT: Anicteric, conjunctivae and sclerae normal without injection or icterus, lids and lashes normal.   Cor: RRR, without murmurs, rubs.  No LE edema Resp:  Normal respiratory rate and rhythm.  CTAB without rales or wheezes. Abd:  No TTP or rebound all quadrants.  No masses or organomegaly.  MSK: Symmetrical without gross deformities of the hands, large joints, or legs. Skin:  Skin intact without significant rashes or lesions. Neuro: Speech is dysphasic and intermittently difficult to understand. Psych: Limited attention span, attention drifts.  Alert but unable to state her name, year, location, or president.  Data Reviewed: Urine culture-not yet resulted Respiratory panel negative CXR unremarkable CT head without contrast showing increase in lesion size with chronic encephalomalacia CBC with WBC 11.1  Recent Labs  Lab 05/29/24 1028 05/30/24 0539 05/30/24 1233 05/30/24 1427  NA 136 139 136  --   K 3.2* 3.5 4.1  --   CO2 25 22 16*  --   BUN 13 13 14   --   CREATININE 0.69 0.52 0.59  --   MG 1.7  --   --   --   WBC 8.0 11.1*  --  13.1*  HGB 12.5 13.1  --  12.7  PLT 158 151  --  158   Family Communication: Daughter  Yvonne [name] at bedside  Disposition: Status is: Observation The patient remains OBS appropriate and will d/c before 2 midnights.  Author: Landon FORBES Baller, MD 05/31/2024 9:38 AM  For on call review [email or web address].

## 2024-06-01 ENCOUNTER — Telehealth: Payer: Self-pay | Admitting: *Deleted

## 2024-06-01 ENCOUNTER — Other Ambulatory Visit (HOSPITAL_COMMUNITY): Payer: Self-pay

## 2024-06-01 ENCOUNTER — Encounter: Payer: Self-pay | Admitting: Internal Medicine

## 2024-06-01 DIAGNOSIS — C713 Malignant neoplasm of parietal lobe: Secondary | ICD-10-CM

## 2024-06-01 DIAGNOSIS — G40109 Localization-related (focal) (partial) symptomatic epilepsy and epileptic syndromes with simple partial seizures, not intractable, without status epilepticus: Secondary | ICD-10-CM

## 2024-06-01 DIAGNOSIS — R569 Unspecified convulsions: Secondary | ICD-10-CM | POA: Diagnosis not present

## 2024-06-01 LAB — GLUCOSE, CAPILLARY
Glucose-Capillary: 112 mg/dL — ABNORMAL HIGH (ref 70–99)
Glucose-Capillary: 116 mg/dL — ABNORMAL HIGH (ref 70–99)
Glucose-Capillary: 130 mg/dL — ABNORMAL HIGH (ref 70–99)
Glucose-Capillary: 132 mg/dL — ABNORMAL HIGH (ref 70–99)
Glucose-Capillary: 211 mg/dL — ABNORMAL HIGH (ref 70–99)

## 2024-06-01 LAB — VALPROIC ACID LEVEL: Valproic Acid Lvl: 47 ug/mL — ABNORMAL LOW (ref 50–100)

## 2024-06-01 LAB — AMMONIA: Ammonia: 41 umol/L — ABNORMAL HIGH (ref 9–35)

## 2024-06-01 MED ORDER — LEVETIRACETAM 750 MG PO TABS
1500.0000 mg | ORAL_TABLET | Freq: Two times a day (BID) | ORAL | Status: DC
Start: 2024-06-01 — End: 2024-06-01
  Administered 2024-06-01: 1500 mg via ORAL
  Filled 2024-06-01: qty 2

## 2024-06-01 MED ORDER — DIVALPROEX SODIUM 250 MG PO DR TAB
250.0000 mg | DELAYED_RELEASE_TABLET | Freq: Three times a day (TID) | ORAL | 0 refills | Status: DC
  Filled 2024-06-01: qty 90, 30d supply, fill #0

## 2024-06-01 MED ORDER — DEXAMETHASONE 4 MG PO TABS
4.0000 mg | ORAL_TABLET | Freq: Two times a day (BID) | ORAL | 0 refills | Status: DC
  Filled 2024-06-01: qty 60, 30d supply, fill #0

## 2024-06-01 MED ORDER — LACOSAMIDE 200 MG PO TABS
200.0000 mg | ORAL_TABLET | Freq: Two times a day (BID) | ORAL | Status: DC
Start: 2024-06-01 — End: 2024-06-01
  Administered 2024-06-01: 200 mg via ORAL
  Filled 2024-06-01: qty 1

## 2024-06-01 MED ORDER — DIVALPROEX SODIUM 250 MG PO DR TAB
250.0000 mg | DELAYED_RELEASE_TABLET | Freq: Three times a day (TID) | ORAL | Status: DC
Start: 2024-06-01 — End: 2024-06-01
  Administered 2024-06-01: 250 mg via ORAL
  Filled 2024-06-01: qty 1

## 2024-06-01 MED ORDER — CLONAZEPAM 0.5 MG PO TBDP
0.5000 mg | ORAL_TABLET | ORAL | 0 refills | Status: DC | PRN
  Filled 2024-06-01: qty 30, 15d supply, fill #0

## 2024-06-01 MED ORDER — LEVETIRACETAM 750 MG PO TABS
1500.0000 mg | ORAL_TABLET | Freq: Two times a day (BID) | ORAL | 0 refills | Status: DC
  Filled 2024-06-01: qty 120, 30d supply, fill #0

## 2024-06-01 MED ORDER — LACOSAMIDE 200 MG PO TABS
200.0000 mg | ORAL_TABLET | Freq: Two times a day (BID) | ORAL | 0 refills | Status: DC
  Filled 2024-06-01: qty 60, 30d supply, fill #0

## 2024-06-01 NOTE — Plan of Care (Signed)
 Brief Palliative Medicine Progress Note:  PMT consult received and chart reviewed.   Noted Discharge Summary entered for today. Discussed case with attending - plan is for patient's discharge back home with hospice today. No further acute PMT needs identified.  Thank you for allowing PMT to assist in the care of this patient.  [facility] Palliative Medicine Team Team Phone: [phone] NO CHARGE

## 2024-06-01 NOTE — Telephone Encounter (Signed)
 PC to patient's daughter, Reena - informed her of Dr Eward message below, she verbalizes understanding.

## 2024-06-01 NOTE — Progress Notes (Signed)
 NEUROLOGY CONSULT FOLLOW UP NOTE   Date of service: June 01, 2024 Patient Name: Yvonne [name] MRN:  [id number] DOB:  June 24, 1946  Interval Hx/subjective   Patient sitting up in bed, no complaints. Reviewed assessment and plan of care with family at bedside and daughter on 36.   Vitals   Vitals:   05/31/24 1500 05/31/24 2005 06/01/24 0008 06/01/24 0322  BP: 124/61 (!) 145/92 (!) 150/73 (!) 158/73  Pulse: 74 78 69 63  Resp: 18 16 18 18   Temp: 98 F (36.7 C) 98.7 F (37.1 C) 98.4 F (36.9 C) 99.2 F (37.3 C)  TempSrc: Oral Oral Oral Oral  SpO2: 96% 96% 95% 96%  Weight:      Height:         Body mass index is 22.81 kg/m.  Physical Exam   Constitutional: Appears chronically ill. Eyes: No scleral injection.  HENT: No OP obstrucion.  Head: Normocephalic.  Cardiovascular: Warm and well perfused. Respiratory: Effort normal, non-labored breathing.  GI: Soft.  No distension. There is no tenderness.  Skin: WDI.   Neurologic Examination   Mental status: alert. Speech: paucity of speech with slight dysarthria. Cranial nerves: PERRL EOMI Corneals intact. Motor: Normal bulk and tone. Spontaneous movement with at least anti-gravity strength.  Sensory: Bilaterally intact, denied numbness or tingling.  Coordination: No overt ataxia noted.  Gait: deferred   Medications  Current Facility-Administered Medications:    0.9 %  sodium chloride  infusion, , Intravenous, Continuous, Tobie Mario GAILS, MD, Last Rate: 75 mL/hr at 06/01/24 0332, New Bag at 06/01/24 9667   acetaminophen  (TYLENOL ) tablet 650 mg, 650 mg, Oral, Q4H PRN **OR** acetaminophen  (TYLENOL ) suppository 650 mg, 650 mg, Rectal, Q4H PRN, Tobie Mario GAILS, MD   Chlorhexidine  Gluconate Cloth 2 % PADS 6 each, 6 each, Topical, Daily, Tobie Mario GAILS, MD, 6 each at 05/31/24 1025   dexamethasone  (DECADRON ) injection 4 mg, 4 mg, Intravenous, Q12H, Bui, Xem M, MD, 4 mg at 05/31/24 2230   feeding supplement (GLUCERNA  SHAKE) (GLUCERNA SHAKE) liquid 237 mL, 237 mL, Oral, TID BM, Macario Dorothyann CHRISTELLA, MD, 237 mL at 05/31/24 2019   insulin  aspart (novoLOG ) injection 0-9 Units, 0-9 Units, Subcutaneous, Q4H, Tobie Mario GAILS, MD, 1 Units at 06/01/24 0331   lacosamide  (VIMPAT ) 200 mg in sodium chloride  0.9 % 25 mL IVPB, 200 mg, Intravenous, Q12H, Bui, Xem M, MD, Last Rate: 90 mL/hr at 05/31/24 2250, 200 mg at 05/31/24 2250   levETIRAcetam  (KEPPRA ) undiluted injection 1,500 mg, 1,500 mg, Intravenous, Q12H, Bui, Xem M, MD, 1,500 mg at 05/31/24 2230   LORazepam  (ATIVAN ) injection 4 mg, 4 mg, Intravenous, Q5 Min x 2 PRN, Tobie Mario GAILS, MD   ondansetron  (ZOFRAN ) tablet 4 mg, 4 mg, Oral, Q6H PRN **OR** ondansetron  (ZOFRAN ) injection 4 mg, 4 mg, Intravenous, Q6H PRN, Tobie Mario GAILS, MD   Oral care mouth rinse, 15 mL, Mouth Rinse, PRN, Tobie, Mario GAILS, MD   polyethylene glycol (MIRALAX / GLYCOLAX) packet 17 g, 17 g, Oral, Daily PRN, Patel, Ekta V, MD   valproate (DEPACON) 250 mg in dextrose  5 % 50 mL IVPB, 250 mg, Intravenous, TID, Dibia, Landon BRAVO, MD, Last Rate: 52.5 mL/hr at 05/31/24 2249, 250 mg at 05/31/24 2249  Facility-Administered Medications Ordered in Other Encounters:    influenza vaccine adjuvanted (FLUAD) injection 0.5 mL, 0.5 mL, Intramuscular, Once, Vaslow, Zachary K, MD  Labs and Diagnostic Imaging   CBC:  Recent Labs  Lab 05/29/24 1028 05/30/24 0539 05/30/24 1427  WBC 8.0 11.1* 13.1*  NEUTROABS 6.2  --   --   HGB 12.5 13.1 12.7  HCT 38.2 39.6 38.6  MCV 77.3* 76.4* 78.0*  PLT 158 151 158    Basic Metabolic Panel:  Lab Results  Component Value Date   NA 136 05/30/2024   K 4.1 05/30/2024   CO2 16 (L) 05/30/2024   GLUCOSE 109 (H) 05/30/2024   BUN 14 05/30/2024   CREATININE 0.59 05/30/2024   CALCIUM  8.3 (L) 05/30/2024   GFRNONAA >60 05/30/2024   GFRAA >60 10/23/2019   Lipid Panel:  Lab Results  Component Value Date   LDLCALC 66 04/05/2022   HgbA1c:  Lab Results  Component Value Date   HGBA1C  7.7 (H) 05/29/2024   Urine Drug Screen:     Component Value Date/Time   LABOPIA NONE DETECTED 05/07/2024 0809   COCAINSCRNUR NONE DETECTED 05/07/2024 0809   LABBENZ NONE DETECTED 05/07/2024 0809   AMPHETMU NONE DETECTED 05/07/2024 0809   THCU NONE DETECTED 05/07/2024 0809   LABBARB NONE DETECTED 05/07/2024 0809    Alcohol Level     Component Value Date/Time   ETH <15 05/06/2024 0923   INR  Lab Results  Component Value Date   INR 0.9 05/06/2024   APTT  Lab Results  Component Value Date   APTT 38 (H) 05/06/2024   AED levels:  Lab Results  Component Value Date   PHENYTOIN  16.8 05/29/2024   LEVETIRACETA <2.0 (L) 03/06/2024    CT Head without contrast(Personally reviewed): L parietal lesion increased in size from 3.9 x 3.4cm -> 4.3 x 3.9cm.   Assessment   LAKETIA VICKNAIR is a 78 y.o. female with hx of GBM (L parietal lobe) c/b progression & seizures, currently in hospice, who presents with breakthrough seizures in focal status epilepticus (right face and arm jerking). CTH with progression and likely trigger for breakthrough. S/p fosphenytoin  load in ED. After discussion with daughter Reena who is her POA, loaded with VPA and started on maintenance instead to avoid over-sedation.  On exam today, patient has no complaints. No further seizure activity has been noted.   Recommendations  Change all AED medications to PO as she is clear on a diet (discussed with pharmacist). Will want to confirm that she is able to take all of these medications PO without any issues before her discharge.  - Continue VPA maintenance of 10mg /kg/day  - Continue maxed Keppra  1500mg  BID - Continue maxed Vimpat  200mg  BID - Continue increased Decadron  4mg  BID - Encourage PT/OT therapy before discharge  - Recommend contact with Hospice liaison prior to discharge for family questions (relayed to SW).  - Can use Clonazepam ODT 0.5mg  PRN for breakthrough seizures. - follow up with Dr. Buckley  outpatient. - VPA levels and ammonia is pending and to be followed up outpatient. ______________________________________________________________________   Pt seen by Neuro NP/APP and later by MD. Note/plan to be edited by MD as needed.    Rocky JAYSON Likes, DNP [facility] Please use AMION for contact information & EPIC for messaging.   NEUROHOSPITALIST ADDENDUM Performed a face to face diagnostic evaluation.   I have reviewed the contents of history and physical exam as documented by PA/ARNP/Resident and agree with above documentation.  I have discussed and formulated the above plan as documented. Edits to the note have been made as needed.  Laquashia Mergenthaler, MD [facility] [phone]   If 7pm to 7am, please call on call as listed on AMION.=

## 2024-06-01 NOTE — Progress Notes (Signed)
 Physical Therapy Treatment Patient Details Name: Yvonne [name] MRN: [id number] DOB: April 06, 1946 Today's Date: 06/01/2024   History of Present Illness Pt is a 78 y.o. female presenting 10/17 from home with breakthrough seizures. CTH shows increased size of glioblastoma.   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    PT Comments  Pt pleasant and agreeable to physical therapy session. Overall, she is mobilizing fairly, ambulating household distances with a RW and min assist. Negotiated 3 steps with bilateral railings to simulate home set up. Continues to have right drift and right sided weakness, requiring intermittent steering assist for RW. Reviewed guarding and home set up with pt daughter who verbalized understanding. Plan for d/c home with hospice today.    If plan is discharge home, recommend the following: Direct supervision/assist for medications management;Direct supervision/assist for financial management;Assist for transportation;Help with stairs or ramp for entrance;Assistance with cooking/housework;Supervision due to cognitive status;A little help with walking and/or transfers;A little help with bathing/dressing/bathroom   Can travel by private vehicle        Equipment Recommendations  None recommended by PT (owns equipment)    Recommendations for Other Services       Precautions / Restrictions Precautions Precautions: Fall Recall of Precautions/Restrictions: Impaired Restrictions Weight Bearing Restrictions Per Provider Order: No     Mobility  Bed Mobility Overal bed mobility: Needs  Assistance Bed Mobility: Sit to Supine, Supine to Sit   Sidelying to sit: Supervision Supine to sit: Supervision     General bed mobility comments: No physical assist required    Transfers Overall transfer level: Needs assistance Equipment used: None Transfers: Sit to/from Stand Sit to Stand: Contact guard assist           General transfer comment: Verbal cues for hand placement, but not able to carry over    Ambulation/Gait Ambulation/Gait assistance: Min assist, +2 safety/equipment Gait Distance (Feet): 150 Feet Assistive device: Rolling walker (2 wheels) Gait Pattern/deviations: Step-through pattern, Decreased stride length, Trunk flexed, Drifts right/left Gait velocity: decreased Gait velocity interpretation: <1.8 ft/sec, indicate of risk for recurrent falls   General Gait Details: Keeps RW too far anteriorly despite cueing, intermittent assist for steering walker with turns and due to R drift   Stairs Stairs: Yes Stairs assistance: Contact guard assist Stair Management: Two rails Number of Stairs: 3     Wheelchair Mobility     Tilt Bed    Modified Rankin (Stroke Patients Only)       Balance Overall balance assessment: Needs assistance Sitting-balance support: No upper extremity supported, Feet supported Sitting balance-Leahy Scale: Good     Standing balance support: Bilateral upper extremity supported, During functional activity Standing balance-Leahy Scale: Poor                              Communication Communication Communication: Impaired Factors Affecting Communication: Reduced clarity of speech;Difficulty expressing self  Cognition Arousal: Alert Behavior During Therapy: WFL for tasks assessed/performed   PT - Cognitive impairments: Difficult to assess Difficult to assess due to: Impaired communication  PT - Cognition Comments: Pleasantly confused with decreased attention to R side. Difficult to  understand speech with pt occasionally communicating in short phrases Following commands: Impaired Following commands impaired: Follows one step commands inconsistently, Follows one step commands with increased time    Cueing Cueing Techniques: Verbal cues, Tactile cues, Gestural cues, Visual cues  Exercises      General Comments        Pertinent Vitals/Pain Pain Assessment Pain Assessment: Faces Faces Pain Scale: No hurt    Home Living                          Prior Function            PT Goals (current goals can now be found in the care plan section) Acute Rehab PT Goals Patient Stated Goal: unable to state goal PT Goal Formulation: Patient unable to participate in goal setting Time For Goal Achievement: 06/14/24 Potential to Achieve Goals: Fair Progress towards PT goals: Progressing toward goals    Frequency    Min 1X/week      PT Plan      Co-evaluation              AM-PAC PT 6 Clicks Mobility   Outcome Measure  Help needed turning from your back to your side while in a flat bed without using bedrails?: A Little Help needed moving from lying on your back to sitting on the side of a flat bed without using bedrails?: A Little Help needed moving to and from a bed to a chair (including a wheelchair)?: A Little Help needed standing up from a chair using your arms (e.g., wheelchair or bedside chair)?: A Little Help needed to walk in hospital room?: A Little Help needed climbing 3-5 steps with a railing? : A Little 6 Click Score: 18    End of Session Equipment Utilized During Treatment: Gait belt Activity Tolerance: Patient tolerated treatment well Patient left: in bed;with call bell/phone within reach;with family/visitor present Nurse Communication: Mobility status PT Visit Diagnosis: Unsteadiness on feet (R26.81);Other abnormalities of gait and mobility (R26.89);Muscle weakness (generalized) (M62.81);History of falling (Z91.81)      Time: 1137-1202 PT Time Calculation (min) (ACUTE ONLY): 25 min  Charges:    $Therapeutic Activity: 23-37 mins PT General Charges $$ ACUTE PT VISIT: 1 Visit                     Aleck Daring, PT, DPT Acute Rehabilitation Services Office [phone]    Aleck ONEIDA Daring 06/01/2024, 12:38 PM

## 2024-06-01 NOTE — Discharge Summary (Addendum)
 Physician Discharge Summary  Yvonne [name] FMW:[id number] DOB: 1945-11-29 DOA: 05/29/2024  PCP: Cleotilde Planas, MD  Admit date: 05/29/2024 Discharge date: 06/01/2024 30 Day Unplanned Readmission Risk Score    Flowsheet Row ED to Hosp-Admission (Current) from 05/29/2024 in [facility]  30 Day Unplanned Readmission Risk Score (%) 21.84 Filed at 06/01/2024 0801    This score is the patient's risk of an unplanned readmission within 30 days of being discharged (0 -100%). The score is based on dignosis, age, lab data, medications, orders, and past utilization.   Low:  0-14.9   Medium: 15-21.9   High: 22-29.9   Extreme: 30 and above          Admitted From: Home Disposition: Home with hospice  Recommendations for Outpatient Follow-up:  Follow up with PCP in 1-2 weeks Please obtain BMP/CBC in one week Follow-up with Dr. Buckley in 2 weeks Please follow up with your PCP on the following pending results: Unresulted Labs (From admission, onward)     Start     Ordered   06/01/24 1035  CBC with Differential/Platelet  ONCE - URGENT,   URGENT       Question:  Specimen collection method  Answer:  Lab=Lab collect   06/01/24 1034   06/01/24 1035  Comprehensive metabolic panel  Once,   R       Question:  Specimen collection method  Answer:  Lab=Lab collect   06/01/24 1034   06/01/24 1035  Magnesium  Once,   R       Question:  Specimen collection method  Answer:  Lab=Lab collect   06/01/24 1034   06/01/24 0816  Valproic acid level  Once,   R       Question:  Specimen collection method  Answer:  Lab=Lab collect   06/01/24 0815   06/01/24 0816  Ammonia  Once,   R       Question:  Specimen collection method  Answer:  Lab=Lab collect   06/01/24 0815              Home Health: Yes Equipment/Devices: None  Discharge Condition: Stable CODE STATUS: DNR Diet recommendation:  Diet Order             Diet regular Room service appropriate? Yes; Fluid consistency: Thin   Diet effective now                   Subjective: Seen and examined, she is fully alert and oriented, she has no complaints.  Daughter at the bedside is requesting a discharge but also requesting PT to reassess her 1 more time before she goes home.  I have sent a message to [facility] about this.  Brief/Interim Summary: Yvonne [name] is a 78 y.o. female with PMH stage IV glioblastoma temporal lobe, diabetes, Graves' disease, diabetic neuropathy, OSA.  Presented to [facility] 10/17 with status epilepticus, aborted with fosphenytoin  20 mg/kg load. Patient was recently admitted on 24 September and discharged on 28 September with same presentation.  Details of this hospitalization as below.   Seizure  Stage IV glioblastoma multiforme: Neurology consulted, recommend maximizing Keppra  and Vimpat , starting Dilantin , and increasing home Decadron  from 2 mg to 4 mg twice daily.  Patient is fully alert and oriented, swallowing very well, she has been discharged in stable condition.  Follow-up with Dr. Buckley for further recommendations about Decadron  dosage going forward.   UTI Urine cultured resulted with positive yeast only.  Patient received Rocephin  during the whole course of hospitalization/3 days.   Hypothyroidism: Continue Synthroid .   Essential hypertension: Stable. Continue patient's metoprolol .   Diabetes mellitus type 2: Resume home medications.  If patient has another seizure, call 911 and bring them back to the ED if: A.  The seizure lasts longer than 5 minutes.      B.  The patient doesn't wake shortly after the seizure or has new problems such as difficulty seeing, speaking or moving following the seizure C.  The patient was injured during the seizure D.  The patient has a temperature over 102 F (39C) E.  The patient vomited during the seizure and now is having trouble breathing    During the Seizure   - First, ensure adequate ventilation and place patients on the floor on their  left side  Loosen clothing around the neck and ensure the airway is patent. If the patient is clenching the teeth, do not force the mouth open with any object as this can cause severe damage - Remove all items from the surrounding that can be hazardous. The patient may be oblivious to what's happening and may not even know what he or she is doing. If the patient is confused and wandering, either gently guide him/her away and block access to outside areas - Reassure the individual and be comforting - Call 911. In most cases, the seizure ends before EMS arrives. However, there are cases when seizures may last over 3 to 5 minutes. Or the individual may have developed breathing difficulties or severe injuries. If a pregnant patient or a person with diabetes develops a seizure, it is prudent to call an ambulance. - Finally, if the patient does not regain full consciousness, then call EMS. Most patients will remain confused for about 45 to 90 minutes after a seizure, so you must use judgment in calling for help. - Avoid restraints but make sure the patient is in a bed with padded side rails - Place the individual in a lateral position with the neck slightly flexed; this will help the saliva drain from the mouth and prevent the tongue from falling backward - Remove all nearby furniture and other hazards from the area - Provide verbal assurance as the individual is regaining consciousness - Provide the patient with privacy if possible - Call for help and start treatment as ordered by the caregiver    After the Seizure (Postictal Stage)   After a seizure, most patients experience confusion, fatigue, muscle pain and/or a headache. Thus, one should permit the individual to sleep. For the next few days, reassurance is essential. Being calm and helping reorient the person is also of importance.   Most seizures are painless and end spontaneously. Seizures are not harmful to others but can lead to complications  such as stress on the lungs, brain and the heart. Individuals with prior lung problems may develop labored breathing and respiratory distress.  -Per [place]  DMV statutes, patients with seizures are not allowed to drive until they have been seizure-free for six months. Use caution when using heavy equipment or power tools. Avoid working on ladders or at heights. Take showers instead of baths. Ensure the water temperature is not too high on the home water heater. Do not go swimming alone. Do not lock yourself in a room alone (i.e. bathroom). When caring for infants or small children, sit down when holding, feeding, or changing them to minimize risk of injury to the child in the event you have a  seizure. Maintain good sleep hygiene. Avoid alcohol.  Discharge plan was discussed with patient and/or family member and they verbalized understanding and agreed with it.  Discharge Diagnoses:  Principal Problem:   Seizure [facility])    Discharge Instructions   Allergies as of 06/01/2024       Reactions   Tobramycin Hives   Sulfa Antibiotics Other (See Comments)   Stomach upset   Erythromycin Nausea Only        Medication List     STOP taking these medications    diphenhydrAMINE-zinc acetate cream Commonly known as: BENADRYL   hydrocortisone cream 1 %   mirtazapine 7.5 MG tablet Commonly known as: REMERON   nitrofurantoin (macrocrystal-monohydrate) 100 MG capsule Commonly known as: Macrobid       TAKE these medications    acetaminophen  500 MG tablet Commonly known as: TYLENOL  Take 1,000 mg by mouth daily as needed for moderate pain (pain score 4-6).   clonazePAM 0.5 MG disintegrating tablet Commonly known as: KLONOPIN Take 1 tablet (0.5 mg total) by mouth as needed for seizure (as needed for seizure lasting for more than 5 minutes.).   dexamethasone  4 MG tablet Commonly known as: DECADRON  Take 1 tablet (4 mg total) by mouth 2 (two) times daily with a meal. What changed:   medication strength how much to take when to take this   divalproex 250 MG DR tablet Commonly known as: DEPAKOTE Take 1 tablet (250 mg total) by mouth every 8 (eight) hours.   fluticasone  50 MCG/ACT nasal spray Commonly known as: FLONASE  Place 2 sprays into both nostrils at bedtime as needed for allergies.   Jardiance 10 MG Tabs tablet Generic drug: empagliflozin Take 1 tablet by mouth once a day   lacosamide  200 MG Tabs tablet Commonly known as: VIMPAT  Take 1 tablet (200 mg total) by mouth 2 (two) times daily. What changed:  medication strength how much to take   levETIRAcetam  750 MG tablet Commonly known as: KEPPRA  Take 2 tablets (1,500 mg total) by mouth 2 (two) times daily. What changed:  medication strength how much to take   levothyroxine  88 MCG tablet Commonly known as: SYNTHROID  Take 88 mcg by mouth daily before breakfast.   LORazepam  1 MG tablet Commonly known as: ATIVAN  Take 0.5-1 mg by mouth daily as needed for seizure.   metFORMIN 500 MG tablet Commonly known as: GLUCOPHAGE Take 500 mg by mouth 2 (two) times daily with a meal.   metoprolol  succinate 100 MG 24 hr tablet Commonly known as: TOPROL -XL Take 100 mg by mouth daily. Take with or immediately following a meal.   Milk of Magnesia 400 MG/5ML suspension Generic drug: magnesium hydroxide Take 30 mLs by mouth daily as needed for constipation.   morphine CONCENTRATE 10 mg / 0.5 ml concentrated solution Take 0.25ml by mouth every four hours as needed for pain or shortness of breath.   ondansetron  8 MG tablet Commonly known as: ZOFRAN  Take 1 tablet (8 mg total) by mouth every 8 (eight) hours as needed for nausea or vomiting.   prochlorperazine  10 MG tablet Commonly known as: COMPAZINE  Take 1 tablet (10 mg total) by mouth every 6 (six) hours as needed for nausea or vomiting.   rosuvastatin  5 MG tablet Commonly known as: CRESTOR  Take 5 mg by mouth at bedtime.   Stool Softener/Laxative 50-8.6  MG tablet Generic drug: senna-docusate Take 1-4 tablets by mouth twice daily as needed for constipation.        Follow-up Information  Cleotilde Planas, MD Follow up in 1 week(s).   Specialty: Family Medicine Contact information: [address] [place] [address] [phone]         Vaslow, Zachary K, MD Follow up in 2 week(s).   Specialties: Psychiatry, Neurology, Oncology Contact information: [address] [place] [address] [phone]                Allergies  Allergen Reactions   Tobramycin Hives   Sulfa Antibiotics Other (See Comments)    Stomach upset   Erythromycin Nausea Only    Consultations: Neurology   Procedures/Studies: DG Chest Portable 1 View Result Date: 05/29/2024 EXAM: 1 VIEW(S) XRAY OF THE CHEST 05/29/2024 11:32:00 AM COMPARISON: 09/27/2017 CLINICAL HISTORY: cough. Reason for exam: cough ; Per triage notes: Patient presents to the ER via EMS from home with seizures. Patient has a hx of seizure and is on Keppra  at home. Patient normally awake, alert and orientated. Patient responsive to pain due to the 5mg  Versed  per EMS. Patient is a ; DNR. FINDINGS: LUNGS AND PLEURA: No focal pulmonary opacity. No pulmonary edema. No pleural effusion. No pneumothorax. HEART AND MEDIASTINUM: No acute abnormality of the cardiac and mediastinal silhouettes. BONES AND SOFT TISSUES: No acute osseous abnormality. IMPRESSION: 1. No acute cardiopulmonary abnormality. Electronically signed by: Lynwood Seip MD 05/29/2024 11:51 AM EDT RP Workstation: [id number]   CT Head Wo Contrast Result Date: 05/29/2024 EXAM: CT HEAD WITHOUT CONTRAST 05/29/2024 11:21:00 AM TECHNIQUE: CT of the head was performed without the administration of intravenous contrast. Automated exposure control, iterative reconstruction, and/or weight based adjustment of the mA/kV was utilized to reduce the radiation dose to as low as reasonably achievable. COMPARISON: CT of the head dated  05/06/2024. CLINICAL HISTORY: Seizure disorder, clinical change. Chief complaints; Seizures. FINDINGS: BRAIN AND VENTRICLES: No acute hemorrhage. No evidence of acute infarct. No hydrocephalus. No extra-axial collection. No mass effect or midline shift. Mixed attenuation lesion within the left parietal lobe has mildly increased in size in the interim from approximately 3.9 x 3.4 cm to approximately 4.3 x 3.9 cm. It has decreased in density in the interim along its anteromedial margin. There are chronic encephalomalacia changes again demonstrated within the left temporal lobe and an overlying craniotomy. ORBITS: No acute abnormality. SINUSES: Moderate opacification of the sphenoid sinus. Mild opacification within the maxillary sinuses bilaterally. SOFT TISSUES AND SKULL: No acute soft tissue abnormality. No skull fracture. Overlying craniotomy. IMPRESSION: 1. Mixed attenuation lesion within the left parietal lobe has mildly increased in size in the interim from approximately 3.9 x 3.4 cm to approximately 4.3 x 3.9 cm, with decreased density along its anteromedial margin, presumably secondary to focal infarct/necrosis . 2. Chronic encephalomalacia changes within the left temporal lobe and an overlying craniotomy. Electronically signed by: Evalene Coho MD 05/29/2024 11:39 AM EDT RP Workstation: [id number]   Overnight EEG with video Result Date: 05/07/2024 Shelton Arlin KIDD, MD     05/07/2024  7:45 PM Patient Name: SHAWAN TOSH MRN: [id number] Epilepsy Attending: Arlin KIDD Shelton Referring Physician/Provider: Michaela Aisha SQUIBB, MD Duration: 05/06/2024 1209 to 05/07/2024 1209  Patient history: 78 y.o. female hx of anxiety, left glioblastoma who presents for acute onset of right sided facial droop, and slurred speech. Patient appears to be having a focal seizure with right gaze preference with eyes and facial twitching. EEG to evaluate for seizure.  Level of alertness: lethargic, asleep  AEDs during EEG study:  LEV, LCM, PHT, ativan   Technical aspects: This EEG study  was done with scalp electrodes positioned according to the 10-20 International system of electrode placement. Electrical activity was reviewed with band pass filter of 1-70Hz , sensitivity of 7 uV/mm, display speed of 16mm/sec with a 60Hz  notched filter applied as appropriate. EEG data were recorded continuously and digitally stored.  Video monitoring was available and reviewed as appropriate.  Description: EEG showed continuous generalized and lateralized left hemisphere 3 to 6 Hz theta-delta slowing admixed with 15 to 18 Hz beta activity distributed symmetrically and diffusely. Sleep was characterized by vertex waves, sleep spindles (12 to 14 Hz), maximal frontocentral region. Spikes were noted in left fronto-centro-temporal region. Hyperventilation and photic stimulation were not performed.    ABNORMALITY - Spikes, left fronto-centro-temporal region. - Continuous slow, generalized  and lateralized left hemisphere  IMPRESSION: This study showed evidence of epileptogenicity arising from   left fronto-centro-temporal region. Additionally there is cortical dysfunction arising from left hemisphere likely secondary to underlying structural abnormality/ tumor. Additionally there is moderate diffuse encephalopathy. No seizures were seen throughout the recording.  Of note, focal motor seizures may not seen on scalp EEG. Clinical correlation is recommended.   Arlin MALVA Krebs   EEG adult Result Date: 05/06/2024 Krebs Arlin MALVA, MD     05/07/2024  9:07 AM Patient Name: LAMISHA ROUSSELL MRN: [id number] Epilepsy Attending: Arlin MALVA Krebs Referring Physician/Provider: Michaela Aisha SQUIBB, MD Date: 05/06/2024 Duration: 23.14 mins Patient history: 78 y.o. female hx of anxiety, left glioblastoma who presents for acute onset of right sided facial droop, and slurred speech. Patient appears to be having a focal seizure with right gaze preference with eyes and facial  twitching. EEG to evaluate for seizure. Level of alertness: lethargic, asleep AEDs during EEG study: LEV, LCM, PHT, ativan  Technical aspects: This EEG study was done with scalp electrodes positioned according to the 10-20 International system of electrode placement. Electrical activity was reviewed with band pass filter of 1-70Hz , sensitivity of 7 uV/mm, display speed of 48mm/sec with a 60Hz  notched filter applied as appropriate. EEG data were recorded continuously and digitally stored.  Video monitoring was available and reviewed as appropriate. Description: EEG showed continuous generalized and lateralized left hemisphere 3 to 6 Hz theta-delta slowing admixed with 15 to 18 Hz beta activity distributed symmetrically and diffusely. Sleep was characterized by sleep spindles (12 to 14 Hz), maximal frontocentral region. Hyperventilation and photic stimulation were not performed.   ABNORMALITY - Continuous slow, generalized  and lateralized left hemisphere IMPRESSION: This study is suggestive of cortical dysfunction arising from left hemisphere likely secondary to underlying structural abnormality/ tumor. Additionally there is moderate diffuse encephalopathy. No seizures or epileptiform discharges were seen throughout the recording. Of note, focal motor seizures may not seen on scalp EEG. Clinical correlation is recommended. Arlin MALVA Krebs   CT HEAD CODE STROKE WO CONTRAST Result Date: 05/06/2024 EXAM: CT HEAD WITHOUT CONTRAST 05/06/2024 09:34:40 AM TECHNIQUE: CT of the head was performed without the administration of intravenous contrast. Automated exposure control, iterative reconstruction, and/or weight based adjustment of the mA/kV was utilized to reduce the radiation dose to as low as reasonably achievable. COMPARISON: CT of the head dated 03/23/2024 and MRI of the brain dated 04/22/2024. CLINICAL HISTORY: Neuro deficit, acute, stroke suspected. RIGHT SIDE FACIAL DROOP; SLURRED SPEECH; LKW 0850; Neuro-  Kirkpatrick. FINDINGS: BRAIN AND VENTRICLES: No acute hemorrhage. No evidence of acute infarct. No hydrocephalus. No extra-axial collection. No mass effect or midline shift. Postsurgical encephalomalacia changes are again demonstrated within the left temporal lobe. There is a  mixed attenuation mass present within the left parietal lobe which has increased in size since the previous CT from approximately 2.6 x 2.1 cm to approximately 3.9 x 3.4 cm. The mass is now denser along its periphery with a smaller necrotic central core. [place] stroke program early CT (ASPECT) score: Ganglionic (caudate, ic, Lentiform Nucleus, insula, M1-m3): 7. Supraganglionic (m4-m6): 3. Total: 10. ORBITS: No acute abnormality. SINUSES: No acute abnormality. SOFT TISSUES AND SKULL: No acute soft tissue abnormality. No skull fracture. The above findings were communicated to Dr. Michaela at 09:40 AM 05/06/2024 via Amion. IMPRESSION: 1. Mixed attenuation mass in the left parietal lobe, increased in size since the previous CT from approximately 2.6 x 2.1 cm to approximately 3.9 x 3.4 cm, now denser along its periphery with a smaller necrotic central core. 2. Postsurgical encephalomalacia changes within the left temporal lobe. 3. ASPECT score: 10. Electronically signed by: Evalene Coho MD 05/06/2024 09:46 AM EDT RP Workstation: [id number]     Discharge Exam: Vitals:   06/01/24 0322 06/01/24 0820  BP: (!) 158/73 (!) 160/76  Pulse: 63 75  Resp: 18 19  Temp: 99.2 F (37.3 C) 98.3 F (36.8 C)  SpO2: 96% 96%   Vitals:   05/31/24 2005 06/01/24 0008 06/01/24 0322 06/01/24 0820  BP: (!) 145/92 (!) 150/73 (!) 158/73 (!) 160/76  Pulse: 78 69 63 75  Resp: 16 18 18 19   Temp: 98.7 F (37.1 C) 98.4 F (36.9 C) 99.2 F (37.3 C) 98.3 F (36.8 C)  TempSrc: Oral Oral Oral Oral  SpO2: 96% 95% 96% 96%  Weight:      Height:        General: Pt is alert, awake, not in acute distress Cardiovascular: RRR, S1/S2 +, no rubs, no  gallops Respiratory: CTA bilaterally, no wheezing, no rhonchi Abdominal: Soft, NT, ND, bowel sounds + Extremities: no edema, no cyanosis    The results of significant diagnostics from this hospitalization (including imaging, microbiology, ancillary and laboratory) are listed below for reference.     Microbiology: Recent Results (from the past 240 hours)  Resp panel by RT-PCR (RSV, Flu A&B, Covid) Urine, Clean Catch     Status: None   Collection Time: 05/29/24 10:26 AM   Specimen: Urine, Clean Catch; Nasal Swab  Result Value Ref Range Status   SARS Coronavirus 2 by RT PCR NEGATIVE NEGATIVE Final   Influenza A by PCR NEGATIVE NEGATIVE Final   Influenza B by PCR NEGATIVE NEGATIVE Final    Comment: (NOTE) The Xpert Xpress SARS-CoV-2/FLU/RSV plus assay is intended as an aid in the diagnosis of influenza from Nasopharyngeal swab specimens and should not be used as a sole basis for treatment. Nasal washings and aspirates are unacceptable for Xpert Xpress SARS-CoV-2/FLU/RSV testing.  Fact Sheet for Patients: BloggerCourse.com  Fact Sheet for Healthcare Providers: SeriousBroker.it  This test is not yet approved or cleared by the [place]  FDA and has been authorized for detection and/or diagnosis of SARS-CoV-2 by FDA under an Emergency Use Authorization (EUA). This EUA will remain in effect (meaning this test can be used) for the duration of the COVID-19 declaration under Section 564(b)(1) of the Act, 21 U.S.C. section 360bbb-3(b)(1), unless the authorization is terminated or revoked.     Resp Syncytial Virus by PCR NEGATIVE NEGATIVE Final    Comment: (NOTE) Fact Sheet for Patients: BloggerCourse.com  Fact Sheet for Healthcare Providers: SeriousBroker.it  This test is not yet approved or cleared by the [place]  FDA and has been authorized for  detection and/or diagnosis  of SARS-CoV-2 by FDA under an Emergency Use Authorization (EUA). This EUA will remain in effect (meaning this test can be used) for the duration of the COVID-19 declaration under Section 564(b)(1) of the Act, 21 U.S.C. section 360bbb-3(b)(1), unless the authorization is terminated or revoked.  Performed at [facility], [address] [place], [place] [address]   Urine Culture     Status: Abnormal   Collection Time: 05/29/24 10:26 AM   Specimen: Urine, Random  Result Value Ref Range Status   Specimen Description URINE, RANDOM  Final   Special Requests   Final    NONE Reflexed from [id number] Performed at [facility], [address], [place], [place] [address]    Culture >=100,000 COLONIES/mL YEAST (A)  Final   Report Status 05/31/2024 FINAL  Final     Labs: BNP (last 3 results) No results for input(s): BNP in the last 8760 hours. Basic Metabolic Panel: Recent Labs  Lab 05/29/24 1028 05/30/24 0539 05/30/24 1233  NA 136 139 136  K 3.2* 3.5 4.1  CL 99 105 106  CO2 25 22 16*  GLUCOSE 151* 109* 109*  BUN 13 13 14   CREATININE 0.69 0.52 0.59  CALCIUM  8.7* 8.3* 8.3*  MG 1.7  --   --    Liver Function Tests: Recent Labs  Lab 05/29/24 1028 05/30/24 0539 05/30/24 1233  AST 22 11* 19  ALT 21 17 18   ALKPHOS 49 50 52  BILITOT 0.3 0.6 0.9  PROT 5.5* 5.7* 5.9*  ALBUMIN 3.1* 3.1* 3.2*   No results for input(s): LIPASE, AMYLASE in the last 168 hours. No results for input(s): AMMONIA in the last 168 hours. CBC: Recent Labs  Lab 05/29/24 1028 05/30/24 0539 05/30/24 1427  WBC 8.0 11.1* 13.1*  NEUTROABS 6.2  --   --   HGB 12.5 13.1 12.7  HCT 38.2 39.6 38.6  MCV 77.3* 76.4* 78.0*  PLT 158 151 158   Cardiac Enzymes: No results for input(s): CKTOTAL, CKMB, CKMBINDEX, TROPONINI in the last 168 hours. BNP: Invalid input(s): POCBNP CBG: Recent Labs  Lab 05/31/24 1609 05/31/24 2006 06/01/24 0009 06/01/24 0323 06/01/24 0822  GLUCAP 246*  179* 132* 130* 116*   D-Dimer No results for input(s): DDIMER in the last 72 hours. Hgb A1c Recent Labs    05/29/24 1400  HGBA1C 7.7*   Lipid Profile No results for input(s): CHOL, HDL, LDLCALC, TRIG, CHOLHDL, LDLDIRECT in the last 72 hours. Thyroid  function studies No results for input(s): TSH, T4TOTAL, T3FREE, THYROIDAB in the last 72 hours.  Invalid input(s): FREET3 Anemia work up No results for input(s): VITAMINB12, FOLATE, FERRITIN, TIBC, IRON, RETICCTPCT in the last 72 hours. Urinalysis    Component Value Date/Time   COLORURINE YELLOW 05/29/2024 1026   APPEARANCEUR TURBID (A) 05/29/2024 1026   LABSPEC 1.025 05/29/2024 1026   PHURINE 5.0 05/29/2024 1026   GLUCOSEU >=500 (A) 05/29/2024 1026   HGBUR MODERATE (A) 05/29/2024 1026   BILIRUBINUR NEGATIVE 05/29/2024 1026   KETONESUR NEGATIVE 05/29/2024 1026   PROTEINUR NEGATIVE 05/29/2024 1026   NITRITE NEGATIVE 05/29/2024 1026   LEUKOCYTESUR MODERATE (A) 05/29/2024 1026   Sepsis Labs Recent Labs  Lab 05/29/24 1028 05/30/24 0539 05/30/24 1427  WBC 8.0 11.1* 13.1*   Microbiology Recent Results (from the past 240 hours)  Resp panel by RT-PCR (RSV, Flu A&B, Covid) Urine, Clean Catch     Status: None   Collection Time: 05/29/24 10:26 AM   Specimen: Urine,  Clean Catch; Nasal Swab  Result Value Ref Range Status   SARS Coronavirus 2 by RT PCR NEGATIVE NEGATIVE Final   Influenza A by PCR NEGATIVE NEGATIVE Final   Influenza B by PCR NEGATIVE NEGATIVE Final    Comment: (NOTE) The Xpert Xpress SARS-CoV-2/FLU/RSV plus assay is intended as an aid in the diagnosis of influenza from Nasopharyngeal swab specimens and should not be used as a sole basis for treatment. Nasal washings and aspirates are unacceptable for Xpert Xpress SARS-CoV-2/FLU/RSV testing.  Fact Sheet for Patients: BloggerCourse.com  Fact Sheet for Healthcare  Providers: SeriousBroker.it  This test is not yet approved or cleared by the [place]  FDA and has been authorized for detection and/or diagnosis of SARS-CoV-2 by FDA under an Emergency Use Authorization (EUA). This EUA will remain in effect (meaning this test can be used) for the duration of the COVID-19 declaration under Section 564(b)(1) of the Act, 21 U.S.C. section 360bbb-3(b)(1), unless the authorization is terminated or revoked.     Resp Syncytial Virus by PCR NEGATIVE NEGATIVE Final    Comment: (NOTE) Fact Sheet for Patients: BloggerCourse.com  Fact Sheet for Healthcare Providers: SeriousBroker.it  This test is not yet approved or cleared by the [place]  FDA and has been authorized for detection and/or diagnosis of SARS-CoV-2 by FDA under an Emergency Use Authorization (EUA). This EUA will remain in effect (meaning this test can be used) for the duration of the COVID-19 declaration under Section 564(b)(1) of the Act, 21 U.S.C. section 360bbb-3(b)(1), unless the authorization is terminated or revoked.  Performed at [facility], [address] [place], [place] [address]   Urine Culture     Status: Abnormal   Collection Time: 05/29/24 10:26 AM   Specimen: Urine, Random  Result Value Ref Range Status   Specimen Description URINE, RANDOM  Final   Special Requests   Final    NONE Reflexed from [id number] Performed at [facility], [address], [place], [place] [address]    Culture >=100,000 COLONIES/mL YEAST (A)  Final   Report Status 05/31/2024 FINAL  Final    FURTHER DISCHARGE INSTRUCTIONS:   Get Medicines reviewed and adjusted: Please take all your medications with you for your next visit with your Primary MD   Laboratory/radiological data: Please request your Primary MD to go over all hospital tests and procedure/radiological results at the follow up, please ask your  Primary MD to get all Hospital records sent to his/her office.   In some cases, they will be blood work, cultures and biopsy results pending at the time of your discharge. Please request that your primary care M.D. goes through all the records of your hospital data and follows up on these results.   Also Note the following: If you experience worsening of your admission symptoms, develop shortness of breath, life threatening emergency, suicidal or homicidal thoughts you must seek medical attention immediately by calling 911 or calling your MD immediately  if symptoms less severe.   You must read complete instructions/literature along with all the possible adverse reactions/side effects for all the Medicines you take and that have been prescribed to you. Take any new Medicines after you have completely understood and accpet all the possible adverse reactions/side effects.    patient was instructed, not to drive, operate heavy machinery, perform activities at heights, swimming or participation in water activities or provide baby-sitting services while on Pain, Sleep and Anxiety Medications; until their outpatient Physician has advised to do so again.  Also recommended to not to take more than prescribed Pain, Sleep and Anxiety Medications.  It is not advisable to combine anxiety, sleep and pain medications without talking with your primary care provider.     Wear Seat belts while driving.   Please note: You were cared for by a hospitalist during your hospital stay. Once you are discharged, your primary care physician will handle any further medical issues. Please note that NO REFILLS for any discharge medications will be authorized once you are discharged, as it is imperative that you return to your primary care physician (or establish a relationship with a primary care physician if you do not have one) for your post hospital discharge needs so that they can reassess your need for medications and monitor  your lab values  Time coordinating discharge: Over 30 minutes  SIGNED:   Fredia Skeeter, MD  [facility] 06/01/2024, 11:00 AM *Please note that this is a verbal dictation therefore any spelling or grammatical errors are due to the Dragon Medical One system interpretation. If 7PM-7AM, please contact night-coverage [email or web address]

## 2024-06-01 NOTE — Plan of Care (Signed)
  Problem: Fluid Volume: Goal: Ability to maintain a balanced intake and output will improve Outcome: Progressing   Problem: Metabolic: Goal: Ability to maintain appropriate glucose levels will improve Outcome: Progressing   Problem: Nutritional: Goal: Maintenance of adequate nutrition will improve Outcome: Progressing   Problem: Skin Integrity: Goal: Risk for impaired skin integrity will decrease Outcome: Progressing   Problem: Tissue Perfusion: Goal: Adequacy of tissue perfusion will improve Outcome: Progressing   Problem: Clinical Measurements: Goal: Ability to maintain clinical measurements within normal limits will improve Outcome: Progressing Goal: Will remain free from infection Outcome: Progressing Goal: Diagnostic test results will improve Outcome: Progressing Goal: Respiratory complications will improve Outcome: Progressing Goal: Cardiovascular complication will be avoided Outcome: Progressing   Problem: Activity: Goal: Risk for activity intolerance will decrease Outcome: Progressing   Problem: Nutrition: Goal: Adequate nutrition will be maintained Outcome: Progressing   Problem: Coping: Goal: Level of anxiety will decrease Outcome: Progressing   Problem: Elimination: Goal: Will not experience complications related to bowel motility Outcome: Progressing Goal: Will not experience complications related to urinary retention Outcome: Progressing   Problem: Pain Managment: Goal: General experience of comfort will improve and/or be controlled Outcome: Progressing   Problem: Safety: Goal: Ability to remain free from injury will improve Outcome: Progressing   Problem: Coping: Goal: Ability to identify appropriate support needs will improve Outcome: Progressing   Problem: Health Behavior/Discharge Planning: Goal: Compliance with prescribed medication regimen will improve Outcome: Progressing   Problem: Medication: Goal: Risk for medication side  effects will decrease Outcome: Progressing   Problem: Clinical Measurements: Goal: Diagnostic test results will improve Outcome: Progressing   Problem: Self-Concept: Goal: Level of anxiety will decrease Outcome: Progressing

## 2024-06-01 NOTE — TOC Transition Note (Signed)
 Transition of Care [facility]) - Discharge Note   Patient Details  Name: Yvonne [name] MRN: [id number] Date of Birth: 1946-07-26  [facility]) CM/SW Contact:  Andrez JULIANNA George, RN Phone Number: 06/01/2024, 12:48 PM   Clinical Narrative:     Pt is discharging home with a continuation of hospice services through [facility]. Information on the AVS.  Pts daughter is providing transport home. They have all needed DME at home.  [facility]OP is aware of dc home.   Final next level of care: Home w Hospice Care Barriers to Discharge: No Barriers Identified   Patient Goals and CMS Choice            Discharge Placement                       Discharge Plan and Services Additional resources added to the After Visit Summary for                                       Social Drivers of Health (SDOH) Interventions SDOH Screenings   Food Insecurity: No Food Insecurity (05/29/2024)  Housing: Low Risk  (05/29/2024)  Transportation Needs: No Transportation Needs (05/07/2024)  Utilities: Not At Risk (05/29/2024)  Financial Resource Strain: Low Risk  (02/20/2024)   Received from [facility]  Social Connections: Patient Unable To Answer (05/29/2024)  Recent Concern: Social Connections - Moderately Isolated (05/07/2024)  Tobacco Use: Medium Risk (05/29/2024)     Readmission Risk Interventions     No data to display

## 2024-06-01 NOTE — Telephone Encounter (Signed)
-----   Message from Arthea MARLA Manns sent at 06/01/2024  3:34 PM EDT ----- Yes, agree.  I had spoken to the neurologist in the hospital about this regimen. ----- Message ----- From: Marget Rudell ORN, RN Sent: 06/01/2024   3:26 PM EDT To: Zachary K Vaslow, MD  Reena called today, Brionna has been DC'd from the hospital.  Reena would like for you to review her seizure meds to see if you agree with the current regimen.  She is taking:  Keppra  1500 twice a day Vimpat  200 twice a day Depakote DR 250 mg every 8 hours (she is asking if this can be twice daily instead) Dexamethasone  4 mg twice a day  Please advise.

## 2024-06-01 NOTE — Discharge Instructions (Signed)
If patient has another seizure, call 911 and bring them back to the ED if: A.  The seizure lasts longer than 5 minutes.      B.  The patient doesn't wake shortly after the seizure or has new problems such as difficulty seeing, speaking or moving following the seizure C.  The patient was injured during the seizure D.  The patient has a temperature over 102 F (39C) E.  The patient vomited during the seizure and now is having trouble breathing    During the Seizure   - First, ensure adequate ventilation and place patients on the floor on their left side  Loosen clothing around the neck and ensure the airway is patent. If the patient is clenching the teeth, do not force the mouth open with any object as this can cause severe damage - Remove all items from the surrounding that can be hazardous. The patient may be oblivious to what's happening and may not even know what he or she is doing. If the patient is confused and wandering, either gently guide him/her away and block access to outside areas - Reassure the individual and be comforting - Call 911. In most cases, the seizure ends before EMS arrives. However, there are cases when seizures may last over 3 to 5 minutes. Or the individual may have developed breathing difficulties or severe injuries. If a pregnant patient or a person with diabetes develops a seizure, it is prudent to call an ambulance. - Finally, if the patient does not regain full consciousness, then call EMS. Most patients will remain confused for about 45 to 90 minutes after a seizure, so you must use judgment in calling for help. - Avoid restraints but make sure the patient is in a bed with padded side rails - Place the individual in a lateral position with the neck slightly flexed; this will help the saliva drain from the mouth and prevent the tongue from falling backward - Remove all nearby furniture and other hazards from the area - Provide verbal assurance as the individual is  regaining consciousness - Provide the patient with privacy if possible - Call for help and start treatment as ordered by the caregiver    After the Seizure (Postictal Stage)   After a seizure, most patients experience confusion, fatigue, muscle pain and/or a headache. Thus, one should permit the individual to sleep. For the next few days, reassurance is essential. Being calm and helping reorient the person is also of importance.   Most seizures are painless and end spontaneously. Seizures are not harmful to others but can lead to complications such as stress on the lungs, brain and the heart. Individuals with prior lung problems may develop labored breathing and respiratory distress.  -Per [facility] statutes, patients with seizures are not allowed to drive until they have been seizure-free for six months. Use caution when using heavy equipment or power tools. Avoid working on ladders or at heights. Take showers instead of baths. Ensure the water temperature is not too high on the home water heater. Do not go swimming alone. Do not lock yourself in a room alone (i.e. bathroom). When caring for infants or small children, sit down when holding, feeding, or changing them to minimize risk of injury to the child in the event you have a seizure. Maintain good sleep hygiene. Avoid alcohol.

## 2024-06-05 ENCOUNTER — Other Ambulatory Visit (HOSPITAL_BASED_OUTPATIENT_CLINIC_OR_DEPARTMENT_OTHER): Payer: Self-pay

## 2024-06-05 MED ORDER — SIMETHICONE 125 MG PO CAPS
125.0000 mg | ORAL_CAPSULE | Freq: Four times a day (QID) | ORAL | 0 refills | Status: DC
  Filled 2024-06-05: qty 30, 8d supply, fill #0

## 2024-06-08 ENCOUNTER — Other Ambulatory Visit (HOSPITAL_BASED_OUTPATIENT_CLINIC_OR_DEPARTMENT_OTHER): Payer: Self-pay

## 2024-06-08 MED ORDER — OMEPRAZOLE 40 MG PO CPDR
40.0000 mg | DELAYED_RELEASE_CAPSULE | Freq: Every day | ORAL | 3 refills | Status: DC
  Filled 2024-06-08: qty 15, 15d supply, fill #0

## 2024-06-10 ENCOUNTER — Encounter: Payer: Self-pay | Admitting: Internal Medicine

## 2024-06-10 ENCOUNTER — Inpatient Hospital Stay (HOSPITAL_COMMUNITY)
Admission: EM | Admit: 2024-06-10 | Discharge: 2024-06-12 | DRG: 100 | Disposition: A | Discharge status: Hospice | Attending: Internal Medicine | Admitting: Internal Medicine

## 2024-06-10 ENCOUNTER — Inpatient Hospital Stay (HOSPITAL_COMMUNITY)

## 2024-06-10 ENCOUNTER — Emergency Department (HOSPITAL_COMMUNITY)

## 2024-06-10 DIAGNOSIS — E1140 Type 2 diabetes mellitus with diabetic neuropathy, unspecified: Secondary | ICD-10-CM | POA: Diagnosis present

## 2024-06-10 DIAGNOSIS — Z85820 Personal history of malignant melanoma of skin: Secondary | ICD-10-CM | POA: Diagnosis not present

## 2024-06-10 DIAGNOSIS — Z882 Allergy status to sulfonamides status: Secondary | ICD-10-CM

## 2024-06-10 DIAGNOSIS — Z66 Do not resuscitate: Secondary | ICD-10-CM | POA: Diagnosis present

## 2024-06-10 DIAGNOSIS — Z515 Encounter for palliative care: Secondary | ICD-10-CM

## 2024-06-10 DIAGNOSIS — Z90710 Acquired absence of both cervix and uterus: Secondary | ICD-10-CM

## 2024-06-10 DIAGNOSIS — R0902 Hypoxemia: Secondary | ICD-10-CM | POA: Diagnosis present

## 2024-06-10 DIAGNOSIS — C719 Malignant neoplasm of brain, unspecified: Secondary | ICD-10-CM | POA: Diagnosis present

## 2024-06-10 DIAGNOSIS — Z79890 Hormone replacement therapy (postmenopausal): Secondary | ICD-10-CM

## 2024-06-10 DIAGNOSIS — Z79899 Other long term (current) drug therapy: Secondary | ICD-10-CM | POA: Diagnosis not present

## 2024-06-10 DIAGNOSIS — Z8249 Family history of ischemic heart disease and other diseases of the circulatory system: Secondary | ICD-10-CM

## 2024-06-10 DIAGNOSIS — Z833 Family history of diabetes mellitus: Secondary | ICD-10-CM | POA: Diagnosis not present

## 2024-06-10 DIAGNOSIS — Z881 Allergy status to other antibiotic agents status: Secondary | ICD-10-CM

## 2024-06-10 DIAGNOSIS — R262 Difficulty in walking, not elsewhere classified: Secondary | ICD-10-CM | POA: Diagnosis present

## 2024-06-10 DIAGNOSIS — G4733 Obstructive sleep apnea (adult) (pediatric): Secondary | ICD-10-CM | POA: Diagnosis present

## 2024-06-10 DIAGNOSIS — G40909 Epilepsy, unspecified, not intractable, without status epilepticus: Secondary | ICD-10-CM | POA: Diagnosis not present

## 2024-06-10 DIAGNOSIS — G936 Cerebral edema: Secondary | ICD-10-CM | POA: Diagnosis present

## 2024-06-10 DIAGNOSIS — E039 Hypothyroidism, unspecified: Secondary | ICD-10-CM | POA: Diagnosis present

## 2024-06-10 DIAGNOSIS — D72829 Elevated white blood cell count, unspecified: Secondary | ICD-10-CM | POA: Diagnosis present

## 2024-06-10 DIAGNOSIS — I341 Nonrheumatic mitral (valve) prolapse: Secondary | ICD-10-CM | POA: Diagnosis present

## 2024-06-10 DIAGNOSIS — E02 Subclinical iodine-deficiency hypothyroidism: Secondary | ICD-10-CM | POA: Diagnosis not present

## 2024-06-10 DIAGNOSIS — I10 Essential (primary) hypertension: Secondary | ICD-10-CM | POA: Diagnosis present

## 2024-06-10 DIAGNOSIS — Z7952 Long term (current) use of systemic steroids: Secondary | ICD-10-CM

## 2024-06-10 DIAGNOSIS — N390 Urinary tract infection, site not specified: Secondary | ICD-10-CM | POA: Diagnosis present

## 2024-06-10 DIAGNOSIS — I959 Hypotension, unspecified: Secondary | ICD-10-CM | POA: Diagnosis present

## 2024-06-10 DIAGNOSIS — G9389 Other specified disorders of brain: Secondary | ICD-10-CM | POA: Diagnosis present

## 2024-06-10 DIAGNOSIS — R569 Unspecified convulsions: Secondary | ICD-10-CM

## 2024-06-10 DIAGNOSIS — E860 Dehydration: Secondary | ICD-10-CM | POA: Diagnosis present

## 2024-06-10 DIAGNOSIS — E785 Hyperlipidemia, unspecified: Secondary | ICD-10-CM | POA: Diagnosis present

## 2024-06-10 DIAGNOSIS — F419 Anxiety disorder, unspecified: Secondary | ICD-10-CM | POA: Diagnosis present

## 2024-06-10 DIAGNOSIS — G4089 Other seizures: Secondary | ICD-10-CM | POA: Diagnosis not present

## 2024-06-10 DIAGNOSIS — G40901 Epilepsy, unspecified, not intractable, with status epilepticus: Secondary | ICD-10-CM | POA: Diagnosis present

## 2024-06-10 DIAGNOSIS — Z87891 Personal history of nicotine dependence: Secondary | ICD-10-CM | POA: Diagnosis not present

## 2024-06-10 DIAGNOSIS — E0500 Thyrotoxicosis with diffuse goiter without thyrotoxic crisis or storm: Secondary | ICD-10-CM | POA: Diagnosis present

## 2024-06-10 DIAGNOSIS — Z7984 Long term (current) use of oral hypoglycemic drugs: Secondary | ICD-10-CM | POA: Diagnosis not present

## 2024-06-10 DIAGNOSIS — E119 Type 2 diabetes mellitus without complications: Secondary | ICD-10-CM

## 2024-06-10 LAB — URINALYSIS, ROUTINE W REFLEX MICROSCOPIC
Bilirubin Urine: NEGATIVE
Glucose, UA: >=500 mg/dL — AB
Ketones, ur: 20 mg/dL — AB
Nitrite: NEGATIVE
Protein, ur: 30 mg/dL — AB
Specific Gravity, Urine: 1.028 (ref 1.005–1.030)
WBC, UA: >50 WBC/hpf (ref 0–5)
pH: 5 (ref 5.0–8.0)

## 2024-06-10 LAB — VALPROIC ACID LEVEL: Valproic Acid Lvl: 51 ug/mL (ref 50–100)

## 2024-06-10 LAB — BASIC METABOLIC PANEL WITH GFR
Anion gap: 14 (ref 5–15)
BUN: 20 mg/dL (ref 8–23)
CO2: 25 mmol/L (ref 22–32)
Calcium: 7.7 mg/dL — ABNORMAL LOW (ref 8.9–10.3)
Chloride: 98 mmol/L (ref 98–111)
Creatinine, Ser: 0.59 mg/dL (ref 0.44–1.00)
GFR, Estimated: >60 mL/min (ref >60)
Glucose, Bld: 91 mg/dL (ref 70–99)
Potassium: 3.6 mmol/L (ref 3.5–5.1)
Sodium: 137 mmol/L (ref 135–145)

## 2024-06-10 LAB — HEPATIC FUNCTION PANEL
ALT: 13 U/L (ref 0–44)
AST: 10 U/L — ABNORMAL LOW (ref 15–41)
Albumin: 2 g/dL — ABNORMAL LOW (ref 3.5–5.0)
Alkaline Phosphatase: 31 U/L — ABNORMAL LOW (ref 38–126)
Bilirubin, Direct: <0.1 mg/dL (ref 0.0–0.2)
Total Bilirubin: 0.6 mg/dL (ref 0.0–1.2)
Total Protein: 4.2 g/dL — ABNORMAL LOW (ref 6.5–8.1)

## 2024-06-10 LAB — AMMONIA: Ammonia: 32 umol/L (ref 9–35)

## 2024-06-10 LAB — CBC
HCT: 42.3 % (ref 36.0–46.0)
Hemoglobin: 13.9 g/dL (ref 12.0–15.0)
MCH: 25.4 pg — ABNORMAL LOW (ref 26.0–34.0)
MCHC: 32.9 g/dL (ref 30.0–36.0)
MCV: 77.2 fL — ABNORMAL LOW (ref 80.0–100.0)
Platelets: 127 K/uL — ABNORMAL LOW (ref 150–400)
RBC: 5.48 MIL/uL — ABNORMAL HIGH (ref 3.87–5.11)
RDW: 15.2 % (ref 11.5–15.5)
WBC: 13.2 K/uL — ABNORMAL HIGH (ref 4.0–10.5)
nRBC: 0 % (ref 0.0–0.2)

## 2024-06-10 LAB — GLUCOSE, CAPILLARY: Glucose-Capillary: 110 mg/dL — ABNORMAL HIGH (ref 70–99)

## 2024-06-10 MED ORDER — LEVETIRACETAM (KEPPRA) 500 MG/5 ML ADULT IV PUSH
1000.0000 mg | Freq: Once | INTRAVENOUS | Status: AC
  Administered 2024-06-10: 1000 mg via INTRAVENOUS
  Filled 2024-06-10: qty 10

## 2024-06-10 MED ORDER — LORAZEPAM 2 MG/ML IJ SOLN
2.0000 mg | Freq: Once | INTRAMUSCULAR | Status: AC
  Administered 2024-06-10: 2 mg via INTRAVENOUS
  Filled 2024-06-10: qty 1

## 2024-06-10 MED ORDER — VALPROATE SODIUM 100 MG/ML IV SOLN
250.0000 mg | Freq: Three times a day (TID) | INTRAVENOUS | Status: DC
  Administered 2024-06-10 – 2024-06-12 (×7): 250 mg via INTRAVENOUS
  Filled 2024-06-10: qty 2.5
  Filled 2024-06-10 (×3): qty 250
  Filled 2024-06-10: qty 2.5
  Filled 2024-06-10: qty 250
  Filled 2024-06-10 (×2): qty 2.5
  Filled 2024-06-10 (×2): qty 250
  Filled 2024-06-10: qty 2.5

## 2024-06-10 MED ORDER — SODIUM CHLORIDE 0.9 % IV BOLUS
1000.0000 mL | Freq: Once | INTRAVENOUS | Status: AC
  Administered 2024-06-10: 1000 mL via INTRAVENOUS

## 2024-06-10 MED ORDER — SODIUM CHLORIDE 0.9 % IV SOLN
1.0000 g | INTRAVENOUS | Status: DC
  Administered 2024-06-11 – 2024-06-12 (×2): 1 g via INTRAVENOUS
  Filled 2024-06-10 (×2): qty 10

## 2024-06-10 MED ORDER — ENOXAPARIN SODIUM 40 MG/0.4ML IJ SOSY: 40.0000 mg | PREFILLED_SYRINGE | INTRAMUSCULAR | Status: DC

## 2024-06-10 MED ORDER — LEVETIRACETAM (KEPPRA) 500 MG/5 ML ADULT IV PUSH
500.0000 mg | Freq: Once | INTRAVENOUS | Status: AC
  Administered 2024-06-10: 500 mg via INTRAVENOUS
  Filled 2024-06-10: qty 5

## 2024-06-10 MED ORDER — SODIUM CHLORIDE 0.9 % IV SOLN
200.0000 mg | Freq: Two times a day (BID) | INTRAVENOUS | Status: DC
  Administered 2024-06-10 – 2024-06-12 (×4): 200 mg via INTRAVENOUS
  Filled 2024-06-10 (×5): qty 20

## 2024-06-10 MED ORDER — LEVETIRACETAM (KEPPRA) 500 MG/5 ML ADULT IV PUSH: 1500.0000 mg | Freq: Once | INTRAVENOUS | Status: DC

## 2024-06-10 MED ORDER — LEVETIRACETAM (KEPPRA) 500 MG/5 ML ADULT IV PUSH
1500.0000 mg | Freq: Two times a day (BID) | INTRAVENOUS | Status: DC
  Administered 2024-06-10 – 2024-06-12 (×4): 1500 mg via INTRAVENOUS
  Filled 2024-06-10 (×4): qty 15

## 2024-06-10 MED ORDER — DEXAMETHASONE SODIUM PHOSPHATE 4 MG/ML IJ SOLN
4.0000 mg | Freq: Four times a day (QID) | INTRAMUSCULAR | Status: DC
  Administered 2024-06-10 – 2024-06-12 (×10): 4 mg via INTRAVENOUS
  Filled 2024-06-10 (×10): qty 1

## 2024-06-10 MED ORDER — CEFTRIAXONE SODIUM 1 G IJ SOLR
1.0000 g | Freq: Once | INTRAMUSCULAR | Status: AC
  Administered 2024-06-10: 1 g via INTRAVENOUS
  Filled 2024-06-10: qty 10

## 2024-06-10 MED ORDER — DEXAMETHASONE SODIUM PHOSPHATE 4 MG/ML IJ SOLN
4.0000 mg | Freq: Once | INTRAMUSCULAR | Status: AC
  Administered 2024-06-10: 4 mg via INTRAVENOUS
  Filled 2024-06-10: qty 1

## 2024-06-10 MED ORDER — SODIUM CHLORIDE 0.9 % IV SOLN: INTRAVENOUS | Status: DC

## 2024-06-10 MED ORDER — SODIUM CHLORIDE 0.9 % IV SOLN
20.0000 mg/kg | Freq: Once | INTRAVENOUS | Status: AC
  Administered 2024-06-10: 1360 mg via INTRAVENOUS
  Filled 2024-06-10: qty 27.2

## 2024-06-10 MED ORDER — CHLORHEXIDINE GLUCONATE CLOTH 2 % EX PADS
6.0000 | MEDICATED_PAD | Freq: Every day | CUTANEOUS | Status: DC
  Administered 2024-06-10 – 2024-06-12 (×3): 6 via TOPICAL

## 2024-06-10 MED ORDER — INSULIN ASPART 100 UNIT/ML IJ SOLN: 0.0000 [IU] | Freq: Four times a day (QID) | INTRAMUSCULAR | Status: DC

## 2024-06-10 NOTE — H&P (Addendum)
 History and Physical    Patient: Yvonne [name] FMW:[id number] DOB: 1946-01-06 DOA: 06/10/2024 DOS: the patient was seen and examined on 06/10/2024 PCP: Cleotilde Planas, MD  Patient coming from: Home via EMS  Chief Complaint:  Chief Complaint  Patient presents with   Seizures   HPI: Yvonne [name] is a 78 y.o. female with medical history significant of stage IV glioblastoma temporal lobe, diabetes, Graves' disease, diabetic neuropathy, OSA presents with recurrent seizures and significant decline in functional status.  She was recently discharged after a similar presentation on October 20th and has been under home hospice care since then. Her functional status has not returned to baseline, requiring significant assistance with daily activities such as getting out of bed, using the commode, and eating. She is unable to walk independently due to almost complete right-sided weakness. Her alertness is significantly reduced, with her being awake for only one to two hours daily.  On the morning of the visit, after taking her morning medications, including Depakote at 6 AM, she began experiencing twitching in her mouth around 7:15 AM, which progressed to facial twitching and vocal seizures. She was administered Keppra  and lacosamide  before she was unable to swallow. Neurology has been involved, and she has received multiple medications including Keppra , fosphenytoin , and Ativan  to manage her seizures.  Over the past five to six weeks, she has experienced a significant decline since the onset of her seizures. She is now mostly bedridden, with decreased oral intake and hydration, contributing to dehydration. She has had frequent stools with an applesauce consistency, possibly related to dexamethasone , and is on omeprazole to manage stomach issues. She has not been vomiting and has not bitten her tongue during seizures.  She is not currently eating or drinking due to decreased alertness, and her daughter  reports that she typically wakes up on the third day after such episodes, at which point she may or may not be hungry.  In the emergency department patient was noted to be afebrile with respirations 17-23, blood pressures as low as 80/43.  Labs significant for WBC 13.2, platelets 127, and calcium  7.7.  Urinalysis noted moderate hemoglobin, large leukocytes, rare bacteria, WBCs greater than 50.  CT imaging of the head noted similar heterogeneous mass in the left parietal lobe measuring 4.1 x 3.8 cm with mild edema and local mass effect.  Patient was given patient had been given Ativan  2 mg IV, Keppra  150 mg IV, Vimpat  200 mg IV, fosphenytoin  1360 mg, Decadron  4 mg IV, 1 L of normal saline IV fluids, and Rocephin 1 g IV  Review of Systems: unable to review all systems due to the inability of the patient to answer questions. Past Medical History:  Diagnosis Date   Anxiety    Chest pressure    Degenerative arthritis    Deviated septum    Diabetes mellitus without complication (HCC)    diet controlled   Diabetic neuropathy (HCC) 12/03/2017   DJD (degenerative joint disease) of knee    Family history of bladder cancer    Family history of brain cancer    Family history of breast cancer    Family history of ovarian cancer    Family history of prostate cancer    Family history of stomach cancer    Family history of throat cancer    Family history of uterine cancer    Graves disease    RADIOACTIVE IODINE 1983   H/O hiatal hernia    Heart palpitations  History of diastolic dysfunction    per echo in 2011   Hyperlipidemia    Hypertension    Hypothyroidism    LVH (left ventricular hypertrophy)    per echo in 2011   Mitral valve prolapse    Neuropathy, peripheral    DR. JENEL 12/15   Normal nuclear stress test 2011   OSA (obstructive sleep apnea)    Polyneuropathy in other diseases classified elsewhere 05/21/2013   Rosacea 2019   MELANOMA CHEST REMOVED IN SITU   Sleep difficulties     Tinnitus    Past Surgical History:  Procedure Laterality Date   ABDOMINAL HYSTERECTOMY  2000   TOTAL   APPENDECTOMY  1989   Brain biospy     CARDIOVASCULAR STRESS TEST  11/09/2009   EF 75%   CARPAL TUNNEL RELEASE  2002   bilateral   COLONOSCOPY WITH PROPOFOL  N/A 10/23/2018   Procedure: COLONOSCOPY WITH PROPOFOL ;  Surgeon: Teressa Toribio SQUIBB, MD;  Location: [facility];  Service: Endoscopy;  Laterality: N/A;   crainectomy     cranectomy     ESOPHAGOGASTRODUODENOSCOPY N/A 11/16/2021   Procedure: ESOPHAGOGASTRODUODENOSCOPY (EGD);  Surgeon: Teressa Toribio SQUIBB, MD;  Location: [facility];  Service: Endoscopy;  Laterality: N/A;   ESOPHAGOGASTRODUODENOSCOPY (EGD) WITH PROPOFOL  N/A 10/23/2018   Procedure: ESOPHAGOGASTRODUODENOSCOPY (EGD) WITH PROPOFOL ;  Surgeon: Teressa Toribio SQUIBB, MD;  Location: [facility];  Service: Endoscopy;  Laterality: N/A;   EUS N/A 10/23/2018   Procedure: UPPER ENDOSCOPIC ULTRASOUND (EUS) RADIAL;  Surgeon: Teressa Toribio SQUIBB, MD;  Location: [facility];  Service: Endoscopy;  Laterality: N/A;   EUS N/A 11/16/2021   Procedure: UPPER ENDOSCOPIC ULTRASOUND (EUS) RADIAL;  Surgeon: Teressa Toribio SQUIBB, MD;  Location: [facility];  Service: Endoscopy;  Laterality: N/A;   MASS EXCISION Left 06/02/2019   Procedure: EXCISION OF SUBCUTANEOUS ABDOMINAL WALL MASSES LEFT LOWER QUADRANT;  Surgeon: Belinda Cough, MD;  Location: [facility];  Service: General;  Laterality: Left;   TONSILLECTOMY  1958   TRANSTHORACIC ECHOCARDIOGRAM  11/01/2009   NORMAL LV FILLING AND DIASTOLIC DYSFUNCTION AND MILD AORTIC SCLEROSIS AND TRACE MITRAL REGURGITATION   Social History:  reports that she quit smoking about 48 years ago. Her smoking use included cigarettes. She started smoking about 58 years ago. She has a 5 pack-year smoking history. She has never used smokeless tobacco. She reports that she does not currently use alcohol. She reports that she does not use drugs.  Allergies  Allergen Reactions    Tobramycin Hives   Sulfa Antibiotics Other (See Comments)    Stomach upset   Erythromycin Nausea Only    Family History  Problem Relation Age of Onset   Hypertension Mother    Heart disease Mother    Dementia Mother    Heart disease Father    Arthritis Father    Diabetes Father    Hypertension Father    Pancreatic cancer Brother 70       pancreatic   Other Brother        automobile accident   Throat cancer Maternal Uncle    Brain cancer Paternal Aunt 78       malignant brain cancer   Stroke Maternal Grandmother    Heart disease Maternal Grandfather    Pancreatic cancer Cousin 92       pancreatic (maternal first cousin)   Pancreatic cancer Cousin 63       pancreatic, smoker (maternal first cousin)   Breast cancer Cousin 66       breast (maternal  first cousin)   Ovarian cancer Cousin    Uterine cancer Cousin 12       uterine (paternal first cousin)   Cancer Cousin 67       unknown primary (paternal first cousin)   Bladder Cancer Cousin 70       (paternal first cousin)   Lung cancer Cousin    Throat cancer Cousin    Prostate cancer Cousin        (maternal first cousin)   Stomach cancer Cousin 9       (maternal first cousin)   Cancer Other 36       pancreatic neuroendocrine carcinoma   Colon cancer Neg Hx    Neuropathy Neg Hx     Prior to Admission medications   Medication Sig Start Date End Date Taking? Authorizing Provider  acetaminophen  (TYLENOL ) 500 MG tablet Take 1,000 mg by mouth daily as needed for moderate pain (pain score 4-6).    [provider]  clonazePAM (KLONOPIN) 0.5 MG disintegrating tablet Take 1 tablet (0.5 mg total) by mouth as needed for seizure (as needed for seizure lasting for more than 5 minutes.). 06/01/24   Vernon Ranks, MD  dexamethasone  (DECADRON ) 4 MG tablet Take 1 tablet (4 mg total) by mouth 2 (two) times daily with a meal. 06/01/24 07/01/24  Vernon Ranks, MD  divalproex (DEPAKOTE) 250 MG DR tablet Take 1 tablet (250 mg  total) by mouth every 8 (eight) hours. 06/01/24 07/01/24  Pahwani, Ravi, MD  empagliflozin (JARDIANCE) 10 MG TABS tablet Take 1 tablet by mouth once a day 05/18/24     fluticasone  (FLONASE ) 50 MCG/ACT nasal spray Place 2 sprays into both nostrils at bedtime as needed for allergies.    [provider]  lacosamide  (VIMPAT ) 200 MG TABS tablet Take 1 tablet (200 mg total) by mouth 2 (two) times daily. 06/01/24 07/01/24  Vernon Ranks, MD  levETIRAcetam  (KEPPRA ) 750 MG tablet Take 2 tablets (1,500 mg total) by mouth 2 (two) times daily. 06/01/24 07/01/24  Vernon Ranks, MD  levothyroxine  (SYNTHROID ) 88 MCG tablet Take 88 mcg by mouth daily before breakfast.    [provider]  LORazepam  (ATIVAN ) 1 MG tablet Take 0.5-1 mg by mouth daily as needed for seizure. 05/10/24   [provider]  magnesium hydroxide (MILK OF MAGNESIA) 400 MG/5ML suspension Take 30 mLs by mouth daily as needed for constipation. 05/12/24     metFORMIN (GLUCOPHAGE) 500 MG tablet Take 500 mg by mouth 2 (two) times daily with a meal.    [provider]  metoprolol  succinate (TOPROL -XL) 100 MG 24 hr tablet Take 100 mg by mouth daily. Take with or immediately following a meal.    [provider]  Morphine Sulfate (MORPHINE CONCENTRATE) 10 mg / 0.5 ml concentrated solution Take 0.25ml by mouth every four hours as needed for pain or shortness of breath. 05/12/24     omeprazole (PRILOSEC) 40 MG capsule Take 1 capsule (40 mg total) by mouth daily. 06/08/24     ondansetron  (ZOFRAN ) 8 MG tablet Take 1 tablet (8 mg total) by mouth every 8 (eight) hours as needed for nausea or vomiting. 10/31/23   Vaslow, Zachary K, MD  prochlorperazine  (COMPAZINE ) 10 MG tablet Take 1 tablet (10 mg total) by mouth every 6 (six) hours as needed for nausea or vomiting. 07/26/22   Vaslow, Zachary K, MD  rosuvastatin  (CRESTOR ) 5 MG tablet Take 5 mg by mouth at bedtime.    [provider]  sennosides-docusate sodium   (  SENOKOT-S) 8.6-50 MG tablet Take 1-4 tablets by mouth twice daily as needed for constipation. 05/19/24     Simethicone 125 MG CAPS Take 1 capsule (125 mg total) by mouth 4 (four) times daily for gas/bloating. 06/05/24       Physical Exam: Vitals:   06/10/24 0904 06/10/24 1130 06/10/24 1145 06/10/24 1154  BP: (!) 137/59 (!) 124/49 (!) 80/43 (!) 87/45  Pulse: 70 73 73 67  Resp: 17 18 (!) 22 (!) 23  Temp: (!) 97.3 F (36.3 C)     TempSrc: Axillary     SpO2: 97% 96% (!) 86% 96%   Constitutional: Lethargic elderly female currently unable to follow commands Eyes: PERRL, lids and conjunctivae normal ENMT: Mucous membranes are dry.  No visible tongue laceration appreciated.  Normal dentition.  Neck: normal, supple  Respiratory: clear to auscultation bilaterally, no wheezing, no crackles. Normal respiratory effort.  Currently on 2 L of nasal cannula oxygen Cardiovascular: Regular rate and rhythm, no murmurs / rubs / gallops. No extremity edema.   Abdomen: no tenderness, no masses palpated.   Musculoskeletal: no clubbing / cyanosis. No joint deformity upper and lower extremities. Good ROM, no contractures.   Skin: no rashes, lesions, ulcers. No induration Neurologic: CN 2-12 grossly intact.  Strength on right upper and lower extremity appears to be 0/5.  Patient does move left lower extremity to noxious stimuli. Psychiatric: Lethargic unable to assess.  Data Reviewed:  Reviewed labs, imaging, and pertinent records as documented  Assessment and Plan: Breakthrough seizures Stage IV glioblastoma multiform Patient presents from home due to recurrent seizure activity despite being on Keppra , Vimpat , and Dilantin .  Over the last few days patient has been sleeping more and daughter notes she is needing full assistance for all ADLs due to worsening right sided weakness related to the tumor.  CT noted similar heterogeneous mass in the left parietal lobe measuring 4.1 x 3.8 cm with mild edema and local  mass effect.  Patient was given Ativan  in addition to antiepileptics. - Admit to a progressive bed - Seizure precautions - Aspiration precautions with elevation head of bed  - Neurochecks - Follow-up EEG - Antiepileptics per neurology including Keppra  1500 mg twice daily, Vimpat  200 mg twice daily and valproic acid 250 mg every 8 hours  - Continue Decadron  4 mg IV every 6 hours - Transitions of care consulted for inpatient hospice - Appreciate neurology consultative services, will follow-up for any further recommendations  Transient hypotension Blood pressures were noted to be initially as low as 80/43 after patient had received IV antiepileptics.  Blood pressures improved with IV fluids. - Hold home blood pressure regimen - Continue IV fluids with goal maps greater than 65  Urinary tract infection Urinalysis noted moderate hemoglobin, large leukocytes, rare bacteria, and greater than 50 WBCs.  Urine culture was obtained. During last hospitalization urine culture resulted with positive yeast only and treated with 3 days of IV Rocephin. - Follow-up urine culture - Continue empiric antibiotics of Rocephin  Leukocytosis Acute.  WBC noted to be elevated up to 13.2.  Possibly secondary to above and/or steroids. - Recheck CBC tomorrow morning  Diabetes mellitus type 2 On admission glucose noted to be 91.  Last hemoglobin A1c noted to be 7.7 when checked on 05/29/2024.  Blood sugars likely elevated due to continuous steroids. - Hypoglycemic protocols - CBGs every 6 hours with very sensitive SSI - Adjust regimen as deemed medically appropriate  Hypothyroidism - Resume Synthroid  once able  DVT prophylaxis: SCDs  Advance Care Planning:   Code Status: Do not attempt resuscitation (DNR) PRE-ARREST INTERVENTIONS DESIRED    Consults: Neurology  Family Communication: Daughter updated at bedside  Severity of Illness: The appropriate patient status for this patient is INPATIENT.  Inpatient status is judged to be reasonable and necessary in order to provide the required intensity of service to ensure the patient's safety. The patient's presenting symptoms, physical exam findings, and initial radiographic and laboratory data in the context of their chronic comorbidities is felt to place them at high risk for further clinical deterioration. Furthermore, it is not anticipated that the patient will be medically stable for discharge from the hospital within 2 midnights of admission.   * I certify that at the point of admission it is my clinical judgment that the patient will require inpatient hospital care spanning beyond 2 midnights from the point of admission due to high intensity of service, high risk for further deterioration and high frequency of surveillance required.*  Author: Maximino DELENA Sharps, MD 06/10/2024 12:26 PM  For on call review [email or web address].

## 2024-06-10 NOTE — ED Notes (Signed)
 Pt placed on 2L [place] due to pt's O2 dropping to 86%. BP has dropped to 87/45. Verbal order given to start NS bolus.

## 2024-06-10 NOTE — ED Provider Notes (Incomplete)
 I provided a substantive portion of the care of this patient.  I personally made/approved the management plan for this patient and take responsibility for the patient management. {Remember to document shared critical care using edcritical dot phrase:1} EKG Interpretation Date/Time:  Wednesday June 10 2024 08:41:19 EDT Ventricular Rate:  70 PR Interval:  217 QRS Duration:  96 QT Interval:  394 QTC Calculation: 426 R Axis:   26  Text Interpretation: Sinus rhythm Borderline prolonged PR interval LVH with secondary repolarization abnormality Inferior infarct, old Anterior Q waves, possibly due to LVH No significant change since last tracing Confirmed by Randol Simmonds [phone]) on 06/10/2024 9:13:13 AM

## 2024-06-10 NOTE — ED Triage Notes (Signed)
 Pt bibems from home. Facial twitching/focal seizure started about 0730. Pt is diagnosed with a brain tumor x 2 years. Has been admitted 2x in the past month.  5mg  of midazolam  IM given by EMS

## 2024-06-10 NOTE — ED Provider Notes (Signed)
 [place] EMERGENCY DEPARTMENT AT [facility] Provider Note   CSN: [id number] Arrival date & time: 06/10/24  9170     Patient presents with: Seizures   Yvonne [name] is a 78 y.o. female with past medical history significant for hyperlipidemia, hypertension, diabetes, glioblastoma, chronic focal seizures who presents with concern for seizure this morning.  Right sided facial twitching from left-sided tumor.  Daughter at bedside reports that she was able to give her Klonopin, Keppra , and lacosamide  prior to seizure starting fully.  She received 5 mg midazolam  per EMS.  Seizure seems to be slowing at this time.  Gets Decadron  4 mg twice daily.  DNR DNI.   HPI     Prior to Admission medications   Medication Sig Start Date End Date Taking? Authorizing Provider  acetaminophen  (TYLENOL ) 500 MG tablet Take 1,000 mg by mouth daily as needed for moderate pain (pain score 4-6).    [provider]  clonazePAM (KLONOPIN) 0.5 MG disintegrating tablet Take 1 tablet (0.5 mg total) by mouth as needed for seizure (as needed for seizure lasting for more than 5 minutes.). 06/01/24   Vernon Ranks, MD  dexamethasone  (DECADRON ) 4 MG tablet Take 1 tablet (4 mg total) by mouth 2 (two) times daily with a meal. 06/01/24 07/01/24  Vernon Ranks, MD  divalproex (DEPAKOTE) 250 MG DR tablet Take 1 tablet (250 mg total) by mouth every 8 (eight) hours. 06/01/24 07/01/24  Pahwani, Ravi, MD  empagliflozin (JARDIANCE) 10 MG TABS tablet Take 1 tablet by mouth once a day 05/18/24     fluticasone  (FLONASE ) 50 MCG/ACT nasal spray Place 2 sprays into both nostrils at bedtime as needed for allergies.    [provider]  lacosamide  (VIMPAT ) 200 MG TABS tablet Take 1 tablet (200 mg total) by mouth 2 (two) times daily. 06/01/24 07/01/24  Vernon Ranks, MD  levETIRAcetam  (KEPPRA ) 750 MG tablet Take 2 tablets (1,500 mg total) by mouth 2 (two) times daily. 06/01/24 07/01/24  Vernon Ranks, MD   levothyroxine  (SYNTHROID ) 88 MCG tablet Take 88 mcg by mouth daily before breakfast.    [provider]  LORazepam  (ATIVAN ) 1 MG tablet Take 0.5-1 mg by mouth daily as needed for seizure. 05/10/24   [provider]  magnesium hydroxide (MILK OF MAGNESIA) 400 MG/5ML suspension Take 30 mLs by mouth daily as needed for constipation. 05/12/24     metFORMIN (GLUCOPHAGE) 500 MG tablet Take 500 mg by mouth 2 (two) times daily with a meal.    [provider]  metoprolol  succinate (TOPROL -XL) 100 MG 24 hr tablet Take 100 mg by mouth daily. Take with or immediately following a meal.    [provider]  Morphine Sulfate (MORPHINE CONCENTRATE) 10 mg / 0.5 ml concentrated solution Take 0.25ml by mouth every four hours as needed for pain or shortness of breath. 05/12/24     omeprazole (PRILOSEC) 40 MG capsule Take 1 capsule (40 mg total) by mouth daily. 06/08/24     ondansetron  (ZOFRAN ) 8 MG tablet Take 1 tablet (8 mg total) by mouth every 8 (eight) hours as needed for nausea or vomiting. 10/31/23   Vaslow, Zachary K, MD  prochlorperazine  (COMPAZINE ) 10 MG tablet Take 1 tablet (10 mg total) by mouth every 6 (six) hours as needed for nausea or vomiting. 07/26/22   Vaslow, Zachary K, MD  rosuvastatin  (CRESTOR ) 5 MG tablet Take 5 mg by mouth at bedtime.    [provider]  sennosides-docusate sodium  (SENOKOT-S) 8.6-50 MG tablet  Take 1-4 tablets by mouth twice daily as needed for constipation. 05/19/24     Simethicone 125 MG CAPS Take 1 capsule (125 mg total) by mouth 4 (four) times daily for gas/bloating. 06/05/24       Allergies: Tobramycin, Sulfa antibiotics, and Erythromycin    Review of Systems  All other systems reviewed and are negative.   Updated Vital Signs BP (!) 87/45   Pulse 67   Temp (!) 97.3 F (36.3 C) (Axillary)   Resp (!) 23   SpO2 96%   Physical Exam Vitals and nursing note reviewed.  Constitutional:      General: She is not in acute  distress.    Appearance: Normal appearance.  HENT:     Head: Normocephalic and atraumatic.  Eyes:     General:        Right eye: No discharge.        Left eye: No discharge.  Cardiovascular:     Rate and Rhythm: Normal rate and regular rhythm.     Heart sounds: No murmur heard.    No friction rub. No gallop.  Pulmonary:     Effort: Pulmonary effort is normal.     Breath sounds: Normal breath sounds.  Abdominal:     General: Bowel sounds are normal.     Palpations: Abdomen is soft.     Comments: Indwelling Foley catheter in place  Skin:    General: Skin is warm and dry.     Capillary Refill: Capillary refill takes less than 2 seconds.  Neurological:     Mental Status: She is alert.     Comments: Right-sided facial twitching, not responsive to questions or commands, breathing spontaneously  Psychiatric:        Mood and Affect: Mood normal.        Behavior: Behavior normal.     (all labs ordered are listed, but only abnormal results are displayed) Labs Reviewed  CBC - Abnormal; Notable for the following components:      Result Value   WBC 13.2 (*)    RBC 5.48 (*)    MCV 77.2 (*)    MCH 25.4 (*)    Platelets 127 (*)    All other components within normal limits  BASIC METABOLIC PANEL WITH GFR - Abnormal; Notable for the following components:   Calcium  7.7 (*)    All other components within normal limits  URINALYSIS, ROUTINE W REFLEX MICROSCOPIC - Abnormal; Notable for the following components:   APPearance CLOUDY (*)    Glucose, UA >=500 (*)    Hgb urine dipstick MODERATE (*)    Ketones, ur 20 (*)    Protein, ur 30 (*)    Leukocytes,Ua LARGE (*)    Bacteria, UA RARE (*)    All other components within normal limits  URINE CULTURE  VALPROIC ACID LEVEL  AMMONIA  HEPATIC FUNCTION PANEL    EKG: EKG Interpretation Date/Time:  Wednesday June 10 2024 08:41:19 EDT Ventricular Rate:  70 PR Interval:  217 QRS Duration:  96 QT Interval:  394 QTC Calculation: 426 R  Axis:   26  Text Interpretation: Sinus rhythm Borderline prolonged PR interval LVH with secondary repolarization abnormality Inferior infarct, old Anterior Q waves, possibly due to LVH No significant change since last tracing Confirmed by Randol Simmonds [phone]) on 06/10/2024 9:13:13 AM  Radiology: CT Head Wo Contrast Result Date: 06/10/2024 EXAM: CT HEAD WITHOUT CONTRAST 06/10/2024 11:02:07 AM TECHNIQUE: CT of the head was performed without the administration  of intravenous contrast. Automated exposure control, iterative reconstruction, and/or weight based adjustment of the mA/kV was utilized to reduce the radiation dose to as low as reasonably achievable. COMPARISON: 05/29/2024 CLINICAL HISTORY: FINDINGS: BRAIN AND VENTRICLES: No acute hemorrhage. No evidence of acute infarct. No hydrocephalus. No extra-axial collection. There is a somewhat ill-defined heterogeneous mass in the left parietal lobe which measures 4.1 x 3.8 cm on the current study, previously measuring 4.3 x 3.9 cm when measured in a similar manner. Internal areas of decreased density within the mass are similar to prior. There is mild associated edema. Local mass effect with partial effacement of the atrium of the left lateral ventricle is noted. Unchanged encephalomalacia in the left temporal lobe. Redemonstrated left middle cranial fossa arachnoid cyst. No midline shift. ORBITS: No acute abnormality. SINUSES: Left mastoid effusion. Mucosal thickening in bilateral sphenoid and maxillary sinuses. SOFT TISSUES AND SKULL: Prior left pterional craniotomy. No acute soft tissue abnormality. No skull fracture. IMPRESSION: 1. Similar appearance of heterogeneous mass in the left parietal lobe measuring 4.1 x 3.8 cm, previously 4.3 x 3.9 cm, with mild edema and local mass effect including partial effacement of the atrium of the left lateral ventricle. 2. Unchanged encephalomalacia in the left temporal lobe. 3. No acute findings. Electronically signed by:  Donnice Mania MD 06/10/2024 11:42 AM EDT RP Workstation: [id number]     .Critical Care  Performed by: Rosan Sherlean DEL, PA-C Authorized by: Rosan Sherlean DEL, PA-C   Critical care provider statement:    Critical care time (minutes):  35   Critical care was necessary to treat or prevent imminent or life-threatening deterioration of the following conditions:  CNS failure or compromise   Critical care was time spent personally by me on the following activities:  Development of treatment plan with patient or surrogate, discussions with consultants, evaluation of patient's response to treatment, examination of patient, ordering and review of laboratory studies, ordering and review of radiographic studies, ordering and performing treatments and interventions, pulse oximetry, re-evaluation of patient's condition and review of old charts   Care discussed with: admitting provider      Medications Ordered in the ED  cefTRIAXone (ROCEPHIN) 1 g in sodium chloride  0.9 % 100 mL IVPB (1 g Intravenous New Bag/Given 06/10/24 1207)  dexamethasone  (DECADRON ) injection 4 mg (has no administration in time range)  valproate (DEPACON) 250 mg in dextrose  5 % 50 mL IVPB (has no administration in time range)  lacosamide  (VIMPAT ) 200 mg in sodium chloride  0.9 % 25 mL IVPB (has no administration in time range)  levETIRAcetam  (KEPPRA ) undiluted injection 1,500 mg (has no administration in time range)  dexamethasone  (DECADRON ) injection 4 mg (4 mg Intravenous Given 06/10/24 0909)  levETIRAcetam  (KEPPRA ) undiluted injection 1,000 mg (1,000 mg Intravenous Given 06/10/24 0907)  levETIRAcetam  (KEPPRA ) undiluted injection 500 mg (500 mg Intravenous Given 06/10/24 1002)  LORazepam  (ATIVAN ) injection 2 mg (2 mg Intravenous Given 06/10/24 0959)  fosPHENYtoin  (CEREBYX ) 1,360 mg PE in sodium chloride  0.9 % 50 mL IVPB (0 mg PE Intravenous Stopped 06/10/24 1140)  sodium chloride  0.9 % bolus 1,000 mL (1,000 mLs Intravenous  New Bag/Given 06/10/24 1152)                                    Medical Decision Making Amount and/or Complexity of Data Reviewed Labs: ordered.  Risk Prescription drug management.   This patient is a 78 y.o. female  who  presents to the ED for concern of seizures.   Differential diagnoses prior to evaluation: The emergent differential diagnosis includes, but is not limited to, focal complex seizures, status epilepticus. This is not an exhaustive differential.   Past Medical History / Co-morbidities / Social History: hyperlipidemia, hypertension, diabetes, glioblastoma, chronic focal seizures  Additional history: Chart reviewed. Pertinent results include: Reviewed lab work, imaging from recent previous emergency department visits, hospitalizations  Physical Exam: Physical exam performed. The pertinent findings include:  Right-sided facial twitching, not responsive to questions or commands, breathing spontaneously  Indwelling Foley catheter in place   Mild htn, BP 137/59, vitals otherwise stable  Lab Tests/Imaging studies: I personally interpreted labs/imaging and the pertinent results include:  BMP overall unremarkable, CBC notable for leukocytosis, WBC 13.2, stable, likely from chronic decadron  use. Thrombocytopenia, platelets 127. UA WITH LARGE LEUKOCYTES, GREATER THAN 50 WHITE BLOOD CELLS EVERY WEEK.,  CONSISTENT WITH PROBABLE UTI CONTEXT OF FOLEY CATHETER, WILL BEGIN ROCEPHIN. Ct head shows:  1. Similar appearance of heterogeneous mass in the left parietal lobe measuring  4.1 x 3.8 cm, previously 4.3 x 3.9 cm, with mild edema and local mass effect  including partial effacement of the atrium of the left lateral ventricle.  2. Unchanged encephalomalacia in the left temporal lobe.  3. No acute findings.   I agree with the radiologist interpretation.  Cardiac monitoring: EKG obtained and interpreted by myself and attending physician which shows: Normal sinus rhythm, no  significant change from last tracing   Medications: I ordered medication including Keppra , Ativan , Decadron , fosphenytoin , for seizure Rocephin for UTI.  I have reviewed the patients home medicines and have made adjustments as needed.   Consults: Spoke with neurology, Dr. Arora, who made recommendations for Anti epileptic drugs, spoke with hospitalist, Dr. Claudene who agrees to admission for status epilepticus, postictal state, UTI.  Disposition: After consideration of the diagnostic results and the patients response to treatment, I feel that patient would benefit from admission for postictal state status post status epilepticus, UTI.    Final diagnoses:  Status epilepticus [facility])    ED Discharge Orders     None          Rosan Sherlean VEAR DEVONNA 06/10/24 1228    Randol Simmonds, MD 06/11/24 [phone]

## 2024-06-10 NOTE — Progress Notes (Signed)
   This pt is active under hospice care with [facility]. She was admitted to hospice services on 05/10/24 for primary diagnosis of malignant tumor of brain.   This hospitalization is related to hospice diagnosis. She is DNR at home.   Magdalena Berber RN [phone]

## 2024-06-10 NOTE — Progress Notes (Signed)
 Transferred Pt. per request from Nurse and Family just after LTM hook up. MB waited for Nurse and Transport to transfer Pt. To 3W11 from ED 17

## 2024-06-10 NOTE — TOC CM/SW Note (Signed)
 Transition of Care [facility]) - Inpatient Brief Assessment   Patient Details  Name: COLLYN SELK MRN: [id number] Date of Birth: 02-05-1946  Transition of Care [facility] CM/SW Contact:    Almarie CHRISTELLA Goodie, LCSW Phone Number: 06/10/2024, 3:59 PM   Clinical Narrative:    Patient from home with hospice, active with [facility]. [facility] following for disposition needs when medically ready.     Transition of Care Asessment: Insurance and Status: Insurance coverage has been reviewed Patient has primary care physician: Yes Home environment has been reviewed: Home with hospice Prior level of function:: Needs assist Prior/Current Home Services: Current home services Social Drivers of Health Review: SDOH reviewed no interventions necessary Readmission risk has been reviewed: Yes Transition of care needs: transition of care needs identified, TOC will continue to follow

## 2024-06-10 NOTE — ED Notes (Signed)
 22G in L wrist placed by EMS. Attempted to flush line, it infiltrated. IV cath removed.

## 2024-06-10 NOTE — Consult Note (Signed)
 NEUROLOGY CONSULT NOTE   Date of service: June 10, 2024 Patient Name: Yvonne [name] MRN:  [id number] DOB:  [birth year] Chief Complaint: Breakthrough seizure Requesting Provider: Randol Simmonds, MD  History of Present Illness  SPENSER [name] is a 78 y.o. female with hx of stage IV glioblastoma in the left temporal lobe, seizures, diabetes, Graves' disease, neuropathy and sleep apnea who presents with a breakthrough seizure.  She has had 2 recent admissions for breakthrough seizures, and family states that she has not quite returned to her baseline since the last episode of seizures.  Earlier this morning, when her family was getting ready to give her medications, they noticed some mouth twitching activity.  They were able to give her morning doses of her AEDs and brought her into the emergency department.  She was given lorazepam , Keppra  and loaded on fosphenytoin  with termination of seizure activity.  Family reports that patient is receiving hospice services, and they are concerned that it may be time for her to move to inpatient hospice as her seizures are difficult to control on oral medications at home.  ROS   Unable to ascertain due to altered mental status  Past History   Past Medical History:  Diagnosis Date   Anxiety    Chest pressure    Degenerative arthritis    Deviated septum    Diabetes mellitus without complication (HCC)    diet controlled   Diabetic neuropathy (HCC) 12/03/2017   DJD (degenerative joint disease) of knee    Family history of bladder cancer    Family history of brain cancer    Family history of breast cancer    Family history of ovarian cancer    Family history of prostate cancer    Family history of stomach cancer    Family history of throat cancer    Family history of uterine cancer    Graves disease    RADIOACTIVE IODINE 1983   H/O hiatal hernia    Heart palpitations    History of diastolic dysfunction    per echo in 2011   Hyperlipidemia     Hypertension    Hypothyroidism    LVH (left ventricular hypertrophy)    per echo in 2011   Mitral valve prolapse    Neuropathy, peripheral    DR. JENEL 12/15   Normal nuclear stress test 2011   OSA (obstructive sleep apnea)    Polyneuropathy in other diseases classified elsewhere 05/21/2013   Rosacea 2019   MELANOMA CHEST REMOVED IN SITU   Sleep difficulties    Tinnitus     Past Surgical History:  Procedure Laterality Date   ABDOMINAL HYSTERECTOMY  2000   TOTAL   APPENDECTOMY  1989   Brain biospy     CARDIOVASCULAR STRESS TEST  11/09/2009   EF 75%   CARPAL TUNNEL RELEASE  2002   bilateral   COLONOSCOPY WITH PROPOFOL  N/A 10/23/2018   Procedure: COLONOSCOPY WITH PROPOFOL ;  Surgeon: Teressa Toribio SQUIBB, MD;  Location: [facility];  Service: Endoscopy;  Laterality: N/A;   crainectomy     cranectomy     ESOPHAGOGASTRODUODENOSCOPY N/A 11/16/2021   Procedure: ESOPHAGOGASTRODUODENOSCOPY (EGD);  Surgeon: Teressa Toribio SQUIBB, MD;  Location: [facility];  Service: Endoscopy;  Laterality: N/A;   ESOPHAGOGASTRODUODENOSCOPY (EGD) WITH PROPOFOL  N/A 10/23/2018   Procedure: ESOPHAGOGASTRODUODENOSCOPY (EGD) WITH PROPOFOL ;  Surgeon: Teressa Toribio SQUIBB, MD;  Location: [facility];  Service: Endoscopy;  Laterality: N/A;   EUS N/A 10/23/2018   Procedure: UPPER ENDOSCOPIC ULTRASOUND (EUS)  RADIAL;  Surgeon: Teressa Toribio SQUIBB, MD;  Location: [facility];  Service: Endoscopy;  Laterality: N/A;   EUS N/A 11/16/2021   Procedure: UPPER ENDOSCOPIC ULTRASOUND (EUS) RADIAL;  Surgeon: Teressa Toribio SQUIBB, MD;  Location: [facility];  Service: Endoscopy;  Laterality: N/A;   MASS EXCISION Left 06/02/2019   Procedure: EXCISION OF SUBCUTANEOUS ABDOMINAL WALL MASSES LEFT LOWER QUADRANT;  Surgeon: Belinda Cough, MD;  Location: [facility];  Service: General;  Laterality: Left;   TONSILLECTOMY  1958   TRANSTHORACIC ECHOCARDIOGRAM  11/01/2009   NORMAL LV FILLING AND DIASTOLIC DYSFUNCTION AND MILD AORTIC SCLEROSIS AND TRACE  MITRAL REGURGITATION    Family History: Family History  Problem Relation Age of Onset   Hypertension Mother    Heart disease Mother    Dementia Mother    Heart disease Father    Arthritis Father    Diabetes Father    Hypertension Father    Pancreatic cancer Brother 25       pancreatic   Other Brother        automobile accident   Throat cancer Maternal Uncle    Brain cancer Paternal Aunt 4       malignant brain cancer   Stroke Maternal Grandmother    Heart disease Maternal Grandfather    Pancreatic cancer Cousin 15       pancreatic (maternal first cousin)   Pancreatic cancer Cousin 52       pancreatic, smoker (maternal first cousin)   Breast cancer Cousin 25       breast (maternal first cousin)   Ovarian cancer Cousin    Uterine cancer Cousin 81       uterine (paternal first cousin)   Cancer Cousin 88       unknown primary (paternal first cousin)   Bladder Cancer Cousin 81       (paternal first cousin)   Lung cancer Cousin    Throat cancer Cousin    Prostate cancer Cousin        (maternal first cousin)   Stomach cancer Cousin 18       (maternal first cousin)   Cancer Other 7       pancreatic neuroendocrine carcinoma   Colon cancer Neg Hx    Neuropathy Neg Hx     Social History  reports that she quit smoking about 48 years ago. Her smoking use included cigarettes. She started smoking about 58 years ago. She has a 5 pack-year smoking history. She has never used smokeless tobacco. She reports that she does not currently use alcohol. She reports that she does not use drugs.  Allergies  Allergen Reactions   Tobramycin Hives   Sulfa Antibiotics Other (See Comments)    Stomach upset   Erythromycin Nausea Only    Medications   Current Facility-Administered Medications:    cefTRIAXone (ROCEPHIN) 1 g in sodium chloride  0.9 % 100 mL IVPB, 1 g, Intravenous, Once, Prosperi, Christian H, PA-C, Last Rate: 200 mL/hr at 06/10/24 1207, 1 g at 06/10/24 1207   dexamethasone   (DECADRON ) injection 4 mg, 4 mg, Intravenous, Q6H, de La Torre, Cortney E, NP   lacosamide  (VIMPAT ) 200 mg in sodium chloride  0.9 % 25 mL IVPB, 200 mg, Intravenous, Q12H, de La Torre, Cortney E, NP   levETIRAcetam  (KEPPRA ) undiluted injection 1,500 mg, 1,500 mg, Intravenous, Q12H, de La Torre, Cortney E, NP   valproate (DEPACON) 250 mg in dextrose  5 % 50 mL IVPB, 250 mg, Intravenous, Q8H, de Clint Kill, Cortney E, NP  Current Outpatient Medications:    acetaminophen  (TYLENOL ) 500 MG tablet, Take 1,000 mg by mouth daily as needed for moderate pain (pain score 4-6)., Disp: , Rfl:    clonazePAM (KLONOPIN) 0.5 MG disintegrating tablet, Take 1 tablet (0.5 mg total) by mouth as needed for seizure (as needed for seizure lasting for more than 5 minutes.)., Disp: 30 tablet, Rfl: 0   dexamethasone  (DECADRON ) 4 MG tablet, Take 1 tablet (4 mg total) by mouth 2 (two) times daily with a meal., Disp: 60 tablet, Rfl: 0   divalproex (DEPAKOTE) 250 MG DR tablet, Take 1 tablet (250 mg total) by mouth every 8 (eight) hours., Disp: 90 tablet, Rfl: 0   empagliflozin (JARDIANCE) 10 MG TABS tablet, Take 1 tablet by mouth once a day, Disp: 30 tablet, Rfl: 0   fluticasone  (FLONASE ) 50 MCG/ACT nasal spray, Place 2 sprays into both nostrils at bedtime as needed for allergies., Disp: , Rfl:    lacosamide  (VIMPAT ) 200 MG TABS tablet, Take 1 tablet (200 mg total) by mouth 2 (two) times daily., Disp: 60 tablet, Rfl: 0   levETIRAcetam  (KEPPRA ) 750 MG tablet, Take 2 tablets (1,500 mg total) by mouth 2 (two) times daily., Disp: 120 tablet, Rfl: 0   levothyroxine  (SYNTHROID ) 88 MCG tablet, Take 88 mcg by mouth daily before breakfast., Disp: , Rfl:    LORazepam  (ATIVAN ) 1 MG tablet, Take 0.5-1 mg by mouth daily as needed for seizure., Disp: , Rfl:    magnesium hydroxide (MILK OF MAGNESIA) 400 MG/5ML suspension, Take 30 mLs by mouth daily as needed for constipation., Disp: 473 mL, Rfl: 1   metFORMIN (GLUCOPHAGE) 500 MG tablet, Take 500 mg  by mouth 2 (two) times daily with a meal., Disp: , Rfl:    metoprolol  succinate (TOPROL -XL) 100 MG 24 hr tablet, Take 100 mg by mouth daily. Take with or immediately following a meal., Disp: , Rfl:    Morphine Sulfate (MORPHINE CONCENTRATE) 10 mg / 0.5 ml concentrated solution, Take 0.25ml by mouth every four hours as needed for pain or shortness of breath., Disp: 30 mL, Rfl: 0   omeprazole (PRILOSEC) 40 MG capsule, Take 1 capsule (40 mg total) by mouth daily., Disp: 15 capsule, Rfl: 3   ondansetron  (ZOFRAN ) 8 MG tablet, Take 1 tablet (8 mg total) by mouth every 8 (eight) hours as needed for nausea or vomiting., Disp: 30 tablet, Rfl: 1   prochlorperazine  (COMPAZINE ) 10 MG tablet, Take 1 tablet (10 mg total) by mouth every 6 (six) hours as needed for nausea or vomiting., Disp: 30 tablet, Rfl: 0   rosuvastatin  (CRESTOR ) 5 MG tablet, Take 5 mg by mouth at bedtime., Disp: , Rfl:    sennosides-docusate sodium  (SENOKOT-S) 8.6-50 MG tablet, Take 1-4 tablets by mouth twice daily as needed for constipation., Disp: 30 tablet, Rfl: 0   Simethicone 125 MG CAPS, Take 1 capsule (125 mg total) by mouth 4 (four) times daily for gas/bloating., Disp: 30 capsule, Rfl: 0  Facility-Administered Medications Ordered in Other Encounters:    influenza vaccine adjuvanted (FLUAD) injection 0.5 mL, 0.5 mL, Intramuscular, Once, Vaslow, Zachary K, MD  Vitals   Vitals:   06/10/24 0904 06/10/24 1130 06/10/24 1145 06/10/24 1154  BP: (!) 137/59 (!) 124/49 (!) 80/43 (!) 87/45  Pulse: 70 73 73 67  Resp: 17 18 (!) 22 (!) 23  Temp: (!) 97.3 F (36.3 C)     TempSrc: Axillary     SpO2: 97% 96% (!) 86% 96%    There is no  height or weight on file to calculate BMI.   Physical Exam   Constitutional: Chronically ill-appearing elderly patient in no acute distress Eyes: No scleral injection.  HENT: No OP obstruction.  Head: Normocephalic with postsurgical changes to the left scalp.  Cardiovascular: Normal rate and regular  rhythm.  Respiratory: Snoring, slightly labored respirations on supplemental O2 Skin: WDI.   Neurologic Examination   Patient is obtunded after recent administration of lorazepam , does not respond to voice or touch, no spontaneous movement, pupils equal round and reactive to light  Labs/Imaging/Neurodiagnostic studies   CBC:  Recent Labs  Lab Jun 18, 2024 0920  WBC 13.2*  HGB 13.9  HCT 42.3  MCV 77.2*  PLT 127*   Basic Metabolic Panel:  Lab Results  Component Value Date   NA 137 06-18-2024   K 3.6 18-Jun-2024   CO2 25 June 18, 2024   GLUCOSE 91 06-18-24   BUN 20 06-18-24   CREATININE 0.59 2024/06/18   CALCIUM  7.7 (L) June 18, 2024   GFRNONAA >60 06/18/24   GFRAA >60 10/23/2019    HgbA1c:  Lab Results  Component Value Date   HGBA1C 7.7 (H) 05/29/2024   Urine Drug Screen:     Component Value Date/Time   LABOPIA NONE DETECTED 05/07/2024 0809   COCAINSCRNUR NONE DETECTED 05/07/2024 0809   LABBENZ NONE DETECTED 05/07/2024 0809   AMPHETMU NONE DETECTED 05/07/2024 0809   THCU NONE DETECTED 05/07/2024 0809   LABBARB NONE DETECTED 05/07/2024 0809    Alcohol Level     Component Value Date/Time   ETH <15 05/06/2024 0923   INR  Lab Results  Component Value Date   INR 0.9 05/06/2024   APTT  Lab Results  Component Value Date   APTT 38 (H) 05/06/2024   AED levels:  Lab Results  Component Value Date   PHENYTOIN  16.8 05/29/2024   LEVETIRACETA <2.0 (L) 03/06/2024  Valproic acid level pending Ammonia pending LFTs pending  CT Head without contrast(Personally reviewed): Similar-appearing left parietal lobe mass measuring 4.  4.1 x 3.8 cm, unchanged encephalomalacia in left temporal lobe  Neurodiagnostics Continuous EEG:  Pending  ASSESSMENT   KEIGHLEY DECKMAN is a 78 y.o. female with history of stage IV glioblastoma in the left temporal lobe, seizures, diabetes, Graves' disease, diabetic neuropathy and sleep apnea who presents with breakthrough seizures.   Patient's family reports that she has had 2 hospitalizations for breakthrough seizures in the last month and has not yet returned to her baseline from the last 1.  They state that it is getting more difficult to give her oral medications as time goes on.  This morning, when they prepared to give her her morning medications, they noticed twitching of her mouth.  Patient was still able to take her morning antiepileptic medications, and when the twitching did not cease, family brought her to the emergency department.  She was given lorazepam , Keppra  and a load of fosphenytoin  one-time-with resultant cessation of seizure activity.  She was noted to be somewhat hypoxic and hypotensive after this, supplemental O2 was applied and IV fluid bolus was given.  Patient is receiving hospice services at home, and family is concerned that it is time to move her to an inpatient hospice.  However, do anticipate that she may be having subclinical seizure activity or prolonged postictal state, so we will contact her to LTM EEG to observe for this.  Will continue home AEDs and increase Decadron  to 4 mg every 6 hours.  If subclinical seizure activity is detected, we will  consider change from Depakote to phenobarbital.  She was also noted to have a UTI and is being treated appropriately with Rocephin.  RECOMMENDATIONS  -LTM EEG - Continue Keppra  1500 mg twice daily, Vimpat  200 mg twice daily and valproic acid 250 mg every 8 hours - Decadron  4 mg every 6 hours - Check ammonia, hepatic function panel and valproic acid level - Management of UTI per primary team - Seizure precautions - Neurology will continue to follow ______________________________________________________________________  Patient seen by NP with MD, MD to edit note as needed.  Signed, Cortney E Everitt Clint Kill, NP [facility]    Attending Neurohospitalist Addendum Patient seen and examined with APP/Resident. Agree with the history and physical as  documented above. Agree with the plan as documented, which I helped formulate. I have independently reviewed the chart, obtained history, review of systems and examined the patient.I have personally reviewed pertinent head/neck/spine imaging (CT/MRI).  Patient with a known history of GBM status posttreatment presenting for breakthrough focal seizure. Has had increasing frequency of seizures and decreasing time between seizure recurrences. Suspect would need escalating doses of antiepileptics.  Family is hooked up to hospice care and understand that she may need more comfort measures related care going forward but for now, if there is anything treatable such as subclinical seizures, they would like that to be treated.  I do not disagree with that plan. She needs LTM, resumption of her home medications and increase of Decadron  from 4 mg every 12 hours to 4 mg every 6 hours.  We will check for valproate levels, ammonia and liver function test.  Will also check for any other evidence of infection that might of lowered the seizure threshold. She was given a one-time load of fosphenytoin .  Will hold off on adding a fourth antiepileptic agent at this time. Neurology will follow This was discussed with patient's daughter at bedside and with Dr. Randol at the ER  Please feel free to call with any questions.  -- Eligio Lav, MD Neurologist [facility] Pager: [phone]

## 2024-06-10 NOTE — Progress Notes (Signed)
 LTM VIDEO EEG hooked up and running - no initial skin breakdown - push button tested - [facility] is NOT monitoring due to Pt. Being in the ED at this time.

## 2024-06-11 ENCOUNTER — Inpatient Hospital Stay (HOSPITAL_COMMUNITY)

## 2024-06-11 DIAGNOSIS — G40901 Epilepsy, unspecified, not intractable, with status epilepticus: Secondary | ICD-10-CM

## 2024-06-11 DIAGNOSIS — G40909 Epilepsy, unspecified, not intractable, without status epilepticus: Secondary | ICD-10-CM

## 2024-06-11 LAB — CBC WITH DIFFERENTIAL/PLATELET
Abs Immature Granulocytes: 0.62 K/uL — ABNORMAL HIGH (ref 0.00–0.07)
Basophils Absolute: 0.1 K/uL (ref 0.0–0.1)
Basophils Relative: 1 %
Eosinophils Absolute: 0 K/uL (ref 0.0–0.5)
Eosinophils Relative: 0 %
HCT: 39.9 % (ref 36.0–46.0)
Hemoglobin: 12.9 g/dL (ref 12.0–15.0)
Immature Granulocytes: 4 %
Lymphocytes Relative: 4 %
Lymphs Abs: 0.5 K/uL — ABNORMAL LOW (ref 0.7–4.0)
MCH: 24.9 pg — ABNORMAL LOW (ref 26.0–34.0)
MCHC: 32.3 g/dL (ref 30.0–36.0)
MCV: 77 fL — ABNORMAL LOW (ref 80.0–100.0)
Monocytes Absolute: 1 K/uL (ref 0.1–1.0)
Monocytes Relative: 7 %
Neutro Abs: 11.8 K/uL — ABNORMAL HIGH (ref 1.7–7.7)
Neutrophils Relative %: 84 %
Platelets: 109 K/uL — ABNORMAL LOW (ref 150–400)
RBC: 5.18 MIL/uL — ABNORMAL HIGH (ref 3.87–5.11)
RDW: 15.2 % (ref 11.5–15.5)
WBC: 14.1 K/uL — ABNORMAL HIGH (ref 4.0–10.5)
nRBC: 0.1 % (ref 0.0–0.2)

## 2024-06-11 LAB — URINE CULTURE: Culture: NO GROWTH

## 2024-06-11 LAB — GLUCOSE, CAPILLARY
Glucose-Capillary: 112 mg/dL — ABNORMAL HIGH (ref 70–99)
Glucose-Capillary: 122 mg/dL — ABNORMAL HIGH (ref 70–99)
Glucose-Capillary: 123 mg/dL — ABNORMAL HIGH (ref 70–99)
Glucose-Capillary: 123 mg/dL — ABNORMAL HIGH (ref 70–99)

## 2024-06-11 MED ORDER — HYDROCORTISONE 1 % EX CREA
TOPICAL_CREAM | Freq: Two times a day (BID) | CUTANEOUS | Status: DC
  Administered 2024-06-12 (×2): 1 via TOPICAL
  Filled 2024-06-11 (×2): qty 28

## 2024-06-11 MED ORDER — BACITRACIN ZINC 500 UNIT/GM EX OINT
TOPICAL_OINTMENT | Freq: Two times a day (BID) | CUTANEOUS | Status: DC
  Administered 2024-06-12: 31.5 via TOPICAL
  Filled 2024-06-11: qty 28.4

## 2024-06-11 MED ORDER — LORAZEPAM 2 MG/ML IJ SOLN: 1.0000 mg | Freq: Once | INTRAMUSCULAR | Status: DC | PRN

## 2024-06-11 NOTE — Progress Notes (Signed)
 LTM EEG discontinued -  skin breakdown at [facility]. RN is aware. Multiple sits of breakdown. Most Prominent are A1,A2.  Mild abrasion at FP1,FP2 C3,C4

## 2024-06-11 NOTE — Hospital Course (Signed)
 78 y.o. female with medical history significant of stage IV glioblastoma temporal lobe, diabetes, Graves' disease, diabetic neuropathy, OSA presents with recurrent seizures and significant decline in functional status.   She was recently discharged after a similar presentation on October 20th and has been under home hospice care since then. Her functional status has not returned to baseline, requiring significant assistance with daily activities such as getting out of bed, using the commode, and eating. She is unable to walk independently due to almost complete right-sided weakness. Her alertness is significantly reduced, with her being awake for only one to two hours daily.   On the morning of the visit, after taking her morning medications, including Depakote at 6 AM, she began experiencing twitching in her mouth around 7:15 AM, which progressed to facial twitching and vocal seizures. She was administered Keppra  and lacosamide  before she was unable to swallow. Neurology has been involved, and she has received multiple medications including Keppra , fosphenytoin , and Ativan  to manage her seizures.   Over the past five to six weeks, she has experienced a significant decline since the onset of her seizures. She is now mostly bedridden, with decreased oral intake and hydration, contributing to dehydration

## 2024-06-11 NOTE — Procedures (Addendum)
 Patient Name: Yvonne [name]  MRN: [id number]  Epilepsy Attending: Arlin MALVA Krebs  Referring Physician/Provider: everitt Clint Abbey Earle FORBES, NP  Duration: 06/10/2024 1431 to 06/11/2024 1457  Patient history: 78 y.o. female with hx of stage IV glioblastoma in the left temporal lobe, seizures who presents with a breakthrough seizure.  She has had 2 recent admissions for breakthrough seizures, and family states that she has not quite returned to her baseline since the last episode of seizures. EEG to evaluate for seizure  Level of alertness: Awake, asleep  AEDs during EEG study: LEV, LCM, VPA  Technical aspects: This EEG study was done with scalp electrodes positioned according to the 10-20 International system of electrode placement. Electrical activity was reviewed with band pass filter of 1-70Hz , sensitivity of 7 uV/mm, display speed of 87mm/sec with a 60Hz  notched filter applied as appropriate. EEG data were recorded continuously and digitally stored.  Video monitoring was available and reviewed as appropriate.  Description:  EEG showed continuous generalized and lateralized left hemisphere 3 to 6 Hz theta-delta slowing admixed with 15 to 18 Hz beta activity distributed symmetrically and diffusely. Sleep was characterized by vertex waves, sleep spindles (12 to 14 Hz), maximal frontocentral region. Spikes were noted in left fronto-centro-temporal region. Hyperventilation and photic stimulation were not performed.      ABNORMALITY - Spikes, left fronto-centro-temporal region.  - Continuous slow, generalized  and lateralized left hemisphere   IMPRESSION: This study showed evidence of epileptogenicity arising from   left fronto-centro-temporal region. Additionally there is cortical dysfunction arising from left hemisphere likely secondary to underlying structural abnormality/ tumor. Additionally there is moderate diffuse encephalopathy. No seizures were seen throughout the recording.   Francenia Chimenti O  Jak Haggar

## 2024-06-11 NOTE — Progress Notes (Signed)
 MB performed maintenance on P8 and PZ electrodes. All impedances are below 10k ohms. No skin breakdown noted.

## 2024-06-11 NOTE — Progress Notes (Signed)
   This pt is current under hospice care at [facility]. I discussed with the daughter [name] transitioning to the Hospice facility for a focus of full comfort care and how this would look different from a hospital level of care. We discussed medications that the pt would get for seizure control based on my MD recommendations.   The daughter does not feel she is ready to make this decision today and needs time to process all of this. She feels that she may be ready in a day or so. She is still wanting to see if her mother wakes up and comes out of her post ictal phase.  I explained that we would follow up tomorrow and if she had any additional questions she could reach out to me.   Magdalena Berber RN [phone]

## 2024-06-11 NOTE — Progress Notes (Signed)
 Progress Note   Patient: Yvonne [name] FMW:[id number] DOB: 1946-03-23 DOA: 06/10/2024     1 DOS: the patient was seen and examined on 06/11/2024   Brief hospital course: 78 y.o. female with medical history significant of stage IV glioblastoma temporal lobe, diabetes, Graves' disease, diabetic neuropathy, OSA presents with recurrent seizures and significant decline in functional status.   She was recently discharged after a similar presentation on October 20th and has been under home hospice care since then. Her functional status has not returned to baseline, requiring significant assistance with daily activities such as getting out of bed, using the commode, and eating. She is unable to walk independently due to almost complete right-sided weakness. Her alertness is significantly reduced, with her being awake for only one to two hours daily.   On the morning of the visit, after taking her morning medications, including Depakote at 6 AM, she began experiencing twitching in her mouth around 7:15 AM, which progressed to facial twitching and vocal seizures. She was administered Keppra  and lacosamide  before she was unable to swallow. Neurology has been involved, and she has received multiple medications including Keppra , fosphenytoin , and Ativan  to manage her seizures.   Over the past five to six weeks, she has experienced a significant decline since the onset of her seizures. She is now mostly bedridden, with decreased oral intake and hydration, contributing to dehydration  Assessment and Plan: Breakthrough seizures Stage IV glioblastoma multiform Patient presents from home due to recurrent seizure activity despite being on Keppra , Vimpat , and Dilantin .   -Noted to have progressive decline PTA, no longer able to perform ADLs, and unable to tolerate PO intake or PO meds -CT noted similar heterogeneous mass in the left parietal lobe measuring 4.1 x 3.8 cm with mild edema and local mass effect.   Patient was given Ativan  in addition to antiepileptics. - Discussed with Neurology. No seizures noted on LTM. Concern that presenting symptoms are more associated with progressive metastatic disease, per Neuro -Family is very interested in residential hospice. Discussed with Hospice for placement to residential hospice -Neurology recommends continue current IV seizure regimen including Keppra  1500 mg twice daily, Vimpat  200 mg twice daily and valproic acid 250 mg every 8 hours  - Continue Decadron  4 mg IV every 6 hours -Given lack of adequate po intake and inability to take PO meds, anticipate prognosis <2 weeks   Transient hypotension Blood pressures were noted to be initially as low as 80/43 after patient had received IV antiepileptics.  Blood pressures improved with IV fluids.   Urinary tract infection Urinalysis noted moderate hemoglobin, large leukocytes, rare bacteria, and greater than 50 WBCs.  Urine culture was obtained. During last hospitalization urine culture resulted with positive yeast only and treated with 3 days of IV Rocephin. - Continued on empiric antibiotics of Rocephin   Leukocytosis WBC noted to be elevated up to 13.2.  Possibly secondary to above and/or steroids.   Diabetes mellitus type 2 On admission glucose noted to be 91.  Last hemoglobin A1c noted to be 7.7 when checked on 05/29/2024.  Blood sugars likely elevated due to continuous steroids. - Hypoglycemic protocols - CBGs every 6 hours with very sensitive SSI - Adjust regimen as deemed medically appropriate   Hypothyroidism - Resume Synthroid  once able  End of Life -Discussed hospice as per above   Subjective: Unable to assess given mentation  Physical Exam: Vitals:   06/10/24 2352 06/11/24 0417 06/11/24 0725 06/11/24 1241  BP: (!) 112/52 120/60 ROLLEN)  121/54 138/79  Pulse:   75 80  Resp:  14 16 17   Temp: 97.8 F (36.6 C) 98.1 F (36.7 C) 98.3 F (36.8 C) 98.8 F (37.1 C)  TempSrc: Axillary Axillary  Axillary Oral  SpO2: 95%  100% 95%   General exam: laying in bed, in nad Respiratory system: Normal respiratory effort, no wheezing Cardiovascular system: regular rate, s1, s2 Gastrointestinal system: Soft, nondistended, positive BS Central nervous system: CN2-12 grossly intact, strength intact Extremities: Perfused, no clubbing Skin: Normal skin turgor, no notable skin lesions seen Psychiatry: Unable to assess given mentation  Data Reviewed:  Labs reviewed: WBC 14.1, Hgb 12.9, Plts 109  Family Communication: Pt in room, family at bedside and over phone  Disposition: Status is: Inpatient Remains inpatient appropriate because: severity of illness  Planned Discharge Destination: Hospice     Author: Garnette Pelt, MD 06/11/2024 4:05 PM  For on call review [email or web address].

## 2024-06-11 NOTE — Progress Notes (Signed)
 Subjective: No acute events overnight.  No further seizures.  Family at bedside.  Patient does occasionally appear to wake up and hold family's hand.  ROS: Unable to obtain due to poor mental status  Examination  Vital signs in last 24 hours: Temp:  [97.8 F (36.6 C)-98.8 F (37.1 C)] 98.8 F (37.1 C) (10/30 1241) Pulse Rate:  [67-80] 80 (10/30 1241) Resp:  [14-18] 17 (10/30 1241) BP: (112-138)/(52-79) 138/79 (10/30 1241) SpO2:  [92 %-100 %] 95 % (10/30 1241)  General: lying in bed NAD Neuro: Asleep during most of the examination, did at times wake up and hold family members hand.  I did not do any further exam on patient as she is transitioning to inpatient hospice  Basic Metabolic Panel: Recent Labs  Lab 06/10/24 0920  NA 137  K 3.6  CL 98  CO2 25  GLUCOSE 91  BUN 20  CREATININE 0.59  CALCIUM  7.7*    CBC: Recent Labs  Lab 06/10/24 0920 06/11/24 0141  WBC 13.2* 14.1*  NEUTROABS  --  11.8*  HGB 13.9 12.9  HCT 42.3 39.9  MCV 77.2* 77.0*  PLT 127* 109*     Coagulation Studies: No results for input(s): LABPROT, INR in the last 72 hours.  Imaging personally reviewed  CT head without contrast 06/10/2024:1. Similar appearance of heterogeneous mass in the left parietal lobe measuring 4.1 x 3.8 cm, previously 4.3 x 3.9 cm, with mild edema and local mass effect including partial effacement of the atrium of the left lateral ventricle. Unchanged encephalomalacia in the left temporal lobe. No acute findings.  ASSESSMENT AND PLAN: 78 year old female with history of glioblastoma multiforme he who presented with breakthrough seizures.  Epilepsy with breakthrough seizure GBM - No seizures seen overnight.  DC LTM EEG - Continue current antiseizure medications.  Can be continued at same dose at hospice if possible.  Unfortunately patient had focal motor seizures at current dose therefore may not be comfortable/ideal to reduce the dose per my discussion with family -  There was also discussion that at hospice patient may be on Versed  drip which is reasonable as long as we use minimal possible dose to control seizures and keep her comfortable rather than for sedation - Discussed plan in detail with family at bedside - Discussed plan with Dr.Chiu via secure chat - Neurology will sign off.    I personally spent a total of 37 minutes in the care of the patient today including getting/reviewing separately obtained history, performing a medically appropriate exam/evaluation, counseling and educating, placing orders, referring and communicating with other health care professionals, documenting clinical information in the EHR, independently interpreting results, and coordinating care.           Arlin Krebs [facility] For questions after 5pm please refer to AMION to reach the Neurologist on call

## 2024-06-11 NOTE — Plan of Care (Signed)
  Problem: Health Behavior/Discharge Planning: Goal: Ability to manage health-related needs will improve Outcome: Progressing   Problem: Clinical Measurements: Goal: Will remain free from infection Outcome: Progressing   Problem: Activity: Goal: Risk for activity intolerance will decrease Outcome: Progressing   Problem: Coping: Goal: Level of anxiety will decrease Outcome: Progressing   Problem: Elimination: Goal: Will not experience complications related to urinary retention Outcome: Progressing   Problem: Elimination: Goal: Will not experience complications related to bowel motility Outcome: Progressing   Problem: Safety: Goal: Ability to remain free from injury will improve Outcome: Progressing   Problem: Skin Integrity: Goal: Risk for impaired skin integrity will decrease Outcome: Progressing

## 2024-06-12 DIAGNOSIS — G40901 Epilepsy, unspecified, not intractable, with status epilepticus: Secondary | ICD-10-CM | POA: Diagnosis not present

## 2024-06-12 LAB — GLUCOSE, CAPILLARY
Glucose-Capillary: 127 mg/dL — ABNORMAL HIGH (ref 70–99)
Glucose-Capillary: 127 mg/dL — ABNORMAL HIGH (ref 70–99)
Glucose-Capillary: 119 mg/dL — ABNORMAL HIGH (ref 70–99)

## 2024-06-12 MED ORDER — DEXAMETHASONE SODIUM PHOSPHATE 4 MG/ML IJ SOLN: 4.0000 mg | Freq: Four times a day (QID) | INTRAMUSCULAR | Status: DC

## 2024-06-12 MED ORDER — WHITE PETROLATUM EX OINT
TOPICAL_OINTMENT | CUTANEOUS | Status: DC | PRN
  Administered 2024-06-12: 1 via TOPICAL
  Filled 2024-06-12: qty 28.35

## 2024-06-12 MED ORDER — ACETAMINOPHEN 10 MG/ML IV SOLN
1000.0000 mg | Freq: Four times a day (QID) | INTRAVENOUS | Status: DC
  Administered 2024-06-12 (×2): 1000 mg via INTRAVENOUS
  Filled 2024-06-12 (×4): qty 100

## 2024-06-12 MED ORDER — BACITRACIN ZINC 500 UNIT/GM EX OINT: TOPICAL_OINTMENT | Freq: Two times a day (BID) | CUTANEOUS | Status: DC

## 2024-06-12 MED ORDER — LORAZEPAM 2 MG/ML IJ SOLN: 1.0000 mg | Freq: Once | INTRAMUSCULAR | Status: DC | PRN

## 2024-06-12 NOTE — TOC Transition Note (Signed)
 Transition of Care [facility]) - Discharge Note   Patient Details  Name: Yvonne [name] MRN: [id number] Date of Birth: June 09, 1946  [facility] CM/SW Contact:  Almarie CHRISTELLA Goodie, LCSW Phone Number: 06/12/2024, 4:32 PM   Clinical Narrative:   CSW updated by [facility] that a bed is available for hospice, daughter in agreement with transfer. Equipment delivered, transport arranged with [facility] for next available. No further inpatient care management needs at this time.    Final next level of care: Hospice Medical Facility Barriers to Discharge: Barriers Resolved   Patient Goals and CMS Choice            Discharge Placement                Patient to be transferred to facility by: [facility] Name of family member notified: Daughter Patient and family notified of of transfer: 06/12/24  Discharge Plan and Services Additional resources added to the After Visit Summary for                                       Social Drivers of Health (SDOH) Interventions SDOH Screenings   Food Insecurity: No Food Insecurity (06/10/2024)  Housing: Low Risk  (06/10/2024)  Transportation Needs: No Transportation Needs (05/07/2024)  Utilities: Not At Risk (06/10/2024)  Financial Resource Strain: Low Risk  (02/20/2024)   Received from [facility]  Social Connections: Patient Unable To Answer (05/29/2024)  Recent Concern: Social Connections - Moderately Isolated (05/07/2024)  Tobacco Use: Medium Risk (05/29/2024)     Readmission Risk Interventions     No data to display

## 2024-06-12 NOTE — Discharge Summary (Signed)
 Physician Discharge Summary   Patient: Yvonne [name] MRN: [id number] DOB: Sep 23, 1945  Admit date:     06/10/2024  Discharge date: 06/12/24  Discharge Physician: Garnette Pelt   PCP: Cleotilde Planas, MD   Recommendations at discharge:    Follow up with hospice services  Discharge Diagnoses: Principal Problem:   Seizure (HCC) Active Problems:   Glioblastoma, IDH-wildtype (HCC)   Transient hypotension   Urinary tract infection   Leukocytosis   DM2 (diabetes mellitus, type 2) (HCC)   Hypothyroidism  Resolved Problems:   * No resolved hospital problems. *  Hospital Course: 78 y.o. female with medical history significant of stage IV glioblastoma temporal lobe, diabetes, Graves' disease, diabetic neuropathy, OSA presents with recurrent seizures and significant decline in functional status.   She was recently discharged after a similar presentation on October 20th and has been under home hospice care since then. Her functional status has not returned to baseline, requiring significant assistance with daily activities such as getting out of bed, using the commode, and eating. She is unable to walk independently due to almost complete right-sided weakness. Her alertness is significantly reduced, with her being awake for only one to two hours daily.   On the morning of the visit, after taking her morning medications, including Depakote at 6 AM, she began experiencing twitching in her mouth around 7:15 AM, which progressed to facial twitching and vocal seizures. She was administered Keppra  and lacosamide  before she was unable to swallow. Neurology has been involved, and she has received multiple medications including Keppra , fosphenytoin , and Ativan  to manage her seizures.   Over the past five to six weeks, she has experienced a significant decline since the onset of her seizures. She is now mostly bedridden, with decreased oral intake and hydration, contributing to dehydration  Assessment and  Plan: Breakthrough seizures Stage IV glioblastoma multiform Patient presents from home due to recurrent seizure activity despite being on Keppra , Vimpat , and Dilantin .   -Noted to have progressive decline PTA, no longer able to perform ADLs, and unable to tolerate PO intake or PO meds -CT noted similar heterogeneous mass in the left parietal lobe measuring 4.1 x 3.8 cm with mild edema and local mass effect.  Patient was given Ativan  in addition to antiepileptics. - Discussed with Neurology. No seizures noted on LTM. Concern that presenting symptoms are more associated with progressive metastatic disease, per Neuro -Family is very interested in residential hospice. Discussed with Hospice for placement to residential hospice -Neurology recommended continue current IV seizure regimen including Keppra  1500 mg twice daily, Vimpat  200 mg twice daily and valproic acid 250 mg every 8 hours. Given difficulty acquiring certain medications in residential hospice, plan is for versed  gtt when discharged to residential hospice. Neuro agreed with this - Continue Decadron  4 mg IV every 6 hours -Given lack of adequate po intake and inability to take PO meds, anticipate prognosis <2 weeks   Transient hypotension Blood pressures were noted to be initially as low as 80/43 after patient had received IV antiepileptics.  Blood pressures improved with IV fluids.   Urinary tract infection Urinalysis noted moderate hemoglobin, large leukocytes, rare bacteria, and greater than 50 WBCs.  Urine culture was obtained. During last hospitalization urine culture resulted with positive yeast only and treated with 3 days of IV Rocephin. - Given empiric rocephin this visit   Leukocytosis WBC noted to be elevated up to 13.2.  Possibly secondary to above and/or steroids.   Diabetes mellitus type 2 On admission  glucose noted to be 91.  Last hemoglobin A1c noted to be 7.7 when checked on 05/29/2024.  Blood sugars likely elevated due  to continuous steroids.   Hypothyroidism   End of Life -Discussed hospice as per above    Consultants: Hospice Procedures performed:   Disposition: Hospice care Diet recommendation:  Comfort feed as tolerated  DISCHARGE MEDICATION: Allergies as of 06/12/2024       Reactions   Tobramycin Hives   Sulfa Antibiotics Other (See Comments)   Stomach upset   Erythromycin Nausea Only        Medication List     STOP taking these medications    dexamethasone  4 MG tablet Commonly known as: DECADRON    divalproex 250 MG DR tablet Commonly known as: DEPAKOTE   fluticasone  50 MCG/ACT nasal spray Commonly known as: FLONASE    gabapentin  300 MG capsule Commonly known as: NEURONTIN    Jardiance 10 MG Tabs tablet Generic drug: empagliflozin   lacosamide  200 MG Tabs tablet Commonly known as: VIMPAT    levETIRAcetam  750 MG tablet Commonly known as: KEPPRA    levothyroxine  88 MCG tablet Commonly known as: SYNTHROID    LORazepam  1 MG tablet Commonly known as: ATIVAN  Replaced by: LORazepam  2 MG/ML injection   metFORMIN 500 MG tablet Commonly known as: GLUCOPHAGE   metoprolol  succinate 100 MG 24 hr tablet Commonly known as: TOPROL -XL   Milk of Magnesia 400 MG/5ML suspension Generic drug: magnesium hydroxide   mirtazapine 7.5 MG tablet Commonly known as: REMERON   morphine CONCENTRATE 10 mg / 0.5 ml concentrated solution   omeprazole 40 MG capsule Commonly known as: PRILOSEC   ondansetron  8 MG tablet Commonly known as: ZOFRAN    prochlorperazine  10 MG tablet Commonly known as: COMPAZINE    rosuvastatin  5 MG tablet Commonly known as: CRESTOR    Simethicone 125 MG Caps   Stool Softener/Laxative 50-8.6 MG tablet Generic drug: senna-docusate       TAKE these medications    acetaminophen  500 MG tablet Commonly known as: TYLENOL  Take 1,000 mg by mouth daily as needed for moderate pain (pain score 4-6).   bacitracin ointment Apply topically 2 (two) times  daily.   clonazePAM 0.5 MG disintegrating tablet Commonly known as: KLONOPIN Take 1 tablet (0.5 mg total) by mouth as needed for seizure (as needed for seizure lasting for more than 5 minutes.).   dexamethasone  4 MG/ML injection Commonly known as: DECADRON  Inject 1 mL (4 mg total) into the vein every 6 (six) hours.   hydrocortisone cream 1 % Apply 1 Application topically.   LORazepam  2 MG/ML injection Commonly known as: ATIVAN  Inject 0.5 mLs (1 mg total) into the vein once as needed for seizure (for nausea). Replaces: LORazepam  1 MG tablet        Follow-up Information     Follow up with hospice service Follow up.   Why: As needed               Discharge Exam: There were no vitals filed for this visit. General exam: Awake, laying in bed, in nad Respiratory system: Normal respiratory effort, no wheezing Cardiovascular system: regular rate, s1, s2 Gastrointestinal system: Soft, nondistended Central nervous system: No active seizure or tremor Extremities: Perfused, no clubbing Skin: Normal skin turgor, no notable skin lesions seen Psychiatry: difficult to assess given mentation   Condition at discharge: poor  The results of significant diagnostics from this hospitalization (including imaging, microbiology, ancillary and laboratory) are listed below for reference.   Imaging Studies: Overnight EEG with video Result  Date: 06/11/2024 Shelton Arlin KIDD, MD     06/11/2024  5:05 PM Patient Name: KENNI NEWTON MRN: [id number] Epilepsy Attending: Arlin KIDD Shelton Referring Physician/Provider: everitt Clint Abbey Earle FORBES, NP Duration: 06/10/2024 1431 to 06/11/2024 1457 Patient history: 78 y.o. female with hx of stage IV glioblastoma in the left temporal lobe, seizures who presents with a breakthrough seizure.  She has had 2 recent admissions for breakthrough seizures, and family states that she has not quite returned to her baseline since the last episode of seizures. EEG to  evaluate for seizure Level of alertness: Awake, asleep AEDs during EEG study: LEV, LCM, VPA Technical aspects: This EEG study was done with scalp electrodes positioned according to the 10-20 International system of electrode placement. Electrical activity was reviewed with band pass filter of 1-70Hz , sensitivity of 7 uV/mm, display speed of 64mm/sec with a 60Hz  notched filter applied as appropriate. EEG data were recorded continuously and digitally stored.  Video monitoring was available and reviewed as appropriate. Description:  EEG showed continuous generalized and lateralized left hemisphere 3 to 6 Hz theta-delta slowing admixed with 15 to 18 Hz beta activity distributed symmetrically and diffusely. Sleep was characterized by vertex waves, sleep spindles (12 to 14 Hz), maximal frontocentral region. Spikes were noted in left fronto-centro-temporal region. Hyperventilation and photic stimulation were not performed.    ABNORMALITY - Spikes, left fronto-centro-temporal region. - Continuous slow, generalized  and lateralized left hemisphere  IMPRESSION: This study showed evidence of epileptogenicity arising from   left fronto-centro-temporal region. Additionally there is cortical dysfunction arising from left hemisphere likely secondary to underlying structural abnormality/ tumor. Additionally there is moderate diffuse encephalopathy. No seizures were seen throughout the recording.  Priyanka O Yadav   CT Head Wo Contrast Result Date: 06/10/2024 EXAM: CT HEAD WITHOUT CONTRAST 06/10/2024 11:02:07 AM TECHNIQUE: CT of the head was performed without the administration of intravenous contrast. Automated exposure control, iterative reconstruction, and/or weight based adjustment of the mA/kV was utilized to reduce the radiation dose to as low as reasonably achievable. COMPARISON: 05/29/2024 CLINICAL HISTORY: FINDINGS: BRAIN AND VENTRICLES: No acute hemorrhage. No evidence of acute infarct. No hydrocephalus. No extra-axial  collection. There is a somewhat ill-defined heterogeneous mass in the left parietal lobe which measures 4.1 x 3.8 cm on the current study, previously measuring 4.3 x 3.9 cm when measured in a similar manner. Internal areas of decreased density within the mass are similar to prior. There is mild associated edema. Local mass effect with partial effacement of the atrium of the left lateral ventricle is noted. Unchanged encephalomalacia in the left temporal lobe. Redemonstrated left middle cranial fossa arachnoid cyst. No midline shift. ORBITS: No acute abnormality. SINUSES: Left mastoid effusion. Mucosal thickening in bilateral sphenoid and maxillary sinuses. SOFT TISSUES AND SKULL: Prior left pterional craniotomy. No acute soft tissue abnormality. No skull fracture. IMPRESSION: 1. Similar appearance of heterogeneous mass in the left parietal lobe measuring 4.1 x 3.8 cm, previously 4.3 x 3.9 cm, with mild edema and local mass effect including partial effacement of the atrium of the left lateral ventricle. 2. Unchanged encephalomalacia in the left temporal lobe. 3. No acute findings. Electronically signed by: Donnice Mania MD 06/10/2024 11:42 AM EDT RP Workstation: [id number]   DG Chest Portable 1 View Result Date: 05/29/2024 EXAM: 1 VIEW(S) XRAY OF THE CHEST 05/29/2024 11:32:00 AM COMPARISON: 09/27/2017 CLINICAL HISTORY: cough. Reason for exam: cough ; Per triage notes: Patient presents to the ER via EMS from home with seizures. Patient has a  hx of seizure and is on Keppra  at home. Patient normally awake, alert and orientated. Patient responsive to pain due to the 5mg  Versed  per EMS. Patient is a ; DNR. FINDINGS: LUNGS AND PLEURA: No focal pulmonary opacity. No pulmonary edema. No pleural effusion. No pneumothorax. HEART AND MEDIASTINUM: No acute abnormality of the cardiac and mediastinal silhouettes. BONES AND SOFT TISSUES: No acute osseous abnormality. IMPRESSION: 1. No acute cardiopulmonary abnormality.  Electronically signed by: Lynwood Seip MD 05/29/2024 11:51 AM EDT RP Workstation: [id number]   CT Head Wo Contrast Result Date: 05/29/2024 EXAM: CT HEAD WITHOUT CONTRAST 05/29/2024 11:21:00 AM TECHNIQUE: CT of the head was performed without the administration of intravenous contrast. Automated exposure control, iterative reconstruction, and/or weight based adjustment of the mA/kV was utilized to reduce the radiation dose to as low as reasonably achievable. COMPARISON: CT of the head dated 05/06/2024. CLINICAL HISTORY: Seizure disorder, clinical change. Chief complaints; Seizures. FINDINGS: BRAIN AND VENTRICLES: No acute hemorrhage. No evidence of acute infarct. No hydrocephalus. No extra-axial collection. No mass effect or midline shift. Mixed attenuation lesion within the left parietal lobe has mildly increased in size in the interim from approximately 3.9 x 3.4 cm to approximately 4.3 x 3.9 cm. It has decreased in density in the interim along its anteromedial margin. There are chronic encephalomalacia changes again demonstrated within the left temporal lobe and an overlying craniotomy. ORBITS: No acute abnormality. SINUSES: Moderate opacification of the sphenoid sinus. Mild opacification within the maxillary sinuses bilaterally. SOFT TISSUES AND SKULL: No acute soft tissue abnormality. No skull fracture. Overlying craniotomy. IMPRESSION: 1. Mixed attenuation lesion within the left parietal lobe has mildly increased in size in the interim from approximately 3.9 x 3.4 cm to approximately 4.3 x 3.9 cm, with decreased density along its anteromedial margin, presumably secondary to focal infarct/necrosis . 2. Chronic encephalomalacia changes within the left temporal lobe and an overlying craniotomy. Electronically signed by: Evalene Coho MD 05/29/2024 11:39 AM EDT RP Workstation: [id number]    Microbiology: Results for orders placed or performed during the hospital encounter of 06/10/24  Urine Culture      Status: None   Collection Time: 06/10/24  8:38 AM   Specimen: Urine, Clean Catch  Result Value Ref Range Status   Specimen Description URINE, CLEAN CATCH  Final   Special Requests NONE  Final   Culture   Final    NO GROWTH Performed at [facility], [address], [place], [place] [address]    Report Status 06/11/2024 FINAL  Final    Labs: CBC: Recent Labs  Lab 06/10/24 0920 06/11/24 0141  WBC 13.2* 14.1*  NEUTROABS  --  11.8*  HGB 13.9 12.9  HCT 42.3 39.9  MCV 77.2* 77.0*  PLT 127* 109*   Basic Metabolic Panel: Recent Labs  Lab 06/10/24 0920  NA 137  K 3.6  CL 98  CO2 25  GLUCOSE 91  BUN 20  CREATININE 0.59  CALCIUM  7.7*   Liver Function Tests: Recent Labs  Lab 06/10/24 1337  AST 10*  ALT 13  ALKPHOS 31*  BILITOT 0.6  PROT 4.2*  ALBUMIN 2.0*   CBG: Recent Labs  Lab 06/11/24 0601 06/11/24 1542 06/11/24 2223 06/12/24 0102 06/12/24 0654  GLUCAP 122* 123* 123* 127* 127*    Discharge time spent: less than 30 minutes.  Signed: Garnette Pelt, MD [facility] 06/12/2024

## 2024-06-12 NOTE — Progress Notes (Signed)
 Patient discharge to Hospice by the [facility] via [facility].  Report called to RN receiving patient.  General condition improved slightly, responsive to pain and answering simple question.  Iv tylenol  given per schedule for pain.  Telemetry and IV fluid discontinued,  IV left inplace saline lock. Chronic foley inplace.  Vital signs stable,  not in respiratory distress at time of transfer.

## 2024-07-13 DEATH — deceased
